# Patient Record
Sex: Female | Born: 1935
Health system: Southern US, Community
[De-identification: ages and names within clinical notes are randomized; demographics above are authoritative.]

## PROBLEM LIST (undated history)

## (undated) DIAGNOSIS — I10 Essential (primary) hypertension: Secondary | ICD-10-CM

## (undated) DIAGNOSIS — I639 Cerebral infarction, unspecified: Secondary | ICD-10-CM

## (undated) DIAGNOSIS — E079 Disorder of thyroid, unspecified: Secondary | ICD-10-CM

---

## 1998-03-30 ENCOUNTER — Other Ambulatory Visit: Admission: RE | Admit: 1998-03-30 | Discharge: 1998-03-30 | Payer: Self-pay | Admitting: Internal Medicine

## 1998-05-13 ENCOUNTER — Ambulatory Visit (HOSPITAL_COMMUNITY): Admission: RE | Admit: 1998-05-13 | Discharge: 1998-05-13 | Payer: Self-pay | Admitting: Ophthalmology

## 1999-08-18 ENCOUNTER — Encounter: Admission: RE | Admit: 1999-08-18 | Discharge: 1999-08-18 | Payer: Self-pay | Admitting: Internal Medicine

## 1999-09-01 ENCOUNTER — Encounter: Admission: RE | Admit: 1999-09-01 | Discharge: 1999-09-01 | Payer: Self-pay | Admitting: Internal Medicine

## 2000-04-24 ENCOUNTER — Other Ambulatory Visit: Admission: RE | Admit: 2000-04-24 | Discharge: 2000-04-24 | Payer: Self-pay | Admitting: Internal Medicine

## 2000-08-19 ENCOUNTER — Encounter: Admission: RE | Admit: 2000-08-19 | Discharge: 2000-08-19 | Payer: Self-pay | Admitting: Internal Medicine

## 2001-09-05 ENCOUNTER — Encounter: Payer: Self-pay | Admitting: Internal Medicine

## 2001-09-05 ENCOUNTER — Encounter: Admission: RE | Admit: 2001-09-05 | Discharge: 2001-09-05 | Payer: Self-pay | Admitting: Internal Medicine

## 2002-09-08 ENCOUNTER — Encounter: Admission: RE | Admit: 2002-09-08 | Discharge: 2002-09-08 | Payer: Self-pay | Admitting: Internal Medicine

## 2003-09-28 ENCOUNTER — Encounter: Admission: RE | Admit: 2003-09-28 | Discharge: 2003-09-28 | Payer: Self-pay | Admitting: Internal Medicine

## 2004-11-17 ENCOUNTER — Encounter: Admission: RE | Admit: 2004-11-17 | Discharge: 2004-11-17 | Payer: Self-pay | Admitting: Internal Medicine

## 2005-12-21 ENCOUNTER — Encounter: Admission: RE | Admit: 2005-12-21 | Discharge: 2005-12-21 | Payer: Self-pay | Admitting: Internal Medicine

## 2005-12-21 IMAGING — MG MM MAMMO SCREENING
5 series · 5 of 5 positions shown · non-contrast
Comparison: none

SCREENING MAMMOGRAM:
There is a fibroglandular pattern.  No masses or malignant type calcifications are identified.  
Compared with prior studies.

[R CC (1 of 2)]
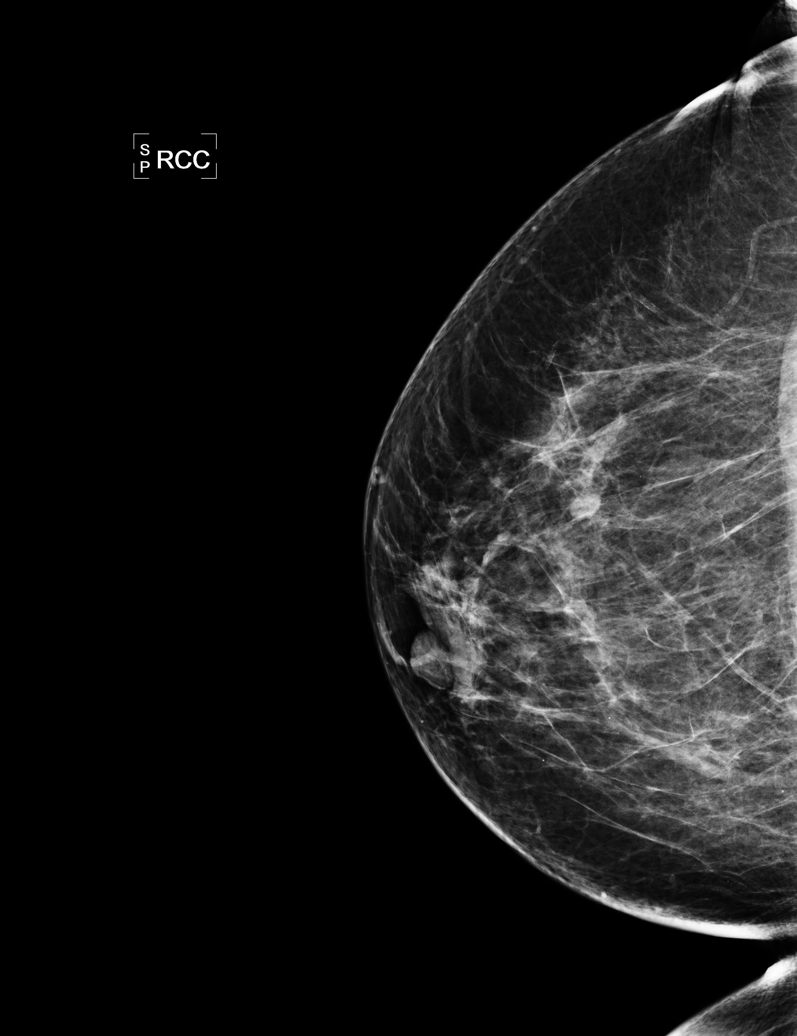

[L CC]
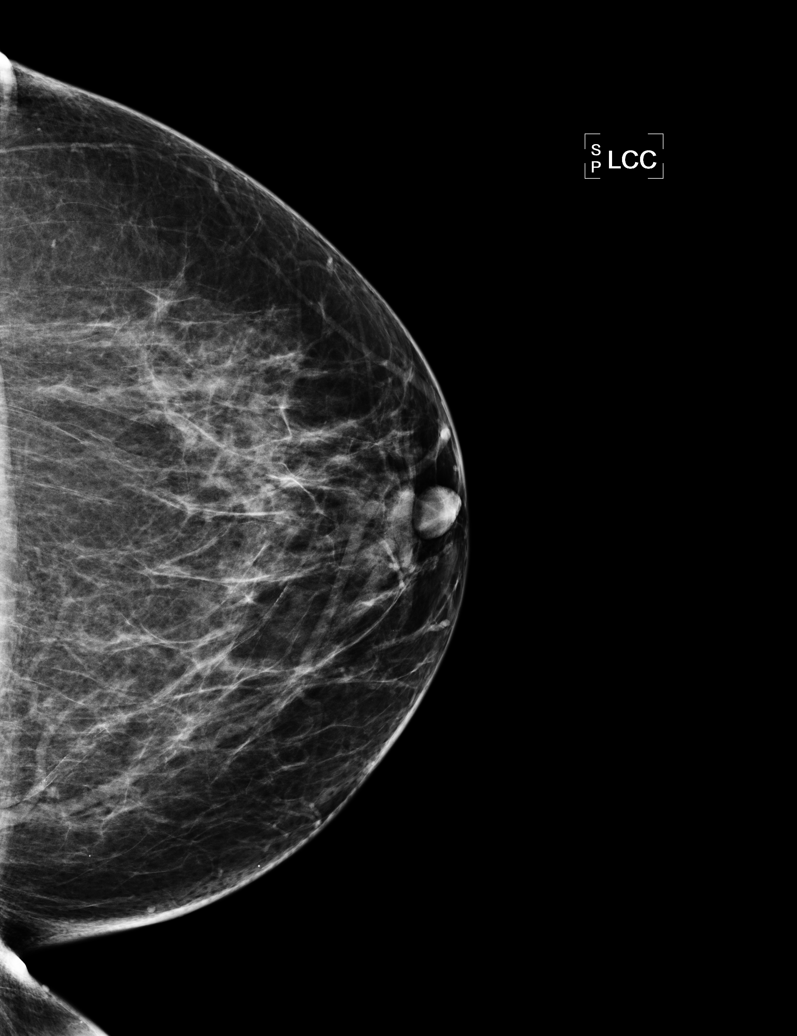

[L MLO]
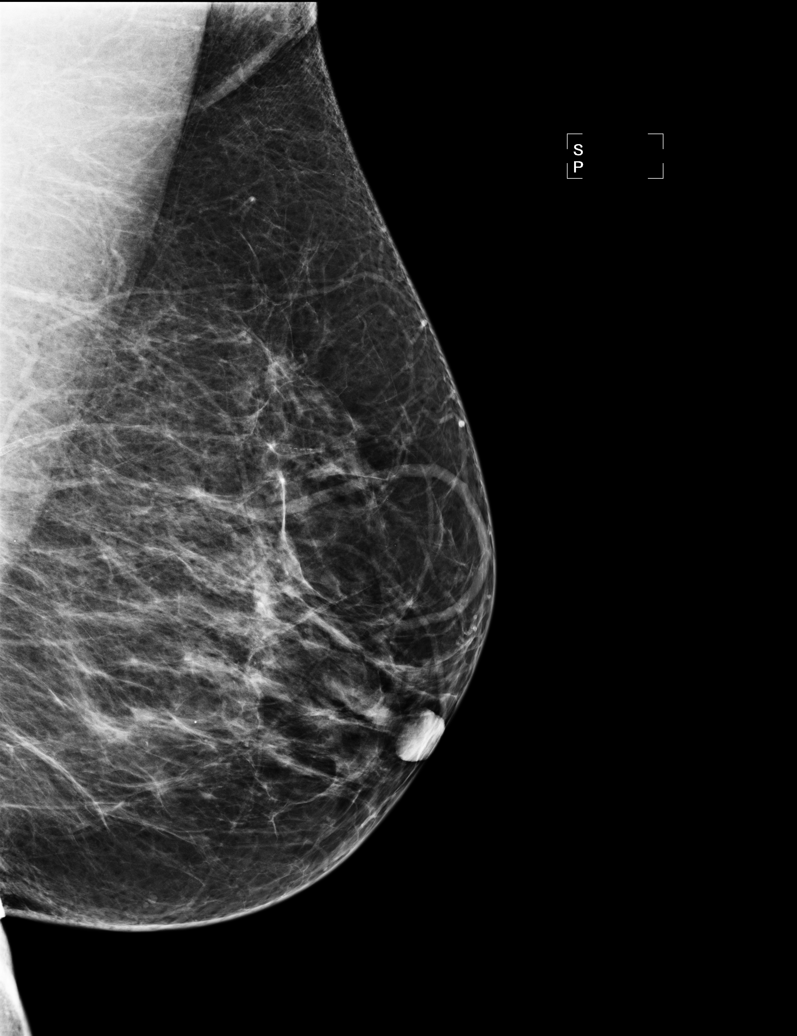

[R MLO]
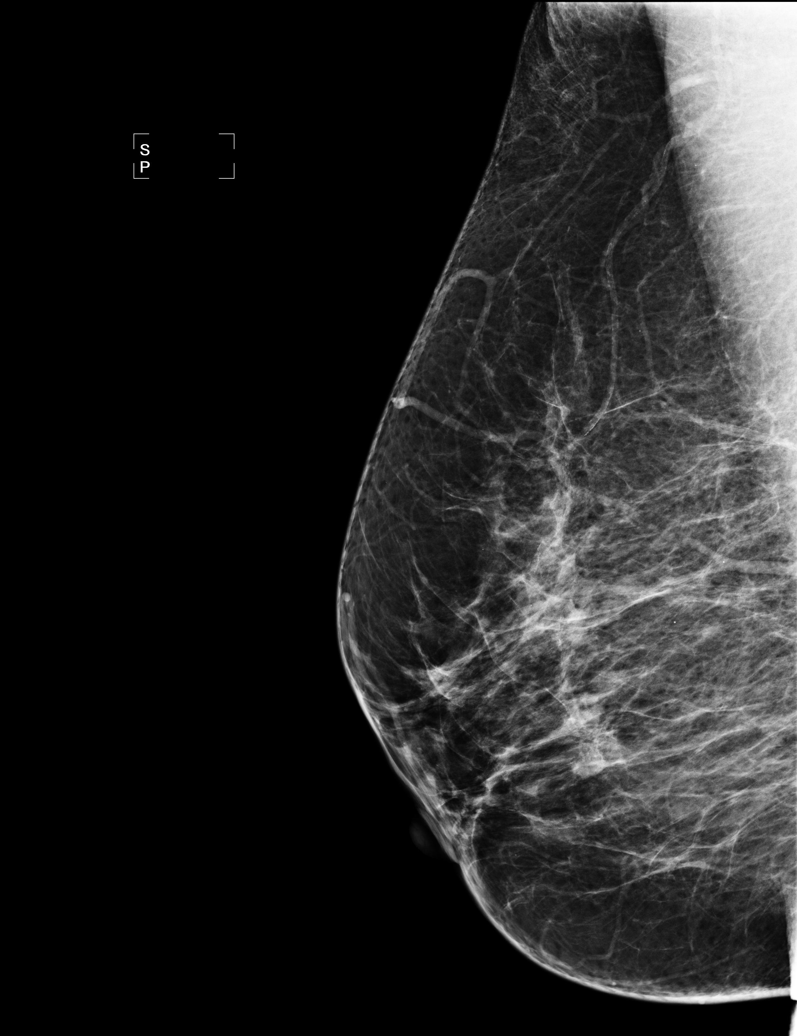

[R CC (2 of 2)]
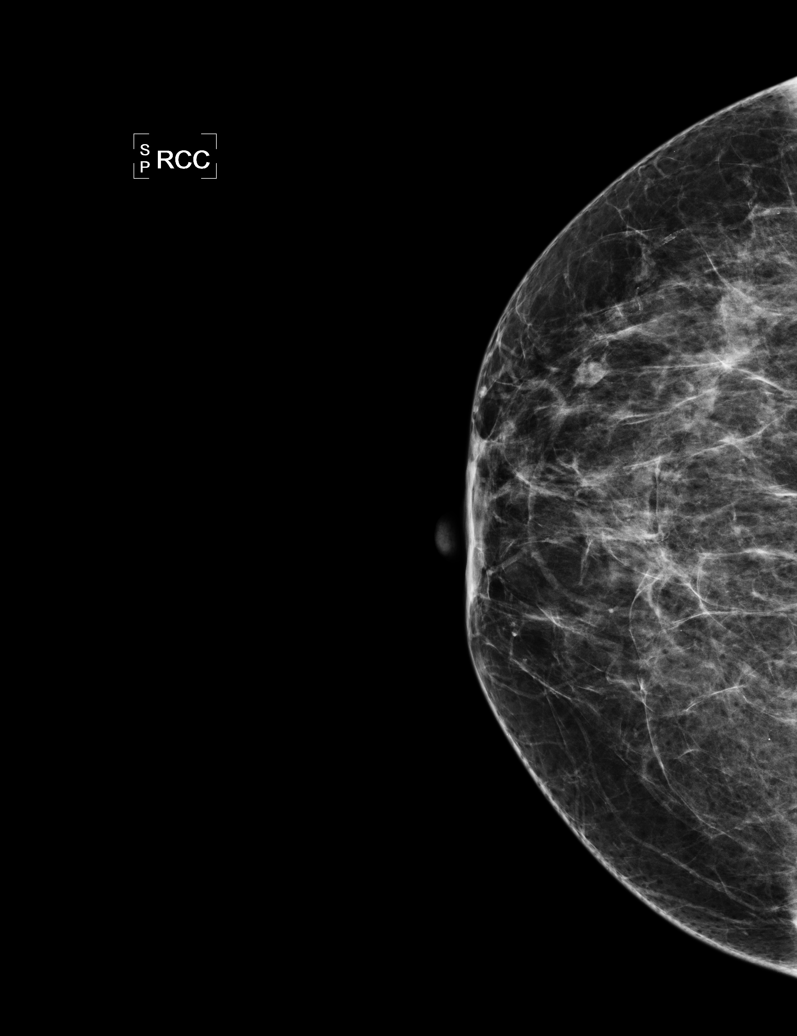

[5 of 5 positions shown; findings below may reference images not displayed]

IMPRESSION: No specific mammographic evidence of malignancy.  Next screening mammogram is recommended in one 
year.

ASSESSMENT: Negative - BI-RADS 1

Screening mammogram in 1 year.

## 2007-01-10 ENCOUNTER — Encounter: Admission: RE | Admit: 2007-01-10 | Discharge: 2007-01-10 | Payer: Self-pay | Admitting: Internal Medicine

## 2007-01-10 IMAGING — MG MM SCREEN MAMMOGRAM BILATERAL
6 series · 6 of 6 positions shown · non-contrast
Comparison: none

DG SCREEN MAMMOGRAM BILATERAL
Bilateral CC and MLO view(s) were taken.
Technologist: NOEMIE

DIGITAL SCREENING MAMMOGRAM WITH CAD:
There is a fibroglandular pattern.  No masses or malignant type calcifications are identified.  
Compared with prior studies.

[R CC]
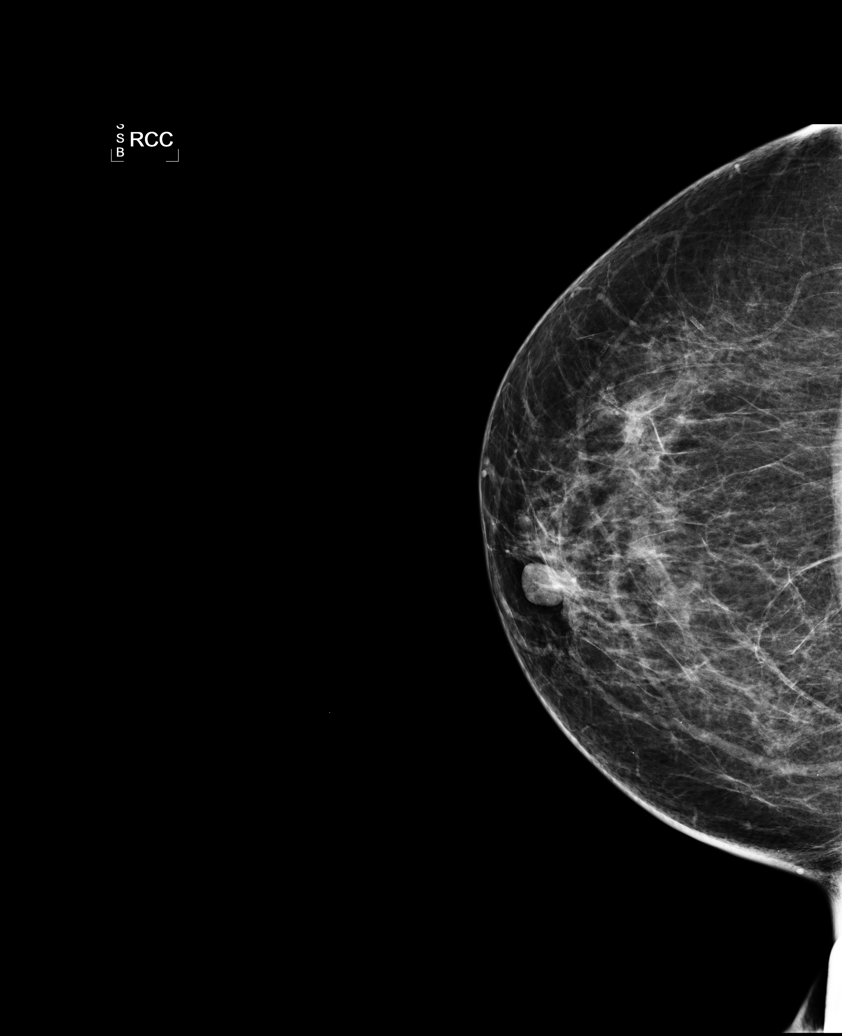

[L CC]
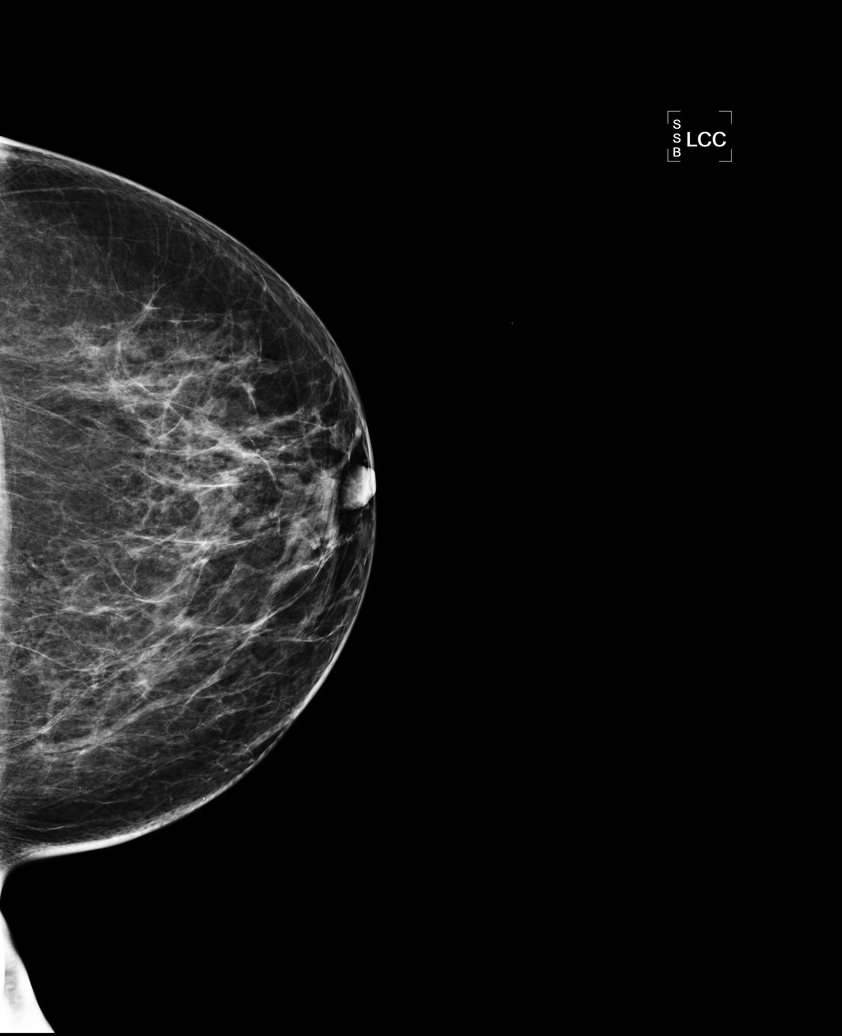

[L MLO (1 of 2)]
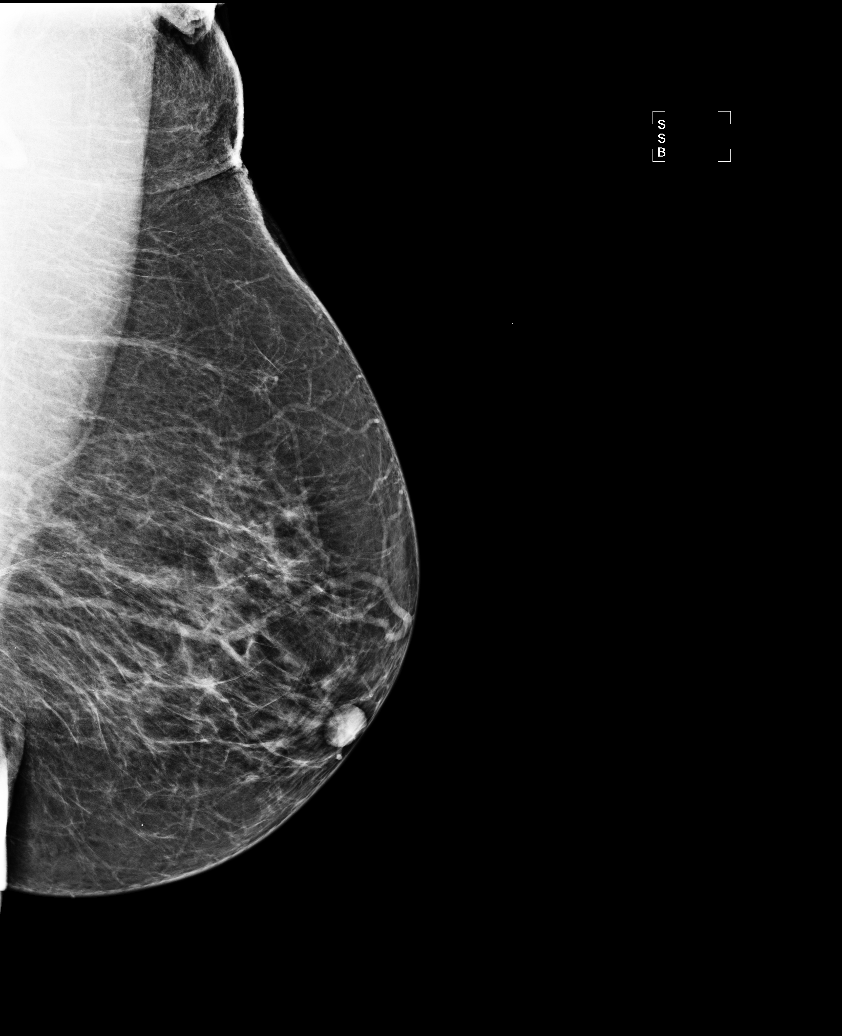

[R MLO (1 of 2)]
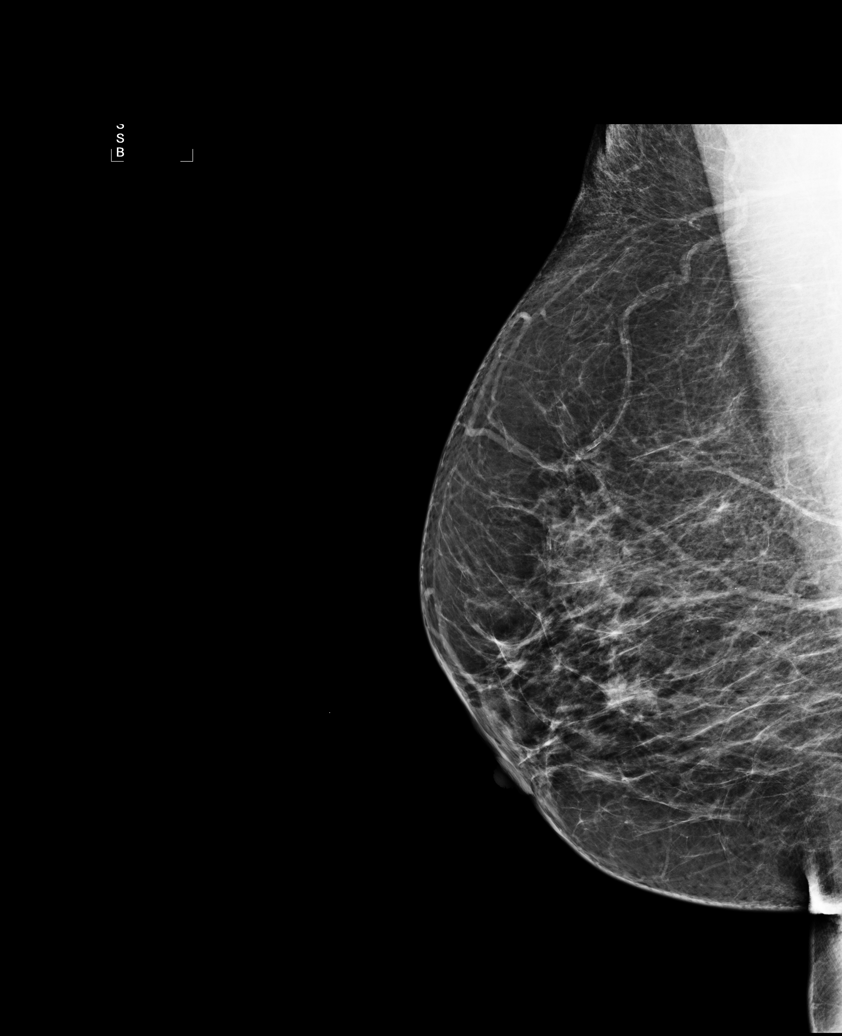

[L MLO (2 of 2)]
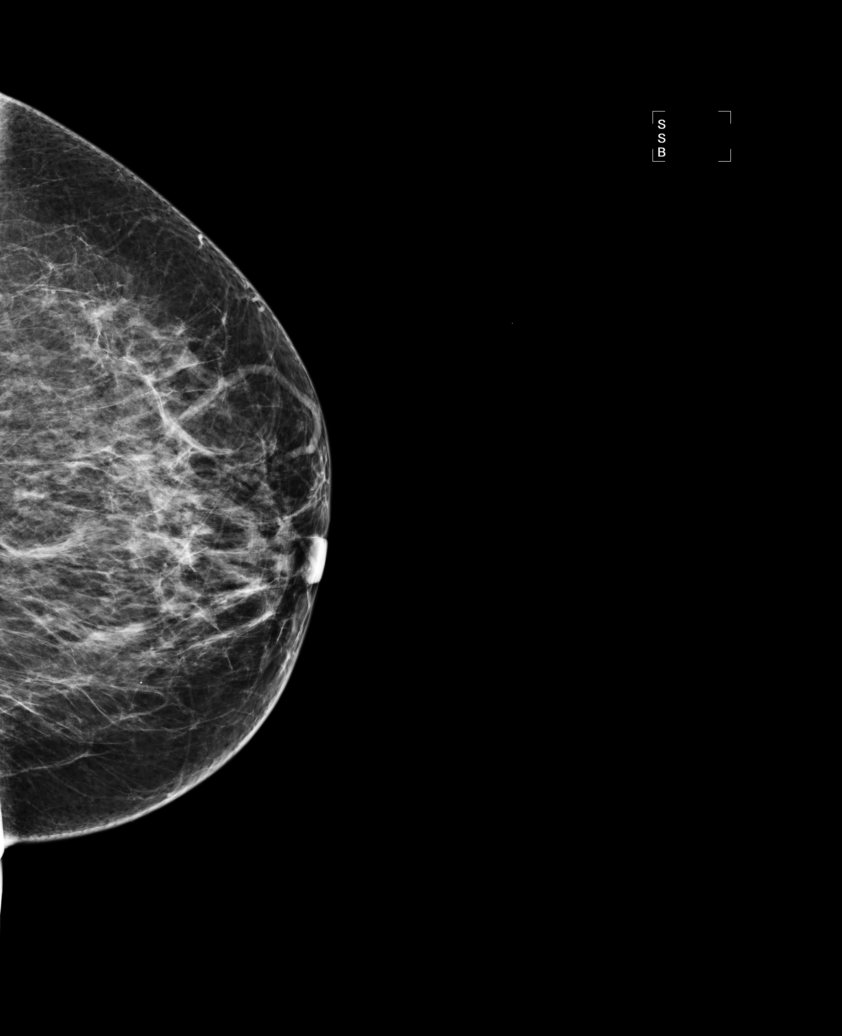

[R MLO (2 of 2)]
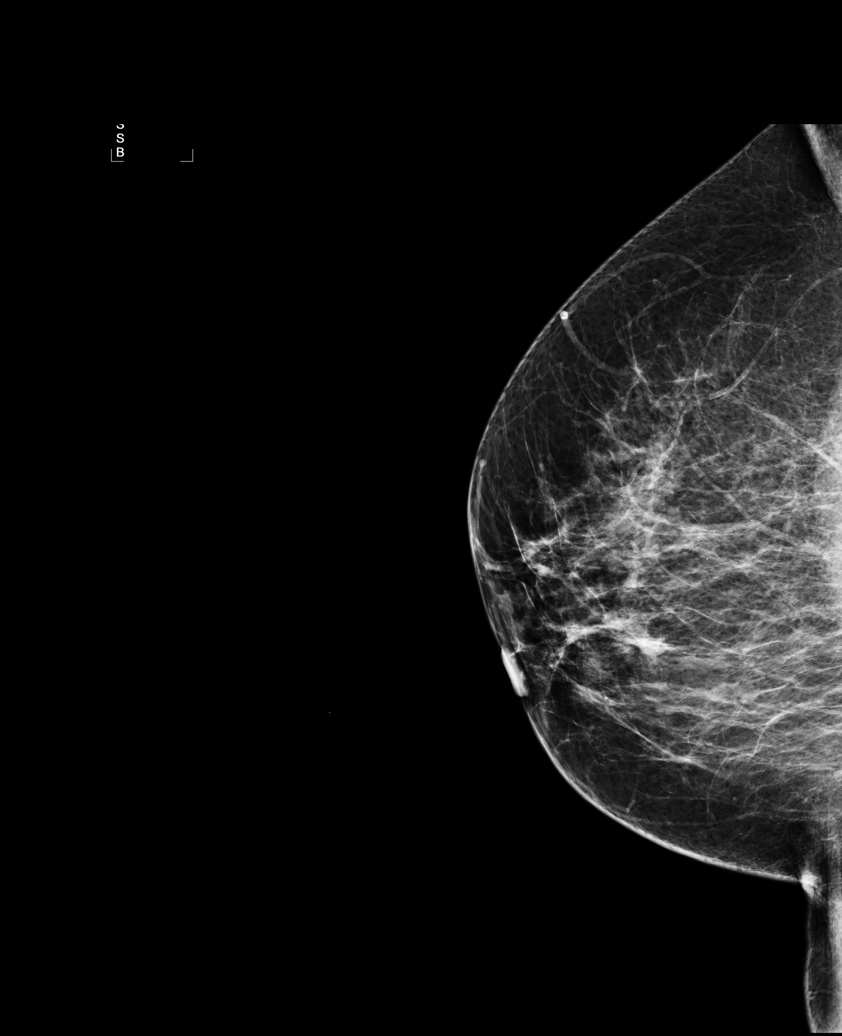

[6 of 6 positions shown; findings below may reference images not displayed]

IMPRESSION: No specific mammographic evidence of malignancy.  Next screening mammogram is recommended in one 
year.

ASSESSMENT: Negative - BI-RADS 1

Screening mammogram in 1 year.
ANALYZED BY COMPUTER AIDED DETECTION. , THIS PROCEDURE WAS A DIGITAL MAMMOGRAM.

## 2007-07-07 ENCOUNTER — Emergency Department (HOSPITAL_COMMUNITY): Admission: EM | Admit: 2007-07-07 | Discharge: 2007-07-07 | Payer: Self-pay | Admitting: Emergency Medicine
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
## 2007-09-01 ENCOUNTER — Encounter: Admission: RE | Admit: 2007-09-01 | Discharge: 2007-11-30 | Payer: Self-pay | Admitting: Orthopedic Surgery

## 2008-01-23 ENCOUNTER — Encounter: Admission: RE | Admit: 2008-01-23 | Discharge: 2008-01-23 | Payer: Self-pay | Admitting: Internal Medicine

## 2008-01-23 IMAGING — MG MM SCREEN MAMMOGRAM BILATERAL
4 series · 4 of 4 positions shown · non-contrast
Comparison: Prior studies.

DG SCREEN MAMMOGRAM BILATERAL
Bilateral CC and MLO view(s) were taken.

DIGITAL SCREENING MAMMOGRAM WITH CAD:

[R CC]
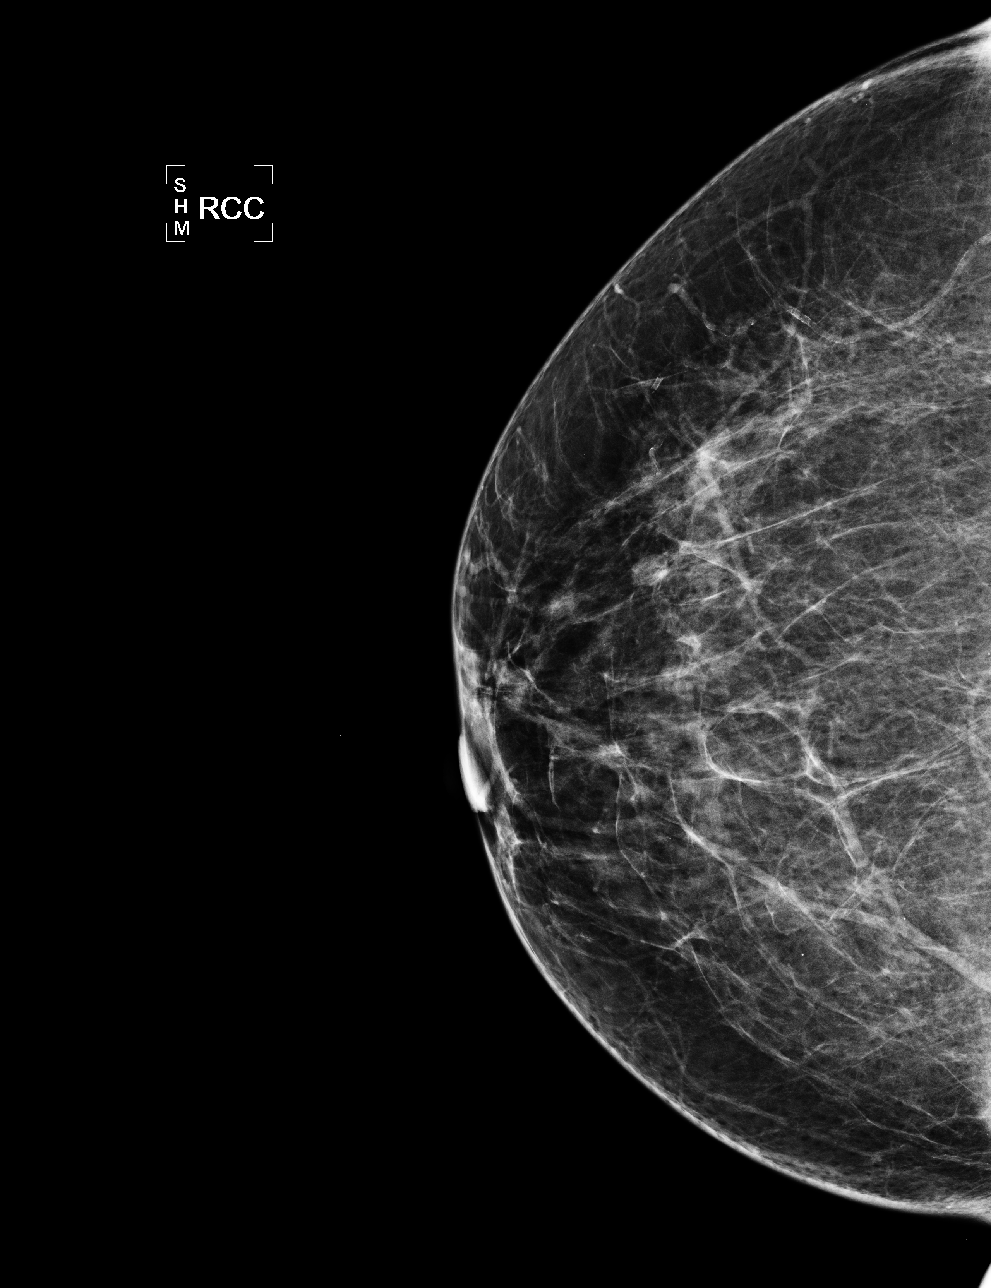

[L CC]
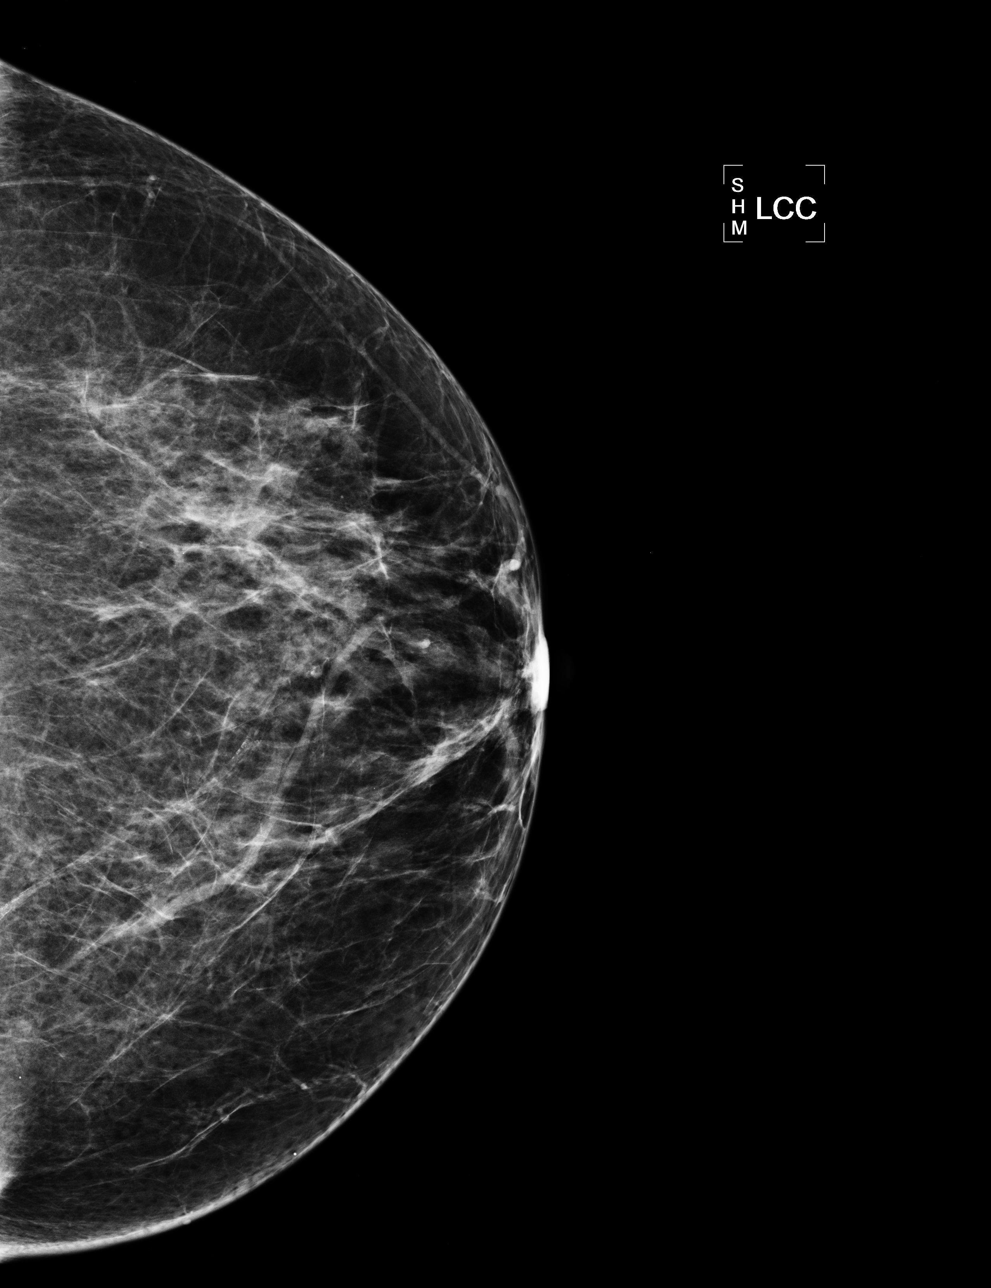

[L MLO]
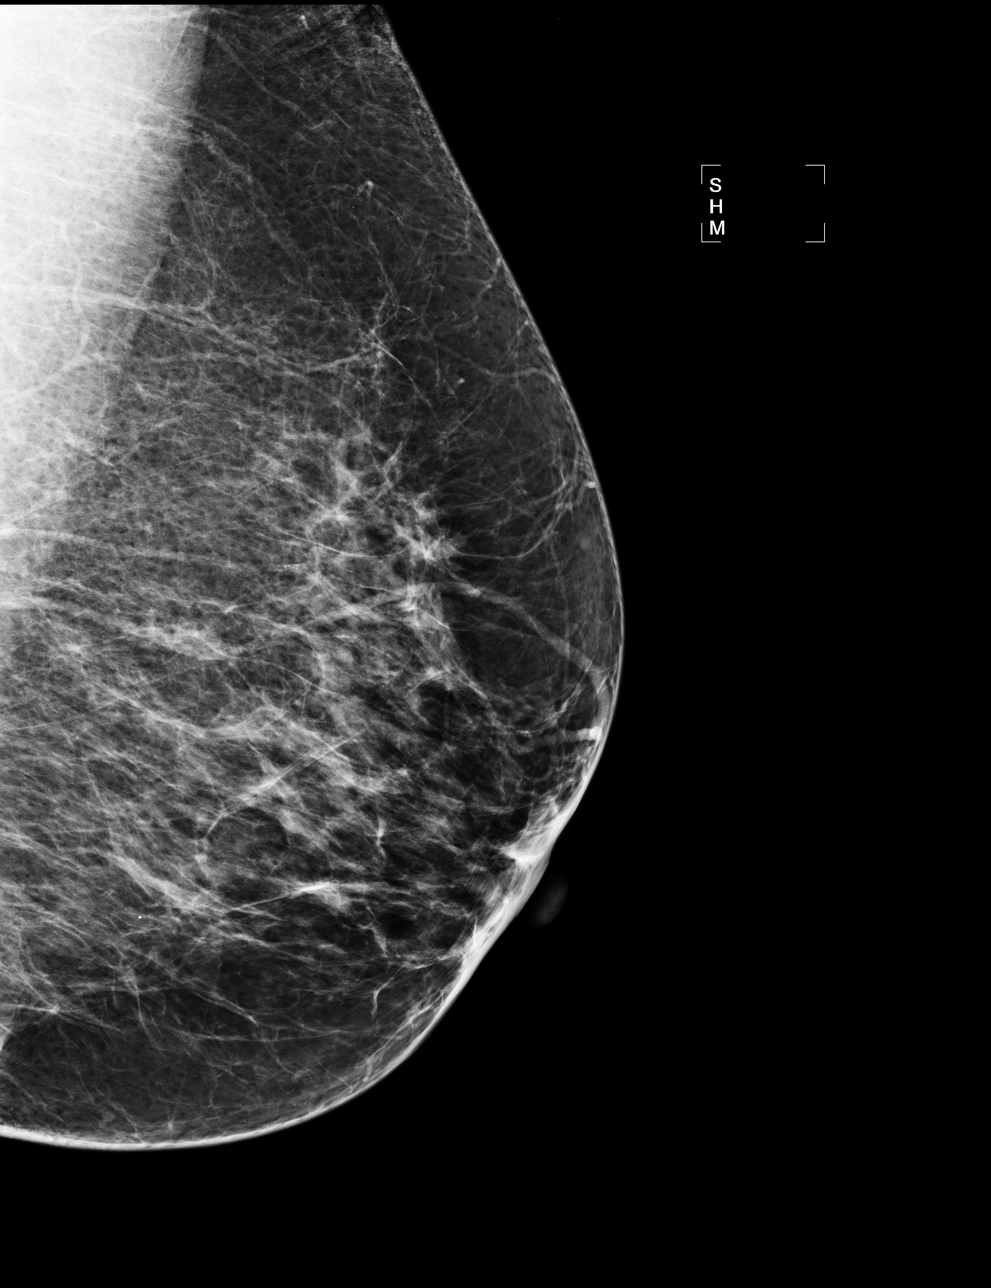

[R MLO]
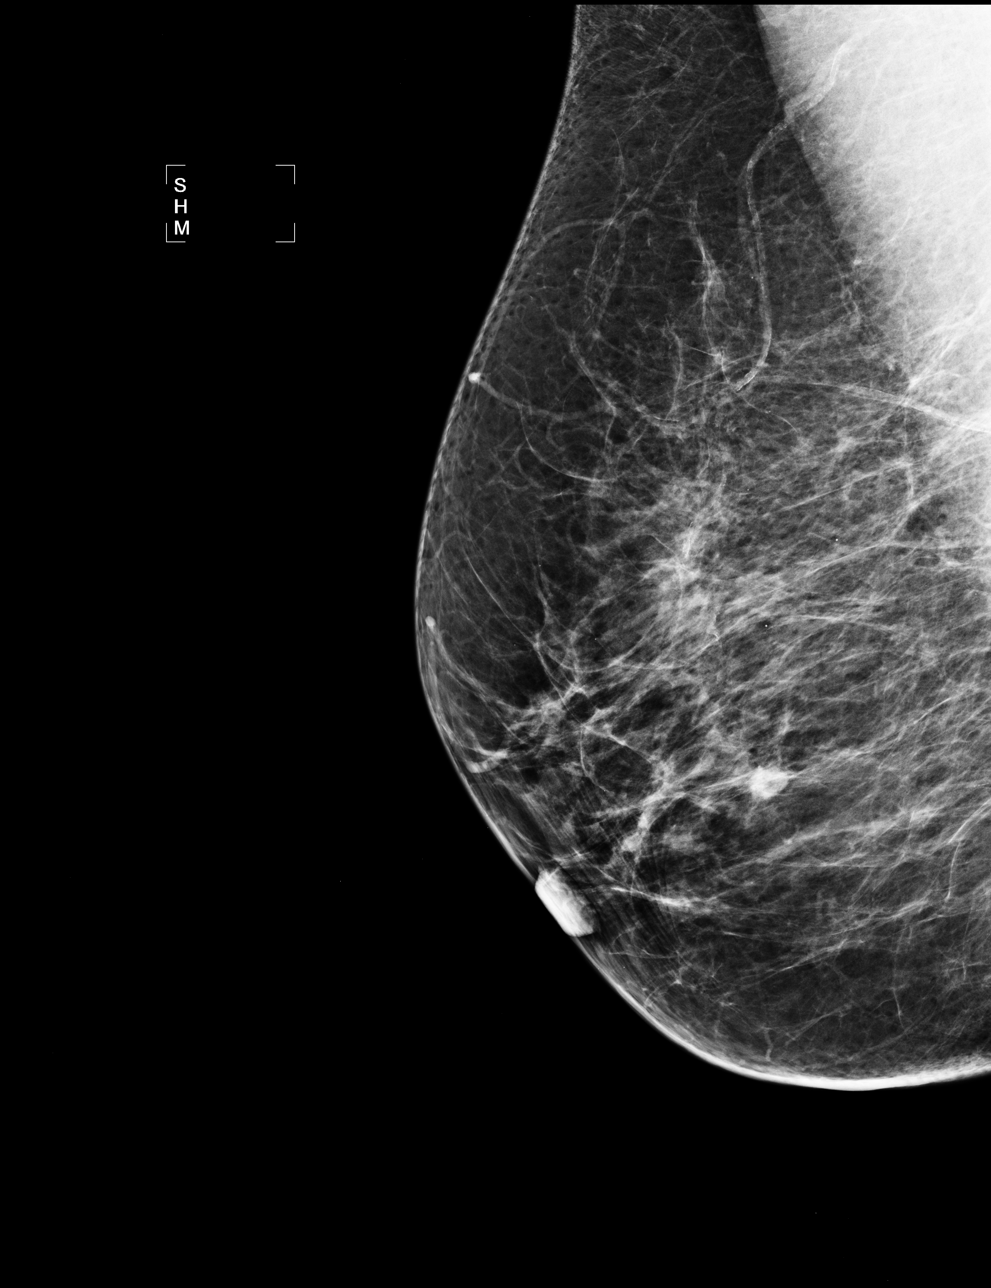

[4 of 4 positions shown; findings below may reference images not displayed]

There are scattered fibroglandular densities.  There is no dominant mass, architectural distortion 
or calcification to suggest malignancy.
IMPRESSION: No mammographic evidence of malignancy.  Suggest yearly screening mammography.

ASSESSMENT: Negative - BI-RADS 1

Screening mammogram in 1 year.
ANALYZED BY COMPUTER AIDED DETECTION. , THIS PROCEDURE WAS A DIGITAL MAMMOGRAM.

## 2009-02-11 ENCOUNTER — Encounter: Admission: RE | Admit: 2009-02-11 | Discharge: 2009-02-11 | Payer: Self-pay | Admitting: Internal Medicine

## 2009-02-11 IMAGING — MG MM DIGITAL SCREENING BILAT W/ CAD
4 series · 4 of 4 positions shown · non-contrast
Comparison: none

DG SCREEN MAMMOGRAM BILATERAL
Bilateral CC and MLO view(s) were taken.

DIGITAL SCREENING MAMMOGRAM WITH CAD:
There are scattered fibroglandular densities.  No masses or malignant type calcifications are 
identified.  Compared with prior studies.

[R CC]
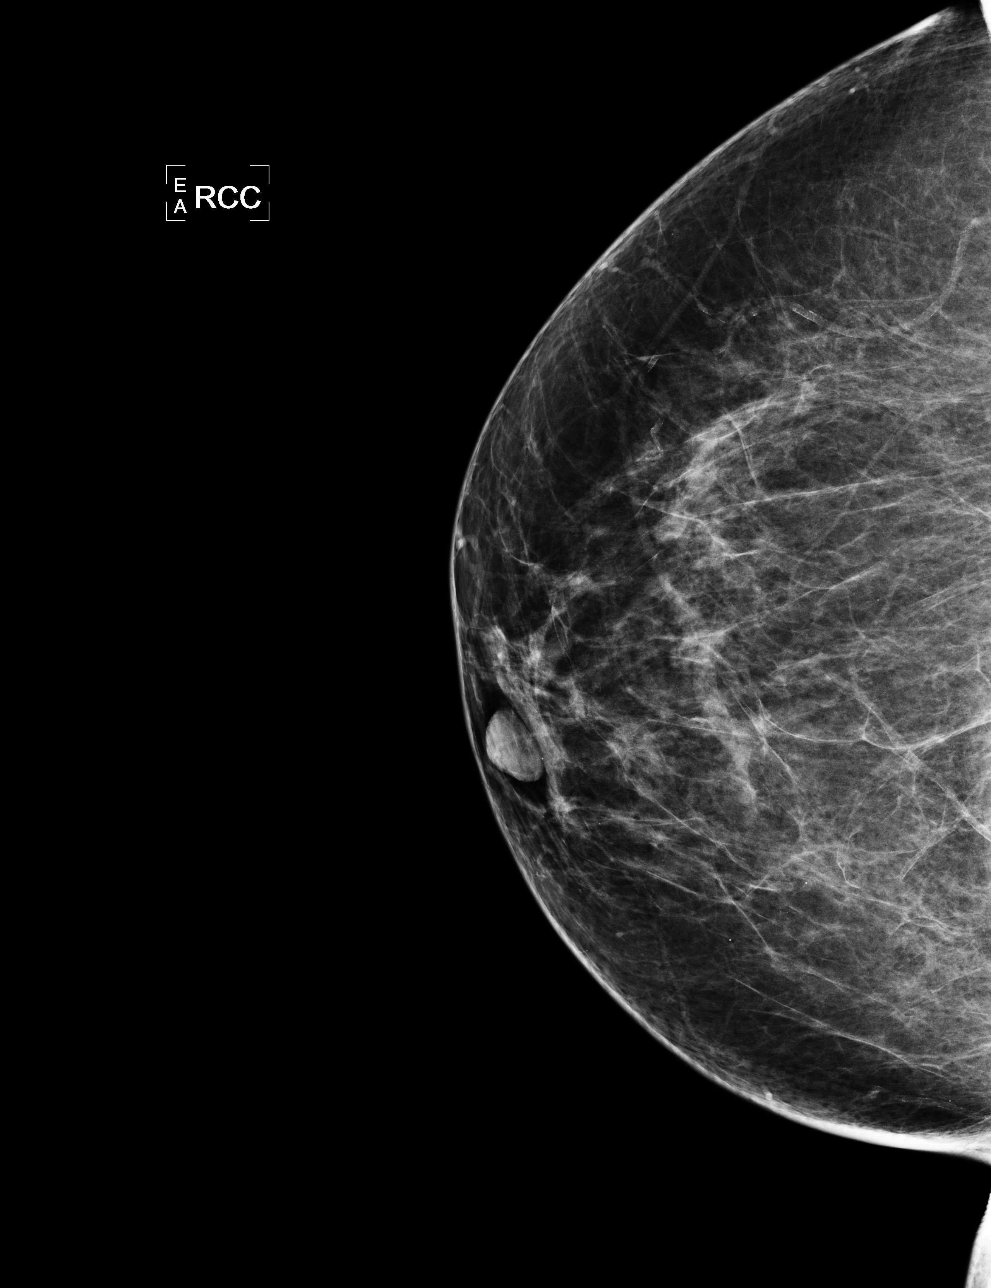

[L CC]
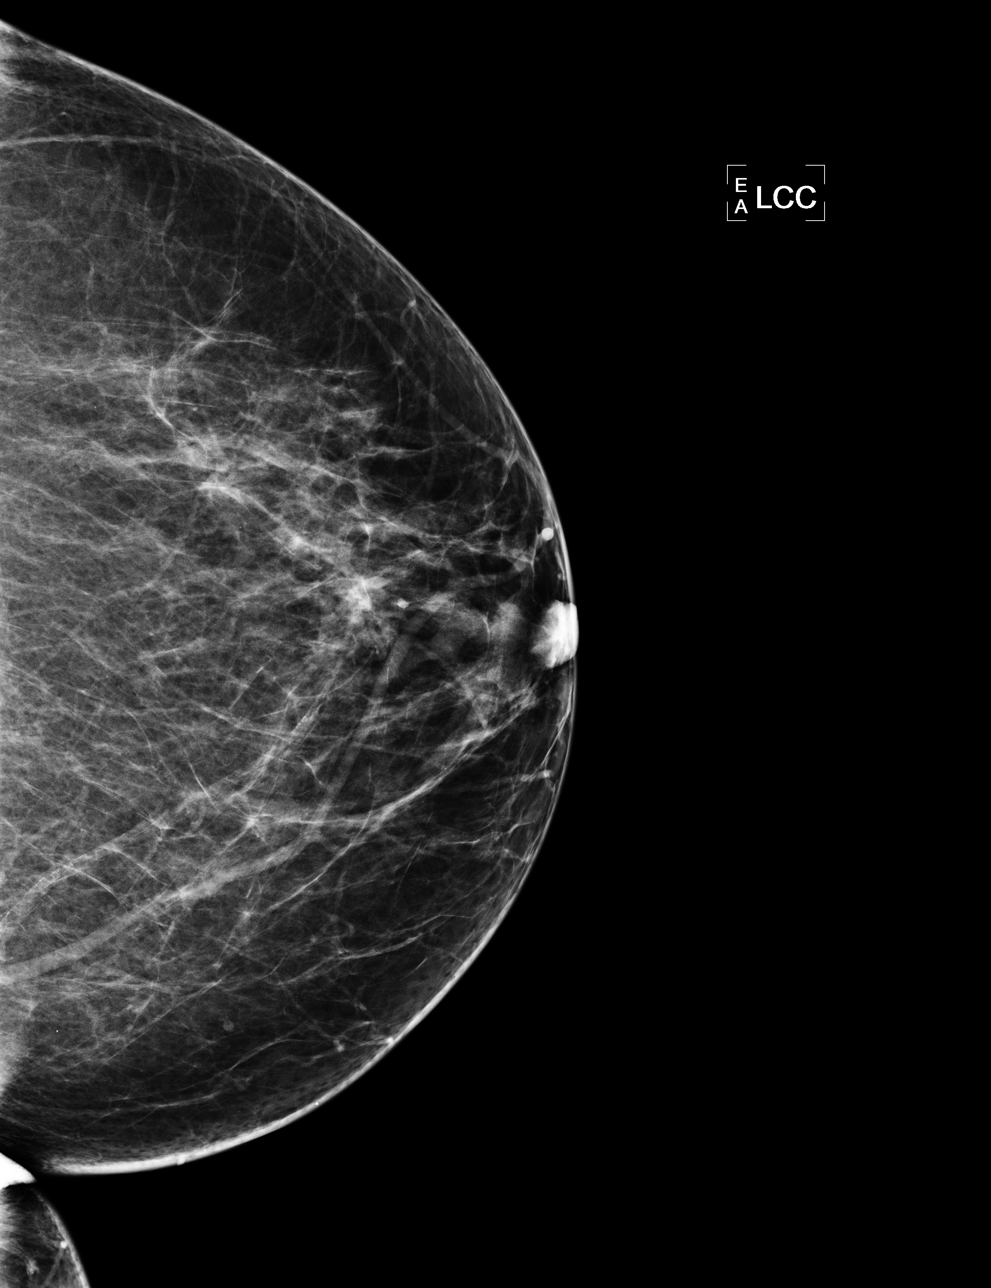

[L MLO]
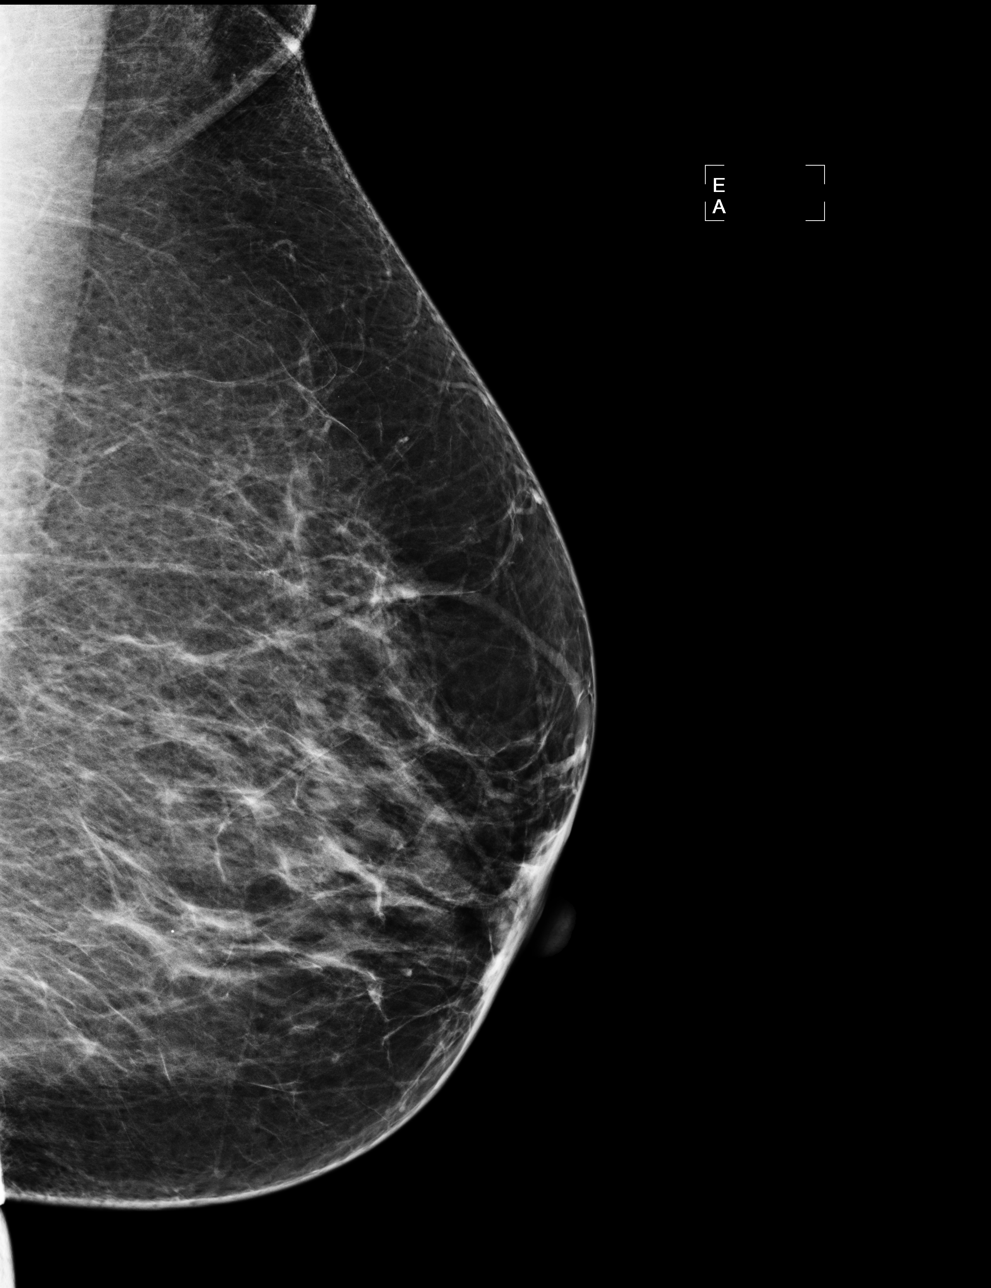

[R MLO]
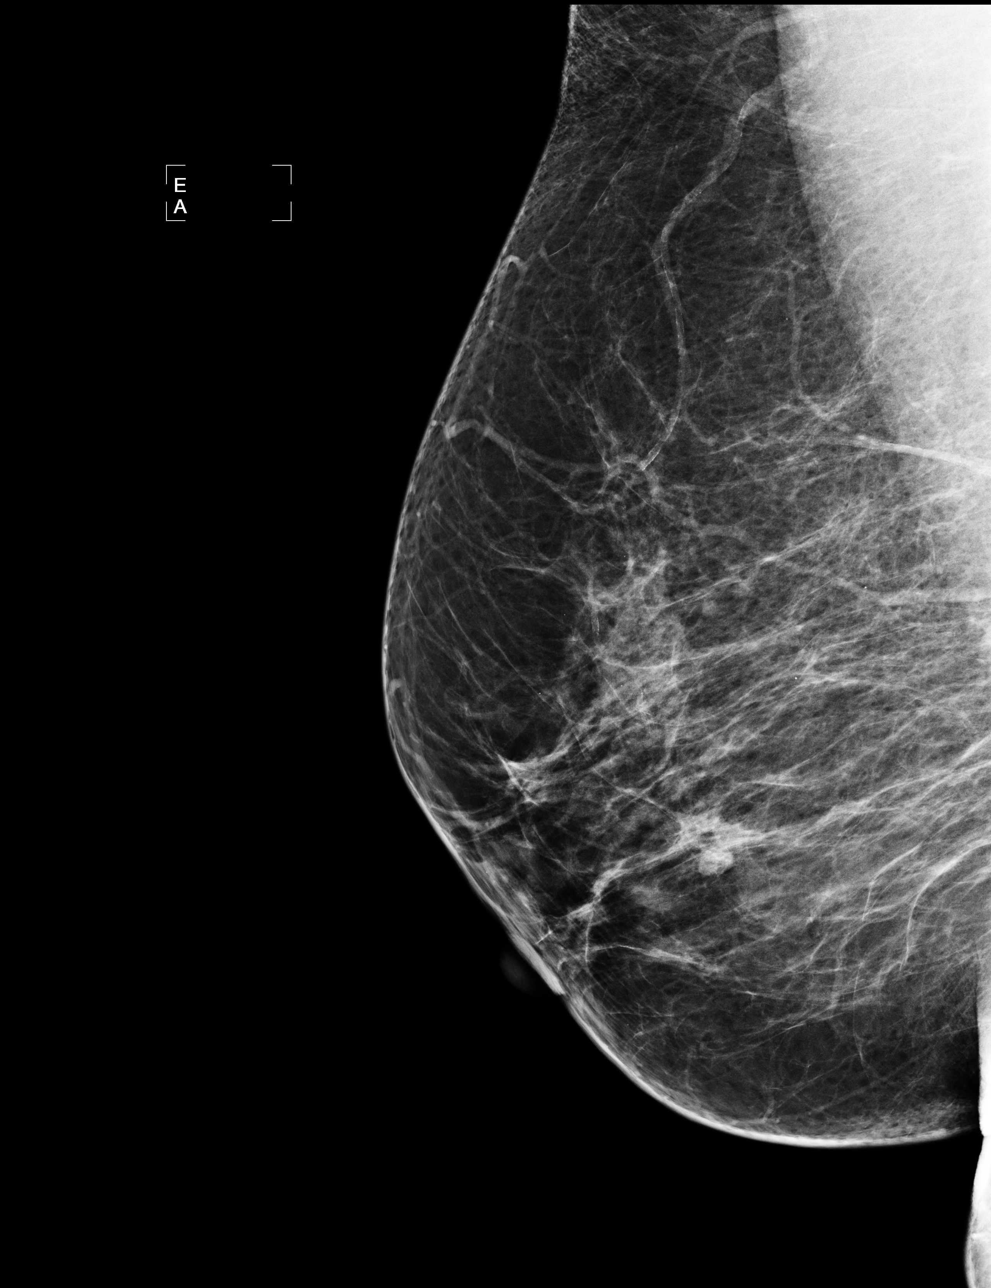

[4 of 4 positions shown; findings below may reference images not displayed]

IMPRESSION: No specific mammographic evidence of malignancy.  Next screening mammogram is recommended in one 
year.

A result letter of this screening mammogram will be mailed directly to the patient.

ASSESSMENT: Negative - BI-RADS 1

Screening mammogram in 1 year.
ANALYZED BY COMPUTER AIDED DETECTION. , THIS PROCEDURE WAS A DIGITAL MAMMOGRAM.

## 2009-09-09 ENCOUNTER — Encounter (INDEPENDENT_AMBULATORY_CARE_PROVIDER_SITE_OTHER): Payer: Self-pay | Admitting: *Deleted

## 2010-02-14 ENCOUNTER — Encounter: Admission: RE | Admit: 2010-02-14 | Discharge: 2010-02-14 | Payer: Self-pay | Admitting: Internal Medicine

## 2010-02-14 IMAGING — MG MM DIGITAL SCREENING
4 series · 4 of 4 positions shown · non-contrast
Comparison: none

DG SCREEN MAMMOGRAM BILATERAL
Bilateral CC and MLO view(s) were taken.
Technologist: BRUGGEMAN

DIGITAL SCREENING MAMMOGRAM WITH CAD:
The breast tissue is heterogeneously dense.  No masses or malignant type calcifications are 
identified.  Compared with prior studies.
Images were processed with CAD.

[R CC]
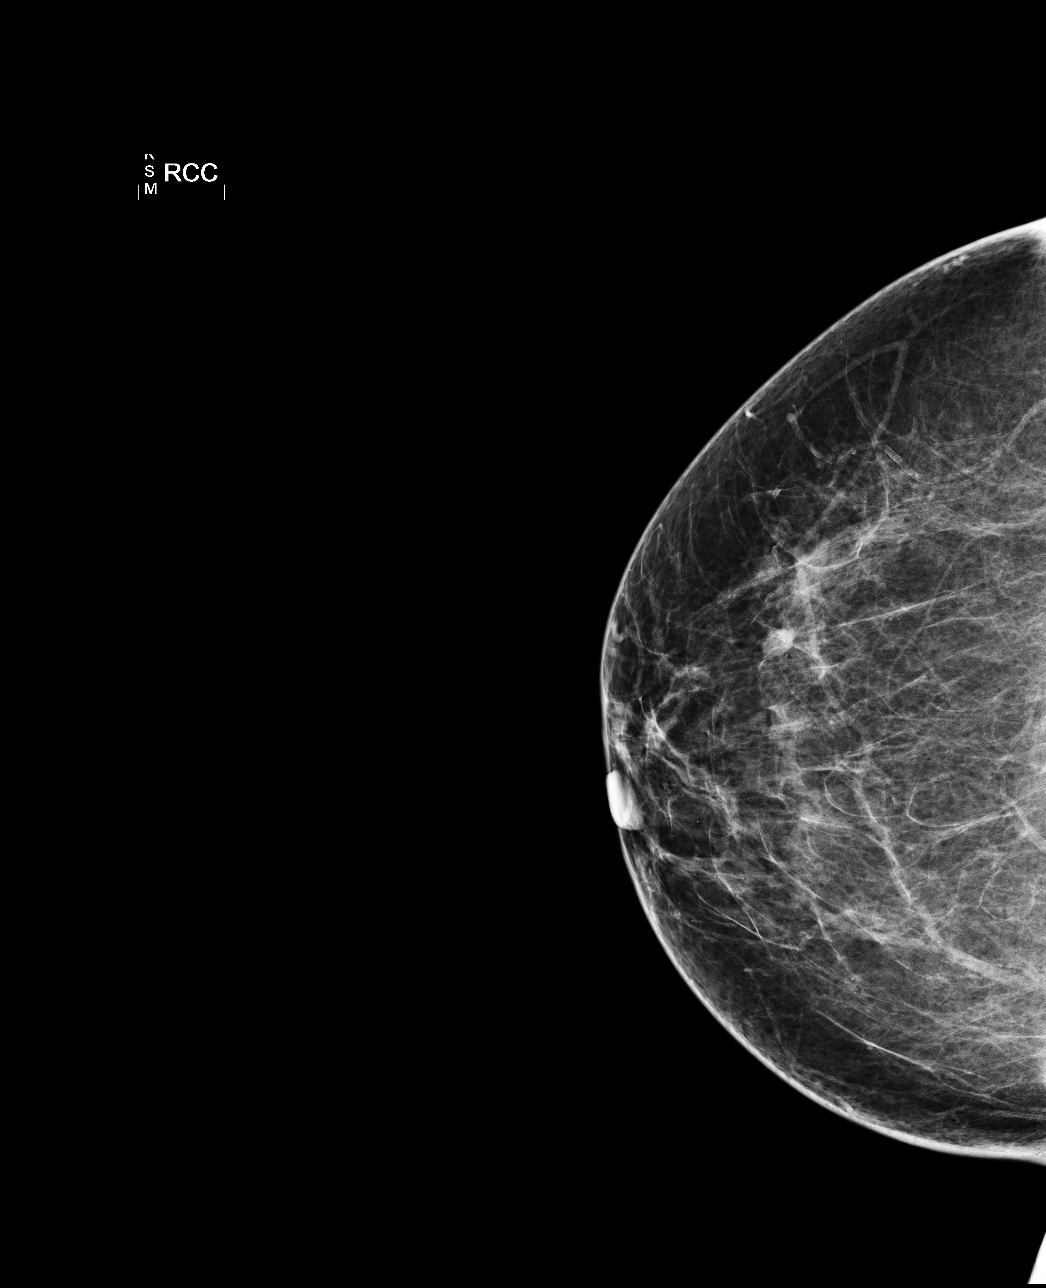

[L CC]
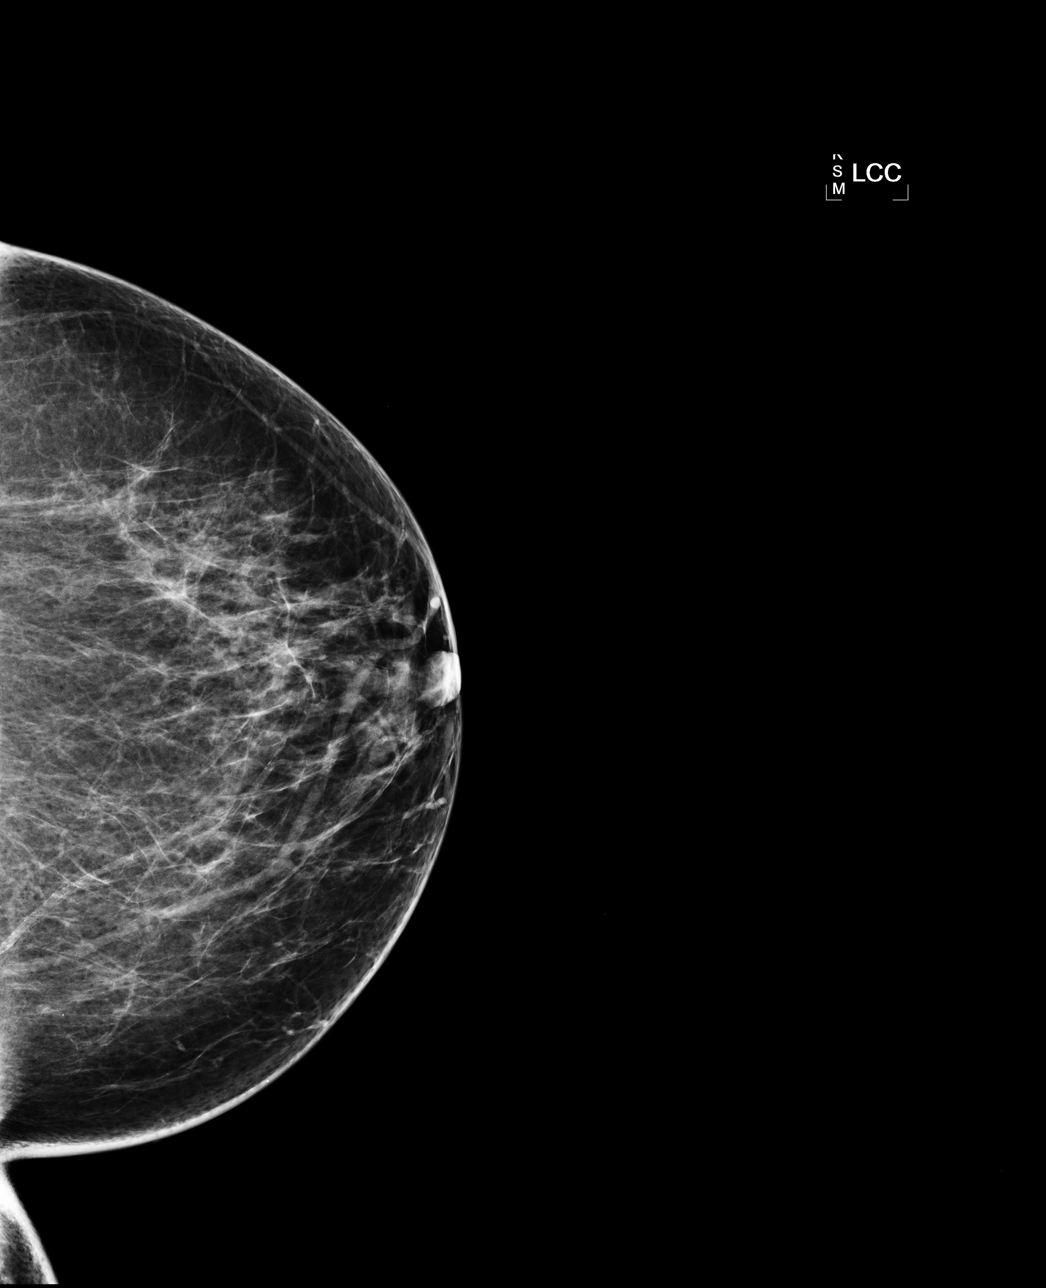

[L MLO]
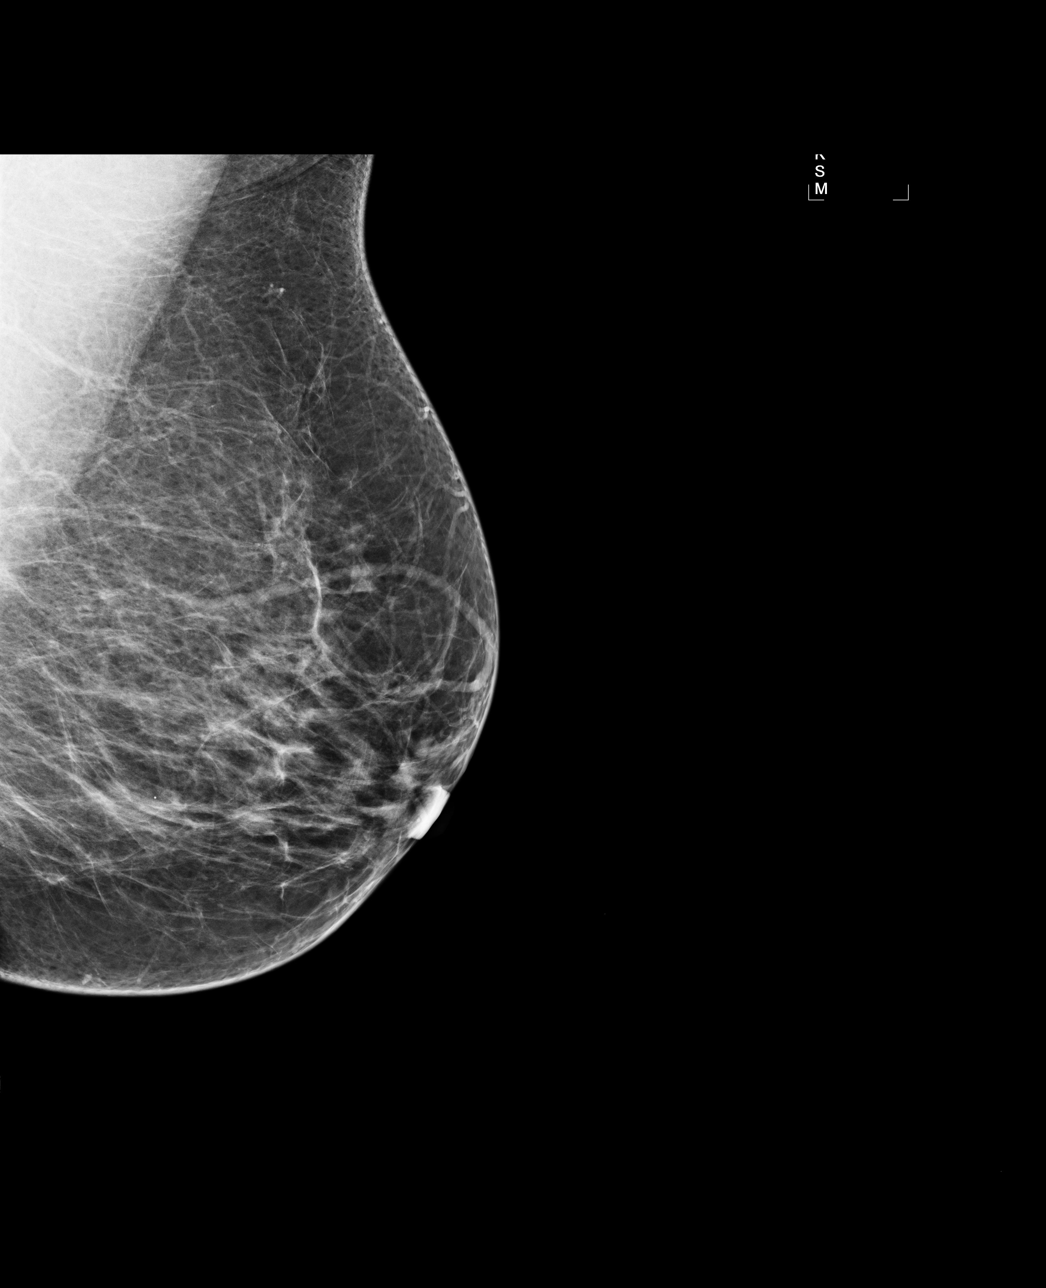

[R MLO]
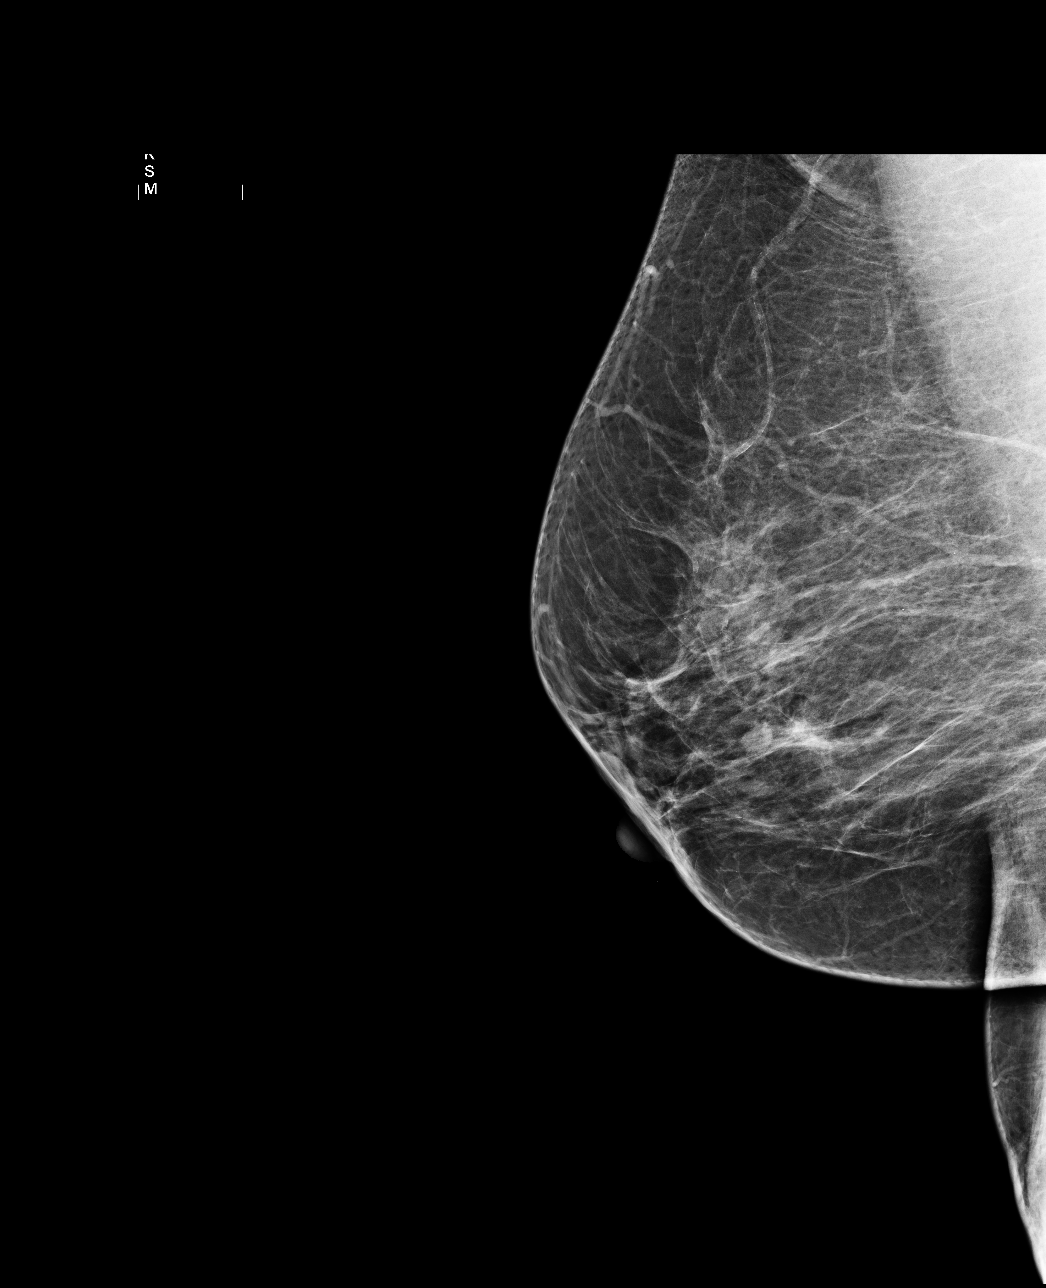

[4 of 4 positions shown; findings below may reference images not displayed]

IMPRESSION: No specific mammographic evidence of malignancy.  Next screening mammogram is recommended in one 
year.

A result letter of this screening mammogram will be mailed directly to the patient.

ASSESSMENT: Negative - BI-RADS 1

Screening mammogram in 1 year.
,
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
## 2012-04-17 ENCOUNTER — Other Ambulatory Visit: Payer: Self-pay | Admitting: Internal Medicine

## 2012-04-17 DIAGNOSIS — Z1231 Encounter for screening mammogram for malignant neoplasm of breast: Secondary | ICD-10-CM

## 2012-05-07 ENCOUNTER — Other Ambulatory Visit: Payer: Self-pay | Admitting: Internal Medicine

## 2012-05-07 ENCOUNTER — Ambulatory Visit
Admission: RE | Admit: 2012-05-07 | Discharge: 2012-05-07 | Disposition: A | Discharge status: Home / Self Care | Payer: Medicare Other | Source: Ambulatory Visit | Attending: Internal Medicine | Admitting: Internal Medicine

## 2012-05-07 DIAGNOSIS — Z1231 Encounter for screening mammogram for malignant neoplasm of breast: Secondary | ICD-10-CM

## 2012-05-07 IMAGING — MG MM DIGITAL SCREENING BILAT
5 series · 5 of 5 positions shown · non-contrast
Comparison: Previous exams

CLINICAL DATA: Screening.

DIGITAL SCREENING MAMMOGRAM WITH CAD

[R CC (1 of 2)]
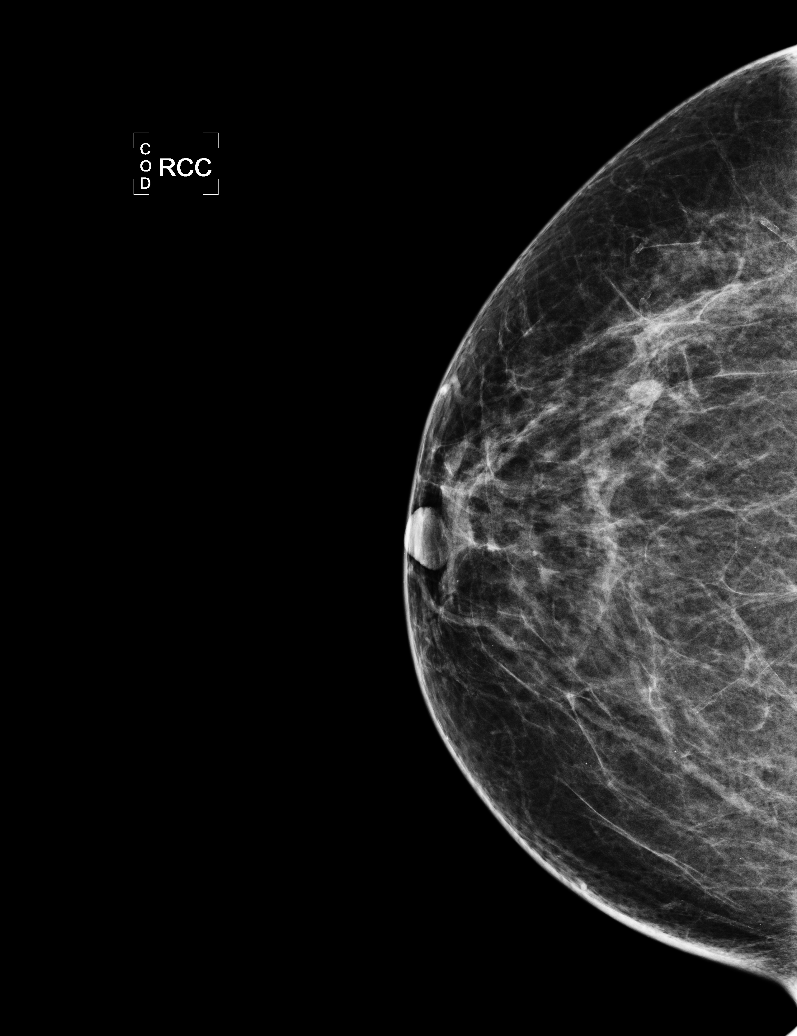

[L CC]
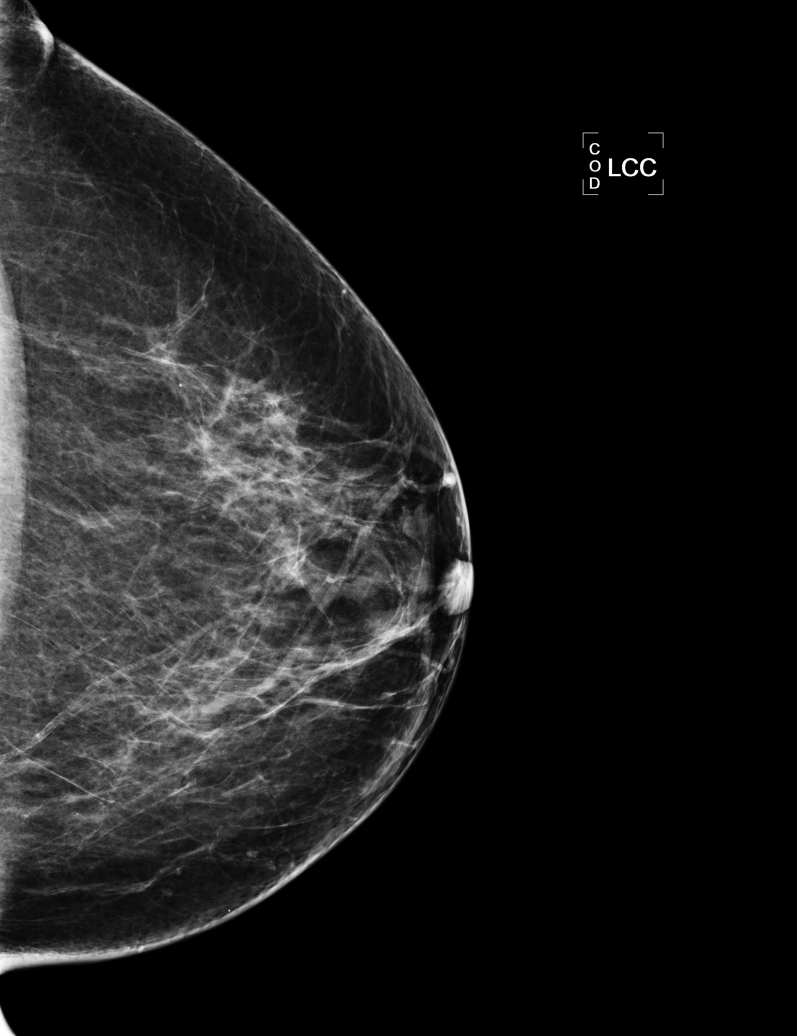

[L MLO]
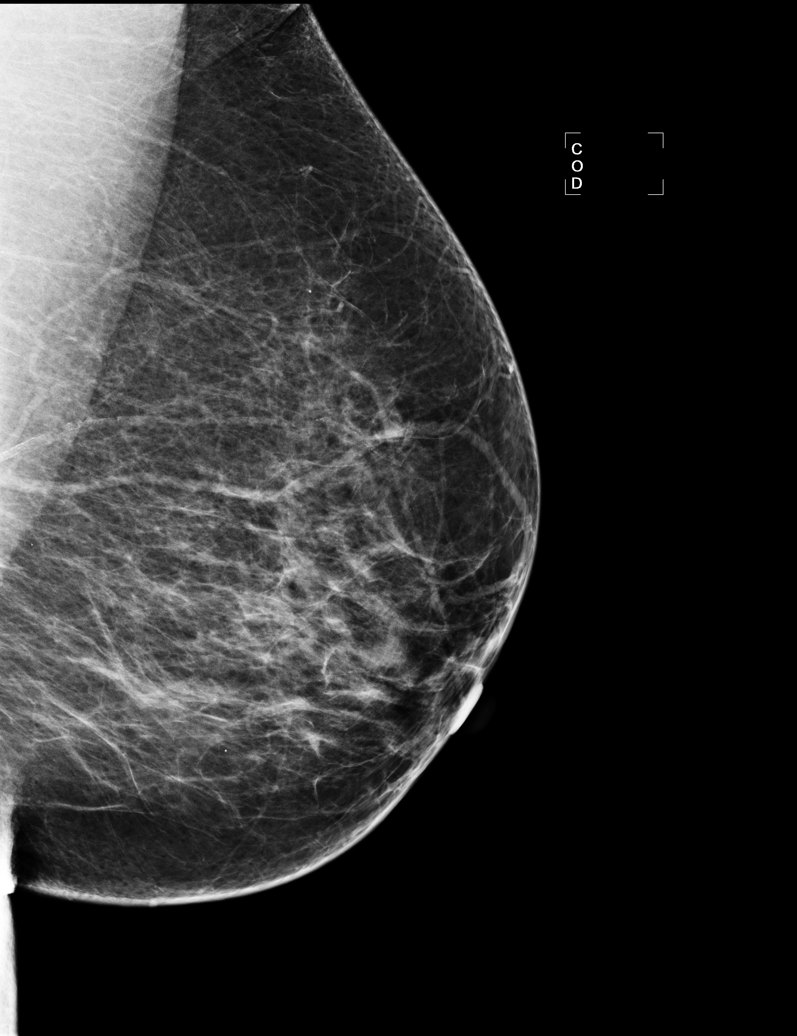

[R MLO]
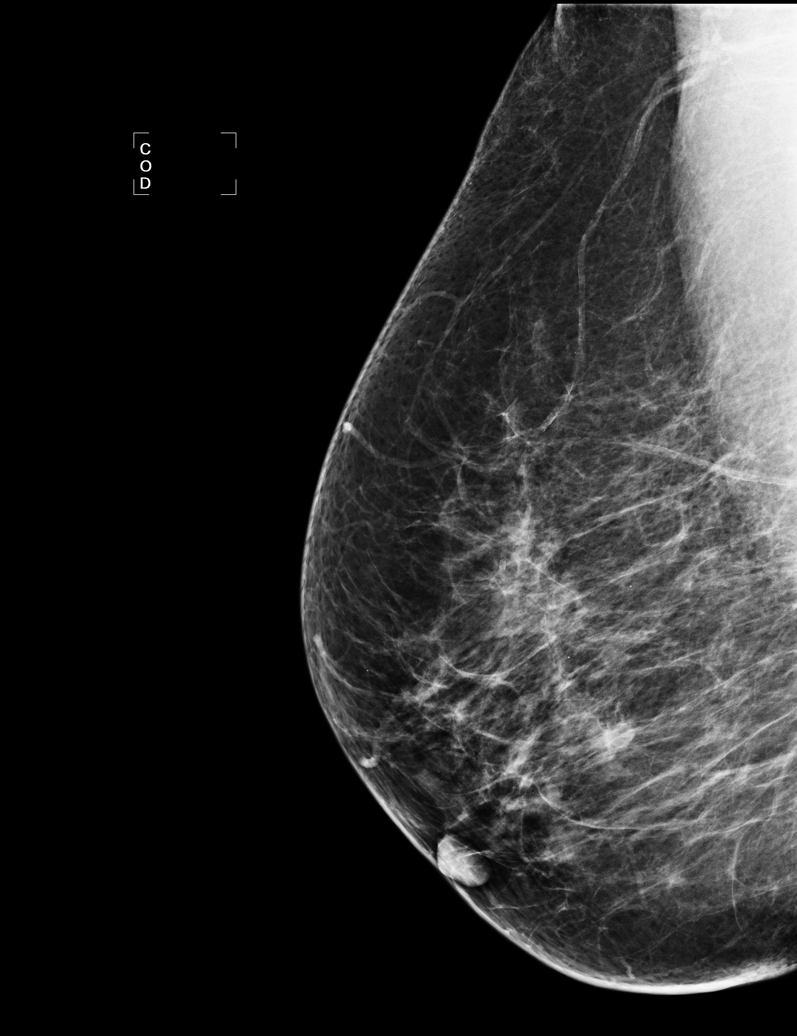

[R CC (2 of 2)]
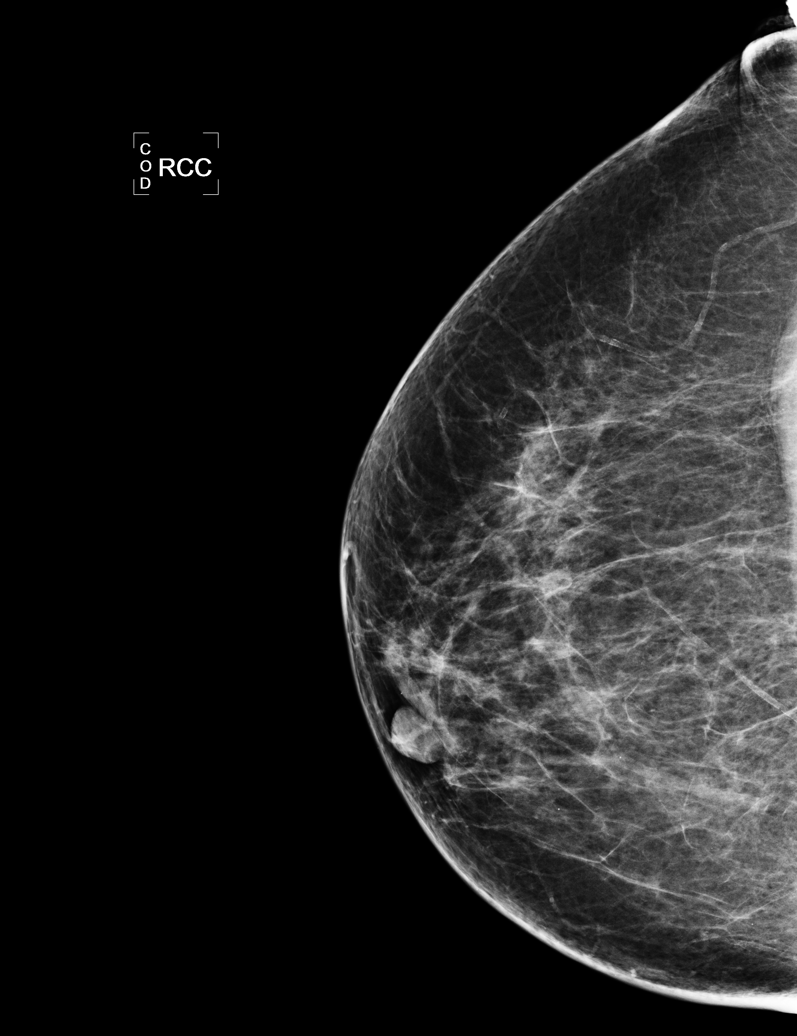

[5 of 5 positions shown; findings below may reference images not displayed]

FINDINGS: There are scattered fibroglandular densities. No
suspicious masses, architectural distortion, or calcifications are
present.

Images were processed with CAD.
IMPRESSION: No specific mammographic evidence of malignancy.

A result letter of this screening mammogram will be mailed directly
to the patient.

RECOMMENDATION:
Screening mammogram in one year. (Code:[C2])

BI-RADS CATEGORY 1:  Negative

## 2013-04-17 ENCOUNTER — Other Ambulatory Visit: Payer: Self-pay

## 2013-04-17 DIAGNOSIS — Z1231 Encounter for screening mammogram for malignant neoplasm of breast: Secondary | ICD-10-CM

## 2013-05-14 ENCOUNTER — Ambulatory Visit
Admission: RE | Admit: 2013-05-14 | Discharge: 2013-05-14 | Disposition: A | Discharge status: Home / Self Care | Payer: Medicare Other | Source: Ambulatory Visit

## 2013-05-14 DIAGNOSIS — Z1231 Encounter for screening mammogram for malignant neoplasm of breast: Secondary | ICD-10-CM

## 2014-04-22 ENCOUNTER — Other Ambulatory Visit: Payer: Self-pay

## 2014-04-22 DIAGNOSIS — Z1231 Encounter for screening mammogram for malignant neoplasm of breast: Secondary | ICD-10-CM

## 2014-05-17 ENCOUNTER — Encounter (INDEPENDENT_AMBULATORY_CARE_PROVIDER_SITE_OTHER): Payer: Self-pay

## 2014-05-17 ENCOUNTER — Ambulatory Visit
Admission: RE | Admit: 2014-05-17 | Discharge: 2014-05-17 | Disposition: A | Discharge status: Home / Self Care | Payer: Medicare Other | Source: Ambulatory Visit

## 2014-05-17 DIAGNOSIS — Z1231 Encounter for screening mammogram for malignant neoplasm of breast: Secondary | ICD-10-CM

## 2015-05-03 ENCOUNTER — Other Ambulatory Visit: Payer: Self-pay

## 2015-05-03 DIAGNOSIS — Z1231 Encounter for screening mammogram for malignant neoplasm of breast: Secondary | ICD-10-CM

## 2015-05-19 ENCOUNTER — Ambulatory Visit
Admission: RE | Admit: 2015-05-19 | Discharge: 2015-05-19 | Disposition: A | Discharge status: Home / Self Care | Payer: Medicaid Other | Source: Ambulatory Visit

## 2015-05-19 DIAGNOSIS — Z1231 Encounter for screening mammogram for malignant neoplasm of breast: Secondary | ICD-10-CM

## 2015-07-06 ENCOUNTER — Ambulatory Visit (INDEPENDENT_AMBULATORY_CARE_PROVIDER_SITE_OTHER): Payer: Medicare Other | Admitting: Podiatry

## 2015-07-06 ENCOUNTER — Encounter: Payer: Self-pay | Admitting: Podiatry

## 2015-07-06 VITALS — BP 142/77 | HR 85 | Temp 97.6°F | Resp 12 | Ht 64.0 in | Wt 156.0 lb

## 2015-07-06 DIAGNOSIS — M2041 Other hammer toe(s) (acquired), right foot: Secondary | ICD-10-CM

## 2015-07-06 NOTE — Progress Notes (Signed)
   Subjective:    Patient ID: Nicole [name], female    DOB: [birth year], 81 y.o.   MRN: [id number]  HPI This patient presents today with approximately three-month history on the posterior lateral hallux nail area. The symptoms are gradually worsening over time and are aggravated from the adjacent toe pressing in the area. Patient applies Epsom salts and soaks and Neosporin without relief of symptoms. She denies any obvious bacterial infection or professional treatment   Review of Systems  Constitutional: Positive for fever, chills, activity change and appetite change.  HENT: Positive for hearing loss.        Objective:   Physical Exam   Orientated 3  Vascular: No peripheral edema bilaterally DP pulses 2/4 bilaterally PT pulses 1/4 bilaterally Capillary reflex immediate bilaterally  Neurological: Sensation to 10 g monofilament wire intact 5/5 bilaterally Vibratory sensation reactive bilaterally Ankle reflex equal and reactive bilaterally  Dermatological: This a low-grade edema and callused nail groove on the posterior lateral nail groove of the right hallux. The second right toe is medially deviated and hammered and presses on that area. There are no obvious palpable lesions within the edematous area in the posterior lateral right nail fold area  Musculoskeletal: Stable gait Hammertoe second bilaterally There is no restriction ankle, subtalar midtarsal joints bilaterally       Assessment & Plan:   Assessment: Diminished posterior tibial pulses bilaterally Hammertoe second bilaterally Edematous posterior lateral nail fold area associated with friction rub from the second right toe  Plan: I reviewed the results of examination with patient today. At this time I am recommending that she wears a a gel toe separator between the right hallux and second right toe on a daily basis for 8-12 weeks. I advised her that if the area does not improve after wearing the separator on a  regular basis to return for further evaluation

## 2015-07-06 NOTE — Patient Instructions (Signed)
The second right toe is rubbing against the right great toenail area causing a local swelling and callus in the area. Wear the gel toe separator daily between the great toe and second toe 8-12 weeks. If the swelling does not reduce after wearing toe separator for 8-12 weeks return for further evaluation

## 2015-12-12 DIAGNOSIS — R590 Localized enlarged lymph nodes: Secondary | ICD-10-CM | POA: Diagnosis not present

## 2015-12-12 DIAGNOSIS — R7302 Impaired glucose tolerance (oral): Secondary | ICD-10-CM | POA: Diagnosis not present

## 2015-12-12 DIAGNOSIS — I10 Essential (primary) hypertension: Secondary | ICD-10-CM | POA: Diagnosis not present

## 2015-12-12 DIAGNOSIS — Z6828 Body mass index (BMI) 28.0-28.9, adult: Secondary | ICD-10-CM | POA: Diagnosis not present

## 2015-12-12 DIAGNOSIS — E784 Other hyperlipidemia: Secondary | ICD-10-CM | POA: Diagnosis not present

## 2015-12-12 DIAGNOSIS — E038 Other specified hypothyroidism: Secondary | ICD-10-CM | POA: Diagnosis not present

## 2015-12-12 DIAGNOSIS — Z1389 Encounter for screening for other disorder: Secondary | ICD-10-CM | POA: Diagnosis not present

## 2015-12-12 DIAGNOSIS — M1990 Unspecified osteoarthritis, unspecified site: Secondary | ICD-10-CM | POA: Diagnosis not present

## 2016-05-09 ENCOUNTER — Other Ambulatory Visit: Payer: Self-pay | Admitting: Internal Medicine

## 2016-05-09 DIAGNOSIS — Z139 Encounter for screening, unspecified: Secondary | ICD-10-CM

## 2016-05-22 ENCOUNTER — Ambulatory Visit
Admission: RE | Admit: 2016-05-22 | Discharge: 2016-05-22 | Disposition: A | Discharge status: Home / Self Care | Payer: Medicare Other | Source: Ambulatory Visit | Attending: Internal Medicine | Admitting: Internal Medicine

## 2016-05-22 DIAGNOSIS — Z1231 Encounter for screening mammogram for malignant neoplasm of breast: Secondary | ICD-10-CM | POA: Diagnosis not present

## 2016-05-22 DIAGNOSIS — Z139 Encounter for screening, unspecified: Secondary | ICD-10-CM

## 2016-06-21 DIAGNOSIS — E784 Other hyperlipidemia: Secondary | ICD-10-CM | POA: Diagnosis not present

## 2016-06-21 DIAGNOSIS — E038 Other specified hypothyroidism: Secondary | ICD-10-CM | POA: Diagnosis not present

## 2016-06-21 DIAGNOSIS — R7302 Impaired glucose tolerance (oral): Secondary | ICD-10-CM | POA: Diagnosis not present

## 2016-06-21 DIAGNOSIS — I10 Essential (primary) hypertension: Secondary | ICD-10-CM | POA: Diagnosis not present

## 2016-06-28 DIAGNOSIS — R590 Localized enlarged lymph nodes: Secondary | ICD-10-CM | POA: Diagnosis not present

## 2016-06-28 DIAGNOSIS — Z0000 Encounter for general adult medical examination without abnormal findings: Secondary | ICD-10-CM | POA: Diagnosis not present

## 2016-06-28 DIAGNOSIS — J309 Allergic rhinitis, unspecified: Secondary | ICD-10-CM | POA: Diagnosis not present

## 2016-06-28 DIAGNOSIS — N390 Urinary tract infection, site not specified: Secondary | ICD-10-CM | POA: Diagnosis not present

## 2016-06-28 DIAGNOSIS — E784 Other hyperlipidemia: Secondary | ICD-10-CM | POA: Diagnosis not present

## 2016-06-28 DIAGNOSIS — M1990 Unspecified osteoarthritis, unspecified site: Secondary | ICD-10-CM | POA: Diagnosis not present

## 2016-06-28 DIAGNOSIS — R7302 Impaired glucose tolerance (oral): Secondary | ICD-10-CM | POA: Diagnosis not present

## 2016-06-28 DIAGNOSIS — Z6827 Body mass index (BMI) 27.0-27.9, adult: Secondary | ICD-10-CM | POA: Diagnosis not present

## 2016-06-28 DIAGNOSIS — E038 Other specified hypothyroidism: Secondary | ICD-10-CM | POA: Diagnosis not present

## 2016-06-28 DIAGNOSIS — I10 Essential (primary) hypertension: Secondary | ICD-10-CM | POA: Diagnosis not present

## 2016-06-28 DIAGNOSIS — R8299 Other abnormal findings in urine: Secondary | ICD-10-CM | POA: Diagnosis not present

## 2016-07-21 DIAGNOSIS — Z23 Encounter for immunization: Secondary | ICD-10-CM | POA: Diagnosis not present

## 2016-12-12 DIAGNOSIS — R590 Localized enlarged lymph nodes: Secondary | ICD-10-CM | POA: Diagnosis not present

## 2016-12-12 DIAGNOSIS — R7302 Impaired glucose tolerance (oral): Secondary | ICD-10-CM | POA: Diagnosis not present

## 2016-12-12 DIAGNOSIS — E784 Other hyperlipidemia: Secondary | ICD-10-CM | POA: Diagnosis not present

## 2016-12-12 DIAGNOSIS — I10 Essential (primary) hypertension: Secondary | ICD-10-CM | POA: Diagnosis not present

## 2017-05-03 ENCOUNTER — Other Ambulatory Visit: Payer: Self-pay | Admitting: Internal Medicine

## 2017-05-03 DIAGNOSIS — Z1231 Encounter for screening mammogram for malignant neoplasm of breast: Secondary | ICD-10-CM

## 2017-05-24 ENCOUNTER — Ambulatory Visit
Admission: RE | Admit: 2017-05-24 | Discharge: 2017-05-24 | Disposition: A | Discharge status: Home / Self Care | Payer: Medicare Other | Source: Ambulatory Visit | Attending: Internal Medicine | Admitting: Internal Medicine

## 2017-05-24 DIAGNOSIS — Z1231 Encounter for screening mammogram for malignant neoplasm of breast: Secondary | ICD-10-CM | POA: Diagnosis not present

## 2017-06-12 DIAGNOSIS — H903 Sensorineural hearing loss, bilateral: Secondary | ICD-10-CM | POA: Diagnosis not present

## 2017-06-12 DIAGNOSIS — H9311 Tinnitus, right ear: Secondary | ICD-10-CM | POA: Diagnosis not present

## 2017-07-03 DIAGNOSIS — I10 Essential (primary) hypertension: Secondary | ICD-10-CM | POA: Diagnosis not present

## 2017-07-03 DIAGNOSIS — R7302 Impaired glucose tolerance (oral): Secondary | ICD-10-CM | POA: Diagnosis not present

## 2017-07-03 DIAGNOSIS — E784 Other hyperlipidemia: Secondary | ICD-10-CM | POA: Diagnosis not present

## 2017-07-03 DIAGNOSIS — E038 Other specified hypothyroidism: Secondary | ICD-10-CM | POA: Diagnosis not present

## 2017-07-10 DIAGNOSIS — Z0000 Encounter for general adult medical examination without abnormal findings: Secondary | ICD-10-CM | POA: Diagnosis not present

## 2017-07-10 DIAGNOSIS — R7302 Impaired glucose tolerance (oral): Secondary | ICD-10-CM | POA: Diagnosis not present

## 2017-07-10 DIAGNOSIS — E784 Other hyperlipidemia: Secondary | ICD-10-CM | POA: Diagnosis not present

## 2017-07-10 DIAGNOSIS — I10 Essential (primary) hypertension: Secondary | ICD-10-CM | POA: Diagnosis not present

## 2017-09-18 DIAGNOSIS — M1712 Unilateral primary osteoarthritis, left knee: Secondary | ICD-10-CM | POA: Diagnosis not present

## 2018-01-08 DIAGNOSIS — R7302 Impaired glucose tolerance (oral): Secondary | ICD-10-CM | POA: Diagnosis not present

## 2018-01-08 DIAGNOSIS — E663 Overweight: Secondary | ICD-10-CM | POA: Diagnosis not present

## 2018-01-08 DIAGNOSIS — M5416 Radiculopathy, lumbar region: Secondary | ICD-10-CM | POA: Diagnosis not present

## 2018-01-08 DIAGNOSIS — R590 Localized enlarged lymph nodes: Secondary | ICD-10-CM | POA: Diagnosis not present

## 2018-04-23 ENCOUNTER — Other Ambulatory Visit: Payer: Self-pay | Admitting: Internal Medicine

## 2018-04-23 DIAGNOSIS — Z1231 Encounter for screening mammogram for malignant neoplasm of breast: Secondary | ICD-10-CM

## 2018-05-30 ENCOUNTER — Ambulatory Visit
Admission: RE | Admit: 2018-05-30 | Discharge: 2018-05-30 | Disposition: A | Discharge status: Home / Self Care | Payer: Medicare Other | Source: Ambulatory Visit | Attending: Internal Medicine | Admitting: Internal Medicine

## 2018-05-30 DIAGNOSIS — Z1231 Encounter for screening mammogram for malignant neoplasm of breast: Secondary | ICD-10-CM | POA: Diagnosis not present

## 2018-06-18 DIAGNOSIS — H903 Sensorineural hearing loss, bilateral: Secondary | ICD-10-CM | POA: Diagnosis not present

## 2018-06-18 DIAGNOSIS — H9311 Tinnitus, right ear: Secondary | ICD-10-CM | POA: Diagnosis not present

## 2018-06-18 DIAGNOSIS — H61303 Acquired stenosis of external ear canal, unspecified, bilateral: Secondary | ICD-10-CM | POA: Diagnosis not present

## 2018-07-10 DIAGNOSIS — I10 Essential (primary) hypertension: Secondary | ICD-10-CM | POA: Diagnosis not present

## 2018-07-10 DIAGNOSIS — R82998 Other abnormal findings in urine: Secondary | ICD-10-CM | POA: Diagnosis not present

## 2018-07-10 DIAGNOSIS — E038 Other specified hypothyroidism: Secondary | ICD-10-CM | POA: Diagnosis not present

## 2018-07-10 DIAGNOSIS — E7849 Other hyperlipidemia: Secondary | ICD-10-CM | POA: Diagnosis not present

## 2018-07-10 DIAGNOSIS — R7302 Impaired glucose tolerance (oral): Secondary | ICD-10-CM | POA: Diagnosis not present

## 2018-07-16 DIAGNOSIS — R7302 Impaired glucose tolerance (oral): Secondary | ICD-10-CM | POA: Diagnosis not present

## 2018-07-16 DIAGNOSIS — I10 Essential (primary) hypertension: Secondary | ICD-10-CM | POA: Diagnosis not present

## 2018-07-16 DIAGNOSIS — Z0000 Encounter for general adult medical examination without abnormal findings: Secondary | ICD-10-CM | POA: Diagnosis not present

## 2018-07-16 DIAGNOSIS — E7849 Other hyperlipidemia: Secondary | ICD-10-CM | POA: Diagnosis not present

## 2019-01-14 DIAGNOSIS — E7849 Other hyperlipidemia: Secondary | ICD-10-CM | POA: Diagnosis not present

## 2019-01-14 DIAGNOSIS — R7301 Impaired fasting glucose: Secondary | ICD-10-CM | POA: Diagnosis not present

## 2019-01-14 DIAGNOSIS — E038 Other specified hypothyroidism: Secondary | ICD-10-CM | POA: Diagnosis not present

## 2019-01-14 DIAGNOSIS — I10 Essential (primary) hypertension: Secondary | ICD-10-CM | POA: Diagnosis not present

## 2019-05-04 ENCOUNTER — Other Ambulatory Visit: Payer: Self-pay | Admitting: Internal Medicine

## 2019-05-04 DIAGNOSIS — Z1231 Encounter for screening mammogram for malignant neoplasm of breast: Secondary | ICD-10-CM

## 2019-06-19 ENCOUNTER — Other Ambulatory Visit: Payer: Self-pay

## 2019-06-19 ENCOUNTER — Ambulatory Visit
Admission: RE | Admit: 2019-06-19 | Discharge: 2019-06-19 | Disposition: A | Discharge status: Home / Self Care | Payer: Medicare Other | Source: Ambulatory Visit | Attending: Internal Medicine | Admitting: Internal Medicine

## 2019-06-19 DIAGNOSIS — Z1231 Encounter for screening mammogram for malignant neoplasm of breast: Secondary | ICD-10-CM | POA: Diagnosis not present

## 2019-06-19 IMAGING — MG DIGITAL SCREENING BILATERAL MAMMOGRAM WITH TOMO AND CAD
6 of 12 series · 6 of 36 positions shown · non-contrast
Comparison: Previous exam(s).

CLINICAL DATA: Screening.

EXAM:
DIGITAL SCREENING BILATERAL MAMMOGRAM WITH TOMO AND CAD

[L MLO synth-2D (1 of 2)]
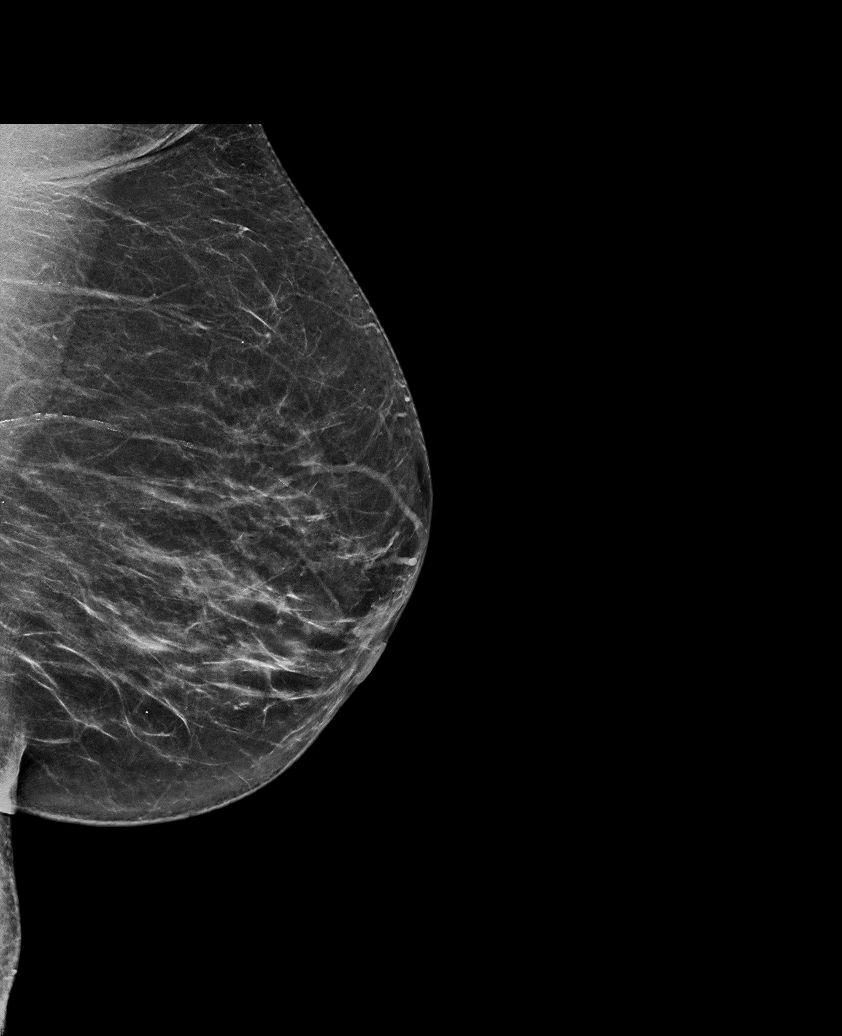

[L CC synth-2D]
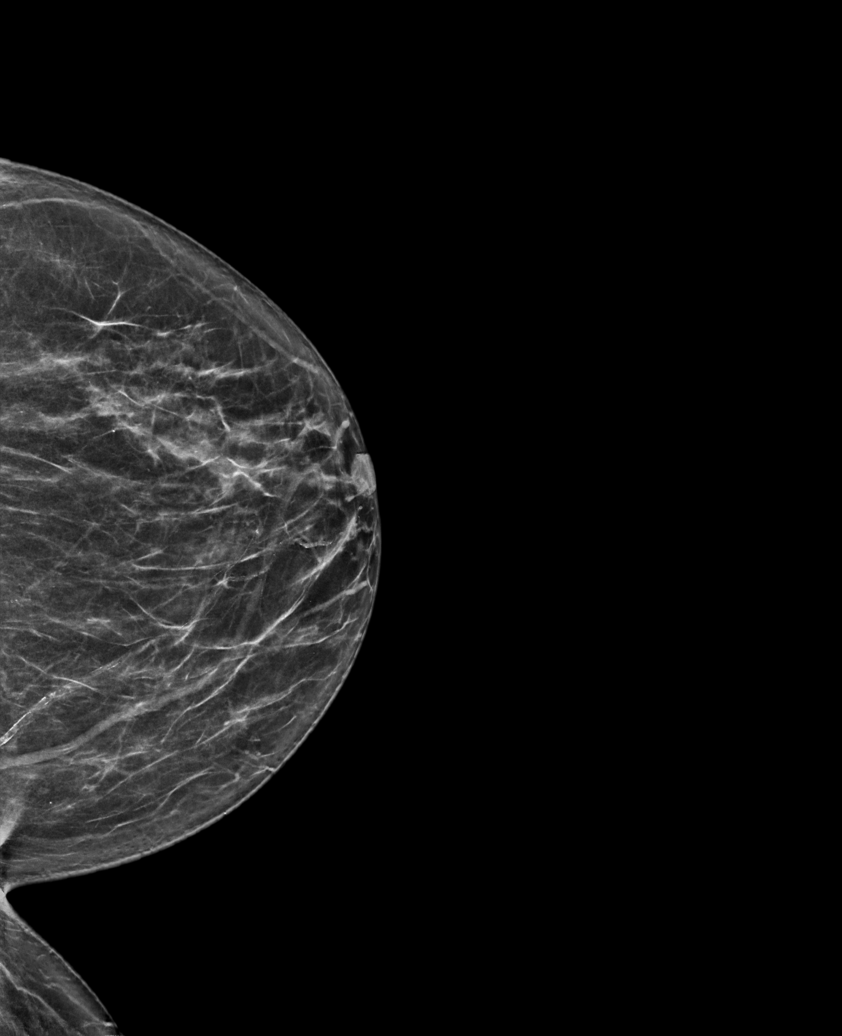

[R MLO synth-2D (1 of 2)]
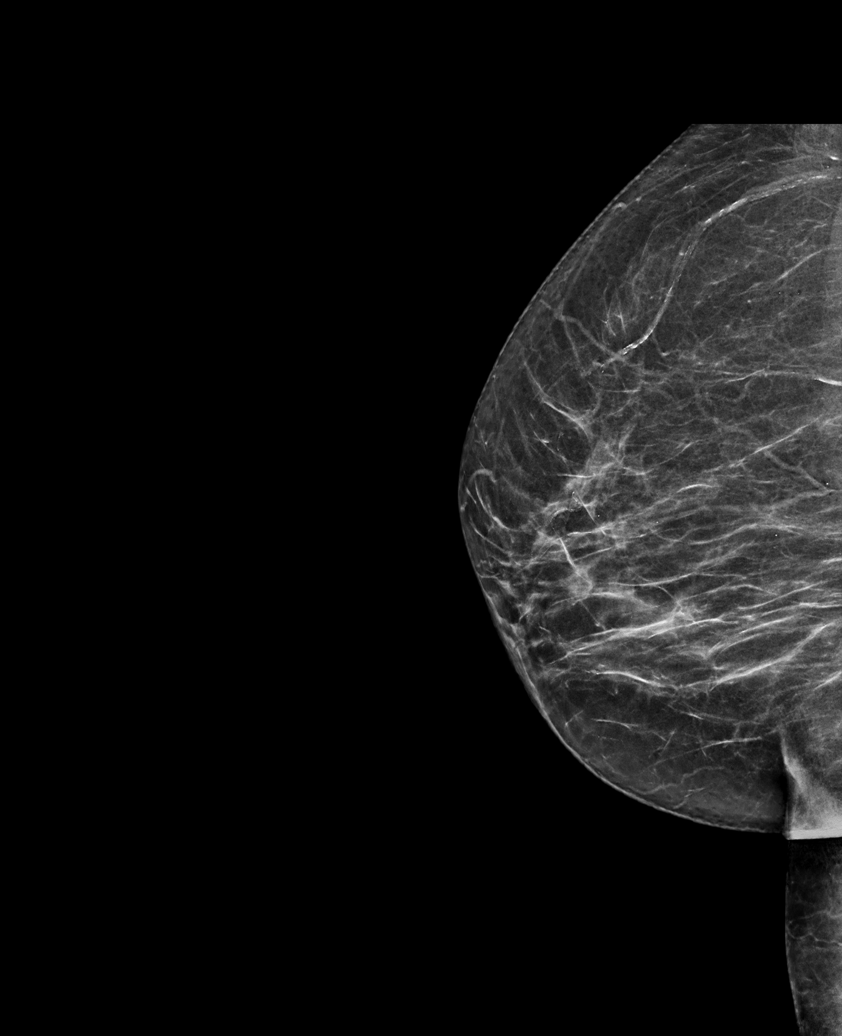

[R MLO synth-2D (2 of 2)]
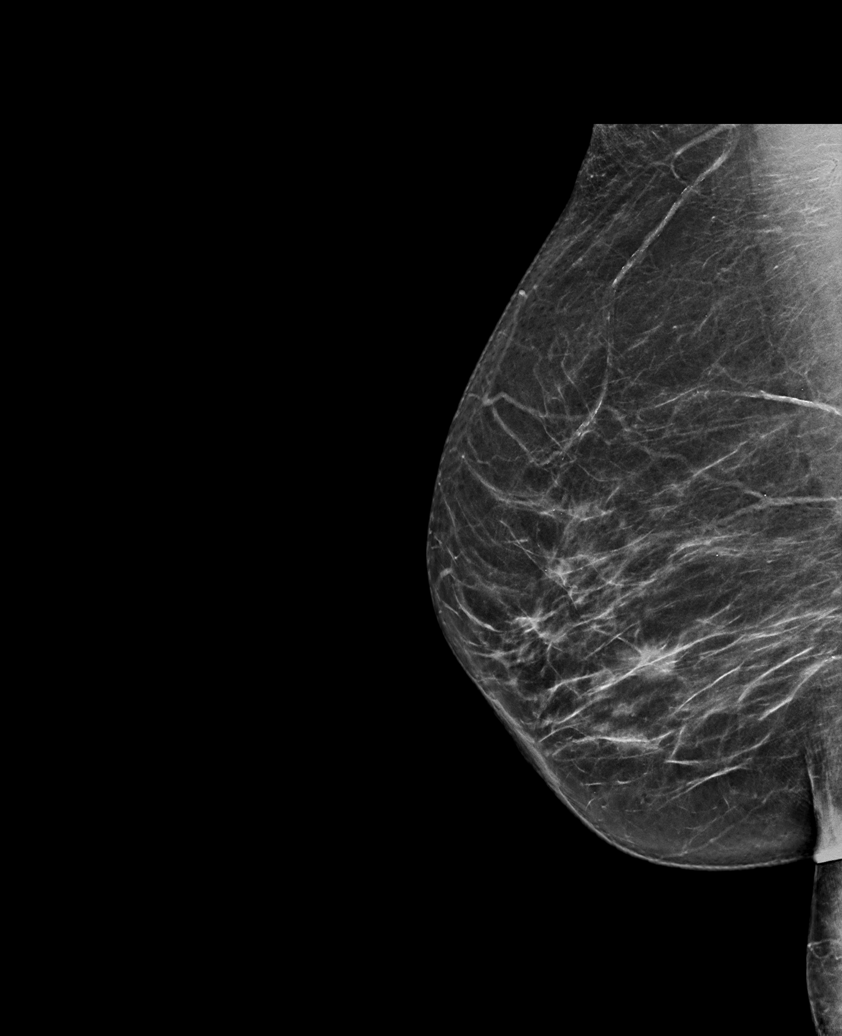

[R CC synth-2D]
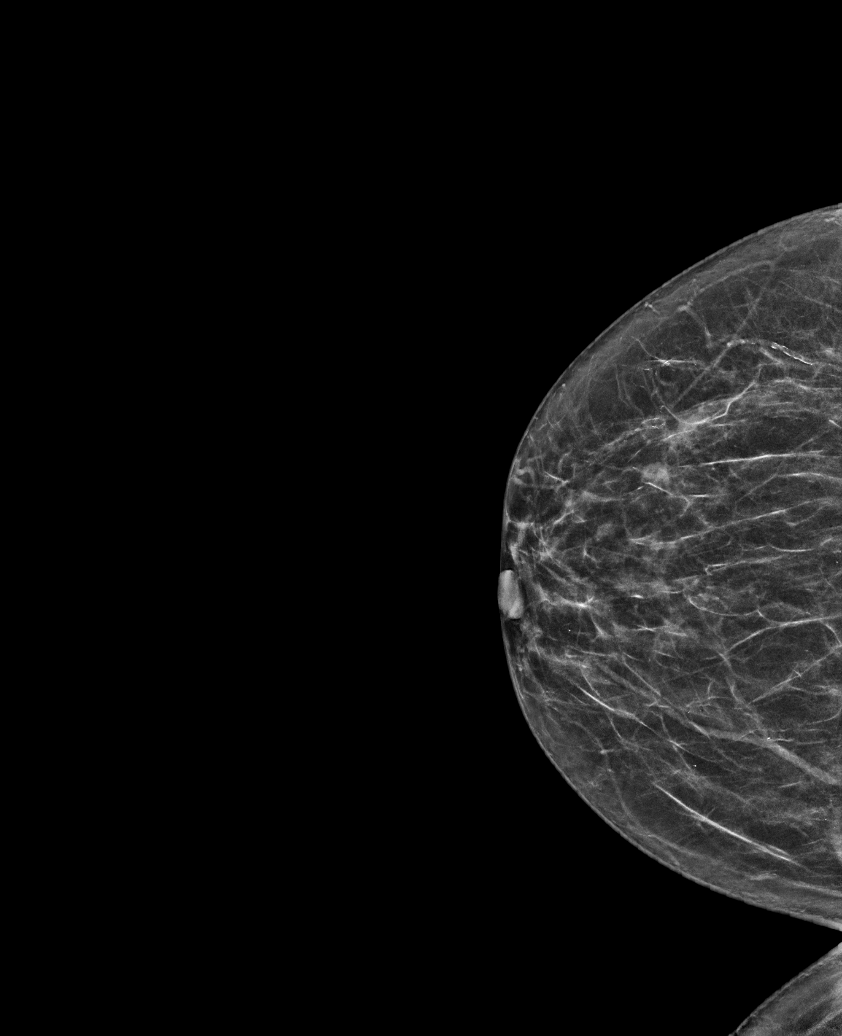

[L MLO synth-2D (2 of 2)]
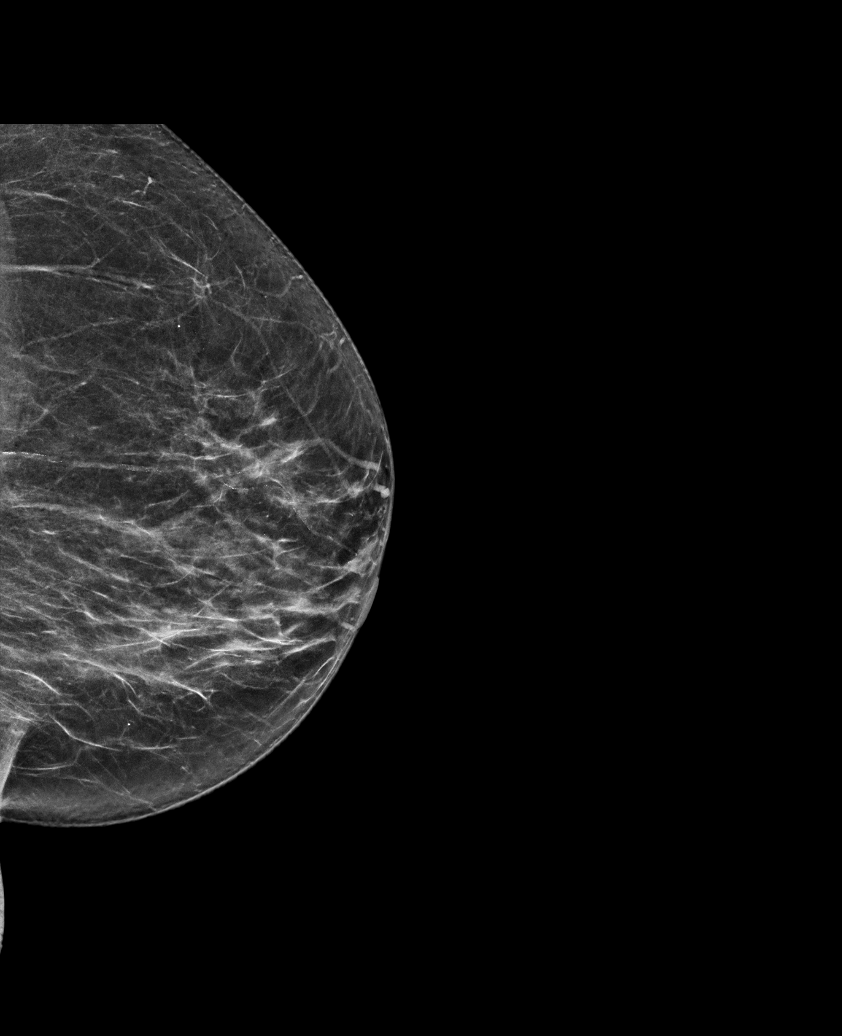

[6 of 36 positions shown; findings below may reference images not displayed]

ACR Breast Density Category b: There are scattered areas of
fibroglandular density.
FINDINGS: There are no findings suspicious for malignancy. Images were
processed with CAD.
IMPRESSION: No mammographic evidence of malignancy. A result letter of this
screening mammogram will be mailed directly to the patient.

RECOMMENDATION:
Screening mammogram in one year. (Code:[TQ])

BI-RADS CATEGORY  1: Negative.

## 2019-06-26 DIAGNOSIS — Z23 Encounter for immunization: Secondary | ICD-10-CM | POA: Diagnosis not present

## 2019-07-20 DIAGNOSIS — E7849 Other hyperlipidemia: Secondary | ICD-10-CM | POA: Diagnosis not present

## 2019-07-20 DIAGNOSIS — R7301 Impaired fasting glucose: Secondary | ICD-10-CM | POA: Diagnosis not present

## 2019-07-20 DIAGNOSIS — E038 Other specified hypothyroidism: Secondary | ICD-10-CM | POA: Diagnosis not present

## 2019-07-21 DIAGNOSIS — R82998 Other abnormal findings in urine: Secondary | ICD-10-CM | POA: Diagnosis not present

## 2019-07-21 DIAGNOSIS — I10 Essential (primary) hypertension: Secondary | ICD-10-CM | POA: Diagnosis not present

## 2019-07-22 DIAGNOSIS — E785 Hyperlipidemia, unspecified: Secondary | ICD-10-CM | POA: Diagnosis not present

## 2019-07-22 DIAGNOSIS — I10 Essential (primary) hypertension: Secondary | ICD-10-CM | POA: Diagnosis not present

## 2019-07-22 DIAGNOSIS — Z0000 Encounter for general adult medical examination without abnormal findings: Secondary | ICD-10-CM | POA: Diagnosis not present

## 2019-07-22 DIAGNOSIS — R7301 Impaired fasting glucose: Secondary | ICD-10-CM | POA: Diagnosis not present

## 2019-07-23 DIAGNOSIS — Z1212 Encounter for screening for malignant neoplasm of rectum: Secondary | ICD-10-CM | POA: Diagnosis not present

## 2019-11-01 DIAGNOSIS — M25561 Pain in right knee: Secondary | ICD-10-CM | POA: Diagnosis not present

## 2020-01-27 DIAGNOSIS — I10 Essential (primary) hypertension: Secondary | ICD-10-CM | POA: Diagnosis not present

## 2020-01-27 DIAGNOSIS — R7301 Impaired fasting glucose: Secondary | ICD-10-CM | POA: Diagnosis not present

## 2020-01-27 DIAGNOSIS — M5416 Radiculopathy, lumbar region: Secondary | ICD-10-CM | POA: Diagnosis not present

## 2020-01-27 DIAGNOSIS — M1990 Unspecified osteoarthritis, unspecified site: Secondary | ICD-10-CM | POA: Diagnosis not present

## 2020-05-11 ENCOUNTER — Other Ambulatory Visit: Payer: Self-pay | Admitting: Internal Medicine

## 2020-05-11 DIAGNOSIS — Z1231 Encounter for screening mammogram for malignant neoplasm of breast: Secondary | ICD-10-CM

## 2020-06-21 ENCOUNTER — Other Ambulatory Visit: Payer: Self-pay

## 2020-06-21 ENCOUNTER — Ambulatory Visit
Admission: RE | Admit: 2020-06-21 | Discharge: 2020-06-21 | Disposition: A | Discharge status: Home / Self Care | Payer: Medicare Other | Source: Ambulatory Visit | Attending: Internal Medicine | Admitting: Internal Medicine

## 2020-06-21 DIAGNOSIS — Z1231 Encounter for screening mammogram for malignant neoplasm of breast: Secondary | ICD-10-CM

## 2020-06-21 IMAGING — MG DIGITAL SCREENING BILAT W/ TOMO W/ CAD
6 of 10 series · 6 of 30 positions shown · non-contrast
Comparison: Previous exam(s).

CLINICAL DATA: Screening.

EXAM:
DIGITAL SCREENING BILATERAL MAMMOGRAM WITH TOMO AND CAD

[R CC synth-2D]
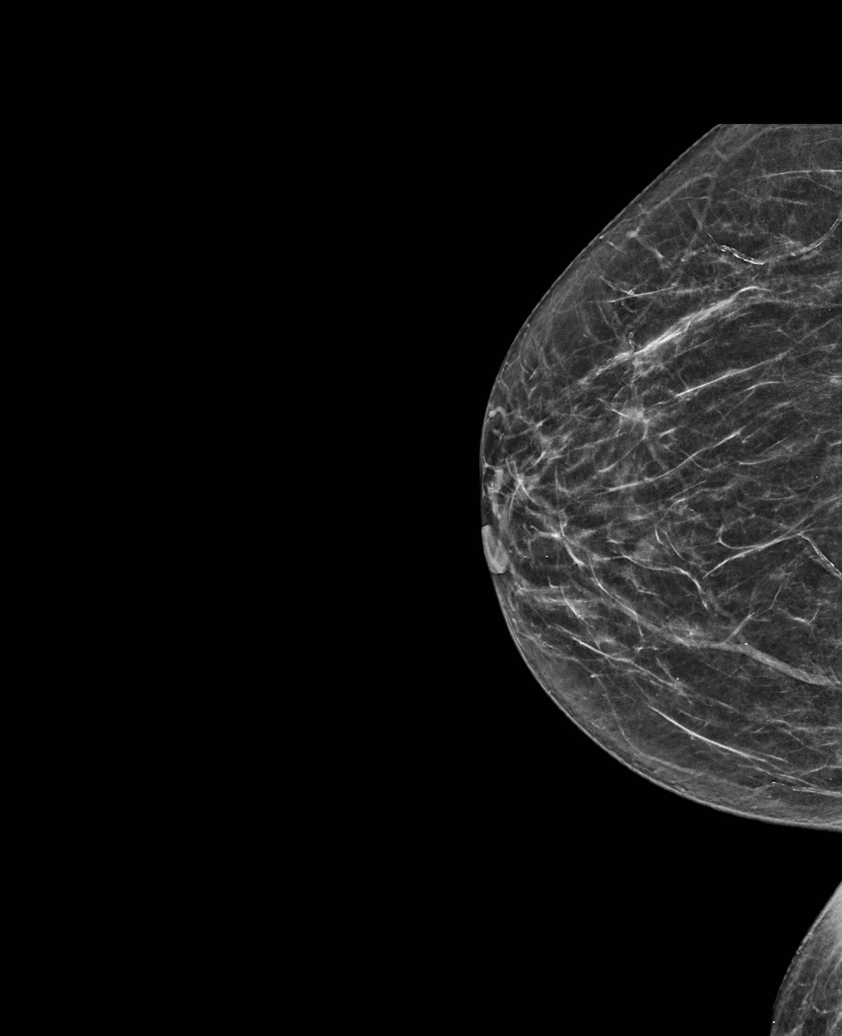

[R MLO synth-2D (1 of 2)]
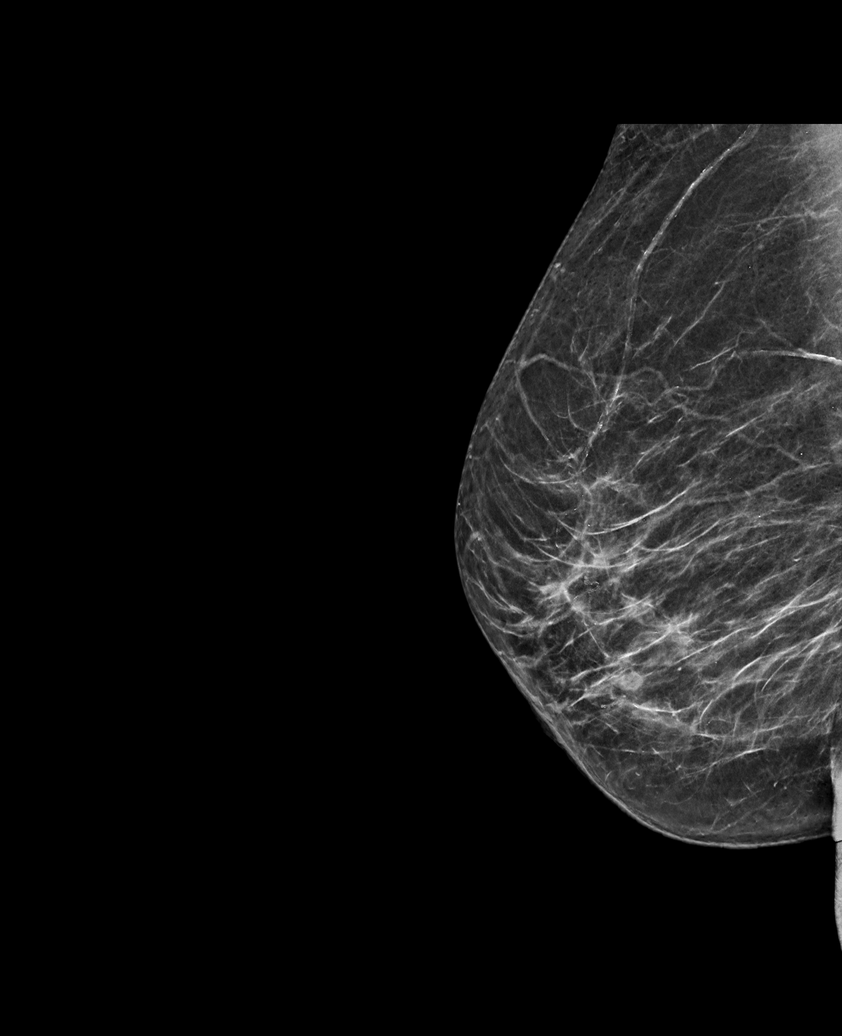

[L MLO synth-2D]
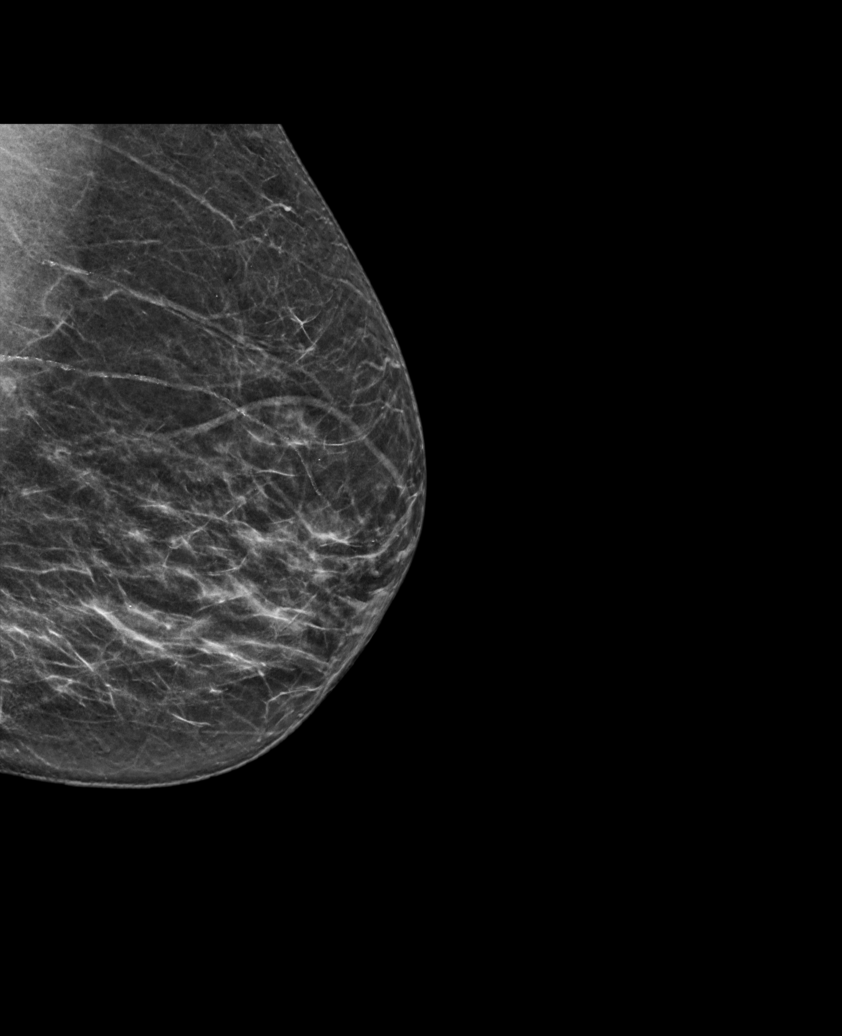

[R MLO synth-2D (2 of 2)]
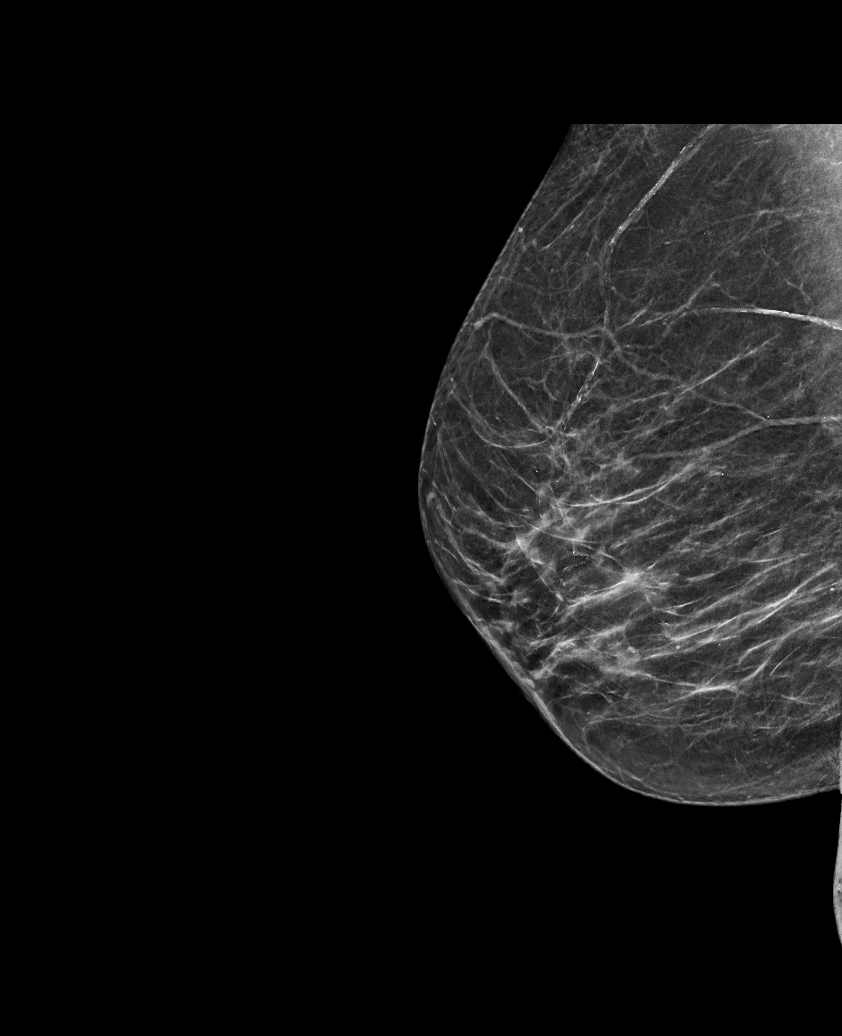

[L CC synth-2D]
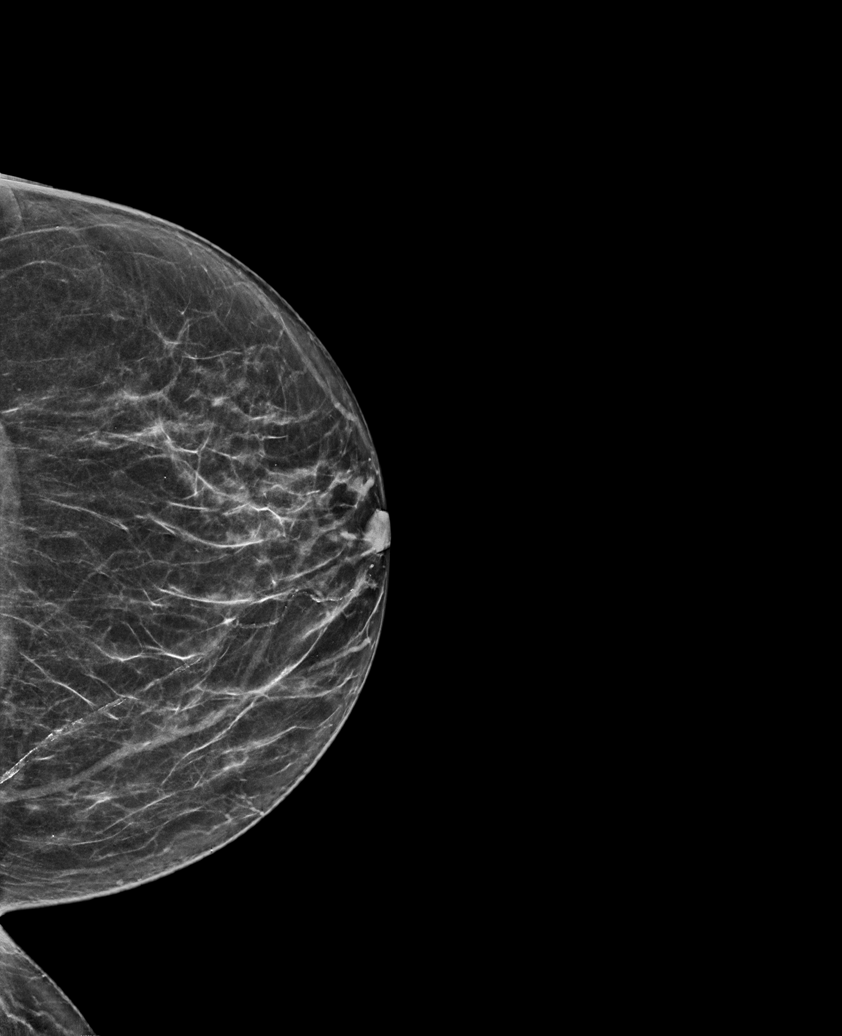

[L CC tomo · tomo slice 33/64.0]
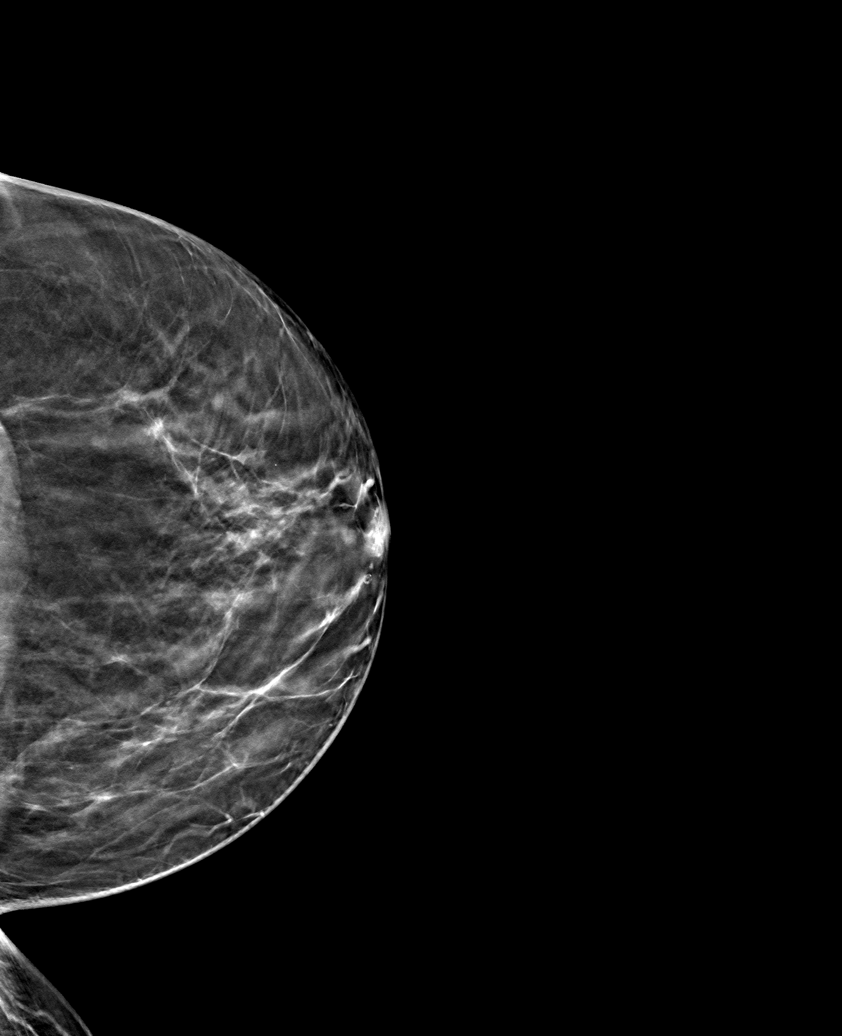

[6 of 30 positions shown; findings below may reference images not displayed]

ACR Breast Density Category b: There are scattered areas of
fibroglandular density.
FINDINGS: There are no findings suspicious for malignancy. Images were
processed with CAD.
IMPRESSION: No mammographic evidence of malignancy. A result letter of this
screening mammogram will be mailed directly to the patient.

RECOMMENDATION:
Screening mammogram in one year. (Code:[TQ])

BI-RADS CATEGORY  1: Negative.

## 2020-07-04 DIAGNOSIS — M25562 Pain in left knee: Secondary | ICD-10-CM | POA: Diagnosis not present

## 2020-07-14 ENCOUNTER — Other Ambulatory Visit: Payer: Self-pay

## 2020-07-14 ENCOUNTER — Ambulatory Visit: Payer: Medicare Other | Admitting: Sports Medicine

## 2020-07-14 ENCOUNTER — Encounter: Payer: Self-pay | Admitting: Sports Medicine

## 2020-07-14 DIAGNOSIS — M2142 Flat foot [pes planus] (acquired), left foot: Secondary | ICD-10-CM | POA: Diagnosis not present

## 2020-07-14 DIAGNOSIS — L608 Other nail disorders: Secondary | ICD-10-CM | POA: Diagnosis not present

## 2020-07-14 DIAGNOSIS — M2141 Flat foot [pes planus] (acquired), right foot: Secondary | ICD-10-CM | POA: Diagnosis not present

## 2020-07-14 DIAGNOSIS — L603 Nail dystrophy: Secondary | ICD-10-CM

## 2020-07-14 DIAGNOSIS — M79675 Pain in left toe(s): Secondary | ICD-10-CM | POA: Diagnosis not present

## 2020-07-14 DIAGNOSIS — M79674 Pain in right toe(s): Secondary | ICD-10-CM

## 2020-07-14 NOTE — Progress Notes (Signed)
  Subjective: Nicole [name] is a 86 y.o. female patient seen today in office with complaint of mildly painful thickened and discolored nails. Patient is desiring treatment for nail changes; has tried OTC topicals/Medication in the past with no improvement. Reports that she also wants inserts. Patient has no other pedal complaints at this time.   There are no problems to display for this patient.   Current Outpatient Medications on File Prior to Visit  Medication Sig Dispense Refill  . aspirin 81 MG EC tablet Take by mouth.    . celecoxib (CELEBREX) 200 MG capsule     . amLODipine (NORVASC) 10 MG tablet Take 10 mg by mouth daily.  11  . cefdinir (OMNICEF) 300 MG capsule Take 300 mg by mouth 2 (two) times daily.  0  . celecoxib (CELEBREX) 200 MG capsule Take 200 mg by mouth daily.    . Diclofenac Sodium CR 100 MG 24 hr tablet   1  . hydrochlorothiazide (HYDRODIURIL) 25 MG tablet Take 25 mg by mouth daily.  2  . ibandronate (BONIVA) 150 MG tablet Take by mouth.    . levothyroxine (SYNTHROID) 50 MCG tablet Take 50 mcg by mouth daily.    Marland Kitchen levothyroxine (SYNTHROID, LEVOTHROID) 25 MCG tablet Take 25 mcg by mouth daily.  3  . oxybutynin (DITROPAN-XL) 5 MG 24 hr tablet Take 5 mg by mouth daily.    . pravastatin (PRAVACHOL) 40 MG tablet   2  . PROAIR HFA 108 (90 BASE) MCG/ACT inhaler INHALE 1 PUFF BY MOUTH EVERY 4 HOURS AS NEEDED  0   No current facility-administered medications on file prior to visit.    Not on File  Objective: Physical Exam  General: Well developed, nourished, no acute distress, awake, alert and oriented x 3 HOH assisted by daughter   Vascular: Dorsalis pedis artery 1/4 bilateral, Posterior tibial artery 1/4 bilateral, skin temperature warm to warm proximal to distal bilateral lower extremities, no varicosities, pedal hair present bilateral.  Neurological: Gross sensation present via light touch bilateral.   Dermatological: Skin is warm, dry, and supple bilateral,  Bilateral hallux nails are tender, short thick, and discolored with mild subungal debris and dry blood at left1st toenail bed proximal aspect that appears stable, no webspace macerations present bilateral, no open lesions present bilateral, no callus/corns/hyperkeratotic tissue present bilateral. No signs of infection bilateral.  Musculoskeletal: Bunion and pes planus boney deformities noted bilateral. Muscular strength within normal limits without painon range of motion. No pain with calf compression bilateral.  Assessment and Plan:  Problem List Items Addressed This Visit    None    Visit Diagnoses    Onychodystrophy    -  Primary   Relevant Orders   Culture, fungus without smear   Toe pain, bilateral       Pes planus of both feet          -Examined patient -Discussed treatment options for painful dystrophic nails  -Fungal culture was obtained by removing a portion of the hard nail itself from each of the involved toenails using a sterile nail nipper and sent to [facility] lab. Patient tolerated the biopsy procedure well without discomfort or need for anesthesia.  -Recommend patient to try OTC insoles because her insurance will not cover custom due to diagnosis -Shoe list provided -Patient to return in 4 weeks for follow up evaluation and discussion of fungal culture results or sooner if symptoms worsen.  Asencion Islam, DPM

## 2020-07-14 NOTE — Patient Instructions (Signed)

## 2020-07-20 ENCOUNTER — Encounter (HOSPITAL_COMMUNITY): Payer: Self-pay | Admitting: Internal Medicine

## 2020-07-20 ENCOUNTER — Encounter (HOSPITAL_COMMUNITY): Payer: Self-pay

## 2020-07-20 ENCOUNTER — Emergency Department (HOSPITAL_COMMUNITY): Payer: Medicare Other

## 2020-07-20 ENCOUNTER — Inpatient Hospital Stay (HOSPITAL_COMMUNITY)
Admission: EM | Admit: 2020-07-20 | Discharge: 2020-07-26 | DRG: 065 | Disposition: A | Discharge status: Rehabilitation Facility | Payer: Medicare Other | Attending: Internal Medicine | Admitting: Internal Medicine

## 2020-07-20 ENCOUNTER — Other Ambulatory Visit: Payer: Self-pay

## 2020-07-20 ENCOUNTER — Ambulatory Visit (HOSPITAL_COMMUNITY)
Admission: EM | Admit: 2020-07-20 | Discharge: 2020-07-20 | Payer: Medicare Other | Attending: Family Medicine | Admitting: Family Medicine

## 2020-07-20 DIAGNOSIS — M25562 Pain in left knee: Secondary | ICD-10-CM | POA: Diagnosis present

## 2020-07-20 DIAGNOSIS — I63311 Cerebral infarction due to thrombosis of right middle cerebral artery: Secondary | ICD-10-CM | POA: Diagnosis not present

## 2020-07-20 DIAGNOSIS — I63511 Cerebral infarction due to unspecified occlusion or stenosis of right middle cerebral artery: Secondary | ICD-10-CM | POA: Diagnosis not present

## 2020-07-20 DIAGNOSIS — R29705 NIHSS score 5: Secondary | ICD-10-CM | POA: Diagnosis not present

## 2020-07-20 DIAGNOSIS — G319 Degenerative disease of nervous system, unspecified: Secondary | ICD-10-CM | POA: Diagnosis not present

## 2020-07-20 DIAGNOSIS — I6782 Cerebral ischemia: Secondary | ICD-10-CM | POA: Diagnosis not present

## 2020-07-20 DIAGNOSIS — R202 Paresthesia of skin: Secondary | ICD-10-CM | POA: Diagnosis not present

## 2020-07-20 DIAGNOSIS — K5900 Constipation, unspecified: Secondary | ICD-10-CM | POA: Diagnosis not present

## 2020-07-20 DIAGNOSIS — E785 Hyperlipidemia, unspecified: Secondary | ICD-10-CM | POA: Diagnosis not present

## 2020-07-20 DIAGNOSIS — Z79899 Other long term (current) drug therapy: Secondary | ICD-10-CM

## 2020-07-20 DIAGNOSIS — G8929 Other chronic pain: Secondary | ICD-10-CM | POA: Diagnosis not present

## 2020-07-20 DIAGNOSIS — D72829 Elevated white blood cell count, unspecified: Secondary | ICD-10-CM | POA: Diagnosis not present

## 2020-07-20 DIAGNOSIS — I10 Essential (primary) hypertension: Secondary | ICD-10-CM | POA: Diagnosis not present

## 2020-07-20 DIAGNOSIS — I340 Nonrheumatic mitral (valve) insufficiency: Secondary | ICD-10-CM | POA: Diagnosis not present

## 2020-07-20 DIAGNOSIS — R29810 Facial weakness: Secondary | ICD-10-CM | POA: Diagnosis present

## 2020-07-20 DIAGNOSIS — J45909 Unspecified asthma, uncomplicated: Secondary | ICD-10-CM | POA: Diagnosis not present

## 2020-07-20 DIAGNOSIS — Z79890 Hormone replacement therapy (postmenopausal): Secondary | ICD-10-CM

## 2020-07-20 DIAGNOSIS — Z20822 Contact with and (suspected) exposure to covid-19: Secondary | ICD-10-CM | POA: Diagnosis present

## 2020-07-20 DIAGNOSIS — R4781 Slurred speech: Secondary | ICD-10-CM | POA: Diagnosis not present

## 2020-07-20 DIAGNOSIS — H409 Unspecified glaucoma: Secondary | ICD-10-CM | POA: Diagnosis present

## 2020-07-20 DIAGNOSIS — E039 Hypothyroidism, unspecified: Secondary | ICD-10-CM | POA: Diagnosis not present

## 2020-07-20 DIAGNOSIS — Z23 Encounter for immunization: Secondary | ICD-10-CM | POA: Diagnosis not present

## 2020-07-20 DIAGNOSIS — R0902 Hypoxemia: Secondary | ICD-10-CM | POA: Diagnosis not present

## 2020-07-20 DIAGNOSIS — E876 Hypokalemia: Secondary | ICD-10-CM | POA: Diagnosis not present

## 2020-07-20 DIAGNOSIS — M21372 Foot drop, left foot: Secondary | ICD-10-CM | POA: Diagnosis not present

## 2020-07-20 DIAGNOSIS — Z7982 Long term (current) use of aspirin: Secondary | ICD-10-CM

## 2020-07-20 DIAGNOSIS — R29898 Other symptoms and signs involving the musculoskeletal system: Secondary | ICD-10-CM | POA: Diagnosis not present

## 2020-07-20 DIAGNOSIS — G8194 Hemiplegia, unspecified affecting left nondominant side: Secondary | ICD-10-CM

## 2020-07-20 DIAGNOSIS — I639 Cerebral infarction, unspecified: Secondary | ICD-10-CM | POA: Diagnosis present

## 2020-07-20 DIAGNOSIS — R4789 Other speech disturbances: Secondary | ICD-10-CM

## 2020-07-20 DIAGNOSIS — R531 Weakness: Secondary | ICD-10-CM | POA: Diagnosis not present

## 2020-07-20 DIAGNOSIS — I69392 Facial weakness following cerebral infarction: Secondary | ICD-10-CM | POA: Diagnosis not present

## 2020-07-20 DIAGNOSIS — R9082 White matter disease, unspecified: Secondary | ICD-10-CM | POA: Diagnosis not present

## 2020-07-20 DIAGNOSIS — N3281 Overactive bladder: Secondary | ICD-10-CM | POA: Diagnosis not present

## 2020-07-20 DIAGNOSIS — R29818 Other symptoms and signs involving the nervous system: Secondary | ICD-10-CM | POA: Diagnosis not present

## 2020-07-20 DIAGNOSIS — I6389 Other cerebral infarction: Secondary | ICD-10-CM | POA: Diagnosis not present

## 2020-07-20 DIAGNOSIS — I700 Atherosclerosis of aorta: Secondary | ICD-10-CM | POA: Diagnosis not present

## 2020-07-20 DIAGNOSIS — I6381 Other cerebral infarction due to occlusion or stenosis of small artery: Secondary | ICD-10-CM | POA: Diagnosis not present

## 2020-07-20 DIAGNOSIS — Z833 Family history of diabetes mellitus: Secondary | ICD-10-CM | POA: Diagnosis not present

## 2020-07-20 DIAGNOSIS — K5901 Slow transit constipation: Secondary | ICD-10-CM | POA: Diagnosis not present

## 2020-07-20 DIAGNOSIS — I69354 Hemiplegia and hemiparesis following cerebral infarction affecting left non-dominant side: Secondary | ICD-10-CM | POA: Diagnosis not present

## 2020-07-20 LAB — CBC
HCT: 42 % (ref 36.0–46.0)
Hemoglobin: 13.3 g/dL (ref 12.0–15.0)
MCH: 30.5 pg (ref 26.0–34.0)
MCHC: 31.7 g/dL (ref 30.0–36.0)
MCV: 96.3 fL (ref 80.0–100.0)
Platelets: 252 10*3/uL (ref 150–400)
RBC: 4.36 MIL/uL (ref 3.87–5.11)
RDW: 12.2 % (ref 11.5–15.5)
WBC: 10.8 10*3/uL — ABNORMAL HIGH (ref 4.0–10.5)
nRBC: 0 % (ref 0.0–0.2)

## 2020-07-20 LAB — COMPREHENSIVE METABOLIC PANEL
ALT: 23 U/L (ref 0–44)
AST: 30 U/L (ref 15–41)
Albumin: 4.5 g/dL (ref 3.5–5.0)
Alkaline Phosphatase: 48 U/L (ref 38–126)
Anion gap: 12 (ref 5–15)
BUN: 16 mg/dL (ref 8–23)
CO2: 25 mmol/L (ref 22–32)
Calcium: 10 mg/dL (ref 8.9–10.3)
Chloride: 100 mmol/L (ref 98–111)
Creatinine, Ser: 0.92 mg/dL (ref 0.44–1.00)
GFR calc Af Amer: >60 mL/min (ref >60)
GFR calc non Af Amer: 56 mL/min — ABNORMAL LOW (ref >60)
Glucose, Bld: 139 mg/dL — ABNORMAL HIGH (ref 70–99)
Potassium: 3.3 mmol/L — ABNORMAL LOW (ref 3.5–5.1)
Sodium: 137 mmol/L (ref 135–145)
Total Bilirubin: 1.1 mg/dL (ref 0.3–1.2)
Total Protein: 8.3 g/dL — ABNORMAL HIGH (ref 6.5–8.1)

## 2020-07-20 LAB — DIFFERENTIAL
Abs Immature Granulocytes: 0.03 10*3/uL (ref 0.00–0.07)
Basophils Absolute: 0.1 10*3/uL (ref 0.0–0.1)
Basophils Relative: 1 %
Eosinophils Absolute: 0.2 10*3/uL (ref 0.0–0.5)
Eosinophils Relative: 1 %
Immature Granulocytes: 0 %
Lymphocytes Relative: 33 %
Lymphs Abs: 3.5 10*3/uL (ref 0.7–4.0)
Monocytes Absolute: 0.6 10*3/uL (ref 0.1–1.0)
Monocytes Relative: 5 %
Neutro Abs: 6.4 10*3/uL (ref 1.7–7.7)
Neutrophils Relative %: 60 %

## 2020-07-20 LAB — ETHANOL: Alcohol, Ethyl (B): <10 mg/dL (ref <10)

## 2020-07-20 LAB — I-STAT CHEM 8, ED
BUN: 19 mg/dL (ref 8–23)
Calcium, Ion: 1.12 mmol/L — ABNORMAL LOW (ref 1.15–1.40)
Chloride: 102 mmol/L (ref 98–111)
Creatinine, Ser: 0.8 mg/dL (ref 0.44–1.00)
Glucose, Bld: 136 mg/dL — ABNORMAL HIGH (ref 70–99)
HCT: 42 % (ref 36.0–46.0)
Hemoglobin: 14.3 g/dL (ref 12.0–15.0)
Potassium: 3.3 mmol/L — ABNORMAL LOW (ref 3.5–5.1)
Sodium: 139 mmol/L (ref 135–145)
TCO2: 26 mmol/L (ref 22–32)

## 2020-07-20 LAB — RAPID URINE DRUG SCREEN, HOSP PERFORMED
Amphetamines: NOT DETECTED
Barbiturates: NOT DETECTED
Benzodiazepines: NOT DETECTED
Cocaine: NOT DETECTED
Opiates: NOT DETECTED
Tetrahydrocannabinol: NOT DETECTED

## 2020-07-20 LAB — PROTIME-INR
INR: 0.9 (ref 0.8–1.2)
Prothrombin Time: 12.2 seconds (ref 11.4–15.2)

## 2020-07-20 LAB — URINALYSIS, ROUTINE W REFLEX MICROSCOPIC
Bilirubin Urine: NEGATIVE
Glucose, UA: NEGATIVE mg/dL
Hgb urine dipstick: NEGATIVE
Ketones, ur: NEGATIVE mg/dL
Leukocytes,Ua: NEGATIVE
Nitrite: NEGATIVE
Protein, ur: NEGATIVE mg/dL
Specific Gravity, Urine: 1.013 (ref 1.005–1.030)
pH: 7 (ref 5.0–8.0)

## 2020-07-20 LAB — APTT: aPTT: 26 seconds (ref 24–36)

## 2020-07-20 LAB — CBG MONITORING, ED: Glucose-Capillary: 140 mg/dL — ABNORMAL HIGH (ref 70–99)

## 2020-07-20 IMAGING — CT CT HEAD CODE STROKE
3 series · 14 of 47 positions shown, 16 images · IV contrast (omnipaque)
Comparison: None.

CLINICAL DATA: Code stroke.  Left-sided weakness and slurred speech

EXAM:
CT HEAD WITHOUT CONTRAST
CT ANGIOGRAPHY OF THE HEAD AND NECK
TECHNIQUE: Contiguous axial images were obtained from the base of the skull
through the vertex without intravenous contrast. Multidetector CT
imaging of the head and neck was performed using the standard
protocol during bolus administration of intravenous contrast.
Multiplanar CT image reconstructions and MIPs were obtained to
evaluate the vascular anatomy. Carotid stenosis measurements (when
applicable) are obtained utilizing NASCET criteria, using the distal
internal carotid diameter as the denominator.
CONTRAST:  50mL OMNIPAQUE IOHEXOL 350 MG/ML SOLN

[Series 3: head 5.0 st · axial · 0.42mm/px · z∈[-75,+50]mm · 8 of 31 slices shown, 10 images]
[im 3/31  brain]
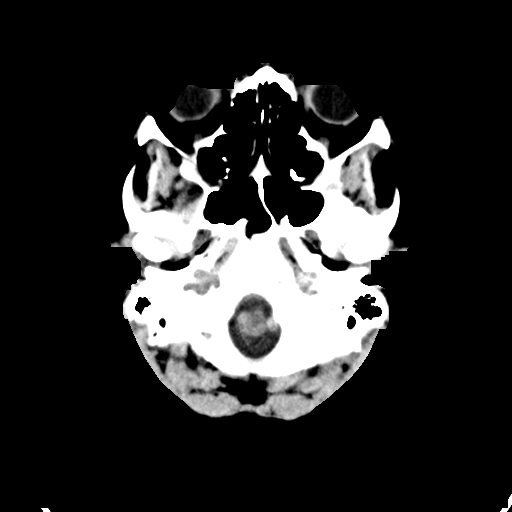
[im 3/31  bone]
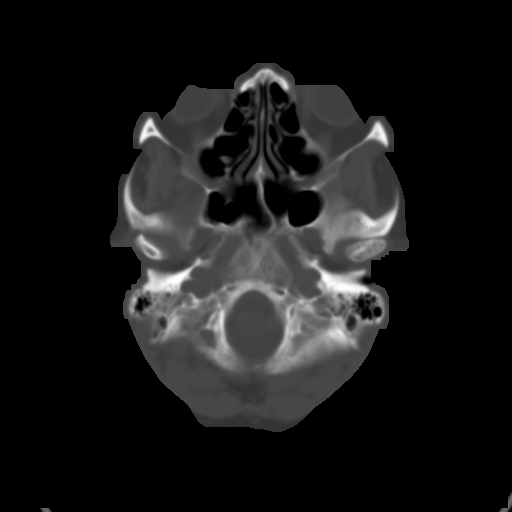
[im 7/31  brain]
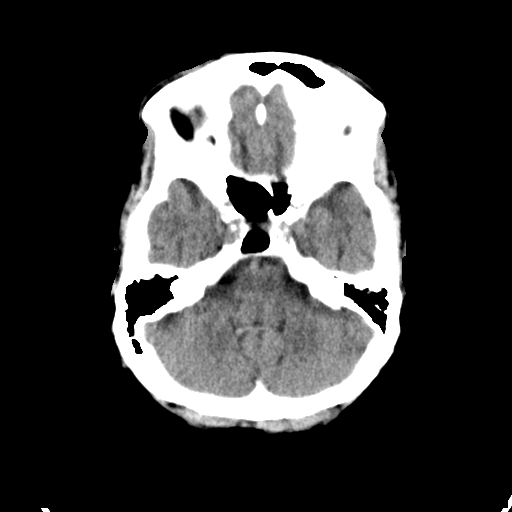
[im 10/31  brain]
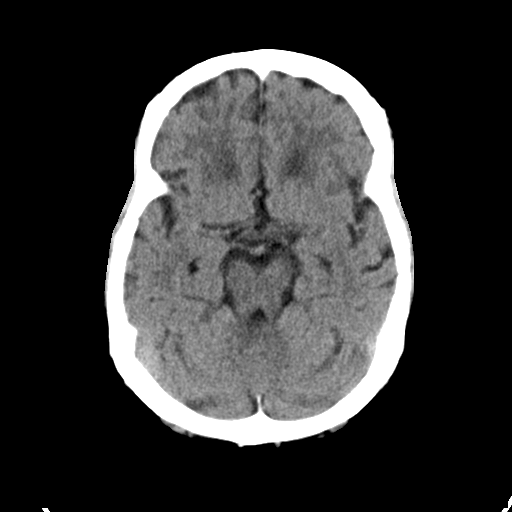
[im 14/31  brain]
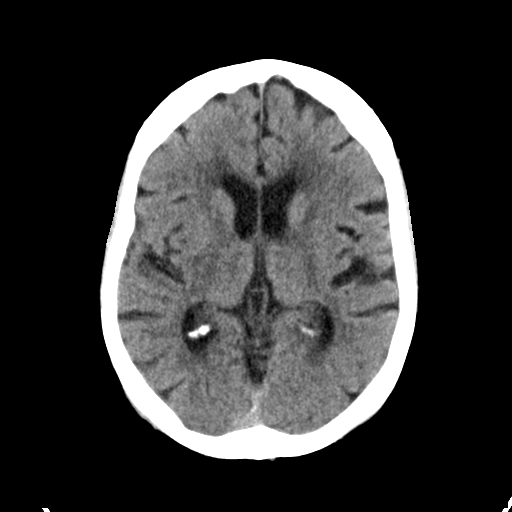
[im 17/31  brain]
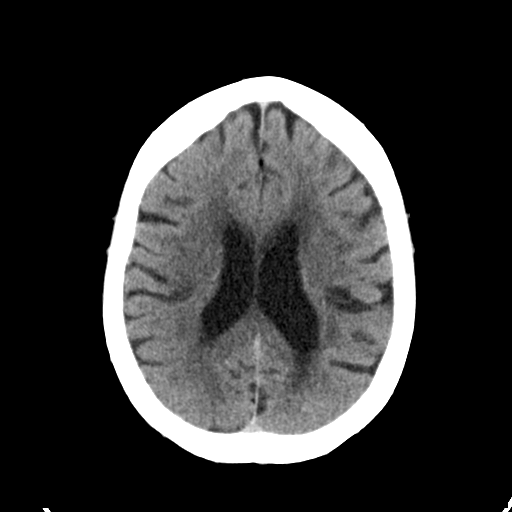
[im 17/31  bone]
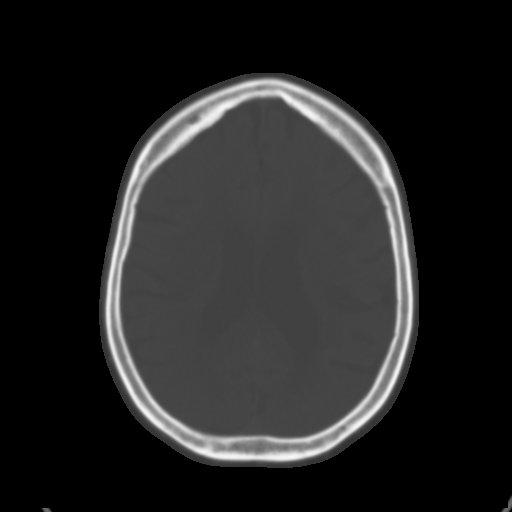
[im 21/31  brain]
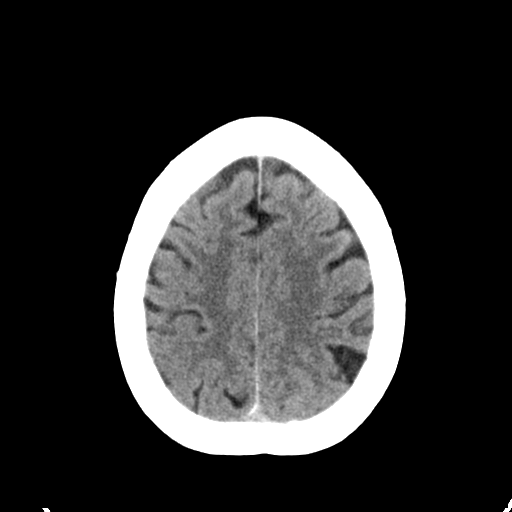
[im 24/31  brain]
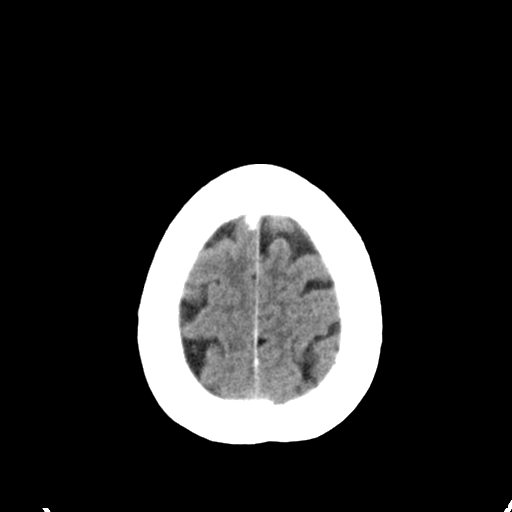
[im 28/31  brain]
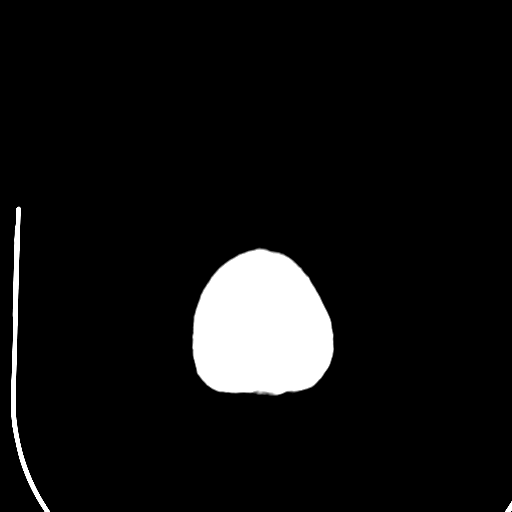

[Series 5: head 3.0 cor st · coronal · 0.29mm/px · 3 of 70 slices shown]
[im 24/70  brain]
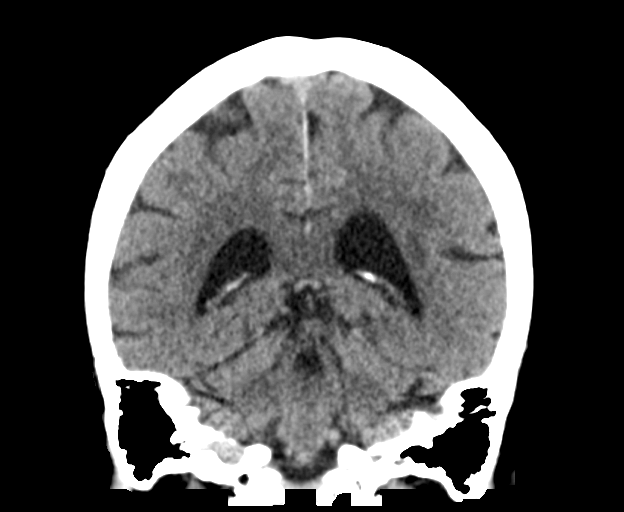
[im 31/70  brain]
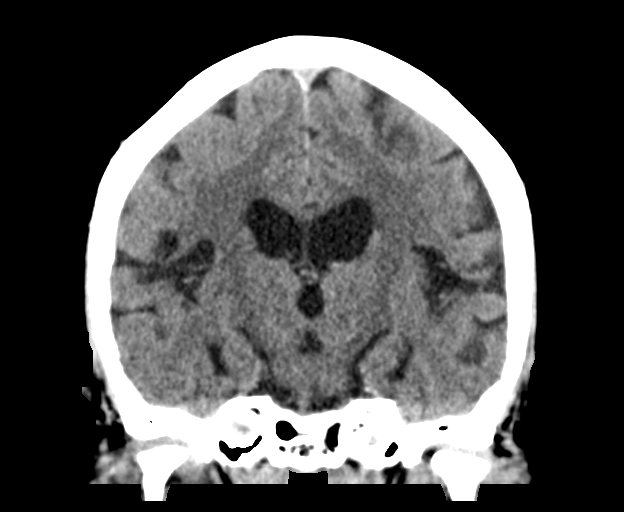
[im 39/70  brain]
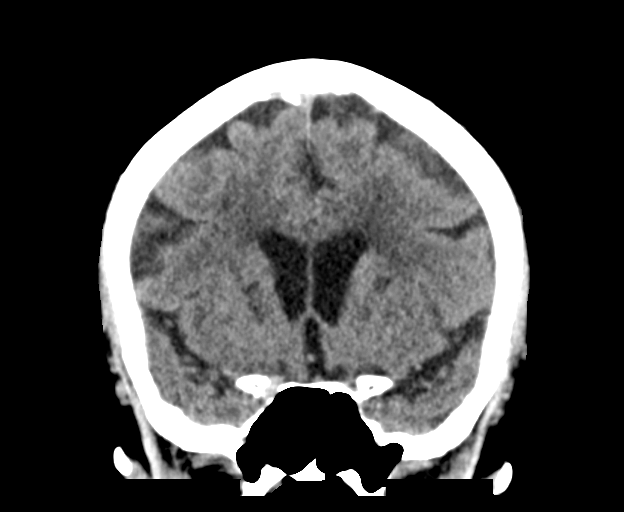

[Series 6: head 3.0 sag st · sagittal · 0.30mm/px · 3 of 54 slices shown]
[im 18/54  brain]
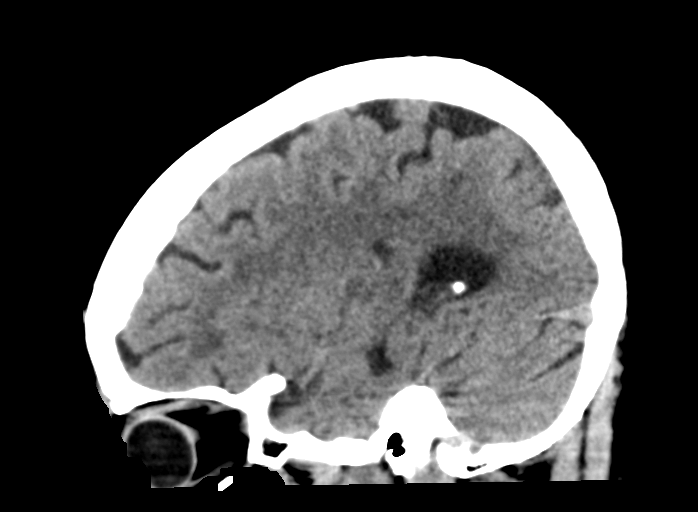
[im 27/54  brain]
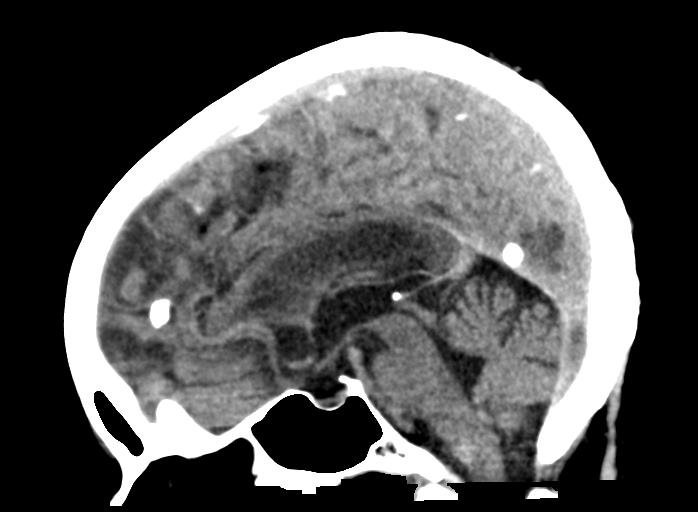
[im 36/54  brain]
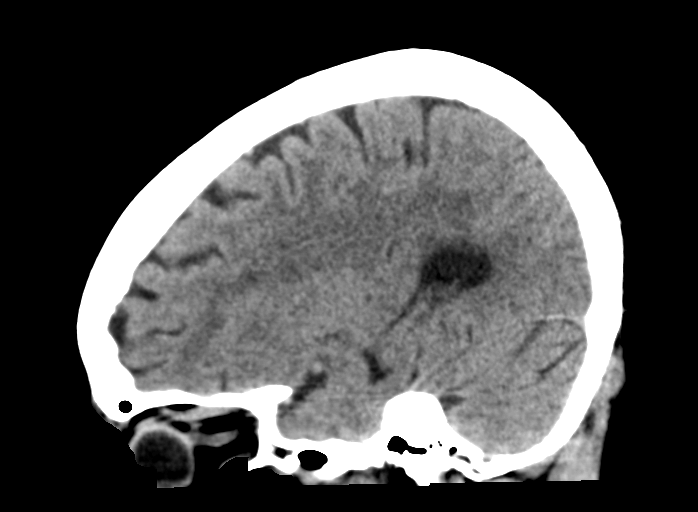

[14 of 47 positions shown; findings below may reference images not displayed]

FINDINGS: CT HEAD FINDINGS

Brain: There is no mass, hemorrhage or extra-axial collection. The
size and configuration of the ventricles and extra-axial CSF spaces
are normal. There is hypoattenuation of the periventricular white
matter, most commonly indicating chronic ischemic microangiopathy.

Vascular: No abnormal hyperdensity of the major intracranial
arteries or dural venous sinuses. No intracranial atherosclerosis.

Skull: The visualized skull base, calvarium and extracranial soft
tissues are normal.

Sinuses/Orbits: No fluid levels or advanced mucosal thickening of
the visualized paranasal sinuses. No mastoid or middle ear effusion.
The orbits are normal.

ASPECTS (Alberta Stroke Program Early CT Score)

- Ganglionic level infarction (caudate, lentiform nuclei, internal
capsule, insula, M1-M3 cortex): 7

- Supraganglionic infarction (M4-M6 cortex): 3

Total score (0-10 with 10 being normal): 10

CTA NECK FINDINGS

SKELETON: There is no bony spinal canal stenosis. No lytic or
blastic lesion.

OTHER NECK: Normal pharynx, larynx and major salivary glands. No
cervical lymphadenopathy. Unremarkable thyroid gland.

UPPER CHEST: No pneumothorax or pleural effusion. No nodules or
masses.

AORTIC ARCH:

There is calcific atherosclerosis of the aortic arch. There is no
aneurysm, dissection or hemodynamically significant stenosis of the
visualized portion of the aorta. Conventional 3 vessel aortic
branching pattern. The visualized proximal subclavian arteries are
widely patent.

RIGHT CAROTID SYSTEM: No dissection, occlusion or aneurysm. Mild
atherosclerotic calcification at the carotid bifurcation without
hemodynamically significant stenosis.

LEFT CAROTID SYSTEM: No dissection, occlusion or aneurysm. Mild
atherosclerotic calcification at the carotid bifurcation without
hemodynamically significant stenosis.

VERTEBRAL ARTERIES: Left dominant configuration. Both origins are
clearly patent. There is no dissection, occlusion or flow-limiting
stenosis to the skull base (V1-V3 segments).

CTA HEAD FINDINGS

POSTERIOR CIRCULATION:

--Vertebral arteries: Mild atherosclerotic calcification of the left
V4 segment. Otherwise normal.

--Inferior cerebellar arteries: Normal.

--Basilar artery: Normal.

--Superior cerebellar arteries: Normal.

--Posterior cerebral arteries (PCA): Normal.

ANTERIOR CIRCULATION:

--Intracranial internal carotid arteries: Normal.

--Anterior cerebral arteries (ACA): Normal. Both A1 segments are
present. Patent anterior communicating artery (a-comm).

--Middle cerebral arteries (MCA): Normal.

VENOUS SINUSES: As permitted by contrast timing, patent.

ANATOMIC VARIANTS: Hypoplastic right ACA A1 segment, a common
variant.

Review of the MIP images confirms the above findings.
IMPRESSION: 1. No emergent large vessel occlusion or high-grade stenosis of the
head or neck.

Aortic Atherosclerosis ([IQ]-[IQ]).

## 2020-07-20 IMAGING — CT CT ANGIO HEAD-NECK
1 of 13 series · 3 of 33 positions shown · IV contrast (OMNI 350)
Comparison: None.

CLINICAL DATA: Code stroke.  Left-sided weakness and slurred speech

EXAM:
CT HEAD WITHOUT CONTRAST
CT ANGIOGRAPHY OF THE HEAD AND NECK
TECHNIQUE: Contiguous axial images were obtained from the base of the skull
through the vertex without intravenous contrast. Multidetector CT
imaging of the head and neck was performed using the standard
protocol during bolus administration of intravenous contrast.
Multiplanar CT image reconstructions and MIPs were obtained to
evaluate the vascular anatomy. Carotid stenosis measurements (when
applicable) are obtained utilizing NASCET criteria, using the distal
internal carotid diameter as the denominator.
CONTRAST:  50mL OMNIPAQUE IOHEXOL 350 MG/ML SOLN

[Series 9: arterial thins · axial · arterial · 0.49mm/px · z∈[-215,-55]mm · 3 of 320 slices shown]
[im 80/320  soft-tissue]
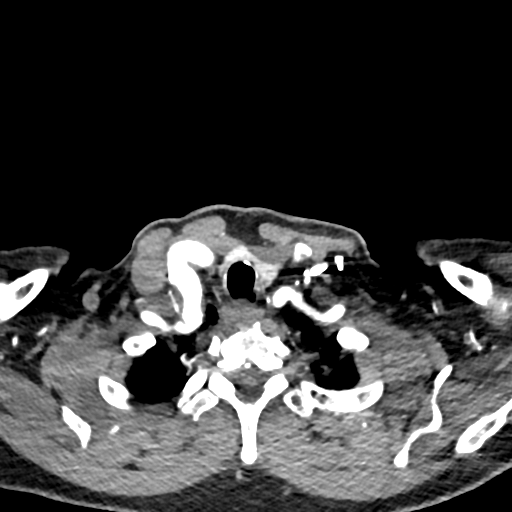
[im 160/320  bone]
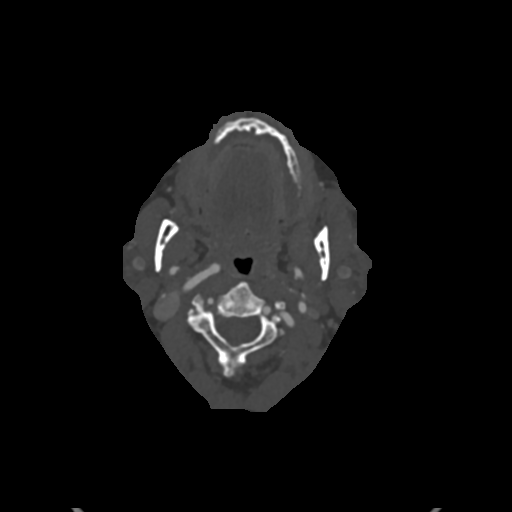
[im 240/320  soft-tissue]
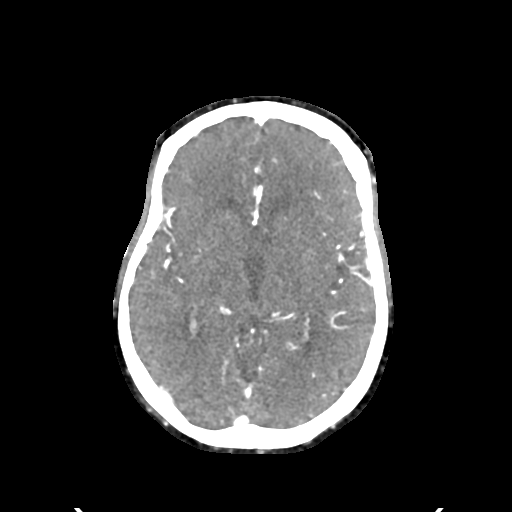

[3 of 33 positions shown; findings below may reference images not displayed]

FINDINGS: CT HEAD FINDINGS

Brain: There is no mass, hemorrhage or extra-axial collection. The
size and configuration of the ventricles and extra-axial CSF spaces
are normal. There is hypoattenuation of the periventricular white
matter, most commonly indicating chronic ischemic microangiopathy.

Vascular: No abnormal hyperdensity of the major intracranial
arteries or dural venous sinuses. No intracranial atherosclerosis.

Skull: The visualized skull base, calvarium and extracranial soft
tissues are normal.

Sinuses/Orbits: No fluid levels or advanced mucosal thickening of
the visualized paranasal sinuses. No mastoid or middle ear effusion.
The orbits are normal.

ASPECTS (Alberta Stroke Program Early CT Score)

- Ganglionic level infarction (caudate, lentiform nuclei, internal
capsule, insula, M1-M3 cortex): 7

- Supraganglionic infarction (M4-M6 cortex): 3

Total score (0-10 with 10 being normal): 10

CTA NECK FINDINGS

SKELETON: There is no bony spinal canal stenosis. No lytic or
blastic lesion.

OTHER NECK: Normal pharynx, larynx and major salivary glands. No
cervical lymphadenopathy. Unremarkable thyroid gland.

UPPER CHEST: No pneumothorax or pleural effusion. No nodules or
masses.

AORTIC ARCH:

There is calcific atherosclerosis of the aortic arch. There is no
aneurysm, dissection or hemodynamically significant stenosis of the
visualized portion of the aorta. Conventional 3 vessel aortic
branching pattern. The visualized proximal subclavian arteries are
widely patent.

RIGHT CAROTID SYSTEM: No dissection, occlusion or aneurysm. Mild
atherosclerotic calcification at the carotid bifurcation without
hemodynamically significant stenosis.

LEFT CAROTID SYSTEM: No dissection, occlusion or aneurysm. Mild
atherosclerotic calcification at the carotid bifurcation without
hemodynamically significant stenosis.

VERTEBRAL ARTERIES: Left dominant configuration. Both origins are
clearly patent. There is no dissection, occlusion or flow-limiting
stenosis to the skull base (V1-V3 segments).

CTA HEAD FINDINGS

POSTERIOR CIRCULATION:

--Vertebral arteries: Mild atherosclerotic calcification of the left
V4 segment. Otherwise normal.

--Inferior cerebellar arteries: Normal.

--Basilar artery: Normal.

--Superior cerebellar arteries: Normal.

--Posterior cerebral arteries (PCA): Normal.

ANTERIOR CIRCULATION:

--Intracranial internal carotid arteries: Normal.

--Anterior cerebral arteries (ACA): Normal. Both A1 segments are
present. Patent anterior communicating artery (a-comm).

--Middle cerebral arteries (MCA): Normal.

VENOUS SINUSES: As permitted by contrast timing, patent.

ANATOMIC VARIANTS: Hypoplastic right ACA A1 segment, a common
variant.

Review of the MIP images confirms the above findings.
IMPRESSION: 1. No emergent large vessel occlusion or high-grade stenosis of the
head or neck.

Aortic Atherosclerosis ([IQ]-[IQ]).

## 2020-07-20 IMAGING — MR MR HEAD W/O CM
12 of 13 series · 44 of 48 positions shown · non-contrast
Comparison: CT head, CTA head and neck earlier today.

CLINICAL DATA: 89-year-old female code stroke presentation, left
side weakness.

EXAM:
MRI HEAD WITHOUT CONTRAST
TECHNIQUE: Multiplanar, multiecho pulse sequences of the brain and surrounding
structures were obtained without intravenous contrast.

[Series 5: DWI · axial · 3.0mm · 0.88mm/px · z∈[-136,+4]mm · 7 of 100 slices shown (1 of 4)]
[im 1/100]
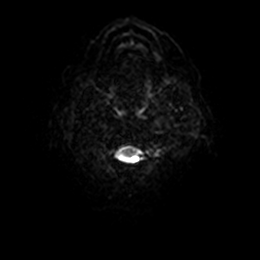
[im 17/100]
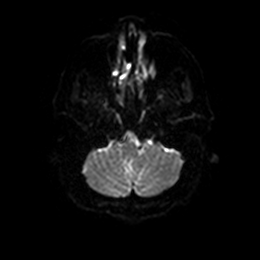
[im 34/100]
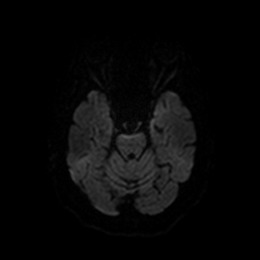
[im 50/100]
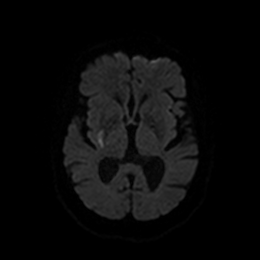
[im 67/100]
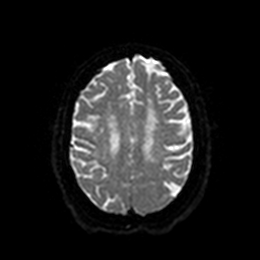
[im 83/100]
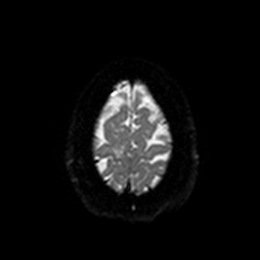
[im 100/100]
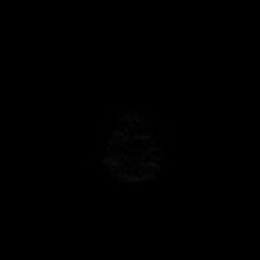

[Series 6: DWI · axial · 3.0mm · 0.88mm/px · z∈[-136,+4]mm · 4 of 50 slices shown (2 of 4)]
[im 1/50]
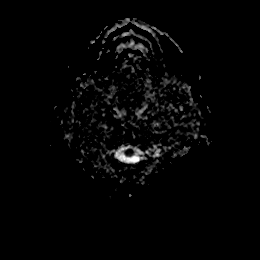
[im 17/50]
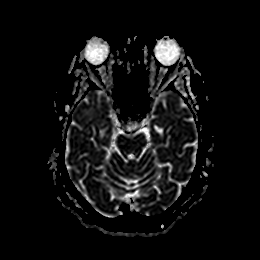
[im 33/50]
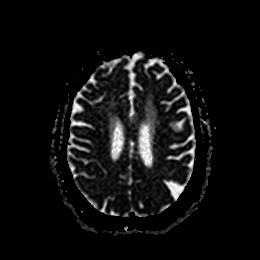
[im 50/50]
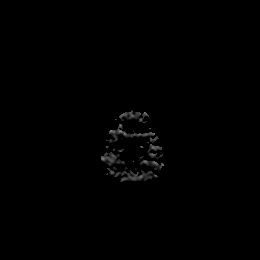

[Series 7: DWI · coronal · 4.0mm · 0.88mm/px · 6 of 72 slices shown (3 of 4)]
[im 1/72]
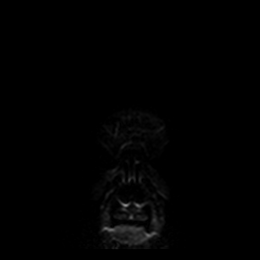
[im 15/72]
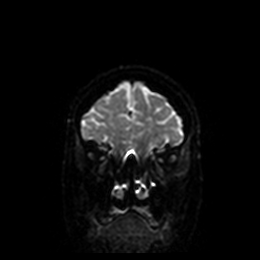
[im 29/72]
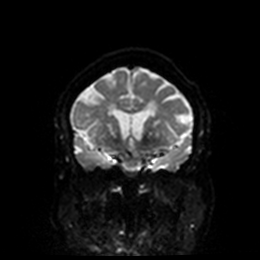
[im 43/72]
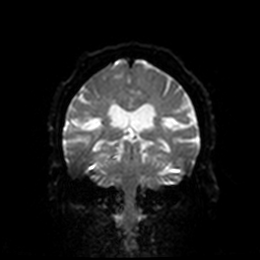
[im 57/72]
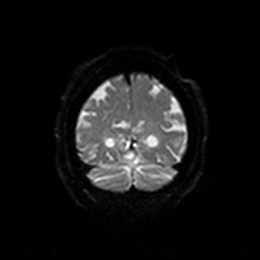
[im 72/72]
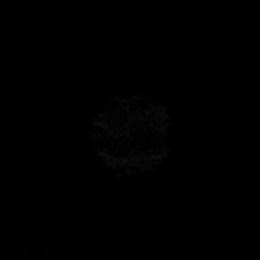

[Series 8: DWI · coronal · 4.0mm · 0.88mm/px · 3 of 36 slices shown (4 of 4)]
[im 1/36]
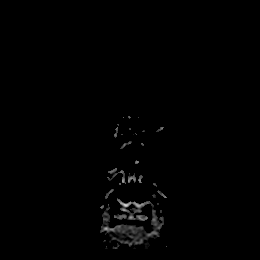
[im 18/36]
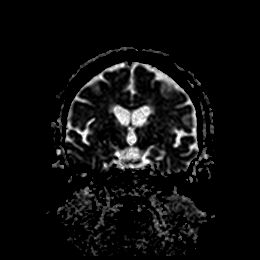
[im 36/36]
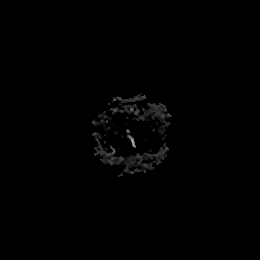

[Series 9: T1 · sagittal · 5.0mm · 0.75mm/px · 2 of 23 slices shown]
[im 1/23]
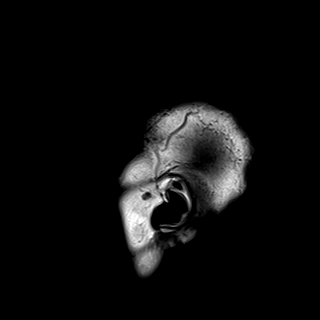
[im 23/23]
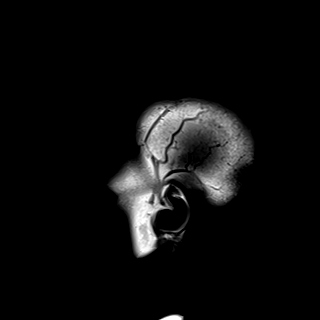

[Series 10: T2 · axial · 5.0mm · 0.72mm/px · z∈[-132,+6]mm · 2 of 25 slices shown (1 of 2)]
[im 1/25]
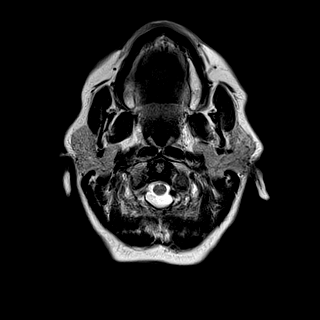
[im 25/25]
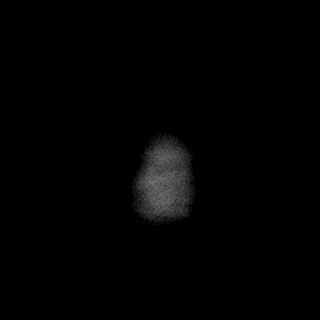

[Series 11: FLAIR · axial · 5.0mm · 0.45mm/px · z∈[-130,+7]mm · 2 of 25 slices shown]
[im 1/25]
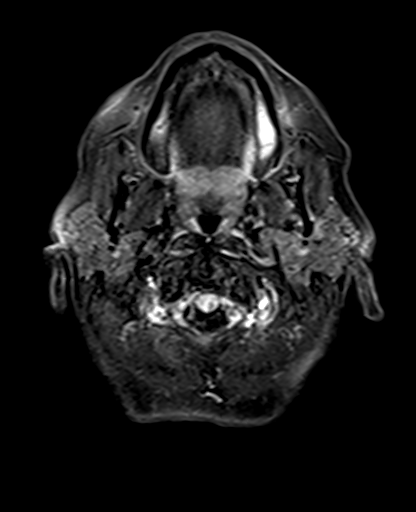
[im 25/25]
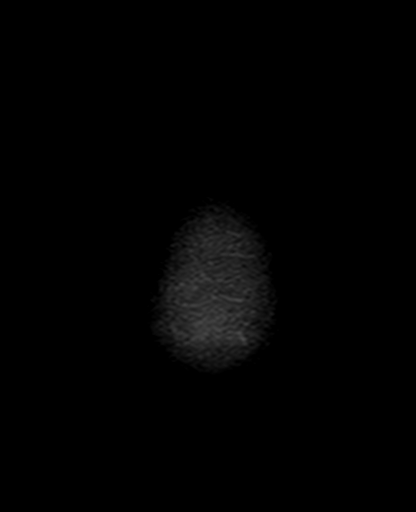

[Series 12: mag_images · axial · 3.0mm · 0.90mm/px · z∈[-138,+8]mm · 4 of 52 slices shown]
[im 1/52]
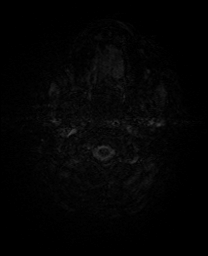
[im 18/52]
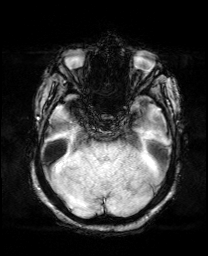
[im 35/52]
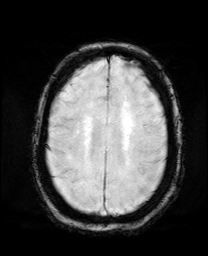
[im 52/52]
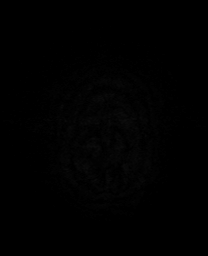

[Series 13: pha_images · axial · 3.0mm · 0.90mm/px · z∈[-138,+8]mm · 4 of 52 slices shown]
[im 1/52]
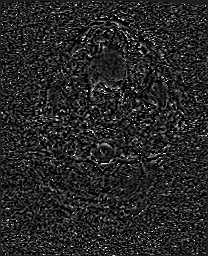
[im 18/52]
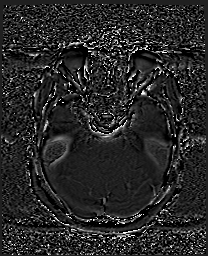
[im 35/52]
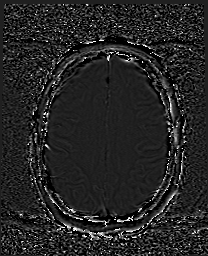
[im 52/52]
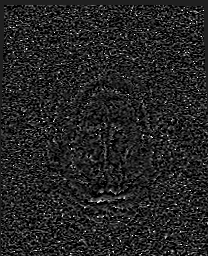

[Series 14: swi_images · axial · 3.0mm · 0.90mm/px · z∈[-138,+8]mm · 4 of 52 slices shown]
[im 1/52]
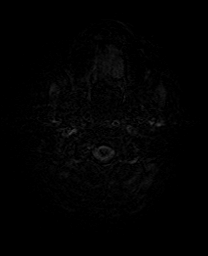
[im 18/52]
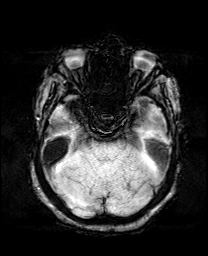
[im 35/52]
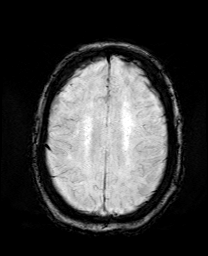
[im 52/52]
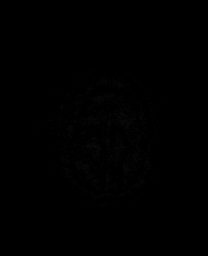

[Series 15: mip_images(sw) · axial · 24.0mm · 0.90mm/px · z∈[-128,-2]mm · 4 of 45 slices shown]
[im 1/45]
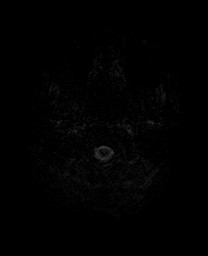
[im 15/45]
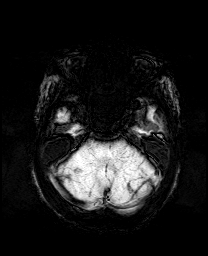
[im 30/45]
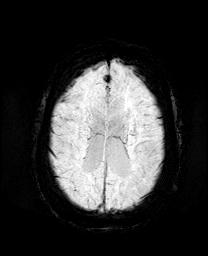
[im 45/45]
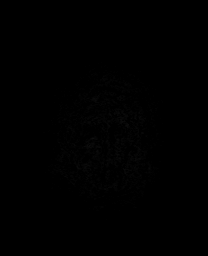

[Series 17: T2 · coronal · 5.0mm · 0.34mm/px · 2 of 29 slices shown (2 of 2)]
[im 1/29]
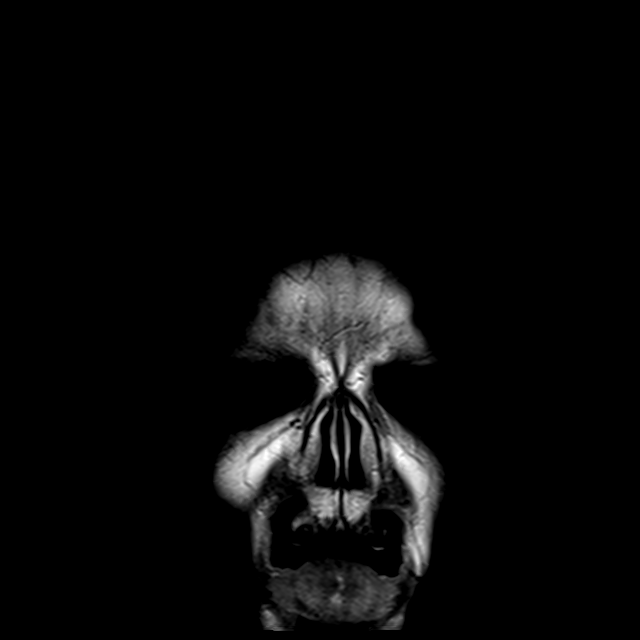
[im 29/29]
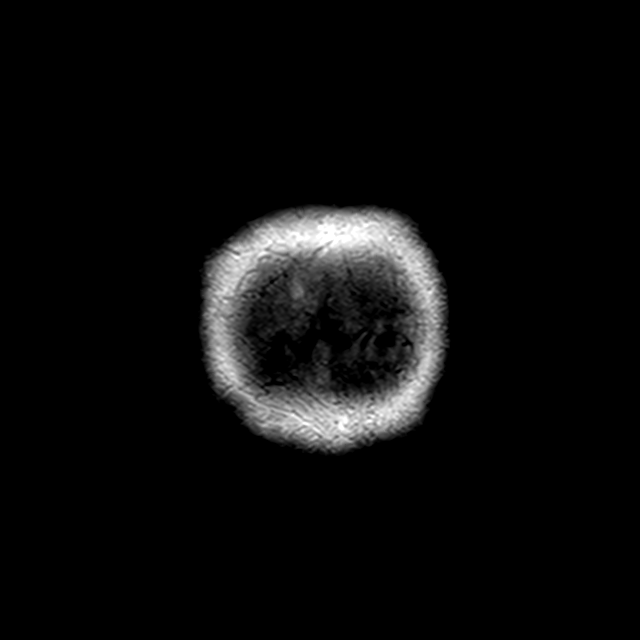

[44 of 48 positions shown; findings below may reference images not displayed]

FINDINGS: Brain: Discontinuous curvilinear restricted diffusion is noted from
the posterior right corona radiata tracking into the posterior right
lentiform, best seen on series 7, image 53). Mild if any associated
T2 and FLAIR hyperintensity. No hemorrhage or mass effect.

No other restricted diffusion. Patchy and scattered bilateral
cerebral white matter T2 and FLAIR hyperintensity elsewhere. No
cortical encephalomalacia. No chronic cerebral blood products.
Moderate T2 heterogeneity in the bilateral deep gray nuclei, most
resembling lacunar infarcts in the basal ganglia. Brainstem and
cerebellum are within normal limits for age.

No midline shift, mass effect, evidence of mass lesion,
ventriculomegaly, extra-axial collection or acute intracranial
hemorrhage. Cervicomedullary junction and pituitary are within
normal limits.

Vascular: Major intracranial vascular flow voids are preserved.

Skull and upper cervical spine: Partially visible multilevel
cervical spine degeneration. Mild degenerative spinal stenosis
suspected at C4-C5. Normal bone marrow signal.

Sinuses/Orbits: Postoperative changes to both globes, otherwise
negative.

Other: Visible internal auditory structures appear normal. Visible
scalp and face appear negative.
IMPRESSION: 1. Positive for acute small vessel infarct in the posterior right
corona radiata and lentiform. No associated hemorrhage or mass
effect.

2. Underlying chronic small vessel disease, including in the
bilateral basal ganglia.

3. Mild degenerative spinal stenosis suspected at C4-C5.

## 2020-07-20 MED ORDER — POTASSIUM CHLORIDE 10 MEQ/100ML IV SOLN
10.0000 meq | INTRAVENOUS | Status: AC
  Administered 2020-07-21 (×2): 10 meq via INTRAVENOUS
  Filled 2020-07-20 (×2): qty 100

## 2020-07-20 MED ORDER — CLOPIDOGREL BISULFATE 300 MG PO TABS
300.0000 mg | ORAL_TABLET | Freq: Once | ORAL | Status: AC
  Administered 2020-07-21: 300 mg via ORAL
  Filled 2020-07-20: qty 1

## 2020-07-20 MED ORDER — ASPIRIN 81 MG PO CHEW
81.0000 mg | CHEWABLE_TABLET | Freq: Once | ORAL | Status: AC
  Administered 2020-07-21: 81 mg via ORAL
  Filled 2020-07-20: qty 1

## 2020-07-20 MED ORDER — IOHEXOL 350 MG/ML SOLN
50.0000 mL | Freq: Once | INTRAVENOUS | Status: AC | PRN
  Administered 2020-07-20: 50 mL via INTRAVENOUS

## 2020-07-20 NOTE — ED Provider Notes (Signed)
[facility] EMERGENCY DEPARTMENT Provider Note   CSN: [id number] Arrival date & time: 07/20/20  1959  An emergency department physician performed an initial assessment on this suspected stroke patient at 2002.  History Chief Complaint  Patient presents with  . Code Stroke    Nicole [name] is a 86 y.o. female presenting from urgent care left-sided weakness.  She reports onset of left-sided weakness and slurred speech today around 2:30 PM.  No prior history of stroke or blood thinner use.  Code stroke called upon arrival.  Patient seen by neurologist and taken to CT.  Patient denies to me any headache, neck pain, blurred vision.  Additional history is provided by the patient's daughter at bedside.  Reports that her mother has had left knee pain for a long time to get steroid injections here.  However she has never had this kind of weakness or foot drag in her left leg, she has never had weakness in her left arm.  Her mother is otherwise very healthy and lives independently.  She takes a baby aspirin daily.  She has no prior history of stroke.  She does not smoke.  She is vaccinated for COVID-19.    HPI     History reviewed. No pertinent past medical history.  Patient Active Problem List   Diagnosis Date Noted  . Hypokalemia 07/20/2020  . CVA (cerebral vascular accident) (HCC) 07/20/2020  . Essential hypertension 07/20/2020  . Hypothyroidism 07/20/2020    History reviewed. No pertinent surgical history.   OB History   No obstetric history on file.     Family History  Problem Relation Age of Onset  . Diabetes Mother   . Diabetes Other   . Hypertension Other     Social History   Tobacco Use  . Smoking status: Never Smoker  . Smokeless tobacco: Never Used  Substance Use Topics  . Alcohol use: No    Alcohol/week: 0.0 standard drinks  . Drug use: No    Home Medications Prior to Admission medications   Medication Sig Start Date End Date Taking?  Authorizing Provider  amLODipine (NORVASC) 10 MG tablet Take 10 mg by mouth daily. 06/08/15  Yes [provider]  aspirin 81 MG EC tablet Take 81 mg by mouth daily.  08/28/07  Yes [provider]  cetirizine (ZYRTEC) 10 MG tablet Take 10 mg by mouth daily as needed for allergies or rhinitis.   Yes [provider]  Cholecalciferol (VITAMIN D-3 PO) Take 1 capsule by mouth daily.   Yes [provider]  Cyanocobalamin (VITAMIN B-12 PO) Take 1 tablet by mouth daily.   Yes [provider]  diclofenac Sodium (VOLTAREN) 1 % GEL Apply 2 g topically 4 (four) times daily as needed (to affected areas- for pain).    Yes [provider]  hydrochlorothiazide (HYDRODIURIL) 25 MG tablet Take 25 mg by mouth daily. 04/21/15  Yes [provider]  levothyroxine (SYNTHROID) 50 MCG tablet Take 50 mcg by mouth daily before breakfast.  07/11/20  Yes [provider]  Multiple Vitamins-Minerals (CENTRUM SILVER ULTRA WOMENS) TABS Take 1 tablet by mouth daily with breakfast.   Yes [provider]  oxybutynin (DITROPAN-XL) 5 MG 24 hr tablet Take 5 mg by mouth at bedtime.  07/13/20  Yes [provider]  pravastatin (PRAVACHOL) 40 MG tablet Take 40 mg by mouth daily.  07/02/15  Yes [provider]  PROAIR HFA 108 (90 BASE) MCG/ACT inhaler Inhale 1 puff into  the lungs every 4 (four) hours as needed for wheezing or shortness of breath.  04/18/15  Yes [provider]  cefdinir (OMNICEF) 300 MG capsule Take 300 mg by mouth 2 (two) times daily. Patient not taking: Reported on 07/20/2020 04/18/15   [provider]  celecoxib (CELEBREX) 200 MG capsule  06/16/13   [provider]  celecoxib (CELEBREX) 200 MG capsule Take 200 mg by mouth daily. Patient not taking: Reported on 07/20/2020 07/10/20   [provider]  Diclofenac Sodium CR 100 MG 24 hr tablet  06/08/15   [provider]  ibandronate (BONIVA) 150 MG  tablet Take by mouth. Patient not taking: Reported on 07/20/2020    [provider]  levothyroxine (SYNTHROID, LEVOTHROID) 25 MCG tablet Take 25 mcg by mouth daily. Patient not taking: Reported on 07/20/2020 06/28/15   [provider]    Allergies    Patient has no known allergies.  Review of Systems   Review of Systems  Constitutional: Negative for chills and fever.  HENT: Negative for ear pain and sore throat.   Eyes: Negative for pain and visual disturbance.  Respiratory: Negative for cough and shortness of breath.   Cardiovascular: Negative for chest pain and palpitations.  Gastrointestinal: Negative for abdominal pain and vomiting.  Genitourinary: Negative for dysuria and hematuria.  Musculoskeletal: Negative for arthralgias and back pain.  Skin: Negative for color change and rash.  Neurological: Positive for speech difficulty, weakness, light-headedness and numbness. Negative for tremors and seizures.  Psychiatric/Behavioral: Negative for agitation and confusion.  All other systems reviewed and are negative.   Physical Exam Updated Vital Signs BP (!) 154/80   Pulse 80   Temp 97.9 F (36.6 C) (Oral)   Resp (!) 25   SpO2 96%   Physical Exam Vitals and nursing note reviewed.  Constitutional:      General: She is not in acute distress.    Appearance: She is well-developed.  HENT:     Head: Normocephalic and atraumatic.  Eyes:     Conjunctiva/sclera: Conjunctivae normal.  Cardiovascular:     Rate and Rhythm: Normal rate and regular rhythm.     Pulses: Normal pulses.  Pulmonary:     Effort: Pulmonary effort is normal. No respiratory distress.     Breath sounds: Normal breath sounds.  Abdominal:     Palpations: Abdomen is soft.     Tenderness: There is no abdominal tenderness.  Musculoskeletal:     Cervical back: Neck supple.  Skin:    General: Skin is warm and dry.  Neurological:     Mental Status: She is alert.     Comments: 2/5 strength in  left upper and lower extremity GCS 15 Paresthesias in left upper and lower extremity  Psychiatric:        Mood and Affect: Mood normal.        Behavior: Behavior normal.     ED Results / Procedures / Treatments   Labs (all labs ordered are listed, but only abnormal results are displayed) Labs Reviewed  CBC - Abnormal; Notable for the following components:      Result Value   WBC 10.8 (*)    All other components within normal limits  COMPREHENSIVE METABOLIC PANEL - Abnormal; Notable for the following components:   Potassium 3.3 (*)    Glucose, Bld 139 (*)    Total Protein 8.3 (*)    GFR calc non Af Amer 56 (*)    All other components within normal  limits  URINALYSIS, ROUTINE W REFLEX MICROSCOPIC - Abnormal; Notable for the following components:   Color, Urine STRAW (*)    All other components within normal limits  VITAMIN B12 - Abnormal; Notable for the following components:   Vitamin B-12 1,114 (*)    All other components within normal limits  LIPID PANEL - Abnormal; Notable for the following components:   LDL Cholesterol 125 (*)    All other components within normal limits  URINALYSIS, DIPSTICK ONLY - Abnormal; Notable for the following components:   Color, Urine STRAW (*)    All other components within normal limits  I-STAT CHEM 8, ED - Abnormal; Notable for the following components:   Potassium 3.3 (*)    Glucose, Bld 136 (*)    Calcium, Ion 1.12 (*)    All other components within normal limits  SARS CORONAVIRUS 2 BY RT PCR (HOSPITAL ORDER, PERFORMED IN [facility])  ETHANOL  PROTIME-INR  APTT  DIFFERENTIAL  RAPID URINE DRUG SCREEN, HOSP PERFORMED  HEMOGLOBIN A1C  MAGNESIUM  LIPID PANEL    EKG EKG Interpretation  Date/Time:  Thursday July 21 2020 02:23:35 EDT Ventricular Rate:  72 PR Interval:    QRS Duration: 94 QT Interval:  443 QTC Calculation: 485 R Axis:   8 Text Interpretation: Sinus rhythm Borderline T wave abnormalities No  significant change was found Confirmed by Glynn Octave [phone]) on 07/21/2020 2:30:30 AM Also confirmed by Glynn Octave [phone]), editor Jac Canavan, [place] ([id number])  on 07/21/2020 7:07:21 AM   Radiology DG Chest 2 View  Result Date: 07/21/2020 CLINICAL DATA:  CVA symptoms EXAM: CHEST - 2 VIEW COMPARISON:  None. FINDINGS: Frontal and lateral views of the chest demonstrate an unremarkable cardiac silhouette. Dense atherosclerosis of the aortic arch. No airspace disease, effusion, or pneumothorax. No acute bony abnormalities. IMPRESSION: 1. No acute intrathoracic process. Electronically Signed   By: Sharlet Salina M.D.   On: 07/21/2020 02:18   MR BRAIN WO CONTRAST  Result Date: 07/20/2020 CLINICAL DATA:  86 year old female code stroke presentation, left side weakness. EXAM: MRI HEAD WITHOUT CONTRAST TECHNIQUE: Multiplanar, multiecho pulse sequences of the brain and surrounding structures were obtained without intravenous contrast. COMPARISON:  CT head, CTA head and neck earlier today. FINDINGS: Brain: Discontinuous curvilinear restricted diffusion is noted from the posterior right corona radiata tracking into the posterior right lentiform, best seen on series 7, image 53). Mild if any associated T2 and FLAIR hyperintensity. No hemorrhage or mass effect. No other restricted diffusion. Patchy and scattered bilateral cerebral white matter T2 and FLAIR hyperintensity elsewhere. No cortical encephalomalacia. No chronic cerebral blood products. Moderate T2 heterogeneity in the bilateral deep gray nuclei, most resembling lacunar infarcts in the basal ganglia. Brainstem and cerebellum are within normal limits for age. No midline shift, mass effect, evidence of mass lesion, ventriculomegaly, extra-axial collection or acute intracranial hemorrhage. Cervicomedullary junction and pituitary are within normal limits. Vascular: Major intracranial vascular flow voids are preserved. Skull and upper cervical spine:  Partially visible multilevel cervical spine degeneration. Mild degenerative spinal stenosis suspected at C4-C5. Normal bone marrow signal. Sinuses/Orbits: Postoperative changes to both globes, otherwise negative. Other: Visible internal auditory structures appear normal. Visible scalp and face appear negative. IMPRESSION: 1. Positive for acute small vessel infarct in the posterior right corona radiata and lentiform. No associated hemorrhage or mass effect. 2. Underlying chronic small vessel disease, including in the bilateral basal ganglia. 3. Mild degenerative spinal stenosis suspected at C4-C5. Electronically Signed   By: Rexene Edison  Margo Aye M.D.   On: 07/20/2020 22:47   CT HEAD CODE STROKE WO CONTRAST  Result Date: 07/20/2020 CLINICAL DATA:  Code stroke.  Left-sided weakness and slurred speech EXAM: CT HEAD WITHOUT CONTRAST CT ANGIOGRAPHY OF THE HEAD AND NECK TECHNIQUE: Contiguous axial images were obtained from the base of the skull through the vertex without intravenous contrast. Multidetector CT imaging of the head and neck was performed using the standard protocol during bolus administration of intravenous contrast. Multiplanar CT image reconstructions and MIPs were obtained to evaluate the vascular anatomy. Carotid stenosis measurements (when applicable) are obtained utilizing NASCET criteria, using the distal internal carotid diameter as the denominator. CONTRAST:  36mL OMNIPAQUE IOHEXOL 350 MG/ML SOLN COMPARISON:  None. FINDINGS: CT HEAD FINDINGS Brain: There is no mass, hemorrhage or extra-axial collection. The size and configuration of the ventricles and extra-axial CSF spaces are normal. There is hypoattenuation of the periventricular white matter, most commonly indicating chronic ischemic microangiopathy. Vascular: No abnormal hyperdensity of the major intracranial arteries or dural venous sinuses. No intracranial atherosclerosis. Skull: The visualized skull base, calvarium and extracranial soft tissues are  normal. Sinuses/Orbits: No fluid levels or advanced mucosal thickening of the visualized paranasal sinuses. No mastoid or middle ear effusion. The orbits are normal. ASPECTS (Alberta Stroke Program Early CT Score) - Ganglionic level infarction (caudate, lentiform nuclei, internal capsule, insula, M1-M3 cortex): 7 - Supraganglionic infarction (M4-M6 cortex): 3 Total score (0-10 with 10 being normal): 10 CTA NECK FINDINGS SKELETON: There is no bony spinal canal stenosis. No lytic or blastic lesion. OTHER NECK: Normal pharynx, larynx and major salivary glands. No cervical lymphadenopathy. Unremarkable thyroid gland. UPPER CHEST: No pneumothorax or pleural effusion. No nodules or masses. AORTIC ARCH: There is calcific atherosclerosis of the aortic arch. There is no aneurysm, dissection or hemodynamically significant stenosis of the visualized portion of the aorta. Conventional 3 vessel aortic branching pattern. The visualized proximal subclavian arteries are widely patent. RIGHT CAROTID SYSTEM: No dissection, occlusion or aneurysm. Mild atherosclerotic calcification at the carotid bifurcation without hemodynamically significant stenosis. LEFT CAROTID SYSTEM: No dissection, occlusion or aneurysm. Mild atherosclerotic calcification at the carotid bifurcation without hemodynamically significant stenosis. VERTEBRAL ARTERIES: Left dominant configuration. Both origins are clearly patent. There is no dissection, occlusion or flow-limiting stenosis to the skull base (V1-V3 segments). CTA HEAD FINDINGS POSTERIOR CIRCULATION: --Vertebral arteries: Mild atherosclerotic calcification of the left V4 segment. Otherwise normal. --Inferior cerebellar arteries: Normal. --Basilar artery: Normal. --Superior cerebellar arteries: Normal. --Posterior cerebral arteries (PCA): Normal. ANTERIOR CIRCULATION: --Intracranial internal carotid arteries: Normal. --Anterior cerebral arteries (ACA): Normal. Both A1 segments are present. Patent anterior  communicating artery (a-comm). --Middle cerebral arteries (MCA): Normal. VENOUS SINUSES: As permitted by contrast timing, patent. ANATOMIC VARIANTS: Hypoplastic right ACA A1 segment, a common variant. Review of the MIP images confirms the above findings. IMPRESSION: 1. No emergent large vessel occlusion or high-grade stenosis of the head or neck. Aortic Atherosclerosis (ICD10-I70.0). Electronically Signed   By: Deatra Robinson M.D.   On: 07/20/2020 20:34   CT ANGIO HEAD CODE STROKE  Result Date: 07/20/2020 CLINICAL DATA:  Code stroke.  Left-sided weakness and slurred speech EXAM: CT HEAD WITHOUT CONTRAST CT ANGIOGRAPHY OF THE HEAD AND NECK TECHNIQUE: Contiguous axial images were obtained from the base of the skull through the vertex without intravenous contrast. Multidetector CT imaging of the head and neck was performed using the standard protocol during bolus administration of intravenous contrast. Multiplanar CT image reconstructions and MIPs were obtained to evaluate the vascular anatomy. Carotid stenosis  measurements (when applicable) are obtained utilizing NASCET criteria, using the distal internal carotid diameter as the denominator. CONTRAST:  50mL OMNIPAQUE IOHEXOL 350 MG/ML SOLN COMPARISON:  None. FINDINGS: CT HEAD FINDINGS Brain: There is no mass, hemorrhage or extra-axial collection. The size and configuration of the ventricles and extra-axial CSF spaces are normal. There is hypoattenuation of the periventricular white matter, most commonly indicating chronic ischemic microangiopathy. Vascular: No abnormal hyperdensity of the major intracranial arteries or dural venous sinuses. No intracranial atherosclerosis. Skull: The visualized skull base, calvarium and extracranial soft tissues are normal. Sinuses/Orbits: No fluid levels or advanced mucosal thickening of the visualized paranasal sinuses. No mastoid or middle ear effusion. The orbits are normal. ASPECTS (Alberta Stroke Program Early CT Score) -  Ganglionic level infarction (caudate, lentiform nuclei, internal capsule, insula, M1-M3 cortex): 7 - Supraganglionic infarction (M4-M6 cortex): 3 Total score (0-10 with 10 being normal): 10 CTA NECK FINDINGS SKELETON: There is no bony spinal canal stenosis. No lytic or blastic lesion. OTHER NECK: Normal pharynx, larynx and major salivary glands. No cervical lymphadenopathy. Unremarkable thyroid gland. UPPER CHEST: No pneumothorax or pleural effusion. No nodules or masses. AORTIC ARCH: There is calcific atherosclerosis of the aortic arch. There is no aneurysm, dissection or hemodynamically significant stenosis of the visualized portion of the aorta. Conventional 3 vessel aortic branching pattern. The visualized proximal subclavian arteries are widely patent. RIGHT CAROTID SYSTEM: No dissection, occlusion or aneurysm. Mild atherosclerotic calcification at the carotid bifurcation without hemodynamically significant stenosis. LEFT CAROTID SYSTEM: No dissection, occlusion or aneurysm. Mild atherosclerotic calcification at the carotid bifurcation without hemodynamically significant stenosis. VERTEBRAL ARTERIES: Left dominant configuration. Both origins are clearly patent. There is no dissection, occlusion or flow-limiting stenosis to the skull base (V1-V3 segments). CTA HEAD FINDINGS POSTERIOR CIRCULATION: --Vertebral arteries: Mild atherosclerotic calcification of the left V4 segment. Otherwise normal. --Inferior cerebellar arteries: Normal. --Basilar artery: Normal. --Superior cerebellar arteries: Normal. --Posterior cerebral arteries (PCA): Normal. ANTERIOR CIRCULATION: --Intracranial internal carotid arteries: Normal. --Anterior cerebral arteries (ACA): Normal. Both A1 segments are present. Patent anterior communicating artery (a-comm). --Middle cerebral arteries (MCA): Normal. VENOUS SINUSES: As permitted by contrast timing, patent. ANATOMIC VARIANTS: Hypoplastic right ACA A1 segment, a common variant. Review of the  MIP images confirms the above findings. IMPRESSION: 1. No emergent large vessel occlusion or high-grade stenosis of the head or neck. Aortic Atherosclerosis (ICD10-I70.0). Electronically Signed   By: Deatra Robinson M.D.   On: 07/20/2020 20:34   CT ANGIO NECK CODE STROKE  Result Date: 07/20/2020 CLINICAL DATA:  Code stroke.  Left-sided weakness and slurred speech EXAM: CT HEAD WITHOUT CONTRAST CT ANGIOGRAPHY OF THE HEAD AND NECK TECHNIQUE: Contiguous axial images were obtained from the base of the skull through the vertex without intravenous contrast. Multidetector CT imaging of the head and neck was performed using the standard protocol during bolus administration of intravenous contrast. Multiplanar CT image reconstructions and MIPs were obtained to evaluate the vascular anatomy. Carotid stenosis measurements (when applicable) are obtained utilizing NASCET criteria, using the distal internal carotid diameter as the denominator. CONTRAST:  50mL OMNIPAQUE IOHEXOL 350 MG/ML SOLN COMPARISON:  None. FINDINGS: CT HEAD FINDINGS Brain: There is no mass, hemorrhage or extra-axial collection. The size and configuration of the ventricles and extra-axial CSF spaces are normal. There is hypoattenuation of the periventricular white matter, most commonly indicating chronic ischemic microangiopathy. Vascular: No abnormal hyperdensity of the major intracranial arteries or dural venous sinuses. No intracranial atherosclerosis. Skull: The visualized skull base, calvarium and extracranial soft tissues are  normal. Sinuses/Orbits: No fluid levels or advanced mucosal thickening of the visualized paranasal sinuses. No mastoid or middle ear effusion. The orbits are normal. ASPECTS (Alberta Stroke Program Early CT Score) - Ganglionic level infarction (caudate, lentiform nuclei, internal capsule, insula, M1-M3 cortex): 7 - Supraganglionic infarction (M4-M6 cortex): 3 Total score (0-10 with 10 being normal): 10 CTA NECK FINDINGS SKELETON:  There is no bony spinal canal stenosis. No lytic or blastic lesion. OTHER NECK: Normal pharynx, larynx and major salivary glands. No cervical lymphadenopathy. Unremarkable thyroid gland. UPPER CHEST: No pneumothorax or pleural effusion. No nodules or masses. AORTIC ARCH: There is calcific atherosclerosis of the aortic arch. There is no aneurysm, dissection or hemodynamically significant stenosis of the visualized portion of the aorta. Conventional 3 vessel aortic branching pattern. The visualized proximal subclavian arteries are widely patent. RIGHT CAROTID SYSTEM: No dissection, occlusion or aneurysm. Mild atherosclerotic calcification at the carotid bifurcation without hemodynamically significant stenosis. LEFT CAROTID SYSTEM: No dissection, occlusion or aneurysm. Mild atherosclerotic calcification at the carotid bifurcation without hemodynamically significant stenosis. VERTEBRAL ARTERIES: Left dominant configuration. Both origins are clearly patent. There is no dissection, occlusion or flow-limiting stenosis to the skull base (V1-V3 segments). CTA HEAD FINDINGS POSTERIOR CIRCULATION: --Vertebral arteries: Mild atherosclerotic calcification of the left V4 segment. Otherwise normal. --Inferior cerebellar arteries: Normal. --Basilar artery: Normal. --Superior cerebellar arteries: Normal. --Posterior cerebral arteries (PCA): Normal. ANTERIOR CIRCULATION: --Intracranial internal carotid arteries: Normal. --Anterior cerebral arteries (ACA): Normal. Both A1 segments are present. Patent anterior communicating artery (a-comm). --Middle cerebral arteries (MCA): Normal. VENOUS SINUSES: As permitted by contrast timing, patent. ANATOMIC VARIANTS: Hypoplastic right ACA A1 segment, a common variant. Review of the MIP images confirms the above findings. IMPRESSION: 1. No emergent large vessel occlusion or high-grade stenosis of the head or neck. Aortic Atherosclerosis (ICD10-I70.0). Electronically Signed   By: Deatra RobinsonKevin  Herman M.D.    On: 07/20/2020 20:34    Procedures .Critical Care Performed by: Terald Sleeperrifan, Yandiel Bergum J, MD Authorized by: Terald Sleeperrifan, Lior Hoen J, MD   Critical care provider statement:    Critical care time (minutes):  35   Critical care was necessary to treat or prevent imminent or life-threatening deterioration of the following conditions:  CNS failure or compromise   Critical care was time spent personally by me on the following activities:  Discussions with consultants, evaluation of patient's response to treatment, examination of patient, ordering and performing treatments and interventions, ordering and review of laboratory studies, ordering and review of radiographic studies, pulse oximetry, re-evaluation of patient's condition, obtaining history from patient or surrogate and review of old charts Comments:     Stroke evaluation, consideration of tPA   (including critical care time)  Medications Ordered in ED Medications  levothyroxine (SYNTHROID) tablet 50 mcg (has no administration in time range)  oxybutynin (DITROPAN-XL) 24 hr tablet 5 mg (5 mg Oral Not Given 07/21/20 0137)  albuterol (VENTOLIN HFA) 108 (90 Base) MCG/ACT inhaler 1 puff (has no administration in time range)  0.9 %  sodium chloride infusion ( Intravenous New Bag/Given 07/21/20 0209)  acetaminophen (TYLENOL) tablet 650 mg (has no administration in time range)    Or  acetaminophen (TYLENOL) 160 MG/5ML solution 650 mg (has no administration in time range)    Or  acetaminophen (TYLENOL) suppository 650 mg (has no administration in time range)  aspirin chewable tablet 81 mg (has no administration in time range)  clopidogrel (PLAVIX) tablet 75 mg (has no administration in time range)  atorvastatin (LIPITOR) tablet 80 mg (has no administration in  time range)  iohexol (OMNIPAQUE) 350 MG/ML injection 50 mL (50 mLs Intravenous Contrast Given 07/20/20 2019)  aspirin chewable tablet 81 mg (81 mg Oral Given 07/21/20 0025)  clopidogrel (PLAVIX) tablet  300 mg (300 mg Oral Given 07/21/20 0025)  potassium chloride 10 mEq in 100 mL IVPB (0 mEq Intravenous Stopped 07/21/20 0252)   stroke: mapping our early stages of recovery book ( Does not apply Given 07/21/20 0207)    ED Course  I have reviewed the triage vital signs and the nursing notes.  Pertinent labs & imaging results that were available during my care of the patient were reviewed by me and considered in my medical decision making (see chart for details).  86 year old female presenting to the emergency department as a code stroke with left-sided weakness and deficits that began at 230 today.  Airway cleared on arrival by myself and patient taken to CT scanner with neurology.  No evidence of LVO and not a tPA candidate per neurology.  CT images and blood tests pending.    Covid negative UA without sign of infection K 3.3, Cr 0.92, Na 137, WBC 10.8, Hgb 13.3 DG chest with no acute process visualized per my interpretation ECG with sinus rhythm with no acute ischemic findings per my interpretation.  Tele with NSR  Meds aspirin and plavix given per neurology recommendation  I personally reviewed the patient's labs, ECG, and imaging.  I obtained additional history from her daughter at bedside.  I consulted with neurology as noted below.   Clinical Course as of Jul 22 847  Wed Jul 20, 2020  2153 I spoke to Dr Aroor from neurology who is concerned the patient may have a lateral infarct, and recommended MRI of the brain and admission for stroke w/u, including dual antiplatelet agents with aspirin and plavix.  Pt and daughter updated.  Will admit, MRI and meds ordered.   [MT]  2248 Signed out to hospitalist   [MT]    Clinical Course User Index [MT] Betty Brooks, Kermit Balo, MD    Final Clinical Impression(s) / ED Diagnoses Final diagnoses:  Acute left-sided weakness  Episode of change in speech  CVA (cerebral vascular accident) [facility])    Rx / DC Orders ED Discharge Orders    None        Katelen Luepke, Kermit Balo, MD 07/21/20 [id number]

## 2020-07-20 NOTE — ED Triage Notes (Signed)
Pt coming from Urgent Care with complaint of slurred speech and left sided weakness. LKN was 1430. No history of stroke and not on any blood thinners. Code stroke called at 66.

## 2020-07-20 NOTE — Consult Note (Addendum)
Requesting Physician: Dr. Renaye Rakers    Chief Complaint: left side weakness  History obtained from: Patient and Chart   Nicole [name] is a 86 y.o. female with past medical history significant for hypertension, hypothyroidism, hyperlipidemia presents to emergency department with sudden onset left-sided weakness that occurred at 2:30 PM.  As her symptoms did not resolve patient was taken to urgent care.  Upon assessment EMS was called and patient brought to the ED as a code stroke.  On arrival, patient alert and oriented and following all commands.  Patient has left facial droop and left arm and left leg weakness.  Stat CT head was obtained which showed no acute changes and CT angiogram of the head and neck negative for LVO.  Patient outside TPA window on arrival to [facility]    Date last known well: 9.15.21 Time last known well: 2:30 PM tPA Given: no NIHSS: 5 Baseline MRS 0  PMH as noted above   No family history on file.  No family history of strokes at young age Social History:  reports that she has never smoked. She has never used smokeless tobacco. She reports that she does not drink alcohol and does not use drugs.  Allergies: No Known Allergies  Medications:                                                                                                                        I reviewed home medications   ROS:                                                                                                                                     14 systems reviewed and negative except above   Examination:                                                                                                      General: Appears well-developed  Psych: Affect appropriate to situation Eyes: No scleral injection HENT: No OP  obstrucion Head: Normocephalic.  Cardiovascular: Normal rate and regular rhythm.  Respiratory: Effort normal and breath sounds normal to anterior ascultation GI: Soft.   No distension. There is no tenderness.  Skin: WDI    Neurological Examination Mental Status Alert, oriented, thought content appropriate.  Speech fluent without evidence of aphasia.  Mild dysarthria.  Able to follow 3 step commands without difficulty. Cranial Nerves: II: Visual fields grossly normal,  III,IV, VI: ptosis not present, extra-ocular motions intact bilaterally, pupils equal, round, reactive to light and accommodation V,VII: smile symmetric, facial light touch sensation normal bilaterally VIII: hearing normal bilaterally IX,X: uvula rises symmetrically XI: bilateral shoulder shrug XII: midline tongue extension Motor: Right : Upper extremity   5/5    Left:     Upper extremity   4/5  Lower extremity   5/5     Lower extremity   3/5 Tone and bulk:normal tone throughout; no atrophy noted Sensory: Pinprick and light touch intact throughout, bilaterally Deep Tendon Reflexes: 2+ and symmetric throughout Plantars: Right: downgoing   Left: downgoing Cerebellar: normal finger-to-nose on right side, not out of proportion to weakness on the left      Lab Results: Basic Metabolic Panel: Recent Labs  Lab 07/20/20 2001 07/20/20 2008  NA 137 139  K 3.3* 3.3*  CL 100 102  CO2 25  --   GLUCOSE 139* 136*  BUN 16 19  CREATININE 0.92 0.80  CALCIUM 10.0  --     CBC: Recent Labs  Lab 07/20/20 2001 07/20/20 2008  WBC 10.8*  --   NEUTROABS 6.4  --   HGB 13.3 14.3  HCT 42.0 42.0  MCV 96.3  --   PLT 252  --     Coagulation Studies: Recent Labs    07/20/20 2001  LABPROT 12.2  INR 0.9    Imaging: CT HEAD CODE STROKE WO CONTRAST  Result Date: 07/20/2020 CLINICAL DATA:  Code stroke.  Left-sided weakness and slurred speech EXAM: CT HEAD WITHOUT CONTRAST CT ANGIOGRAPHY OF THE HEAD AND NECK TECHNIQUE: Contiguous axial images were obtained from the base of the skull through the vertex without intravenous contrast. Multidetector CT imaging of the head and neck was performed  using the standard protocol during bolus administration of intravenous contrast. Multiplanar CT image reconstructions and MIPs were obtained to evaluate the vascular anatomy. Carotid stenosis measurements (when applicable) are obtained utilizing NASCET criteria, using the distal internal carotid diameter as the denominator. CONTRAST:  50mL OMNIPAQUE IOHEXOL 350 MG/ML SOLN COMPARISON:  None. FINDINGS: CT HEAD FINDINGS Brain: There is no mass, hemorrhage or extra-axial collection. The size and configuration of the ventricles and extra-axial CSF spaces are normal. There is hypoattenuation of the periventricular white matter, most commonly indicating chronic ischemic microangiopathy. Vascular: No abnormal hyperdensity of the major intracranial arteries or dural venous sinuses. No intracranial atherosclerosis. Skull: The visualized skull base, calvarium and extracranial soft tissues are normal. Sinuses/Orbits: No fluid levels or advanced mucosal thickening of the visualized paranasal sinuses. No mastoid or middle ear effusion. The orbits are normal. ASPECTS (Alberta Stroke Program Early CT Score) - Ganglionic level infarction (caudate, lentiform nuclei, internal capsule, insula, M1-M3 cortex): 7 - Supraganglionic infarction (M4-M6 cortex): 3 Total score (0-10 with 10 being normal): 10 CTA NECK FINDINGS SKELETON: There is no bony spinal canal stenosis. No lytic or blastic lesion. OTHER NECK: Normal pharynx, larynx and major salivary glands. No cervical lymphadenopathy. Unremarkable thyroid gland. UPPER CHEST: No pneumothorax or pleural effusion. No nodules or masses. AORTIC ARCH:  There is calcific atherosclerosis of the aortic arch. There is no aneurysm, dissection or hemodynamically significant stenosis of the visualized portion of the aorta. Conventional 3 vessel aortic branching pattern. The visualized proximal subclavian arteries are widely patent. RIGHT CAROTID SYSTEM: No dissection, occlusion or aneurysm. Mild  atherosclerotic calcification at the carotid bifurcation without hemodynamically significant stenosis. LEFT CAROTID SYSTEM: No dissection, occlusion or aneurysm. Mild atherosclerotic calcification at the carotid bifurcation without hemodynamically significant stenosis. VERTEBRAL ARTERIES: Left dominant configuration. Both origins are clearly patent. There is no dissection, occlusion or flow-limiting stenosis to the skull base (V1-V3 segments). CTA HEAD FINDINGS POSTERIOR CIRCULATION: --Vertebral arteries: Mild atherosclerotic calcification of the left V4 segment. Otherwise normal. --Inferior cerebellar arteries: Normal. --Basilar artery: Normal. --Superior cerebellar arteries: Normal. --Posterior cerebral arteries (PCA): Normal. ANTERIOR CIRCULATION: --Intracranial internal carotid arteries: Normal. --Anterior cerebral arteries (ACA): Normal. Both A1 segments are present. Patent anterior communicating artery (a-comm). --Middle cerebral arteries (MCA): Normal. VENOUS SINUSES: As permitted by contrast timing, patent. ANATOMIC VARIANTS: Hypoplastic right ACA A1 segment, a common variant. Review of the MIP images confirms the above findings. IMPRESSION: 1. No emergent large vessel occlusion or high-grade stenosis of the head or neck. Aortic Atherosclerosis (ICD10-I70.0). Electronically Signed   By: Deatra Robinson M.D.   On: 07/20/2020 20:34   CT ANGIO HEAD CODE STROKE  Result Date: 07/20/2020 CLINICAL DATA:  Code stroke.  Left-sided weakness and slurred speech EXAM: CT HEAD WITHOUT CONTRAST CT ANGIOGRAPHY OF THE HEAD AND NECK TECHNIQUE: Contiguous axial images were obtained from the base of the skull through the vertex without intravenous contrast. Multidetector CT imaging of the head and neck was performed using the standard protocol during bolus administration of intravenous contrast. Multiplanar CT image reconstructions and MIPs were obtained to evaluate the vascular anatomy. Carotid stenosis measurements (when  applicable) are obtained utilizing NASCET criteria, using the distal internal carotid diameter as the denominator. CONTRAST:  51mL OMNIPAQUE IOHEXOL 350 MG/ML SOLN COMPARISON:  None. FINDINGS: CT HEAD FINDINGS Brain: There is no mass, hemorrhage or extra-axial collection. The size and configuration of the ventricles and extra-axial CSF spaces are normal. There is hypoattenuation of the periventricular white matter, most commonly indicating chronic ischemic microangiopathy. Vascular: No abnormal hyperdensity of the major intracranial arteries or dural venous sinuses. No intracranial atherosclerosis. Skull: The visualized skull base, calvarium and extracranial soft tissues are normal. Sinuses/Orbits: No fluid levels or advanced mucosal thickening of the visualized paranasal sinuses. No mastoid or middle ear effusion. The orbits are normal. ASPECTS (Alberta Stroke Program Early CT Score) - Ganglionic level infarction (caudate, lentiform nuclei, internal capsule, insula, M1-M3 cortex): 7 - Supraganglionic infarction (M4-M6 cortex): 3 Total score (0-10 with 10 being normal): 10 CTA NECK FINDINGS SKELETON: There is no bony spinal canal stenosis. No lytic or blastic lesion. OTHER NECK: Normal pharynx, larynx and major salivary glands. No cervical lymphadenopathy. Unremarkable thyroid gland. UPPER CHEST: No pneumothorax or pleural effusion. No nodules or masses. AORTIC ARCH: There is calcific atherosclerosis of the aortic arch. There is no aneurysm, dissection or hemodynamically significant stenosis of the visualized portion of the aorta. Conventional 3 vessel aortic branching pattern. The visualized proximal subclavian arteries are widely patent. RIGHT CAROTID SYSTEM: No dissection, occlusion or aneurysm. Mild atherosclerotic calcification at the carotid bifurcation without hemodynamically significant stenosis. LEFT CAROTID SYSTEM: No dissection, occlusion or aneurysm. Mild atherosclerotic calcification at the carotid  bifurcation without hemodynamically significant stenosis. VERTEBRAL ARTERIES: Left dominant configuration. Both origins are clearly patent. There is no dissection, occlusion or flow-limiting stenosis  to the skull base (V1-V3 segments). CTA HEAD FINDINGS POSTERIOR CIRCULATION: --Vertebral arteries: Mild atherosclerotic calcification of the left V4 segment. Otherwise normal. --Inferior cerebellar arteries: Normal. --Basilar artery: Normal. --Superior cerebellar arteries: Normal. --Posterior cerebral arteries (PCA): Normal. ANTERIOR CIRCULATION: --Intracranial internal carotid arteries: Normal. --Anterior cerebral arteries (ACA): Normal. Both A1 segments are present. Patent anterior communicating artery (a-comm). --Middle cerebral arteries (MCA): Normal. VENOUS SINUSES: As permitted by contrast timing, patent. ANATOMIC VARIANTS: Hypoplastic right ACA A1 segment, a common variant. Review of the MIP images confirms the above findings. IMPRESSION: 1. No emergent large vessel occlusion or high-grade stenosis of the head or neck. Aortic Atherosclerosis (ICD10-I70.0). Electronically Signed   By: Deatra RobinsonKevin  Herman M.D.   On: 07/20/2020 20:34   CT ANGIO NECK CODE STROKE  Result Date: 07/20/2020 CLINICAL DATA:  Code stroke.  Left-sided weakness and slurred speech EXAM: CT HEAD WITHOUT CONTRAST CT ANGIOGRAPHY OF THE HEAD AND NECK TECHNIQUE: Contiguous axial images were obtained from the base of the skull through the vertex without intravenous contrast. Multidetector CT imaging of the head and neck was performed using the standard protocol during bolus administration of intravenous contrast. Multiplanar CT image reconstructions and MIPs were obtained to evaluate the vascular anatomy. Carotid stenosis measurements (when applicable) are obtained utilizing NASCET criteria, using the distal internal carotid diameter as the denominator. CONTRAST:  50mL OMNIPAQUE IOHEXOL 350 MG/ML SOLN COMPARISON:  None. FINDINGS: CT HEAD FINDINGS  Brain: There is no mass, hemorrhage or extra-axial collection. The size and configuration of the ventricles and extra-axial CSF spaces are normal. There is hypoattenuation of the periventricular white matter, most commonly indicating chronic ischemic microangiopathy. Vascular: No abnormal hyperdensity of the major intracranial arteries or dural venous sinuses. No intracranial atherosclerosis. Skull: The visualized skull base, calvarium and extracranial soft tissues are normal. Sinuses/Orbits: No fluid levels or advanced mucosal thickening of the visualized paranasal sinuses. No mastoid or middle ear effusion. The orbits are normal. ASPECTS (Alberta Stroke Program Early CT Score) - Ganglionic level infarction (caudate, lentiform nuclei, internal capsule, insula, M1-M3 cortex): 7 - Supraganglionic infarction (M4-M6 cortex): 3 Total score (0-10 with 10 being normal): 10 CTA NECK FINDINGS SKELETON: There is no bony spinal canal stenosis. No lytic or blastic lesion. OTHER NECK: Normal pharynx, larynx and major salivary glands. No cervical lymphadenopathy. Unremarkable thyroid gland. UPPER CHEST: No pneumothorax or pleural effusion. No nodules or masses. AORTIC ARCH: There is calcific atherosclerosis of the aortic arch. There is no aneurysm, dissection or hemodynamically significant stenosis of the visualized portion of the aorta. Conventional 3 vessel aortic branching pattern. The visualized proximal subclavian arteries are widely patent. RIGHT CAROTID SYSTEM: No dissection, occlusion or aneurysm. Mild atherosclerotic calcification at the carotid bifurcation without hemodynamically significant stenosis. LEFT CAROTID SYSTEM: No dissection, occlusion or aneurysm. Mild atherosclerotic calcification at the carotid bifurcation without hemodynamically significant stenosis. VERTEBRAL ARTERIES: Left dominant configuration. Both origins are clearly patent. There is no dissection, occlusion or flow-limiting stenosis to the skull  base (V1-V3 segments). CTA HEAD FINDINGS POSTERIOR CIRCULATION: --Vertebral arteries: Mild atherosclerotic calcification of the left V4 segment. Otherwise normal. --Inferior cerebellar arteries: Normal. --Basilar artery: Normal. --Superior cerebellar arteries: Normal. --Posterior cerebral arteries (PCA): Normal. ANTERIOR CIRCULATION: --Intracranial internal carotid arteries: Normal. --Anterior cerebral arteries (ACA): Normal. Both A1 segments are present. Patent anterior communicating artery (a-comm). --Middle cerebral arteries (MCA): Normal. VENOUS SINUSES: As permitted by contrast timing, patent. ANATOMIC VARIANTS: Hypoplastic right ACA A1 segment, a common variant. Review of the MIP images confirms the above findings. IMPRESSION: 1.  No emergent large vessel occlusion or high-grade stenosis of the head or neck. Aortic Atherosclerosis (ICD10-I70.0). Electronically Signed   By: Deatra Robinson M.D.   On: 07/20/2020 20:34     ASSESSMENT AND PLAN  86 y.o. female with past medical history significant for hypertension, hypothyroidism, hyperlipidemia with acute left-sided weakness.  Likely acute stroke 2.2 small vessel disease- workup pending includes Echo, MRI brain.   Acute Ischemic Stroke   Risk factors : HTN, HLD Etiology: likely small vessel disease   Recommend # MRI of the brain without contrast #Transthoracic Echo  # Start patient on ASA 81mg  and plavix 75mg  daily x 3 weeks (after 300mg  Plavix load)  #Start or continue Atorvastatin 80 mg/other high intensity statin # BP goal: permissive HTN upto 220/120 mmHg ( 185/110 if patient has CHF, CKD) # HBAIC and Lipid profile # Telemetry monitoring # Frequent neuro checks #stroke swallow screen  Please page stroke NP  Or  PA  Or MD from 8am -4 pm  as this patient from this time will be  followed by the stroke.   You can look them up on [email or web address]  Password [facility]   [place] Stevens [facility] Pager Number 

## 2020-07-20 NOTE — ED Notes (Signed)
Called [facility] and spoke to [place] and notified them of CVA symptoms, CBG of 140, vital signs.

## 2020-07-20 NOTE — H&P (Signed)
[name]:[id number] DOB: [birth year] DOA: 07/20/2020    PCP: Lurena Nida, MD   Outpatient Specialists:   NONE   Patient arrived to ER on 07/20/20 at 1959 Referred by Attending Trifan, Kermit Balo, MD   Patient coming from: home Lives   With family    Chief Complaint:   Chief Complaint  Patient presents with  . Code Stroke    HPI: Nicole [name] is a 86 y.o. female with medical history significant of hypertension hypothyroidism and arthritis, HLD    Presented with left-sided weakness and speech changes started 2:30 PM patient presented to urgent care around 7:35 PM initial CBG 140 she was sent emergently to [facility], ER given stroke symptoms Code stroke was activated at 1946 At baseline pt is healthy only takes baby aspirin and lives independently No prior history of CVA Infectious risk factors:  Reports none  Has   been vaccinated against COVID X2   Initial COVID TEST   in house  PCR testing  Pending  No results found for: SARSCOV2NAA   Regarding pertinent Chronic problems:     Hyperlipidemia -  on statins on pravachol    HTN on Norvasc hydrochlorothiazide   Hypothyroidism:  on synthroid   Asthma -well   controlled on home inhalers/ nebs       While in ER: CT head unremarkable    ER Provider Called:     Dr. Wilford Corner  They Recommend admit to medicine  Seen  in ER  Hospitalist was called for admission for CVA  The following Work up has been ordered so far:  Orders Placed This Encounter  Procedures  . Critical Care  . SARS Coronavirus 2 by RT PCR (hospital order, performed in [facility] hospital lab) Nasopharyngeal Nasopharyngeal Swab  . CT HEAD CODE STROKE WO CONTRAST  . CT ANGIO HEAD CODE STROKE  . CT ANGIO NECK CODE STROKE  . MR BRAIN WO CONTRAST  . Ethanol  . Protime-INR  . APTT  . CBC  . Differential  . Comprehensive metabolic panel  . Urine rapid drug screen (hosp performed)  . Urinalysis, Routine w reflex microscopic  . Diet NPO  time specified  . Vital signs  . Cardiac Monitoring  . Modified Stroke Scale (mNIHSS) Document mNIHSS assessment every 2 hours for a total of 12 hours  . Stroke swallow screen  . Initiate Carrier Fluid Protocol  . If O2 sat If O2 Sat < 94%, administer O2 at 2 liters/minute via nasal cannula.  [place] to Activate Code Stroke Call [facility] [phone]  . Consult to hospitalist  ALL PATIENTS BEING ADMITTED/HAVING PROCEDURES NEED COVID-19 SCREENING  . Pulse oximetry, continuous  . I-stat chem 8, ED  . ED EKG  . EKG 12-Lead  . Saline lock IV     Following Medications were ordered in ER: Medications  aspirin chewable tablet 81 mg (has no administration in time range)  clopidogrel (PLAVIX) tablet 300 mg (has no administration in time range)  iohexol (OMNIPAQUE) 350 MG/ML injection 50 mL (50 mLs Intravenous Contrast Given 07/20/20 2019)        Consult Orders  (From admission, onward)         Start     Ordered   07/20/20 2155  Consult to hospitalist  ALL PATIENTS BEING ADMITTED/HAVING PROCEDURES NEED COVID-19 SCREENING  Once       Comments: ALL PATIENTS BEING ADMITTED/HAVING PROCEDURES NEED COVID-19 SCREENING  Provider:  (Not yet assigned)  Question  Answer Comment  Place call to: [facility] - pt follows with [facility]   Reason for Consult Admit      07/20/20 2154           Significant initial  Findings: Abnormal Labs Reviewed  CBC - Abnormal; Notable for the following components:      Result Value   WBC 10.8 (*)    All other components within normal limits  COMPREHENSIVE METABOLIC PANEL - Abnormal; Notable for the following components:   Potassium 3.3 (*)    Glucose, Bld 139 (*)    Total Protein 8.3 (*)    GFR calc non Af Amer 56 (*)    All other components within normal limits  URINALYSIS, ROUTINE W REFLEX MICROSCOPIC - Abnormal; Notable for the following components:   Color, Urine STRAW (*)    All other components within normal limits   I-STAT CHEM 8, ED - Abnormal; Notable for the following components:   Potassium 3.3 (*)    Glucose, Bld 136 (*)    Calcium, Ion 1.12 (*)    All other components within normal limits     Otherwise labs showing:    Recent Labs  Lab 07/20/20 2001 07/20/20 2008  NA 137 139  K 3.3* 3.3*  CO2 25  --   GLUCOSE 139* 136*  BUN 16 19  CREATININE 0.92 0.80  CALCIUM 10.0  --     Cr   stable,    Lab Results  Component Value Date   CREATININE 0.80 07/20/2020   CREATININE 0.92 07/20/2020    Recent Labs  Lab 07/20/20 2001  AST 30  ALT 23  ALKPHOS 48  BILITOT 1.1  PROT 8.3*  ALBUMIN 4.5   Lab Results  Component Value Date   CALCIUM 10.0 07/20/2020      WBC      Component Value Date/Time   WBC 10.8 (H) 07/20/2020 2001   LYMPHSABS 3.5 07/20/2020 2001   MONOABS 0.6 07/20/2020 2001   EOSABS 0.2 07/20/2020 2001   BASOSABS 0.1 07/20/2020 2001    Plt: Lab Results  Component Value Date   PLT 252 07/20/2020      COVID-19 Labs  HG/HCT stable,      Component Value Date/Time   HGB 14.3 07/20/2020 2008   HCT 42.0 07/20/2020 2008   MCV 96.3 07/20/2020 2001    No results for input(s): LIPASE, AMYLASE in the last 168 hours. No results for input(s): AMMONIA in the last 168 hours.     ECG: Ordered Personally reviewed by me showing: HR : 97 Rhythm:  NSR    no evidence of ischemic changes QTC 498     CBG (last 3)  Recent Labs    07/20/20 1937  GLUCAP 140*      UA    no evidence of UTI    Urine analysis:    Component Value Date/Time   COLORURINE STRAW (A) 07/20/2020 2037   APPEARANCEUR CLEAR 07/20/2020 2037   LABSPEC 1.013 07/20/2020 2037   PHURINE 7.0 07/20/2020 2037   GLUCOSEU NEGATIVE 07/20/2020 2037   HGBUR NEGATIVE 07/20/2020 2037   BILIRUBINUR NEGATIVE 07/20/2020 2037   KETONESUR NEGATIVE 07/20/2020 2037   PROTEINUR NEGATIVE 07/20/2020 2037   NITRITE NEGATIVE 07/20/2020 2037   LEUKOCYTESUR NEGATIVE 07/20/2020 2037     CT HEAD   NON acute  CXR  - Ordered   MRI -   Positive for acute small vessel infarct in the posterior right corona radiata and lentiform.  No associated hemorrhage or mass effect.     ED Triage Vitals [07/20/20 2030]  Enc Vitals Group     BP (!) 189/95     Pulse Rate (!) 104     Resp (!) 28     Temp 97.9 F (36.6 C)     Temp Source Oral     SpO2 100 %     Weight      Height      Head Circumference      Peak Flow      Pain Score      Pain Loc      Pain Edu?      Excl. in GC?   UEAV(40)@       Latest  Blood pressure (!) 160/86, pulse 90, temperature 97.9 F (36.6 C), temperature source Oral, resp. rate 15, SpO2 97 %.    Review of Systems:    Pertinent positives include: left side weakness slurred speech,  Constitutional:  No weight loss, night sweats, Fevers, chills, fatigue, weight loss  HEENT:  No headaches, Difficulty swallowing,Tooth/dental problems,Sore throat,  No sneezing, itching, ear ache, nasal congestion, post nasal drip,  Cardio-vascular:  No chest pain, Orthopnea, PND, anasarca, dizziness, palpitations.no Bilateral lower extremity swelling  GI:  No heartburn, indigestion, abdominal pain, nausea, vomiting, diarrhea, change in bowel habits, loss of appetite, melena, blood in stool, hematemesis Resp:  no shortness of breath at rest. No dyspnea on exertion, No excess mucus, no productive cough, No non-productive cough, No coughing up of blood.No change in color of mucus.No wheezing. Skin:  no rash or lesions. No jaundice GU:  no dysuria, change in color of urine, no urgency or frequency. No straining to urinate.  No flank pain.  Musculoskeletal:  No joint pain or no joint swelling. No decreased range of motion. No back pain.  Psych:  No change in mood or affect. No depression or anxiety. No memory loss.  Neuro:  , no tingling, no weakness, no double vision, no gait abnormality,   no confusion  All systems reviewed and apart from HOPI all are negative  Past Medical History:  No  past medical history on file.    No past surgical history on file.  Social History:  Ambulatory independently      reports that she has never smoked. She has never used smokeless tobacco. She reports that she does not drink alcohol and does not use drugs.     Family History:   Family History  Problem Relation Age of Onset  . Diabetes Mother   . Diabetes Other   . Hypertension Other     Allergies: No Known Allergies   Prior to Admission medications   Medication Sig Start Date End Date Taking? Authorizing Provider  amLODipine (NORVASC) 10 MG tablet Take 10 mg by mouth daily. 06/08/15  Yes [provider]  aspirin 81 MG EC tablet Take 81 mg by mouth daily.  08/28/07  Yes [provider]  cetirizine (ZYRTEC) 10 MG tablet Take 10 mg by mouth daily as needed for allergies or rhinitis.   Yes [provider]  Cholecalciferol (VITAMIN D-3 PO) Take 1 capsule by mouth daily.   Yes [provider]  Cyanocobalamin (VITAMIN B-12 PO) Take 1 tablet by mouth daily.   Yes [provider]  diclofenac Sodium (VOLTAREN) 1 % GEL Apply 2 g topically 4 (four) times daily as needed (to affected areas- for pain).    Yes [provider]  hydrochlorothiazide (HYDRODIURIL)  25 MG tablet Take 25 mg by mouth daily. 04/21/15  Yes [provider]  levothyroxine (SYNTHROID) 50 MCG tablet Take 50 mcg by mouth daily before breakfast.  07/11/20  Yes [provider]  Multiple Vitamins-Minerals (CENTRUM SILVER ULTRA WOMENS) TABS Take 1 tablet by mouth daily with breakfast.   Yes [provider]  oxybutynin (DITROPAN-XL) 5 MG 24 hr tablet Take 5 mg by mouth at bedtime.  07/13/20  Yes [provider]  pravastatin (PRAVACHOL) 40 MG tablet Take 40 mg by mouth daily.  07/02/15  Yes [provider]  PROAIR HFA 108 (90 BASE) MCG/ACT inhaler Inhale 1 puff into the lungs every 4 (four) hours as needed for wheezing or shortness of breath.   04/18/15  Yes [provider]  cefdinir (OMNICEF) 300 MG capsule Take 300 mg by mouth 2 (two) times daily. Patient not taking: Reported on 07/20/2020 04/18/15   [provider]  celecoxib (CELEBREX) 200 MG capsule  06/16/13   [provider]  celecoxib (CELEBREX) 200 MG capsule Take 200 mg by mouth daily. Patient not taking: Reported on 07/20/2020 07/10/20   [provider]  Diclofenac Sodium CR 100 MG 24 hr tablet  06/08/15   [provider]  ibandronate (BONIVA) 150 MG tablet Take by mouth. Patient not taking: Reported on 07/20/2020    [provider]  levothyroxine (SYNTHROID, LEVOTHROID) 25 MCG tablet Take 25 mcg by mouth daily. Patient not taking: Reported on 07/20/2020 06/28/15   [provider]   Physical Exam: Vitals with BMI 07/20/2020 07/20/2020 07/20/2020  Height - - -  Weight - - -  BMI - - -  Systolic 160 189 267  Diastolic 86 95 80  Pulse 90 104 98     1. General:  in No  Acute distress    Chronically ill  -appearing 2. Psychological: Alert and   Oriented 3. Head/ENT:    Dry Mucous Membranes                          Head Non traumatic, neck supple                         Poor Dentition 4. SKIN:   decreased Skin turgor,  Skin clean Dry and intact no rash 5. Heart: Regular rate and rhythm no  Murmur, no Rub or gallop 6. Lungs  no wheezes or crackles   7. Abdomen: Soft,   non-tender, Non distended  bowel sounds present 8. Lower extremities: no clubbing, cyanosis, no   edema 9. Neurologically diminished strength on the left 10. MSK: Normal range of motion   All other LABS:     Recent Labs  Lab 07/20/20 2001 07/20/20 2008  WBC 10.8*  --   NEUTROABS 6.4  --   HGB 13.3 14.3  HCT 42.0 42.0  MCV 96.3  --   PLT 252  --      Recent Labs  Lab 07/20/20 2001 07/20/20 2008  NA 137 139  K 3.3* 3.3*  CL 100 102  CO2 25  --   GLUCOSE 139* 136*  BUN 16 19  CREATININE 0.92 0.80  CALCIUM 10.0  --      Recent  Labs  Lab 07/20/20 2001  AST 30  ALT 23  ALKPHOS 48  BILITOT 1.1  PROT 8.3*  ALBUMIN 4.5       Cultures: No results found for: SDES, SPECREQUEST,  CULT, REPTSTATUS   Radiological Exams on Admission: MR BRAIN WO CONTRAST  Result Date: 07/20/2020 CLINICAL DATA:  86 year old female code stroke presentation, left side weakness. EXAM: MRI HEAD WITHOUT CONTRAST TECHNIQUE: Multiplanar, multiecho pulse sequences of the brain and surrounding structures were obtained without intravenous contrast. COMPARISON:  CT head, CTA head and neck earlier today. FINDINGS: Brain: Discontinuous curvilinear restricted diffusion is noted from the posterior right corona radiata tracking into the posterior right lentiform, best seen on series 7, image 53). Mild if any associated T2 and FLAIR hyperintensity. No hemorrhage or mass effect. No other restricted diffusion. Patchy and scattered bilateral cerebral white matter T2 and FLAIR hyperintensity elsewhere. No cortical encephalomalacia. No chronic cerebral blood products. Moderate T2 heterogeneity in the bilateral deep gray nuclei, most resembling lacunar infarcts in the basal ganglia. Brainstem and cerebellum are within normal limits for age. No midline shift, mass effect, evidence of mass lesion, ventriculomegaly, extra-axial collection or acute intracranial hemorrhage. Cervicomedullary junction and pituitary are within normal limits. Vascular: Major intracranial vascular flow voids are preserved. Skull and upper cervical spine: Partially visible multilevel cervical spine degeneration. Mild degenerative spinal stenosis suspected at C4-C5. Normal bone marrow signal. Sinuses/Orbits: Postoperative changes to both globes, otherwise negative. Other: Visible internal auditory structures appear normal. Visible scalp and face appear negative. IMPRESSION: 1. Positive for acute small vessel infarct in the posterior right corona radiata and lentiform. No associated hemorrhage or mass  effect. 2. Underlying chronic small vessel disease, including in the bilateral basal ganglia. 3. Mild degenerative spinal stenosis suspected at C4-C5. Electronically Signed   By: Odessa Fleming M.D.   On: 07/20/2020 22:47   CT HEAD CODE STROKE WO CONTRAST  Result Date: 07/20/2020 CLINICAL DATA:  Code stroke.  Left-sided weakness and slurred speech EXAM: CT HEAD WITHOUT CONTRAST CT ANGIOGRAPHY OF THE HEAD AND NECK TECHNIQUE: Contiguous axial images were obtained from the base of the skull through the vertex without intravenous contrast. Multidetector CT imaging of the head and neck was performed using the standard protocol during bolus administration of intravenous contrast. Multiplanar CT image reconstructions and MIPs were obtained to evaluate the vascular anatomy. Carotid stenosis measurements (when applicable) are obtained utilizing NASCET criteria, using the distal internal carotid diameter as the denominator. CONTRAST:  79mL OMNIPAQUE IOHEXOL 350 MG/ML SOLN COMPARISON:  None. FINDINGS: CT HEAD FINDINGS Brain: There is no mass, hemorrhage or extra-axial collection. The size and configuration of the ventricles and extra-axial CSF spaces are normal. There is hypoattenuation of the periventricular white matter, most commonly indicating chronic ischemic microangiopathy. Vascular: No abnormal hyperdensity of the major intracranial arteries or dural venous sinuses. No intracranial atherosclerosis. Skull: The visualized skull base, calvarium and extracranial soft tissues are normal. Sinuses/Orbits: No fluid levels or advanced mucosal thickening of the visualized paranasal sinuses. No mastoid or middle ear effusion. The orbits are normal. ASPECTS (Alberta Stroke Program Early CT Score) - Ganglionic level infarction (caudate, lentiform nuclei, internal capsule, insula, M1-M3 cortex): 7 - Supraganglionic infarction (M4-M6 cortex): 3 Total score (0-10 with 10 being normal): 10 CTA NECK FINDINGS SKELETON: There is no bony  spinal canal stenosis. No lytic or blastic lesion. OTHER NECK: Normal pharynx, larynx and major salivary glands. No cervical lymphadenopathy. Unremarkable thyroid gland. UPPER CHEST: No pneumothorax or pleural effusion. No nodules or masses. AORTIC ARCH: There is calcific atherosclerosis of the aortic arch. There is no aneurysm, dissection or hemodynamically significant stenosis of the visualized portion of the aorta. Conventional 3 vessel aortic branching pattern. The visualized proximal subclavian  arteries are widely patent. RIGHT CAROTID SYSTEM: No dissection, occlusion or aneurysm. Mild atherosclerotic calcification at the carotid bifurcation without hemodynamically significant stenosis. LEFT CAROTID SYSTEM: No dissection, occlusion or aneurysm. Mild atherosclerotic calcification at the carotid bifurcation without hemodynamically significant stenosis. VERTEBRAL ARTERIES: Left dominant configuration. Both origins are clearly patent. There is no dissection, occlusion or flow-limiting stenosis to the skull base (V1-V3 segments). CTA HEAD FINDINGS POSTERIOR CIRCULATION: --Vertebral arteries: Mild atherosclerotic calcification of the left V4 segment. Otherwise normal. --Inferior cerebellar arteries: Normal. --Basilar artery: Normal. --Superior cerebellar arteries: Normal. --Posterior cerebral arteries (PCA): Normal. ANTERIOR CIRCULATION: --Intracranial internal carotid arteries: Normal. --Anterior cerebral arteries (ACA): Normal. Both A1 segments are present. Patent anterior communicating artery (a-comm). --Middle cerebral arteries (MCA): Normal. VENOUS SINUSES: As permitted by contrast timing, patent. ANATOMIC VARIANTS: Hypoplastic right ACA A1 segment, a common variant. Review of the MIP images confirms the above findings. IMPRESSION: 1. No emergent large vessel occlusion or high-grade stenosis of the head or neck. Aortic Atherosclerosis (ICD10-I70.0). Electronically Signed   By: Deatra Robinson M.D.   On: 07/20/2020  20:34   CT ANGIO HEAD CODE STROKE  Result Date: 07/20/2020 CLINICAL DATA:  Code stroke.  Left-sided weakness and slurred speech EXAM: CT HEAD WITHOUT CONTRAST CT ANGIOGRAPHY OF THE HEAD AND NECK TECHNIQUE: Contiguous axial images were obtained from the base of the skull through the vertex without intravenous contrast. Multidetector CT imaging of the head and neck was performed using the standard protocol during bolus administration of intravenous contrast. Multiplanar CT image reconstructions and MIPs were obtained to evaluate the vascular anatomy. Carotid stenosis measurements (when applicable) are obtained utilizing NASCET criteria, using the distal internal carotid diameter as the denominator. CONTRAST:  50mL OMNIPAQUE IOHEXOL 350 MG/ML SOLN COMPARISON:  None. FINDINGS: CT HEAD FINDINGS Brain: There is no mass, hemorrhage or extra-axial collection. The size and configuration of the ventricles and extra-axial CSF spaces are normal. There is hypoattenuation of the periventricular white matter, most commonly indicating chronic ischemic microangiopathy. Vascular: No abnormal hyperdensity of the major intracranial arteries or dural venous sinuses. No intracranial atherosclerosis. Skull: The visualized skull base, calvarium and extracranial soft tissues are normal. Sinuses/Orbits: No fluid levels or advanced mucosal thickening of the visualized paranasal sinuses. No mastoid or middle ear effusion. The orbits are normal. ASPECTS (Alberta Stroke Program Early CT Score) - Ganglionic level infarction (caudate, lentiform nuclei, internal capsule, insula, M1-M3 cortex): 7 - Supraganglionic infarction (M4-M6 cortex): 3 Total score (0-10 with 10 being normal): 10 CTA NECK FINDINGS SKELETON: There is no bony spinal canal stenosis. No lytic or blastic lesion. OTHER NECK: Normal pharynx, larynx and major salivary glands. No cervical lymphadenopathy. Unremarkable thyroid gland. UPPER CHEST: No pneumothorax or pleural effusion.  No nodules or masses. AORTIC ARCH: There is calcific atherosclerosis of the aortic arch. There is no aneurysm, dissection or hemodynamically significant stenosis of the visualized portion of the aorta. Conventional 3 vessel aortic branching pattern. The visualized proximal subclavian arteries are widely patent. RIGHT CAROTID SYSTEM: No dissection, occlusion or aneurysm. Mild atherosclerotic calcification at the carotid bifurcation without hemodynamically significant stenosis. LEFT CAROTID SYSTEM: No dissection, occlusion or aneurysm. Mild atherosclerotic calcification at the carotid bifurcation without hemodynamically significant stenosis. VERTEBRAL ARTERIES: Left dominant configuration. Both origins are clearly patent. There is no dissection, occlusion or flow-limiting stenosis to the skull base (V1-V3 segments). CTA HEAD FINDINGS POSTERIOR CIRCULATION: --Vertebral arteries: Mild atherosclerotic calcification of the left V4 segment. Otherwise normal. --Inferior cerebellar arteries: Normal. --Basilar artery: Normal. --Superior cerebellar arteries: Normal. --Posterior  cerebral arteries (PCA): Normal. ANTERIOR CIRCULATION: --Intracranial internal carotid arteries: Normal. --Anterior cerebral arteries (ACA): Normal. Both A1 segments are present. Patent anterior communicating artery (a-comm). --Middle cerebral arteries (MCA): Normal. VENOUS SINUSES: As permitted by contrast timing, patent. ANATOMIC VARIANTS: Hypoplastic right ACA A1 segment, a common variant. Review of the MIP images confirms the above findings. IMPRESSION: 1. No emergent large vessel occlusion or high-grade stenosis of the head or neck. Aortic Atherosclerosis (ICD10-I70.0). Electronically Signed   By: Kevin  Herman MDeatra Robinson.D.   On: 07/20/2020 20:34   CT ANGIO NECK CODE STROKE  Result Date: 07/20/2020 CLINICAL DATA:  Code stroke.  Left-sided weakness and slurred speech EXAM: CT HEAD WITHOUT CONTRAST CT ANGIOGRAPHY OF THE HEAD AND NECK TECHNIQUE:  Contiguous axial images were obtained from the base of the skull through the vertex without intravenous contrast. Multidetector CT imaging of the head and neck was performed using the standard protocol during bolus administration of intravenous contrast. Multiplanar CT image reconstructions and MIPs were obtained to evaluate the vascular anatomy. Carotid stenosis measurements (when applicable) are obtained utilizing NASCET criteria, using the distal internal carotid diameter as the denominator. CONTRAST:  50mL OMNIPAQUE IOHEXOL 350 MG/ML SOLN COMPARISON:  None. FINDINGS: CT HEAD FINDINGS Brain: There is no mass, hemorrhage or extra-axial collection. The size and configuration of the ventricles and extra-axial CSF spaces are normal. There is hypoattenuation of the periventricular white matter, most commonly indicating chronic ischemic microangiopathy. Vascular: No abnormal hyperdensity of the major intracranial arteries or dural venous sinuses. No intracranial atherosclerosis. Skull: The visualized skull base, calvarium and extracranial soft tissues are normal. Sinuses/Orbits: No fluid levels or advanced mucosal thickening of the visualized paranasal sinuses. No mastoid or middle ear effusion. The orbits are normal. ASPECTS (Alberta Stroke Program Early CT Score) - Ganglionic level infarction (caudate, lentiform nuclei, internal capsule, insula, M1-M3 cortex): 7 - Supraganglionic infarction (M4-M6 cortex): 3 Total score (0-10 with 10 being normal): 10 CTA NECK FINDINGS SKELETON: There is no bony spinal canal stenosis. No lytic or blastic lesion. OTHER NECK: Normal pharynx, larynx and major salivary glands. No cervical lymphadenopathy. Unremarkable thyroid gland. UPPER CHEST: No pneumothorax or pleural effusion. No nodules or masses. AORTIC ARCH: There is calcific atherosclerosis of the aortic arch. There is no aneurysm, dissection or hemodynamically significant stenosis of the visualized portion of the aorta.  Conventional 3 vessel aortic branching pattern. The visualized proximal subclavian arteries are widely patent. RIGHT CAROTID SYSTEM: No dissection, occlusion or aneurysm. Mild atherosclerotic calcification at the carotid bifurcation without hemodynamically significant stenosis. LEFT CAROTID SYSTEM: No dissection, occlusion or aneurysm. Mild atherosclerotic calcification at the carotid bifurcation without hemodynamically significant stenosis. VERTEBRAL ARTERIES: Left dominant configuration. Both origins are clearly patent. There is no dissection, occlusion or flow-limiting stenosis to the skull base (V1-V3 segments). CTA HEAD FINDINGS POSTERIOR CIRCULATION: --Vertebral arteries: Mild atherosclerotic calcification of the left V4 segment. Otherwise normal. --Inferior cerebellar arteries: Normal. --Basilar artery: Normal. --Superior cerebellar arteries: Normal. --Posterior cerebral arteries (PCA): Normal. ANTERIOR CIRCULATION: --Intracranial internal carotid arteries: Normal. --Anterior cerebral arteries (ACA): Normal. Both A1 segments are present. Patent anterior communicating artery (a-comm). --Middle cerebral arteries (MCA): Normal. VENOUS SINUSES: As permitted by contrast timing, patent. ANATOMIC VARIANTS: Hypoplastic right ACA A1 segment, a common variant. Review of the MIP images confirms the above findings. IMPRESSION: 1. No emergent large vessel occlusion or high-grade stenosis of the head or neck. Aortic Atherosclerosis (ICD10-I70.0). Electronically Signed   By: Deatra RobinsonKevin  Herman M.D.   On: 07/20/2020 20:34    Chart has  been reviewed    Assessment/Plan  86 y.o. female with medical history significant of hypertension hypothyroidism and arthritis  Admitted for   CVA  Present on Admission:  . CVA (cerebral vascular accident) (HCC) -  - will admit based on TIA/CVA protocol        Monitor on Tele       MRA/MRI  Resulted - showing acute ischemic CVA        CTA unremarkable       Echo to evaluate for  possible embolic source,        obtain cardiac enzymes,  ECG,   Lipid panel, TSH.        Order PT/OT evaluation.        keep nothing by mouth until passes swallow eval        Will make sure patient is on antiplatelet ASA and Plavix agent and statin        Allow permissive Hypertension keep BP <220/120        Neurology consulted will see pt.  . Essential hypertension -allow permissive hypertension for now  . Hypothyroidism -chronic stable continue home medications check TSH  . Hypokalemia -replace and check magnesium level replace as needed  Glaucoma continue home medications  Other plan as per orders.  DVT prophylaxis:  SCD      Code Status:    Code Status: Not on file FULL CODE   as per patient      Family Communication:   Family  at  Bedside  plan of care was discussed with  Daughter   Disposition Plan:     To home once workup is complete and patient is stable   Following barriers for discharge:                            Electrolytes corrected                                                                                    Will need to be able to tolerate PO                            Will likely need home health, home O2, set up                           Will need consultants to evaluate patient prior to discharge                     Would benefit from PT/OT eval prior to DC  Ordered                   Swallow eval - SLP ordered                                        Consults called: Neurology    Admission status:  ED Disposition    ED Disposition Condition Comment   [facility]  Area: [facility] [[id number]]  Level of Care: Telemetry Medical [104]  May admit patient to [facility] or [facility] if equivalent level of care is available:: No  Covid Evaluation: Asymptomatic Screening Protocol (No Symptoms)  Diagnosis: CVA (cerebral vascular accident) [facility]) [[id number]]  Admitting Physician: Therisa Doyne [[id number]]  Attending Physician: Therisa Doyne [[id number]]  Estimated length of stay: past midnight tomorrow  Certification:: I certify this patient will need inpatient services for at least 2 midnights          inpatient     I Expect 2 midnight stay secondary to severity of patient's current illness need for inpatient interventions justified by the following:  Severe lab/radiological/exam abnormalities including:   Neurological deficits persistent .    That are currently affecting medical management.   I expect  patient to be hospitalized for 2 midnights requiring inpatient medical care.  Patient is at high risk for adverse outcome (such as loss of life or disability) if not treated.  Indication for inpatient stay as follows: Ongoing neurological deficits       Level of care    tele  For   indefinitely please discontinue once patient no longer qualifies COVID-19 Labs    No results found for: [id number]   Precautions: admitted as  asymptomatic screening protocol    PPE: Used by the provider:   P100  eye Goggles,  Gloves     Allora Bains 07/21/2020, 1:21 AM    [facility]     after 2 AM please page floor coverage PA If 7AM-7PM, please contact the day team taking care of the patient using [email or web address]   Patient was evaluated in the context of the global COVID-19 pandemic, which necessitated consideration that the patient might be at risk for infection with the SARS-CoV-2 virus that causes COVID-19. Institutional protocols and algorithms that pertain to the evaluation of patients at risk for COVID-19 are in a state of rapid change based on information released by regulatory bodies including the CDC and federal and state organizations. These policies and algorithms were followed during the patient's care.

## 2020-07-20 NOTE — ED Provider Notes (Signed)
  [facility]   [id number] 07/20/20 Arrival Time: 1959  ASSESSMENT & PLAN:  1. Acute left-sided weakness   2. Episode of change in speech     Pt wheeled to triage from waiting room by RN who called me to evaluate. History from patient's daughter who reports L-sided weakness and speech changes approx 2:30 this afternoon. Pt does demonstrate L sided weakness; question slightly slurred speech.  CBG WNL here. EMS called. To ED. Code stroke called. Stable upon d/c.    OBJECTIVE:  Vitals:   07/20/20 2030  BP: (!) 189/95  Pulse: (!) 104  Resp: (!) 28  Temp: 97.9 F (36.6 C)  TempSrc: Oral  SpO2: 100%       Mardella Layman, MD 07/20/20 2046

## 2020-07-20 NOTE — ED Notes (Signed)
Patient is being discharged from the Urgent Care and sent to the Emergency Department via [facility] . Per Dr Tracie Harrier, patient is in need of higher level of care due to stroke like symptoms. Patient is aware and verbalizes understanding of plan of care.  Vitals:   07/20/20 1936  BP: (!) 166/80  Pulse: 98  Resp: 17  Temp: 98.2 F (36.8 C)  SpO2: 97%

## 2020-07-20 NOTE — ED Triage Notes (Signed)
Pt presents with left side numbness, change in speech and left arm pain since 2:30 pm today.

## 2020-07-20 NOTE — ED Notes (Signed)
Called EMS for CVA

## 2020-07-21 ENCOUNTER — Other Ambulatory Visit (HOSPITAL_COMMUNITY): Payer: Medicare Other

## 2020-07-21 ENCOUNTER — Inpatient Hospital Stay (HOSPITAL_COMMUNITY): Payer: Medicare Other

## 2020-07-21 DIAGNOSIS — I6389 Other cerebral infarction: Secondary | ICD-10-CM

## 2020-07-21 DIAGNOSIS — I340 Nonrheumatic mitral (valve) insufficiency: Secondary | ICD-10-CM

## 2020-07-21 LAB — URINALYSIS, DIPSTICK ONLY
Bacteria, UA: NONE SEEN
Bilirubin Urine: NEGATIVE
Glucose, UA: NEGATIVE mg/dL
Hgb urine dipstick: NEGATIVE
Ketones, ur: NEGATIVE mg/dL
Leukocytes,Ua: NEGATIVE
Nitrite: NEGATIVE
Protein, ur: NEGATIVE mg/dL
Specific Gravity, Urine: 1.012 (ref 1.005–1.030)
pH: 8 (ref 5.0–8.0)

## 2020-07-21 LAB — ECHOCARDIOGRAM COMPLETE
Area-P 1/2: 2.83 cm2
S' Lateral: 2.52 cm

## 2020-07-21 LAB — LIPID PANEL
Cholesterol: 198 mg/dL (ref 0–200)
Cholesterol: 195 mg/dL (ref 0–200)
HDL: 52 mg/dL (ref >40)
HDL: 51 mg/dL (ref >40)
LDL Cholesterol: 125 mg/dL — ABNORMAL HIGH (ref 0–99)
LDL Cholesterol: 128 mg/dL — ABNORMAL HIGH (ref 0–99)
Total CHOL/HDL Ratio: 3.8 RATIO
Total CHOL/HDL Ratio: 3.8 RATIO
Triglycerides: 104 mg/dL (ref <150)
Triglycerides: 80 mg/dL (ref <150)
VLDL: 21 mg/dL (ref 0–40)
VLDL: 16 mg/dL (ref 0–40)

## 2020-07-21 LAB — HEMOGLOBIN A1C
Hgb A1c MFr Bld: 5.4 % (ref 4.8–5.6)
Mean Plasma Glucose: 108.28 mg/dL

## 2020-07-21 LAB — MAGNESIUM: Magnesium: 2.1 mg/dL (ref 1.7–2.4)

## 2020-07-21 LAB — VITAMIN B12: Vitamin B-12: 1114 pg/mL — ABNORMAL HIGH (ref 180–914)

## 2020-07-21 LAB — SARS CORONAVIRUS 2 BY RT PCR (HOSPITAL ORDER, PERFORMED IN ~~LOC~~ HOSPITAL LAB): SARS Coronavirus 2: NEGATIVE

## 2020-07-21 IMAGING — CR DG CHEST 2V
2 series · 2 of 2 positions shown · non-contrast
Comparison: None.

CLINICAL DATA: CVA symptoms

EXAM:
CHEST - 2 VIEW

[chest lat]
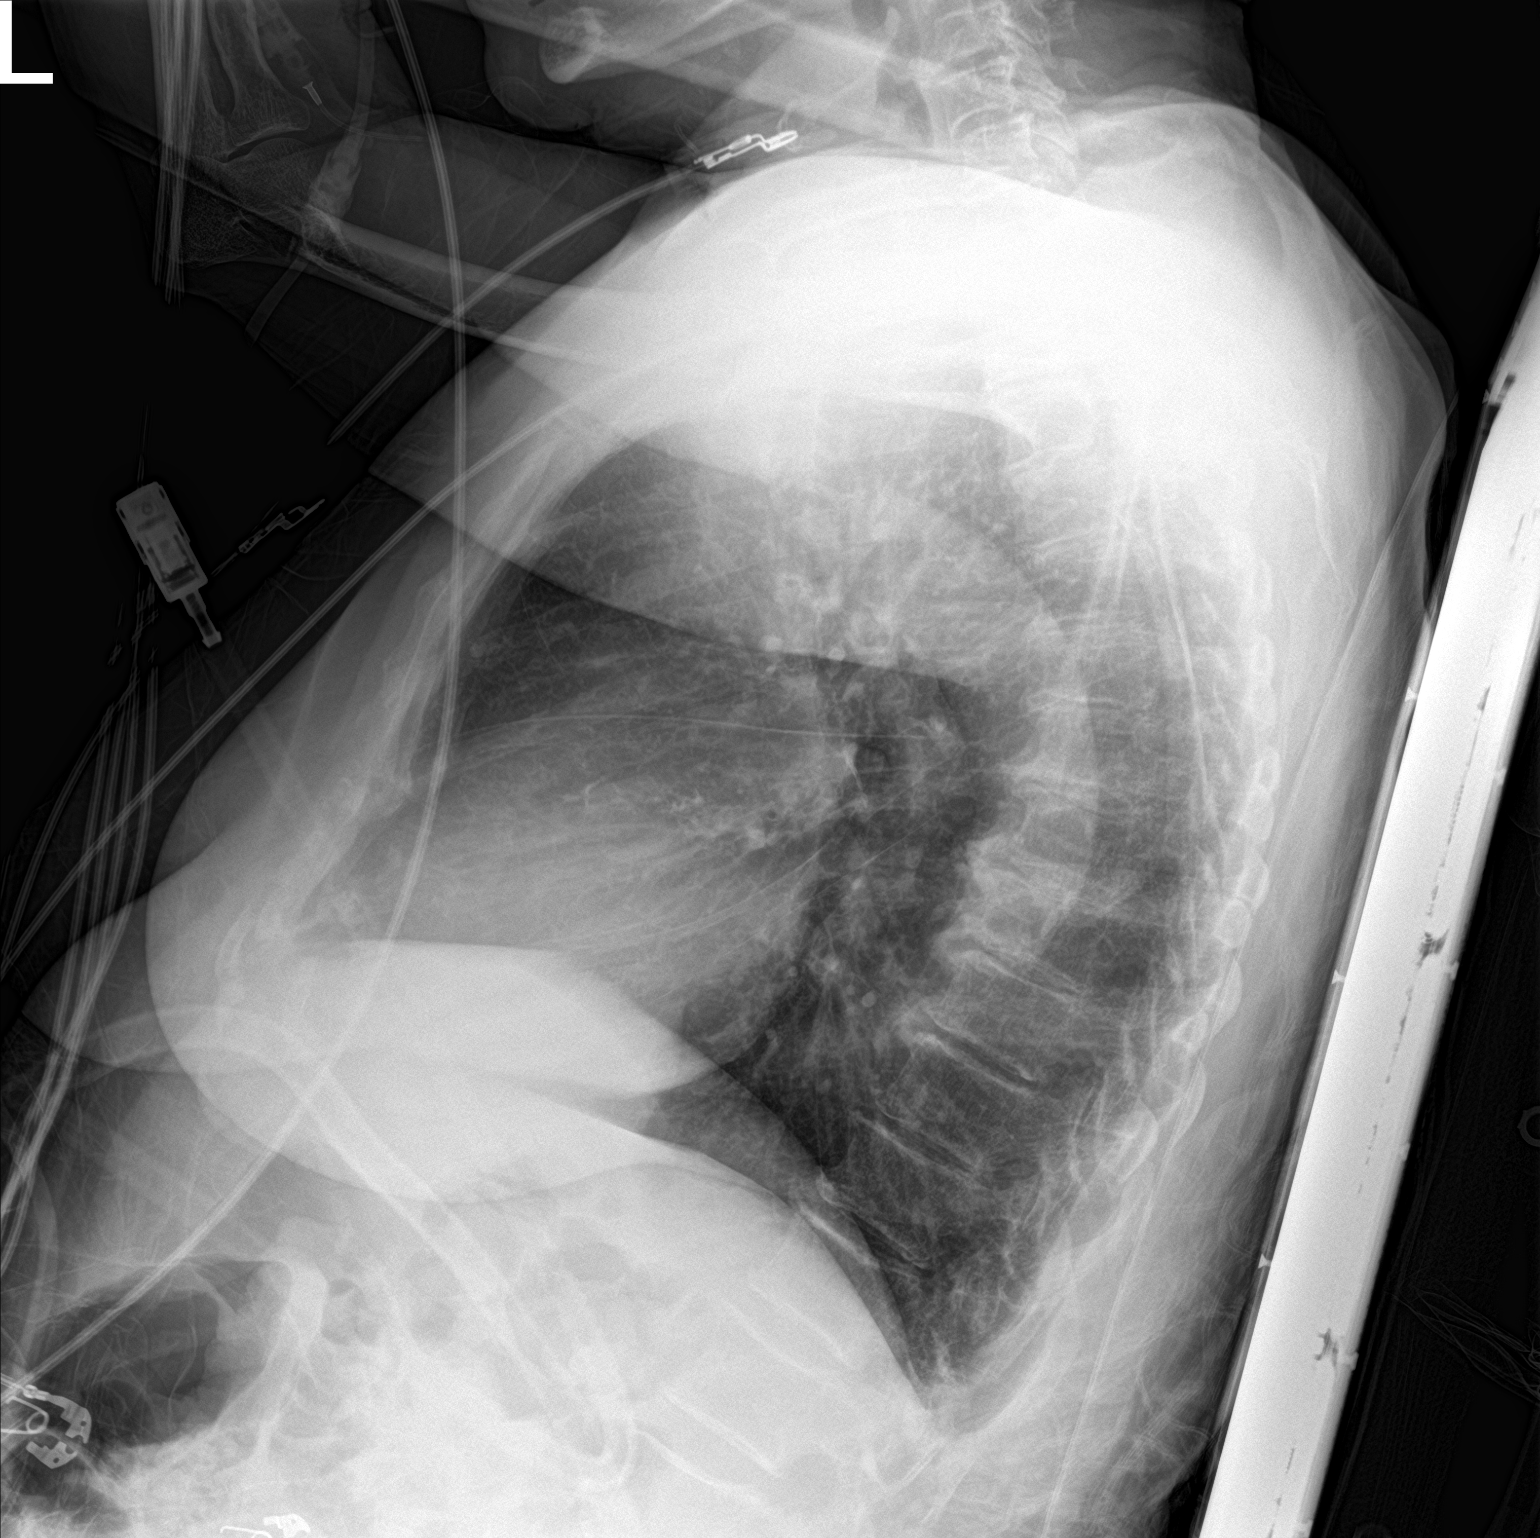

[chest ap]
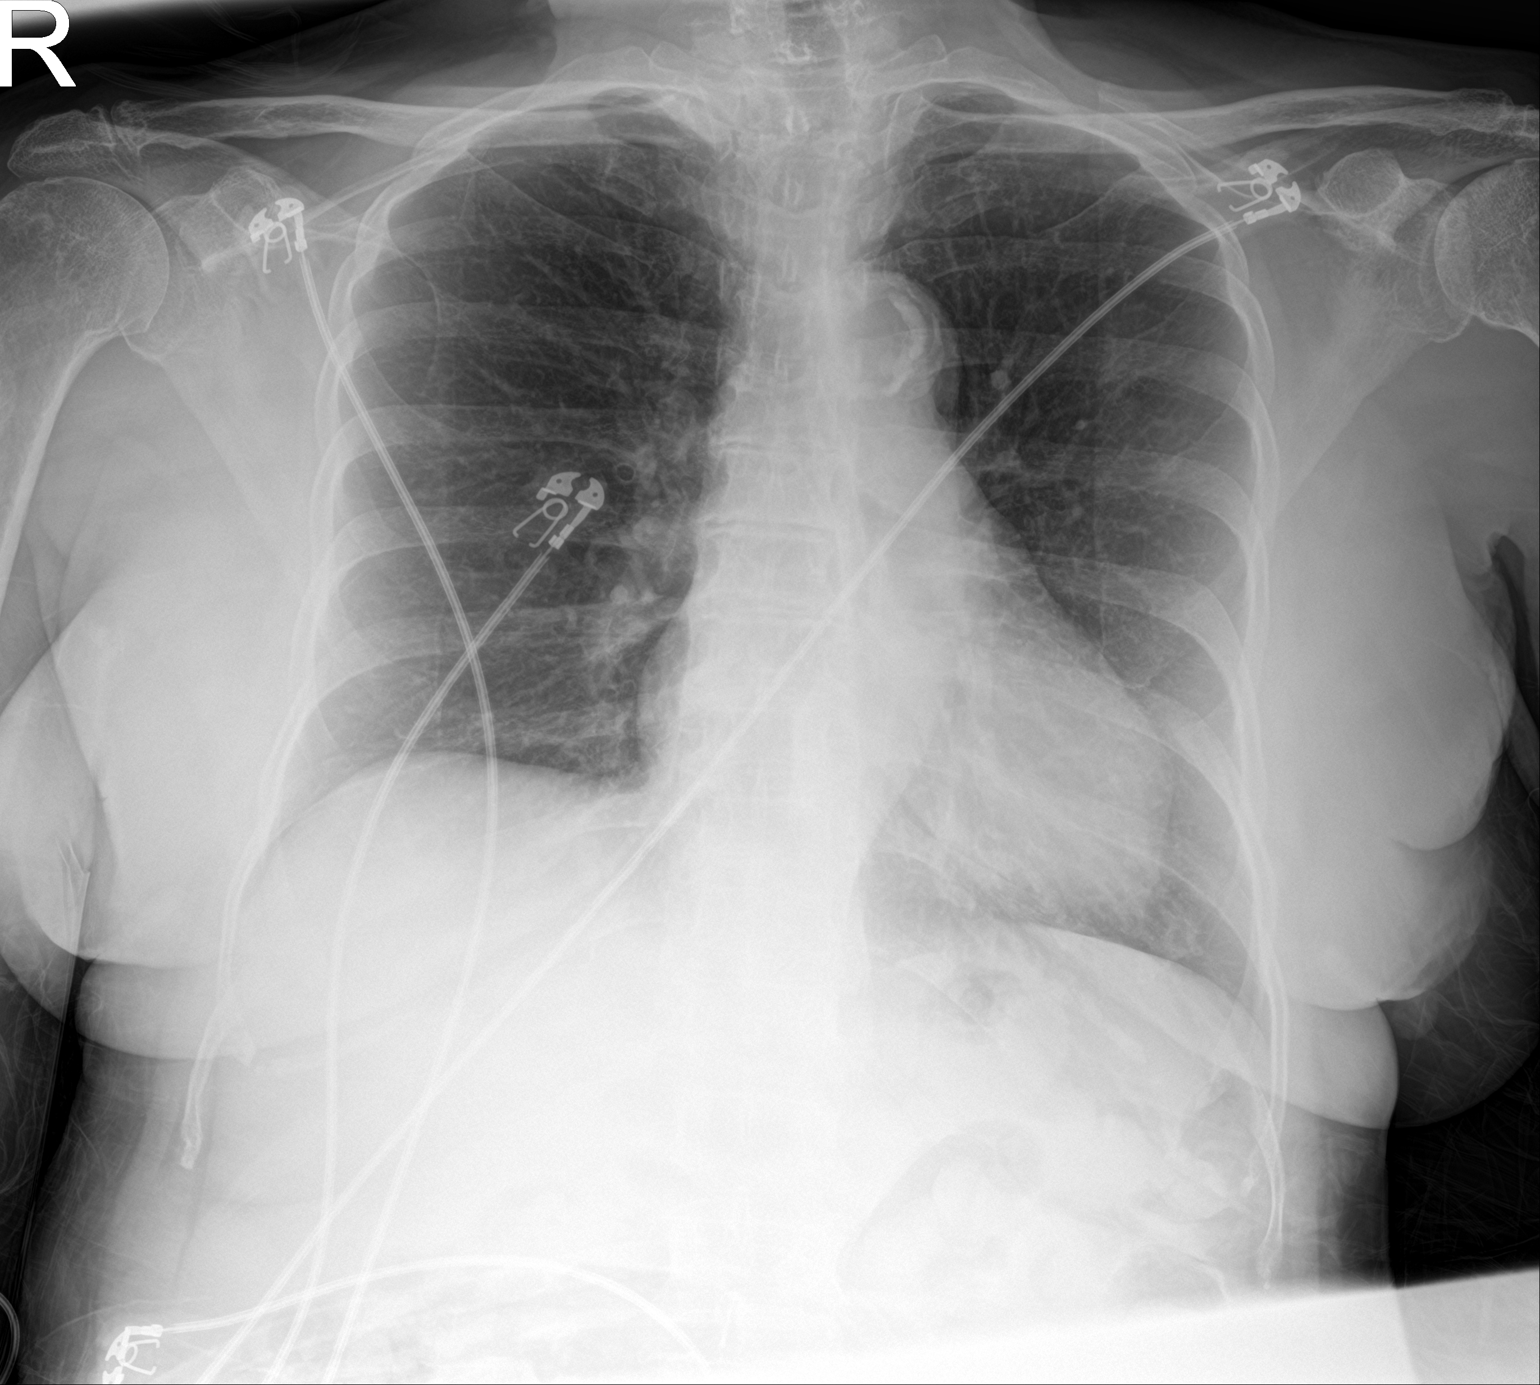

[2 of 2 positions shown; findings below may reference images not displayed]

FINDINGS: Frontal and lateral views of the chest demonstrate an unremarkable
cardiac silhouette. Dense atherosclerosis of the aortic arch. No
airspace disease, effusion, or pneumothorax. No acute bony
abnormalities.
IMPRESSION: 1. No acute intrathoracic process.

## 2020-07-21 MED ORDER — ATORVASTATIN CALCIUM 80 MG PO TABS
80.0000 mg | ORAL_TABLET | Freq: Every day | ORAL | Status: DC
  Administered 2020-07-21 – 2020-07-26 (×6): 80 mg via ORAL
  Filled 2020-07-21: qty 2
  Filled 2020-07-21 (×5): qty 1

## 2020-07-21 MED ORDER — ACETAMINOPHEN 650 MG RE SUPP: 650.0000 mg | RECTAL | Status: DC | PRN

## 2020-07-21 MED ORDER — ACETAMINOPHEN 325 MG PO TABS
650.0000 mg | ORAL_TABLET | ORAL | Status: DC | PRN
  Administered 2020-07-21 – 2020-07-26 (×7): 650 mg via ORAL
  Filled 2020-07-21 (×7): qty 2

## 2020-07-21 MED ORDER — ACETAMINOPHEN 160 MG/5ML PO SOLN: 650.0000 mg | ORAL | Status: DC | PRN

## 2020-07-21 MED ORDER — ALBUTEROL SULFATE HFA 108 (90 BASE) MCG/ACT IN AERS
1.0000 | INHALATION_SPRAY | RESPIRATORY_TRACT | Status: DC | PRN
  Filled 2020-07-21: qty 6.7

## 2020-07-21 MED ORDER — OXYBUTYNIN CHLORIDE ER 5 MG PO TB24
5.0000 mg | ORAL_TABLET | Freq: Every day | ORAL | Status: DC
  Administered 2020-07-22 – 2020-07-25 (×4): 5 mg via ORAL
  Filled 2020-07-21 (×5): qty 1

## 2020-07-21 MED ORDER — POTASSIUM CHLORIDE 20 MEQ PO PACK
40.0000 meq | PACK | Freq: Once | ORAL | Status: AC
  Administered 2020-07-21: 40 meq via ORAL
  Filled 2020-07-21: qty 2

## 2020-07-21 MED ORDER — STROKE: EARLY STAGES OF RECOVERY BOOK
Freq: Once | Status: AC
  Filled 2020-07-21: qty 1

## 2020-07-21 MED ORDER — LEVOTHYROXINE SODIUM 50 MCG PO TABS
50.0000 ug | ORAL_TABLET | Freq: Every day | ORAL | Status: DC
  Administered 2020-07-21 – 2020-07-26 (×6): 50 ug via ORAL
  Filled 2020-07-21 (×6): qty 1

## 2020-07-21 MED ORDER — CLOPIDOGREL BISULFATE 75 MG PO TABS
75.0000 mg | ORAL_TABLET | Freq: Every day | ORAL | Status: DC
  Administered 2020-07-21 – 2020-07-22 (×2): 75 mg via ORAL
  Filled 2020-07-21 (×2): qty 1

## 2020-07-21 MED ORDER — PRAVASTATIN SODIUM 40 MG PO TABS: 40.0000 mg | ORAL_TABLET | Freq: Every day | ORAL | Status: DC

## 2020-07-21 MED ORDER — SODIUM CHLORIDE 0.9 % IV SOLN: INTRAVENOUS | Status: DC

## 2020-07-21 MED ORDER — ASPIRIN 81 MG PO CHEW
81.0000 mg | CHEWABLE_TABLET | Freq: Every day | ORAL | Status: DC
  Administered 2020-07-21 – 2020-07-22 (×2): 81 mg via ORAL
  Filled 2020-07-21 (×2): qty 1

## 2020-07-21 NOTE — Progress Notes (Signed)
Inpatient Rehab Admissions Coordinator Note:   Per therapy recommendations, pt was screened for CIR candidacy by Estill Dooms, PT, DPT.  At this time we are recommending CIR consult and I will place an order per our protocol.  Please contact me with questions.   Estill Dooms, PT, DPT [phone] 07/21/20 12:42 PM

## 2020-07-21 NOTE — Progress Notes (Addendum)
STROKE TEAM PROGRESS NOTE   INTERVAL HISTORY Patient remains in ED room where we say her. Conversant.  I have personally reviewed history of presenting illness with the patient, electronic medical records and imaging films in PACS.  She presented with facial droop and left-sided weakness secondary to right corona radiata infarct is seen on MRI.  CT angiogram showed no significant large vessel stenosis or acute occlusion.  Echocardiogram is pending.  Hemoglobin A1c is 5.4.  LDL cholesterol is 125 mg percent. Vitals:   07/21/20 0945 07/21/20 1000 07/21/20 1015 07/21/20 1030  BP: (!) 156/85 (!) 170/83 138/72 (!) 143/79  Pulse: 83 92 78 81  Resp: (!) 22 (!) Temp:      TempSrc:      SpO2: 96% 99% 95% 97%   CBC:  Recent Labs  Lab 07/20/20 2001 07/20/20 2008  WBC 10.8*  --   NEUTROABS 6.4  --   HGB 13.3 14.3  HCT 42.0 42.0  MCV 96.3  --   PLT 252  --    Basic Metabolic Panel:  Recent Labs  Lab 07/20/20 2001 07/20/20 2008  NA 137 139  K 3.3* 3.3*  CL 100 102  CO2 25  --   GLUCOSE 139* 136*  BUN 16 19  CREATININE 0.92 0.80  CALCIUM 10.0  --    Lipid Panel:  Recent Labs  Lab 07/21/20 0330  CHOL 198  TRIG 104  HDL 52  CHOLHDL 3.8  VLDL 21  LDLCALC 960*   HgbA1c:  Recent Labs  Lab 07/21/20 0330  HGBA1C 5.4   Urine Drug Screen:  Recent Labs  Lab 07/20/20 2037  LABOPIA NONE DETECTED  COCAINSCRNUR NONE DETECTED  LABBENZ NONE DETECTED  AMPHETMU NONE DETECTED  THCU NONE DETECTED  LABBARB NONE DETECTED    Alcohol Level  Recent Labs  Lab 07/20/20 2001  ETH <10    IMAGING past 24 hours DG Chest 2 View  Result Date: 07/21/2020 CLINICAL DATA:  CVA symptoms EXAM: CHEST - 2 VIEW COMPARISON:  None. FINDINGS: Frontal and lateral views of the chest demonstrate an unremarkable cardiac silhouette. Dense atherosclerosis of the aortic arch. No airspace disease, effusion, or pneumothorax. No acute bony abnormalities. IMPRESSION: 1. No acute intrathoracic  process. Electronically Signed   By: Sharlet Salina M.D.   On: 07/21/2020 02:18   MR BRAIN WO CONTRAST  Result Date: 07/20/2020 CLINICAL DATA:  86 year old female code stroke presentation, left side weakness. EXAM: MRI HEAD WITHOUT CONTRAST TECHNIQUE: Multiplanar, multiecho pulse sequences of the brain and surrounding structures were obtained without intravenous contrast. COMPARISON:  CT head, CTA head and neck earlier today. FINDINGS: Brain: Discontinuous curvilinear restricted diffusion is noted from the posterior right corona radiata tracking into the posterior right lentiform, best seen on series 7, image 53). Mild if any associated T2 and FLAIR hyperintensity. No hemorrhage or mass effect. No other restricted diffusion. Patchy and scattered bilateral cerebral white matter T2 and FLAIR hyperintensity elsewhere. No cortical encephalomalacia. No chronic cerebral blood products. Moderate T2 heterogeneity in the bilateral deep gray nuclei, most resembling lacunar infarcts in the basal ganglia. Brainstem and cerebellum are within normal limits for age. No midline shift, mass effect, evidence of mass lesion, ventriculomegaly, extra-axial collection or acute intracranial hemorrhage. Cervicomedullary junction and pituitary are within normal limits. Vascular: Major intracranial vascular flow voids are preserved. Skull and upper cervical spine: Partially visible multilevel cervical spine degeneration. Mild degenerative spinal stenosis suspected at C4-C5. Normal bone marrow signal. Sinuses/Orbits: Postoperative  changes to both globes, otherwise negative. Other: Visible internal auditory structures appear normal. Visible scalp and face appear negative. IMPRESSION: 1. Positive for acute small vessel infarct in the posterior right corona radiata and lentiform. No associated hemorrhage or mass effect. 2. Underlying chronic small vessel disease, including in the bilateral basal ganglia. 3. Mild degenerative spinal stenosis  suspected at C4-C5. Electronically Signed   By: Odessa FlemingH  Hall M.D.   On: 07/20/2020 22:47   CT HEAD CODE STROKE WO CONTRAST  Result Date: 07/20/2020 CLINICAL DATA:  Code stroke.  Left-sided weakness and slurred speech EXAM: CT HEAD WITHOUT CONTRAST CT ANGIOGRAPHY OF THE HEAD AND NECK TECHNIQUE: Contiguous axial images were obtained from the base of the skull through the vertex without intravenous contrast. Multidetector CT imaging of the head and neck was performed using the standard protocol during bolus administration of intravenous contrast. Multiplanar CT image reconstructions and MIPs were obtained to evaluate the vascular anatomy. Carotid stenosis measurements (when applicable) are obtained utilizing NASCET criteria, using the distal internal carotid diameter as the denominator. CONTRAST:  50mL OMNIPAQUE IOHEXOL 350 MG/ML SOLN COMPARISON:  None. FINDINGS: CT HEAD FINDINGS Brain: There is no mass, hemorrhage or extra-axial collection. The size and configuration of the ventricles and extra-axial CSF spaces are normal. There is hypoattenuation of the periventricular white matter, most commonly indicating chronic ischemic microangiopathy. Vascular: No abnormal hyperdensity of the major intracranial arteries or dural venous sinuses. No intracranial atherosclerosis. Skull: The visualized skull base, calvarium and extracranial soft tissues are normal. Sinuses/Orbits: No fluid levels or advanced mucosal thickening of the visualized paranasal sinuses. No mastoid or middle ear effusion. The orbits are normal. ASPECTS (Alberta Stroke Program Early CT Score) - Ganglionic level infarction (caudate, lentiform nuclei, internal capsule, insula, M1-M3 cortex): 7 - Supraganglionic infarction (M4-M6 cortex): 3 Total score (0-10 with 10 being normal): 10 CTA NECK FINDINGS SKELETON: There is no bony spinal canal stenosis. No lytic or blastic lesion. OTHER NECK: Normal pharynx, larynx and major salivary glands. No cervical  lymphadenopathy. Unremarkable thyroid gland. UPPER CHEST: No pneumothorax or pleural effusion. No nodules or masses. AORTIC ARCH: There is calcific atherosclerosis of the aortic arch. There is no aneurysm, dissection or hemodynamically significant stenosis of the visualized portion of the aorta. Conventional 3 vessel aortic branching pattern. The visualized proximal subclavian arteries are widely patent. RIGHT CAROTID SYSTEM: No dissection, occlusion or aneurysm. Mild atherosclerotic calcification at the carotid bifurcation without hemodynamically significant stenosis. LEFT CAROTID SYSTEM: No dissection, occlusion or aneurysm. Mild atherosclerotic calcification at the carotid bifurcation without hemodynamically significant stenosis. VERTEBRAL ARTERIES: Left dominant configuration. Both origins are clearly patent. There is no dissection, occlusion or flow-limiting stenosis to the skull base (V1-V3 segments). CTA HEAD FINDINGS POSTERIOR CIRCULATION: --Vertebral arteries: Mild atherosclerotic calcification of the left V4 segment. Otherwise normal. --Inferior cerebellar arteries: Normal. --Basilar artery: Normal. --Superior cerebellar arteries: Normal. --Posterior cerebral arteries (PCA): Normal. ANTERIOR CIRCULATION: --Intracranial internal carotid arteries: Normal. --Anterior cerebral arteries (ACA): Normal. Both A1 segments are present. Patent anterior communicating artery (a-comm). --Middle cerebral arteries (MCA): Normal. VENOUS SINUSES: As permitted by contrast timing, patent. ANATOMIC VARIANTS: Hypoplastic right ACA A1 segment, a common variant. Review of the MIP images confirms the above findings. IMPRESSION: 1. No emergent large vessel occlusion or high-grade stenosis of the head or neck. Aortic Atherosclerosis (ICD10-I70.0). Electronically Signed   By: Deatra RobinsonKevin  Herman M.D.   On: 07/20/2020 20:34   CT ANGIO HEAD CODE STROKE  Result Date: 07/20/2020 CLINICAL DATA:  Code stroke.  Left-sided weakness  and slurred  speech EXAM: CT HEAD WITHOUT CONTRAST CT ANGIOGRAPHY OF THE HEAD AND NECK TECHNIQUE: Contiguous axial images were obtained from the base of the skull through the vertex without intravenous contrast. Multidetector CT imaging of the head and neck was performed using the standard protocol during bolus administration of intravenous contrast. Multiplanar CT image reconstructions and MIPs were obtained to evaluate the vascular anatomy. Carotid stenosis measurements (when applicable) are obtained utilizing NASCET criteria, using the distal internal carotid diameter as the denominator. CONTRAST:  17mL OMNIPAQUE IOHEXOL 350 MG/ML SOLN COMPARISON:  None. FINDINGS: CT HEAD FINDINGS Brain: There is no mass, hemorrhage or extra-axial collection. The size and configuration of the ventricles and extra-axial CSF spaces are normal. There is hypoattenuation of the periventricular white matter, most commonly indicating chronic ischemic microangiopathy. Vascular: No abnormal hyperdensity of the major intracranial arteries or dural venous sinuses. No intracranial atherosclerosis. Skull: The visualized skull base, calvarium and extracranial soft tissues are normal. Sinuses/Orbits: No fluid levels or advanced mucosal thickening of the visualized paranasal sinuses. No mastoid or middle ear effusion. The orbits are normal. ASPECTS (Alberta Stroke Program Early CT Score) - Ganglionic level infarction (caudate, lentiform nuclei, internal capsule, insula, M1-M3 cortex): 7 - Supraganglionic infarction (M4-M6 cortex): 3 Total score (0-10 with 10 being normal): 10 CTA NECK FINDINGS SKELETON: There is no bony spinal canal stenosis. No lytic or blastic lesion. OTHER NECK: Normal pharynx, larynx and major salivary glands. No cervical lymphadenopathy. Unremarkable thyroid gland. UPPER CHEST: No pneumothorax or pleural effusion. No nodules or masses. AORTIC ARCH: There is calcific atherosclerosis of the aortic arch. There is no aneurysm, dissection or  hemodynamically significant stenosis of the visualized portion of the aorta. Conventional 3 vessel aortic branching pattern. The visualized proximal subclavian arteries are widely patent. RIGHT CAROTID SYSTEM: No dissection, occlusion or aneurysm. Mild atherosclerotic calcification at the carotid bifurcation without hemodynamically significant stenosis. LEFT CAROTID SYSTEM: No dissection, occlusion or aneurysm. Mild atherosclerotic calcification at the carotid bifurcation without hemodynamically significant stenosis. VERTEBRAL ARTERIES: Left dominant configuration. Both origins are clearly patent. There is no dissection, occlusion or flow-limiting stenosis to the skull base (V1-V3 segments). CTA HEAD FINDINGS POSTERIOR CIRCULATION: --Vertebral arteries: Mild atherosclerotic calcification of the left V4 segment. Otherwise normal. --Inferior cerebellar arteries: Normal. --Basilar artery: Normal. --Superior cerebellar arteries: Normal. --Posterior cerebral arteries (PCA): Normal. ANTERIOR CIRCULATION: --Intracranial internal carotid arteries: Normal. --Anterior cerebral arteries (ACA): Normal. Both A1 segments are present. Patent anterior communicating artery (a-comm). --Middle cerebral arteries (MCA): Normal. VENOUS SINUSES: As permitted by contrast timing, patent. ANATOMIC VARIANTS: Hypoplastic right ACA A1 segment, a common variant. Review of the MIP images confirms the above findings. IMPRESSION: 1. No emergent large vessel occlusion or high-grade stenosis of the head or neck. Aortic Atherosclerosis (ICD10-I70.0). Electronically Signed   By: Deatra Robinson M.D.   On: 07/20/2020 20:34   CT ANGIO NECK CODE STROKE  Result Date: 07/20/2020 CLINICAL DATA:  Code stroke.  Left-sided weakness and slurred speech EXAM: CT HEAD WITHOUT CONTRAST CT ANGIOGRAPHY OF THE HEAD AND NECK TECHNIQUE: Contiguous axial images were obtained from the base of the skull through the vertex without intravenous contrast. Multidetector CT  imaging of the head and neck was performed using the standard protocol during bolus administration of intravenous contrast. Multiplanar CT image reconstructions and MIPs were obtained to evaluate the vascular anatomy. Carotid stenosis measurements (when applicable) are obtained utilizing NASCET criteria, using the distal internal carotid diameter as the denominator. CONTRAST:  75mL OMNIPAQUE IOHEXOL 350 MG/ML SOLN COMPARISON:  None. FINDINGS: CT HEAD FINDINGS Brain: There is no mass, hemorrhage or extra-axial collection. The size and configuration of the ventricles and extra-axial CSF spaces are normal. There is hypoattenuation of the periventricular white matter, most commonly indicating chronic ischemic microangiopathy. Vascular: No abnormal hyperdensity of the major intracranial arteries or dural venous sinuses. No intracranial atherosclerosis. Skull: The visualized skull base, calvarium and extracranial soft tissues are normal. Sinuses/Orbits: No fluid levels or advanced mucosal thickening of the visualized paranasal sinuses. No mastoid or middle ear effusion. The orbits are normal. ASPECTS (Alberta Stroke Program Early CT Score) - Ganglionic level infarction (caudate, lentiform nuclei, internal capsule, insula, M1-M3 cortex): 7 - Supraganglionic infarction (M4-M6 cortex): 3 Total score (0-10 with 10 being normal): 10 CTA NECK FINDINGS SKELETON: There is no bony spinal canal stenosis. No lytic or blastic lesion. OTHER NECK: Normal pharynx, larynx and major salivary glands. No cervical lymphadenopathy. Unremarkable thyroid gland. UPPER CHEST: No pneumothorax or pleural effusion. No nodules or masses. AORTIC ARCH: There is calcific atherosclerosis of the aortic arch. There is no aneurysm, dissection or hemodynamically significant stenosis of the visualized portion of the aorta. Conventional 3 vessel aortic branching pattern. The visualized proximal subclavian arteries are widely patent. RIGHT CAROTID SYSTEM: No  dissection, occlusion or aneurysm. Mild atherosclerotic calcification at the carotid bifurcation without hemodynamically significant stenosis. LEFT CAROTID SYSTEM: No dissection, occlusion or aneurysm. Mild atherosclerotic calcification at the carotid bifurcation without hemodynamically significant stenosis. VERTEBRAL ARTERIES: Left dominant configuration. Both origins are clearly patent. There is no dissection, occlusion or flow-limiting stenosis to the skull base (V1-V3 segments). CTA HEAD FINDINGS POSTERIOR CIRCULATION: --Vertebral arteries: Mild atherosclerotic calcification of the left V4 segment. Otherwise normal. --Inferior cerebellar arteries: Normal. --Basilar artery: Normal. --Superior cerebellar arteries: Normal. --Posterior cerebral arteries (PCA): Normal. ANTERIOR CIRCULATION: --Intracranial internal carotid arteries: Normal. --Anterior cerebral arteries (ACA): Normal. Both A1 segments are present. Patent anterior communicating artery (a-comm). --Middle cerebral arteries (MCA): Normal. VENOUS SINUSES: As permitted by contrast timing, patent. ANATOMIC VARIANTS: Hypoplastic right ACA A1 segment, a common variant. Review of the MIP images confirms the above findings. IMPRESSION: 1. No emergent large vessel occlusion or high-grade stenosis of the head or neck. Aortic Atherosclerosis (ICD10-I70.0). Electronically Signed   By: Deatra Robinson M.D.   On: 07/20/2020 20:34    PHYSICAL EXAM Pleasant elderly African-American lady not in distress. . Afebrile. Head is nontraumatic. Neck is supple without bruit.    Cardiac exam no murmur or gallop. Lungs are clear to auscultation. Distal pulses are well felt. Neurological Exam :  Awake alert oriented place and person.  Diminished attention, registration and recall.  Speech is clear but is without dysarthria.  No aphasia.  Extraocular movements are full range without nystagmus.  Blinks to threat bilaterally.  Fundi not visualized.  Left lower facial weakness.   Tongue midline motor system exam no upper or lower extremity drift but weakness of left grip intrinsic hand muscles and diminished fine finger movements on the left and orbits right over left upper extremity.  Trace weakness of left hip flexors and ankle dorsiflexors.  Sensation is intact.  Coordination slow but accurate.  Gait not tested. NIH stroke scale 3 Premorbid MRS 0 ASSESSMENT/PLAN Ms. SOMALY MARTENEY is a 86 y.o. female with history of hypertension, hypothyroidism, hyperlipidemia presenting with L sided weakness.   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 ASPECTS 10.     CTA head & neck no ELVO. Aortic atherosclerosis.   MRI  Posterior R corona radiata and lentiform nucleus infarct. Small vessel disease. Mild spinal stenosis C4-5  2D Echo pending  LDL 125  HgbA1c 5.4  VTE prophylaxis - SCDs   aspirin 81 mg daily prior to admission, now on aspirin 81 mg daily and clopidogrel 75 mg daily. Continue DAPT x 3 weeks then plavix alone  Therapy recommendations:  pending   Disposition:  pending  Ilives w. Daughter)  Hypertension  Stable on the high side 170-180s . Permissive hypertension (OK if < 220/120) but gradually normalize in 5-7 days . Long-term BP goal normotensive  Hyperlipidemia  Home meds:  pravachol 40  Now on lipitor 80  LDL 125, goal < 70  Continue statin at discharge  Other Stroke Risk Factors  Advanced age  Other Active Problems  Hypothyroid on synthroid  Hospital day # 1 She presented with left-sided weakness secondary to right subcortical infarct from small vessel disease.  Recommend dual antiplatelet therapy aspirin Plavix for 3 weeks followed by Plavix alone.  Aggressive risk factor modification.  Long discussion with patient  and care team about stroke evaluation, prevention and treatment and answering questions.  Greater than 50% time during this 35-minute visit was spent on counseling and coordination of care about her stroke and discussion about prevention and treatment plan. Delia Heady, MD Medical Director [facility] Pager: [phone] 07/21/2020 4:12 PMTo contact Stroke Continuity provider, please refer to [email or web address]. After hours, contact General Neurology

## 2020-07-21 NOTE — Evaluation (Signed)
Physical Therapy Evaluation Patient Details Name: Nicole [name] MRN: [id number] DOB: June 01, 1930 Today's Date: 07/21/2020   History of Present Illness  Patient is 86 y.o. female with PMH significant for HTN, hypothyroidism, OA, HLD, glaucoma. She presented to [facility] with sypmtoms of stroke. MRI positive for acute small vessel infarct in the posterior right corona radiata and lentiform. No associated hemorrhage or mass effect.    Clinical Impression  Nicole [name] is 86 y.o. female admitted with above HPI and diagnosis. Patient is currently limited by functional impairments below (see PT problem list). Patient lives with her daughter who is available for 24/7 assist and is independent with no device for mobility at baseline. Patient currently requires min assist for bed mob, and mod assist for functional transfers with Bil UE support. Attempted pre-gait activity at EOB and pt's LE's buckling (Lt>Rt) with side stepping. Pt also with poor coordination and sequencing of steps attempting to cross Lt over Rt in scissoring pattern. Patient will benefit from continued skilled PT interventions to address impairments and progress independence with mobility, recommending intense comprehensive inpatient rehab (CIR) for post-acute therapy needs to maximize return mobility to PLOF. Acute PT will progress as able.     Follow Up Recommendations CIR;Supervision/Assistance - 24 hour    Equipment Recommendations  Other (comment) (TBA)    Recommendations for Other Services OT consult;Rehab consult     Precautions / Restrictions Precautions Precautions: Fall Restrictions Weight Bearing Restrictions: No      Mobility  Bed Mobility Overal bed mobility: Needs Assistance Bed Mobility: Supine to Sit;Sit to Supine     Supine to sit: Min assist Sit to supine: Min assist   General bed mobility comments: Pt sat up to long sit in bed and required min assist to steady and bring LE's off EOB. Min assist/guard to  steady at EOB. Pt able to initiate lowering trunk to return to supine and raising LE's onto bed, light min assist to complete.   Transfers Overall transfer level: Needs assistance Equipment used: 2 person hand held assist Transfers: Sit to/from Stand Sit to Stand: Min assist;Mod assist;From elevated surface         General transfer comment: Min-Mod +2 assist from stretcher with HHA. Pt initially with good power up to rise. Pt's LE's fatigued and buckling requiring cues to extend knee/hips for posture and maintain standing.  Ambulation/Gait        General Gait Details: Attempted lateral sidestepping at EOB, pt unable to sequence stepping safely attempting to cross Lt over Rt. Lt LE buckling with Rt stepping. Returned to sit EOB.   Stairs            Wheelchair Mobility    Modified Rankin (Stroke Patients Only)       Balance Overall balance assessment: Needs assistance Sitting-balance support: Bilateral upper extremity supported Sitting balance-Leahy Scale: Fair Sitting balance - Comments: pt abel to sit and raise UE for PT to don gait belt.   Standing balance support: During functional activity;Bilateral upper extremity supported Standing balance-Leahy Scale: Poor Standing balance comment: heavily reliant on UE/external support.                Pertinent Vitals/Pain Pain Assessment: No/denies pain    Home Living Family/patient expects to be discharged to:: Private residence Living Arrangements: Children (lives with daughter) Available Help at Discharge: Family Type of Home: House Home Access: Stairs to enter Entrance Stairs-Rails: None Entrance Stairs-Number of Steps: 1 Home Layout: Two level;Able to live on main  level with bedroom/bathroom;Bed/bath upstairs;Full bath on main level   Additional Comments: [name] lives with her and works from home part time. She does about 2 wks in office/month. She has ability to work from home for ~6 weeks at a time.      Prior Function Level of Independence: Independent         Comments: Pt sleeps in upstairs bedroom and daughter sleeps downstairs. Full bath on both levels. Pt stands in shower and is independent with ADL's. She is still driving, shopping, cooking, cleaning. Pt was not using AD for mobility  PTA.     Hand Dominance   Dominant Hand: Right    Extremity/Trunk Assessment   Upper Extremity Assessment Upper Extremity Assessment: Defer to OT evaluation    Lower Extremity Assessment Lower Extremity Assessment: Generalized weakness;RLE deficits/detail;LLE deficits/detail RLE Deficits / Details: pt with 3+/5 for hip flexion, 4/5 for knee flex/ext, and ankle dorsi/plantarflexion.  RLE Sensation: WNL RLE Coordination: WNL LLE Deficits / Details: pt with 3-/5 for hip flexion, 4/5 for knee flex/ext, and ankle dorsi/plantarflexion.  LLE Sensation: WNL LLE Coordination: decreased gross motor (poor ability to sequence stepping in standing.)    Cervical / Trunk Assessment Cervical / Trunk Assessment: Kyphotic  Communication   Communication: HOH  Cognition Arousal/Alertness: Awake/alert Behavior During Therapy: WFL for tasks assessed/performed Overall Cognitive Status: Within Functional Limits for tasks assessed        General Comments: pt pleasent and A&Ox4. She follows commands correctly and consitently. pt very HOH.      General Comments      Exercises     Assessment/Plan    PT Assessment Patient needs continued PT services  PT Problem List Decreased strength;Decreased activity tolerance;Decreased balance;Decreased mobility;Decreased coordination;Decreased knowledge of use of DME;Decreased safety awareness;Decreased knowledge of precautions       PT Treatment Interventions DME instruction;Stair training;Gait training;Functional mobility training;Therapeutic activities;Therapeutic exercise;Balance training;Neuromuscular re-education;Patient/family education    PT Goals  (Current goals can be found in the Care Plan section)  Acute Rehab PT Goals Patient Stated Goal: get back independence PT Goal Formulation: With patient/family Time For Goal Achievement: 08/04/20 Potential to Achieve Goals: Good    Frequency Min 4X/week   Barriers to discharge        AM-PAC PT "6 Clicks" Mobility  Outcome Measure Help needed turning from your back to your side while in a flat bed without using bedrails?: None Help needed moving from lying on your back to sitting on the side of a flat bed without using bedrails?: A Little Help needed moving to and from a bed to a chair (including a wheelchair)?: A Lot Help needed standing up from a chair using your arms (e.g., wheelchair or bedside chair)?: A Lot Help needed to walk in hospital room?: Total Help needed climbing 3-5 steps with a railing? : Total 6 Click Score: 13    End of Session Equipment Utilized During Treatment: Gait belt Activity Tolerance: Patient tolerated treatment well Patient left: in bed;with call bell/phone within reach;with family/visitor present Nurse Communication: Mobility status PT Visit Diagnosis: Unsteadiness on feet (R26.81);Other abnormalities of gait and mobility (R26.89);Muscle weakness (generalized) (M62.81);Difficulty in walking, not elsewhere classified (R26.2);Hemiplegia and hemiparesis Hemiplegia - Right/Left: Left Hemiplegia - dominant/non-dominant: Non-dominant Hemiplegia - caused by: Cerebral infarction    Time: 5027-7412 PT Time Calculation (min) (ACUTE ONLY): 15 min   Charges:   PT Evaluation $PT Eval Moderate Complexity: 1 Mod         Renaldo Fiddler PT, DPT  Acute Rehabilitation Services  Office [phone] Pager [phone]  07/21/2020 11:31 AM

## 2020-07-21 NOTE — Progress Notes (Signed)
[facility]  PROGRESS NOTE  Nicole [name] ZOX:[id number] DOB: 05/02/1930 DOA: 07/20/2020 PCP: Lurena Nida, MD Admit date - 07/20/2020   Admitting Physician Therisa Doyne, MD  Outpatient Primary MD for the patient is Lurena Nida, MD  LOS - 1 Brief Narrative   Nicole [name] is a 86 y.o. year old female with medical history significant for HTN, HLD thyroidism who presented on 07/20/2020 with new left-sided weakness for 1 day and was found to have acute CVA secondary to small vessel disease.    Subjective  Today she has no acute complaints.  Is ready to eat as she has just passed her bedside swallow.  Daughter is at bedside  A & P   Acute CVA with residual left-sided deficits (left upper lower extremity weakness, some slight dysarthria).  Occurred in setting of hypertension and hyperlipidemia and MRI showed small vessel infarct in the posterior right corona radiata and lentiform CTA neck shows ganglionic level infarction (caudate, lentiform nuclei, internal capsule, insula) with no signs of large vessel occlusion or high-grade stenosis.  Patient passed bedside swallow.  A1c 5.4, LDL 125 -Continue aspirin, Plavix x3 weeks -Continue high intensity atorvastatin started in the hospital -Pending TTE, continue monitoring on telemetry -PT recommends CIR -CIR consulted  Hypertension.  Currently allowing permissive hypertension up to 220/120 as directed by neurology -Holding home BP medications  Hypothyroidism -Continue home Synthroid  Hypokalemia mild, likely in setting of home HCTZ -Check magnesium -Replete potassium -Monitor BMP  Leukocytosis, suspect stress related to acute CVA.  Has no localizing symptoms of infection.  Remains afebrile.  UA unremarkable.  Chest x-ray unremarkable. -Monitor CBC    Family Communication  : Daughter updated at bedside, requested I also updated her cousin Dr. Fonnie Birkenhead [phone]  Code Status : Full  Disposition Plan  :   Patient is from home. Anticipated d/c date: 2 to 3 days. Barriers to d/c or necessity for inpatient status:  Continue work-up for stroke, PT recommends CIR Consults  : Neurology  Procedures  : TTE pending  DVT Prophylaxis  : Aspirin, Plavix  Lab Results  Component Value Date   PLT 252 07/20/2020    Diet :  Diet Order            Diet Heart Room service appropriate? Yes; Fluid consistency: Thin  Diet effective now                  Inpatient Medications Scheduled Meds:  aspirin  81 mg Oral Daily   atorvastatin  80 mg Oral Daily   clopidogrel  75 mg Oral Daily   levothyroxine  50 mcg Oral QAC breakfast   oxybutynin  5 mg Oral QHS   Continuous Infusions:  sodium chloride 75 mL/hr at 07/21/20 0930   PRN Meds:.acetaminophen **OR** acetaminophen (TYLENOL) oral liquid 160 mg/5 mL **OR** acetaminophen, albuterol  Antibiotics  :   Anti-infectives (From admission, onward)   None       Objective   Vitals:   07/21/20 1000 07/21/20 1015 07/21/20 1030 07/21/20 1230  BP: (!) 170/83 138/72 (!) 143/79 129/65  Pulse: 92 78 81 78  Resp: (!) Temp:      TempSrc:      SpO2: 99% 95% 97% 95%    SpO2: 95 %  Wt Readings from Last 3 Encounters:  07/06/15 70.8 kg     Intake/Output Summary (Last 24 hours) at 07/21/2020 1326 Last data filed at 07/21/2020 0522 Gross per 24  hour  Intake 200 ml  Output 2200 ml  Net -2000 ml    Physical Exam:     Awake Alert, Oriented X 3, Normal affect 4/5 strength in left upper and 3 out of 5 in left lower extremity, follows commands, no obvious facial droop, slightly slurring speech [place].AT, Normal respiratory effort on room air, CTAB RRR,No Gallops,Rubs or new Murmurs,  +ve B.Sounds, Abd Soft, No tenderness, No rebound, guarding or rigidity. No Cyanosis, No new Rash or bruise   I have personally reviewed the following:   Data Reviewed:  CBC Recent Labs  Lab 07/20/20 2001 07/20/20 2008  WBC 10.8*  --   HGB  13.3 14.3  HCT 42.0 42.0  PLT 252  --   MCV 96.3  --   MCH 30.5  --   MCHC 31.7  --   RDW 12.2  --   LYMPHSABS 3.5  --   MONOABS 0.6  --   EOSABS 0.2  --   BASOSABS 0.1  --     Chemistries  Recent Labs  Lab 07/20/20 2001 07/20/20 2008  NA 137 139  K 3.3* 3.3*  CL 100 102  CO2 25  --   GLUCOSE 139* 136*  BUN 16 19  CREATININE 0.92 0.80  CALCIUM 10.0  --   AST 30  --   ALT 23  --   ALKPHOS 48  --   BILITOT 1.1  --    ------------------------------------------------------------------------------------------------------------------ Recent Labs    07/21/20 0330  CHOL 198  HDL 52  LDLCALC 125*  TRIG 104  CHOLHDL 3.8    Lab Results  Component Value Date   HGBA1C 5.4 07/21/2020   ------------------------------------------------------------------------------------------------------------------ No results for input(s): TSH, T4TOTAL, T3FREE, THYROIDAB in the last 72 hours.  Invalid input(s): FREET3 ------------------------------------------------------------------------------------------------------------------ Recent Labs    07/21/20 0330  VITAMINB12 1,114*    Coagulation profile Recent Labs  Lab 07/20/20 2001  INR 0.9    No results for input(s): DDIMER in the last 72 hours.  Cardiac Enzymes No results for input(s): CKMB, TROPONINI, MYOGLOBIN in the last 168 hours.  Invalid input(s): CK ------------------------------------------------------------------------------------------------------------------ No results found for: BNP  Micro Results Recent Results (from the past 240 hour(s))  SARS Coronavirus 2 by RT PCR (hospital order, performed in [facility] hospital lab) Nasopharyngeal Nasopharyngeal Swab     Status: None   Collection Time: 07/21/20  2:16 AM   Specimen: Nasopharyngeal Swab  Result Value Ref Range Status   SARS Coronavirus 2 NEGATIVE NEGATIVE Final    Comment: (NOTE) SARS-CoV-2 target nucleic acids are NOT DETECTED.  The SARS-CoV-2  RNA is generally detectable in upper and lower respiratory specimens during the acute phase of infection. The lowest concentration of SARS-CoV-2 viral copies this assay can detect is 250 copies / mL. A negative result does not preclude SARS-CoV-2 infection and should not be used as the sole basis for treatment or other patient management decisions.  A negative result may occur with improper specimen collection / handling, submission of specimen other than nasopharyngeal swab, presence of viral mutation(s) within the areas targeted by this assay, and inadequate number of viral copies (<250 copies / mL). A negative result must be combined with clinical observations, patient history, and epidemiological information.  Fact Sheet for Patients:   BoilerBrush.com.cy  Fact Sheet for Healthcare Providers: https://pope.com/  This test is not yet approved or  cleared by the [place] FDA and has been authorized for detection and/or diagnosis of SARS-CoV-2 by FDA under  an Emergency Use Authorization (EUA).  This EUA will remain in effect (meaning this test can be used) for the duration of the COVID-19 declaration under Section 564(b)(1) of the Act, 21 U.S.C. section 360bbb-3(b)(1), unless the authorization is terminated or revoked sooner.  Performed at [facility], [address], [place], [place] [address]     Radiology Reports DG Chest 2 View  Result Date: 07/21/2020 CLINICAL DATA:  CVA symptoms EXAM: CHEST - 2 VIEW COMPARISON:  None. FINDINGS: Frontal and lateral views of the chest demonstrate an unremarkable cardiac silhouette. Dense atherosclerosis of the aortic arch. No airspace disease, effusion, or pneumothorax. No acute bony abnormalities. IMPRESSION: 1. No acute intrathoracic process. Electronically Signed   By: Sharlet Salina M.D.   On: 07/21/2020 02:18   MR BRAIN WO CONTRAST  Result Date: 07/20/2020 CLINICAL DATA:   86 year old female code stroke presentation, left side weakness. EXAM: MRI HEAD WITHOUT CONTRAST TECHNIQUE: Multiplanar, multiecho pulse sequences of the brain and surrounding structures were obtained without intravenous contrast. COMPARISON:  CT head, CTA head and neck earlier today. FINDINGS: Brain: Discontinuous curvilinear restricted diffusion is noted from the posterior right corona radiata tracking into the posterior right lentiform, best seen on series 7, image 53). Mild if any associated T2 and FLAIR hyperintensity. No hemorrhage or mass effect. No other restricted diffusion. Patchy and scattered bilateral cerebral white matter T2 and FLAIR hyperintensity elsewhere. No cortical encephalomalacia. No chronic cerebral blood products. Moderate T2 heterogeneity in the bilateral deep gray nuclei, most resembling lacunar infarcts in the basal ganglia. Brainstem and cerebellum are within normal limits for age. No midline shift, mass effect, evidence of mass lesion, ventriculomegaly, extra-axial collection or acute intracranial hemorrhage. Cervicomedullary junction and pituitary are within normal limits. Vascular: Major intracranial vascular flow voids are preserved. Skull and upper cervical spine: Partially visible multilevel cervical spine degeneration. Mild degenerative spinal stenosis suspected at C4-C5. Normal bone marrow signal. Sinuses/Orbits: Postoperative changes to both globes, otherwise negative. Other: Visible internal auditory structures appear normal. Visible scalp and face appear negative. IMPRESSION: 1. Positive for acute small vessel infarct in the posterior right corona radiata and lentiform. No associated hemorrhage or mass effect. 2. Underlying chronic small vessel disease, including in the bilateral basal ganglia. 3. Mild degenerative spinal stenosis suspected at C4-C5. Electronically Signed   By: Odessa Fleming M.D.   On: 07/20/2020 22:47   CT HEAD CODE STROKE WO CONTRAST  Result Date:  07/20/2020 CLINICAL DATA:  Code stroke.  Left-sided weakness and slurred speech EXAM: CT HEAD WITHOUT CONTRAST CT ANGIOGRAPHY OF THE HEAD AND NECK TECHNIQUE: Contiguous axial images were obtained from the base of the skull through the vertex without intravenous contrast. Multidetector CT imaging of the head and neck was performed using the standard protocol during bolus administration of intravenous contrast. Multiplanar CT image reconstructions and MIPs were obtained to evaluate the vascular anatomy. Carotid stenosis measurements (when applicable) are obtained utilizing NASCET criteria, using the distal internal carotid diameter as the denominator. CONTRAST:  34mL OMNIPAQUE IOHEXOL 350 MG/ML SOLN COMPARISON:  None. FINDINGS: CT HEAD FINDINGS Brain: There is no mass, hemorrhage or extra-axial collection. The size and configuration of the ventricles and extra-axial CSF spaces are normal. There is hypoattenuation of the periventricular white matter, most commonly indicating chronic ischemic microangiopathy. Vascular: No abnormal hyperdensity of the major intracranial arteries or dural venous sinuses. No intracranial atherosclerosis. Skull: The visualized skull base, calvarium and extracranial soft tissues are normal. Sinuses/Orbits: No fluid levels or advanced mucosal thickening  of the visualized paranasal sinuses. No mastoid or middle ear effusion. The orbits are normal. ASPECTS (Alberta Stroke Program Early CT Score) - Ganglionic level infarction (caudate, lentiform nuclei, internal capsule, insula, M1-M3 cortex): 7 - Supraganglionic infarction (M4-M6 cortex): 3 Total score (0-10 with 10 being normal): 10 CTA NECK FINDINGS SKELETON: There is no bony spinal canal stenosis. No lytic or blastic lesion. OTHER NECK: Normal pharynx, larynx and major salivary glands. No cervical lymphadenopathy. Unremarkable thyroid gland. UPPER CHEST: No pneumothorax or pleural effusion. No nodules or masses. AORTIC ARCH: There is  calcific atherosclerosis of the aortic arch. There is no aneurysm, dissection or hemodynamically significant stenosis of the visualized portion of the aorta. Conventional 3 vessel aortic branching pattern. The visualized proximal subclavian arteries are widely patent. RIGHT CAROTID SYSTEM: No dissection, occlusion or aneurysm. Mild atherosclerotic calcification at the carotid bifurcation without hemodynamically significant stenosis. LEFT CAROTID SYSTEM: No dissection, occlusion or aneurysm. Mild atherosclerotic calcification at the carotid bifurcation without hemodynamically significant stenosis. VERTEBRAL ARTERIES: Left dominant configuration. Both origins are clearly patent. There is no dissection, occlusion or flow-limiting stenosis to the skull base (V1-V3 segments). CTA HEAD FINDINGS POSTERIOR CIRCULATION: --Vertebral arteries: Mild atherosclerotic calcification of the left V4 segment. Otherwise normal. --Inferior cerebellar arteries: Normal. --Basilar artery: Normal. --Superior cerebellar arteries: Normal. --Posterior cerebral arteries (PCA): Normal. ANTERIOR CIRCULATION: --Intracranial internal carotid arteries: Normal. --Anterior cerebral arteries (ACA): Normal. Both A1 segments are present. Patent anterior communicating artery (a-comm). --Middle cerebral arteries (MCA): Normal. VENOUS SINUSES: As permitted by contrast timing, patent. ANATOMIC VARIANTS: Hypoplastic right ACA A1 segment, a common variant. Review of the MIP images confirms the above findings. IMPRESSION: 1. No emergent large vessel occlusion or high-grade stenosis of the head or neck. Aortic Atherosclerosis (ICD10-I70.0). Electronically Signed   By: Deatra Robinson M.D.   On: 07/20/2020 20:34   CT ANGIO HEAD CODE STROKE  Result Date: 07/20/2020 CLINICAL DATA:  Code stroke.  Left-sided weakness and slurred speech EXAM: CT HEAD WITHOUT CONTRAST CT ANGIOGRAPHY OF THE HEAD AND NECK TECHNIQUE: Contiguous axial images were obtained from the base  of the skull through the vertex without intravenous contrast. Multidetector CT imaging of the head and neck was performed using the standard protocol during bolus administration of intravenous contrast. Multiplanar CT image reconstructions and MIPs were obtained to evaluate the vascular anatomy. Carotid stenosis measurements (when applicable) are obtained utilizing NASCET criteria, using the distal internal carotid diameter as the denominator. CONTRAST:  50mL OMNIPAQUE IOHEXOL 350 MG/ML SOLN COMPARISON:  None. FINDINGS: CT HEAD FINDINGS Brain: There is no mass, hemorrhage or extra-axial collection. The size and configuration of the ventricles and extra-axial CSF spaces are normal. There is hypoattenuation of the periventricular white matter, most commonly indicating chronic ischemic microangiopathy. Vascular: No abnormal hyperdensity of the major intracranial arteries or dural venous sinuses. No intracranial atherosclerosis. Skull: The visualized skull base, calvarium and extracranial soft tissues are normal. Sinuses/Orbits: No fluid levels or advanced mucosal thickening of the visualized paranasal sinuses. No mastoid or middle ear effusion. The orbits are normal. ASPECTS (Alberta Stroke Program Early CT Score) - Ganglionic level infarction (caudate, lentiform nuclei, internal capsule, insula, M1-M3 cortex): 7 - Supraganglionic infarction (M4-M6 cortex): 3 Total score (0-10 with 10 being normal): 10 CTA NECK FINDINGS SKELETON: There is no bony spinal canal stenosis. No lytic or blastic lesion. OTHER NECK: Normal pharynx, larynx and major salivary glands. No cervical lymphadenopathy. Unremarkable thyroid gland. UPPER CHEST: No pneumothorax or pleural effusion. No nodules or masses. AORTIC ARCH: There is  calcific atherosclerosis of the aortic arch. There is no aneurysm, dissection or hemodynamically significant stenosis of the visualized portion of the aorta. Conventional 3 vessel aortic branching pattern. The  visualized proximal subclavian arteries are widely patent. RIGHT CAROTID SYSTEM: No dissection, occlusion or aneurysm. Mild atherosclerotic calcification at the carotid bifurcation without hemodynamically significant stenosis. LEFT CAROTID SYSTEM: No dissection, occlusion or aneurysm. Mild atherosclerotic calcification at the carotid bifurcation without hemodynamically significant stenosis. VERTEBRAL ARTERIES: Left dominant configuration. Both origins are clearly patent. There is no dissection, occlusion or flow-limiting stenosis to the skull base (V1-V3 segments). CTA HEAD FINDINGS POSTERIOR CIRCULATION: --Vertebral arteries: Mild atherosclerotic calcification of the left V4 segment. Otherwise normal. --Inferior cerebellar arteries: Normal. --Basilar artery: Normal. --Superior cerebellar arteries: Normal. --Posterior cerebral arteries (PCA): Normal. ANTERIOR CIRCULATION: --Intracranial internal carotid arteries: Normal. --Anterior cerebral arteries (ACA): Normal. Both A1 segments are present. Patent anterior communicating artery (a-comm). --Middle cerebral arteries (MCA): Normal. VENOUS SINUSES: As permitted by contrast timing, patent. ANATOMIC VARIANTS: Hypoplastic right ACA A1 segment, a common variant. Review of the MIP images confirms the above findings. IMPRESSION: 1. No emergent large vessel occlusion or high-grade stenosis of the head or neck. Aortic Atherosclerosis (ICD10-I70.0). Electronically Signed   By: Deatra RobinsonKevin  Herman M.D.   On: 07/20/2020 20:34   CT ANGIO NECK CODE STROKE  Result Date: 07/20/2020 CLINICAL DATA:  Code stroke.  Left-sided weakness and slurred speech EXAM: CT HEAD WITHOUT CONTRAST CT ANGIOGRAPHY OF THE HEAD AND NECK TECHNIQUE: Contiguous axial images were obtained from the base of the skull through the vertex without intravenous contrast. Multidetector CT imaging of the head and neck was performed using the standard protocol during bolus administration of intravenous contrast.  Multiplanar CT image reconstructions and MIPs were obtained to evaluate the vascular anatomy. Carotid stenosis measurements (when applicable) are obtained utilizing NASCET criteria, using the distal internal carotid diameter as the denominator. CONTRAST:  50mL OMNIPAQUE IOHEXOL 350 MG/ML SOLN COMPARISON:  None. FINDINGS: CT HEAD FINDINGS Brain: There is no mass, hemorrhage or extra-axial collection. The size and configuration of the ventricles and extra-axial CSF spaces are normal. There is hypoattenuation of the periventricular white matter, most commonly indicating chronic ischemic microangiopathy. Vascular: No abnormal hyperdensity of the major intracranial arteries or dural venous sinuses. No intracranial atherosclerosis. Skull: The visualized skull base, calvarium and extracranial soft tissues are normal. Sinuses/Orbits: No fluid levels or advanced mucosal thickening of the visualized paranasal sinuses. No mastoid or middle ear effusion. The orbits are normal. ASPECTS (Alberta Stroke Program Early CT Score) - Ganglionic level infarction (caudate, lentiform nuclei, internal capsule, insula, M1-M3 cortex): 7 - Supraganglionic infarction (M4-M6 cortex): 3 Total score (0-10 with 10 being normal): 10 CTA NECK FINDINGS SKELETON: There is no bony spinal canal stenosis. No lytic or blastic lesion. OTHER NECK: Normal pharynx, larynx and major salivary glands. No cervical lymphadenopathy. Unremarkable thyroid gland. UPPER CHEST: No pneumothorax or pleural effusion. No nodules or masses. AORTIC ARCH: There is calcific atherosclerosis of the aortic arch. There is no aneurysm, dissection or hemodynamically significant stenosis of the visualized portion of the aorta. Conventional 3 vessel aortic branching pattern. The visualized proximal subclavian arteries are widely patent. RIGHT CAROTID SYSTEM: No dissection, occlusion or aneurysm. Mild atherosclerotic calcification at the carotid bifurcation without hemodynamically  significant stenosis. LEFT CAROTID SYSTEM: No dissection, occlusion or aneurysm. Mild atherosclerotic calcification at the carotid bifurcation without hemodynamically significant stenosis. VERTEBRAL ARTERIES: Left dominant configuration. Both origins are clearly patent. There is no dissection, occlusion or flow-limiting stenosis to the  skull base (V1-V3 segments). CTA HEAD FINDINGS POSTERIOR CIRCULATION: --Vertebral arteries: Mild atherosclerotic calcification of the left V4 segment. Otherwise normal. --Inferior cerebellar arteries: Normal. --Basilar artery: Normal. --Superior cerebellar arteries: Normal. --Posterior cerebral arteries (PCA): Normal. ANTERIOR CIRCULATION: --Intracranial internal carotid arteries: Normal. --Anterior cerebral arteries (ACA): Normal. Both A1 segments are present. Patent anterior communicating artery (a-comm). --Middle cerebral arteries (MCA): Normal. VENOUS SINUSES: As permitted by contrast timing, patent. ANATOMIC VARIANTS: Hypoplastic right ACA A1 segment, a common variant. Review of the MIP images confirms the above findings. IMPRESSION: 1. No emergent large vessel occlusion or high-grade stenosis of the head or neck. Aortic Atherosclerosis (ICD10-I70.0). Electronically Signed   By: Deatra Robinson M.D.   On: 07/20/2020 20:34     Time Spent in minutes  30     Laverna Peace M.D on 07/21/2020 at 1:26 PM  To page go to [email or web address] - password [email or web address]

## 2020-07-22 ENCOUNTER — Encounter (HOSPITAL_COMMUNITY): Payer: Self-pay | Admitting: Internal Medicine

## 2020-07-22 ENCOUNTER — Inpatient Hospital Stay (HOSPITAL_COMMUNITY): Payer: Medicare Other

## 2020-07-22 LAB — BASIC METABOLIC PANEL
Anion gap: 9 (ref 5–15)
BUN: 8 mg/dL (ref 8–23)
CO2: 23 mmol/L (ref 22–32)
Calcium: 9.3 mg/dL (ref 8.9–10.3)
Chloride: 105 mmol/L (ref 98–111)
Creatinine, Ser: 0.66 mg/dL (ref 0.44–1.00)
GFR calc Af Amer: >60 mL/min (ref >60)
GFR calc non Af Amer: >60 mL/min (ref >60)
Glucose, Bld: 112 mg/dL — ABNORMAL HIGH (ref 70–99)
Potassium: 3.4 mmol/L — ABNORMAL LOW (ref 3.5–5.1)
Sodium: 137 mmol/L (ref 135–145)

## 2020-07-22 LAB — CBC WITH DIFFERENTIAL/PLATELET
Abs Immature Granulocytes: 0.03 10*3/uL (ref 0.00–0.07)
Basophils Absolute: 0.1 10*3/uL (ref 0.0–0.1)
Basophils Relative: 1 %
Eosinophils Absolute: 0.2 10*3/uL (ref 0.0–0.5)
Eosinophils Relative: 2 %
HCT: 41.2 % (ref 36.0–46.0)
Hemoglobin: 12.9 g/dL (ref 12.0–15.0)
Immature Granulocytes: 0 %
Lymphocytes Relative: 28 %
Lymphs Abs: 3 10*3/uL (ref 0.7–4.0)
MCH: 30.4 pg (ref 26.0–34.0)
MCHC: 31.3 g/dL (ref 30.0–36.0)
MCV: 97.2 fL (ref 80.0–100.0)
Monocytes Absolute: 0.8 10*3/uL (ref 0.1–1.0)
Monocytes Relative: 7 %
Neutro Abs: 6.7 10*3/uL (ref 1.7–7.7)
Neutrophils Relative %: 62 %
Platelets: 228 10*3/uL (ref 150–400)
RBC: 4.24 MIL/uL (ref 3.87–5.11)
RDW: 12.5 % (ref 11.5–15.5)
WBC: 10.7 10*3/uL — ABNORMAL HIGH (ref 4.0–10.5)
nRBC: 0 % (ref 0.0–0.2)

## 2020-07-22 LAB — LACTATE DEHYDROGENASE: LDH: 210 U/L — ABNORMAL HIGH (ref 98–192)

## 2020-07-22 LAB — URIC ACID: Uric Acid, Serum: 3.9 mg/dL (ref 2.5–7.1)

## 2020-07-22 LAB — PHOSPHORUS: Phosphorus: 2.4 mg/dL — ABNORMAL LOW (ref 2.5–4.6)

## 2020-07-22 LAB — MAGNESIUM: Magnesium: 2.1 mg/dL (ref 1.7–2.4)

## 2020-07-22 IMAGING — MR MR HEAD W/O CM
6 series · 48 of 48 positions shown · non-contrast
Comparison: Brain MRI [DATE].

CLINICAL DATA: 89-year-old female. BMS AXIOMATIC STROKE STUDY
PROTOCOL.

EXAM:
MRI HEAD WITHOUT CONTRAST
TECHNIQUE: Multiplanar, multiecho pulse sequences of the brain and surrounding
structures were obtained without intravenous contrast.

[Series 4: DWI · axial · 5.0mm · 0.94mm/px · z∈[-101,+52]mm · 13 of 66 slices shown]
[im 1/66]
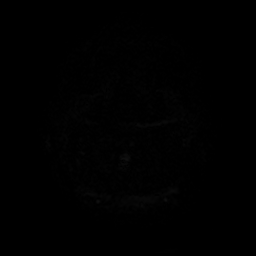
[im 6/66]
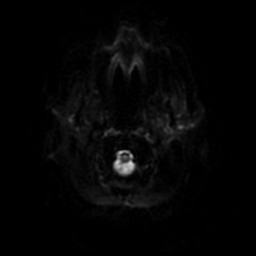
[im 11/66]
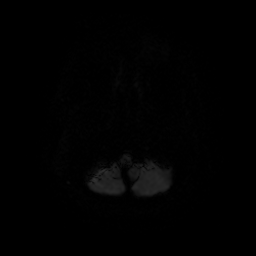
[im 17/66]
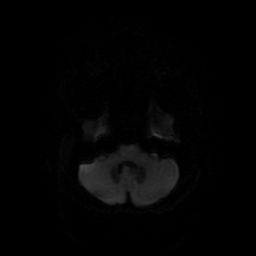
[im 22/66]
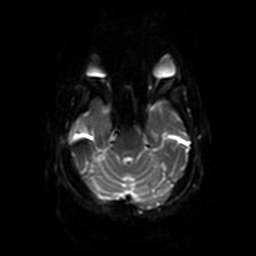
[im 28/66]
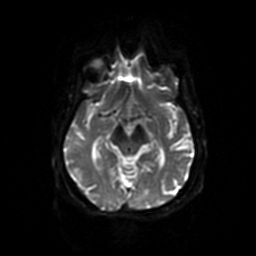
[im 33/66]
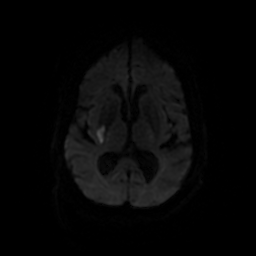
[im 38/66]
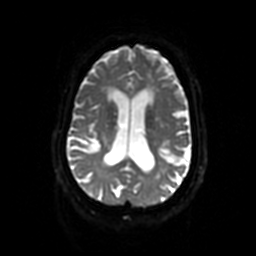
[im 44/66]
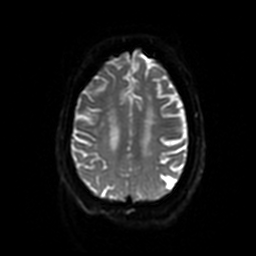
[im 49/66]
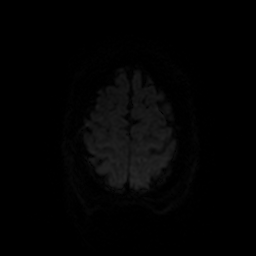
[im 55/66]
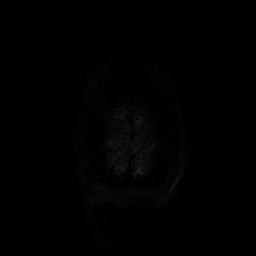
[im 60/66]
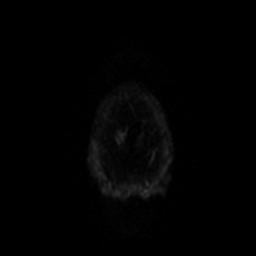
[im 66/66]
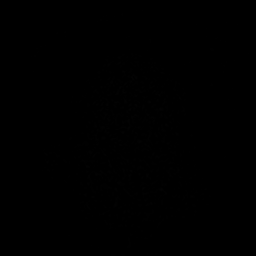

[Series 5: FLAIR · axial · 5.0mm · 0.94mm/px · z∈[-99,+49]mm · 7 of 32 slices shown]
[im 1/32]
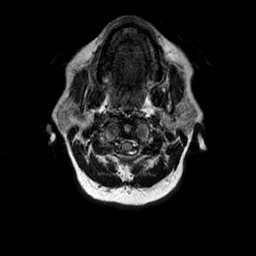
[im 6/32]
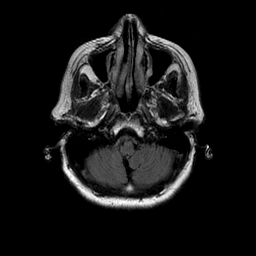
[im 11/32]
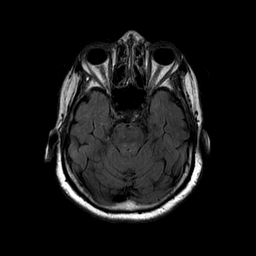
[im 16/32]
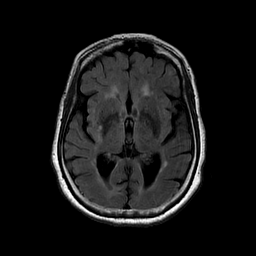
[im 21/32]
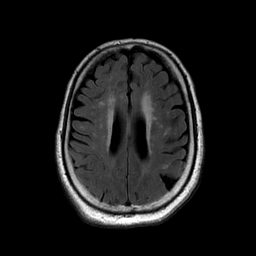
[im 26/32]
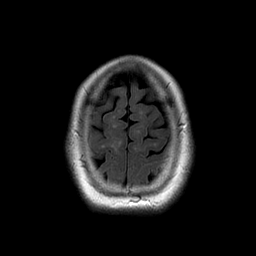
[im 32/32]
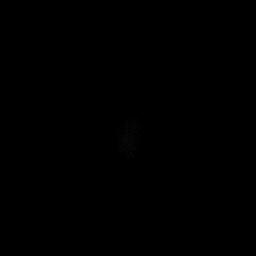

[Series 6: T2-star · axial · 5.0mm · 0.94mm/px · z∈[-99,+49]mm · 7 of 32 slices shown]
[im 1/32]
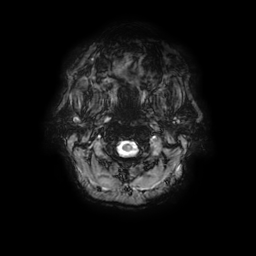
[im 6/32]
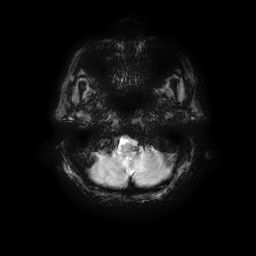
[im 11/32]
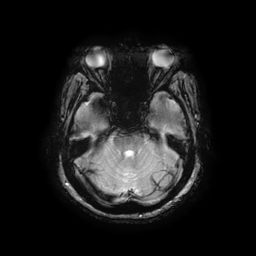
[im 16/32]
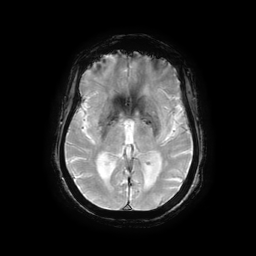
[im 21/32]
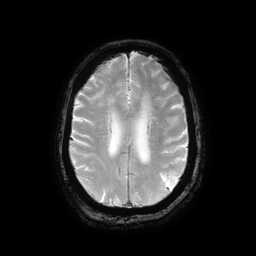
[im 26/32]
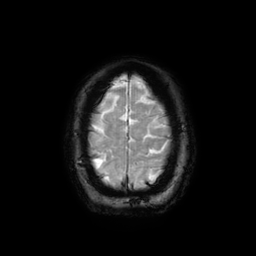
[im 32/32]
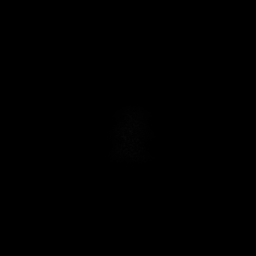

[Series 7: T1 · axial · 5.0mm · 0.94mm/px · z∈[-99,+49]mm · 7 of 32 slices shown]
[im 1/32]
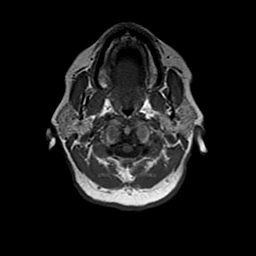
[im 6/32]
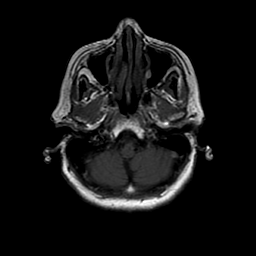
[im 11/32]
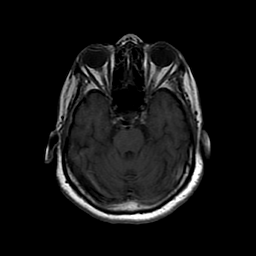
[im 16/32]
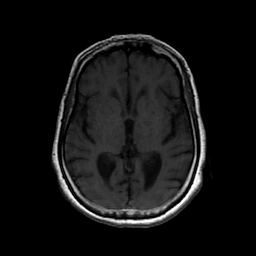
[im 21/32]
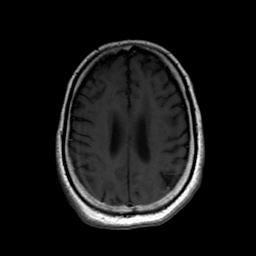
[im 26/32]
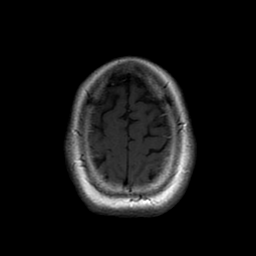
[im 32/32]
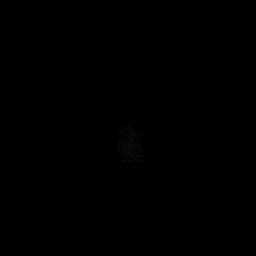

[Series 450: ADC · axial · 5.0mm · 0.94mm/px · z∈[-101,+47]mm · 7 of 32 slices shown]
[im 1/32]
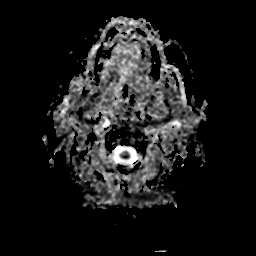
[im 6/32]
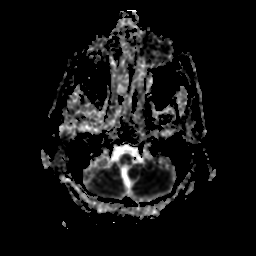
[im 11/32]
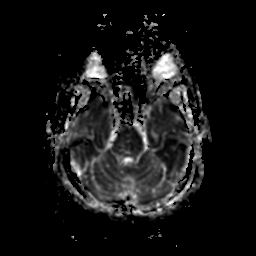
[im 16/32]
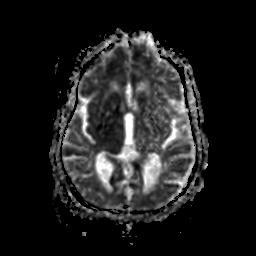
[im 21/32]
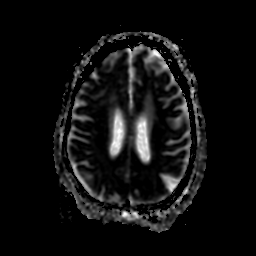
[im 26/32]
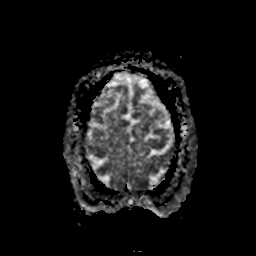
[im 32/32]
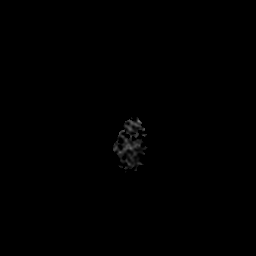

[Series 451: eadc(no-q) · axial · 5.0mm · 0.94mm/px · z∈[-101,+47]mm · 7 of 32 slices shown]
[im 1/32]
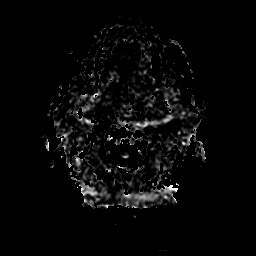
[im 6/32]
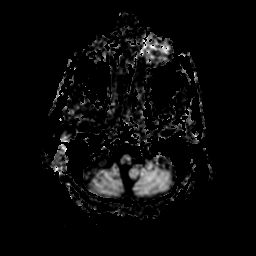
[im 11/32]
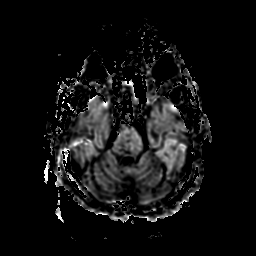
[im 16/32]
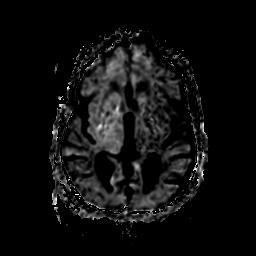
[im 21/32]
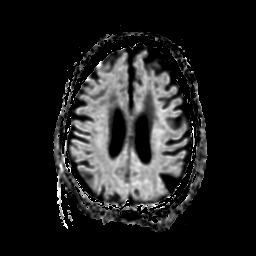
[im 26/32]
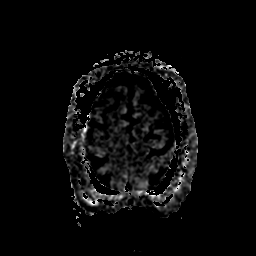
[im 32/32]
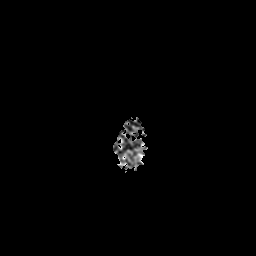

[48 of 48 positions shown; findings below may reference images not displayed]

FINDINGS: Curvilinear restricted diffusion redemonstrated from the posterior
corona radiata to the posterior right lentiform. DWI appearance not
significantly changed. No associated mass effect. No acute or
chronic blood products identified on T2 * GRE.

No other restricted diffusion. Increased FLAIR hyperintensity from 2
days ago. Elsewhere stable gray and white matter signal on axial
FLAIR and T1 weighted imaging.
IMPRESSION: Expected evolution of recent small vessel infarct affecting the
posterior right corona radiata through lentiform. No associated
hemorrhage or mass effect.

## 2020-07-22 IMAGING — MR MR HEAD W/O CM
6 series · 48 of 48 positions shown · non-contrast
Comparison: Brain MRI [DATE].

CLINICAL DATA: 89-year-old female. BMS AXIOMATIC STROKE STUDY
PROTOCOL.

EXAM:
MRI HEAD WITHOUT CONTRAST
TECHNIQUE: Multiplanar, multiecho pulse sequences of the brain and surrounding
structures were obtained without intravenous contrast.

[Series 4: DWI · axial · 5.0mm · 0.94mm/px · z∈[-101,+52]mm · 13 of 66 slices shown]
[im 1/66]
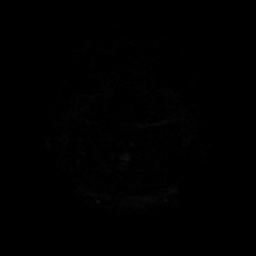
[im 6/66]
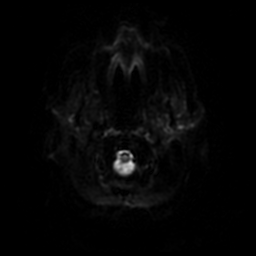
[im 11/66]
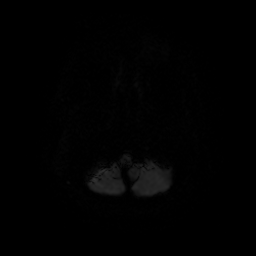
[im 17/66]
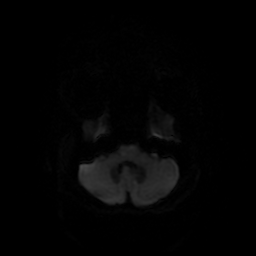
[im 22/66]
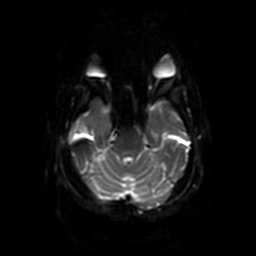
[im 28/66]
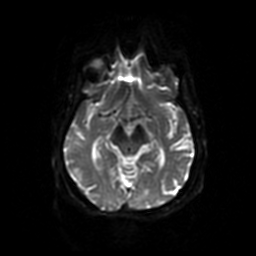
[im 33/66]
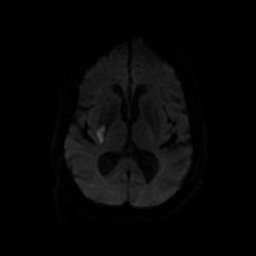
[im 38/66]
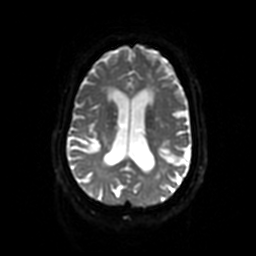
[im 44/66]
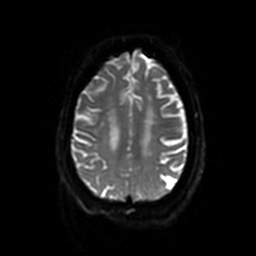
[im 49/66]
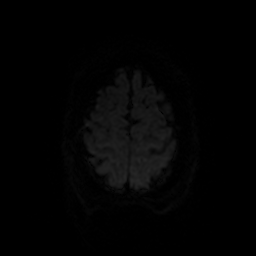
[im 55/66]
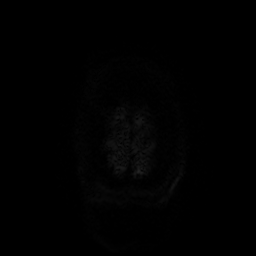
[im 60/66]
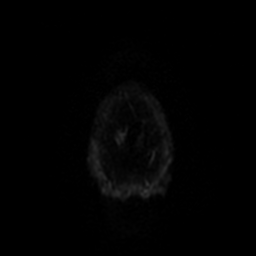
[im 66/66]
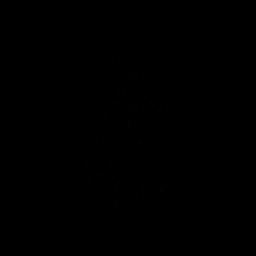

[Series 5: FLAIR · axial · 5.0mm · 0.94mm/px · z∈[-99,+49]mm · 7 of 32 slices shown]
[im 1/32]
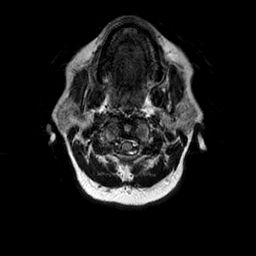
[im 6/32]
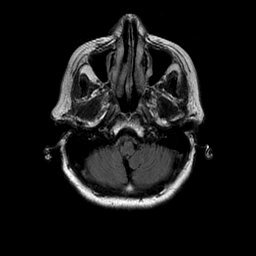
[im 11/32]
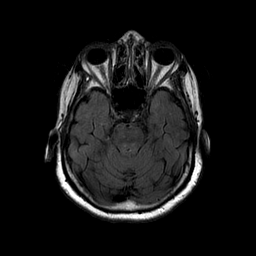
[im 16/32]
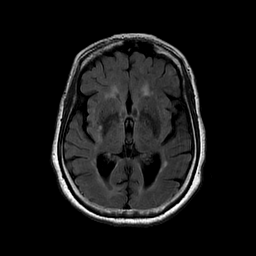
[im 21/32]
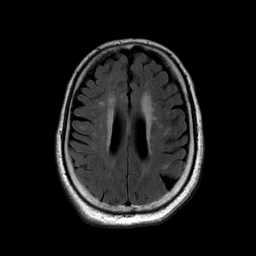
[im 26/32]
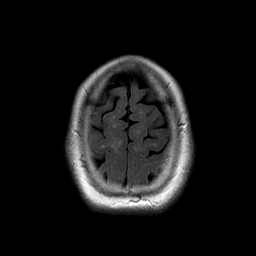
[im 32/32]
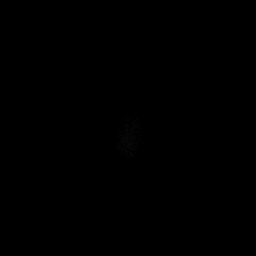

[Series 6: T2-star · axial · 5.0mm · 0.94mm/px · z∈[-99,+49]mm · 7 of 32 slices shown]
[im 1/32]
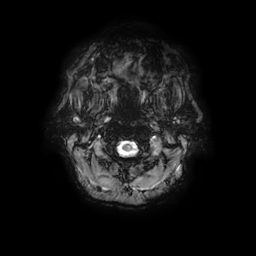
[im 6/32]
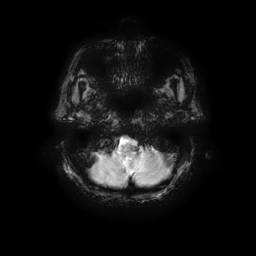
[im 11/32]
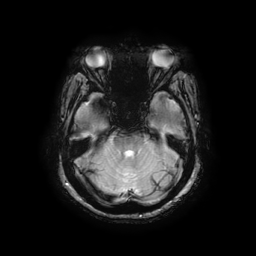
[im 16/32]
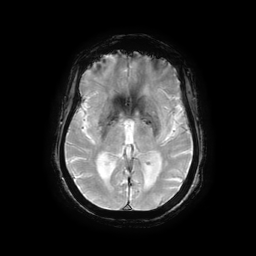
[im 21/32]
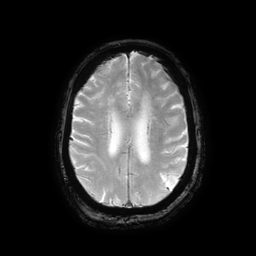
[im 26/32]
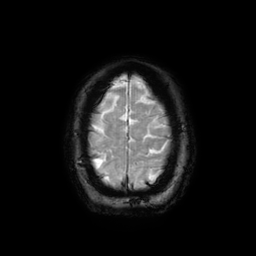
[im 32/32]
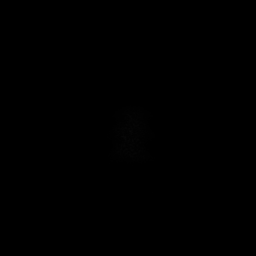

[Series 7: T1 · axial · 5.0mm · 0.94mm/px · z∈[-99,+49]mm · 7 of 32 slices shown]
[im 1/32]
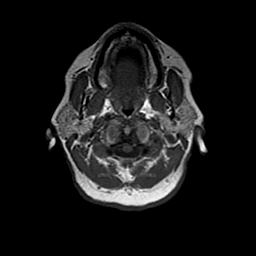
[im 6/32]
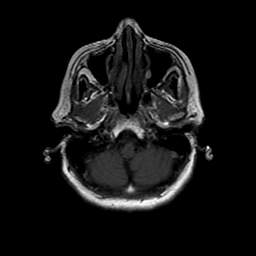
[im 11/32]
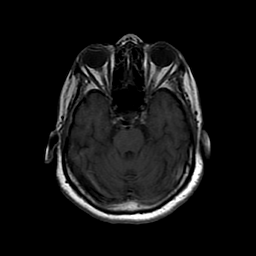
[im 16/32]
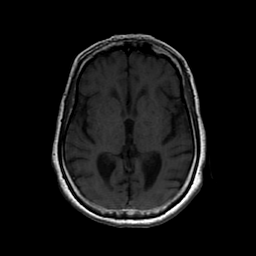
[im 21/32]
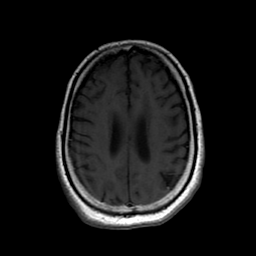
[im 26/32]
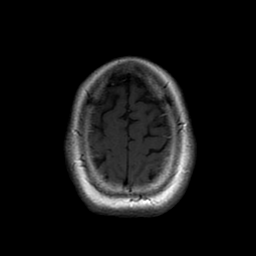
[im 32/32]
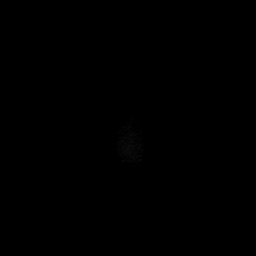

[Series 450: ADC · axial · 5.0mm · 0.94mm/px · z∈[-101,+47]mm · 7 of 32 slices shown]
[im 1/32]
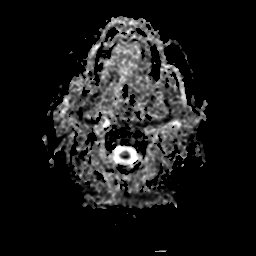
[im 6/32]
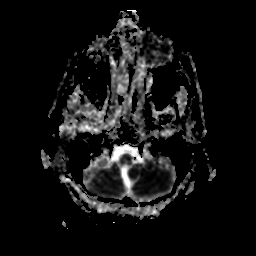
[im 11/32]
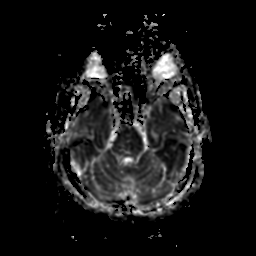
[im 16/32]
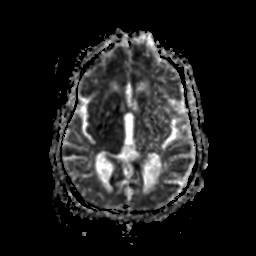
[im 21/32]
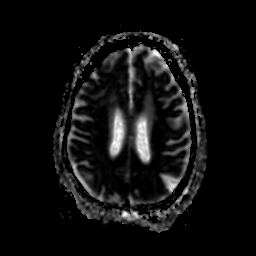
[im 26/32]
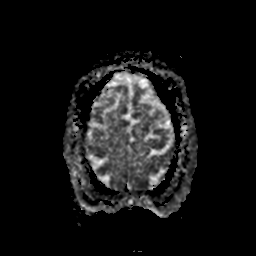
[im 32/32]
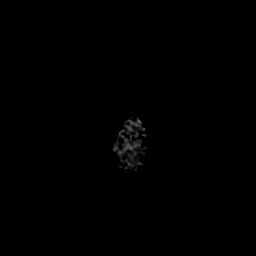

[Series 451: eadc(no-q) · axial · 5.0mm · 0.94mm/px · z∈[-101,+47]mm · 7 of 32 slices shown]
[im 1/32]
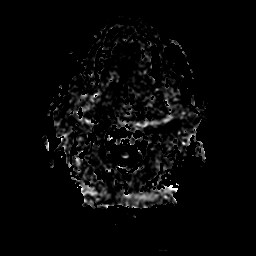
[im 6/32]
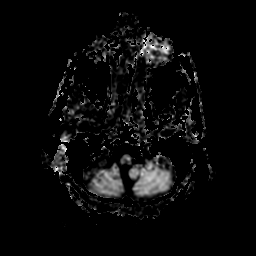
[im 11/32]
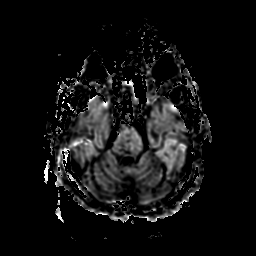
[im 16/32]
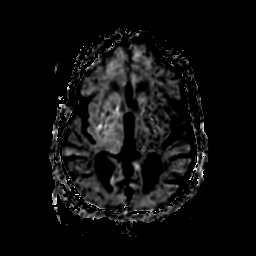
[im 21/32]
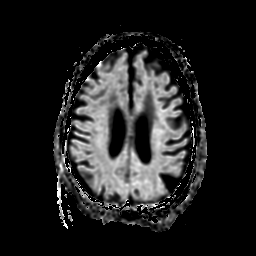
[im 26/32]
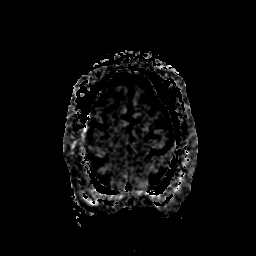
[im 32/32]
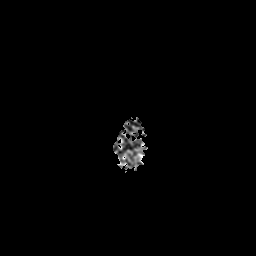

[48 of 48 positions shown; findings below may reference images not displayed]

FINDINGS: Curvilinear restricted diffusion redemonstrated from the posterior
corona radiata to the posterior right lentiform. DWI appearance not
significantly changed. No associated mass effect. No acute or
chronic blood products identified on T2 * GRE.

No other restricted diffusion. Increased FLAIR hyperintensity from 2
days ago. Elsewhere stable gray and white matter signal on axial
FLAIR and T1 weighted imaging.
IMPRESSION: Expected evolution of recent small vessel infarct affecting the
posterior right corona radiata through lentiform. No associated
hemorrhage or mass effect.

## 2020-07-22 MED ORDER — STUDY - AXIOMATIC STUDY - CLOPIDOGREL 75MG (PI-SETHI)
75.0000 mg | ORAL_TABLET | ORAL | Status: DC
  Administered 2020-07-22: 75 mg via ORAL
  Filled 2020-07-22: qty 75

## 2020-07-22 MED ORDER — STUDY - AXIOMATIC STUDY - CLOPIDOGREL 75MG (PI-SETHI)
75.0000 mg | ORAL_TABLET | ORAL | Status: DC
  Administered 2020-07-23 – 2020-07-26 (×4): 75 mg via ORAL
  Filled 2020-07-22 (×5): qty 75

## 2020-07-22 MED ORDER — STUDY - AXIOMATIC STUDY - BMS986177 OR PLACEBO (PI-SETHI)
2.0000 | ORAL_CAPSULE | Freq: Two times a day (BID) | ORAL | Status: DC
  Administered 2020-07-22 – 2020-07-26 (×8): 2 via ORAL
  Filled 2020-07-22 (×10): qty 2

## 2020-07-22 MED ORDER — SENNOSIDES-DOCUSATE SODIUM 8.6-50 MG PO TABS
1.0000 | ORAL_TABLET | Freq: Two times a day (BID) | ORAL | Status: DC
  Administered 2020-07-22 – 2020-07-24 (×5): 1 via ORAL
  Filled 2020-07-22 (×5): qty 1

## 2020-07-22 MED ORDER — STUDY - AXIOMATIC STUDY - BMS986177 OR PLACEBO (PI-SETHI)
2.0000 | ORAL_CAPSULE | Freq: Two times a day (BID) | ORAL | Status: DC
  Filled 2020-07-22: qty 2

## 2020-07-22 MED ORDER — STUDY - AXIOMATIC STUDY - CLOPIDOGREL 75MG (PI-SETHI): 75.0000 mg | ORAL_TABLET | ORAL | Status: DC

## 2020-07-22 MED ORDER — POTASSIUM CHLORIDE 20 MEQ PO PACK
40.0000 meq | PACK | ORAL | Status: AC
  Administered 2020-07-22 (×2): 40 meq via ORAL
  Filled 2020-07-22 (×3): qty 2

## 2020-07-22 MED ORDER — STUDY - AXIOMATIC STUDY - ASPIRIN 100 MG (PI-SETHI)
100.0000 mg | ORAL_TABLET | Freq: Every day | ORAL | Status: DC
  Administered 2020-07-22 – 2020-07-26 (×5): 100 mg via ORAL
  Filled 2020-07-22 (×6): qty 100

## 2020-07-22 NOTE — Progress Notes (Signed)
STROKE TEAM PROGRESS NOTE   Duplicate Done in error

## 2020-07-22 NOTE — Progress Notes (Signed)
PT Cancellation Note  Patient Details Name: Nicole [name] MRN: [id number] DOB: [birth year]   Cancelled Treatment:    Reason Eval/Treat Not Completed: Other (comment).  Has vomited this afternoon after being fully exhausted by the transition to room from ED.  Asked PT to please wait therapy, and will try to see this weekend as time and pt allow.   Ivar Drape 07/22/2020, 6:03 PM   Samul Dada, PT MS Acute Rehab Dept. Number: [facility] [phone] and [facility] [phone]

## 2020-07-22 NOTE — ED Notes (Signed)
Patients bed and purewick changed. Brief placed on patient.

## 2020-07-22 NOTE — Progress Notes (Signed)
CM met with the patient about another IR outside of [facility] since the [facility] will be full until mid next week. Pt prefers to stay at [facility]. TOC following.

## 2020-07-22 NOTE — ED Notes (Signed)
Pt's family asking for physician to call grandson, Dr. Fannie Knee, [phone], to discuss patient's care.  MD notified. Attending physician will call grandson tomorrow. Family notified.

## 2020-07-22 NOTE — Progress Notes (Signed)
Pt currently on a trial research drug for stroke.

## 2020-07-22 NOTE — Progress Notes (Signed)
[facility]  PROGRESS NOTE  Nicole HamburgerMary J Strauss RUE:[id number] DOB: 12/03/1929 DOA: 07/20/2020 PCP: [name], [name], MD Admit date - 07/20/2020   Admitting Physician Therisa DoyneAnastassia Doutova, MD  Outpatient Primary MD for the patient is [name], [name], MD  LOS - 2 Brief Narrative   Nicole HamburgerMary J [name] is a 86 y.o. year old female with medical history significant for HTN, HLD thyroidism who presented on 07/20/2020 with new left-sided weakness for 1 day and was found to have acute CVA secondary to small vessel disease.    Subjective  Today she still in the ED has no acute complaints.  Daughter is at bedside.  They have agreed to participate in stroke trial  A & P   Acute CVA with residual left-sided deficits (left upper lower extremity weakness, some slight dysarthria).  Occurred in setting of hypertension and hyperlipidemia and MRI showed small vessel infarct in the posterior right corona radiata and lentiform CTA neck shows ganglionic level infarction (caudate, lentiform nuclei, internal capsule, insula) with no signs of large vessel occlusion or high-grade stenosis.  Patient passed bedside swallow.  A1c 5.4, LDL 125.  TTE with preserved EF -Continue aspirin, Plavix x3 weeks, followed by Plavix alone -Continue high intensity atorvastatin started in the hospital -Continue monitoring on telemetry -PT recommends CIR -CIR consulted  Hypertension, not to elevated SBP's in the 140s to 150s today.  Currently allowing permissive hypertension up to 220/120 as directed by neurology -Holding home BP medications  Hypothyroidism -Continue home Synthroid  Hypokalemia mild, likely in setting of home HCTZ magnesium within normal limits -Replete potassium -Monitor BMP  Leukocytosis, suspect stress related to acute CVA.  Has no localizing symptoms of infection.  Remains afebrile.  UA unremarkable.  Chest x-ray unremarkable. -Monitor CBC    Family Communication  : Daughter updated at bedside, I also  updated her cousin Dr. Fonnie BirkenheadK. Iannaccone at [phone]  Code Status : Full  Disposition Plan  :  Patient is from home. Anticipated d/c date: 2 to 3 days. Barriers to d/c or necessity for inpatient status:  Continue work-up for stroke, PT recommends CIR Consults  : Neurology  Procedures  : TTE 9/16 DVT Prophylaxis  : Aspirin, Plavix  Lab Results  Component Value Date   PLT 228 07/22/2020    Diet :  Diet Order            Diet Heart Room service appropriate? Yes; Fluid consistency: Thin  Diet effective now                  Inpatient Medications Scheduled Meds: . atorvastatin  80 mg Oral Daily  . levothyroxine  50 mcg Oral QAC breakfast  . oxybutynin  5 mg Oral QHS  . senna-docusate  1 tablet Oral BID  . STUDY - AXIOMATIC - aspirin 100mg  (PI - Sethi)  100 mg Oral QAC breakfast  . STUDY - AXIOMATIC - [id number] or placebo (PI - Sethi)  2 capsule Oral BID  . STUDY - AXIOMATIC - clopidogrel 75 mg (PI - Sethi)  75 mg Oral Q24H   Continuous Infusions:  PRN Meds:.acetaminophen **OR** acetaminophen (TYLENOL) oral liquid 160 mg/5 mL **OR** acetaminophen, albuterol  Antibiotics  :   Anti-infectives (From admission, onward)   None       Objective   Vitals:   07/22/20 1245 07/22/20 1330 07/22/20 1345 07/22/20 1415  BP: 137/87 109/80 133/65 138/70  Pulse: 90 77 75 80  Resp: 17 19 16 14   Temp:  TempSrc:      SpO2: 98% 98% 97% 99%    SpO2: 99 %  Wt Readings from Last 3 Encounters:  07/06/15 70.8 kg     Intake/Output Summary (Last 24 hours) at 07/22/2020 1525 Last data filed at 07/22/2020 1011 Gross per 24 hour  Intake 960 ml  Output 200 ml  Net 760 ml    Physical Exam:     Awake Alert, Oriented X 3, Normal affect 4/5 strength in left upper and 3 out of 5 in left lower extremity, follows commands, no obvious facial droop, slightly slurring speech [place].AT, Normal respiratory effort on room air, CTAB RRR,No Gallops,Rubs or new Murmurs,  +ve B.Sounds, Abd Soft,  No tenderness, No rebound, guarding or rigidity. No Cyanosis, No new Rash or bruise   I have personally reviewed the following:   Data Reviewed:  CBC Recent Labs  Lab 07/20/20 2001 07/20/20 2008 07/22/20 0431  WBC 10.8*  --  10.7*  HGB 13.3 14.3 12.9  HCT 42.0 42.0 41.2  PLT 252  --  228  MCV 96.3  --  97.2  MCH 30.5  --  30.4  MCHC 31.7  --  31.3  RDW 12.2  --  12.5  LYMPHSABS 3.5  --  3.0  MONOABS 0.6  --  0.8  EOSABS 0.2  --  0.2  BASOSABS 0.1  --  0.1    Chemistries  Recent Labs  Lab 07/20/20 2001 07/20/20 2008 07/21/20 1412 07/22/20 0431 07/22/20 1213  NA 137 139  --  137  --   K 3.3* 3.3*  --  3.4*  --   CL 100 102  --  105  --   CO2 25  --   --  23  --   GLUCOSE 139* 136*  --  112*  --   BUN 16 19  --  8  --   CREATININE 0.92 0.80  --  0.66  --   CALCIUM 10.0  --   --  9.3  --   MG  --   --  2.1  --  2.1  AST 30  --   --   --   --   ALT 23  --   --   --   --   ALKPHOS 48  --   --   --   --   BILITOT 1.1  --   --   --   --    ------------------------------------------------------------------------------------------------------------------ Recent Labs    07/21/20 0330 07/21/20 1412  CHOL 198 195  HDL 52 51  LDLCALC 125* 128*  TRIG 104 80  CHOLHDL 3.8 3.8    Lab Results  Component Value Date   HGBA1C 5.4 07/21/2020   ------------------------------------------------------------------------------------------------------------------ No results for input(s): TSH, T4TOTAL, T3FREE, THYROIDAB in the last 72 hours.  Invalid input(s): FREET3 ------------------------------------------------------------------------------------------------------------------ Recent Labs    07/21/20 0330  VITAMINB12 1,114*    Coagulation profile Recent Labs  Lab 07/20/20 2001  INR 0.9    No results for input(s): DDIMER in the last 72 hours.  Cardiac Enzymes No results for input(s): CKMB, TROPONINI, MYOGLOBIN in the last 168 hours.  Invalid input(s):  CK ------------------------------------------------------------------------------------------------------------------ No results found for: BNP  Micro Results Recent Results (from the past 240 hour(s))  SARS Coronavirus 2 by RT PCR (hospital order, performed in [facility] hospital lab) Nasopharyngeal Nasopharyngeal Swab     Status: None   Collection Time: 07/21/20  2:16 AM   Specimen: Nasopharyngeal Swab  Result Value Ref Range Status   SARS Coronavirus 2 NEGATIVE NEGATIVE Final    Comment: (NOTE) SARS-CoV-2 target nucleic acids are NOT DETECTED.  The SARS-CoV-2 RNA is generally detectable in upper and lower respiratory specimens during the acute phase of infection. The lowest concentration of SARS-CoV-2 viral copies this assay can detect is 250 copies / mL. A negative result does not preclude SARS-CoV-2 infection and should not be used as the sole basis for treatment or other patient management decisions.  A negative result may occur with improper specimen collection / handling, submission of specimen other than nasopharyngeal swab, presence of viral mutation(s) within the areas targeted by this assay, and inadequate number of viral copies (<250 copies / mL). A negative result must be combined with clinical observations, patient history, and epidemiological information.  Fact Sheet for Patients:   BoilerBrush.com.cy  Fact Sheet for Healthcare Providers: https://pope.com/  This test is not yet approved or  cleared by the [place] FDA and has been authorized for detection and/or diagnosis of SARS-CoV-2 by FDA under an Emergency Use Authorization (EUA).  This EUA will remain in effect (meaning this test can be used) for the duration of the COVID-19 declaration under Section 564(b)(1) of the Act, 21 U.S.C. section 360bbb-3(b)(1), unless the authorization is terminated or revoked sooner.  Performed at [facility],  [address] [place], [place] [address]     Radiology Reports DG Chest 2 View  Result Date: 07/21/2020 CLINICAL DATA:  CVA symptoms EXAM: CHEST - 2 VIEW COMPARISON:  None. FINDINGS: Frontal and lateral views of the chest demonstrate an unremarkable cardiac silhouette. Dense atherosclerosis of the aortic arch. No airspace disease, effusion, or pneumothorax. No acute bony abnormalities. IMPRESSION: 1. No acute intrathoracic process. Electronically Signed   By: Sharlet Salina M.D.   On: 07/21/2020 02:18   MR BRAIN WO CONTRAST  Result Date: 07/20/2020 CLINICAL DATA:  86 year old female code stroke presentation, left side weakness. EXAM: MRI HEAD WITHOUT CONTRAST TECHNIQUE: Multiplanar, multiecho pulse sequences of the brain and surrounding structures were obtained without intravenous contrast. COMPARISON:  CT head, CTA head and neck earlier today. FINDINGS: Brain: Discontinuous curvilinear restricted diffusion is noted from the posterior right corona radiata tracking into the posterior right lentiform, best seen on series 7, image 53). Mild if any associated T2 and FLAIR hyperintensity. No hemorrhage or mass effect. No other restricted diffusion. Patchy and scattered bilateral cerebral white matter T2 and FLAIR hyperintensity elsewhere. No cortical encephalomalacia. No chronic cerebral blood products. Moderate T2 heterogeneity in the bilateral deep gray nuclei, most resembling lacunar infarcts in the basal ganglia. Brainstem and cerebellum are within normal limits for age. No midline shift, mass effect, evidence of mass lesion, ventriculomegaly, extra-axial collection or acute intracranial hemorrhage. Cervicomedullary junction and pituitary are within normal limits. Vascular: Major intracranial vascular flow voids are preserved. Skull and upper cervical spine: Partially visible multilevel cervical spine degeneration. Mild degenerative spinal stenosis suspected at C4-C5. Normal bone marrow signal.  Sinuses/Orbits: Postoperative changes to both globes, otherwise negative. Other: Visible internal auditory structures appear normal. Visible scalp and face appear negative. IMPRESSION: 1. Positive for acute small vessel infarct in the posterior right corona radiata and lentiform. No associated hemorrhage or mass effect. 2. Underlying chronic small vessel disease, including in the bilateral basal ganglia. 3. Mild degenerative spinal stenosis suspected at C4-C5. Electronically Signed   By: Odessa Fleming M.D.   On: 07/20/2020 22:47   ECHOCARDIOGRAM COMPLETE  Result Date: 07/21/2020  ECHOCARDIOGRAM REPORT   Patient Name:   Nicole [name] Date of Exam: 07/21/2020 Medical Rec #:  [id number]     Height:       64.0 in Accession #:    [id number]    Weight:       156.0 lb Date of Birth:  25-Jun-1930      BSA:          1.760 m Patient Age:    86 years      BP:           143/79 mmHg Patient Gender: F             HR:           81 bpm. Exam Location:  Inpatient Procedure: 2D Echo Indications:    stroke 434.91  History:        Patient has no prior history of Echocardiogram examinations.                 Risk Factors:Hypertension.  Sonographer:    Celene Skeen RDCS (AE) Referring Phys: [id number] ANASTASSIA DOUTOVA IMPRESSIONS  1. Left ventricular ejection fraction, by estimation, is 60 to 65%. The left ventricle has normal function. The left ventricle has no regional wall motion abnormalities. Left ventricular diastolic parameters are indeterminate.  2. Right ventricular systolic function is normal. The right ventricular size is mildly enlarged.  3. The mitral valve is normal in structure. Mild mitral valve regurgitation.  4. The aortic valve is abnormal. Aortic valve regurgitation is not visualized. Mild aortic valve sclerosis is present, with no evidence of aortic valve stenosis.  5. The inferior vena cava is normal in size with greater than 50% respiratory variability, suggesting right atrial pressure of 3 mmHg. FINDINGS  Left Ventricle:  Left ventricular ejection fraction, by estimation, is 60 to 65%. The left ventricle has normal function. The left ventricle has no regional wall motion abnormalities. The left ventricular internal cavity size was normal in size. There is  no left ventricular hypertrophy. Left ventricular diastolic parameters are indeterminate. Right Ventricle: The right ventricular size is mildly enlarged. No increase in right ventricular wall thickness. Right ventricular systolic function is normal. Left Atrium: Left atrial size was normal in size. Right Atrium: Right atrial size was normal in size. Pericardium: There is no evidence of pericardial effusion. Mitral Valve: The mitral valve is normal in structure. Mild mitral valve regurgitation. Tricuspid Valve: The tricuspid valve is grossly normal. Tricuspid valve regurgitation is trivial. Aortic Valve: The aortic valve is abnormal. Aortic valve regurgitation is not visualized. Mild aortic valve sclerosis is present, with no evidence of aortic valve stenosis. Pulmonic Valve: The pulmonic valve was not well visualized. Pulmonic valve regurgitation is not visualized. Aorta: The aortic root and ascending aorta are structurally normal, with no evidence of dilitation and the aortic root is normal in size and structure. Venous: The inferior vena cava is normal in size with greater than 50% respiratory variability, suggesting right atrial pressure of 3 mmHg. IAS/Shunts: The interatrial septum was not assessed.  LEFT VENTRICLE PLAX 2D LVIDd:         3.83 cm  Diastology LVIDs:         2.52 cm  LV e' medial:    6.74 cm/s LV PW:         0.81 cm  LV E/e' medial:  9.8 LV IVS:        0.88 cm  LV e' lateral:   7.72 cm/s LVOT diam:  1.90 cm  LV E/e' lateral: 8.5 LV SV:         55 LV SV Index:   31 LVOT Area:     2.84 cm  RIGHT VENTRICLE TAPSE (M-mode): 2.5 cm LEFT ATRIUM             Index       RIGHT ATRIUM           Index LA diam:        3.00 cm 1.70 cm/m  RA Area:     12.80 cm LA Vol  (A2C):   53.9 ml 30.62 ml/m RA Volume:   29.10 ml  16.53 ml/m LA Vol (A4C):   24.5 ml 13.92 ml/m LA Biplane Vol: 36.8 ml 20.91 ml/m  AORTIC VALVE LVOT Vmax:   80.30 cm/s LVOT Vmean:  61.000 cm/s LVOT VTI:    0.194 m  AORTA Ao Root diam: 2.80 cm MITRAL VALVE MV Area (PHT): 2.83 cm    SHUNTS MV Decel Time: 268 msec    Systemic VTI:  0.19 m MV E velocity: 66.00 cm/s  Systemic Diam: 1.90 cm MV A velocity: 80.60 cm/s MV E/A ratio:  0.82 Dietrich Pates MD Electronically signed by Dietrich Pates MD Signature Date/Time: 07/21/2020/3:17:28 PM    Final    CT HEAD CODE STROKE WO CONTRAST  Result Date: 07/20/2020 CLINICAL DATA:  Code stroke.  Left-sided weakness and slurred speech EXAM: CT HEAD WITHOUT CONTRAST CT ANGIOGRAPHY OF THE HEAD AND NECK TECHNIQUE: Contiguous axial images were obtained from the base of the skull through the vertex without intravenous contrast. Multidetector CT imaging of the head and neck was performed using the standard protocol during bolus administration of intravenous contrast. Multiplanar CT image reconstructions and MIPs were obtained to evaluate the vascular anatomy. Carotid stenosis measurements (when applicable) are obtained utilizing NASCET criteria, using the distal internal carotid diameter as the denominator. CONTRAST:  66mL OMNIPAQUE IOHEXOL 350 MG/ML SOLN COMPARISON:  None. FINDINGS: CT HEAD FINDINGS Brain: There is no mass, hemorrhage or extra-axial collection. The size and configuration of the ventricles and extra-axial CSF spaces are normal. There is hypoattenuation of the periventricular white matter, most commonly indicating chronic ischemic microangiopathy. Vascular: No abnormal hyperdensity of the major intracranial arteries or dural venous sinuses. No intracranial atherosclerosis. Skull: The visualized skull base, calvarium and extracranial soft tissues are normal. Sinuses/Orbits: No fluid levels or advanced mucosal thickening of the visualized paranasal sinuses. No mastoid or  middle ear effusion. The orbits are normal. ASPECTS (Alberta Stroke Program Early CT Score) - Ganglionic level infarction (caudate, lentiform nuclei, internal capsule, insula, M1-M3 cortex): 7 - Supraganglionic infarction (M4-M6 cortex): 3 Total score (0-10 with 10 being normal): 10 CTA NECK FINDINGS SKELETON: There is no bony spinal canal stenosis. No lytic or blastic lesion. OTHER NECK: Normal pharynx, larynx and major salivary glands. No cervical lymphadenopathy. Unremarkable thyroid gland. UPPER CHEST: No pneumothorax or pleural effusion. No nodules or masses. AORTIC ARCH: There is calcific atherosclerosis of the aortic arch. There is no aneurysm, dissection or hemodynamically significant stenosis of the visualized portion of the aorta. Conventional 3 vessel aortic branching pattern. The visualized proximal subclavian arteries are widely patent. RIGHT CAROTID SYSTEM: No dissection, occlusion or aneurysm. Mild atherosclerotic calcification at the carotid bifurcation without hemodynamically significant stenosis. LEFT CAROTID SYSTEM: No dissection, occlusion or aneurysm. Mild atherosclerotic calcification at the carotid bifurcation without hemodynamically significant stenosis. VERTEBRAL ARTERIES: Left dominant configuration. Both origins are clearly patent. There is no dissection, occlusion or flow-limiting stenosis  to the skull base (V1-V3 segments). CTA HEAD FINDINGS POSTERIOR CIRCULATION: --Vertebral arteries: Mild atherosclerotic calcification of the left V4 segment. Otherwise normal. --Inferior cerebellar arteries: Normal. --Basilar artery: Normal. --Superior cerebellar arteries: Normal. --Posterior cerebral arteries (PCA): Normal. ANTERIOR CIRCULATION: --Intracranial internal carotid arteries: Normal. --Anterior cerebral arteries (ACA): Normal. Both A1 segments are present. Patent anterior communicating artery (a-comm). --Middle cerebral arteries (MCA): Normal. VENOUS SINUSES: As permitted by contrast timing,  patent. ANATOMIC VARIANTS: Hypoplastic right ACA A1 segment, a common variant. Review of the MIP images confirms the above findings. IMPRESSION: 1. No emergent large vessel occlusion or high-grade stenosis of the head or neck. Aortic Atherosclerosis (ICD10-I70.0). Electronically Signed   By: Deatra Robinson M.D.   On: 07/20/2020 20:34   CT ANGIO HEAD CODE STROKE  Result Date: 07/20/2020 CLINICAL DATA:  Code stroke.  Left-sided weakness and slurred speech EXAM: CT HEAD WITHOUT CONTRAST CT ANGIOGRAPHY OF THE HEAD AND NECK TECHNIQUE: Contiguous axial images were obtained from the base of the skull through the vertex without intravenous contrast. Multidetector CT imaging of the head and neck was performed using the standard protocol during bolus administration of intravenous contrast. Multiplanar CT image reconstructions and MIPs were obtained to evaluate the vascular anatomy. Carotid stenosis measurements (when applicable) are obtained utilizing NASCET criteria, using the distal internal carotid diameter as the denominator. CONTRAST:  93mL OMNIPAQUE IOHEXOL 350 MG/ML SOLN COMPARISON:  None. FINDINGS: CT HEAD FINDINGS Brain: There is no mass, hemorrhage or extra-axial collection. The size and configuration of the ventricles and extra-axial CSF spaces are normal. There is hypoattenuation of the periventricular white matter, most commonly indicating chronic ischemic microangiopathy. Vascular: No abnormal hyperdensity of the major intracranial arteries or dural venous sinuses. No intracranial atherosclerosis. Skull: The visualized skull base, calvarium and extracranial soft tissues are normal. Sinuses/Orbits: No fluid levels or advanced mucosal thickening of the visualized paranasal sinuses. No mastoid or middle ear effusion. The orbits are normal. ASPECTS (Alberta Stroke Program Early CT Score) - Ganglionic level infarction (caudate, lentiform nuclei, internal capsule, insula, M1-M3 cortex): 7 - Supraganglionic  infarction (M4-M6 cortex): 3 Total score (0-10 with 10 being normal): 10 CTA NECK FINDINGS SKELETON: There is no bony spinal canal stenosis. No lytic or blastic lesion. OTHER NECK: Normal pharynx, larynx and major salivary glands. No cervical lymphadenopathy. Unremarkable thyroid gland. UPPER CHEST: No pneumothorax or pleural effusion. No nodules or masses. AORTIC ARCH: There is calcific atherosclerosis of the aortic arch. There is no aneurysm, dissection or hemodynamically significant stenosis of the visualized portion of the aorta. Conventional 3 vessel aortic branching pattern. The visualized proximal subclavian arteries are widely patent. RIGHT CAROTID SYSTEM: No dissection, occlusion or aneurysm. Mild atherosclerotic calcification at the carotid bifurcation without hemodynamically significant stenosis. LEFT CAROTID SYSTEM: No dissection, occlusion or aneurysm. Mild atherosclerotic calcification at the carotid bifurcation without hemodynamically significant stenosis. VERTEBRAL ARTERIES: Left dominant configuration. Both origins are clearly patent. There is no dissection, occlusion or flow-limiting stenosis to the skull base (V1-V3 segments). CTA HEAD FINDINGS POSTERIOR CIRCULATION: --Vertebral arteries: Mild atherosclerotic calcification of the left V4 segment. Otherwise normal. --Inferior cerebellar arteries: Normal. --Basilar artery: Normal. --Superior cerebellar arteries: Normal. --Posterior cerebral arteries (PCA): Normal. ANTERIOR CIRCULATION: --Intracranial internal carotid arteries: Normal. --Anterior cerebral arteries (ACA): Normal. Both A1 segments are present. Patent anterior communicating artery (a-comm). --Middle cerebral arteries (MCA): Normal. VENOUS SINUSES: As permitted by contrast timing, patent. ANATOMIC VARIANTS: Hypoplastic right ACA A1 segment, a common variant. Review of the MIP images confirms the above findings. IMPRESSION: 1.  No emergent large vessel occlusion or high-grade stenosis of  the head or neck. Aortic Atherosclerosis (ICD10-I70.0). Electronically Signed   By: Deatra Robinson M.D.   On: 07/20/2020 20:34   CT ANGIO NECK CODE STROKE  Result Date: 07/20/2020 CLINICAL DATA:  Code stroke.  Left-sided weakness and slurred speech EXAM: CT HEAD WITHOUT CONTRAST CT ANGIOGRAPHY OF THE HEAD AND NECK TECHNIQUE: Contiguous axial images were obtained from the base of the skull through the vertex without intravenous contrast. Multidetector CT imaging of the head and neck was performed using the standard protocol during bolus administration of intravenous contrast. Multiplanar CT image reconstructions and MIPs were obtained to evaluate the vascular anatomy. Carotid stenosis measurements (when applicable) are obtained utilizing NASCET criteria, using the distal internal carotid diameter as the denominator. CONTRAST:  50mL OMNIPAQUE IOHEXOL 350 MG/ML SOLN COMPARISON:  None. FINDINGS: CT HEAD FINDINGS Brain: There is no mass, hemorrhage or extra-axial collection. The size and configuration of the ventricles and extra-axial CSF spaces are normal. There is hypoattenuation of the periventricular white matter, most commonly indicating chronic ischemic microangiopathy. Vascular: No abnormal hyperdensity of the major intracranial arteries or dural venous sinuses. No intracranial atherosclerosis. Skull: The visualized skull base, calvarium and extracranial soft tissues are normal. Sinuses/Orbits: No fluid levels or advanced mucosal thickening of the visualized paranasal sinuses. No mastoid or middle ear effusion. The orbits are normal. ASPECTS (Alberta Stroke Program Early CT Score) - Ganglionic level infarction (caudate, lentiform nuclei, internal capsule, insula, M1-M3 cortex): 7 - Supraganglionic infarction (M4-M6 cortex): 3 Total score (0-10 with 10 being normal): 10 CTA NECK FINDINGS SKELETON: There is no bony spinal canal stenosis. No lytic or blastic lesion. OTHER NECK: Normal pharynx, larynx and major  salivary glands. No cervical lymphadenopathy. Unremarkable thyroid gland. UPPER CHEST: No pneumothorax or pleural effusion. No nodules or masses. AORTIC ARCH: There is calcific atherosclerosis of the aortic arch. There is no aneurysm, dissection or hemodynamically significant stenosis of the visualized portion of the aorta. Conventional 3 vessel aortic branching pattern. The visualized proximal subclavian arteries are widely patent. RIGHT CAROTID SYSTEM: No dissection, occlusion or aneurysm. Mild atherosclerotic calcification at the carotid bifurcation without hemodynamically significant stenosis. LEFT CAROTID SYSTEM: No dissection, occlusion or aneurysm. Mild atherosclerotic calcification at the carotid bifurcation without hemodynamically significant stenosis. VERTEBRAL ARTERIES: Left dominant configuration. Both origins are clearly patent. There is no dissection, occlusion or flow-limiting stenosis to the skull base (V1-V3 segments). CTA HEAD FINDINGS POSTERIOR CIRCULATION: --Vertebral arteries: Mild atherosclerotic calcification of the left V4 segment. Otherwise normal. --Inferior cerebellar arteries: Normal. --Basilar artery: Normal. --Superior cerebellar arteries: Normal. --Posterior cerebral arteries (PCA): Normal. ANTERIOR CIRCULATION: --Intracranial internal carotid arteries: Normal. --Anterior cerebral arteries (ACA): Normal. Both A1 segments are present. Patent anterior communicating artery (a-comm). --Middle cerebral arteries (MCA): Normal. VENOUS SINUSES: As permitted by contrast timing, patent. ANATOMIC VARIANTS: Hypoplastic right ACA A1 segment, a common variant. Review of the MIP images confirms the above findings. IMPRESSION: 1. No emergent large vessel occlusion or high-grade stenosis of the head or neck. Aortic Atherosclerosis (ICD10-I70.0). Electronically Signed   By: Deatra Robinson M.D.   On: 07/20/2020 20:34     Time Spent in minutes  30     Laverna Peace M.D on 07/22/2020 at 3:25  PM  To page go to [email or web address] - password [email or web address]

## 2020-07-22 NOTE — Progress Notes (Addendum)
STROKE TEAM PROGRESS NOTE   INTERVAL HISTORY Patient remains in ED room her daughter is at the bedside.  She confirms patient's history that her stroke deficits started at 2:30 PM on 07/20/2020.  I have explained to the patient and daughter the BMS Axi[employer] stroke prevention study and they have agreed to participate.  I also spoke to patient's nephew who is a physician and [place] over the phone and he also acknowledged family's interest to participate in the study Vitals:   07/22/20 1330 07/22/20 1345 07/22/20 1415 07/22/20 1526  BP: 109/80 133/65 138/70 (!) 147/70  Pulse: 77 75 80 84  Resp: 19 16 14 18   Temp:    97.9 F (36.6 C)  TempSrc:    Oral  SpO2: 98% 97% 99% 97%  Weight:    69.1 kg  Height:    5\' 5"  (1.651 m)   CBC:  Recent Labs  Lab 07/20/20 2001 07/20/20 2001 07/20/20 2008 07/22/20 0431  WBC 10.8*  --   --  10.7*  NEUTROABS 6.4  --   --  6.7  HGB 13.3   < > 14.3 12.9  HCT 42.0   < > 42.0 41.2  MCV 96.3  --   --  97.2  PLT 252  --   --  228   < > = values in this interval not displayed.   Basic Metabolic Panel:  Recent Labs  Lab 07/20/20 2001 07/20/20 2001 07/20/20 2008 07/21/20 1412 07/22/20 0431 07/22/20 1213  NA 137   < > 139  --  137  --   K 3.3*   < > 3.3*  --  3.4*  --   CL 100   < > 102  --  105  --   CO2 25  --   --   --  23  --   GLUCOSE 139*   < > 136*  --  112*  --   BUN 16   < > 19  --  8  --   CREATININE 0.92   < > 0.80  --  0.66  --   CALCIUM 10.0  --   --   --  9.3  --   MG  --   --   --  2.1  --  2.1  PHOS  --   --   --   --   --  2.4*   < > = values in this interval not displayed.   Lipid Panel:  Recent Labs  Lab 07/21/20 1412  CHOL 195  TRIG 80  HDL 51  CHOLHDL 3.8  VLDL 16  LDLCALC 07/24/20*   HgbA1c:  Recent Labs  Lab 07/21/20 0330  HGBA1C 5.4   Urine Drug Screen:  Recent Labs  Lab 07/20/20 2037  LABOPIA NONE DETECTED  COCAINSCRNUR NONE DETECTED  LABBENZ NONE DETECTED  AMPHETMU NONE DETECTED  THCU NONE DETECTED   LABBARB NONE DETECTED    Alcohol Level  Recent Labs  Lab 07/20/20 2001  ETH <10    IMAGING past 24 hours No results found.  PHYSICAL EXAM Pleasant elderly African-American lady not in distress. . Afebrile. Head is nontraumatic. Neck is supple without bruit.    Cardiac exam no murmur or gallop. Lungs are clear to auscultation. Distal pulses are well felt. Neurological Exam :  Awake alert oriented place and person.  Diminished attention, registration and recall.  Diminished hearing bilaterallySpeech is clear but is without dysarthria.  No aphasia.  Extraocular movements are full  range without nystagmus.  Blinks to threat bilaterally.  Fundi not visualized.  Left lower facial weakness.  Tongue midline motor system exam mild left upper extremity and prominent left lower extremity drift with weakness of left grip intrinsic hand muscles and diminished fine finger movements on the left and orbits right over left upper extremity.  Mild weakness of left hip flexors and ankle dorsiflexors.  Sensation is intact.  Coordination slow but accurate.  Gait not tested.  NIHSS 3 premorbid Rankin scale 0  ASSESSMENT/PLAN [name] is a 86 y.o. female with history of hypertension, hypothyroidism, hyperlipidemia presenting with L sided weakness.   Stroke:   R MCA large subcortical infarct secondary to small vessel disease source-infarct measures 1.8 cm on DWI and 1.6cm  on flair which is too large for a traditional lacunar stroke and is likely cryptogenic in my opinion  Code Stroke CT head No acute abnormality. ASPECTS 10.     CTA head & neck no ELVO. Aortic atherosclerosis.   MRI  Posterior R corona radiata and lentiform nucleus infarct. Small vessel disease. Mild spinal stenosis C4-5  2D Echo ejection fraction 6065%.  No cardiac source of embolism.  LDL 125  HgbA1c 5.4  VTE prophylaxis - SCDs   aspirin 81 mg daily prior to admission, now on aspirin 81 mg daily and clopidogrel 75 mg daily.  Continue DAPT x 3 weeks then plavix alone  Therapy recommendations:  pending   Disposition:  pending  Ilives w. Daughter)  Hypertension  Stable on the high side 170-180s . Permissive hypertension (OK if < 220/120) but gradually normalize in 5-7 days . Long-term BP goal normotensive  Hyperlipidemia  Home meds:  pravachol 40  Now on lipitor 80  LDL 125, goal < 70  Continue statin at discharge  Other Stroke Risk Factors  Advanced age  Other Active Problems  Hypothyroid on synthroid  Hospital day # 2 She presented with left-sided weakness secondary to right subcortical infarct from small vessel disease.  Recommend dual antiplatelet therapy aspirin Plavix for 3 weeks followed by aspirin alone.  Aggressive risk factor modification.  Long discussion with patient and daughter and nephew about stroke evaluation, prevention and treatment and answering questions about BMS Axi[employer]  stroke prevention study.  Family wants to participate.  Patient daughter signed consent after all her questions were answered.  No study specific procedures were done before family signed informed consent form.  Greater than 50% time during this 25-minute visit was spent on counseling and coordination of care about her stroke and discussion about prevention and treatment plan. Delia Heady, MD Medical Director [facility] Pager: [phone] 07/22/2020 4:50 PMTo contact Stroke Continuity provider, please refer to [email or web address]. After hours, contact General Neurology

## 2020-07-22 NOTE — Progress Notes (Signed)
Inpatient Rehabilitation Admissions Coordinator  Inpatient rehab consult received. I will follow up with patient on Monday for rehab assessment to assist with planning dispo.  Ottie Glazier, RN, MSN Rehab Admissions Coordinator [phone] 07/22/2020 4:19 PM

## 2020-07-22 NOTE — ED Notes (Signed)
Patient placed in hospital bed daughter remains at bedside

## 2020-07-22 NOTE — Evaluation (Signed)
Occupational Therapy Evaluation Patient Details Name: Nicole [name] MRN: [id number] DOB: Jun 22, 1930 Today's Date: 07/22/2020    History of Present Illness 86 y.o. female presenting with left sided weakness. MRI positive for acute small vessel infarct in the posterior right corona radiata and lentiform. No associated hemorrhage or mass effect. PMH significant for HTN, hypothyroidism, OA, HLD, glaucoma.   Clinical Impression   PTA, pt was living living with her daughter and was independent. Pt currently requiring Min-Mod A for UB ADLs, Max A for LB ADLs, and Mod-Max A for functional transfers. Pt presenting with decreased functional use of LUE, balance, cognition, and attention to left. Pt highly motivated to participate in therapy despite deficits. Due to pt's PLOF, high motivation, family support, and change in functional status, highly recommend dc to CIR for intensive OT to optimize functional performance, increase safety, and decrease caregiver burden.     Follow Up Recommendations  CIR    Equipment Recommendations  Other (comment) (Defer to next venue)    Recommendations for Other Services Rehab consult;PT consult;Speech consult     Precautions / Restrictions Precautions Precautions: Fall Precaution Comments: Left side weakness. Inattention to L Restrictions Weight Bearing Restrictions: No      Mobility Bed Mobility Overal bed mobility: Needs Assistance Bed Mobility: Supine to Sit     Supine to sit: Mod assist;HOB elevated     General bed mobility comments: Mod A to elevate turnk and manage LLE  Transfers Overall transfer level: Needs assistance Equipment used: Rolling walker (2 wheeled) Transfers: Sit to/from [employer] Sit to Stand: Mod assist Stand pivot transfers: Max assist       General transfer comment: Mod A to power up into standing with L knee blocked. Performing stand pivot to RW, blocking left knee and Max A to maintain balance and keep  LUE on RW    Balance Overall balance assessment: Needs assistance Sitting-balance support: No upper extremity supported;Feet supported;Single extremity supported Sitting balance-Leahy Scale: Fair Sitting balance - Comments: Initial left lateral lean. Able to correct and gain balance   Standing balance support: During functional activity;Bilateral upper extremity supported Standing balance-Leahy Scale: Poor Standing balance comment: L knee buckleing and requiring physical A to maintain standing.                            ADL either performed or assessed with clinical judgement   ADL Overall ADL's : Needs assistance/impaired Eating/Feeding: Minimal assistance;Sitting   Grooming: Minimal assistance;Sitting   Upper Body Bathing: Minimal assistance;Sitting   Lower Body Bathing: Maximal assistance;Sit to/from stand   Upper Body Dressing : Moderate assistance;Sitting   Lower Body Dressing: Maximal assistance;Sit to/from stand Lower Body Dressing Details (indicate cue type and reason): Pt highly motivated. Pt requiring Max A for donning socks. Providing hand over hand on L hand to maintain grasp strength. Pt requiring assistance to manage LLE.  Toilet Transfer: Maximal assistance;Stand-pivot (simulated to recliner) Toilet Transfer Details (indicate cue type and reason): Max A to block L knee  Toileting- Clothing Manipulation and Hygiene: Minimal assistance;Maximal assistance;Sitting/lateral lean Toileting - Clothing Manipulation Details (indicate cue type and reason): Min A for peri care with lateral lean. sit<>stand and daughter assisting to manage underwear     Functional mobility during ADLs: Maximal assistance;Rolling walker (stand pivot) General ADL Comments: Pt very motivated. Presenting with      Vision Baseline Vision/History: Wears glasses Wears Glasses: Reading only Patient Visual Report: No change from  baseline       Perception     Praxis      Pertinent  Vitals/Pain Pain Assessment: No/denies pain     Hand Dominance Right   Extremity/Trunk Assessment Upper Extremity Assessment Upper Extremity Assessment: LUE deficits/detail LUE Deficits / Details: Poor grasp strength, pinch strength, and FM/GM coorindation. Pt able to bring hand to face. Poor strength over 80*.  LUE Coordination: decreased fine motor;decreased gross motor   Lower Extremity Assessment Lower Extremity Assessment: Defer to PT evaluation   Cervical / Trunk Assessment Cervical / Trunk Assessment: Kyphotic   Communication Communication Communication: HOH   Cognition Arousal/Alertness: Awake/alert Behavior During Therapy: WFL for tasks assessed/performed Overall Cognitive Status: Impaired/Different from baseline Area of Impairment: Problem solving;Awareness;Following commands                       Following Commands: Follows one step commands with increased time;Follows multi-step commands inconsistently;Follows multi-step commands with increased time   Awareness: Emergent Problem Solving: Slow processing;Requires verbal cues;Requires tactile cues General Comments: Pt requiring increased time for processing. Pt very motivated to particiapte in ADLs and therapy. Pt also present with decreased awareness of L side.    General Comments  Daughter present throughout    Exercises     Shoulder Instructions      Home Living Family/patient expects to be discharged to:: Private residence Living Arrangements: Children (Daughter) Available Help at Discharge: Family Type of Home: House Home Access: Stairs to enter Secretary/administrator of Steps: 1 Entrance Stairs-Rails: None Home Layout: Two level;Able to live on main level with bedroom/bathroom;Bed/bath upstairs;Full bath on main level Alternate Level Stairs-Number of Steps: 14 Alternate Level Stairs-Rails: Left Bathroom Shower/Tub: Chief Strategy Officer: Standard Bathroom Accessibility: Yes    Home Equipment: None          Prior Functioning/Environment Level of Independence: Independent        Comments: Pt sleeps in upstairs bedroom and daughter sleeps downstairs. Full bath on both levels. Pt stands in shower and is independent with ADL's. She is still driving, shopping, cooking, cleaning. Pt was not using AD for mobility  PTA.        OT Problem List:        OT Treatment/Interventions: Self-care/ADL training;Therapeutic exercise;Energy conservation;DME and/or AE instruction;Therapeutic activities;Patient/family education;Balance training    OT Goals(Current goals can be found in the care plan section) Acute Rehab OT Goals Patient Stated Goal: get back independence OT Goal Formulation: With patient/family Time For Goal Achievement: 08/05/20 Potential to Achieve Goals: Good  OT Frequency: Min 2X/week   Barriers to D/C:            Co-evaluation              AM-PAC OT "6 Clicks" Daily Activity     Outcome Measure Help from another person eating meals?: A Little Help from another person taking care of personal grooming?: A Little Help from another person toileting, which includes using toliet, bedpan, or urinal?: A Lot Help from another person bathing (including washing, rinsing, drying)?: A Lot Help from another person to put on and taking off regular upper body clothing?: A Lot Help from another person to put on and taking off regular lower body clothing?: A Lot 6 Click Score: 14   End of Session Equipment Utilized During Treatment: Gait belt;Rolling walker Nurse Communication: Mobility status;Other (comment) (Would benefit from stedy to return to bed with RN)  Activity Tolerance: Patient tolerated treatment well  Patient left: in chair;with call bell/phone within reach;with chair alarm set  OT Visit Diagnosis: Unsteadiness on feet (R26.81);Other abnormalities of gait and mobility (R26.89);Muscle weakness (generalized) (M62.81);Hemiplegia and  hemiparesis Hemiplegia - Right/Left: Left Hemiplegia - dominant/non-dominant: Non-Dominant Hemiplegia - caused by: Cerebral infarction                Time: 9735-3299 OT Time Calculation (min): 36 min Charges:  OT General Charges $OT Visit: 1 Visit OT Evaluation $OT Eval Moderate Complexity: 1 Mod OT Treatments $Self Care/Home Management : 8-22 mins  Nicole [name] MSOT, OTR/L Acute Rehab Pager: [phone] Office: [phone]  Theodoro Grist Nicole [name] 07/22/2020, 5:50 PM

## 2020-07-22 NOTE — ED Notes (Addendum)
Lunch brought to patient. Patient eating.

## 2020-07-22 NOTE — ED Notes (Signed)
Study medication gvien by Butler [place]

## 2020-07-23 ENCOUNTER — Inpatient Hospital Stay (HOSPITAL_COMMUNITY): Payer: Medicare Other

## 2020-07-23 DIAGNOSIS — I63311 Cerebral infarction due to thrombosis of right middle cerebral artery: Secondary | ICD-10-CM

## 2020-07-23 LAB — BASIC METABOLIC PANEL
Anion gap: 9 (ref 5–15)
BUN: 13 mg/dL (ref 8–23)
CO2: 24 mmol/L (ref 22–32)
Calcium: 9.5 mg/dL (ref 8.9–10.3)
Chloride: 105 mmol/L (ref 98–111)
Creatinine, Ser: 0.85 mg/dL (ref 0.44–1.00)
GFR calc Af Amer: >60 mL/min (ref >60)
GFR calc non Af Amer: >60 mL/min (ref >60)
Glucose, Bld: 106 mg/dL — ABNORMAL HIGH (ref 70–99)
Potassium: 3.9 mmol/L (ref 3.5–5.1)
Sodium: 138 mmol/L (ref 135–145)

## 2020-07-23 LAB — CBC WITH DIFFERENTIAL/PLATELET
Abs Immature Granulocytes: 0.02 10*3/uL (ref 0.00–0.07)
Basophils Absolute: 0.1 10*3/uL (ref 0.0–0.1)
Basophils Relative: 1 %
Eosinophils Absolute: 0.1 10*3/uL (ref 0.0–0.5)
Eosinophils Relative: 1 %
HCT: 39.2 % (ref 36.0–46.0)
Hemoglobin: 12.5 g/dL (ref 12.0–15.0)
Immature Granulocytes: 0 %
Lymphocytes Relative: 28 %
Lymphs Abs: 2.7 10*3/uL (ref 0.7–4.0)
MCH: 30.5 pg (ref 26.0–34.0)
MCHC: 31.9 g/dL (ref 30.0–36.0)
MCV: 95.6 fL (ref 80.0–100.0)
Monocytes Absolute: 0.6 10*3/uL (ref 0.1–1.0)
Monocytes Relative: 6 %
Neutro Abs: 6 10*3/uL (ref 1.7–7.7)
Neutrophils Relative %: 64 %
Platelets: 233 10*3/uL (ref 150–400)
RBC: 4.1 MIL/uL (ref 3.87–5.11)
RDW: 12.4 % (ref 11.5–15.5)
WBC: 9.4 10*3/uL (ref 4.0–10.5)
nRBC: 0 % (ref 0.0–0.2)

## 2020-07-23 IMAGING — CT CT HEAD W/O CM
4 series · 17 of 47 positions shown, 19 images · non-contrast
Comparison: MRI head [DATE], [DATE]

CLINICAL DATA: Stroke follow-up

EXAM:
CT HEAD WITHOUT CONTRAST
TECHNIQUE: Contiguous axial images were obtained from the base of the skull
through the vertex without intravenous contrast.

[Series 3: head bone · axial · 0.39mm/px · z∈[-114,-66]mm · 4 of 71 slices shown]
[im 8/71  bone]
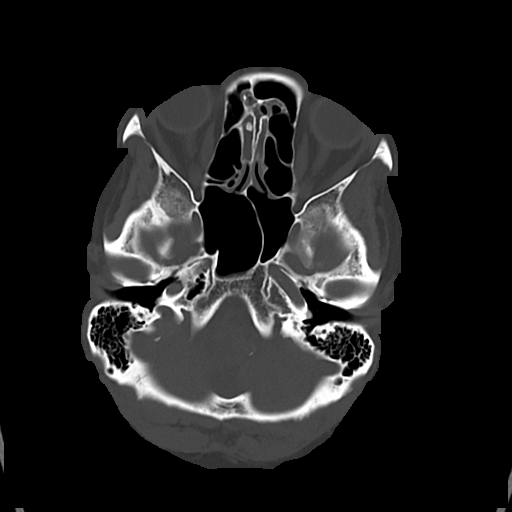
[im 15/71  bone]
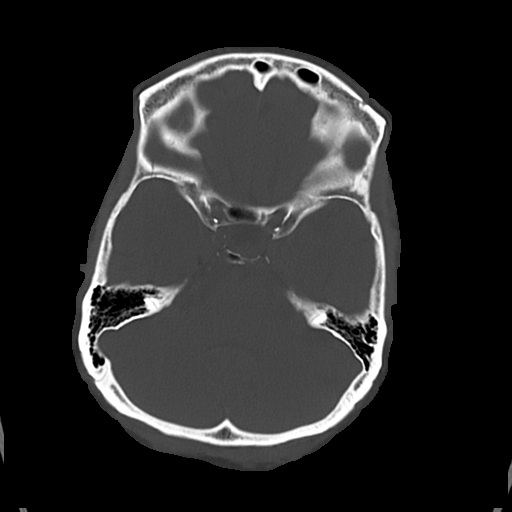
[im 22/71  bone]
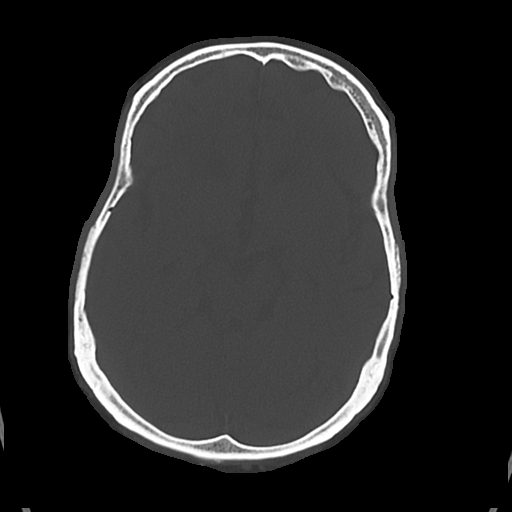
[im 32/71  bone]
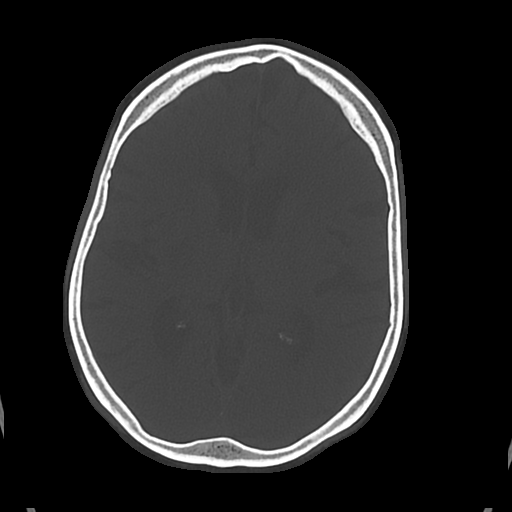

[Series 4: head wo · axial · 0.39mm/px · z∈[-112,-8]mm · 7 of 29 slices shown, 9 images]
[im 4/29  brain]
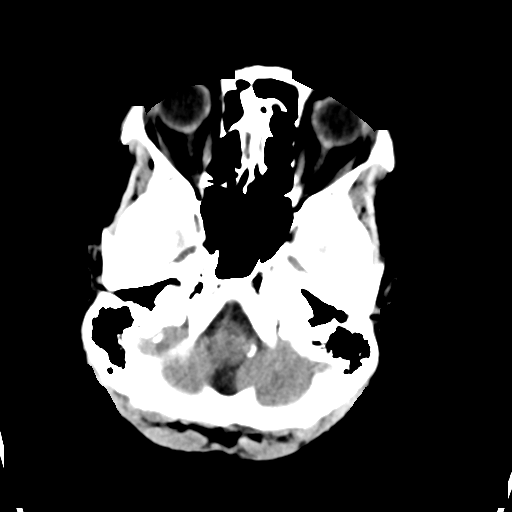
[im 4/29  bone]
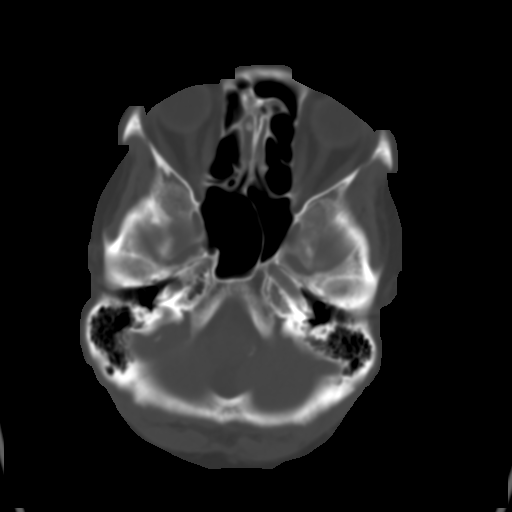
[im 8/29  brain]
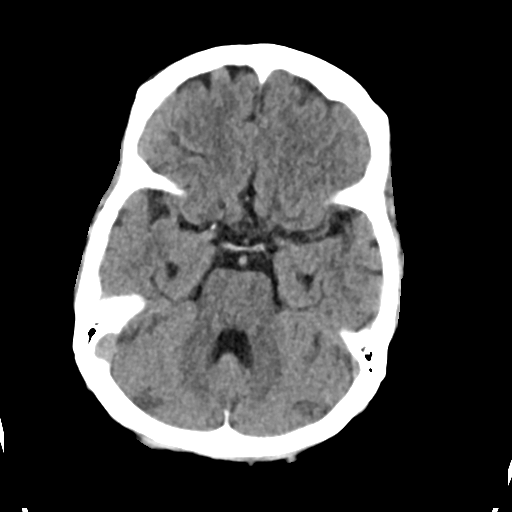
[im 11/29  brain]
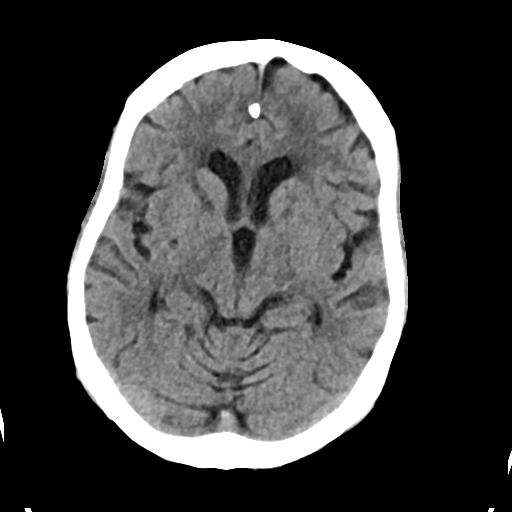
[im 15/29  brain]
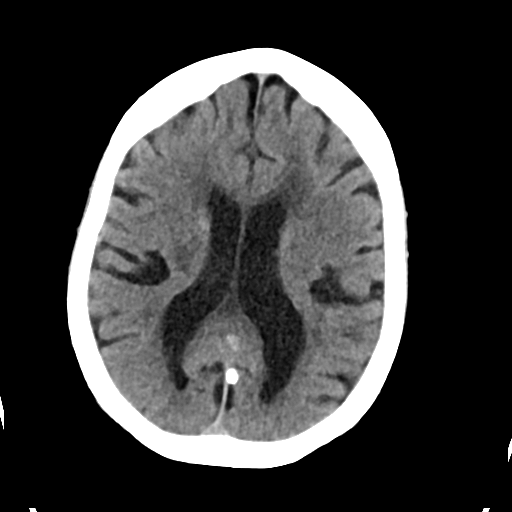
[im 18/29  brain]
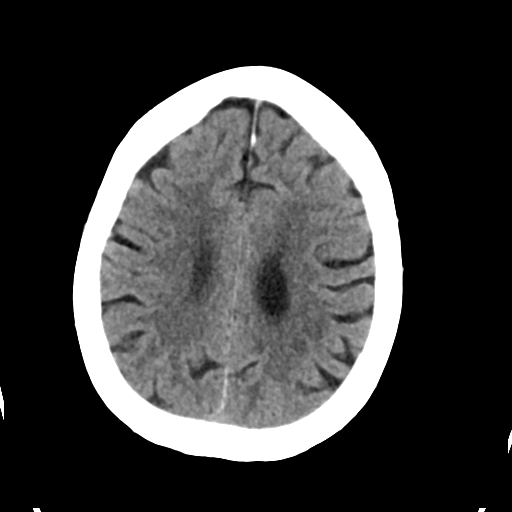
[im 18/29  bone]
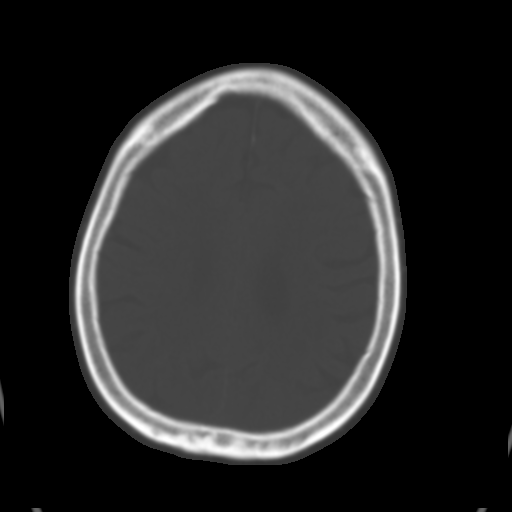
[im 22/29  brain]
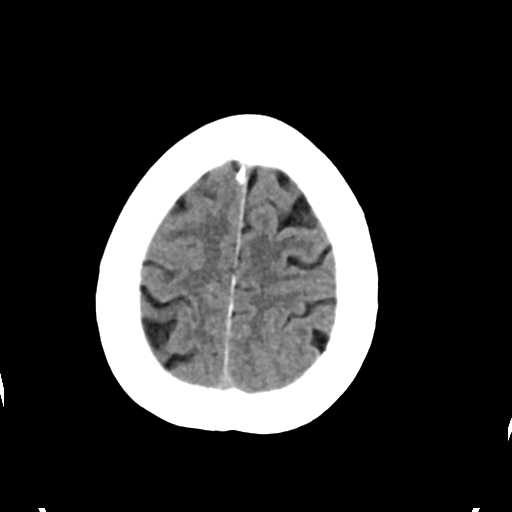
[im 25/29  brain]
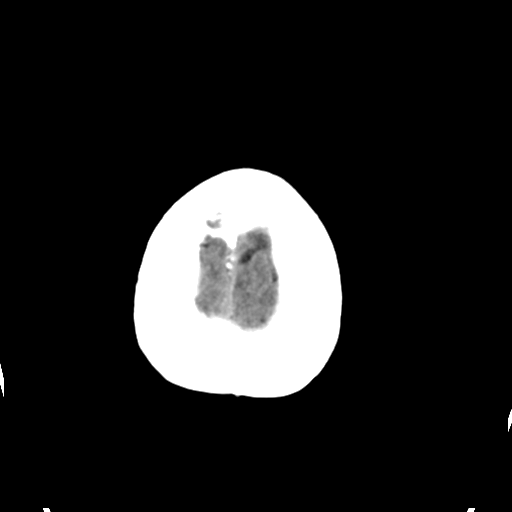

[Series 5: cor soft · coronal · 0.32mm/px · 3 of 60 slices shown]
[im 20/60  brain]
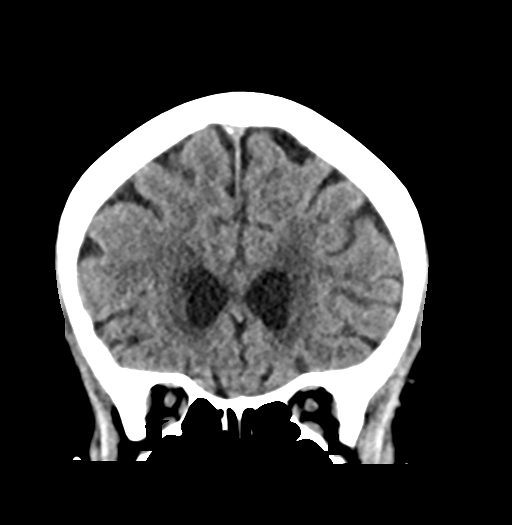
[im 27/60  brain]
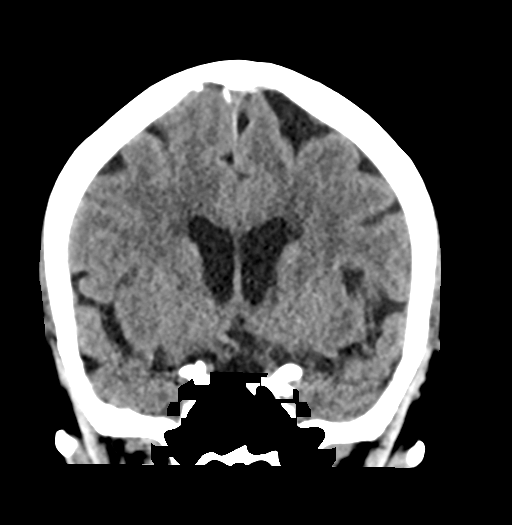
[im 33/60  brain]
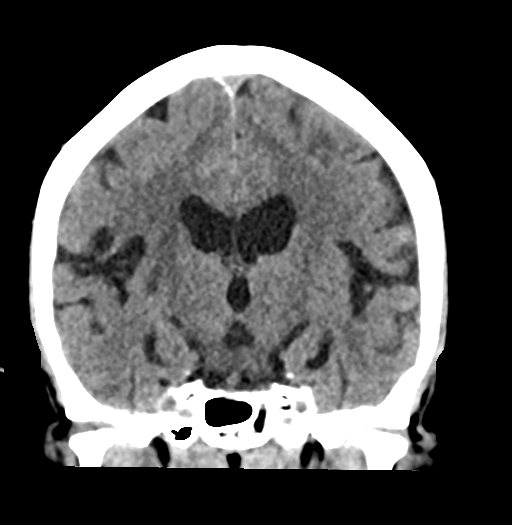

[Series 6: sag soft · sagittal · 0.32mm/px · 3 of 48 slices shown]
[im 16/48  brain]
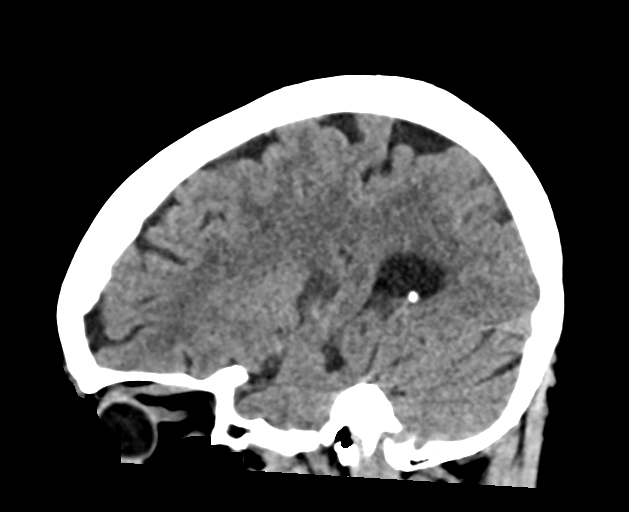
[im 24/48  brain]
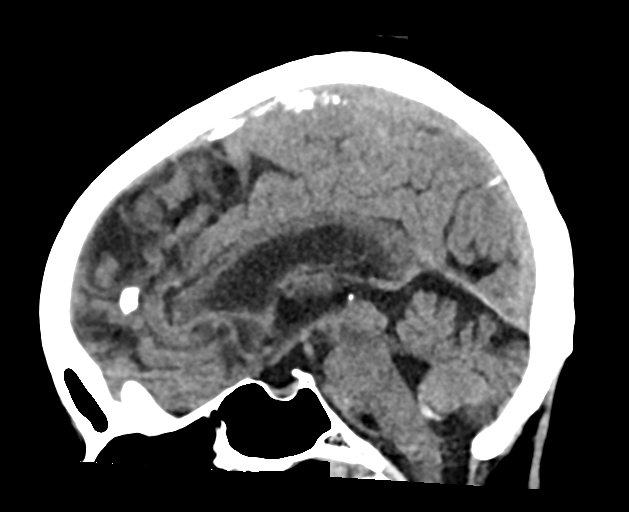
[im 32/48  brain]
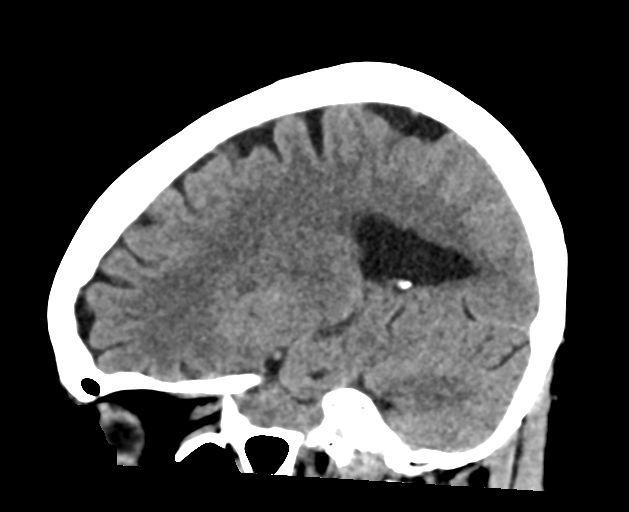

[17 of 47 positions shown; findings below may reference images not displayed]

FINDINGS: Brain: Hypodensity right posterior lentiform nucleus again noted
compatible with recent infarct. No change in size.

No new area of infarct or hemorrhage. Mild atrophy. Chronic
microvascular ischemic changes throughout the white matter. Chronic
infarct right internal capsule anteriorly. Negative for mass lesion.

Vascular: Negative for hyperdense vessel

Skull: Negative

Sinuses/Orbits: Paranasal sinuses clear. Bilateral cataract
extraction

Other: None
IMPRESSION: Acute infarct in the right lentiform nucleus unchanged from recent
MRI. No acute hemorrhage

Atrophy and chronic microvascular ischemic change in the white
matter.

## 2020-07-23 IMAGING — MR MR HEAD W/O CM
4 series · 48 of 48 positions shown · non-contrast
Comparison: Brain MRI yesterday, [DATE].

CLINICAL DATA: 89-year-old female with code stroke presentation,
left side weakness, small vessel ischemia in the posterior right
corona radiata and lentiform diagnosed on MRI [DATE].

EXAM:
LIMITED MRI HEAD WITHOUT CONTRAST
TECHNIQUE: Diffusion-weighted imaging only requested by neurology.

[Series 5: DWI · axial · 3.0mm · 0.88mm/px · z∈[-146,-10]mm · 19 of 100 slices shown (1 of 4)]
[im 1/100]
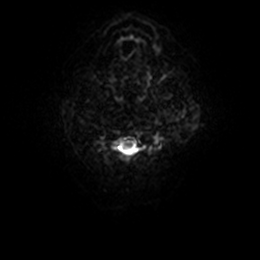
[im 6/100]
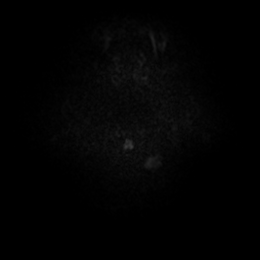
[im 12/100]
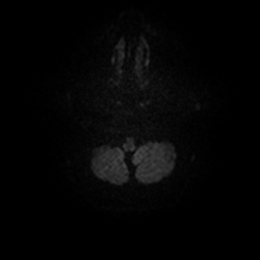
[im 17/100]
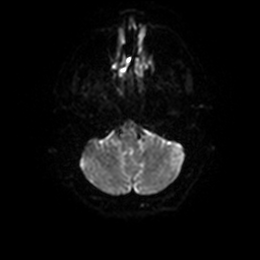
[im 23/100]
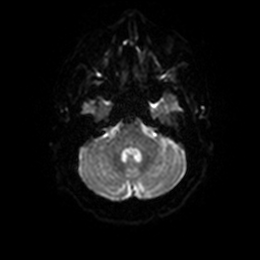
[im 28/100]
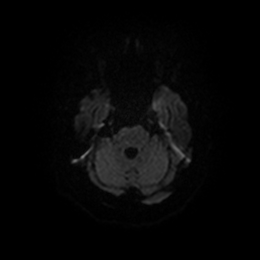
[im 34/100]
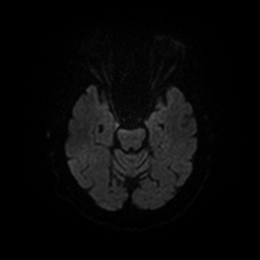
[im 39/100]
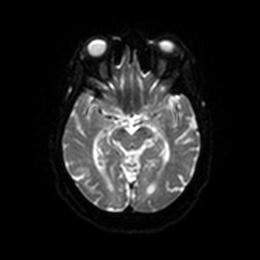
[im 45/100]
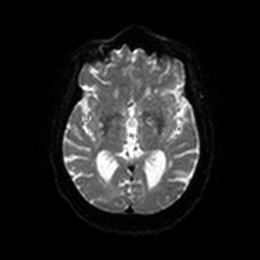
[im 50/100]
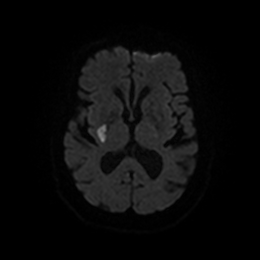
[im 56/100]
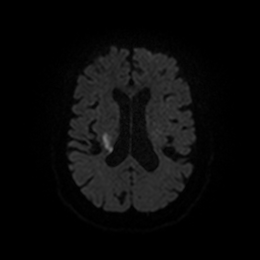
[im 61/100]
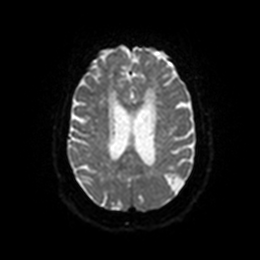
[im 67/100]
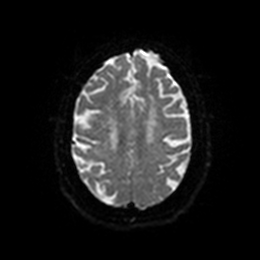
[im 72/100]
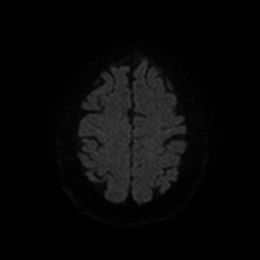
[im 78/100]
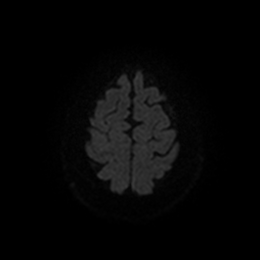
[im 83/100]
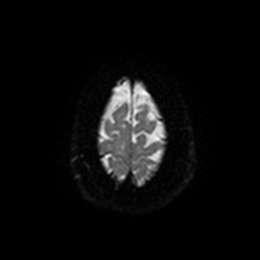
[im 89/100]
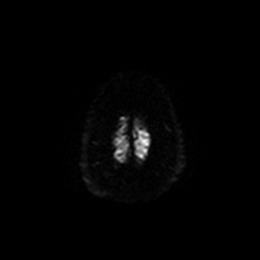
[im 94/100]
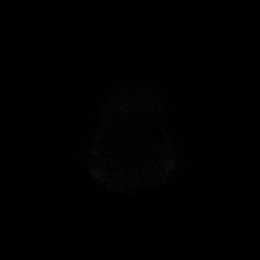
[im 100/100]
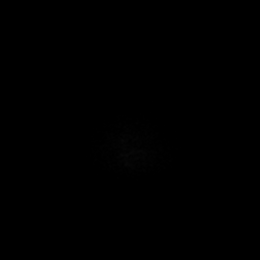

[Series 6: DWI · axial · 3.0mm · 0.88mm/px · z∈[-146,-10]mm · 9 of 50 slices shown (2 of 4)]
[im 1/50]
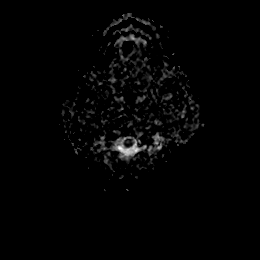
[im 7/50]
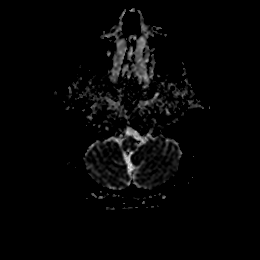
[im 13/50]
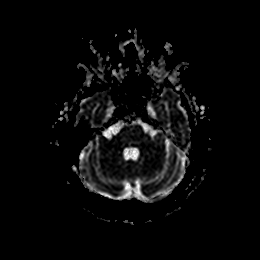
[im 19/50]
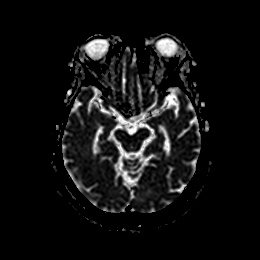
[im 25/50]
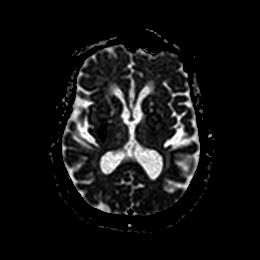
[im 31/50]
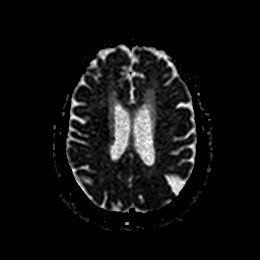
[im 37/50]
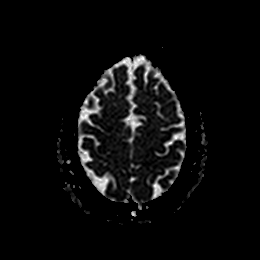
[im 43/50]
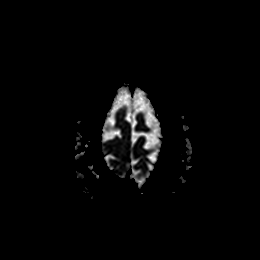
[im 50/50]
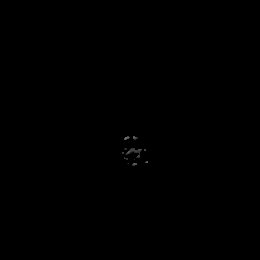

[Series 7: DWI · coronal · 4.0mm · 0.88mm/px · 13 of 72 slices shown (3 of 4)]
[im 1/72]
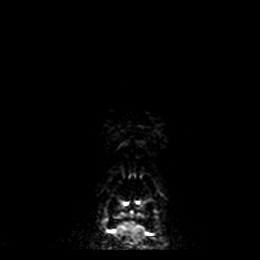
[im 6/72]
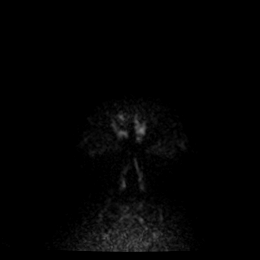
[im 12/72]
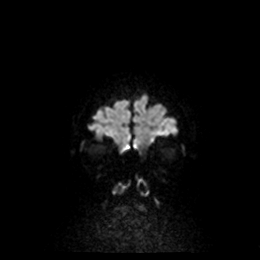
[im 18/72]
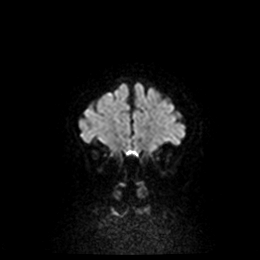
[im 24/72]
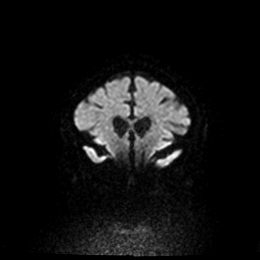
[im 30/72]
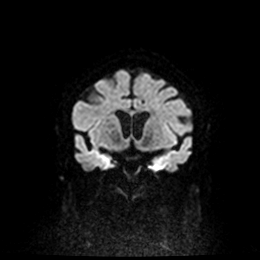
[im 36/72]
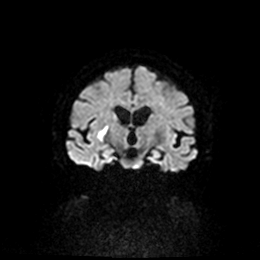
[im 42/72]
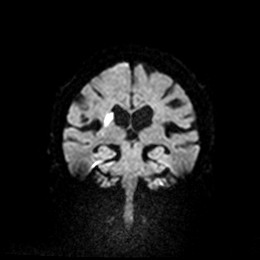
[im 48/72]
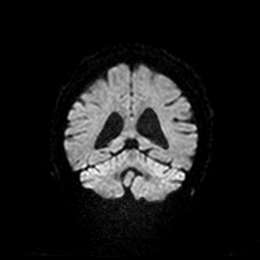
[im 54/72]
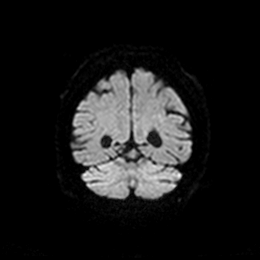
[im 60/72]
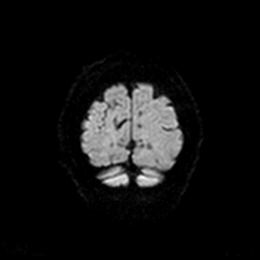
[im 66/72]
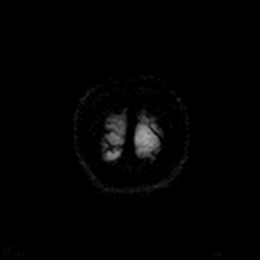
[im 72/72]
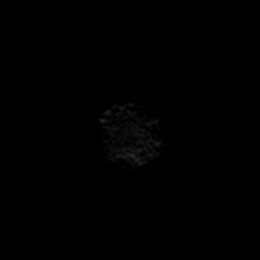

[Series 8: DWI · coronal · 4.0mm · 0.88mm/px · 7 of 36 slices shown (4 of 4)]
[im 1/36]
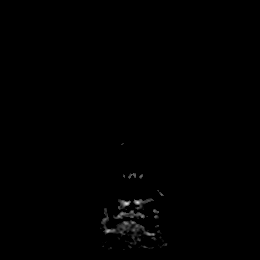
[im 6/36]
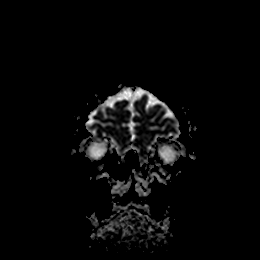
[im 12/36]
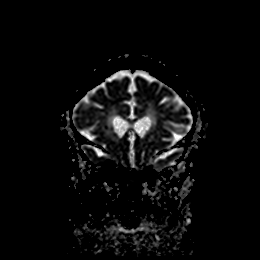
[im 18/36]
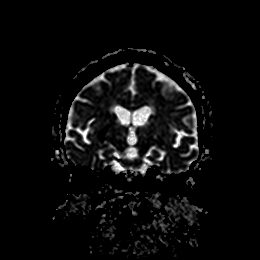
[im 24/36]
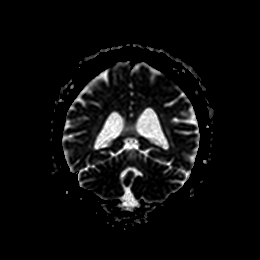
[im 30/36]
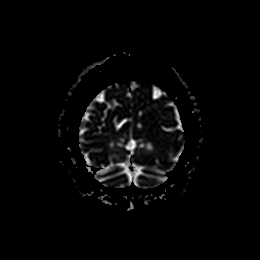
[im 36/36]
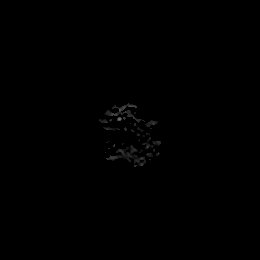

[48 of 48 positions shown; findings below may reference images not displayed]

FINDINGS: Axial and coronal DWI. Restricted diffusion has increased in
intensity since [DATE] in the affected area from the posterior
right corona radiata through the right lentiform. Compare series 7,
image 53 today to series 7, image 53 on [DATE]. The area of
involvement has mildly expanded. But there is no new restricted
diffusion elsewhere. No intracranial mass effect.
IMPRESSION: Increased intensity and mildly expanded diffusion restriction from
the right posterior corona radiata through the right lentiform since
[DATE].
No new areas of ischemia.  No intracranial mass effect.

## 2020-07-23 NOTE — Progress Notes (Addendum)
[facility]  PROGRESS NOTE  TIHANNA GOODSON YCX:[id number] DOB: February 02, 1930 DOA: 07/20/2020 PCP: Lurena Nida, MD Admit date - 07/20/2020   Admitting Physician Therisa Doyne, MD  Outpatient Primary MD for the patient is Lurena Nida, MD  LOS - 3 Brief Narrative   Nicole [name] is a 86 y.o. year old female with medical history significant for HTN, HLD thyroidism who presented on 07/20/2020 with new left-sided weakness for 1 day and was found to have acute CVA secondary to small vessel disease.    Subjective  Did well overnight.  Has some nausea and vomiting related to potassium per daughter.  Having bowel movements.  Normal appetite  A & P   Acute CVA with residual left-sided deficits (left upper lower extremity weakness, some slight dysarthria).  Occurred in setting of hypertension and hyperlipidemia and MRI showed small vessel infarct in the posterior right corona radiata and lentiform CTA neck shows ganglionic level infarction (caudate, lentiform nuclei, internal capsule, insula) with no signs of large vessel occlusion or high-grade stenosis.  Patient passed bedside swallow.  A1c 5.4, LDL 125.  TTE with preserved EF -Continue aspirin, Plavix x3 weeks, followed by Plavix alone -Continue high intensity atorvastatin started in the hospital -Continue monitoring on telemetry -PT recommends CIR -CIR consulted  ADDENDUM:spoke with daughter who reported worsening left sided weakness of patient. Neurologist has ordered repeat CT head and MRI brain to assess. On my new exam on 3: 57 pm patient how has 1/5 strength in left upper and lower extremities with minimal grip strength(different from exam this am)  Hypertension, at goal.  Currently allowing permissive hypertension up to 220/120 as directed by neurology -Holding home BP medications  Hypothyroidism -Continue home Synthroid  Hypokalemia mild, resolved likely in setting of home HCTZ magnesium within normal  limits -Repleted with potassium -Monitor BMP  Leukocytosis, resolved.  Suspect stress related to acute CVA.  Has no localizing symptoms of infection.  Remains afebrile.  UA unremarkable.  Chest x-ray unremarkable. -Monitor CBC    Family Communication  : Daughter updated at bedside Code Status : Full  Disposition Plan  :  Patient is from home. Anticipated d/c date:  Medically stable, currently awaiting ability to go to CIR barriers to d/c or necessity for inpatient status:  PT recommends CIR Consults  : Neurology  Procedures  : TTE 9/16 DVT Prophylaxis  : Aspirin, Plavix  Lab Results  Component Value Date   PLT 233 07/23/2020    Diet :  Diet Order            Diet Heart Room service appropriate? Yes; Fluid consistency: Thin  Diet effective now                  Inpatient Medications Scheduled Meds: . atorvastatin  80 mg Oral Daily  . levothyroxine  50 mcg Oral QAC breakfast  . oxybutynin  5 mg Oral QHS  . senna-docusate  1 tablet Oral BID  . STUDY - AXIOMATIC - aspirin 100mg  (PI - Sethi)  100 mg Oral QAC breakfast  . STUDY - AXIOMATIC or placebo (PI - Sethi)  2 capsule Oral BID  . STUDY - AXIOMATIC - clopidogrel 75 mg (PI - Sethi)  75 mg Oral Q24H   Continuous Infusions:  PRN Meds:.acetaminophen **OR** acetaminophen (TYLENOL) oral liquid 160 mg/5 mL **OR** acetaminophen, albuterol  Antibiotics  :   Anti-infectives (From admission, onward)   None       Objective   Vitals:  07/23/20 0047 07/23/20 0421 07/23/20 0756 07/23/20 1206  BP: (!) 144/66 (!) 139/59 128/68 128/70  Pulse: 90 84 79 80  Resp: 17 18 18 18   Temp: 99.3 F (37.4 C) 98.1 F (36.7 C) 98.4 F (36.9 C) 98 F (36.7 C)  TempSrc: Oral Oral Oral Oral  SpO2: 92% 95% 96% 97%  Weight:      Height:        SpO2: 97 %  Wt Readings from Last 3 Encounters:  07/22/20 69.1 kg  07/06/15 70.8 kg     Intake/Output Summary (Last 24 hours) at 07/23/2020 1256 Last data filed at 07/23/2020  0421 Gross per 24 hour  Intake 120 ml  Output 400 ml  Net -280 ml    Physical Exam:     Awake Alert, Oriented X 3, Normal affect 4/5 strength in left upper and 3 out of 5 in left lower extremity, follows commands, no obvious facial droop, slightly slurring speech .AT, Normal respiratory effort on room air, CTAB RRR,No Gallops,Rubs or new Murmurs,  +ve B.Sounds, Abd Soft, No tenderness, No rebound, guarding or rigidity. No Cyanosis, No new Rash or bruise   I have personally reviewed the following:   Data Reviewed:  CBC Recent Labs  Lab 07/20/20 2001 07/20/20 2008 07/22/20 0431 07/23/20 0132  WBC 10.8*  --  10.7* 9.4  HGB 13.3 14.3 12.9 12.5  HCT 42.0 42.0 41.2 39.2  PLT 252  --  228 233  MCV 96.3  --  97.2 95.6  MCH 30.5  --  30.4 30.5  MCHC 31.7  --  31.3 31.9  RDW 12.2  --  12.5 12.4  LYMPHSABS 3.5  --  3.0 2.7  MONOABS 0.6  --  0.8 0.6  EOSABS 0.2  --  0.2 0.1  BASOSABS 0.1  --  0.1 0.1    Chemistries  Recent Labs  Lab 07/20/20 2001 07/20/20 2008 07/21/20 1412 07/22/20 0431 07/22/20 1213 07/23/20 0132  NA 137 139  --  137  --  138  K 3.3* 3.3*  --  3.4*  --  3.9  CL 100 102  --  105  --  105  CO2 25  --   --  23  --  24  GLUCOSE 139* 136*  --  112*  --  106*  BUN 16 19  --  8  --  13  CREATININE 0.92 0.80  --  0.66  --  0.85  CALCIUM 10.0  --   --  9.3  --  9.5  MG  --   --  2.1  --  2.1  --   AST 30  --   --   --   --   --   ALT 23  --   --   --   --   --   ALKPHOS 48  --   --   --   --   --   BILITOT 1.1  --   --   --   --   --    ------------------------------------------------------------------------------------------------------------------ Recent Labs    07/21/20 0330 07/21/20 1412  CHOL 198 195  HDL 52 51  LDLCALC 125* 128*  TRIG 104 80  CHOLHDL 3.8 3.8    Lab Results  Component Value Date   HGBA1C 5.4 07/21/2020    ------------------------------------------------------------------------------------------------------------------ No results for input(s): TSH, T4TOTAL, T3FREE, THYROIDAB in the last 72 hours.  Invalid input(s): FREET3 ------------------------------------------------------------------------------------------------------------------ Recent Labs    07/21/20 0330  [id number] 1,114*    Coagulation profile Recent Labs  Lab 07/20/20 2001  INR 0.9    No results for input(s): DDIMER in the last 72 hours.  Cardiac Enzymes No results for input(s): CKMB, TROPONINI, MYOGLOBIN in the last 168 hours.  Invalid input(s): CK ------------------------------------------------------------------------------------------------------------------ No results found for: BNP  Micro Results Recent Results (from the past 240 hour(s))  SARS Coronavirus 2 by RT PCR (hospital order, performed in [facility] hospital lab) Nasopharyngeal Nasopharyngeal Swab     Status: None   Collection Time: 07/21/20  2:16 AM   Specimen: Nasopharyngeal Swab  Result Value Ref Range Status   SARS Coronavirus 2 NEGATIVE NEGATIVE Final    Comment: (NOTE) SARS-CoV-2 target nucleic acids are NOT DETECTED.  The SARS-CoV-2 RNA is generally detectable in upper and lower respiratory specimens during the acute phase of infection. The lowest concentration of SARS-CoV-2 viral copies this assay can detect is 250 copies / mL. A negative result does not preclude SARS-CoV-2 infection and should not be used as the sole basis for treatment or other patient management decisions.  A negative result may occur with improper specimen collection / handling, submission of specimen other than nasopharyngeal swab, presence of viral mutation(s) within the areas targeted by this assay, and inadequate number of viral copies (<250 copies / mL). A negative result must be combined with clinical observations, patient history, and epidemiological  information.  Fact Sheet for Patients:   BoilerBrush.com.cy  Fact Sheet for Healthcare Providers: https://pope.com/  This test is not yet approved or  cleared by the [place] FDA and has been authorized for detection and/or diagnosis of SARS-CoV-2 by FDA under an Emergency Use Authorization (EUA).  This EUA will remain in effect (meaning this test can be used) for the duration of the COVID-19 declaration under Section 564(b)(1) of the Act, 21 U.S.C. section 360bbb-3(b)(1), unless the authorization is terminated or revoked sooner.  Performed at [facility], [address] [place], [place] [address]     Radiology Reports DG Chest 2 View  Result Date: 07/21/2020 CLINICAL DATA:  CVA symptoms EXAM: CHEST - 2 VIEW COMPARISON:  None. FINDINGS: Frontal and lateral views of the chest demonstrate an unremarkable cardiac silhouette. Dense atherosclerosis of the aortic arch. No airspace disease, effusion, or pneumothorax. No acute bony abnormalities. IMPRESSION: 1. No acute intrathoracic process. Electronically Signed   By: Sharlet Salina M.D.   On: 07/21/2020 02:18   MR BRAIN WO CONTRAST  Result Date: 07/22/2020 CLINICAL DATA:  86 year old female. BMS AXIOMATIC STROKE STUDY PROTOCOL. EXAM: MRI HEAD WITHOUT CONTRAST TECHNIQUE: Multiplanar, multiecho pulse sequences of the brain and surrounding structures were obtained without intravenous contrast. COMPARISON:  Brain MRI 07/20/2020. FINDINGS: Curvilinear restricted diffusion redemonstrated from the posterior corona radiata to the posterior right lentiform. DWI appearance not significantly changed. No associated mass effect. No acute or chronic blood products identified on T2 * GRE. No other restricted diffusion. Increased FLAIR hyperintensity from 2 days ago. Elsewhere stable gray and white matter signal on axial FLAIR and T1 weighted imaging. IMPRESSION: Expected evolution of recent small  vessel infarct affecting the posterior right corona radiata through lentiform. No associated hemorrhage or mass effect. Electronically Signed   By: Odessa Fleming M.D.   On: 07/22/2020 21:38   MR BRAIN WO CONTRAST  Result Date: 07/20/2020 CLINICAL DATA:  86 year old female code stroke presentation, left side weakness. EXAM: MRI HEAD WITHOUT CONTRAST TECHNIQUE: Multiplanar, multiecho pulse sequences of the brain and surrounding structures were obtained without  intravenous contrast. COMPARISON:  CT head, CTA head and neck earlier today. FINDINGS: Brain: Discontinuous curvilinear restricted diffusion is noted from the posterior right corona radiata tracking into the posterior right lentiform, best seen on series 7, image 53). Mild if any associated T2 and FLAIR hyperintensity. No hemorrhage or mass effect. No other restricted diffusion. Patchy and scattered bilateral cerebral white matter T2 and FLAIR hyperintensity elsewhere. No cortical encephalomalacia. No chronic cerebral blood products. Moderate T2 heterogeneity in the bilateral deep gray nuclei, most resembling lacunar infarcts in the basal ganglia. Brainstem and cerebellum are within normal limits for age. No midline shift, mass effect, evidence of mass lesion, ventriculomegaly, extra-axial collection or acute intracranial hemorrhage. Cervicomedullary junction and pituitary are within normal limits. Vascular: Major intracranial vascular flow voids are preserved. Skull and upper cervical spine: Partially visible multilevel cervical spine degeneration. Mild degenerative spinal stenosis suspected at C4-C5. Normal bone marrow signal. Sinuses/Orbits: Postoperative changes to both globes, otherwise negative. Other: Visible internal auditory structures appear normal. Visible scalp and face appear negative. IMPRESSION: 1. Positive for acute small vessel infarct in the posterior right corona radiata and lentiform. No associated hemorrhage or mass effect. 2. Underlying  chronic small vessel disease, including in the bilateral basal ganglia. 3. Mild degenerative spinal stenosis suspected at C4-C5. Electronically Signed   By: Odessa Fleming M.D.   On: 07/20/2020 22:47   ECHOCARDIOGRAM COMPLETE  Result Date: 07/21/2020    ECHOCARDIOGRAM REPORT   Patient Name:   KATIEJO GILROY Date of Exam: 07/21/2020 Medical Rec #:  [id number]     Height:       64.0 in Accession #:    [id number]    Weight:       156.0 lb Date of Birth:  07/14/1930      BSA:          1.760 m Patient Age:    89 years      BP:           143/79 mmHg Patient Gender: F             HR:           81 bpm. Exam Location:  Inpatient Procedure: 2D Echo Indications:    stroke 434.91  History:        Patient has no prior history of Echocardiogram examinations.                 Risk Factors:Hypertension.  Sonographer:    Celene Skeen RDCS (AE) Referring Phys: [id number] ANASTASSIA DOUTOVA IMPRESSIONS  1. Left ventricular ejection fraction, by estimation, is 60 to 65%. The left ventricle has normal function. The left ventricle has no regional wall motion abnormalities. Left ventricular diastolic parameters are indeterminate.  2. Right ventricular systolic function is normal. The right ventricular size is mildly enlarged.  3. The mitral valve is normal in structure. Mild mitral valve regurgitation.  4. The aortic valve is abnormal. Aortic valve regurgitation is not visualized. Mild aortic valve sclerosis is present, with no evidence of aortic valve stenosis.  5. The inferior vena cava is normal in size with greater than 50% respiratory variability, suggesting right atrial pressure of 3 mmHg. FINDINGS  Left Ventricle: Left ventricular ejection fraction, by estimation, is 60 to 65%. The left ventricle has normal function. The left ventricle has no regional wall motion abnormalities. The left ventricular internal cavity size was normal in size. There is  no left ventricular hypertrophy. Left ventricular diastolic parameters are indeterminate. Right  Ventricle: The  right ventricular size is mildly enlarged. No increase in right ventricular wall thickness. Right ventricular systolic function is normal. Left Atrium: Left atrial size was normal in size. Right Atrium: Right atrial size was normal in size. Pericardium: There is no evidence of pericardial effusion. Mitral Valve: The mitral valve is normal in structure. Mild mitral valve regurgitation. Tricuspid Valve: The tricuspid valve is grossly normal. Tricuspid valve regurgitation is trivial. Aortic Valve: The aortic valve is abnormal. Aortic valve regurgitation is not visualized. Mild aortic valve sclerosis is present, with no evidence of aortic valve stenosis. Pulmonic Valve: The pulmonic valve was not well visualized. Pulmonic valve regurgitation is not visualized. Aorta: The aortic root and ascending aorta are structurally normal, with no evidence of dilitation and the aortic root is normal in size and structure. Venous: The inferior vena cava is normal in size with greater than 50% respiratory variability, suggesting right atrial pressure of 3 mmHg. IAS/Shunts: The interatrial septum was not assessed.  LEFT VENTRICLE PLAX 2D LVIDd:         3.83 cm  Diastology LVIDs:         2.52 cm  LV e' medial:    6.74 cm/s LV PW:         0.81 cm  LV E/e' medial:  9.8 LV IVS:        0.88 cm  LV e' lateral:   7.72 cm/s LVOT diam:     1.90 cm  LV E/e' lateral: 8.5 LV SV:         55 LV SV Index:   31 LVOT Area:     2.84 cm  RIGHT VENTRICLE TAPSE (M-mode): 2.5 cm LEFT ATRIUM             Index       RIGHT ATRIUM           Index LA diam:        3.00 cm 1.70 cm/m  RA Area:     12.80 cm LA Vol (A2C):   53.9 ml 30.62 ml/m RA Volume:   29.10 ml  16.53 ml/m LA Vol (A4C):   24.5 ml 13.92 ml/m LA Biplane Vol: 36.8 ml 20.91 ml/m  AORTIC VALVE LVOT Vmax:   80.30 cm/s LVOT Vmean:  61.000 cm/s LVOT VTI:    0.194 m  AORTA Ao Root diam: 2.80 cm MITRAL VALVE MV Area (PHT): 2.83 cm    SHUNTS MV Decel Time: 268 msec    Systemic VTI:   0.19 m MV E velocity: 66.00 cm/s  Systemic Diam: 1.90 cm MV A velocity: 80.60 cm/s MV E/A ratio:  0.82 Dietrich PatesPaula Ross MD Electronically signed by Dietrich PatesPaula Ross MD Signature Date/Time: 07/21/2020/3:17:28 PM    Final    CT HEAD CODE STROKE WO CONTRAST  Result Date: 07/20/2020 CLINICAL DATA:  Code stroke.  Left-sided weakness and slurred speech EXAM: CT HEAD WITHOUT CONTRAST CT ANGIOGRAPHY OF THE HEAD AND NECK TECHNIQUE: Contiguous axial images were obtained from the base of the skull through the vertex without intravenous contrast. Multidetector CT imaging of the head and neck was performed using the standard protocol during bolus administration of intravenous contrast. Multiplanar CT image reconstructions and MIPs were obtained to evaluate the vascular anatomy. Carotid stenosis measurements (when applicable) are obtained utilizing NASCET criteria, using the distal internal carotid diameter as the denominator. CONTRAST:  50mL OMNIPAQUE IOHEXOL 350 MG/ML SOLN COMPARISON:  None. FINDINGS: CT HEAD FINDINGS Brain: There is no mass, hemorrhage or extra-axial collection. The size and configuration  of the ventricles and extra-axial CSF spaces are normal. There is hypoattenuation of the periventricular white matter, most commonly indicating chronic ischemic microangiopathy. Vascular: No abnormal hyperdensity of the major intracranial arteries or dural venous sinuses. No intracranial atherosclerosis. Skull: The visualized skull base, calvarium and extracranial soft tissues are normal. Sinuses/Orbits: No fluid levels or advanced mucosal thickening of the visualized paranasal sinuses. No mastoid or middle ear effusion. The orbits are normal. ASPECTS ([place] Stroke Program Early CT Score) - Ganglionic level infarction (caudate, lentiform nuclei, internal capsule, insula, M1-M3 cortex): 7 - Supraganglionic infarction (M4-M6 cortex): 3 Total score (0-10 with 10 being normal): 10 CTA NECK FINDINGS SKELETON: There is no bony spinal  canal stenosis. No lytic or blastic lesion. OTHER NECK: Normal pharynx, larynx and major salivary glands. No cervical lymphadenopathy. Unremarkable thyroid gland. UPPER CHEST: No pneumothorax or pleural effusion. No nodules or masses. AORTIC ARCH: There is calcific atherosclerosis of the aortic arch. There is no aneurysm, dissection or hemodynamically significant stenosis of the visualized portion of the aorta. Conventional 3 vessel aortic branching pattern. The visualized proximal subclavian arteries are widely patent. RIGHT CAROTID SYSTEM: No dissection, occlusion or aneurysm. Mild atherosclerotic calcification at the carotid bifurcation without hemodynamically significant stenosis. LEFT CAROTID SYSTEM: No dissection, occlusion or aneurysm. Mild atherosclerotic calcification at the carotid bifurcation without hemodynamically significant stenosis. VERTEBRAL ARTERIES: Left dominant configuration. Both origins are clearly patent. There is no dissection, occlusion or flow-limiting stenosis to the skull base (V1-V3 segments). CTA HEAD FINDINGS POSTERIOR CIRCULATION: --Vertebral arteries: Mild atherosclerotic calcification of the left V4 segment. Otherwise normal. --Inferior cerebellar arteries: Normal. --Basilar artery: Normal. --Superior cerebellar arteries: Normal. --Posterior cerebral arteries (PCA): Normal. ANTERIOR CIRCULATION: --Intracranial internal carotid arteries: Normal. --Anterior cerebral arteries (ACA): Normal. Both A1 segments are present. Patent anterior communicating artery (a-comm). --Middle cerebral arteries (MCA): Normal. VENOUS SINUSES: As permitted by contrast timing, patent. ANATOMIC VARIANTS: Hypoplastic right ACA A1 segment, a common variant. Review of the MIP images confirms the above findings. IMPRESSION: 1. No emergent large vessel occlusion or high-grade stenosis of the head or neck. Aortic Atherosclerosis (ICD10-I70.0). Electronically Signed   By: Deatra Robinson M.D.   On: 07/20/2020 20:34    CT ANGIO HEAD CODE STROKE  Result Date: 07/20/2020 CLINICAL DATA:  Code stroke.  Left-sided weakness and slurred speech EXAM: CT HEAD WITHOUT CONTRAST CT ANGIOGRAPHY OF THE HEAD AND NECK TECHNIQUE: Contiguous axial images were obtained from the base of the skull through the vertex without intravenous contrast. Multidetector CT imaging of the head and neck was performed using the standard protocol during bolus administration of intravenous contrast. Multiplanar CT image reconstructions and MIPs were obtained to evaluate the vascular anatomy. Carotid stenosis measurements (when applicable) are obtained utilizing NASCET criteria, using the distal internal carotid diameter as the denominator. CONTRAST:  50mL OMNIPAQUE IOHEXOL 350 MG/ML SOLN COMPARISON:  None. FINDINGS: CT HEAD FINDINGS Brain: There is no mass, hemorrhage or extra-axial collection. The size and configuration of the ventricles and extra-axial CSF spaces are normal. There is hypoattenuation of the periventricular white matter, most commonly indicating chronic ischemic microangiopathy. Vascular: No abnormal hyperdensity of the major intracranial arteries or dural venous sinuses. No intracranial atherosclerosis. Skull: The visualized skull base, calvarium and extracranial soft tissues are normal. Sinuses/Orbits: No fluid levels or advanced mucosal thickening of the visualized paranasal sinuses. No mastoid or middle ear effusion. The orbits are normal. ASPECTS [facility] Stroke Program Early CT Score) - Ganglionic level infarction (caudate, lentiform nuclei, internal capsule, insula, M1-M3 cortex): 7 -  Supraganglionic infarction (M4-M6 cortex): 3 Total score (0-10 with 10 being normal): 10 CTA NECK FINDINGS SKELETON: There is no bony spinal canal stenosis. No lytic or blastic lesion. OTHER NECK: Normal pharynx, larynx and major salivary glands. No cervical lymphadenopathy. Unremarkable thyroid gland. UPPER CHEST: No pneumothorax or pleural effusion. No  nodules or masses. AORTIC ARCH: There is calcific atherosclerosis of the aortic arch. There is no aneurysm, dissection or hemodynamically significant stenosis of the visualized portion of the aorta. Conventional 3 vessel aortic branching pattern. The visualized proximal subclavian arteries are widely patent. RIGHT CAROTID SYSTEM: No dissection, occlusion or aneurysm. Mild atherosclerotic calcification at the carotid bifurcation without hemodynamically significant stenosis. LEFT CAROTID SYSTEM: No dissection, occlusion or aneurysm. Mild atherosclerotic calcification at the carotid bifurcation without hemodynamically significant stenosis. VERTEBRAL ARTERIES: Left dominant configuration. Both origins are clearly patent. There is no dissection, occlusion or flow-limiting stenosis to the skull base (V1-V3 segments). CTA HEAD FINDINGS POSTERIOR CIRCULATION: --Vertebral arteries: Mild atherosclerotic calcification of the left V4 segment. Otherwise normal. --Inferior cerebellar arteries: Normal. --Basilar artery: Normal. --Superior cerebellar arteries: Normal. --Posterior cerebral arteries (PCA): Normal. ANTERIOR CIRCULATION: --Intracranial internal carotid arteries: Normal. --Anterior cerebral arteries (ACA): Normal. Both A1 segments are present. Patent anterior communicating artery (a-comm). --Middle cerebral arteries (MCA): Normal. VENOUS SINUSES: As permitted by contrast timing, patent. ANATOMIC VARIANTS: Hypoplastic right ACA A1 segment, a common variant. Review of the MIP images confirms the above findings. IMPRESSION: 1. No emergent large vessel occlusion or high-grade stenosis of the head or neck. Aortic Atherosclerosis (ICD10-I70.0). Electronically Signed   By: Deatra Robinson M.D.   On: 07/20/2020 20:34   CT ANGIO NECK CODE STROKE  Result Date: 07/20/2020 CLINICAL DATA:  Code stroke.  Left-sided weakness and slurred speech EXAM: CT HEAD WITHOUT CONTRAST CT ANGIOGRAPHY OF THE HEAD AND NECK TECHNIQUE: Contiguous  axial images were obtained from the base of the skull through the vertex without intravenous contrast. Multidetector CT imaging of the head and neck was performed using the standard protocol during bolus administration of intravenous contrast. Multiplanar CT image reconstructions and MIPs were obtained to evaluate the vascular anatomy. Carotid stenosis measurements (when applicable) are obtained utilizing NASCET criteria, using the distal internal carotid diameter as the denominator. CONTRAST:  50mL OMNIPAQUE IOHEXOL 350 MG/ML SOLN COMPARISON:  None. FINDINGS: CT HEAD FINDINGS Brain: There is no mass, hemorrhage or extra-axial collection. The size and configuration of the ventricles and extra-axial CSF spaces are normal. There is hypoattenuation of the periventricular white matter, most commonly indicating chronic ischemic microangiopathy. Vascular: No abnormal hyperdensity of the major intracranial arteries or dural venous sinuses. No intracranial atherosclerosis. Skull: The visualized skull base, calvarium and extracranial soft tissues are normal. Sinuses/Orbits: No fluid levels or advanced mucosal thickening of the visualized paranasal sinuses. No mastoid or middle ear effusion. The orbits are normal. ASPECTS ([place] Stroke Program Early CT Score) - Ganglionic level infarction (caudate, lentiform nuclei, internal capsule, insula, M1-M3 cortex): 7 - Supraganglionic infarction (M4-M6 cortex): 3 Total score (0-10 with 10 being normal): 10 CTA NECK FINDINGS SKELETON: There is no bony spinal canal stenosis. No lytic or blastic lesion. OTHER NECK: Normal pharynx, larynx and major salivary glands. No cervical lymphadenopathy. Unremarkable thyroid gland. UPPER CHEST: No pneumothorax or pleural effusion. No nodules or masses. AORTIC ARCH: There is calcific atherosclerosis of the aortic arch. There is no aneurysm, dissection or hemodynamically significant stenosis of the visualized portion of the aorta. Conventional 3  vessel aortic branching pattern. The visualized proximal subclavian arteries are widely  patent. RIGHT CAROTID SYSTEM: No dissection, occlusion or aneurysm. Mild atherosclerotic calcification at the carotid bifurcation without hemodynamically significant stenosis. LEFT CAROTID SYSTEM: No dissection, occlusion or aneurysm. Mild atherosclerotic calcification at the carotid bifurcation without hemodynamically significant stenosis. VERTEBRAL ARTERIES: Left dominant configuration. Both origins are clearly patent. There is no dissection, occlusion or flow-limiting stenosis to the skull base (V1-V3 segments). CTA HEAD FINDINGS POSTERIOR CIRCULATION: --Vertebral arteries: Mild atherosclerotic calcification of the left V4 segment. Otherwise normal. --Inferior cerebellar arteries: Normal. --Basilar artery: Normal. --Superior cerebellar arteries: Normal. --Posterior cerebral arteries (PCA): Normal. ANTERIOR CIRCULATION: --Intracranial internal carotid arteries: Normal. --Anterior cerebral arteries (ACA): Normal. Both A1 segments are present. Patent anterior communicating artery (a-comm). --Middle cerebral arteries (MCA): Normal. VENOUS SINUSES: As permitted by contrast timing, patent. ANATOMIC VARIANTS: Hypoplastic right ACA A1 segment, a common variant. Review of the MIP images confirms the above findings. IMPRESSION: 1. No emergent large vessel occlusion or high-grade stenosis of the head or neck. Aortic Atherosclerosis (ICD10-I70.0). Electronically Signed   By: Deatra Robinson M.D.   On: 07/20/2020 20:34     Time Spent in minutes  30     Laverna Peace M.D on 07/23/2020 at 12:56 PM  To page go to [email or web address] - password [facility]

## 2020-07-23 NOTE — Progress Notes (Signed)
STROKE TEAM PROGRESS NOTE   INTERVAL HISTORY Daughter at bedside. Pt awake alert lying in bed, stated that her left sided weakness much worse this morning. On exam, she did have left hemiparesis, not able to against gravity, worsened from yesterday. She is on BMS trial with DAPT and investigational meds. Had taken this am. Will do CT stat to rule out bleeding, but may also need MRI for further evaluation if CT unrevealing.   Vitals:   07/22/20 1526 07/22/20 2002 07/23/20 0047 07/23/20 0421  BP: (!) 147/70 (!) 143/67 (!) 144/66 (!) 139/59  Pulse: 84 91 90 84  Resp: 18 16 17 18   Temp: 97.9 F (36.6 C) 98.4 F (36.9 C) 99.3 F (37.4 C) 98.1 F (36.7 C)  TempSrc: Oral Oral Oral Oral  SpO2: 97% 96% 92% 95%  Weight: 69.1 kg     Height: 5\' 5"  (1.651 m)      CBC:  Recent Labs  Lab 07/22/20 0431 07/23/20 0132  WBC 10.7* 9.4  NEUTROABS 6.7 6.0  HGB 12.9 12.5  HCT 41.2 39.2  MCV 97.2 95.6  PLT 228 233   Basic Metabolic Panel:  Recent Labs  Lab 07/20/20 2008 07/21/20 1412 07/22/20 0431 07/22/20 1213 07/23/20 0132  NA   < >  --  137  --  138  K   < >  --  3.4*  --  3.9  CL   < >  --  105  --  105  CO2   < >  --  23  --  24  GLUCOSE   < >  --  112*  --  106*  BUN   < >  --  8  --  13  CREATININE   < >  --  0.66  --  0.85  CALCIUM   < >  --  9.3  --  9.5  MG  --  2.1  --  2.1  --   PHOS  --   --   --  2.4*  --    < > = values in this interval not displayed.   Lipid Panel:  Recent Labs  Lab 07/21/20 1412  CHOL 195  TRIG 80  HDL 51  CHOLHDL 3.8  VLDL 16  LDLCALC 07/25/20*   HgbA1c:  Recent Labs  Lab 07/21/20 0330  HGBA1C 5.4   Urine Drug Screen:  Recent Labs  Lab 07/20/20 2037  LABOPIA NONE DETECTED  COCAINSCRNUR NONE DETECTED  LABBENZ NONE DETECTED  AMPHETMU NONE DETECTED  THCU NONE DETECTED  LABBARB NONE DETECTED    Alcohol Level  Recent Labs  Lab 07/20/20 2001  ETH <10    IMAGING past 24 hours MR BRAIN WO CONTRAST  Result Date:  07/22/2020 CLINICAL DATA:  86 year old female. BMS AXIOMATIC STROKE STUDY PROTOCOL. EXAM: MRI HEAD WITHOUT CONTRAST TECHNIQUE: Multiplanar, multiecho pulse sequences of the brain and surrounding structures were obtained without intravenous contrast. COMPARISON:  Brain MRI 07/20/2020. FINDINGS: Curvilinear restricted diffusion redemonstrated from the posterior corona radiata to the posterior right lentiform. DWI appearance not significantly changed. No associated mass effect. No acute or chronic blood products identified on T2 * GRE. No other restricted diffusion. Increased FLAIR hyperintensity from 2 days ago. Elsewhere stable gray and white matter signal on axial FLAIR and T1 weighted imaging. IMPRESSION: Expected evolution of recent small vessel infarct affecting the posterior right corona radiata through lentiform. No associated hemorrhage or mass effect. Electronically Signed   By: 98.D.  On: 07/22/2020 21:38    PHYSICAL EXAM  Temp:  [97.9 F (36.6 C)-99.3 F (37.4 C)] 98.4 F (36.9 C) (09/18 0756) Pulse Rate:  [75-91] 79 (09/18 0756) Resp:  [14-19] 18 (09/18 0756) BP: (109-147)/(59-87) 128/68 (09/18 0756) SpO2:  [92 %-99 %] 96 % (09/18 0756) Weight:  [69.1 kg] 69.1 kg (09/17 1526)  General - Well nourished, well developed, in no apparent distress.  Ophthalmologic - fundi not visualized due to noncooperation.  Cardiovascular - Regular rhythm and rate.  Mental Status -  Level of arousal and orientation to time, place, and person were intact. Language including expression, naming, repetition, comprehension was assessed and found intact. Fund of Knowledge was assessed and was intact.  Cranial Nerves II - XII - II - Visual field intact OU. III, IV, VI - Extraocular movements intact. V - Facial sensation intact bilaterally. VII - mild left nasolabial fold flattening. VIII - Hearing & vestibular intact bilaterally. X - Palate elevates symmetrically. XI - Chin turning & shoulder  shrug intact bilaterally. XII - Tongue protrusion intact.  Motor Strength - The patient's strength was normal in right upper and lower extremities, however, left UE 2/5 proximal 3/5 finger grip, LLE proximal 2-/5 and distal 0/5. Bulk was normal and fasciculations were absent.   Motor Tone - Muscle tone was assessed at the neck and appendages and was normal.  Reflexes - The patient's reflexes were symmetrical in all extremities and she had no pathological reflexes.  Sensory - Light touch, temperature/pinprick were assessed and were decreased on the left UE and LE comparing with right.    Coordination - The patient had normal movements in the right hand with no ataxia or dysmetria.  Tremor was absent.  Gait and Station - deferred.   NIH Stroke Scale  Level Of Consciousness 0=Alert; keenly responsive 1=Arouse to minor stimulation 2=Requires repeated stimulation to arouse or movements to pain 3=postures or unresponsive 0  LOC Questions to Month and Age 35=Answers both questions correctly 1=Answers one question correctly or dysarthria/intubated/trauma/language barrier 2=Answers neither question correctly or aphasia 0  LOC Commands      -Open/Close eyes     -Open/close grip     -Pantomime commands if communication barrier 0=Performs both tasks correctly 1=Performs one task correctly 2=Performs neighter task correctly 0  Best Gaze     -Only assess horizontal gaze 0=Normal 1=Partial gaze palsy 2=Forced deviation, or total gaze paresis 0  Visual 0=No visual loss 1=Partial hemianopia 2=Complete hemianopia 3=Bilateral hemianopia (blind including cortical blindness) 0  Facial Palsy     -Use grimace if obtunded 0=Normal symmetrical movement 1=Minor paralysis (asymmetry) 2=Partial paralysis (lower face) 3=Complete paralysis (upper and lower face) 1  Motor  0=No drift for 10/5 seconds 1=Drift, but does not hit bed 2=Some antigravity effort, hits  bed 3=No effort against gravity, limb  falls 4=No movement 0=Amputation/joint fusion Right Arm 0     Leg 0    Left Arm 3     Leg 3  Limb Ataxia     - FNT/HTS 0=Absent or does not understand or paralyzed or amputation/joint fusion 1=Present in one limb 2=Present in two limbs 0  Sensory 0=Normal 1=Mild to moderate sensory loss 2=Severe to total sensory loss or coma/unresponsive 1  Best Language 0=No aphasia, normal 1=Mild to moderate aphasia 2=Severe aphasia 3=Mute, global aphasia, or coma/unresponsive 0  Dysarthria 0=Normal 1=Mild to moderate 2=Severe, unintelligible or mute/anarthric 0=intubated/unable to test 0  Extinction/Neglect 0=No abnormality 1=visual/tactile/auditory/spatia/personal inattention/Extinction to bilateral simultaneous stimulation 2=Profound neglect/extinction more  than 1 modality  0  Total   8   Premorbid Rankin scale 0   ASSESSMENT/PLAN [name] is a 86 y.o. female with history of hypertension, hypothyroidism, hyperlipidemia presenting with L sided weakness.   Stroke: R BG and CR infarct, secondary to small vessel disease source vs. Cryptogenic per Dr. Pearlean Brownie  Code Stroke CT head No acute abnormality. ASPECTS 10.     CTA head & neck no ELVO. Aortic atherosclerosis.   MRI  Posterior R corona radiata and lentiform nucleus infarct. Small vessel disease. Mild spinal stenosis C4-5  2D Echo EF 60-65%.  No cardiac source of embolism.    CT repeat unchanged - with symptom worsening 9/18  MRI brain repeat - pending  LDL 125  HgbA1c 5.4  VTE prophylaxis - SCDs   aspirin 81 mg daily prior to admission, now on aspirin 81 mg daily and clopidogrel 75 mg daily as well as BMS investigational meds. Continue current regimen per clinical trial   Therapy recommendations:  pending   Disposition:  Pending, lives w. Daughter  Hypertension  Stable on the high side 170-180s . Permissive hypertension (OK if < 220/120) but gradually normalize in 3-5 days . Avoid low BP . Long-term BP goal  normotensive  Hyperlipidemia  Home meds:  pravachol 40  Now on lipitor 80  LDL 125, goal < 70  Continue statin at discharge  Other Stroke Risk Factors  Advanced age  Other Active Problems  Hypothyroid on synthroid  Hospital day # 3  Patient condition worsened this am, has developed left sided significant worsening of weakness.  CT no acute change, MRI pending. I discussed with Dr. Caleb Popp, concerning for current stroke expansion.  Avoid low BP, continue current antiplatelet therapy. I spent  35 minutes in total face-to-face time with the patient, more than 50% of which was spent in counseling and coordination of care, reviewing test results, images and medication, and discussing the diagnosis, treatment plan and potential prognosis. This patient's care requiresreview of multiple databases, neurological assessment, discussion with family, other specialists and medical decision making of high complexity. I had long discussion with daughter at bedside, updated pt current condition, treatment plan and potential prognosis, and answered all the questions.  She expressed understanding and appreciation.   Marvel Plan, MD PhD Stroke Neurology 07/23/2020 4:26 PM    07/23/2020 5:26 AMTo contact Stroke Continuity provider, please refer to [email or web address]. After hours, contact General Neurology

## 2020-07-24 LAB — CBC WITH DIFFERENTIAL/PLATELET
Abs Immature Granulocytes: 0.02 10*3/uL (ref 0.00–0.07)
Basophils Absolute: 0.1 10*3/uL (ref 0.0–0.1)
Basophils Relative: 1 %
Eosinophils Absolute: 0.2 10*3/uL (ref 0.0–0.5)
Eosinophils Relative: 2 %
HCT: 39.6 % (ref 36.0–46.0)
Hemoglobin: 12.6 g/dL (ref 12.0–15.0)
Immature Granulocytes: 0 %
Lymphocytes Relative: 30 %
Lymphs Abs: 2.4 10*3/uL (ref 0.7–4.0)
MCH: 30.4 pg (ref 26.0–34.0)
MCHC: 31.8 g/dL (ref 30.0–36.0)
MCV: 95.7 fL (ref 80.0–100.0)
Monocytes Absolute: 0.5 10*3/uL (ref 0.1–1.0)
Monocytes Relative: 6 %
Neutro Abs: 4.8 10*3/uL (ref 1.7–7.7)
Neutrophils Relative %: 61 %
Platelets: 253 10*3/uL (ref 150–400)
RBC: 4.14 MIL/uL (ref 3.87–5.11)
RDW: 12.5 % (ref 11.5–15.5)
WBC: 8 10*3/uL (ref 4.0–10.5)
nRBC: 0 % (ref 0.0–0.2)

## 2020-07-24 MED ORDER — SENNOSIDES-DOCUSATE SODIUM 8.6-50 MG PO TABS
2.0000 | ORAL_TABLET | Freq: Two times a day (BID) | ORAL | Status: DC
  Administered 2020-07-24 – 2020-07-25 (×2): 2 via ORAL
  Filled 2020-07-24 (×2): qty 2

## 2020-07-24 MED ORDER — POLYETHYLENE GLYCOL 3350 17 G PO PACK
17.0000 g | PACK | Freq: Two times a day (BID) | ORAL | Status: DC
  Administered 2020-07-24 – 2020-07-25 (×2): 17 g via ORAL
  Filled 2020-07-24 (×3): qty 1

## 2020-07-24 NOTE — Progress Notes (Signed)
[facility]  PROGRESS NOTE  Nicole [name] ZOX:[id number] DOB: 05/25/1930 DOA: 07/20/2020 PCP: [name], [name], MD Admit date - 07/20/2020   Admitting Physician Therisa DoyneAnastassia Doutova, MD  Outpatient Primary MD for the patient is [name], [name], MD  LOS - 4 Brief Narrative   Nicole HamburgerMary J [name] is a 86 y.o. year old female with medical history significant for HTN, HLD thyroidism who presented on 07/20/2020 with new left-sided weakness for 1 day and was found to have acute CVA secondary to small vessel disease.  Hospital course complicated by worsening left-sided weakness with repeat MRI on 9/18 showing expansion of prior stroke with no new infarct    Subjective  No acute complaints.  Still has left knee pain (chronic from arthritis).  Still unable to use left arm and leg on her own.  States no BM in the last 24 hours  A & P   Acute CVA with residual left-sided deficits (left hemiplegia).  Occurred in setting of hypertension and hyperlipidemia and MRI showed small vessel infarct in the posterior right corona radiata and lentiform CTA neck shows ganglionic level infarction (caudate, lentiform nuclei, internal capsule, insula) with no signs of large vessel occlusion or high-grade stenosis. Repeat MRI on 9/18 shows mildly expanded involvement of the right posterior corona radiata area.  Patient has left-sided hemiplegia. .  A1c 5.4, LDL 125.  TTE with preserved EF -Continue aspirin, Plavix x3 weeks, followed by Plavix alone -Continue high intensity atorvastatin started in the hospital -Continue monitoring on telemetry -PT recommends CIR -CIR consulted  Hypertension, slightly elevated with SBP's in the 140s currently allowing permissive hypertension up to 220/120 as directed by neurology -Holding home BP medications  Hypothyroidism -Continue home Synthroid  Hypokalemia mild, resolved likely in setting of home HCTZ magnesium within normal limits -Repleted with potassium -Monitor  BMP  Leukocytosis, resolved.  Suspect stress related to acute CVA.  Has no localizing symptoms of infection.  Remains afebrile.  UA unremarkable.  Chest x-ray unremarkable. -Monitor CBC    Family Communication  : Daughter updated at bedside on 9/18, will update today as well Code Status : Full  Disposition Plan  :  Patient is from home. Anticipated d/c date:  Medically stable, currently awaiting ability to go to CIR barriers to d/c or necessity for inpatient status:  PT recommends CIR Consults  : Neurology  Procedures  : TTE 9/16 DVT Prophylaxis  : Aspirin, Plavix  Lab Results  Component Value Date   PLT 253 07/24/2020    Diet :  Diet Order            Diet Heart Room service appropriate? Yes; Fluid consistency: Thin  Diet effective now                  Inpatient Medications Scheduled Meds: . atorvastatin  80 mg Oral Daily  . levothyroxine  50 mcg Oral QAC breakfast  . oxybutynin  5 mg Oral QHS  . senna-docusate  1 tablet Oral BID  . STUDY - AXIOMATIC - aspirin 100mg  (PI - Sethi)  100 mg Oral QAC breakfast  . STUDY - AXIOMATIC - [id number] or placebo (PI - Sethi)  2 capsule Oral BID  . STUDY - AXIOMATIC - clopidogrel 75 mg (PI - Sethi)  75 mg Oral Q24H   Continuous Infusions:  PRN Meds:.acetaminophen **OR** acetaminophen (TYLENOL) oral liquid 160 mg/5 mL **OR** acetaminophen, albuterol  Antibiotics  :   Anti-infectives (From admission, onward)   None  Objective   Vitals:   07/23/20 2054 07/24/20 0002 07/24/20 0311 07/24/20 0754  BP: 134/73 (!) 153/71 128/67 (!) 146/67  Pulse: 79 81 77 76  Resp: 18 19 18 16   Temp: 98.2 F (36.8 C) 98.1 F (36.7 C) 97.9 F (36.6 C) 97.8 F (36.6 C)  TempSrc: Oral Oral Oral Oral  SpO2: 97% 95% 91% 94%  Weight:      Height:        SpO2: 94 %  Wt Readings from Last 3 Encounters:  07/22/20 69.1 kg  07/06/15 70.8 kg     Intake/Output Summary (Last 24 hours) at 07/24/2020 1130 Last data filed at 07/24/2020  0754 Gross per 24 hour  Intake 360 ml  Output 350 ml  Net 10 ml    Physical Exam:     Awake Alert, Oriented X 3, Normal affect Left hemiplegia, no obvious facial droop, normal speech  [place].AT, Normal respiratory effort on room air, CTAB RRR,No Gallops,Rubs or new Murmurs,  +ve B.Sounds, Abd Soft, No tenderness, No rebound, guarding or rigidity. No Cyanosis, No new Rash or bruise   I have personally reviewed the following:   Data Reviewed:  CBC Recent Labs  Lab 07/20/20 2001 07/20/20 2008 07/22/20 0431 07/23/20 0132 07/24/20 0617  WBC 10.8*  --  10.7* 9.4 8.0  HGB 13.3 14.3 12.9 12.5 12.6  HCT 42.0 42.0 41.2 39.2 39.6  PLT 252  --  228 233 253  MCV 96.3  --  97.2 95.6 95.7  MCH 30.5  --  30.4 30.5 30.4  MCHC 31.7  --  31.3 31.9 31.8  RDW 12.2  --  12.5 12.4 12.5  LYMPHSABS 3.5  --  3.0 2.7 2.4  MONOABS 0.6  --  0.8 0.6 0.5  EOSABS 0.2  --  0.2 0.1 0.2  BASOSABS 0.1  --  0.1 0.1 0.1    Chemistries  Recent Labs  Lab 07/20/20 2001 07/20/20 2008 07/21/20 1412 07/22/20 0431 07/22/20 1213 07/23/20 0132  NA 137 139  --  137  --  138  K 3.3* 3.3*  --  3.4*  --  3.9  CL 100 102  --  105  --  105  CO2 25  --   --  23  --  24  GLUCOSE 139* 136*  --  112*  --  106*  BUN 16 19  --  8  --  13  CREATININE 0.92 0.80  --  0.66  --  0.85  CALCIUM 10.0  --   --  9.3  --  9.5  MG  --   --  2.1  --  2.1  --   AST 30  --   --   --   --   --   ALT 23  --   --   --   --   --   ALKPHOS 48  --   --   --   --   --   BILITOT 1.1  --   --   --   --   --    ------------------------------------------------------------------------------------------------------------------ Recent Labs    07/21/20 1412  CHOL 195  HDL 51  LDLCALC 128*  TRIG 80  CHOLHDL 3.8    Lab Results  Component Value Date   HGBA1C 5.4 07/21/2020   ------------------------------------------------------------------------------------------------------------------ No results for input(s): TSH, T4TOTAL,  T3FREE, THYROIDAB in the last 72 hours.  Invalid input(s): FREET3 ------------------------------------------------------------------------------------------------------------------ No results for input(s): VITAMINB12, FOLATE, FERRITIN, TIBC, IRON,  RETICCTPCT in the last 72 hours.  Coagulation profile Recent Labs  Lab 07/20/20 2001  INR 0.9    No results for input(s): DDIMER in the last 72 hours.  Cardiac Enzymes No results for input(s): CKMB, TROPONINI, MYOGLOBIN in the last 168 hours.  Invalid input(s): CK ------------------------------------------------------------------------------------------------------------------ No results found for: BNP  Micro Results Recent Results (from the past 240 hour(s))  SARS Coronavirus 2 by RT PCR (hospital order, performed in [facility] hospital lab) Nasopharyngeal Nasopharyngeal Swab     Status: None   Collection Time: 07/21/20  2:16 AM   Specimen: Nasopharyngeal Swab  Result Value Ref Range Status   SARS Coronavirus 2 NEGATIVE NEGATIVE Final    Comment: (NOTE) SARS-CoV-2 target nucleic acids are NOT DETECTED.  The SARS-CoV-2 RNA is generally detectable in upper and lower respiratory specimens during the acute phase of infection. The lowest concentration of SARS-CoV-2 viral copies this assay can detect is 250 copies / mL. A negative result does not preclude SARS-CoV-2 infection and should not be used as the sole basis for treatment or other patient management decisions.  A negative result may occur with improper specimen collection / handling, submission of specimen other than nasopharyngeal swab, presence of viral mutation(s) within the areas targeted by this assay, and inadequate number of viral copies (<250 copies / mL). A negative result must be combined with clinical observations, patient history, and epidemiological information.  Fact Sheet for Patients:   BoilerBrush.com.cy  Fact Sheet for Healthcare  Providers: https://pope.com/  This test is not yet approved or  cleared by the [place] FDA and has been authorized for detection and/or diagnosis of SARS-CoV-2 by FDA under an Emergency Use Authorization (EUA).  This EUA will remain in effect (meaning this test can be used) for the duration of the COVID-19 declaration under Section 564(b)(1) of the Act, 21 U.S.C. section 360bbb-3(b)(1), unless the authorization is terminated or revoked sooner.  Performed at [facility], [address], [place], [place] [address]     Radiology Reports DG Chest 2 View  Result Date: 07/21/2020 CLINICAL DATA:  CVA symptoms EXAM: CHEST - 2 VIEW COMPARISON:  None. FINDINGS: Frontal and lateral views of the chest demonstrate an unremarkable cardiac silhouette. Dense atherosclerosis of the aortic arch. No airspace disease, effusion, or pneumothorax. No acute bony abnormalities. IMPRESSION: 1. No acute intrathoracic process. Electronically Signed   By: Sharlet Salina M.D.   On: 07/21/2020 02:18   CT HEAD WO CONTRAST  Result Date: 07/23/2020 CLINICAL DATA:  Stroke follow-up EXAM: CT HEAD WITHOUT CONTRAST TECHNIQUE: Contiguous axial images were obtained from the base of the skull through the vertex without intravenous contrast. COMPARISON:  MRI head 07/22/2020, 07/20/2020 FINDINGS: Brain: Hypodensity right posterior lentiform nucleus again noted compatible with recent infarct. No change in size. No new area of infarct or hemorrhage. Mild atrophy. Chronic microvascular ischemic changes throughout the white matter. Chronic infarct right internal capsule anteriorly. Negative for mass lesion. Vascular: Negative for hyperdense vessel Skull: Negative Sinuses/Orbits: Paranasal sinuses clear. Bilateral cataract extraction Other: None IMPRESSION: Acute infarct in the right lentiform nucleus unchanged from recent MRI. No acute hemorrhage Atrophy and chronic microvascular ischemic change in the  white matter. Electronically Signed   By: Marlan Palau M.D.   On: 07/23/2020 13:07   MR BRAIN WO CONTRAST  Result Date: 07/23/2020 CLINICAL DATA:  86 year old female with code stroke presentation, left side weakness, small vessel ischemia in the posterior right corona radiata and lentiform diagnosed on MRI 07/20/2020.  EXAM: LIMITED MRI HEAD WITHOUT CONTRAST TECHNIQUE: Diffusion-weighted imaging only requested by neurology. COMPARISON:  Brain MRI yesterday, 07/20/2020. FINDINGS: Axial and coronal DWI. Restricted diffusion has increased in intensity since 07/20/2020 in the affected area from the posterior right corona radiata through the right lentiform. Compare series 7, image 53 today to series 7, image 53 on 07/20/2020. The area of involvement has mildly expanded. But there is no new restricted diffusion elsewhere. No intracranial mass effect. IMPRESSION: Increased intensity and mildly expanded diffusion restriction from the right posterior corona radiata through the right lentiform since 07/20/2020. No new areas of ischemia.  No intracranial mass effect. Electronically Signed   By: Odessa Fleming M.D.   On: 07/23/2020 15:45   MR BRAIN WO CONTRAST  Result Date: 07/22/2020 CLINICAL DATA:  86 year old female. BMS AXIOMATIC STROKE STUDY PROTOCOL. EXAM: MRI HEAD WITHOUT CONTRAST TECHNIQUE: Multiplanar, multiecho pulse sequences of the brain and surrounding structures were obtained without intravenous contrast. COMPARISON:  Brain MRI 07/20/2020. FINDINGS: Curvilinear restricted diffusion redemonstrated from the posterior corona radiata to the posterior right lentiform. DWI appearance not significantly changed. No associated mass effect. No acute or chronic blood products identified on T2 * GRE. No other restricted diffusion. Increased FLAIR hyperintensity from 2 days ago. Elsewhere stable gray and white matter signal on axial FLAIR and T1 weighted imaging. IMPRESSION: Expected evolution of recent small vessel infarct  affecting the posterior right corona radiata through lentiform. No associated hemorrhage or mass effect. Electronically Signed   By: Odessa Fleming M.D.   On: 07/22/2020 21:38   MR BRAIN WO CONTRAST  Result Date: 07/20/2020 CLINICAL DATA:  86 year old female code stroke presentation, left side weakness. EXAM: MRI HEAD WITHOUT CONTRAST TECHNIQUE: Multiplanar, multiecho pulse sequences of the brain and surrounding structures were obtained without intravenous contrast. COMPARISON:  CT head, CTA head and neck earlier today. FINDINGS: Brain: Discontinuous curvilinear restricted diffusion is noted from the posterior right corona radiata tracking into the posterior right lentiform, best seen on series 7, image 53). Mild if any associated T2 and FLAIR hyperintensity. No hemorrhage or mass effect. No other restricted diffusion. Patchy and scattered bilateral cerebral white matter T2 and FLAIR hyperintensity elsewhere. No cortical encephalomalacia. No chronic cerebral blood products. Moderate T2 heterogeneity in the bilateral deep gray nuclei, most resembling lacunar infarcts in the basal ganglia. Brainstem and cerebellum are within normal limits for age. No midline shift, mass effect, evidence of mass lesion, ventriculomegaly, extra-axial collection or acute intracranial hemorrhage. Cervicomedullary junction and pituitary are within normal limits. Vascular: Major intracranial vascular flow voids are preserved. Skull and upper cervical spine: Partially visible multilevel cervical spine degeneration. Mild degenerative spinal stenosis suspected at C4-C5. Normal bone marrow signal. Sinuses/Orbits: Postoperative changes to both globes, otherwise negative. Other: Visible internal auditory structures appear normal. Visible scalp and face appear negative. IMPRESSION: 1. Positive for acute small vessel infarct in the posterior right corona radiata and lentiform. No associated hemorrhage or mass effect. 2. Underlying chronic small vessel  disease, including in the bilateral basal ganglia. 3. Mild degenerative spinal stenosis suspected at C4-C5. Electronically Signed   By: Odessa Fleming M.D.   On: 07/20/2020 22:47   ECHOCARDIOGRAM COMPLETE  Result Date: 07/21/2020    ECHOCARDIOGRAM REPORT   Patient Name:   Nicole [name] Date of Exam: 07/21/2020 Medical Rec #:  [id number]     Height:       64.0 in Accession #:    [id number]    Weight:       156.0  lb Date of Birth:  [birth year]      BSA:          1.760 m Patient Age:    86 years      BP:           143/79 mmHg Patient Gender: F             HR:           81 bpm. Exam Location:  Inpatient Procedure: 2D Echo Indications:    stroke 434.91  History:        Patient has no prior history of Echocardiogram examinations.                 Risk Factors:Hypertension.  Sonographer:    Celene Skeen RDCS (AE) Referring Phys: [id number] ANASTASSIA DOUTOVA IMPRESSIONS  1. Left ventricular ejection fraction, by estimation, is 60 to 65%. The left ventricle has normal function. The left ventricle has no regional wall motion abnormalities. Left ventricular diastolic parameters are indeterminate.  2. Right ventricular systolic function is normal. The right ventricular size is mildly enlarged.  3. The mitral valve is normal in structure. Mild mitral valve regurgitation.  4. The aortic valve is abnormal. Aortic valve regurgitation is not visualized. Mild aortic valve sclerosis is present, with no evidence of aortic valve stenosis.  5. The inferior vena cava is normal in size with greater than 50% respiratory variability, suggesting right atrial pressure of 3 mmHg. FINDINGS  Left Ventricle: Left ventricular ejection fraction, by estimation, is 60 to 65%. The left ventricle has normal function. The left ventricle has no regional wall motion abnormalities. The left ventricular internal cavity size was normal in size. There is  no left ventricular hypertrophy. Left ventricular diastolic parameters are indeterminate. Right Ventricle: The right  ventricular size is mildly enlarged. No increase in right ventricular wall thickness. Right ventricular systolic function is normal. Left Atrium: Left atrial size was normal in size. Right Atrium: Right atrial size was normal in size. Pericardium: There is no evidence of pericardial effusion. Mitral Valve: The mitral valve is normal in structure. Mild mitral valve regurgitation. Tricuspid Valve: The tricuspid valve is grossly normal. Tricuspid valve regurgitation is trivial. Aortic Valve: The aortic valve is abnormal. Aortic valve regurgitation is not visualized. Mild aortic valve sclerosis is present, with no evidence of aortic valve stenosis. Pulmonic Valve: The pulmonic valve was not well visualized. Pulmonic valve regurgitation is not visualized. Aorta: The aortic root and ascending aorta are structurally normal, with no evidence of dilitation and the aortic root is normal in size and structure. Venous: The inferior vena cava is normal in size with greater than 50% respiratory variability, suggesting right atrial pressure of 3 mmHg. IAS/Shunts: The interatrial septum was not assessed.  LEFT VENTRICLE PLAX 2D LVIDd:         3.83 cm  Diastology LVIDs:         2.52 cm  LV e' medial:    6.74 cm/s LV PW:         0.81 cm  LV E/e' medial:  9.8 LV IVS:        0.88 cm  LV e' lateral:   7.72 cm/s LVOT diam:     1.90 cm  LV E/e' lateral: 8.5 LV SV:         55 LV SV Index:   31 LVOT Area:     2.84 cm  RIGHT VENTRICLE TAPSE (M-mode): 2.5 cm LEFT ATRIUM  Index       RIGHT ATRIUM           Index LA diam:        3.00 cm 1.70 cm/m  RA Area:     12.80 cm LA Vol (A2C):   53.9 ml 30.62 ml/m RA Volume:   29.10 ml  16.53 ml/m LA Vol (A4C):   24.5 ml 13.92 ml/m LA Biplane Vol: 36.8 ml 20.91 ml/m  AORTIC VALVE LVOT Vmax:   80.30 cm/s LVOT Vmean:  61.000 cm/s LVOT VTI:    0.194 m  AORTA Ao Root diam: 2.80 cm MITRAL VALVE MV Area (PHT): 2.83 cm    SHUNTS MV Decel Time: 268 msec    Systemic VTI:  0.19 m MV E velocity:  66.00 cm/s  Systemic Diam: 1.90 cm MV A velocity: 80.60 cm/s MV E/A ratio:  0.82 Dietrich Pates MD Electronically signed by Dietrich Pates MD Signature Date/Time: 07/21/2020/3:17:28 PM    Final    CT HEAD CODE STROKE WO CONTRAST  Result Date: 07/20/2020 CLINICAL DATA:  Code stroke.  Left-sided weakness and slurred speech EXAM: CT HEAD WITHOUT CONTRAST CT ANGIOGRAPHY OF THE HEAD AND NECK TECHNIQUE: Contiguous axial images were obtained from the base of the skull through the vertex without intravenous contrast. Multidetector CT imaging of the head and neck was performed using the standard protocol during bolus administration of intravenous contrast. Multiplanar CT image reconstructions and MIPs were obtained to evaluate the vascular anatomy. Carotid stenosis measurements (when applicable) are obtained utilizing NASCET criteria, using the distal internal carotid diameter as the denominator. CONTRAST:  50mL OMNIPAQUE IOHEXOL 350 MG/ML SOLN COMPARISON:  None. FINDINGS: CT HEAD FINDINGS Brain: There is no mass, hemorrhage or extra-axial collection. The size and configuration of the ventricles and extra-axial CSF spaces are normal. There is hypoattenuation of the periventricular white matter, most commonly indicating chronic ischemic microangiopathy. Vascular: No abnormal hyperdensity of the major intracranial arteries or dural venous sinuses. No intracranial atherosclerosis. Skull: The visualized skull base, calvarium and extracranial soft tissues are normal. Sinuses/Orbits: No fluid levels or advanced mucosal thickening of the visualized paranasal sinuses. No mastoid or middle ear effusion. The orbits are normal. ASPECTS (Alberta Stroke Program Early CT Score) - Ganglionic level infarction (caudate, lentiform nuclei, internal capsule, insula, M1-M3 cortex): 7 - Supraganglionic infarction (M4-M6 cortex): 3 Total score (0-10 with 10 being normal): 10 CTA NECK FINDINGS SKELETON: There is no bony spinal canal stenosis. No lytic  or blastic lesion. OTHER NECK: Normal pharynx, larynx and major salivary glands. No cervical lymphadenopathy. Unremarkable thyroid gland. UPPER CHEST: No pneumothorax or pleural effusion. No nodules or masses. AORTIC ARCH: There is calcific atherosclerosis of the aortic arch. There is no aneurysm, dissection or hemodynamically significant stenosis of the visualized portion of the aorta. Conventional 3 vessel aortic branching pattern. The visualized proximal subclavian arteries are widely patent. RIGHT CAROTID SYSTEM: No dissection, occlusion or aneurysm. Mild atherosclerotic calcification at the carotid bifurcation without hemodynamically significant stenosis. LEFT CAROTID SYSTEM: No dissection, occlusion or aneurysm. Mild atherosclerotic calcification at the carotid bifurcation without hemodynamically significant stenosis. VERTEBRAL ARTERIES: Left dominant configuration. Both origins are clearly patent. There is no dissection, occlusion or flow-limiting stenosis to the skull base (V1-V3 segments). CTA HEAD FINDINGS POSTERIOR CIRCULATION: --Vertebral arteries: Mild atherosclerotic calcification of the left V4 segment. Otherwise normal. --Inferior cerebellar arteries: Normal. --Basilar artery: Normal. --Superior cerebellar arteries: Normal. --Posterior cerebral arteries (PCA): Normal. ANTERIOR CIRCULATION: --Intracranial internal carotid arteries: Normal. --Anterior cerebral arteries (ACA): Normal. Both A1  segments are present. Patent anterior communicating artery (a-comm). --Middle cerebral arteries (MCA): Normal. VENOUS SINUSES: As permitted by contrast timing, patent. ANATOMIC VARIANTS: Hypoplastic right ACA A1 segment, a common variant. Review of the MIP images confirms the above findings. IMPRESSION: 1. No emergent large vessel occlusion or high-grade stenosis of the head or neck. Aortic Atherosclerosis (ICD10-I70.0). Electronically Signed   By: Deatra Robinson M.D.   On: 07/20/2020 20:34   CT ANGIO HEAD CODE  STROKE  Result Date: 07/20/2020 CLINICAL DATA:  Code stroke.  Left-sided weakness and slurred speech EXAM: CT HEAD WITHOUT CONTRAST CT ANGIOGRAPHY OF THE HEAD AND NECK TECHNIQUE: Contiguous axial images were obtained from the base of the skull through the vertex without intravenous contrast. Multidetector CT imaging of the head and neck was performed using the standard protocol during bolus administration of intravenous contrast. Multiplanar CT image reconstructions and MIPs were obtained to evaluate the vascular anatomy. Carotid stenosis measurements (when applicable) are obtained utilizing NASCET criteria, using the distal internal carotid diameter as the denominator. CONTRAST:  82mL OMNIPAQUE IOHEXOL 350 MG/ML SOLN COMPARISON:  None. FINDINGS: CT HEAD FINDINGS Brain: There is no mass, hemorrhage or extra-axial collection. The size and configuration of the ventricles and extra-axial CSF spaces are normal. There is hypoattenuation of the periventricular white matter, most commonly indicating chronic ischemic microangiopathy. Vascular: No abnormal hyperdensity of the major intracranial arteries or dural venous sinuses. No intracranial atherosclerosis. Skull: The visualized skull base, calvarium and extracranial soft tissues are normal. Sinuses/Orbits: No fluid levels or advanced mucosal thickening of the visualized paranasal sinuses. No mastoid or middle ear effusion. The orbits are normal. ASPECTS (Alberta Stroke Program Early CT Score) - Ganglionic level infarction (caudate, lentiform nuclei, internal capsule, insula, M1-M3 cortex): 7 - Supraganglionic infarction (M4-M6 cortex): 3 Total score (0-10 with 10 being normal): 10 CTA NECK FINDINGS SKELETON: There is no bony spinal canal stenosis. No lytic or blastic lesion. OTHER NECK: Normal pharynx, larynx and major salivary glands. No cervical lymphadenopathy. Unremarkable thyroid gland. UPPER CHEST: No pneumothorax or pleural effusion. No nodules or masses. AORTIC  ARCH: There is calcific atherosclerosis of the aortic arch. There is no aneurysm, dissection or hemodynamically significant stenosis of the visualized portion of the aorta. Conventional 3 vessel aortic branching pattern. The visualized proximal subclavian arteries are widely patent. RIGHT CAROTID SYSTEM: No dissection, occlusion or aneurysm. Mild atherosclerotic calcification at the carotid bifurcation without hemodynamically significant stenosis. LEFT CAROTID SYSTEM: No dissection, occlusion or aneurysm. Mild atherosclerotic calcification at the carotid bifurcation without hemodynamically significant stenosis. VERTEBRAL ARTERIES: Left dominant configuration. Both origins are clearly patent. There is no dissection, occlusion or flow-limiting stenosis to the skull base (V1-V3 segments). CTA HEAD FINDINGS POSTERIOR CIRCULATION: --Vertebral arteries: Mild atherosclerotic calcification of the left V4 segment. Otherwise normal. --Inferior cerebellar arteries: Normal. --Basilar artery: Normal. --Superior cerebellar arteries: Normal. --Posterior cerebral arteries (PCA): Normal. ANTERIOR CIRCULATION: --Intracranial internal carotid arteries: Normal. --Anterior cerebral arteries (ACA): Normal. Both A1 segments are present. Patent anterior communicating artery (a-comm). --Middle cerebral arteries (MCA): Normal. VENOUS SINUSES: As permitted by contrast timing, patent. ANATOMIC VARIANTS: Hypoplastic right ACA A1 segment, a common variant. Review of the MIP images confirms the above findings. IMPRESSION: 1. No emergent large vessel occlusion or high-grade stenosis of the head or neck. Aortic Atherosclerosis (ICD10-I70.0). Electronically Signed   By: Deatra Robinson M.D.   On: 07/20/2020 20:34   CT ANGIO NECK CODE STROKE  Result Date: 07/20/2020 CLINICAL DATA:  Code stroke.  Left-sided weakness and slurred speech EXAM:  CT HEAD WITHOUT CONTRAST CT ANGIOGRAPHY OF THE HEAD AND NECK TECHNIQUE: Contiguous axial images were obtained  from the base of the skull through the vertex without intravenous contrast. Multidetector CT imaging of the head and neck was performed using the standard protocol during bolus administration of intravenous contrast. Multiplanar CT image reconstructions and MIPs were obtained to evaluate the vascular anatomy. Carotid stenosis measurements (when applicable) are obtained utilizing NASCET criteria, using the distal internal carotid diameter as the denominator. CONTRAST:  50mL OMNIPAQUE IOHEXOL 350 MG/ML SOLN COMPARISON:  None. FINDINGS: CT HEAD FINDINGS Brain: There is no mass, hemorrhage or extra-axial collection. The size and configuration of the ventricles and extra-axial CSF spaces are normal. There is hypoattenuation of the periventricular white matter, most commonly indicating chronic ischemic microangiopathy. Vascular: No abnormal hyperdensity of the major intracranial arteries or dural venous sinuses. No intracranial atherosclerosis. Skull: The visualized skull base, calvarium and extracranial soft tissues are normal. Sinuses/Orbits: No fluid levels or advanced mucosal thickening of the visualized paranasal sinuses. No mastoid or middle ear effusion. The orbits are normal. ASPECTS (Alberta Stroke Program Early CT Score) - Ganglionic level infarction (caudate, lentiform nuclei, internal capsule, insula, M1-M3 cortex): 7 - Supraganglionic infarction (M4-M6 cortex): 3 Total score (0-10 with 10 being normal): 10 CTA NECK FINDINGS SKELETON: There is no bony spinal canal stenosis. No lytic or blastic lesion. OTHER NECK: Normal pharynx, larynx and major salivary glands. No cervical lymphadenopathy. Unremarkable thyroid gland. UPPER CHEST: No pneumothorax or pleural effusion. No nodules or masses. AORTIC ARCH: There is calcific atherosclerosis of the aortic arch. There is no aneurysm, dissection or hemodynamically significant stenosis of the visualized portion of the aorta. Conventional 3 vessel aortic branching  pattern. The visualized proximal subclavian arteries are widely patent. RIGHT CAROTID SYSTEM: No dissection, occlusion or aneurysm. Mild atherosclerotic calcification at the carotid bifurcation without hemodynamically significant stenosis. LEFT CAROTID SYSTEM: No dissection, occlusion or aneurysm. Mild atherosclerotic calcification at the carotid bifurcation without hemodynamically significant stenosis. VERTEBRAL ARTERIES: Left dominant configuration. Both origins are clearly patent. There is no dissection, occlusion or flow-limiting stenosis to the skull base (V1-V3 segments). CTA HEAD FINDINGS POSTERIOR CIRCULATION: --Vertebral arteries: Mild atherosclerotic calcification of the left V4 segment. Otherwise normal. --Inferior cerebellar arteries: Normal. --Basilar artery: Normal. --Superior cerebellar arteries: Normal. --Posterior cerebral arteries (PCA): Normal. ANTERIOR CIRCULATION: --Intracranial internal carotid arteries: Normal. --Anterior cerebral arteries (ACA): Normal. Both A1 segments are present. Patent anterior communicating artery (a-comm). --Middle cerebral arteries (MCA): Normal. VENOUS SINUSES: As permitted by contrast timing, patent. ANATOMIC VARIANTS: Hypoplastic right ACA A1 segment, a common variant. Review of the MIP images confirms the above findings. IMPRESSION: 1. No emergent large vessel occlusion or high-grade stenosis of the head or neck. Aortic Atherosclerosis (ICD10-I70.0). Electronically Signed   By: Deatra Robinson M.D.   On: 07/20/2020 20:34     Time Spent in minutes  30     Laverna Peace M.D on 07/24/2020 at 11:30 AM  To page go to [email or web address] - password [email or web address]

## 2020-07-24 NOTE — Progress Notes (Signed)
STROKE TEAM PROGRESS NOTE   INTERVAL HISTORY No family at bedside. Pt awake alert lying in bed, still has left hemiplegia. Had MRI repeat showed slight enlargement of the infarct. No acute change over night. Neuro stable.    Vitals:   07/24/20 0002 07/24/20 0311 07/24/20 0754 07/24/20 1230  BP: (!) 153/71 128/67 (!) 146/67 137/60  Pulse: 81 77 76 84  Resp: 19 18 16 18   Temp: 98.1 F (36.7 C) 97.9 F (36.6 C) 97.8 F (36.6 C) 98.3 F (36.8 C)  TempSrc: Oral Oral Oral Oral  SpO2: 95% 91% 94% 100%  Weight:      Height:       CBC:  Recent Labs  Lab 07/23/20 0132 07/24/20 0617  WBC 9.4 8.0  NEUTROABS 6.0 4.8  HGB 12.5 12.6  HCT 39.2 39.6  MCV 95.6 95.7  PLT 233 253   Basic Metabolic Panel:  Recent Labs  Lab 07/20/20 2008 07/21/20 1412 07/22/20 0431 07/22/20 1213 07/23/20 0132  NA   < >  --  137  --  138  K   < >  --  3.4*  --  3.9  CL   < >  --  105  --  105  CO2   < >  --  23  --  24  GLUCOSE   < >  --  112*  --  106*  BUN   < >  --  8  --  13  CREATININE   < >  --  0.66  --  0.85  CALCIUM   < >  --  9.3  --  9.5  MG  --  2.1  --  2.1  --   PHOS  --   --   --  2.4*  --    < > = values in this interval not displayed.   Lipid Panel:  Recent Labs  Lab 07/21/20 1412  CHOL 195  TRIG 80  HDL 51  CHOLHDL 3.8  VLDL 16  LDLCALC 07/23/20*   HgbA1c:  Recent Labs  Lab 07/21/20 0330  HGBA1C 5.4   Urine Drug Screen:  Recent Labs  Lab 07/20/20 2037  LABOPIA NONE DETECTED  COCAINSCRNUR NONE DETECTED  LABBENZ NONE DETECTED  AMPHETMU NONE DETECTED  THCU NONE DETECTED  LABBARB NONE DETECTED    Alcohol Level  Recent Labs  Lab 07/20/20 2001  ETH <10    IMAGING past 24 hours MR BRAIN WO CONTRAST  Result Date: 07/23/2020 CLINICAL DATA:  86 year old female with code stroke presentation, left side weakness, small vessel ischemia in the posterior right corona radiata and lentiform diagnosed on MRI 07/20/2020. EXAM: LIMITED MRI HEAD WITHOUT CONTRAST TECHNIQUE:  Diffusion-weighted imaging only requested by neurology. COMPARISON:  Brain MRI yesterday, 07/20/2020. FINDINGS: Axial and coronal DWI. Restricted diffusion has increased in intensity since 07/20/2020 in the affected area from the posterior right corona radiata through the right lentiform. Compare series 7, image 53 today to series 7, image 53 on 07/20/2020. The area of involvement has mildly expanded. But there is no new restricted diffusion elsewhere. No intracranial mass effect. IMPRESSION: Increased intensity and mildly expanded diffusion restriction from the right posterior corona radiata through the right lentiform since 07/20/2020. No new areas of ischemia.  No intracranial mass effect. Electronically Signed   By: 07/22/2020 M.D.   On: 07/23/2020 15:45    PHYSICAL EXAM  Temp:  [97.8 F (36.6 C)-98.3 F (36.8 C)] 98.3 F (36.8 C) (09/19 1230)  Pulse Rate:  [76-84] 84 (09/19 1230) Resp:  [16-19] 18 (09/19 1230) BP: (128-153)/(60-83) 137/60 (09/19 1230) SpO2:  [91 %-100 %] 100 % (09/19 1230)  General - Well nourished, well developed, in no apparent distress.  Ophthalmologic - fundi not visualized due to noncooperation.  Cardiovascular - Regular rhythm and rate.  Mental Status -  Level of arousal and orientation to time, place, and person were intact. Language including expression, naming, repetition, comprehension was assessed and found intact. Fund of Knowledge was assessed and was intact.  Cranial Nerves II - XII - II - Visual field intact OU. III, IV, VI - Extraocular movements intact. V - Facial sensation intact bilaterally. VII - mild left nasolabial fold flattening. VIII - Hearing & vestibular intact bilaterally. X - Palate elevates symmetrically. XI - Chin turning & shoulder shrug intact bilaterally. XII - Tongue protrusion intact.  Motor Strength - The patient's strength was normal in right upper and lower extremities, however, left UE 2-/5 proximal 2/5 finger grip, LLE  proximal 2-/5 and distal 0/5. Bulk was normal and fasciculations were absent.   Motor Tone - Muscle tone was assessed at the neck and appendages and was normal.  Reflexes - The patient's reflexes were symmetrical in all extremities and she had no pathological reflexes.  Sensory - Light touch, temperature/pinprick were assessed and were decreased on the left UE and LE comparing with right.    Coordination - The patient had normal movements in the right hand with no ataxia or dysmetria.  Tremor was absent.  Gait and Station - deferred.   NIH Stroke Scale  Level Of Consciousness 0=Alert; keenly responsive 1=Arouse to minor stimulation 2=Requires repeated stimulation to arouse or movements to pain 3=postures or unresponsive 0  LOC Questions to Month and Age 51=Answers both questions correctly 1=Answers one question correctly or dysarthria/intubated/trauma/language barrier 2=Answers neither question correctly or aphasia 0  LOC Commands      -Open/Close eyes     -Open/close grip     -Pantomime commands if communication barrier 0=Performs both tasks correctly 1=Performs one task correctly 2=Performs neighter task correctly 0  Best Gaze     -Only assess horizontal gaze 0=Normal 1=Partial gaze palsy 2=Forced deviation, or total gaze paresis 0  Visual 0=No visual loss 1=Partial hemianopia 2=Complete hemianopia 3=Bilateral hemianopia (blind including cortical blindness) 0  Facial Palsy     -Use grimace if obtunded 0=Normal symmetrical movement 1=Minor paralysis (asymmetry) 2=Partial paralysis (lower face) 3=Complete paralysis (upper and lower face) 1  Motor  0=No drift for 10/5 seconds 1=Drift, but does not hit bed 2=Some antigravity effort, hits  bed 3=No effort against gravity, limb falls 4=No movement 0=Amputation/joint fusion Right Arm 0     Leg 0    Left Arm 3     Leg 3  Limb Ataxia     - FNT/HTS 0=Absent or does not understand or paralyzed or amputation/joint  fusion 1=Present in one limb 2=Present in two limbs 0  Sensory 0=Normal 1=Mild to moderate sensory loss 2=Severe to total sensory loss or coma/unresponsive 1  Best Language 0=No aphasia, normal 1=Mild to moderate aphasia 2=Severe aphasia 3=Mute, global aphasia, or coma/unresponsive 0  Dysarthria 0=Normal 1=Mild to moderate 2=Severe, unintelligible or mute/anarthric 0=intubated/unable to test 0  Extinction/Neglect 0=No abnormality 1=visual/tactile/auditory/spatia/personal inattention/Extinction to bilateral simultaneous stimulation 2=Profound neglect/extinction more than 1 modality  0  Total   8   Premorbid Rankin scale 0   ASSESSMENT/PLAN [name] is a 86 y.o. female with history of hypertension,  hypothyroidism, hyperlipidemia presenting with L sided weakness.   Stroke: R BG and CR infarct, secondary to small vessel disease source vs. Cryptogenic per Dr. Pearlean Brownie  Code Stroke CT head No acute abnormality. ASPECTS 10.     CTA head & neck no ELVO. Aortic atherosclerosis.   MRI  Posterior R corona radiata and lentiform nucleus infarct. Small vessel disease. Mild spinal stenosis C4-5  2D Echo EF 60-65%.  No cardiac source of embolism.    CT repeat 9/18 - unchanged  (symptom worsening 9/18)  MRI repeat 9/18 - mildly expanded diffusion restriction from the right posterior corona radiata through the right lentiform since 07/20/2020.    LDL 125  HgbA1c 5.4  VTE prophylaxis - SCDs   aspirin 81 mg daily prior to admission, now on aspirin 81 mg daily and clopidogrel 75 mg daily as well as BMS investigational meds. Continue current regimen per clinical trial   Therapy recommendations:  Possible CIR candidate  Disposition:  Pending, lives w. Daughter  Hypertension  Stable on the high side 170-180s . Permissive hypertension (OK if < 220/120) but gradually normalize in 3-5 days . Avoid low BP . Long-term BP goal normotensive  Hyperlipidemia  Home meds:  pravachol  40  Now on lipitor 80  LDL 125, goal < 70  Continue statin at discharge  Other Stroke Risk Factors  Advanced age  Other Active Problems  Hypothyroid on synthroid  Hospital day # 4  Neurology will sign off. Please call with questions. Pt will follow up with stroke clinic Dr. Pearlean Brownie at [facility] in about 4 weeks. Thanks for the consult.  Marvel Plan, MD PhD Stroke Neurology 07/24/2020 3:09 PM    07/24/2020 1:14 PMTo contact Stroke Continuity provider, please refer to [email or web address]. After hours, contact General Neurology

## 2020-07-24 NOTE — Evaluation (Signed)
Speech Language Pathology Evaluation Patient Details Name: Nicole [name] MRN: [id number] DOB: 08/29/1930 Today's Date: 07/24/2020 Time: 2229-7989 SLP Time Calculation (min) (ACUTE ONLY): 23 min  Problem List:  Patient Active Problem List   Diagnosis Date Noted  . Hypokalemia 07/20/2020  . CVA (cerebral vascular accident) (HCC) 07/20/2020  . Essential hypertension 07/20/2020  . Hypothyroidism 07/20/2020   Past Medical History: History reviewed. No pertinent past medical history. Past Surgical History: History reviewed. No pertinent surgical history. HPI:  Patient is an 86 y.o. female with PMH significant for HTN, hypothyroidism, OA, HLD, glaucoma. She presented to [facility] with sypmtoms of stroke. MRI on 9/15 positive for acute small vessel infarct in the posterior right corona radiata and lentiform. Repeat MRI brain 9/18: Increased intensity and mildly expanded diffusion restriction from the right posterior corona radiata through the right lentiform since 09/15.   Assessment / Plan / Recommendation Clinical Impression  Pt participated in speech/language/cognition evaluation. She denied any baseline or acute deficits in speech, language, or cognition. She stated that she has a tenth-grade education and is currently retired but independently managed her medications and finances PTA. [facility] Mental Status Examination was completed to evaluate the pt's cognitive-linguistic skills. She achieved a score of 14/30 which is below the normal limits of 25 or more out of 30. She exhibited difficulty in the areas of awareness, memory, reasoning, complex problem solving, and executive function. Motor speech skills were [facility] but repetition was often needed due to pt's sup-optimal vocal intensity and intermittent hypernasality; pt stated that both of these qualities are her baseline. Skilled SLP services are clinically indicated at this time to improve cognitive-linguistic function.    SLP  Assessment  SLP Recommendation/Assessment: Patient needs continued Speech Lanaguage Pathology Services SLP Visit Diagnosis: Cognitive communication deficit (R41.841)    Follow Up Recommendations  Inpatient Rehab    Frequency and Duration min 2x/week  2 weeks      SLP Evaluation Cognition  Overall Cognitive Status: Impaired/Different from baseline Arousal/Alertness: Awake/alert Orientation Level: Oriented X4 Attention: Focused;Sustained Focused Attention: Appears intact Sustained Attention: Appears intact Memory: Impaired Memory Impairment: Retrieval deficit;Decreased recall of new information (Immediate: 3/5; delayed: 0/5 ) Awareness: Impaired Awareness Impairment: Emergent impairment Problem Solving: Impaired Problem Solving Impairment: Verbal complex Executive Function: Reasoning;Sequencing;Organizing Sequencing: Impaired Sequencing Impairment: Verbal complex (Clock drawing: 0/4) Organizing: Impaired Organizing Impairment: Verbal complex (Backward digit span: 0/3)       Comprehension  Auditory Comprehension Overall Auditory Comprehension: Appears within functional limits for tasks assessed Yes/No Questions: Within Functional Limits Commands: Within Functional Limits    Expression Expression Primary Mode of Expression: Verbal Verbal Expression Overall Verbal Expression: Appears within functional limits for tasks assessed Initiation: No impairment Repetition: No impairment Naming: No impairment Pragmatics: No impairment   Oral / Motor  Oral Motor/Sensory Function Overall Oral Motor/Sensory Function: Within functional limits Motor Speech Overall Motor Speech: Appears within functional limits for tasks assessed Respiration: Within functional limits Phonation: Normal Resonance: Hypernasality Articulation: Within functional limitis Intelligibility: Intelligible Motor Planning: Witnin functional limits Motor Speech Errors: Not applicable   Gavin Telford I. Vear Clock, MS,  CCC-SLP Acute Rehabilitation Services Office number [phone] Pager [phone]                    Scheryl Marten 07/24/2020, 5:14 PM

## 2020-07-24 NOTE — Plan of Care (Signed)
  Problem: Education: Goal: Knowledge of disease or condition will improve Outcome: Progressing Goal: Knowledge of secondary prevention will improve Outcome: Progressing Goal: Knowledge of patient specific risk factors addressed and post discharge goals established will improve Outcome: Progressing Goal: Individualized Educational Video(s) Outcome: Progressing   Problem: Coping: Goal: Will verbalize positive feelings about self Outcome: Progressing Goal: Will identify appropriate support needs Outcome: Progressing   Problem: Health Behavior/Discharge Planning: Goal: Ability to manage health-related needs will improve Outcome: Progressing   Problem: Self-Care: Goal: Ability to participate in self-care as condition permits will improve Outcome: Progressing Goal: Verbalization of feelings and concerns over difficulty with self-care will improve Outcome: Progressing Goal: Ability to communicate needs accurately will improve Outcome: Progressing   Problem: Ischemic Stroke/TIA Tissue Perfusion: Goal: Complications of ischemic stroke/TIA will be minimized Outcome: Progressing   Problem: Education: Goal: Knowledge of General Education information will improve Description: Including pain rating scale, medication(s)/side effects and non-pharmacologic comfort measures Outcome: Progressing   Problem: Health Behavior/Discharge Planning: Goal: Ability to manage health-related needs will improve Outcome: Progressing   Problem: Clinical Measurements: Goal: Ability to maintain clinical measurements within normal limits will improve Outcome: Progressing Goal: Will remain free from infection Outcome: Progressing Goal: Diagnostic test results will improve Outcome: Progressing Goal: Respiratory complications will improve Outcome: Progressing Goal: Cardiovascular complication will be avoided Outcome: Progressing   Problem: Activity: Goal: Risk for activity intolerance will  decrease Outcome: Progressing   Problem: Nutrition: Goal: Adequate nutrition will be maintained Outcome: Progressing   Problem: Coping: Goal: Level of anxiety will decrease Outcome: Progressing   Problem: Elimination: Goal: Will not experience complications related to bowel motility Outcome: Progressing Goal: Will not experience complications related to urinary retention Outcome: Progressing   Problem: Pain Managment: Goal: General experience of comfort will improve Outcome: Progressing   Problem: Safety: Goal: Ability to remain free from injury will improve Outcome: Progressing   Problem: Skin Integrity: Goal: Risk for impaired skin integrity will decrease Outcome: Progressing   

## 2020-07-25 DIAGNOSIS — I639 Cerebral infarction, unspecified: Secondary | ICD-10-CM

## 2020-07-25 DIAGNOSIS — G8194 Hemiplegia, unspecified affecting left nondominant side: Secondary | ICD-10-CM

## 2020-07-25 NOTE — Consult Note (Signed)
Physical Medicine and Rehabilitation Consult Reason for Consult: Left side weakness Referring Physician: [facility]   HPI: Nicole [name] is a 86 y.o. right-handed female with history of hypertension as well as hypothyroidism and hyperlipidemia.  Per chart review patient lives with daughter.  Two-level home bed and bath upstairs.  Independent with ADLs and still driving.  Presented 07/20/2020 with left-sided weakness.  CT/MRI showed positive acute small vessel infarct in the posterior right corona radiata and lentiform.  No associated hemorrhage or mass-effect.  CT angiogram of head and neck no emergent large vessel occlusion or high-grade stenosis.  Echocardiogram with ejection fraction of 60 to 65% no wall motion abnormalities.  Admission chemistries potassium 3.3, glucose 139, WBC 10,800, urine drug screen negative, SARS coronavirus negative.  Currently maintained on aspirin/Plavix for CVA prophylaxis as well as BMS investigational med.  Latest follow-up MRI 07/23/2020 showed slight increase intensity and mildly expanded diffusion restriction from the right posterior corona radiata throughout the right lentiform as compared to prior scan of 07/20/2020.  No new areas of ischemia.  Tolerating a regular consistency diet.  Therapy evaluations completed with recommendations of physical medicine rehab consult.   Review of Systems  Constitutional: Negative for chills and fever.  HENT: Negative for hearing loss.   Eyes: Negative for blurred vision and double vision.  Respiratory: Negative for cough and shortness of breath.   Cardiovascular: Negative for chest pain, palpitations and leg swelling.  Gastrointestinal: Positive for constipation. Negative for heartburn, nausea and vomiting.  Genitourinary: Negative for dysuria, flank pain and hematuria.  Musculoskeletal: Positive for joint pain and myalgias.  Skin: Negative for rash.  Neurological: Positive for weakness.  All other systems reviewed and are  negative.  History reviewed. No pertinent past medical history. History reviewed. No pertinent surgical history. Family History  Problem Relation Age of Onset  . Diabetes Mother   . Diabetes Other   . Hypertension Other    Social History:  reports that she has never smoked. She has never used smokeless tobacco. She reports that she does not drink alcohol and does not use drugs. Allergies: No Known Allergies Medications Prior to Admission  Medication Sig Dispense Refill  . amLODipine (NORVASC) 10 MG tablet Take 10 mg by mouth daily.  11  . aspirin 81 MG EC tablet Take 81 mg by mouth daily.     . cetirizine (ZYRTEC) 10 MG tablet Take 10 mg by mouth daily as needed for allergies or rhinitis.    . Cholecalciferol (VITAMIN D-3 PO) Take 1 capsule by mouth daily.    . Cyanocobalamin (VITAMIN B-12 PO) Take 1 tablet by mouth daily.    . diclofenac Sodium (VOLTAREN) 1 % GEL Apply 2 g topically 4 (four) times daily as needed (to affected areas- for pain).     . hydrochlorothiazide (HYDRODIURIL) 25 MG tablet Take 25 mg by mouth daily.  2  . levothyroxine (SYNTHROID) 50 MCG tablet Take 50 mcg by mouth daily before breakfast.     . Multiple Vitamins-Minerals (CENTRUM SILVER ULTRA WOMENS) TABS Take 1 tablet by mouth daily with breakfast.    . oxybutynin (DITROPAN-XL) 5 MG 24 hr tablet Take 5 mg by mouth at bedtime.     . pravastatin (PRAVACHOL) 40 MG tablet Take 40 mg by mouth daily.   2  . PROAIR HFA 108 (90 BASE) MCG/ACT inhaler Inhale 1 puff into the lungs every 4 (four) hours as needed for wheezing or shortness of breath.   0  .  cefdinir (OMNICEF) 300 MG capsule Take 300 mg by mouth 2 (two) times daily. (Patient not taking: Reported on 07/20/2020)  0  . celecoxib (CELEBREX) 200 MG capsule  (Patient not taking: Reported on 07/20/2020)    . celecoxib (CELEBREX) 200 MG capsule Take 200 mg by mouth daily. (Patient not taking: Reported on 07/20/2020)    . Diclofenac Sodium CR 100 MG 24 hr tablet  (Patient  not taking: Reported on 07/20/2020)  1  . ibandronate (BONIVA) 150 MG tablet Take by mouth. (Patient not taking: Reported on 07/20/2020)    . levothyroxine (SYNTHROID, LEVOTHROID) 25 MCG tablet Take 25 mcg by mouth daily. (Patient not taking: Reported on 07/20/2020)  3    Home: Home Living Family/patient expects to be discharged to:: Private residence Living Arrangements: Children (Daughter) Available Help at Discharge: Family, Available 24 hours/day Type of Home: House Home Access: Stairs to enter [employer] of Steps: 1 Entrance Stairs-Rails: None Home Layout: Two level, Able to live on main level with bedroom/bathroom, Bed/bath upstairs, Full bath on main level Alternate Level Stairs-Number of Steps: 14 Alternate Level Stairs-Rails: Left Bathroom Shower/Tub: Engineer, manufacturing systems: Standard Bathroom Accessibility: Yes Home Equipment: None Additional Comments: [name] lives with her and works from home part time. She does about 2 wks in office/month. She has ability to work from home for ~6 weeks at a time.   Lives With: Daughter  Functional History: Prior Function Level of Independence: Independent Comments: Pt sleeps in upstairs bedroom and daughter sleeps downstairs. Full bath on both levels. Pt stands in shower and is independent with ADL's. She is still driving, shopping, cooking, cleaning. Pt was not using AD for mobility  PTA. Functional Status:  Mobility: Bed Mobility Overal bed mobility: Needs Assistance Bed Mobility: Supine to Sit Supine to sit: Mod assist, HOB elevated Sit to supine: Min assist General bed mobility comments: Mod A to elevate turnk and manage LLE Transfers Overall transfer level: Needs assistance Equipment used: Rolling walker (2 wheeled) Transfers: Sit to/from Stand, [employer] Transfers Sit to Stand: Mod assist Stand pivot transfers: Max assist General transfer comment: Mod A to power up into standing with L knee blocked.  Performing stand pivot to RW, blocking left knee and Max A to maintain balance and keep LUE on RW Ambulation/Gait General Gait Details: Attempted lateral sidestepping at EOB, pt unable to sequence stepping safely attempting to cross Lt over Rt. Lt LE buckling with Rt stepping. Returned to sit EOB.     ADL: ADL Overall ADL's : Needs assistance/impaired Eating/Feeding: Minimal assistance, Sitting Grooming: Minimal assistance, Sitting Upper Body Bathing: Minimal assistance, Sitting Lower Body Bathing: Maximal assistance, Sit to/from stand Upper Body Dressing : Moderate assistance, Sitting Lower Body Dressing: Maximal assistance, Sit to/from stand Lower Body Dressing Details (indicate cue type and reason): Pt highly motivated. Pt requiring Max A for donning socks. Providing hand over hand on L hand to maintain grasp strength. Pt requiring assistance to manage LLE.  Toilet Transfer: Maximal assistance, Stand-pivot (simulated to recliner) Statistician Details (indicate cue type and reason): Max A to block L knee  Toileting- Clothing Manipulation and Hygiene: Minimal assistance, Maximal assistance, Sitting/lateral lean Toileting - Clothing Manipulation Details (indicate cue type and reason): Min A for peri care with lateral lean. sit<>stand and daughter assisting to manage underwear Functional mobility during ADLs: Maximal assistance, Rolling walker (stand pivot) General ADL Comments: Pt very motivated. Presenting with   Cognition: Cognition Overall Cognitive Status: Impaired/Different from baseline Arousal/Alertness: Awake/alert Orientation  Level: Oriented X4 Attention: Focused, Sustained Focused Attention: Appears intact Sustained Attention: Appears intact Memory: Impaired Memory Impairment: Retrieval deficit, Decreased recall of new information (Immediate: 3/5; delayed: 0/5 ) Awareness: Impaired Awareness Impairment: Emergent impairment Problem Solving: Impaired Problem Solving  Impairment: Verbal complex Executive Function: Reasoning, Sequencing, Organizing Sequencing: Impaired Sequencing Impairment: Verbal complex (Clock drawing: 0/4) Organizing: Impaired Organizing Impairment: Verbal complex (Backward digit span: 0/3) Cognition Arousal/Alertness: Awake/alert Behavior During Therapy: WFL for tasks assessed/performed Overall Cognitive Status: Impaired/Different from baseline Area of Impairment: Problem solving, Awareness, Following commands Following Commands: Follows one step commands with increased time, Follows multi-step commands inconsistently, Follows multi-step commands with increased time Awareness: Emergent Problem Solving: Slow processing, Requires verbal cues, Requires tactile cues General Comments: Pt requiring increased time for processing. Pt very motivated to particiapte in ADLs and therapy. Pt also present with decreased awareness of L side.   Blood pressure 130/70, pulse 80, temperature 98.2 F (36.8 C), temperature source Oral, resp. rate 18, height 5\' 5"  (1.651 m), weight 69.1 kg, SpO2 94 %. Physical Exam General: Alert and oriented x 2 (not time), No apparent distress HEENT: Head is normocephalic, atraumatic, PERRLA, EOMI, sclera anicteric, oral mucosa pink and moist, dentition intact, ext ear canals clear,  Neck: Supple without JVD or lymphadenopathy Heart: Reg rate and rhythm. No murmurs rubs or gallops Chest: CTA bilaterally without wheezes, rales, or rhonchi; no distress Abdomen: Soft, non-tender, non-distended, bowel sounds positive. Extremities: No clubbing, cyanosis, or edema. Pulses are 2+ Skin: Clean and intact without signs of breakdown Neuro: Patient is alert in no acute distress.  Makes eye contact with examiner follows commands.  Provides her name and age.  She does display some decrease in awareness of her deficits. Left sided facial droop.  LUE 2/5, LLE 2/5 HF and KE and 0/5 DF/PF Sensation decreased on the left side.  Psych:  Pt's affect is appropriate. Pt is cooperative  Results for orders placed or performed during the hospital encounter of 07/20/20 (from the past 24 hour(s))  CBC with Differential/Platelet     Status: None   Collection Time: 07/24/20  6:17 AM  Result Value Ref Range   WBC 8.0 4.0 - 10.5 K/uL   RBC 4.14 3.87 - 5.11 MIL/uL   Hemoglobin 12.6 12.0 - 15.0 g/dL   HCT 07/26/20 36 - 46 %   MCV 95.7 80.0 - 100.0 fL   MCH 30.4 26.0 - 34.0 pg   MCHC 31.8 30.0 - 36.0 g/dL   RDW 41.6 60.6 - 30.1 %   Platelets 253 150 - 400 K/uL   nRBC 0.0 0.0 - 0.2 %   Neutrophils Relative % 61 %   Neutro Abs 4.8 1.7 - 7.7 K/uL   Lymphocytes Relative 30 %   Lymphs Abs 2.4 0.7 - 4.0 K/uL   Monocytes Relative 6 %   Monocytes Absolute 0.5 0 - 1 K/uL   Eosinophils Relative 2 %   Eosinophils Absolute 0.2 0 - 0 K/uL   Basophils Relative 1 %   Basophils Absolute 0.1 0 - 0 K/uL   Immature Granulocytes 0 %   Abs Immature Granulocytes 0.02 0.00 - 0.07 K/uL   CT HEAD WO CONTRAST  Result Date: 07/23/2020 CLINICAL DATA:  Stroke follow-up EXAM: CT HEAD WITHOUT CONTRAST TECHNIQUE: Contiguous axial images were obtained from the base of the skull through the vertex without intravenous contrast. COMPARISON:  MRI head 07/22/2020, 07/20/2020 FINDINGS: Brain: Hypodensity right posterior lentiform nucleus again noted compatible with recent infarct. No change in  size. No new area of infarct or hemorrhage. Mild atrophy. Chronic microvascular ischemic changes throughout the white matter. Chronic infarct right internal capsule anteriorly. Negative for mass lesion. Vascular: Negative for hyperdense vessel Skull: Negative Sinuses/Orbits: Paranasal sinuses clear. Bilateral cataract extraction Other: None IMPRESSION: Acute infarct in the right lentiform nucleus unchanged from recent MRI. No acute hemorrhage Atrophy and chronic microvascular ischemic change in the white matter. Electronically Signed   By: Marlan Palauharles  Clark M.D.   On: 07/23/2020 13:07    MR BRAIN WO CONTRAST  Result Date: 07/23/2020 CLINICAL DATA:  86 year old female with code stroke presentation, left side weakness, small vessel ischemia in the posterior right corona radiata and lentiform diagnosed on MRI 07/20/2020. EXAM: LIMITED MRI HEAD WITHOUT CONTRAST TECHNIQUE: Diffusion-weighted imaging only requested by neurology. COMPARISON:  Brain MRI yesterday, 07/20/2020. FINDINGS: Axial and coronal DWI. Restricted diffusion has increased in intensity since 07/20/2020 in the affected area from the posterior right corona radiata through the right lentiform. Compare series 7, image 53 today to series 7, image 53 on 07/20/2020. The area of involvement has mildly expanded. But there is no new restricted diffusion elsewhere. No intracranial mass effect. IMPRESSION: Increased intensity and mildly expanded diffusion restriction from the right posterior corona radiata through the right lentiform since 07/20/2020. No new areas of ischemia.  No intracranial mass effect. Electronically Signed   By: Odessa FlemingH  Hall M.D.   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 Does the need for close, 24 hr/day medical supervision in concert with the patient's rehab needs make it unreasonable for this patient to be served in a less intensive setting? Yes 2. Co-Morbidities requiring supervision/potential complications: HLD, HTN, impaired orientation, hypothyroidism, hyperglycemia 3. Due to bladder management, bowel management, safety, skin/wound care, disease management, medication administration, pain management and patient education, does the patient require 24 hr/day rehab nursing? Yes 4. Does the patient require coordinated care of a physician, rehab nurse, therapy disciplines of PT, OT, SLP to address physical and functional deficits in the context of the above medical diagnosis(es)? Yes Addressing deficits in the following areas: balance, endurance,  locomotion, strength, transferring, bowel/bladder control, bathing, dressing, feeding, grooming, toileting, cognition, speech and psychosocial support 5. Can the patient actively participate in an intensive therapy program of at least 3 hrs of therapy per day at least 5 days per week? Yes 6. The potential for patient to make measurable gains while on inpatient rehab is excellent 7. Anticipated functional outcomes upon discharge from inpatient rehab are min assist  with PT, min assist with OT, supervision with SLP. 8. Estimated rehab length of stay to reach the above functional goals is: 14-18 days 9. Anticipated discharge destination: Home 10. Overall Rehab/Functional Prognosis: excellent  RECOMMENDATIONS: This patient's condition is appropriate for continued rehabilitative care in the following setting: CIR Patient has agreed to participate in recommended program. Yes Note that insurance prior authorization may be required for reimbursement for recommended care.  Comment: Patient would be excellent CIR candidate if we can confirm 24/7 family supervision. Thank you for this consult. Admission coordinator to follow.   I have personally performed a face to face diagnostic evaluation, including, but not limited to relevant history and physical exam findings, of this patient and developed relevant assessment and plan.  Additionally, I have reviewed and concur with the physician assistant's documentation above.  Sula SodaKrutika Kennth Vanbenschoten, MD  Mcarthur Rossettianiel J Angiulli, PA-C 07/25/2020

## 2020-07-25 NOTE — PMR Pre-admission (Signed)
PMR Admission Coordinator Pre-Admission Assessment  Patient: Nicole HamburgerMary J Awan is an 86 y.o., female MRN: [id number] DOB: 10/17/1930 Height: 5\' 5"  (165.1 cm) Weight: 69.1 kg              Insurance Information HMO: yes    PPO:      PCP:      IPA:      80/20:      OTHER:  PRIMARY: Blue Medicare      Policy#: [id number]      Subscriber: pt CM Name: Victorino DikeJennifer      Phone#: [phone]     Fax#: [phone] Pre-Cert#: TBD approved for 7 days     Employer:  Benefits:  Phone #: [phone]     Name: 9/21 Eff. Date: 11/06/2019     Deduct: none      Out of Pocket Max: $4200      Life Max: none  CIR: $335 co pay per admission      SNF: no copays; limited by medcial neccesity Outpatient: $40 Co pay per visit     Co-Pay: visits per medcial neccesity Home Health: 100%      Co-Pay: visits limited by medcial neccesity DME: 80%     Co-Pay: 20% Providers: in network  SECONDARY: none      Policy#:       Phone#:   Artistinancial Counselor:       Phone#:   The Data processing manager"Data Collection Information Summary" for patients in Inpatient Rehabilitation Facilities with attached "Privacy Act Statement-Health Care Records" was provided and verbally reviewed with: Patient and Family  Emergency Contact Information Contact Information    Name Relation Home Work Mobile   Ailes,[name] Daughter [phone]       Current Medical History  Patient Admitting Diagnosis: CVA  History of Present Illness:  Nicole [name] is a 86 y.o. right-handed female with history of hypertension as well as hypothyroidism and hyperlipidemia.    Independent with ADLs and still driving.  Presented 07/20/2020 with left-sided weakness.  CT/MRI showed positive acute small vessel infarct in the posterior right corona radiata and lentiform.  No associated hemorrhage or mass-effect.  CT angiogram of head and neck no emergent large vessel occlusion or high-grade stenosis.  Echocardiogram with ejection fraction of 60 to 65% no wall motion abnormalities.  Admission  chemistries potassium 3.3, glucose 139, WBC 10,800, urine drug screen negative, SARS coronavirus negative.  Currently maintained on aspirin/Plavix for CVA prophylaxis as well as BMS investigational med.  Latest follow-up MRI 07/23/2020 showed slight increase intensity and mildly expanded diffusion restriction from the right posterior corona radiata throughout the right lentiform as compared to prior scan of 07/20/2020.  No new areas of ischemia.  Tolerating a regular consistency diet.   Complete NIHSS TOTAL: 11 Glasgow Coma Scale Score: 15  Past Medical History  History reviewed. No pertinent past medical history.  Family History  family history includes Diabetes in her mother and another family member; Hypertension in an other family member.  Prior Rehab/Hospitalizations:  Has the patient had prior rehab or hospitalizations prior to admission? Yes  Has the patient had major surgery during 100 days prior to admission? No  Current Medications   Current Facility-Administered Medications:  .  acetaminophen (TYLENOL) tablet 650 mg, 650 mg, Oral, Q4H PRN, 650 mg at 07/26/20 0827 **OR** acetaminophen (TYLENOL) 160 MG/5ML solution 650 mg, 650 mg, Per Tube, Q4H PRN **OR** acetaminophen (TYLENOL) suppository 650 mg, 650 mg, Rectal, Q4H PRN, Doutova, Anastassia, MD .  albuterol (VENTOLIN  HFA) 108 (90 Base) MCG/ACT inhaler 1 puff, 1 puff, Inhalation, Q4H PRN, Doutova, Anastassia, MD .  atorvastatin (LIPITOR) tablet 80 mg, 80 mg, Oral, Daily, Roberto Scales D, MD, 80 mg at 07/26/20 1000 .  levothyroxine (SYNTHROID) tablet 50 mcg, 50 mcg, Oral, QAC breakfast, Doutova, Anastassia, MD, 50 mcg at 07/26/20 4098 .  oxybutynin (DITROPAN-XL) 24 hr tablet 5 mg, 5 mg, Oral, QHS, Doutova, Anastassia, MD, 5 mg at 07/25/20 2223 .  polyethylene glycol (MIRALAX / GLYCOLAX) packet 17 g, 17 g, Oral, BID, Roberto Scales D, MD, 17 g at 07/25/20 2223 .  senna-docusate (Senokot-S) tablet 2 tablet, 2 tablet, Oral, BID, Roberto Scales D, MD, 2 tablet at 07/25/20 2223 .  STUDY - AXIOMATIC - aspirin 100mg  (PI - Sethi), 100 mg, Oral, QAC breakfast, , MD, 100 mg at 07/25/20 0814 .  STUDY - AXIOMATIC - 07/27/20 or placebo (PI - Sethi), 2 capsule, Oral, BID, [id number], MD, 2 capsule at 07/25/20 2223 .  STUDY - AXIOMATIC - clopidogrel 75 mg (PI - Sethi), 75 mg, Oral, Q24H, 2224 D, MD, 75 mg at 07/25/20 0815 .  traMADol (ULTRAM) tablet 50 mg, 50 mg, Oral, Q6H PRN, 07/27/20 D, MD, 50 mg at 07/26/20 1000  Patients Current Diet:  Diet Order            Diet Heart Room service appropriate? Yes; Fluid consistency: Thin  Diet effective now                 Precautions / Restrictions Precautions Precautions: Fall Precaution Comments: Left side weakness. Inattention to L Restrictions Weight Bearing Restrictions: No   Has the patient had 2 or more falls or a fall with injury in the past year?No  Prior Activity Level Community (5-7x/wk): Independent and driving  Prior Functional Level Prior Function Level of Independence: Independent Comments: Pt sleeps in upstairs bedroom and daughter sleeps downstairs. Full bath on both levels. Pt stands in shower and is independent with ADL's. She is still driving, shopping, cooking, cleaning. Pt was not using AD for mobility  PTA.  Self Care: Did the patient need help bathing, dressing, using the toilet or eating?  Independent  Indoor Mobility: Did the patient need assistance with walking from room to room (with or without device)? Independent  Stairs: Did the patient need assistance with internal or external stairs (with or without device)? Independent  Functional Cognition: Did the patient need help planning regular tasks such as shopping or remembering to take medications? Independent  Home Assistive Devices / Equipment Home Assistive Devices/Equipment: None Home Equipment: None  Prior Device Use: Indicate devices/aids used by the  patient prior to current illness, exacerbation or injury? None of the above  Current Functional Level Cognition  Arousal/Alertness: Awake/alert Overall Cognitive Status: No family/caregiver present to determine baseline cognitive functioning Orientation Level: Oriented X4 Following Commands: Follows one step commands with increased time, Follows multi-step commands inconsistently, Follows multi-step commands with increased time General Comments: Pt requiring increased time for processing. Pt very motivated to particiapte in therapy session. Pt also present with decreased awareness of L side.  Attention: Focused, Sustained Focused Attention: Appears intact Sustained Attention: Appears intact Memory: Impaired Memory Impairment: Retrieval deficit, Decreased recall of new information (Immediate: 3/5; delayed: 0/5 ) Awareness: Impaired Awareness Impairment: Emergent impairment Problem Solving: Impaired Problem Solving Impairment: Verbal complex Executive Function: Reasoning, Sequencing, Organizing Sequencing: Impaired Sequencing Impairment: Verbal complex (Clock drawing: 0/4) Organizing: Impaired Organizing Impairment: Verbal complex (Backward digit  span: 0/3)    Extremity Assessment (includes Sensation/Coordination)  Upper Extremity Assessment: LUE deficits/detail LUE Deficits / Details: Poor grasp strength, pinch strength, and FM/GM coorindation. Pt able to bring hand to face. Poor strength over 80*.  LUE Coordination: decreased fine motor, decreased gross motor  Lower Extremity Assessment: Defer to PT evaluation RLE Deficits / Details: pt with 3+/5 for hip flexion, 4/5 for knee flex/ext, and ankle dorsi/plantarflexion.  RLE Sensation: WNL RLE Coordination: WNL LLE Deficits / Details: pt with 3-/5 for hip flexion, 4/5 for knee flex/ext, and ankle dorsi/plantarflexion.  LLE Sensation: WNL LLE Coordination: decreased gross motor (poor ability to sequence stepping in standing.)     ADLs  Overall ADL's : Needs assistance/impaired Eating/Feeding: Minimal assistance, Sitting Grooming: Minimal assistance, Sitting Upper Body Bathing: Minimal assistance, Sitting Lower Body Bathing: Maximal assistance, Sit to/from stand Upper Body Dressing : Moderate assistance, Sitting Lower Body Dressing: Maximal assistance, Sit to/from stand Lower Body Dressing Details (indicate cue type and reason): Pt highly motivated. Pt requiring Max A for donning socks. Providing hand over hand on L hand to maintain grasp strength. Pt requiring assistance to manage LLE.  Toilet Transfer: Maximal assistance, Stand-pivot (simulated to recliner) Statistician Details (indicate cue type and reason): Max A to block L knee  Toileting- Clothing Manipulation and Hygiene: Minimal assistance, Maximal assistance, Sitting/lateral lean Toileting - Clothing Manipulation Details (indicate cue type and reason): Min A for peri care with lateral lean. sit<>stand and daughter assisting to manage underwear Functional mobility during ADLs: Maximal assistance, Rolling walker (stand pivot) General ADL Comments: Pt very motivated. Presenting with     Mobility  Overal bed mobility: Needs Assistance Bed Mobility: Rolling, Sidelying to Sit, Sit to Sidelying Rolling: Mod assist (to right) Sidelying to sit: Mod assist, HOB elevated (after legs over EOB, elevated HOB to 25 and pt pushed up) Supine to sit: Mod assist, HOB elevated, +2 for safety/equipment Sit to supine: Min assist Sit to sidelying: Mod assist General bed mobility comments: rolling requires assist to bring LUE across, bend LLE, pt uses RUE on rail    Transfers  Overall transfer level: Needs assistance Equipment used: Rolling walker (2 wheeled) Transfer via Lift Equipment: Stedy Transfers: Lateral/Scoot Transfers Sit to Stand: Mod assist, +2 physical assistance, +2 safety/equipment Stand pivot transfers: Max assist  Lateral/Scoot Transfers: Max  assist General transfer comment: scooting to her right along EOB x 4     Ambulation / Gait / Stairs / Wheelchair Mobility  Ambulation/Gait General Gait Details: Did not attempt - pre-gait only of weight shifting in [place] before pt fatigued.     Posture / Balance Dynamic Sitting Balance Sitting balance - Comments: initial posterior and left lean up to mod assist; progressed to minguard in midline up to 20 sec Balance Overall balance assessment: Needs assistance Sitting-balance support: Feet supported, Single extremity supported Sitting balance-Leahy Scale: Poor Sitting balance - Comments: initial posterior and left lean up to mod assist; progressed to minguard in midline up to 20 sec Postural control: Posterior lean, Left lateral lean Standing balance support: During functional activity, Bilateral upper extremity supported Standing balance-Leahy Scale: Zero Standing balance comment: Required max assist +1 for standing balance    Special needs/care consideration Designated visitor is daughter, [name]   Previous Home Environment  Living Arrangements:  (lives with daughter)  Lives With: Daughter Available Help at Discharge:  (daughter can work remote; [employer] for [employer]/ They work ) Type of Home: [employer] Layout: Two level, Able to live on  main level with bedroom/bathroom, Bed/bath upstairs, Full bath on main level Alternate Level Stairs-Rails: Left Alternate Level Stairs-Number of Steps: 14 Home Access: Stairs to enter Entrance Stairs-Rails: None Entrance Stairs-Number of Steps: 1 Bathroom Shower/Tub: Associate Professor: Yes Home Care Services: No Additional Comments: [name] lives with her and works from home part time. She does about 2 wks in office/month. She has ability to work from home for ~6 weeks at a time.   Discharge Living Setting Plans for Discharge Living Setting: Patient's home, Lives with (comment)  (daughter) Type of Home at Discharge: House Discharge Home Layout: Able to live on main level with bedroom/bathroom, Two level Alternate Level Stairs-Rails: Left Alternate Level Stairs-Number of Steps: 14 Discharge Home Access: Stairs to enter Entrance Stairs-Rails: None Entrance Stairs-Number of Steps: 1 Discharge Bathroom Shower/Tub: Tub/shower unit Discharge Bathroom Toilet: Standard Discharge Bathroom Accessibility: Yes How Accessible: Accessible via walker Does the patient have any problems obtaining your medications?: No  Social/Family/Support Systems Contact Information: daughter, [name] Anticipated Caregiver: daughter and hired caregivers Anticipated Industrial/product designer Information: [phone] Ability/Limitations of Caregiver: daughter works hybrid 2 week at a time in [place] for [employer]. Can make arrangements to work from home fulltime Caregiver Availability: 24/7 Discharge Plan Discussed with Primary Caregiver: Yes Is Caregiver In Agreement with Plan?: No Does Caregiver/Family have Issues with Lodging/Transportation while Pt is in Rehab?: No  Goals Patient/Family Goal for Rehab: min wiht PT and OT, supervision with SLP Expected length of stay: [facility] 14 to 18 days Pt/Family Agrees to Admission and willing to participate: Yes Program Orientation Provided & Reviewed with Pt/Caregiver Including Roles  & Responsibilities: Yes  Decrease burden of Care through IP rehab admission: n/a  Possible need for SNF placement upon discharge:not anticipated  Patient Condition: This patient's condition remains as documented in the consult dated 07/25/2020, in which the Rehabilitation Physician determined and documented that the patient's condition is appropriate for intensive rehabilitative care in an inpatient rehabilitation facility. Will admit to inpatient rehab today.  Preadmission Screen Completed By:  Clois Dupes, RN, 07/26/2020 2:08  PM ______________________________________________________________________   Discussed status with Dr. Berline Chough on 07/26/2020 at  1408 and received approval for admission today.  Admission Coordinator:  Clois Dupes, time 8850 Date 07/26/2020

## 2020-07-25 NOTE — Progress Notes (Addendum)
Inpatient Rehabilitation Admissions Coordinator  Inpatient rehab consult received. I met with patient at bedside for assessment. I have left a voicemail for pt's daughter to contact me to discuss rehab venue options.  Danne Baxter, RN, MSN Rehab Admissions Coordinator [phone] 07/25/2020 2:48 PM   I spoke with patient's 's daughter by phone and she can provide 24/7 assist and prefers [facility] rather than SNF. We discussed goals and expectations of a possible CIR admit. I will begin insurance authorization for a possible CIR admit.  Danne Baxter, RN, MSN Rehab Admissions Coordinator [phone] 07/25/2020 4:06 PM

## 2020-07-25 NOTE — Progress Notes (Addendum)
[facility]  PROGRESS NOTE  Nicole [name] ZOX:[id number] DOB: 11/17/1929 DOA: 07/20/2020 PCP: [name], [name], MD Admit date - 07/20/2020   Admitting Physician Therisa DoyneAnastassia Doutova, MD  Outpatient Primary MD for the patient is [name], [name], MD  LOS - 5 Brief Narrative   Nicole [name] is a 86 y.o. year old female with medical history significant for HTN, HLD thyroidism who presented on 07/20/2020 with new left-sided weakness for 1 day and was found to have acute CVA secondary to small vessel disease.  Hospital course complicated by worsening left-sided weakness with repeat MRI on 9/18 showing expansion of prior stroke with no new infarct    Subjective  No acute complaints.  Feels the Voltaren gel is helping with her left knee pain.  Still not able to do much activity with the left side of her body  A & P   Acute CVA with residual left-sided deficits (left hemiplegia).  Occurred in setting of hypertension and hyperlipidemia and MRI showed small vessel infarct in the posterior right corona radiata and lentiform CTA neck shows ganglionic level infarction (caudate, lentiform nuclei, internal capsule, insula) with no signs of large vessel occlusion or high-grade stenosis. Repeat MRI on 9/18 shows mildly expanded involvement of the right posterior corona radiata area.  Patient has left-sided hemiplegia. .  A1c 5.4, LDL 125.  TTE with preserved EF -Continue aspirin, Plavix 70 mg daily as well as BMS investigational meds, continue current regiment per clinical trial protocol  -Continue high intensity atorvastatin started in the hospital -Continue monitoring on telemetry -PT recommends CIR -CIR consulted -Follow-up in stroke clinic [facility] about 4 weeks   Hypertension, slightly elevated with SBP's in the 140s currently allowing permissive hypertension up to 220/120 as directed by neurology -Holding home BP medications  Hypothyroidism -Continue home  Synthroid  Hypokalemia mild, resolved likely in setting of home HCTZ magnesium within normal limits -Repleted with potassium -Monitor BMP  Leukocytosis, resolved.  Suspect stress related to acute CVA.  Has no localizing symptoms of infection.  Remains afebrile.  UA unremarkable.  Chest x-ray unremarkable. -Monitor CBC    Family Communication  : Daughter updated via phone on 9/19, will update today as well Code Status : Full  Disposition Plan  :  Patient is from home. Anticipated d/c date:  Medically stable, currently awaiting ability to go to CIR barriers to d/c or necessity for inpatient status:  PT recommends CIR, currently awaiting rehab assessment by admission coordinator to assist with planning disposition Consults  : Neurology  Procedures  : TTE 9/16 DVT Prophylaxis  : Aspirin, Plavix  Lab Results  Component Value Date   PLT 253 07/24/2020    Diet :  Diet Order            Diet Heart Room service appropriate? Yes; Fluid consistency: Thin  Diet effective now                  Inpatient Medications Scheduled Meds: . atorvastatin  80 mg Oral Daily  . levothyroxine  50 mcg Oral QAC breakfast  . oxybutynin  5 mg Oral QHS  . polyethylene glycol  17 g Oral BID  . senna-docusate  2 tablet Oral BID  . STUDY - AXIOMATIC - aspirin 100mg  (PI - Sethi)  100 mg Oral QAC breakfast  . STUDY - AXIOMATIC - [id number] or placebo (PI - Sethi)  2 capsule Oral BID  . STUDY - AXIOMATIC - clopidogrel 75 mg (PI -  Sethi)  75 mg Oral Q24H   Continuous Infusions:  PRN Meds:.acetaminophen **OR** acetaminophen (TYLENOL) oral liquid 160 mg/5 mL **OR** acetaminophen, albuterol  Antibiotics  :   Anti-infectives (From admission, onward)   None       Objective   Vitals:   07/24/20 2032 07/24/20 2330 07/25/20 0330 07/25/20 0730  BP: (!) 149/65 (!) 147/67 130/70 (!) 142/69  Pulse: 85 89 80 80  Resp: 18 18 18 18   Temp: 98.1 F (36.7 C) 98.4 F (36.9 C) 98.2 F (36.8 C) 98.2 F (36.8  C)  TempSrc: Oral Oral Oral Oral  SpO2: 95% 95% 94% 96%  Weight:      Height:        SpO2: 96 %  Wt Readings from Last 3 Encounters:  07/22/20 69.1 kg  07/06/15 70.8 kg     Intake/Output Summary (Last 24 hours) at 07/25/2020 1009 Last data filed at 07/25/2020 0903 Gross per 24 hour  Intake 240 ml  Output 850 ml  Net -610 ml    Physical Exam:     Awake Alert, Oriented X 3, Normal affect Left hemiplegia, no obvious facial droop, normal speech  [name].AT, Normal respiratory effort on room air, CTAB RRR,No Gallops,Rubs or new Murmurs,  +ve B.Sounds, Abd Soft, No tenderness, No rebound, guarding or rigidity. No Cyanosis, No new Rash or bruise   I have personally reviewed the following:   Data Reviewed:  CBC Recent Labs  Lab 07/20/20 2001 07/20/20 2008 07/22/20 0431 07/23/20 0132 07/24/20 0617  WBC 10.8*  --  10.7* 9.4 8.0  HGB 13.3 14.3 12.9 12.5 12.6  HCT 42.0 42.0 41.2 39.2 39.6  PLT 252  --  228 233 253  MCV 96.3  --  97.2 95.6 95.7  MCH 30.5  --  30.4 30.5 30.4  MCHC 31.7  --  31.3 31.9 31.8  RDW 12.2  --  12.5 12.4 12.5  LYMPHSABS 3.5  --  3.0 2.7 2.4  MONOABS 0.6  --  0.8 0.6 0.5  EOSABS 0.2  --  0.2 0.1 0.2  BASOSABS 0.1  --  0.1 0.1 0.1    Chemistries  Recent Labs  Lab 07/20/20 2001 07/20/20 2008 07/21/20 1412 07/22/20 0431 07/22/20 1213 07/23/20 0132  NA 137 139  --  137  --  138  K 3.3* 3.3*  --  3.4*  --  3.9  CL 100 102  --  105  --  105  CO2 25  --   --  23  --  24  GLUCOSE 139* 136*  --  112*  --  106*  BUN 16 19  --  8  --  13  CREATININE 0.92 0.80  --  0.66  --  0.85  CALCIUM 10.0  --   --  9.3  --  9.5  MG  --   --  2.1  --  2.1  --   AST 30  --   --   --   --   --   ALT 23  --   --   --   --   --   ALKPHOS 48  --   --   --   --   --   BILITOT 1.1  --   --   --   --   --    ------------------------------------------------------------------------------------------------------------------ No results for input(s): CHOL, HDL,  LDLCALC, TRIG, CHOLHDL, LDLDIRECT in the last 72 hours.  Lab Results  Component Value Date  HGBA1C 5.4 07/21/2020   ------------------------------------------------------------------------------------------------------------------ No results for input(s): TSH, T4TOTAL, T3FREE, THYROIDAB in the last 72 hours.  Invalid input(s): FREET3 ------------------------------------------------------------------------------------------------------------------ No results for input(s): VITAMINB12, FOLATE, FERRITIN, TIBC, IRON, RETICCTPCT in the last 72 hours.  Coagulation profile Recent Labs  Lab 07/20/20 2001  INR 0.9    No results for input(s): DDIMER in the last 72 hours.  Cardiac Enzymes No results for input(s): CKMB, TROPONINI, MYOGLOBIN in the last 168 hours.  Invalid input(s): CK ------------------------------------------------------------------------------------------------------------------ No results found for: BNP  Micro Results Recent Results (from the past 240 hour(s))  SARS Coronavirus 2 by RT PCR (hospital order, performed in [facility] hospital lab) Nasopharyngeal Nasopharyngeal Swab     Status: None   Collection Time: 07/21/20  2:16 AM   Specimen: Nasopharyngeal Swab  Result Value Ref Range Status   SARS Coronavirus 2 NEGATIVE NEGATIVE Final    Comment: (NOTE) SARS-CoV-2 target nucleic acids are NOT DETECTED.  The SARS-CoV-2 RNA is generally detectable in upper and lower respiratory specimens during the acute phase of infection. The lowest concentration of SARS-CoV-2 viral copies this assay can detect is 250 copies / mL. A negative result does not preclude SARS-CoV-2 infection and should not be used as the sole basis for treatment or other patient management decisions.  A negative result may occur with improper specimen collection / handling, submission of specimen other than nasopharyngeal swab, presence of viral mutation(s) within the areas targeted by this assay,  and inadequate number of viral copies (<250 copies / mL). A negative result must be combined with clinical observations, patient history, and epidemiological information.  Fact Sheet for Patients:   BoilerBrush.com.cy  Fact Sheet for Healthcare Providers: https://pope.com/  This test is not yet approved or  cleared by the [place] FDA and has been authorized for detection and/or diagnosis of SARS-CoV-2 by FDA under an Emergency Use Authorization (EUA).  This EUA will remain in effect (meaning this test can be used) for the duration of the COVID-19 declaration under Section 564(b)(1) of the Act, 21 U.S.C. section 360bbb-3(b)(1), unless the authorization is terminated or revoked sooner.  Performed at [facility], [address], [place], [place] [address]     Radiology Reports DG Chest 2 View  Result Date: 07/21/2020 CLINICAL DATA:  CVA symptoms EXAM: CHEST - 2 VIEW COMPARISON:  None. FINDINGS: Frontal and lateral views of the chest demonstrate an unremarkable cardiac silhouette. Dense atherosclerosis of the aortic arch. No airspace disease, effusion, or pneumothorax. No acute bony abnormalities. IMPRESSION: 1. No acute intrathoracic process. Electronically Signed   By: Sharlet Salina M.D.   On: 07/21/2020 02:18   CT HEAD WO CONTRAST  Result Date: 07/23/2020 CLINICAL DATA:  Stroke follow-up EXAM: CT HEAD WITHOUT CONTRAST TECHNIQUE: Contiguous axial images were obtained from the base of the skull through the vertex without intravenous contrast. COMPARISON:  MRI head 07/22/2020, 07/20/2020 FINDINGS: Brain: Hypodensity right posterior lentiform nucleus again noted compatible with recent infarct. No change in size. No new area of infarct or hemorrhage. Mild atrophy. Chronic microvascular ischemic changes throughout the white matter. Chronic infarct right internal capsule anteriorly. Negative for mass lesion. Vascular: Negative for  hyperdense vessel Skull: Negative Sinuses/Orbits: Paranasal sinuses clear. Bilateral cataract extraction Other: None IMPRESSION: Acute infarct in the right lentiform nucleus unchanged from recent MRI. No acute hemorrhage Atrophy and chronic microvascular ischemic change in the white matter. Electronically Signed   By: Marlan Palau M.D.   On: 07/23/2020 13:07   MR BRAIN  WO CONTRAST  Result Date: 07/23/2020 CLINICAL DATA:  86 year old female with code stroke presentation, left side weakness, small vessel ischemia in the posterior right corona radiata and lentiform diagnosed on MRI 07/20/2020. EXAM: LIMITED MRI HEAD WITHOUT CONTRAST TECHNIQUE: Diffusion-weighted imaging only requested by neurology. COMPARISON:  Brain MRI yesterday, 07/20/2020. FINDINGS: Axial and coronal DWI. Restricted diffusion has increased in intensity since 07/20/2020 in the affected area from the posterior right corona radiata through the right lentiform. Compare series 7, image 53 today to series 7, image 53 on 07/20/2020. The area of involvement has mildly expanded. But there is no new restricted diffusion elsewhere. No intracranial mass effect. IMPRESSION: Increased intensity and mildly expanded diffusion restriction from the right posterior corona radiata through the right lentiform since 07/20/2020. No new areas of ischemia.  No intracranial mass effect. Electronically Signed   By: Odessa Fleming M.D.   On: 07/23/2020 15:45   MR BRAIN WO CONTRAST  Result Date: 07/22/2020 CLINICAL DATA:  86 year old female. BMS AXIOMATIC STROKE STUDY PROTOCOL. EXAM: MRI HEAD WITHOUT CONTRAST TECHNIQUE: Multiplanar, multiecho pulse sequences of the brain and surrounding structures were obtained without intravenous contrast. COMPARISON:  Brain MRI 07/20/2020. FINDINGS: Curvilinear restricted diffusion redemonstrated from the posterior corona radiata to the posterior right lentiform. DWI appearance not significantly changed. No associated mass effect. No  acute or chronic blood products identified on T2 * GRE. No other restricted diffusion. Increased FLAIR hyperintensity from 2 days ago. Elsewhere stable gray and white matter signal on axial FLAIR and T1 weighted imaging. IMPRESSION: Expected evolution of recent small vessel infarct affecting the posterior right corona radiata through lentiform. No associated hemorrhage or mass effect. Electronically Signed   By: Odessa Fleming M.D.   On: 07/22/2020 21:38   MR BRAIN WO CONTRAST  Result Date: 07/20/2020 CLINICAL DATA:  86 year old female code stroke presentation, left side weakness. EXAM: MRI HEAD WITHOUT CONTRAST TECHNIQUE: Multiplanar, multiecho pulse sequences of the brain and surrounding structures were obtained without intravenous contrast. COMPARISON:  CT head, CTA head and neck earlier today. FINDINGS: Brain: Discontinuous curvilinear restricted diffusion is noted from the posterior right corona radiata tracking into the posterior right lentiform, best seen on series 7, image 53). Mild if any associated T2 and FLAIR hyperintensity. No hemorrhage or mass effect. No other restricted diffusion. Patchy and scattered bilateral cerebral white matter T2 and FLAIR hyperintensity elsewhere. No cortical encephalomalacia. No chronic cerebral blood products. Moderate T2 heterogeneity in the bilateral deep gray nuclei, most resembling lacunar infarcts in the basal ganglia. Brainstem and cerebellum are within normal limits for age. No midline shift, mass effect, evidence of mass lesion, ventriculomegaly, extra-axial collection or acute intracranial hemorrhage. Cervicomedullary junction and pituitary are within normal limits. Vascular: Major intracranial vascular flow voids are preserved. Skull and upper cervical spine: Partially visible multilevel cervical spine degeneration. Mild degenerative spinal stenosis suspected at C4-C5. Normal bone marrow signal. Sinuses/Orbits: Postoperative changes to both globes, otherwise negative.  Other: Visible internal auditory structures appear normal. Visible scalp and face appear negative. IMPRESSION: 1. Positive for acute small vessel infarct in the posterior right corona radiata and lentiform. No associated hemorrhage or mass effect. 2. Underlying chronic small vessel disease, including in the bilateral basal ganglia. 3. Mild degenerative spinal stenosis suspected at C4-C5. Electronically Signed   By: Odessa Fleming M.D.   On: 07/20/2020 22:47   ECHOCARDIOGRAM COMPLETE  Result Date: 07/21/2020    ECHOCARDIOGRAM REPORT   Patient Name:   KHAYLA KOPPENHAVER Date of Exam: 07/21/2020 Medical Rec #:  [id number]     Height:       64.0 in Accession #:    [id number]    Weight:       156.0 lb Date of Birth:  Jun 06, 1930      BSA:          1.760 m Patient Age:    86 years      BP:           143/79 mmHg Patient Gender: F             HR:           81 bpm. Exam Location:  Inpatient Procedure: 2D Echo Indications:    stroke 434.91  History:        Patient has no prior history of Echocardiogram examinations.                 Risk Factors:Hypertension.  Sonographer:    Celene Skeen RDCS (AE) Referring Phys: [id number] ANASTASSIA DOUTOVA IMPRESSIONS  1. Left ventricular ejection fraction, by estimation, is 60 to 65%. The left ventricle has normal function. The left ventricle has no regional wall motion abnormalities. Left ventricular diastolic parameters are indeterminate.  2. Right ventricular systolic function is normal. The right ventricular size is mildly enlarged.  3. The mitral valve is normal in structure. Mild mitral valve regurgitation.  4. The aortic valve is abnormal. Aortic valve regurgitation is not visualized. Mild aortic valve sclerosis is present, with no evidence of aortic valve stenosis.  5. The inferior vena cava is normal in size with greater than 50% respiratory variability, suggesting right atrial pressure of 3 mmHg. FINDINGS  Left Ventricle: Left ventricular ejection fraction, by estimation, is 60 to 65%. The left  ventricle has normal function. The left ventricle has no regional wall motion abnormalities. The left ventricular internal cavity size was normal in size. There is  no left ventricular hypertrophy. Left ventricular diastolic parameters are indeterminate. Right Ventricle: The right ventricular size is mildly enlarged. No increase in right ventricular wall thickness. Right ventricular systolic function is normal. Left Atrium: Left atrial size was normal in size. Right Atrium: Right atrial size was normal in size. Pericardium: There is no evidence of pericardial effusion. Mitral Valve: The mitral valve is normal in structure. Mild mitral valve regurgitation. Tricuspid Valve: The tricuspid valve is grossly normal. Tricuspid valve regurgitation is trivial. Aortic Valve: The aortic valve is abnormal. Aortic valve regurgitation is not visualized. Mild aortic valve sclerosis is present, with no evidence of aortic valve stenosis. Pulmonic Valve: The pulmonic valve was not well visualized. Pulmonic valve regurgitation is not visualized. Aorta: The aortic root and ascending aorta are structurally normal, with no evidence of dilitation and the aortic root is normal in size and structure. Venous: The inferior vena cava is normal in size with greater than 50% respiratory variability, suggesting right atrial pressure of 3 mmHg. IAS/Shunts: The interatrial septum was not assessed.  LEFT VENTRICLE PLAX 2D LVIDd:         3.83 cm  Diastology LVIDs:         2.52 cm  LV e' medial:    6.74 cm/s LV PW:         0.81 cm  LV E/e' medial:  9.8 LV IVS:        0.88 cm  LV e' lateral:   7.72 cm/s LVOT diam:     1.90 cm  LV E/e' lateral: 8.5 LV SV:  55 LV SV Index:   31 LVOT Area:     2.84 cm  RIGHT VENTRICLE TAPSE (M-mode): 2.5 cm LEFT ATRIUM             Index       RIGHT ATRIUM           Index LA diam:        3.00 cm 1.70 cm/m  RA Area:     12.80 cm LA Vol (A2C):   53.9 ml 30.62 ml/m RA Volume:   29.10 ml  16.53 ml/m LA Vol (A4C):    24.5 ml 13.92 ml/m LA Biplane Vol: 36.8 ml 20.91 ml/m  AORTIC VALVE LVOT Vmax:   80.30 cm/s LVOT Vmean:  61.000 cm/s LVOT VTI:    0.194 m  AORTA Ao Root diam: 2.80 cm MITRAL VALVE MV Area (PHT): 2.83 cm    SHUNTS MV Decel Time: 268 msec    Systemic VTI:  0.19 m MV E velocity: 66.00 cm/s  Systemic Diam: 1.90 cm MV A velocity: 80.60 cm/s MV E/A ratio:  0.82 Dietrich Pates MD Electronically signed by Dietrich Pates MD Signature Date/Time: 07/21/2020/3:17:28 PM    Final    CT HEAD CODE STROKE WO CONTRAST  Result Date: 07/20/2020 CLINICAL DATA:  Code stroke.  Left-sided weakness and slurred speech EXAM: CT HEAD WITHOUT CONTRAST CT ANGIOGRAPHY OF THE HEAD AND NECK TECHNIQUE: Contiguous axial images were obtained from the base of the skull through the vertex without intravenous contrast. Multidetector CT imaging of the head and neck was performed using the standard protocol during bolus administration of intravenous contrast. Multiplanar CT image reconstructions and MIPs were obtained to evaluate the vascular anatomy. Carotid stenosis measurements (when applicable) are obtained utilizing NASCET criteria, using the distal internal carotid diameter as the denominator. CONTRAST:  50mL OMNIPAQUE IOHEXOL 350 MG/ML SOLN COMPARISON:  None. FINDINGS: CT HEAD FINDINGS Brain: There is no mass, hemorrhage or extra-axial collection. The size and configuration of the ventricles and extra-axial CSF spaces are normal. There is hypoattenuation of the periventricular white matter, most commonly indicating chronic ischemic microangiopathy. Vascular: No abnormal hyperdensity of the major intracranial arteries or dural venous sinuses. No intracranial atherosclerosis. Skull: The visualized skull base, calvarium and extracranial soft tissues are normal. Sinuses/Orbits: No fluid levels or advanced mucosal thickening of the visualized paranasal sinuses. No mastoid or middle ear effusion. The orbits are normal. ASPECTS (Alberta Stroke Program Early  CT Score) - Ganglionic level infarction (caudate, lentiform nuclei, internal capsule, insula, M1-M3 cortex): 7 - Supraganglionic infarction (M4-M6 cortex): 3 Total score (0-10 with 10 being normal): 10 CTA NECK FINDINGS SKELETON: There is no bony spinal canal stenosis. No lytic or blastic lesion. OTHER NECK: Normal pharynx, larynx and major salivary glands. No cervical lymphadenopathy. Unremarkable thyroid gland. UPPER CHEST: No pneumothorax or pleural effusion. No nodules or masses. AORTIC ARCH: There is calcific atherosclerosis of the aortic arch. There is no aneurysm, dissection or hemodynamically significant stenosis of the visualized portion of the aorta. Conventional 3 vessel aortic branching pattern. The visualized proximal subclavian arteries are widely patent. RIGHT CAROTID SYSTEM: No dissection, occlusion or aneurysm. Mild atherosclerotic calcification at the carotid bifurcation without hemodynamically significant stenosis. LEFT CAROTID SYSTEM: No dissection, occlusion or aneurysm. Mild atherosclerotic calcification at the carotid bifurcation without hemodynamically significant stenosis. VERTEBRAL ARTERIES: Left dominant configuration. Both origins are clearly patent. There is no dissection, occlusion or flow-limiting stenosis to the skull base (V1-V3 segments). CTA HEAD FINDINGS POSTERIOR CIRCULATION: --Vertebral arteries: Mild atherosclerotic calcification of  the left V4 segment. Otherwise normal. --Inferior cerebellar arteries: Normal. --Basilar artery: Normal. --Superior cerebellar arteries: Normal. --Posterior cerebral arteries (PCA): Normal. ANTERIOR CIRCULATION: --Intracranial internal carotid arteries: Normal. --Anterior cerebral arteries (ACA): Normal. Both A1 segments are present. Patent anterior communicating artery (a-comm). --Middle cerebral arteries (MCA): Normal. VENOUS SINUSES: As permitted by contrast timing, patent. ANATOMIC VARIANTS: Hypoplastic right ACA A1 segment, a common variant.  Review of the MIP images confirms the above findings. IMPRESSION: 1. No emergent large vessel occlusion or high-grade stenosis of the head or neck. Aortic Atherosclerosis (ICD10-I70.0). Electronically Signed   By: Deatra Robinson M.D.   On: 07/20/2020 20:34   CT ANGIO HEAD CODE STROKE  Result Date: 07/20/2020 CLINICAL DATA:  Code stroke.  Left-sided weakness and slurred speech EXAM: CT HEAD WITHOUT CONTRAST CT ANGIOGRAPHY OF THE HEAD AND NECK TECHNIQUE: Contiguous axial images were obtained from the base of the skull through the vertex without intravenous contrast. Multidetector CT imaging of the head and neck was performed using the standard protocol during bolus administration of intravenous contrast. Multiplanar CT image reconstructions and MIPs were obtained to evaluate the vascular anatomy. Carotid stenosis measurements (when applicable) are obtained utilizing NASCET criteria, using the distal internal carotid diameter as the denominator. CONTRAST:  50mL OMNIPAQUE IOHEXOL 350 MG/ML SOLN COMPARISON:  None. FINDINGS: CT HEAD FINDINGS Brain: There is no mass, hemorrhage or extra-axial collection. The size and configuration of the ventricles and extra-axial CSF spaces are normal. There is hypoattenuation of the periventricular white matter, most commonly indicating chronic ischemic microangiopathy. Vascular: No abnormal hyperdensity of the major intracranial arteries or dural venous sinuses. No intracranial atherosclerosis. Skull: The visualized skull base, calvarium and extracranial soft tissues are normal. Sinuses/Orbits: No fluid levels or advanced mucosal thickening of the visualized paranasal sinuses. No mastoid or middle ear effusion. The orbits are normal. ASPECTS (Al[place]ta Stroke Program Early CT Score) - Ganglionic level infarction (caudate, lentiform nuclei, internal capsule, insula, M1-M3 cortex): 7 - Supraganglionic infarction (M4-M6 cortex): 3 Total score (0-10 with 10 being normal): 10 CTA NECK  FINDINGS SKELETON: There is no bony spinal canal stenosis. No lytic or blastic lesion. OTHER NECK: Normal pharynx, larynx and major salivary glands. No cervical lymphadenopathy. Unremarkable thyroid gland. UPPER CHEST: No pneumothorax or pleural effusion. No nodules or masses. AORTIC ARCH: There is calcific atherosclerosis of the aortic arch. There is no aneurysm, dissection or hemodynamically significant stenosis of the visualized portion of the aorta. Conventional 3 vessel aortic branching pattern. The visualized proximal subclavian arteries are widely patent. RIGHT CAROTID SYSTEM: No dissection, occlusion or aneurysm. Mild atherosclerotic calcification at the carotid bifurcation without hemodynamically significant stenosis. LEFT CAROTID SYSTEM: No dissection, occlusion or aneurysm. Mild atherosclerotic calcification at the carotid bifurcation without hemodynamically significant stenosis. VERTEBRAL ARTERIES: Left dominant configuration. Both origins are clearly patent. There is no dissection, occlusion or flow-limiting stenosis to the skull base (V1-V3 segments). CTA HEAD FINDINGS POSTERIOR CIRCULATION: --Vertebral arteries: Mild atherosclerotic calcification of the left V4 segment. Otherwise normal. --Inferior cerebellar arteries: Normal. --Basilar artery: Normal. --Superior cerebellar arteries: Normal. --Posterior cerebral arteries (PCA): Normal. ANTERIOR CIRCULATION: --Intracranial internal carotid arteries: Normal. --Anterior cerebral arteries (ACA): Normal. Both A1 segments are present. Patent anterior communicating artery (a-comm). --Middle cerebral arteries (MCA): Normal. VENOUS SINUSES: As permitted by contrast timing, patent. ANATOMIC VARIANTS: Hypoplastic right ACA A1 segment, a common variant. Review of the MIP images confirms the above findings. IMPRESSION: 1. No emergent large vessel occlusion or high-grade stenosis of the head or neck. Aortic Atherosclerosis (ICD10-I70.0). Electronically  Signed   By:  Deatra Robinson M.D.   On: 07/20/2020 20:34   CT ANGIO NECK CODE STROKE  Result Date: 07/20/2020 CLINICAL DATA:  Code stroke.  Left-sided weakness and slurred speech EXAM: CT HEAD WITHOUT CONTRAST CT ANGIOGRAPHY OF THE HEAD AND NECK TECHNIQUE: Contiguous axial images were obtained from the base of the skull through the vertex without intravenous contrast. Multidetector CT imaging of the head and neck was performed using the standard protocol during bolus administration of intravenous contrast. Multiplanar CT image reconstructions and MIPs were obtained to evaluate the vascular anatomy. Carotid stenosis measurements (when applicable) are obtained utilizing NASCET criteria, using the distal internal carotid diameter as the denominator. CONTRAST:  76mL OMNIPAQUE IOHEXOL 350 MG/ML SOLN COMPARISON:  None. FINDINGS: CT HEAD FINDINGS Brain: There is no mass, hemorrhage or extra-axial collection. The size and configuration of the ventricles and extra-axial CSF spaces are normal. There is hypoattenuation of the periventricular white matter, most commonly indicating chronic ischemic microangiopathy. Vascular: No abnormal hyperdensity of the major intracranial arteries or dural venous sinuses. No intracranial atherosclerosis. Skull: The visualized skull base, calvarium and extracranial soft tissues are normal. Sinuses/Orbits: No fluid levels or advanced mucosal thickening of the visualized paranasal sinuses. No mastoid or middle ear effusion. The orbits are normal. ASPECTS ([place] Stroke Program Early CT Score) - Ganglionic level infarction (caudate, lentiform nuclei, internal capsule, insula, M1-M3 cortex): 7 - Supraganglionic infarction (M4-M6 cortex): 3 Total score (0-10 with 10 being normal): 10 CTA NECK FINDINGS SKELETON: There is no bony spinal canal stenosis. No lytic or blastic lesion. OTHER NECK: Normal pharynx, larynx and major salivary glands. No cervical lymphadenopathy. Unremarkable thyroid gland. UPPER CHEST:  No pneumothorax or pleural effusion. No nodules or masses. AORTIC ARCH: There is calcific atherosclerosis of the aortic arch. There is no aneurysm, dissection or hemodynamically significant stenosis of the visualized portion of the aorta. Conventional 3 vessel aortic branching pattern. The visualized proximal subclavian arteries are widely patent. RIGHT CAROTID SYSTEM: No dissection, occlusion or aneurysm. Mild atherosclerotic calcification at the carotid bifurcation without hemodynamically significant stenosis. LEFT CAROTID SYSTEM: No dissection, occlusion or aneurysm. Mild atherosclerotic calcification at the carotid bifurcation without hemodynamically significant stenosis. VERTEBRAL ARTERIES: Left dominant configuration. Both origins are clearly patent. There is no dissection, occlusion or flow-limiting stenosis to the skull base (V1-V3 segments). CTA HEAD FINDINGS POSTERIOR CIRCULATION: --Vertebral arteries: Mild atherosclerotic calcification of the left V4 segment. Otherwise normal. --Inferior cerebellar arteries: Normal. --Basilar artery: Normal. --Superior cerebellar arteries: Normal. --Posterior cerebral arteries (PCA): Normal. ANTERIOR CIRCULATION: --Intracranial internal carotid arteries: Normal. --Anterior cerebral arteries (ACA): Normal. Both A1 segments are present. Patent anterior communicating artery (a-comm). --Middle cerebral arteries (MCA): Normal. VENOUS SINUSES: As permitted by contrast timing, patent. ANATOMIC VARIANTS: Hypoplastic right ACA A1 segment, a common variant. Review of the MIP images confirms the above findings. IMPRESSION: 1. No emergent large vessel occlusion or high-grade stenosis of the head or neck. Aortic Atherosclerosis (ICD10-I70.0). Electronically Signed   By: Deatra Robinson M.D.   On: 07/20/2020 20:34     Time Spent in minutes  30     Laverna Peace M.D on 07/25/2020 at 10:09 AM  To page go to [email or web address] - password [facility]

## 2020-07-25 NOTE — Progress Notes (Signed)
Physical Therapy Treatment Patient Details Name: Nicole [name] MRN: [id number] DOB: [birth year] Today's Date: 07/25/2020    History of Present Illness 86 y.o. female presenting with left sided weakness. MRI positive for acute small vessel infarct in the posterior right corona radiata and lentiform. No associated hemorrhage or mass effect. PMH significant for HTN, hypothyroidism, OA, HLD, glaucoma.    PT Comments    Progressing slowly towards physical therapy goals. Was able to perform transfers with +2 assist for balance support and safety. Stedy utilized for standing trials, and to work on LUE positioning and grip onto the center bar of stedy. Pt will benefit from CIR stay to maximize functional independence, safety, and decrease burden of care on family as pt is hopeful for return home.     Follow Up Recommendations  CIR;Supervision/Assistance - 24 hour     Equipment Recommendations  Other (comment) (TBA)    Recommendations for Other Services OT consult;Rehab consult     Precautions / Restrictions Precautions Precautions: Fall Precaution Comments: Left side weakness. Inattention to L Restrictions Weight Bearing Restrictions: No    Mobility  Bed Mobility Overal bed mobility: Needs Assistance Bed Mobility: Supine to Sit     Supine to sit: Mod assist;HOB elevated;+2 for safety/equipment     General bed mobility comments: Mod A to elevate turnk and manage LLE. +2 for safety and posterior support on trunk to maintain sitting balance.   Transfers Overall transfer level: Needs assistance   Transfers: Sit to/from Stand Sit to Stand: Mod assist;+2 physical assistance;+2 safety/equipment         General transfer comment: +2 assist to power-up to full stand, maintain UE support on Stedy and practice standing trials. Pt fatiguing quickly but was able to attempt x3 standing trials this session.   Ambulation/Gait             General Gait Details: Did not attempt -  pre-gait only of weight shifting in [place] before pt fatigued.    Stairs             Wheelchair Mobility    Modified Rankin (Stroke Patients Only)       Balance Overall balance assessment: Needs assistance Sitting-balance support: No upper extremity supported;Feet supported;Single extremity supported Sitting balance-Leahy Scale: Poor Sitting balance - Comments: Requires assist Postural control: Posterior lean;Left lateral lean Standing balance support: During functional activity;Bilateral upper extremity supported Standing balance-Leahy Scale: Zero Standing balance comment: Required max assist +1 for standing balance                            Cognition Arousal/Alertness: Awake/alert Behavior During Therapy: WFL for tasks assessed/performed Overall Cognitive Status: Impaired/Different from baseline Area of Impairment: Problem solving;Awareness;Following commands                       Following Commands: Follows one step commands with increased time;Follows multi-step commands inconsistently;Follows multi-step commands with increased time   Awareness: Emergent Problem Solving: Slow processing;Requires verbal cues;Requires tactile cues General Comments: Pt requiring increased time for processing. Pt very motivated to particiapte in therapy session. Pt also present with decreased awareness of L side.       Exercises      General Comments        Pertinent Vitals/Pain Pain Assessment: No/denies pain    Home Living  Prior Function            PT Goals (current goals can now be found in the care plan section) Acute Rehab PT Goals Patient Stated Goal: get back independence PT Goal Formulation: With patient Time For Goal Achievement: 08/04/20 Potential to Achieve Goals: Good Progress towards PT goals: Progressing toward goals    Frequency    Min 4X/week      PT Plan Current plan remains appropriate     Co-evaluation              AM-PAC PT "6 Clicks" Mobility   Outcome Measure  Help needed turning from your back to your side while in a flat bed without using bedrails?: None Help needed moving from lying on your back to sitting on the side of a flat bed without using bedrails?: A Little Help needed moving to and from a bed to a chair (including a wheelchair)?: A Lot Help needed standing up from a chair using your arms (e.g., wheelchair or bedside chair)?: A Lot Help needed to walk in hospital room?: Total Help needed climbing 3-5 steps with a railing? : Total 6 Click Score: 13    End of Session Equipment Utilized During Treatment: Gait belt Activity Tolerance: Patient tolerated treatment well Patient left: in chair;with call bell/phone within reach;with chair alarm set Nurse Communication: Mobility status PT Visit Diagnosis: Unsteadiness on feet (R26.81);Other abnormalities of gait and mobility (R26.89);Muscle weakness (generalized) (M62.81);Difficulty in walking, not elsewhere classified (R26.2);Hemiplegia and hemiparesis Hemiplegia - Right/Left: Left Hemiplegia - dominant/non-dominant: Non-dominant Hemiplegia - caused by: Cerebral infarction     Time: 6203-5597 PT Time Calculation (min) (ACUTE ONLY): 27 min  Charges:  $Gait Training: 23-37 mins                     Nicole [name], PT, DPT Acute Rehabilitation Services Pager: [phone] Office: [phone]    Nicole [name] 07/25/2020, 3:42 PM

## 2020-07-25 NOTE — Plan of Care (Signed)
  Problem: Education: Goal: Knowledge of disease or condition will improve Outcome: Progressing Goal: Knowledge of secondary prevention will improve Outcome: Progressing Goal: Knowledge of patient specific risk factors addressed and post discharge goals established will improve Outcome: Progressing Goal: Individualized Educational Video(s) Outcome: Progressing   Problem: Coping: Goal: Will verbalize positive feelings about self Outcome: Progressing Goal: Will identify appropriate support needs Outcome: Progressing   Problem: Health Behavior/Discharge Planning: Goal: Ability to manage health-related needs will improve Outcome: Progressing   Problem: Self-Care: Goal: Ability to participate in self-care as condition permits will improve Outcome: Progressing Goal: Verbalization of feelings and concerns over difficulty with self-care will improve Outcome: Progressing Goal: Ability to communicate needs accurately will improve Outcome: Progressing   Problem: Ischemic Stroke/TIA Tissue Perfusion: Goal: Complications of ischemic stroke/TIA will be minimized Outcome: Progressing   Problem: Education: Goal: Knowledge of General Education information will improve Description: Including pain rating scale, medication(s)/side effects and non-pharmacologic comfort measures Outcome: Progressing   Problem: Health Behavior/Discharge Planning: Goal: Ability to manage health-related needs will improve Outcome: Progressing   Problem: Clinical Measurements: Goal: Ability to maintain clinical measurements within normal limits will improve Outcome: Progressing Goal: Will remain free from infection Outcome: Progressing Goal: Diagnostic test results will improve Outcome: Progressing Goal: Respiratory complications will improve Outcome: Progressing Goal: Cardiovascular complication will be avoided Outcome: Progressing   Problem: Activity: Goal: Risk for activity intolerance will  decrease Outcome: Progressing   Problem: Nutrition: Goal: Adequate nutrition will be maintained Outcome: Progressing   Problem: Coping: Goal: Level of anxiety will decrease Outcome: Progressing   Problem: Elimination: Goal: Will not experience complications related to bowel motility Outcome: Progressing Goal: Will not experience complications related to urinary retention Outcome: Progressing   Problem: Pain Managment: Goal: General experience of comfort will improve Outcome: Progressing   Problem: Safety: Goal: Ability to remain free from injury will improve Outcome: Progressing   Problem: Skin Integrity: Goal: Risk for impaired skin integrity will decrease Outcome: Progressing   

## 2020-07-26 ENCOUNTER — Other Ambulatory Visit: Payer: Self-pay

## 2020-07-26 ENCOUNTER — Inpatient Hospital Stay (HOSPITAL_COMMUNITY)
Admission: RE | Admit: 2020-07-26 | Discharge: 2020-08-17 | DRG: 057 | Disposition: A | Discharge status: Home Health | Payer: Medicare Other | Source: Intra-hospital | Attending: Physical Medicine and Rehabilitation | Admitting: Physical Medicine and Rehabilitation

## 2020-07-26 ENCOUNTER — Encounter (HOSPITAL_COMMUNITY): Payer: Self-pay | Admitting: Physical Medicine and Rehabilitation

## 2020-07-26 DIAGNOSIS — J45909 Unspecified asthma, uncomplicated: Secondary | ICD-10-CM | POA: Diagnosis present

## 2020-07-26 DIAGNOSIS — Z79890 Hormone replacement therapy (postmenopausal): Secondary | ICD-10-CM | POA: Diagnosis not present

## 2020-07-26 DIAGNOSIS — M21372 Foot drop, left foot: Secondary | ICD-10-CM | POA: Diagnosis not present

## 2020-07-26 DIAGNOSIS — I69354 Hemiplegia and hemiparesis following cerebral infarction affecting left non-dominant side: Principal | ICD-10-CM

## 2020-07-26 DIAGNOSIS — I6381 Other cerebral infarction due to occlusion or stenosis of small artery: Secondary | ICD-10-CM | POA: Diagnosis present

## 2020-07-26 DIAGNOSIS — I69392 Facial weakness following cerebral infarction: Secondary | ICD-10-CM | POA: Diagnosis not present

## 2020-07-26 DIAGNOSIS — Z833 Family history of diabetes mellitus: Secondary | ICD-10-CM

## 2020-07-26 DIAGNOSIS — K5900 Constipation, unspecified: Secondary | ICD-10-CM | POA: Diagnosis present

## 2020-07-26 DIAGNOSIS — K5901 Slow transit constipation: Secondary | ICD-10-CM | POA: Diagnosis not present

## 2020-07-26 DIAGNOSIS — Z23 Encounter for immunization: Secondary | ICD-10-CM

## 2020-07-26 DIAGNOSIS — Z79899 Other long term (current) drug therapy: Secondary | ICD-10-CM | POA: Diagnosis not present

## 2020-07-26 DIAGNOSIS — E876 Hypokalemia: Secondary | ICD-10-CM | POA: Diagnosis not present

## 2020-07-26 DIAGNOSIS — I10 Essential (primary) hypertension: Secondary | ICD-10-CM | POA: Diagnosis not present

## 2020-07-26 DIAGNOSIS — E039 Hypothyroidism, unspecified: Secondary | ICD-10-CM | POA: Diagnosis present

## 2020-07-26 DIAGNOSIS — N3281 Overactive bladder: Secondary | ICD-10-CM | POA: Diagnosis present

## 2020-07-26 DIAGNOSIS — E785 Hyperlipidemia, unspecified: Secondary | ICD-10-CM | POA: Diagnosis not present

## 2020-07-26 DIAGNOSIS — I639 Cerebral infarction, unspecified: Secondary | ICD-10-CM | POA: Diagnosis not present

## 2020-07-26 DIAGNOSIS — G8929 Other chronic pain: Secondary | ICD-10-CM

## 2020-07-26 DIAGNOSIS — M25562 Pain in left knee: Secondary | ICD-10-CM

## 2020-07-26 MED ORDER — SENNOSIDES-DOCUSATE SODIUM 8.6-50 MG PO TABS: 2.0000 | ORAL_TABLET | Freq: Two times a day (BID) | ORAL | Status: DC

## 2020-07-26 MED ORDER — LEVOTHYROXINE SODIUM 50 MCG PO TABS
50.0000 ug | ORAL_TABLET | Freq: Every day | ORAL | Status: DC
  Administered 2020-07-27 – 2020-08-17 (×22): 50 ug via ORAL
  Filled 2020-07-26 (×22): qty 1

## 2020-07-26 MED ORDER — OXYBUTYNIN CHLORIDE ER 5 MG PO TB24
5.0000 mg | ORAL_TABLET | Freq: Every day | ORAL | Status: DC
  Administered 2020-07-26 – 2020-08-16 (×22): 5 mg via ORAL
  Filled 2020-07-26 (×24): qty 1

## 2020-07-26 MED ORDER — ATORVASTATIN CALCIUM 80 MG PO TABS
80.0000 mg | ORAL_TABLET | Freq: Every day | ORAL | Status: DC
  Administered 2020-07-27 – 2020-08-17 (×22): 80 mg via ORAL
  Filled 2020-07-26 (×22): qty 1

## 2020-07-26 MED ORDER — STUDY - AXIOMATIC STUDY - ASPIRIN 100 MG (PI-SETHI)
100.0000 mg | ORAL_TABLET | Freq: Every day | ORAL | Status: DC
  Administered 2020-07-27 – 2020-08-17 (×22): 100 mg via ORAL
  Filled 2020-07-26 (×26): qty 100

## 2020-07-26 MED ORDER — SORBITOL 70 % SOLN: 30.0000 mL | Freq: Every day | Status: DC | PRN

## 2020-07-26 MED ORDER — STUDY - AXIOMATIC STUDY - BMS986177 OR PLACEBO (PI-SETHI)
2.0000 | ORAL_CAPSULE | Freq: Two times a day (BID) | ORAL | Status: DC
  Administered 2020-07-27 – 2020-08-17 (×43): 2 via ORAL
  Filled 2020-07-26 (×47): qty 2

## 2020-07-26 MED ORDER — DICLOFENAC SODIUM 1 % EX GEL: 2.0000 g | Freq: Four times a day (QID) | CUTANEOUS | Status: DC | PRN

## 2020-07-26 MED ORDER — TRAMADOL HCL 50 MG PO TABS
50.0000 mg | ORAL_TABLET | Freq: Four times a day (QID) | ORAL | Status: DC | PRN
  Administered 2020-07-26 – 2020-08-02 (×5): 50 mg via ORAL
  Filled 2020-07-26 (×5): qty 1

## 2020-07-26 MED ORDER — TRAMADOL HCL 50 MG PO TABS
50.0000 mg | ORAL_TABLET | Freq: Four times a day (QID) | ORAL | Status: DC | PRN
  Administered 2020-07-26: 50 mg via ORAL
  Filled 2020-07-26: qty 1

## 2020-07-26 MED ORDER — TRAMADOL HCL 50 MG PO TABS
50.0000 mg | ORAL_TABLET | Freq: Four times a day (QID) | ORAL | Status: DC | PRN
Start: 2020-07-26 — End: 2020-08-17

## 2020-07-26 MED ORDER — ALBUTEROL SULFATE HFA 108 (90 BASE) MCG/ACT IN AERS
1.0000 | INHALATION_SPRAY | RESPIRATORY_TRACT | Status: DC | PRN
  Filled 2020-07-26: qty 6.7

## 2020-07-26 MED ORDER — ACETAMINOPHEN 650 MG RE SUPP: 650.0000 mg | RECTAL | Status: DC | PRN

## 2020-07-26 MED ORDER — STUDY - INVESTIGATIONAL MEDICATION: 99 refills | Status: DC

## 2020-07-26 MED ORDER — POLYETHYLENE GLYCOL 3350 17 G PO PACK
17.0000 g | PACK | Freq: Two times a day (BID) | ORAL | Status: DC
  Administered 2020-07-26 – 2020-07-27 (×2): 17 g via ORAL
  Filled 2020-07-26 (×2): qty 1

## 2020-07-26 MED ORDER — CETIRIZINE HCL 10 MG PO TABS: 10.0000 mg | ORAL_TABLET | Freq: Every day | ORAL | Status: AC | PRN

## 2020-07-26 MED ORDER — POLYETHYLENE GLYCOL 3350 17 G PO PACK: 17.0000 g | PACK | Freq: Every day | ORAL | 0 refills | Status: AC | PRN

## 2020-07-26 MED ORDER — ACETAMINOPHEN 160 MG/5ML PO SOLN: 650.0000 mg | ORAL | Status: DC | PRN

## 2020-07-26 MED ORDER — ATORVASTATIN CALCIUM 80 MG PO TABS
80.0000 mg | ORAL_TABLET | Freq: Every day | ORAL | 1 refills | Status: DC
Start: 2020-07-27 — End: 2020-08-16

## 2020-07-26 MED ORDER — SENNOSIDES-DOCUSATE SODIUM 8.6-50 MG PO TABS
2.0000 | ORAL_TABLET | Freq: Two times a day (BID) | ORAL | Status: DC
  Administered 2020-07-26 – 2020-08-04 (×10): 2 via ORAL
  Filled 2020-07-26 (×19): qty 2

## 2020-07-26 MED ORDER — STUDY - AXIOMATIC STUDY - CLOPIDOGREL 75MG (PI-SETHI)
75.0000 mg | ORAL_TABLET | ORAL | Status: DC
  Administered 2020-07-27 – 2020-08-10 (×15): 75 mg via ORAL
  Filled 2020-07-26 (×19): qty 75

## 2020-07-26 MED ORDER — ACETAMINOPHEN 325 MG PO TABS
650.0000 mg | ORAL_TABLET | ORAL | Status: DC | PRN
  Administered 2020-07-31 – 2020-08-02 (×3): 650 mg via ORAL
  Filled 2020-07-26 (×3): qty 2

## 2020-07-26 NOTE — Progress Notes (Signed)
Inpatient Rehabilitation Admissions Coordinator  I await insurance determination for a possible Cir admit.  Ottie Glazier, RN, MSN Rehab Admissions Coordinator [phone] 07/26/2020 12:47 PM

## 2020-07-26 NOTE — Plan of Care (Signed)
Problem: Education: Goal: Knowledge of disease or condition will improve 07/26/2020 2010 by Myriam Forehand, RN Outcome: Completed/Met 07/26/2020 2009 by Myriam Forehand, RN Outcome: Adequate for Discharge 07/26/2020 2008 by Myriam Forehand, RN Outcome: Adequate for Discharge Goal: Knowledge of secondary prevention will improve 07/26/2020 2010 by Myriam Forehand, RN Outcome: Completed/Met 07/26/2020 2009 by Myriam Forehand, RN Outcome: Adequate for Discharge 07/26/2020 2008 by Myriam Forehand, RN Outcome: Adequate for Discharge Goal: Knowledge of patient specific risk factors addressed and post discharge goals established will improve 07/26/2020 2010 by Myriam Forehand, RN Outcome: Completed/Met 07/26/2020 2009 by Myriam Forehand, RN Outcome: Adequate for Discharge 07/26/2020 2008 by Myriam Forehand, RN Outcome: Adequate for Discharge Goal: Individualized Educational Video(s) 07/26/2020 2010 by Myriam Forehand, RN Outcome: Completed/Met 07/26/2020 2009 by Myriam Forehand, RN Outcome: Adequate for Discharge 07/26/2020 2008 by Myriam Forehand, RN Outcome: Adequate for Discharge   Problem: Coping: Goal: Will verbalize positive feelings about self 07/26/2020 2010 by Myriam Forehand, RN Outcome: Completed/Met 07/26/2020 2009 by Myriam Forehand, RN Outcome: Adequate for Discharge 07/26/2020 2008 by Myriam Forehand, RN Outcome: Adequate for Discharge Goal: Will identify appropriate support needs 07/26/2020 2010 by Myriam Forehand, RN Outcome: Completed/Met 07/26/2020 2009 by Myriam Forehand, RN Outcome: Adequate for Discharge 07/26/2020 2008 by Myriam Forehand, RN Outcome: Adequate for Discharge   Problem: Health Behavior/Discharge Planning: Goal: Ability to manage health-related needs will improve 07/26/2020 2010 by Myriam Forehand, RN Outcome: Completed/Met 07/26/2020 2009 by Myriam Forehand, RN Outcome: Adequate for Discharge 07/26/2020 2008 by Myriam Forehand, RN Outcome: Adequate for Discharge   Problem: Self-Care: Goal:  Ability to participate in self-care as condition permits will improve 07/26/2020 2010 by Myriam Forehand, RN Outcome: Completed/Met 07/26/2020 2009 by Myriam Forehand, RN Outcome: Adequate for Discharge 07/26/2020 2008 by Myriam Forehand, RN Outcome: Adequate for Discharge Goal: Verbalization of feelings and concerns over difficulty with self-care will improve 07/26/2020 2010 by Myriam Forehand, RN Outcome: Completed/Met 07/26/2020 2009 by Myriam Forehand, RN Outcome: Adequate for Discharge 07/26/2020 2008 by Myriam Forehand, RN Outcome: Adequate for Discharge Goal: Ability to communicate needs accurately will improve 07/26/2020 2010 by Myriam Forehand, RN Outcome: Completed/Met 07/26/2020 2009 by Myriam Forehand, RN Outcome: Adequate for Discharge 07/26/2020 2008 by Myriam Forehand, RN Outcome: Adequate for Discharge   Problem: Ischemic Stroke/TIA Tissue Perfusion: Goal: Complications of ischemic stroke/TIA will be minimized 07/26/2020 2010 by Myriam Forehand, RN Outcome: Completed/Met 07/26/2020 2009 by Myriam Forehand, RN Outcome: Adequate for Discharge 07/26/2020 2008 by Myriam Forehand, RN Outcome: Adequate for Discharge   Problem: Education: Goal: Knowledge of General Education information will improve Description: Including pain rating scale, medication(s)/side effects and non-pharmacologic comfort measures 07/26/2020 2010 by Myriam Forehand, RN Outcome: Completed/Met 07/26/2020 2009 by Myriam Forehand, RN Outcome: Adequate for Discharge 07/26/2020 2008 by Myriam Forehand, RN Outcome: Adequate for Discharge   Problem: Health Behavior/Discharge Planning: Goal: Ability to manage health-related needs will improve 07/26/2020 2010 by Myriam Forehand, RN Outcome: Completed/Met 07/26/2020 2009 by Myriam Forehand, RN Outcome: Adequate for Discharge 07/26/2020 2008 by Myriam Forehand, RN Outcome: Adequate for Discharge   Problem: Clinical Measurements: Goal: Ability to maintain clinical measurements within normal limits  will improve 07/26/2020 2010 by Myriam Forehand, RN Outcome: Completed/Met 07/26/2020 2009 by Myriam Forehand, RN Outcome: Adequate for Discharge 07/26/2020 2008 by Myriam Forehand, RN Outcome: Adequate for Discharge Goal: Will remain free from infection 07/26/2020 2010 by Myriam Forehand, RN Outcome: Completed/Met 07/26/2020 2009 by Myriam Forehand, RN Outcome: Adequate for Discharge 07/26/2020 2008 by Myriam Forehand, RN Outcome: Adequate for Discharge Goal: Diagnostic  test results will improve 07/26/2020 2010 by Myriam Forehand, RN Outcome: Completed/Met 07/26/2020 2009 by Myriam Forehand, RN Outcome: Adequate for Discharge 07/26/2020 2008 by Myriam Forehand, RN Outcome: Adequate for Discharge Goal: Respiratory complications will improve 07/26/2020 2010 by Myriam Forehand, RN Outcome: Completed/Met 07/26/2020 2009 by Myriam Forehand, RN Outcome: Adequate for Discharge 07/26/2020 2008 by Myriam Forehand, RN Outcome: Adequate for Discharge Goal: Cardiovascular complication will be avoided 07/26/2020 2010 by Myriam Forehand, RN Outcome: Completed/Met 07/26/2020 2009 by Myriam Forehand, RN Outcome: Adequate for Discharge 07/26/2020 2008 by Myriam Forehand, RN Outcome: Adequate for Discharge   Problem: Activity: Goal: Risk for activity intolerance will decrease 07/26/2020 2010 by Myriam Forehand, RN Outcome: Completed/Met 07/26/2020 2009 by Myriam Forehand, RN Outcome: Adequate for Discharge 07/26/2020 2008 by Myriam Forehand, RN Outcome: Adequate for Discharge   Problem: Nutrition: Goal: Adequate nutrition will be maintained 07/26/2020 2010 by Myriam Forehand, RN Outcome: Completed/Met 07/26/2020 2009 by Myriam Forehand, RN Outcome: Adequate for Discharge 07/26/2020 2008 by Myriam Forehand, RN Outcome: Adequate for Discharge   Problem: Coping: Goal: Level of anxiety will decrease 07/26/2020 2010 by Myriam Forehand, RN Outcome: Completed/Met 07/26/2020 2009 by Myriam Forehand, RN Outcome: Adequate for Discharge 07/26/2020 2008  by Myriam Forehand, RN Outcome: Adequate for Discharge   Problem: Elimination: Goal: Will not experience complications related to bowel motility 07/26/2020 2010 by Myriam Forehand, RN Outcome: Completed/Met 07/26/2020 2009 by Myriam Forehand, RN Outcome: Adequate for Discharge 07/26/2020 2008 by Myriam Forehand, RN Outcome: Adequate for Discharge Goal: Will not experience complications related to urinary retention 07/26/2020 2010 by Myriam Forehand, RN Outcome: Completed/Met 07/26/2020 2009 by Myriam Forehand, RN Outcome: Adequate for Discharge 07/26/2020 2008 by Myriam Forehand, RN Outcome: Adequate for Discharge   Problem: Pain Managment: Goal: General experience of comfort will improve 07/26/2020 2010 by Myriam Forehand, RN Outcome: Completed/Met 07/26/2020 2009 by Myriam Forehand, RN Outcome: Adequate for Discharge 07/26/2020 2008 by Myriam Forehand, RN Outcome: Adequate for Discharge   Problem: Safety: Goal: Ability to remain free from injury will improve 07/26/2020 2010 by Myriam Forehand, RN Outcome: Completed/Met 07/26/2020 2009 by Myriam Forehand, RN Outcome: Adequate for Discharge 07/26/2020 2008 by Myriam Forehand, RN Outcome: Adequate for Discharge   Problem: Skin Integrity: Goal: Risk for impaired skin integrity will decrease 07/26/2020 2010 by Myriam Forehand, RN Outcome: Completed/Met 07/26/2020 2009 by Myriam Forehand, RN Outcome: Adequate for Discharge 07/26/2020 2008 by Myriam Forehand, RN Outcome: Adequate for Discharge

## 2020-07-26 NOTE — Progress Notes (Signed)
[facility]  PROGRESS NOTE  Nicole [name]:[id number] DOB: 03/10/1930 DOA: 07/20/2020 PCP: Lurena Nida, MD Admit date - 07/20/2020   Admitting Physician Therisa Doyne, MD  Outpatient Primary MD for the patient is Lurena Nida, MD  LOS - 6 Brief Narrative   Nicole [name] is a 86 y.o. year old female with medical history significant for HTN, HLD thyroidism who presented on 07/20/2020 with new left-sided weakness for 1 day and was found to have acute CVA secondary to small vessel disease.  Hospital course complicated by worsening left-sided weakness with repeat MRI on 9/18 showing expansion of prior stroke with no new infarct now with left hemiplegia.  Currently awaiting insurance authorization for CIR.     Subjective  Feels her left knee pain worse today. Was able to sit in bedside chair yesterday for 45 minutes before it got uncomfortable from her back.  Having good BMs, eating well.   A & P   Acute CVA with residual left-sided deficits (left hemiplegia), stable.  Occurred in setting of hypertension and hyperlipidemia and MRI showed small vessel infarct in the posterior right corona radiata and lentiform CTA neck shows ganglionic level infarction (caudate, lentiform nuclei, internal capsule, insula) with no signs of large vessel occlusion or high-grade stenosis. Repeat MRI on 9/18 shows mildly expanded involvement of the right posterior corona radiata area.  Patient has left-sided hemiplegia. .  A1c 5.4, LDL 125.  TTE with preserved EF -Continue aspirin, Plavix 70 mg daily as well as BMS investigational meds, continue current regiment per clinical trial protocol  -Continue high intensity atorvastatin started in the hospital -Continue monitoring on telemetry -PT recommends CIR -CIR consulted--awaiting insurance authorization -Follow-up in stroke clinic with Dr. Pearlean Brownie at [facility] in about 4 weeks   Hypertension, currently at goal. -- currently allowing permissive  hypertension up to 220/120 as directed by neurology -Holding home BP medications  Hypothyroidism,stable -Continue home Synthroid  Hypokalemia mild, resolved likely in setting of home HCTZ magnesium within normal limits -Repleted with potassium -Monitor BMP  Leukocytosis, resolved.  Suspect stress related to acute CVA.  Has no localizing symptoms of infection.  Remains afebrile.  UA unremarkable.  Chest x-ray unremarkable. -Monitor CBC  Chronic Left knee pain, from severe OA, persists. Seemed to be doing well on voltaren and acetaminophen, no swelling on exam to suggest DVT, likely worsen given left hemiplegia. Discussing possible knee replacement as outpatient prior to hospitalization per family.  -Continue voltaren gel and acetaminophen PRN for mild pain -Add tramadol for severe pain ( she wants to avoid opioids)    Family Communication  : Daughter updated via phone on 9/19, will update today as well Code Status : Full  Disposition Plan  :  Patient is from home. Anticipated d/c date:  Medically stable, currently awaiting ability to go to CIR barriers to d/c or necessity for inpatient status:  PT recommends CIR, currently awaiting insurance auth Consults  : Neurology  Procedures  : TTE 9/16 DVT Prophylaxis  : Aspirin, Plavix  Lab Results  Component Value Date   PLT 253 07/24/2020    Diet :  Diet Order            Diet Heart Room service appropriate? Yes; Fluid consistency: Thin  Diet effective now                  Inpatient Medications Scheduled Meds: . atorvastatin  80 mg Oral Daily  . levothyroxine  50 mcg Oral QAC breakfast  .  oxybutynin  5 mg Oral QHS  . polyethylene glycol  17 g Oral BID  . senna-docusate  2 tablet Oral BID  . STUDY - AXIOMATIC - aspirin 100mg  (PI - Sethi)  100 mg Oral QAC breakfast  . STUDY - AXIOMATIC or placebo (PI - Sethi)  2 capsule Oral BID  . STUDY - AXIOMATIC - clopidogrel 75 mg (PI - Sethi)  75 mg Oral Q24H   Continuous  Infusions:  PRN Meds:.acetaminophen **OR** acetaminophen (TYLENOL) oral liquid 160 mg/5 mL **OR** acetaminophen, albuterol, traMADol  Antibiotics  :   Anti-infectives (From admission, onward)   None       Objective   Vitals:   07/25/20 1933 07/26/20 0006 07/26/20 0348 07/26/20 0751  BP: 123/70 139/61 125/72 135/63  Pulse: 86 82 81 83  Resp: 18 18 18 16   Temp: 97.6 F (36.4 C) 98.4 F (36.9 C) 98.1 F (36.7 C) 98 F (36.7 C)  TempSrc: Oral Oral Oral Oral  SpO2: 95% 93% 96% 92%  Weight:      Height:        SpO2: 92 %  Wt Readings from Last 3 Encounters:  07/22/20 69.1 kg  07/06/15 70.8 kg     Intake/Output Summary (Last 24 hours) at 07/26/2020 0906 Last data filed at 07/26/2020 0006 Gross per 24 hour  Intake 480 ml  Output 800 ml  Net -320 ml    Physical Exam:     Awake Alert, Oriented X 3, Normal affect Left hemiplegia, no obvious facial droop, normal speech  [place].AT, Normal respiratory effort on room air, CTAB RRR,No Gallops,Rubs or new Murmurs,  +ve B.Sounds, Abd Soft, No tenderness, No rebound, guarding or rigidity. No Cyanosis, No new Rash or bruise Left knee with no erythema, no effusion, no swelling of surrounding area or leg   I have personally reviewed the following:   Data Reviewed:  CBC Recent Labs  Lab 07/20/20 2001 07/20/20 2008 07/22/20 0431 07/23/20 0132 07/24/20 0617  WBC 10.8*  --  10.7* 9.4 8.0  HGB 13.3 14.3 12.9 12.5 12.6  HCT 42.0 42.0 41.2 39.2 39.6  PLT 252  --  228 233 253  MCV 96.3  --  97.2 95.6 95.7  MCH 30.5  --  30.4 30.5 30.4  MCHC 31.7  --  31.3 31.9 31.8  RDW 12.2  --  12.5 12.4 12.5  LYMPHSABS 3.5  --  3.0 2.7 2.4  MONOABS 0.6  --  0.8 0.6 0.5  EOSABS 0.2  --  0.2 0.1 0.2  BASOSABS 0.1  --  0.1 0.1 0.1    Chemistries  Recent Labs  Lab 07/20/20 2001 07/20/20 2008 07/21/20 1412 07/22/20 0431 07/22/20 1213 07/23/20 0132  NA 137 139  --  137  --  138  K 3.3* 3.3*  --  3.4*  --  3.9  CL 100 102  --   105  --  105  CO2 25  --   --  23  --  24  GLUCOSE 139* 136*  --  112*  --  106*  BUN 16 19  --  8  --  13  CREATININE 0.92 0.80  --  0.66  --  0.85  CALCIUM 10.0  --   --  9.3  --  9.5  MG  --   --  2.1  --  2.1  --   AST 30  --   --   --   --   --  ALT 23  --   --   --   --   --   ALKPHOS 48  --   --   --   --   --   BILITOT 1.1  --   --   --   --   --    ------------------------------------------------------------------------------------------------------------------ No results for input(s): CHOL, HDL, LDLCALC, TRIG, CHOLHDL, LDLDIRECT in the last 72 hours.  Lab Results  Component Value Date   HGBA1C 5.4 07/21/2020   ------------------------------------------------------------------------------------------------------------------ No results for input(s): TSH, T4TOTAL, T3FREE, THYROIDAB in the last 72 hours.  Invalid input(s): FREET3 ------------------------------------------------------------------------------------------------------------------ No results for input(s): VITAMINB12, FOLATE, FERRITIN, TIBC, IRON, RETICCTPCT in the last 72 hours.  Coagulation profile Recent Labs  Lab 07/20/20 2001  INR 0.9    No results for input(s): DDIMER in the last 72 hours.  Cardiac Enzymes No results for input(s): CKMB, TROPONINI, MYOGLOBIN in the last 168 hours.  Invalid input(s): CK ------------------------------------------------------------------------------------------------------------------ No results found for: BNP  Micro Results Recent Results (from the past 240 hour(s))  SARS Coronavirus 2 by RT PCR (hospital order, performed in [name] hospital lab) Nasopharyngeal Nasopharyngeal Swab     Status: None   Collection Time: 07/21/20  2:16 AM   Specimen: Nasopharyngeal Swab  Result Value Ref Range Status   SARS Coronavirus 2 NEGATIVE NEGATIVE Final    Comment: (NOTE) SARS-CoV-2 target nucleic acids are NOT DETECTED.  The SARS-CoV-2 RNA is generally detectable in  upper and lower respiratory specimens during the acute phase of infection. The lowest concentration of SARS-CoV-2 viral copies this assay can detect is 250 copies / mL. A negative result does not preclude SARS-CoV-2 infection and should not be used as the sole basis for treatment or other patient management decisions.  A negative result may occur with improper specimen collection / handling, submission of specimen other than nasopharyngeal swab, presence of viral mutation(s) within the areas targeted by this assay, and inadequate number of viral copies (<250 copies / mL). A negative result must be combined with clinical observations, patient history, and epidemiological information.  Fact Sheet for Patients:   BoilerBrush.com.cy  Fact Sheet for Healthcare Providers: https://pope.com/  This test is not yet approved or  cleared by the [place] FDA and has been authorized for detection and/or diagnosis of SARS-CoV-2 by FDA under an Emergency Use Authorization (EUA).  This EUA will remain in effect (meaning this test can be used) for the duration of the COVID-19 declaration under Section 564(b)(1) of the Act, 21 U.S.C. section 360bbb-3(b)(1), unless the authorization is terminated or revoked sooner.  Performed at [facility], [address] [place], [place] [address]     Radiology Reports DG Chest 2 View  Result Date: 07/21/2020 CLINICAL DATA:  CVA symptoms EXAM: CHEST - 2 VIEW COMPARISON:  None. FINDINGS: Frontal and lateral views of the chest demonstrate an unremarkable cardiac silhouette. Dense atherosclerosis of the aortic arch. No airspace disease, effusion, or pneumothorax. No acute bony abnormalities. IMPRESSION: 1. No acute intrathoracic process. Electronically Signed   By: Sharlet Salina M.D.   On: 07/21/2020 02:18   CT HEAD WO CONTRAST  Result Date: 07/23/2020 CLINICAL DATA:  Stroke follow-up EXAM: CT HEAD WITHOUT  CONTRAST TECHNIQUE: Contiguous axial images were obtained from the base of the skull through the vertex without intravenous contrast. COMPARISON:  MRI head 07/22/2020, 07/20/2020 FINDINGS: Brain: Hypodensity right posterior lentiform nucleus again noted compatible with recent infarct. No change in size. No new area of infarct or  hemorrhage. Mild atrophy. Chronic microvascular ischemic changes throughout the white matter. Chronic infarct right internal capsule anteriorly. Negative for mass lesion. Vascular: Negative for hyperdense vessel Skull: Negative Sinuses/Orbits: Paranasal sinuses clear. Bilateral cataract extraction Other: None IMPRESSION: Acute infarct in the right lentiform nucleus unchanged from recent MRI. No acute hemorrhage Atrophy and chronic microvascular ischemic change in the white matter. Electronically Signed   By: Marlan Palau M.D.   On: 07/23/2020 13:07   MR BRAIN WO CONTRAST  Result Date: 07/23/2020 CLINICAL DATA:  86 year old female with code stroke presentation, left side weakness, small vessel ischemia in the posterior right corona radiata and lentiform diagnosed on MRI 07/20/2020. EXAM: LIMITED MRI HEAD WITHOUT CONTRAST TECHNIQUE: Diffusion-weighted imaging only requested by neurology. COMPARISON:  Brain MRI yesterday, 07/20/2020. FINDINGS: Axial and coronal DWI. Restricted diffusion has increased in intensity since 07/20/2020 in the affected area from the posterior right corona radiata through the right lentiform. Compare series 7, image 53 today to series 7, image 53 on 07/20/2020. The area of involvement has mildly expanded. But there is no new restricted diffusion elsewhere. No intracranial mass effect. IMPRESSION: Increased intensity and mildly expanded diffusion restriction from the right posterior corona radiata through the right lentiform since 07/20/2020. No new areas of ischemia.  No intracranial mass effect. Electronically Signed   By: Odessa Fleming M.D.   On: 07/23/2020 15:45    MR BRAIN WO CONTRAST  Result Date: 07/22/2020 CLINICAL DATA:  86 year old female. BMS AXIOMATIC STROKE STUDY PROTOCOL. EXAM: MRI HEAD WITHOUT CONTRAST TECHNIQUE: Multiplanar, multiecho pulse sequences of the brain and surrounding structures were obtained without intravenous contrast. COMPARISON:  Brain MRI 07/20/2020. FINDINGS: Curvilinear restricted diffusion redemonstrated from the posterior corona radiata to the posterior right lentiform. DWI appearance not significantly changed. No associated mass effect. No acute or chronic blood products identified on T2 * GRE. No other restricted diffusion. Increased FLAIR hyperintensity from 2 days ago. Elsewhere stable gray and white matter signal on axial FLAIR and T1 weighted imaging. IMPRESSION: Expected evolution of recent small vessel infarct affecting the posterior right corona radiata through lentiform. No associated hemorrhage or mass effect. Electronically Signed   By: Odessa Fleming M.D.   On: 07/22/2020 21:38   MR BRAIN WO CONTRAST  Result Date: 07/20/2020 CLINICAL DATA:  86 year old female code stroke presentation, left side weakness. EXAM: MRI HEAD WITHOUT CONTRAST TECHNIQUE: Multiplanar, multiecho pulse sequences of the brain and surrounding structures were obtained without intravenous contrast. COMPARISON:  CT head, CTA head and neck earlier today. FINDINGS: Brain: Discontinuous curvilinear restricted diffusion is noted from the posterior right corona radiata tracking into the posterior right lentiform, best seen on series 7, image 53). Mild if any associated T2 and FLAIR hyperintensity. No hemorrhage or mass effect. No other restricted diffusion. Patchy and scattered bilateral cerebral white matter T2 and FLAIR hyperintensity elsewhere. No cortical encephalomalacia. No chronic cerebral blood products. Moderate T2 heterogeneity in the bilateral deep gray nuclei, most resembling lacunar infarcts in the basal ganglia. Brainstem and cerebellum are within  normal limits for age. No midline shift, mass effect, evidence of mass lesion, ventriculomegaly, extra-axial collection or acute intracranial hemorrhage. Cervicomedullary junction and pituitary are within normal limits. Vascular: Major intracranial vascular flow voids are preserved. Skull and upper cervical spine: Partially visible multilevel cervical spine degeneration. Mild degenerative spinal stenosis suspected at C4-C5. Normal bone marrow signal. Sinuses/Orbits: Postoperative changes to both globes, otherwise negative. Other: Visible internal auditory structures appear normal. Visible scalp and face appear negative. IMPRESSION: 1. Positive for  acute small vessel infarct in the posterior right corona radiata and lentiform. No associated hemorrhage or mass effect. 2. Underlying chronic small vessel disease, including in the bilateral basal ganglia. 3. Mild degenerative spinal stenosis suspected at C4-C5. Electronically Signed   By: Odessa Fleming M.D.   On: 07/20/2020 22:47   ECHOCARDIOGRAM COMPLETE  Result Date: 07/21/2020    ECHOCARDIOGRAM REPORT   Patient Name:   BELLAMIE TURNEY Date of Exam: 07/21/2020 Medical Rec #:  [id number]     Height:       64.0 in Accession #:    [id number]    Weight:       156.0 lb Date of Birth:  11/27/1929      BSA:          1.760 m Patient Age:    89 years      BP:           143/79 mmHg Patient Gender: F             HR:           81 bpm. Exam Location:  Inpatient Procedure: 2D Echo Indications:    stroke 434.91  History:        Patient has no prior history of Echocardiogram examinations.                 Risk Factors:Hypertension.  Sonographer:    Celene Skeen RDCS (AE) Referring Phys: [id number] ANASTASSIA DOUTOVA IMPRESSIONS  1. Left ventricular ejection fraction, by estimation, is 60 to 65%. The left ventricle has normal function. The left ventricle has no regional wall motion abnormalities. Left ventricular diastolic parameters are indeterminate.  2. Right ventricular systolic function is  normal. The right ventricular size is mildly enlarged.  3. The mitral valve is normal in structure. Mild mitral valve regurgitation.  4. The aortic valve is abnormal. Aortic valve regurgitation is not visualized. Mild aortic valve sclerosis is present, with no evidence of aortic valve stenosis.  5. The inferior vena cava is normal in size with greater than 50% respiratory variability, suggesting right atrial pressure of 3 mmHg. FINDINGS  Left Ventricle: Left ventricular ejection fraction, by estimation, is 60 to 65%. The left ventricle has normal function. The left ventricle has no regional wall motion abnormalities. The left ventricular internal cavity size was normal in size. There is  no left ventricular hypertrophy. Left ventricular diastolic parameters are indeterminate. Right Ventricle: The right ventricular size is mildly enlarged. No increase in right ventricular wall thickness. Right ventricular systolic function is normal. Left Atrium: Left atrial size was normal in size. Right Atrium: Right atrial size was normal in size. Pericardium: There is no evidence of pericardial effusion. Mitral Valve: The mitral valve is normal in structure. Mild mitral valve regurgitation. Tricuspid Valve: The tricuspid valve is grossly normal. Tricuspid valve regurgitation is trivial. Aortic Valve: The aortic valve is abnormal. Aortic valve regurgitation is not visualized. Mild aortic valve sclerosis is present, with no evidence of aortic valve stenosis. Pulmonic Valve: The pulmonic valve was not well visualized. Pulmonic valve regurgitation is not visualized. Aorta: The aortic root and ascending aorta are structurally normal, with no evidence of dilitation and the aortic root is normal in size and structure. Venous: The inferior vena cava is normal in size with greater than 50% respiratory variability, suggesting right atrial pressure of 3 mmHg. IAS/Shunts: The interatrial septum was not assessed.  LEFT VENTRICLE PLAX 2D  LVIDd:         3.83  cm  Diastology LVIDs:         2.52 cm  LV e' medial:    6.74 cm/s LV PW:         0.81 cm  LV E/e' medial:  9.8 LV IVS:        0.88 cm  LV e' lateral:   7.72 cm/s LVOT diam:     1.90 cm  LV E/e' lateral: 8.5 LV SV:         55 LV SV Index:   31 LVOT Area:     2.84 cm  RIGHT VENTRICLE TAPSE (M-mode): 2.5 cm LEFT ATRIUM             Index       RIGHT ATRIUM           Index LA diam:        3.00 cm 1.70 cm/m  RA Area:     12.80 cm LA Vol (A2C):   53.9 ml 30.62 ml/m RA Volume:   29.10 ml  16.53 ml/m LA Vol (A4C):   24.5 ml 13.92 ml/m LA Biplane Vol: 36.8 ml 20.91 ml/m  AORTIC VALVE LVOT Vmax:   80.30 cm/s LVOT Vmean:  61.000 cm/s LVOT VTI:    0.194 m  AORTA Ao Root diam: 2.80 cm MITRAL VALVE MV Area (PHT): 2.83 cm    SHUNTS MV Decel Time: 268 msec    Systemic VTI:  0.19 m MV E velocity: 66.00 cm/s  Systemic Diam: 1.90 cm MV A velocity: 80.60 cm/s MV E/A ratio:  0.82 Dietrich PatesPaula Ross MD Electronically signed by Dietrich PatesPaula Ross MD Signature Date/Time: 07/21/2020/3:17:28 PM    Final    CT HEAD CODE STROKE WO CONTRAST  Result Date: 07/20/2020 CLINICAL DATA:  Code stroke.  Left-sided weakness and slurred speech EXAM: CT HEAD WITHOUT CONTRAST CT ANGIOGRAPHY OF THE HEAD AND NECK TECHNIQUE: Contiguous axial images were obtained from the base of the skull through the vertex without intravenous contrast. Multidetector CT imaging of the head and neck was performed using the standard protocol during bolus administration of intravenous contrast. Multiplanar CT image reconstructions and MIPs were obtained to evaluate the vascular anatomy. Carotid stenosis measurements (when applicable) are obtained utilizing NASCET criteria, using the distal internal carotid diameter as the denominator. CONTRAST:  50mL OMNIPAQUE IOHEXOL 350 MG/ML SOLN COMPARISON:  None. FINDINGS: CT HEAD FINDINGS Brain: There is no mass, hemorrhage or extra-axial collection. The size and configuration of the ventricles and extra-axial CSF spaces are  normal. There is hypoattenuation of the periventricular white matter, most commonly indicating chronic ischemic microangiopathy. Vascular: No abnormal hyperdensity of the major intracranial arteries or dural venous sinuses. No intracranial atherosclerosis. Skull: The visualized skull base, calvarium and extracranial soft tissues are normal. Sinuses/Orbits: No fluid levels or advanced mucosal thickening of the visualized paranasal sinuses. No mastoid or middle ear effusion. The orbits are normal. ASPECTS (Alberta Stroke Program Early CT Score) - Ganglionic level infarction (caudate, lentiform nuclei, internal capsule, insula, M1-M3 cortex): 7 - Supraganglionic infarction (M4-M6 cortex): 3 Total score (0-10 with 10 being normal): 10 CTA NECK FINDINGS SKELETON: There is no bony spinal canal stenosis. No lytic or blastic lesion. OTHER NECK: Normal pharynx, larynx and major salivary glands. No cervical lymphadenopathy. Unremarkable thyroid gland. UPPER CHEST: No pneumothorax or pleural effusion. No nodules or masses. AORTIC ARCH: There is calcific atherosclerosis of the aortic arch. There is no aneurysm, dissection or hemodynamically significant stenosis of the visualized portion of the aorta. Conventional 3 vessel aortic branching  pattern. The visualized proximal subclavian arteries are widely patent. RIGHT CAROTID SYSTEM: No dissection, occlusion or aneurysm. Mild atherosclerotic calcification at the carotid bifurcation without hemodynamically significant stenosis. LEFT CAROTID SYSTEM: No dissection, occlusion or aneurysm. Mild atherosclerotic calcification at the carotid bifurcation without hemodynamically significant stenosis. VERTEBRAL ARTERIES: Left dominant configuration. Both origins are clearly patent. There is no dissection, occlusion or flow-limiting stenosis to the skull base (V1-V3 segments). CTA HEAD FINDINGS POSTERIOR CIRCULATION: --Vertebral arteries: Mild atherosclerotic calcification of the left V4  segment. Otherwise normal. --Inferior cerebellar arteries: Normal. --Basilar artery: Normal. --Superior cerebellar arteries: Normal. --Posterior cerebral arteries (PCA): Normal. ANTERIOR CIRCULATION: --Intracranial internal carotid arteries: Normal. --Anterior cerebral arteries (ACA): Normal. Both A1 segments are present. Patent anterior communicating artery (a-comm). --Middle cerebral arteries (MCA): Normal. VENOUS SINUSES: As permitted by contrast timing, patent. ANATOMIC VARIANTS: Hypoplastic right ACA A1 segment, a common variant. Review of the MIP images confirms the above findings. IMPRESSION: 1. No emergent large vessel occlusion or high-grade stenosis of the head or neck. Aortic Atherosclerosis (ICD10-I70.0). Electronically Signed   By: Deatra Robinson M.D.   On: 07/20/2020 20:34   CT ANGIO HEAD CODE STROKE  Result Date: 07/20/2020 CLINICAL DATA:  Code stroke.  Left-sided weakness and slurred speech EXAM: CT HEAD WITHOUT CONTRAST CT ANGIOGRAPHY OF THE HEAD AND NECK TECHNIQUE: Contiguous axial images were obtained from the base of the skull through the vertex without intravenous contrast. Multidetector CT imaging of the head and neck was performed using the standard protocol during bolus administration of intravenous contrast. Multiplanar CT image reconstructions and MIPs were obtained to evaluate the vascular anatomy. Carotid stenosis measurements (when applicable) are obtained utilizing NASCET criteria, using the distal internal carotid diameter as the denominator. CONTRAST:  16mL OMNIPAQUE IOHEXOL 350 MG/ML SOLN COMPARISON:  None. FINDINGS: CT HEAD FINDINGS Brain: There is no mass, hemorrhage or extra-axial collection. The size and configuration of the ventricles and extra-axial CSF spaces are normal. There is hypoattenuation of the periventricular white matter, most commonly indicating chronic ischemic microangiopathy. Vascular: No abnormal hyperdensity of the major intracranial arteries or dural venous  sinuses. No intracranial atherosclerosis. Skull: The visualized skull base, calvarium and extracranial soft tissues are normal. Sinuses/Orbits: No fluid levels or advanced mucosal thickening of the visualized paranasal sinuses. No mastoid or middle ear effusion. The orbits are normal. ASPECTS (Alberta Stroke Program Early CT Score) - Ganglionic level infarction (caudate, lentiform nuclei, internal capsule, insula, M1-M3 cortex): 7 - Supraganglionic infarction (M4-M6 cortex): 3 Total score (0-10 with 10 being normal): 10 CTA NECK FINDINGS SKELETON: There is no bony spinal canal stenosis. No lytic or blastic lesion. OTHER NECK: Normal pharynx, larynx and major salivary glands. No cervical lymphadenopathy. Unremarkable thyroid gland. UPPER CHEST: No pneumothorax or pleural effusion. No nodules or masses. AORTIC ARCH: There is calcific atherosclerosis of the aortic arch. There is no aneurysm, dissection or hemodynamically significant stenosis of the visualized portion of the aorta. Conventional 3 vessel aortic branching pattern. The visualized proximal subclavian arteries are widely patent. RIGHT CAROTID SYSTEM: No dissection, occlusion or aneurysm. Mild atherosclerotic calcification at the carotid bifurcation without hemodynamically significant stenosis. LEFT CAROTID SYSTEM: No dissection, occlusion or aneurysm. Mild atherosclerotic calcification at the carotid bifurcation without hemodynamically significant stenosis. VERTEBRAL ARTERIES: Left dominant configuration. Both origins are clearly patent. There is no dissection, occlusion or flow-limiting stenosis to the skull base (V1-V3 segments). CTA HEAD FINDINGS POSTERIOR CIRCULATION: --Vertebral arteries: Mild atherosclerotic calcification of the left V4 segment. Otherwise normal. --Inferior cerebellar arteries: Normal. --Basilar artery: Normal. --  Superior cerebellar arteries: Normal. --Posterior cerebral arteries (PCA): Normal. ANTERIOR CIRCULATION: --Intracranial  internal carotid arteries: Normal. --Anterior cerebral arteries (ACA): Normal. Both A1 segments are present. Patent anterior communicating artery (a-comm). --Middle cerebral arteries (MCA): Normal. VENOUS SINUSES: As permitted by contrast timing, patent. ANATOMIC VARIANTS: Hypoplastic right ACA A1 segment, a common variant. Review of the MIP images confirms the above findings. IMPRESSION: 1. No emergent large vessel occlusion or high-grade stenosis of the head or neck. Aortic Atherosclerosis (ICD10-I70.0). Electronically Signed   By: Deatra Robinson M.D.   On: 07/20/2020 20:34   CT ANGIO NECK CODE STROKE  Result Date: 07/20/2020 CLINICAL DATA:  Code stroke.  Left-sided weakness and slurred speech EXAM: CT HEAD WITHOUT CONTRAST CT ANGIOGRAPHY OF THE HEAD AND NECK TECHNIQUE: Contiguous axial images were obtained from the base of the skull through the vertex without intravenous contrast. Multidetector CT imaging of the head and neck was performed using the standard protocol during bolus administration of intravenous contrast. Multiplanar CT image reconstructions and MIPs were obtained to evaluate the vascular anatomy. Carotid stenosis measurements (when applicable) are obtained utilizing NASCET criteria, using the distal internal carotid diameter as the denominator. CONTRAST:  50mL OMNIPAQUE IOHEXOL 350 MG/ML SOLN COMPARISON:  None. FINDINGS: CT HEAD FINDINGS Brain: There is no mass, hemorrhage or extra-axial collection. The size and configuration of the ventricles and extra-axial CSF spaces are normal. There is hypoattenuation of the periventricular white matter, most commonly indicating chronic ischemic microangiopathy. Vascular: No abnormal hyperdensity of the major intracranial arteries or dural venous sinuses. No intracranial atherosclerosis. Skull: The visualized skull base, calvarium and extracranial soft tissues are normal. Sinuses/Orbits: No fluid levels or advanced mucosal thickening of the visualized  paranasal sinuses. No mastoid or middle ear effusion. The orbits are normal. ASPECTS (Alberta Stroke Program Early CT Score) - Ganglionic level infarction (caudate, lentiform nuclei, internal capsule, insula, M1-M3 cortex): 7 - Supraganglionic infarction (M4-M6 cortex): 3 Total score (0-10 with 10 being normal): 10 CTA NECK FINDINGS SKELETON: There is no bony spinal canal stenosis. No lytic or blastic lesion. OTHER NECK: Normal pharynx, larynx and major salivary glands. No cervical lymphadenopathy. Unremarkable thyroid gland. UPPER CHEST: No pneumothorax or pleural effusion. No nodules or masses. AORTIC ARCH: There is calcific atherosclerosis of the aortic arch. There is no aneurysm, dissection or hemodynamically significant stenosis of the visualized portion of the aorta. Conventional 3 vessel aortic branching pattern. The visualized proximal subclavian arteries are widely patent. RIGHT CAROTID SYSTEM: No dissection, occlusion or aneurysm. Mild atherosclerotic calcification at the carotid bifurcation without hemodynamically significant stenosis. LEFT CAROTID SYSTEM: No dissection, occlusion or aneurysm. Mild atherosclerotic calcification at the carotid bifurcation without hemodynamically significant stenosis. VERTEBRAL ARTERIES: Left dominant configuration. Both origins are clearly patent. There is no dissection, occlusion or flow-limiting stenosis to the skull base (V1-V3 segments). CTA HEAD FINDINGS POSTERIOR CIRCULATION: --Vertebral arteries: Mild atherosclerotic calcification of the left V4 segment. Otherwise normal. --Inferior cerebellar arteries: Normal. --Basilar artery: Normal. --Superior cerebellar arteries: Normal. --Posterior cerebral arteries (PCA): Normal. ANTERIOR CIRCULATION: --Intracranial internal carotid arteries: Normal. --Anterior cerebral arteries (ACA): Normal. Both A1 segments are present. Patent anterior communicating artery (a-comm). --Middle cerebral arteries (MCA): Normal. VENOUS SINUSES:  As permitted by contrast timing, patent. ANATOMIC VARIANTS: Hypoplastic right ACA A1 segment, a common variant. Review of the MIP images confirms the above findings. IMPRESSION: 1. No emergent large vessel occlusion or high-grade stenosis of the head or neck. Aortic Atherosclerosis (ICD10-I70.0). Electronically Signed   By: Deatra Robinson M.D.   On: 07/20/2020  20:34     Time Spent in minutes  30     Laverna Peace M.D on 07/26/2020 at 9:06 AM  To page go to [email or web address] - password [facility]

## 2020-07-26 NOTE — Discharge Summary (Signed)
Nicole HamburgerMary J Paige ZOX:[id number] DOB: 02/19/1930 DOA: 07/20/2020  PCP: [name], [name], MD  Admit date: 07/20/2020 Discharge date: 07/26/2020  Admitted From: Home  Disposition:  [facility]  Recommendations for Outpatient Follow-up:  1. Follow up with PCP in 1-2 weeks 2. New medications: please continue to follow the AXIOMATIC protocol regarding aspirin, plavix, placebo/study medication   Home Health:n/a  Equipment/Devices:none  Discharge Condition:stable  CODE STATUS:FULL    Brief/Interim Summary: History of present illness:  Nicole [name] is a 86 y.o. year old female with medical history significant for HTN, HLD thyroidism who presented on 07/20/2020 with new left-sided weakness for 1 day and was found to have acute CVA secondary to small vessel disease.  Hospital course complicated by worsening left-sided weakness with repeat MRI on 9/18 showing expansion of prior stroke with no new infarct. Patient enrolled in AXIOMATIC study  Remaining hospital course addressed in problem based format below:   Hospital Course:   Acute CVA with residual left-sided deficits (left sided weakness), stable.  Occurred in setting of hypertension and hyperlipidemia and MRI showed small vessel infarct in the posterior right corona radiata and lentiform CTA neck shows ganglionic level infarction (caudate, lentiform nuclei, internal capsule, insula) with no signs of large vessel occlusion or high-grade stenosis. Repeat MRI on 9/18 shows mildly expanded involvement of the right posterior corona radiata area.  Patient now has left-sided weakness. .  A1c 5.4, LDL 125.  TTE with preserved EF -Continue aspirin, Plavix  mg daily as well as BMS investigational meds, continue current regimen per AXIOMATIC clinical trial protocol  -Continue high intensity atorvastatin started in the hospital -Continue monitoring on telemetry -PT recommends CIR, admitted to CIR -[facility] stroke [facility]  [facility] about 4 weeks   Hypertension, currently at goal. -- currently allowing permissive hypertension up to 220/120 as directed by neurology -Holding home BP medications  Hypothyroidism,stable -Continue home Synthroid  Hypokalemia mild, resolved likely in setting of home HCTZ magnesium within normal limits -Repleted with potassium -Monitor BMP  Leukocytosis, resolved.  Suspect stress related to acute CVA.  Has no localizing symptoms of infection.  Remains afebrile.  UA unremarkable.  Chest x-ray unremarkable. -Monitor CBC  Chronic Left knee pain, from severe OA, persists. Seemed to be doing well on voltaren and acetaminophen, no swelling on exam to suggest DVT, likely worsen given left sided weakness and inability to move. Discussing possible knee replacement as outpatient prior to hospitalization per family.  -Continue voltaren gel and acetaminophen PRN for mild pain - tramadol PRN for severe pain ( she wants to avoid opioids), continue PRN bowel regimen   Consultations:  NEurology  Procedures/Studies: TTE 9/16 Subjective: Feels well. Ready to work with rehab. Still having left knee pain Discharge Exam: Vitals:   07/26/20 0751 07/26/20 1154  BP: 135/63 (!) 122/59  Pulse: 83 75  Resp: 16 18  Temp: 98 F (36.7 C) 98 F (36.7 C)  SpO2: 92% 95%   Vitals:   07/26/20 0006 07/26/20 0348 07/26/20 0751 07/26/20 1154  BP: 139/61 125/72 135/63 (!) 122/59  Pulse: 82 81 83 75  Resp: 18 18 16 18   Temp: 98.4 F (36.9 C) 98.1 F (36.7 C) 98 F (36.7 C) 98 F (36.7 C)  TempSrc: Oral Oral Oral Oral  SpO2: 93% 96% 92% 95%  Weight:      Height:        General: Lying in bed, no apparent distress Eyes: EOMI, anicteric ENT: Oral Mucosa clear  and moist Cardiovascular: regular rate and rhythm, no murmurs, rubs or gallops, no edema, Respiratory: Normal respiratory effort on room air, lungs clear to auscultation bilaterally Abdomen: soft, non-distended, non-tender, normal bowel  sounds Skin: No Rash Neurologic: Mental status AAOx3, speech normal, 1/5 strength in upper and lower extremities on left. Strength preserved on right side Psychiatric:Appropriate affect, and mood  Discharge Diagnoses:  Active Problems:   Hypokalemia   CVA (cerebral vascular accident) [facility])   Essential hypertension   Hypothyroidism   Left hemiplegia (HCC)   Left knee pain    Discharge Instructions  Discharge Instructions    Ambulatory referral to Neurology   Complete by: As directed    [facility] with [facility] in 4 weeks. Thanks.   Diet - low sodium heart healthy   Complete by: As directed    Increase activity slowly   Complete by: As directed      Allergies as of 07/26/2020   No Known Allergies     Medication List    STOP taking these medications   amLODipine 10 MG tablet Commonly known as: NORVASC   aspirin 81 MG EC tablet   cefdinir 300 MG capsule Commonly known as: OMNICEF   CeleBREX 200 MG capsule Generic drug: celecoxib   celecoxib 200 MG capsule Commonly known as: CELEBREX   Centrum Silver Ultra Womens Tabs   Diclofenac Sodium CR 100 MG 24 hr tablet   hydrochlorothiazide 25 MG tablet Commonly known as: HYDRODIURIL   ibandronate 150 MG tablet Commonly known as: BONIVA   pravastatin 40 MG tablet Commonly known as: PRAVACHOL     TAKE these medications   atorvastatin 80 MG tablet Commonly known as: LIPITOR Take 1 tablet (80 mg total) by mouth daily. Start taking on: July 27, 2020   cetirizine 10 MG tablet Commonly known as: ZYRTEC Take 1 tablet (10 mg total) by mouth daily as needed for allergies or rhinitis.   diclofenac Sodium 1 % Gel Commonly known as: Voltaren Apply 2 g topically 4 (four) times daily as needed (to affected areas- for pain).   Investigational - Study Medication Study name: Visual merchandiser Additional study details: Axiomatic Study   Investigational - Study Medication Study name: Visual merchandiser Study Additional  study details: Axiomatic Study   Investigational - Study Medication Study name: Visual merchandiser Study Additional study details:Axiomatic Study   levothyroxine 50 MCG tablet Commonly known as: SYNTHROID Take 50 mcg by mouth daily before breakfast. What changed: Another medication with the same name was removed. Continue taking this medication, and follow the directions you see here.   oxybutynin 5 MG 24 hr tablet Commonly known as: DITROPAN-XL Take 5 mg by mouth at bedtime.   polyethylene glycol 17 g packet Commonly known as: MIRALAX / GLYCOLAX Take 17 g by mouth daily as needed.   ProAir HFA 108 (90 Base) MCG/ACT inhaler Generic drug: albuterol Inhale 1 puff into the lungs every 4 (four) hours as needed for wheezing or shortness of breath.   senna-docusate 8.6-50 MG tablet Commonly known as: Senokot-S Take 2 tablets by mouth 2 (two) times daily.   traMADol 50 MG tablet Commonly known as: ULTRAM Take 1 tablet (50 mg total) by mouth every 6 (six) hours as needed for severe pain.   VITAMIN B-12 PO Take 1 tablet by mouth daily.   VITAMIN D-3 PO Take 1 capsule by mouth daily.       Follow-up Information    Micki Riley, MD. Schedule an appointment as soon as  possible for a visit in 4 week(s).   Specialties: Neurology, Radiology Contact information: [address] [place] [address] [phone]              No Known Allergies      The results of significant diagnostics from this hospitalization (including imaging, microbiology, ancillary and laboratory) are listed below for reference.     Microbiology: Recent Results (from the past 240 hour(s))  SARS Coronavirus 2 by RT PCR (hospital order, performed in [facility] hospital lab) Nasopharyngeal Nasopharyngeal Swab     Status: None   Collection Time: 07/21/20  2:16 AM   Specimen: Nasopharyngeal Swab  Result Value Ref Range Status   SARS Coronavirus 2 NEGATIVE NEGATIVE Final    Comment:  (NOTE) SARS-CoV-2 target nucleic acids are NOT DETECTED.  The SARS-CoV-2 RNA is generally detectable in upper and lower respiratory specimens during the acute phase of infection. The lowest concentration of SARS-CoV-2 viral copies this assay can detect is 250 copies / mL. A negative result does not preclude SARS-CoV-2 infection and should not be used as the sole basis for treatment or other patient management decisions.  A negative result may occur with improper specimen collection / handling, submission of specimen other than nasopharyngeal swab, presence of viral mutation(s) within the areas targeted by this assay, and inadequate number of viral copies (<250 copies / mL). A negative result must be combined with clinical observations, patient history, and epidemiological information.  Fact Sheet for Patients:   BoilerBrush.com.cy  Fact Sheet for Healthcare Providers: https://pope.com/  This test is not yet approved or  cleared by the [place] FDA and has been authorized for detection and/or diagnosis of SARS-CoV-2 by FDA under an Emergency Use Authorization (EUA).  This EUA will remain in effect (meaning this test can be used) for the duration of the COVID-19 declaration under Section 564(b)(1) of the Act, 21 U.S.C. section 360bbb-3(b)(1), unless the authorization is terminated or revoked sooner.  Performed at [facility], [address], [place], [place] [address]      Labs: BNP (last 3 results) No results for input(s): BNP in the last 8760 hours. Basic Metabolic Panel: Recent Labs  Lab 07/20/20 2001 07/20/20 2008 07/21/20 1412 07/22/20 0431 07/22/20 1213 07/23/20 0132  NA 137 139  --  137  --  138  K 3.3* 3.3*  --  3.4*  --  3.9  CL 100 102  --  105  --  105  CO2 25  --   --  23  --  24  GLUCOSE 139* 136*  --  112*  --  106*  BUN 16 19  --  8  --  13  CREATININE 0.92 0.80  --  0.66  --  0.85  CALCIUM  10.0  --   --  9.3  --  9.5  MG  --   --  2.1  --  2.1  --   PHOS  --   --   --   --  2.4*  --    Liver Function Tests: Recent Labs  Lab 07/20/20 2001  AST 30  ALT 23  ALKPHOS 48  BILITOT 1.1  PROT 8.3*  ALBUMIN 4.5   No results for input(s): LIPASE, AMYLASE in the last 168 hours. No results for input(s): AMMONIA in the last 168 hours. CBC: Recent Labs  Lab 07/20/20 2001 07/20/20 2008 07/22/20 0431 07/23/20 0132 07/24/20 0617  WBC 10.8*  --  10.7*  9.4 8.0  NEUTROABS 6.4  --  6.7 6.0 4.8  HGB 13.3 14.3 12.9 12.5 12.6  HCT 42.0 42.0 41.2 39.2 39.6  MCV 96.3  --  97.2 95.6 95.7  PLT 252  --  228 233 253   Cardiac Enzymes: No results for input(s): CKTOTAL, CKMB, CKMBINDEX, TROPONINI in the last 168 hours. BNP: Invalid input(s): POCBNP CBG: Recent Labs  Lab 07/20/20 1937  GLUCAP 140*   D-Dimer No results for input(s): DDIMER in the last 72 hours. Hgb A1c No results for input(s): HGBA1C in the last 72 hours. Lipid Profile No results for input(s): CHOL, HDL, LDLCALC, TRIG, CHOLHDL, LDLDIRECT in the last 72 hours. Thyroid function studies No results for input(s): TSH, T4TOTAL, T3FREE, THYROIDAB in the last 72 hours.  Invalid input(s): FREET3 Anemia work up No results for input(s): VITAMINB12, FOLATE, FERRITIN, TIBC, IRON, RETICCTPCT in the last 72 hours. Urinalysis    Component Value Date/Time   COLORURINE STRAW (A) 07/21/2020 0228   APPEARANCEUR CLEAR 07/21/2020 0228   LABSPEC 1.012 07/21/2020 0228   PHURINE 8.0 07/21/2020 0228   GLUCOSEU NEGATIVE 07/21/2020 0228   HGBUR NEGATIVE 07/21/2020 0228   BILIRUBINUR NEGATIVE 07/21/2020 0228   KETONESUR NEGATIVE 07/21/2020 0228   PROTEINUR NEGATIVE 07/21/2020 0228   NITRITE NEGATIVE 07/21/2020 0228   LEUKOCYTESUR NEGATIVE 07/21/2020 0228   Sepsis Labs Invalid input(s): PROCALCITONIN,  WBC,  LACTICIDVEN Microbiology Recent Results (from the past 240 hour(s))  SARS Coronavirus 2 by RT PCR (hospital order,  performed in [facility] hospital lab) Nasopharyngeal Nasopharyngeal Swab     Status: None   Collection Time: 07/21/20  2:16 AM   Specimen: Nasopharyngeal Swab  Result Value Ref Range Status   SARS Coronavirus 2 NEGATIVE NEGATIVE Final    Comment: (NOTE) SARS-CoV-2 target nucleic acids are NOT DETECTED.  The SARS-CoV-2 RNA is generally detectable in upper and lower respiratory specimens during the acute phase of infection. The lowest concentration of SARS-CoV-2 viral copies this assay can detect is 250 copies / mL. A negative result does not preclude SARS-CoV-2 infection and should not be used as the sole basis for treatment or other patient management decisions.  A negative result may occur with improper specimen collection / handling, submission of specimen other than nasopharyngeal swab, presence of viral mutation(s) within the areas targeted by this assay, and inadequate number of viral copies (<250 copies / mL). A negative result must be combined with clinical observations, patient history, and epidemiological information.  Fact Sheet for Patients:   BoilerBrush.com.cy  Fact Sheet for Healthcare Providers: https://pope.com/  This test is not yet approved or  cleared by the [place] FDA and has been authorized for detection and/or diagnosis of SARS-CoV-2 by FDA under an Emergency Use Authorization (EUA).  This EUA will remain in effect (meaning this test can be used) for the duration of the COVID-19 declaration under Section 564(b)(1) of the Act, 21 U.S.C. section 360bbb-3(b)(1), unless the authorization is terminated or revoked sooner.  Performed at [facility], [address] [place], [place] [address]      Time coordinating discharge: Over 30 minutes  SIGNED:   Laverna Peace, MD  [facility] 07/26/2020, 3:03 PM Pager   If 7PM-7AM, please contact night-coverage [email or web address] Password [email or web address]

## 2020-07-26 NOTE — Progress Notes (Signed)
Orthopedic Tech Progress Note Patient Details:  SORIYA WORSTER 1930/03/19 [id number]  Ortho Devices Type of Ortho Device: Prafo boot/shoe Ortho Device/Splint Location: LLE Ortho Device/Splint Interventions: Ordered, Application   Post Interventions Patient Tolerated: Well Instructions Provided: Care of device   Tandre Conly A Felton Buczynski 07/26/2020, 8:49 PM

## 2020-07-26 NOTE — Progress Notes (Signed)
Patient currently on trial drug for stroke.

## 2020-07-26 NOTE — H&P (Signed)
Physical Medicine and Rehabilitation Admission H&P    No chief complaint on file. : HPI: Nicole [name] is an 86 year old right-handed female with history of asthma,glaucoma, hypertension as well as hypothyroidism hyperlipidemia.  Per chart review lives with daughter.  Two-level home bed and bath upstairs.  Independent with ADLs and still driving.  Presented 07/20/2020 left-sided weakness.  CT/MRI showed positive acute small vessel infarct in the posterior right corona radiata and lentiform.  No associated hemorrhage or mass-effect.  CT angiogram of head and neck no emergent large vessel occlusion or high-grade stenosis.  Echocardiogram with ejection fraction of 60 to 65% no wall motion abnormalities.  Admission chemistries potassium 3.3 glucose 139 WBC 10,800 urine drug screen negative SARS coronavirus negative.  Currently maintained on aspirin and Plavix for CVA prophylaxis as well as BMS investigational med.  Latest follow-up MRI 07/23/2020 showed slight increase intensity and mildly expanded diffusion restriction from the right posterior corona radiata throughout the right lentiform as compared to prior scan of 07/20/2020.  No new areas of ischemia.  Tolerating regular diet.  Therapy evaluations completed and patient was admitted for a comprehensive rehab program   Pt denies pain- LBM yesterday- voiding well- using purewick   Review of Systems  Constitutional: Negative for chills and fever.  HENT: Negative for hearing loss.   Eyes: Negative for blurred vision, double vision and discharge.  Respiratory: Negative for shortness of breath.   Cardiovascular: Negative for chest pain, palpitations and leg swelling.  Gastrointestinal: Positive for constipation. Negative for heartburn, nausea and vomiting.  Genitourinary: Positive for urgency. Negative for dysuria, flank pain and hematuria.  Musculoskeletal: Positive for joint pain and myalgias.  Skin: Negative for rash.  Neurological: Positive  for weakness.  All other systems reviewed and are negative.  History reviewed. No pertinent past medical history. History reviewed. No pertinent surgical history. Family History  Problem Relation Age of Onset  . Diabetes Mother   . Diabetes Other   . Hypertension Other    Social History:  reports that she has never smoked. She has never used smokeless tobacco. She reports that she does not drink alcohol and does not use drugs. Allergies: No Known Allergies Medications Prior to Admission  Medication Sig Dispense Refill  . [START ON 07/27/2020] atorvastatin (LIPITOR) 80 MG tablet Take 1 tablet (80 mg total) by mouth daily. 45 tablet 1  . cetirizine (ZYRTEC) 10 MG tablet Take 1 tablet (10 mg total) by mouth daily as needed for allergies or rhinitis.    . Cholecalciferol (VITAMIN D-3 PO) Take 1 capsule by mouth daily.    . Cyanocobalamin (VITAMIN B-12 PO) Take 1 tablet by mouth daily.    . diclofenac Sodium (VOLTAREN) 1 % GEL Apply 2 g topically 4 (four) times daily as needed (to affected areas- for pain).    . Investigational - Study Medication Study name: Axiomatic Additional study details: Axiomatic Study 1 each PRN  . Investigational - Study Medication Study name: Axiomatic Study Additional study details: Axiomatic Study 1 each PRN  . Investigational - Study Medication Study name: Axiomatic Study Additional study details:Axiomatic Study 1 each PRN  . levothyroxine (SYNTHROID) 50 MCG tablet Take 50 mcg by mouth daily before breakfast.     . oxybutynin (DITROPAN-XL) 5 MG 24 hr tablet Take 5 mg by mouth at bedtime.     . polyethylene glycol (MIRALAX / GLYCOLAX) 17 g packet Take 17 g by mouth daily as needed. 14 each 0  . PROAIR HFA 108 (  90 BASE) MCG/ACT inhaler Inhale 1 puff into the lungs every 4 (four) hours as needed for wheezing or shortness of breath.   0  . senna-docusate (SENOKOT-S) 8.6-50 MG tablet Take 2 tablets by mouth 2 (two) times daily.    . traMADol (ULTRAM) 50 MG tablet  Take 1 tablet (50 mg total) by mouth every 6 (six) hours as needed for severe pain. 30 tablet     Drug Regimen Review Drug regimen was reviewed and remains appropriate with no significant issues identified  Home: Home Living Family/patient expects to be discharged to:: Private residence Living Arrangements: Children   Functional History:    Functional Status:  Mobility:          ADL:    Cognition: Cognition Orientation Level: Oriented X4    Physical Exam: Blood pressure 134/72, pulse 72, temperature 98.5 F (36.9 C), temperature source Oral, resp. rate 18, height 5\' 5"  (1.651 m), weight 66.6 kg, SpO2 94 %. Physical Exam Vitals and nursing note reviewed.  Constitutional:      Comments: Elderly female appears younger than stated age, sitting up in bed- appropriate, NAD  HENT:     Head: Normocephalic and atraumatic.     Comments: Mild L facial droop- tongue midline- tongue coated    Right Ear: External ear normal.     Left Ear: External ear normal.     Nose: Nose normal. No congestion.     Mouth/Throat:     Mouth: Mucous membranes are dry.     Pharynx: Oropharynx is clear.  Eyes:     Extraocular Movements: Extraocular movements intact.     Comments: No nystagmus-   Cardiovascular:     Comments: RRR;  no JVD Pulmonary:     Comments: CTA B/L- no W/R/R- good air movement Abdominal:     Comments: Soft, NT, ND, (+)BS hypoactive  Genitourinary:    Comments: purewick in place Musculoskeletal:     Cervical back: Normal range of motion. No rigidity.     Comments: RUE/RLE- 5-/5 in all muscles tested LUE and LLE- 1/5 in biceps, triceps, WE, grip, HF, KE, DF and PF  Skin:    General: Skin is warm and dry.     Comments: IV L antecubital fossa- looks good No wound on bottom; however L heel a little boggy  Neurological:     Comments: Patient is alert in no acute distress.  Makes eye contact with examiner.  Follows simple commands.  Provides her name and age but does  display some decrease in awareness of her deficits. Decreased sensation to light touch in LUE/LLE- facial sensation normal per pt      No results found for this or any previous visit (from the past 48 hour(s)). No results found.     Medical Problem List and Plan: 1.  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  Antithrombotics: -DVT/anticoagulation: SCDs  -antiplatelet therapy: Aspirin 81 mg daily Plavix 85 mg daily and BMS investigational med 3. Pain Management: Tramadol as needed 4. Mood: Provide emotional support  -antipsychotic agents: N/A 5. Neuropsych: This patient is capable of making decisions on her own behalf. 6. Skin/Wound Care: Routine skin checks 7. Fluids/Electrolytes/Nutrition: Routine in and outs with follow-up chemistries 8.  Hyperlipidemia.  Lipitor 9.  Hypothyroidism.  Synthroid 10.  Asthma.  Continue inhaler as needed 11.  Overactive bladder.  Ditropan 12.  Constipation.  MiraLAX twice daily as well as Senokot 2 tablets twice daily. 13.Glaucoma.pt says she doesn't take drops and doesn't have this dx.  14. L foot drop- will order PRAFO.    Mcarthur Rossetti Anguiili, PA-C 07/26/2020   I have personally performed a face to face diagnostic evaluation of this patient and formulated the key components of the plan.  Additionally, I have personally reviewed laboratory data, imaging studies, as well as relevant notes and concur with the physician assistant's documentation above.    Genice Rouge, MD 07/26/2020

## 2020-07-26 NOTE — Progress Notes (Signed)
Inpatient Rehabilitation Admissions Coordinator  I have insurance approval and Cir bed available to admit patient today. I met with patient at bedside and spoke with her daughter by phone and they are in agreement. I have alerted Dr. Lonny Prude, acute team and TOC. I will make the arrangements to admit today.  Danne Baxter, RN, MSN Rehab Admissions Coordinator [phone] 07/26/2020 2:20 PM

## 2020-07-26 NOTE — TOC Transition Note (Signed)
Transition of Care [facility]) - CM/SW Discharge Note   Patient Details  Name: Nicole [name] MRN: [id number] Date of Birth: April 28, 1930  Transition of Care [facility]) CM/SW Contact:  Kermit Balo, RN Phone Number: 07/26/2020, 3:42 PM   Clinical Narrative:    Pt discharging to CIR today. CM signing off.    Final next level of care: IP Rehab Facility Barriers to Discharge: No Barriers Identified   Patient Goals and CMS Choice        Discharge Placement                       Discharge Plan and Services                                     Social Determinants of Health (SDOH) Interventions     Readmission Risk Interventions No flowsheet data found.

## 2020-07-26 NOTE — Progress Notes (Addendum)
Patient arrived just before 1800 via bed with two RN's from previous unit alert and oriented x 4. Small blanchable raised red area noted just right of coccyx.

## 2020-07-26 NOTE — Progress Notes (Signed)
Physical Therapy Treatment Patient Details Name: Nicole [name] MRN: [id number] DOB: [birth year] Today's Date: 07/26/2020    History of Present Illness 86 y.o. female presenting with left sided weakness. MRI positive for acute small vessel infarct in the posterior right corona radiata and lentiform. No associated hemorrhage or mass effect. PMH significant for HTN, hypothyroidism, OA, HLD, glaucoma.    PT Comments    Patient developing flexor synergy/tone in LLE with difficulty achieving full knee extension due to discomfort. Best results with pt in rt sidelying. Focus on bed mobility, activating and ROM LLE (and LUE while in sitting working on balance), sitting balance/trunk control, and lateral scooting/transfer. Patient highly motivated and follows commands with some delay (at times due to Nicole [name]).     Follow Up Recommendations  CIR;Supervision/Assistance - 24 hour     Equipment Recommendations  Other (comment) (TBA)    Recommendations for Other Services       Precautions / Restrictions Precautions Precautions: Fall Precaution Comments: Left side weakness. Inattention to L    Mobility  Bed Mobility Overal bed mobility: Needs Assistance Bed Mobility: Rolling;Sidelying to Sit;Sit to Sidelying Rolling: Mod assist (to right) Sidelying to sit: Mod assist;HOB elevated (after legs over EOB, elevated HOB to 25 and pt pushed up)     Sit to sidelying: Mod assist General bed mobility comments: rolling requires assist to bring LUE across, bend LLE, pt uses RUE on rail  Transfers Overall transfer level: Needs assistance   Transfers: Lateral/Scoot Transfers          Lateral/Scoot Transfers: Max assist General transfer comment: scooting to her right along EOB x 4   Ambulation/Gait                 Stairs             Wheelchair Mobility    Modified Rankin (Stroke Patients Only) Modified Rankin (Stroke Patients Only) Pre-Morbid Rankin Score: No symptoms Modified  Rankin: Severe disability     Balance Overall balance assessment: Needs assistance Sitting-balance support: Feet supported;Single extremity supported Sitting balance-Leahy Scale: Poor Sitting balance - Comments: initial posterior and left lean up to mod assist; progressed to minguard in midline up to 20 sec Postural control: Posterior lean;Left lateral lean                                  Cognition Arousal/Alertness: Awake/alert Behavior During Therapy: WFL for tasks assessed/performed Overall Cognitive Status: No family/caregiver present to determine baseline cognitive functioning Area of Impairment: Problem solving;Awareness;Following commands                       Following Commands: Follows one step commands with increased time;Follows multi-step commands inconsistently;Follows multi-step commands with increased time   Awareness: Emergent Problem Solving: Slow processing;Requires verbal cues;Requires tactile cues;Decreased initiation General Comments: Pt requiring increased time for processing. Pt very motivated to particiapte in therapy session. Pt also present with decreased awareness of L side.       Exercises Other Exercises Other Exercises: LLE-noted held in flexion and with incr pain attempting to extend; +flexor synergy when pt coughed; gentle stretching in supine and Rt sidelying with full knee extension obtained eventually; in hooklying left hip abdct/adduct; heelslides AAROM; heel cord stretch    General Comments General comments (skin integrity, edema, etc.): Eager to work with PT.       Pertinent Vitals/Pain Pain Assessment: Faces Faces  Pain Scale: Hurts even more Pain Location: left knee with stretching into extension Pain Descriptors / Indicators: Discomfort;Grimacing Pain Intervention(s): Limited activity within patient's tolerance;Monitored during session;Premedicated before session;Repositioned    Home Living                       Prior Function            PT Goals (current goals can now be found in the care plan section) Acute Rehab PT Goals Patient Stated Goal: get back independence Time For Goal Achievement: 08/04/20 Potential to Achieve Goals: Good Progress towards PT goals: Progressing toward goals    Frequency    Min 4X/week      PT Plan Current plan remains appropriate    Co-evaluation              AM-PAC PT "6 Clicks" Mobility   Outcome Measure  Help needed turning from your back to your side while in a flat bed without using bedrails?: A Lot Help needed moving from lying on your back to sitting on the side of a flat bed without using bedrails?: A Lot Help needed moving to and from a bed to a chair (including a wheelchair)?: A Lot Help needed standing up from a chair using your arms (e.g., wheelchair or bedside chair)?: A Lot Help needed to walk in hospital room?: Total Help needed climbing 3-5 steps with a railing? : Total 6 Click Score: 10    End of Session   Activity Tolerance: Patient tolerated treatment well Patient left: with call bell/phone within reach;in bed;with bed alarm set (chair position)   PT Visit Diagnosis: Unsteadiness on feet (R26.81);Other abnormalities of gait and mobility (R26.89);Muscle weakness (generalized) (M62.81);Difficulty in walking, not elsewhere classified (R26.2);Hemiplegia and hemiparesis Hemiplegia - Right/Left: Left Hemiplegia - dominant/non-dominant: Non-dominant Hemiplegia - caused by: Cerebral infarction     Time: 0258-5277 PT Time Calculation (min) (ACUTE ONLY): 31 min  Charges:  $Neuromuscular Re-education: 23-37 mins                      Nicole [name], PT Pager [phone]    Nicole [name] 07/26/2020, 1:09 PM

## 2020-07-26 NOTE — Progress Notes (Signed)
Inpatient Rehabilitation Medication Review by a Pharmacist  A complete drug regimen review was completed for this patient to identify any potential clinically significant medication issues.  Clinically significant medication issues were identified:  yes   Type of Medication Issue Identified Description of Issue Urgent (address now) Non-Urgent (address on AM team rounds) Plan Plan Accepted by Provider? (Yes / No / Pending AM Rounds)                                Additional Drug Therapy Needed  Cetirizine, diclofenac Sodium 1 % Gel, VITAMIN B-12 PO, VITAMIN D-3 PO - mentioned to cont in dc summary but was not on before transfer Non-urgent F/u with team in AM   Other         Name of provider notified for urgent issues identified: Dr Berline Chough  Provider Method of Notification: Secure chat in AM   For non-urgent medication issues to be resolved on team rounds tomorrow morning a [facility] Secure Chat Handoff was sent to:    Pharmacist comments: Will message team in AM to see if these meds need to be resumed.   Time spent performing this drug regimen review (minutes):  10   Nicole [name] 07/26/2020 6:20 PM

## 2020-07-26 NOTE — Plan of Care (Signed)
Problem: Education: Goal: Knowledge of disease or condition will improve 07/26/2020 2009 by Becky Augusta, RN Outcome: Adequate for Discharge 07/26/2020 2008 by Becky Augusta, RN Outcome: Adequate for Discharge Goal: Knowledge of secondary prevention will improve 07/26/2020 2009 by Becky Augusta, RN Outcome: Adequate for Discharge 07/26/2020 2008 by Becky Augusta, RN Outcome: Adequate for Discharge Goal: Knowledge of patient specific risk factors addressed and post discharge goals established will improve 07/26/2020 2009 by Becky Augusta, RN Outcome: Adequate for Discharge 07/26/2020 2008 by Becky Augusta, RN Outcome: Adequate for Discharge Goal: Individualized Educational Video(s) 07/26/2020 2009 by Becky Augusta, RN Outcome: Adequate for Discharge 07/26/2020 2008 by Becky Augusta, RN Outcome: Adequate for Discharge   Problem: Coping: Goal: Will verbalize positive feelings about self 07/26/2020 2009 by Becky Augusta, RN Outcome: Adequate for Discharge 07/26/2020 2008 by Becky Augusta, RN Outcome: Adequate for Discharge Goal: Will identify appropriate support needs 07/26/2020 2009 by Becky Augusta, RN Outcome: Adequate for Discharge 07/26/2020 2008 by Becky Augusta, RN Outcome: Adequate for Discharge   Problem: Health Behavior/Discharge Planning: Goal: Ability to manage health-related needs will improve 07/26/2020 2009 by Becky Augusta, RN Outcome: Adequate for Discharge 07/26/2020 2008 by Becky Augusta, RN Outcome: Adequate for Discharge   Problem: Self-Care: Goal: Ability to participate in self-care as condition permits will improve 07/26/2020 2009 by Becky Augusta, RN Outcome: Adequate for Discharge 07/26/2020 2008 by Becky Augusta, RN Outcome: Adequate for Discharge Goal: Verbalization of feelings and concerns over difficulty with self-care will improve 07/26/2020 2009 by Becky Augusta, RN Outcome: Adequate for Discharge 07/26/2020 2008 by Becky Augusta, RN Outcome: Adequate  for Discharge Goal: Ability to communicate needs accurately will improve 07/26/2020 2009 by Becky Augusta, RN Outcome: Adequate for Discharge 07/26/2020 2008 by Becky Augusta, RN Outcome: Adequate for Discharge   Problem: Ischemic Stroke/TIA Tissue Perfusion: Goal: Complications of ischemic stroke/TIA will be minimized 07/26/2020 2009 by Becky Augusta, RN Outcome: Adequate for Discharge 07/26/2020 2008 by Becky Augusta, RN Outcome: Adequate for Discharge   Problem: Education: Goal: Knowledge of General Education information will improve Description: Including pain rating scale, medication(s)/side effects and non-pharmacologic comfort measures 07/26/2020 2009 by Becky Augusta, RN Outcome: Adequate for Discharge 07/26/2020 2008 by Becky Augusta, RN Outcome: Adequate for Discharge   Problem: Health Behavior/Discharge Planning: Goal: Ability to manage health-related needs will improve 07/26/2020 2009 by Becky Augusta, RN Outcome: Adequate for Discharge 07/26/2020 2008 by Becky Augusta, RN Outcome: Adequate for Discharge   Problem: Clinical Measurements: Goal: Ability to maintain clinical measurements within normal limits will improve 07/26/2020 2009 by Becky Augusta, RN Outcome: Adequate for Discharge 07/26/2020 2008 by Becky Augusta, RN Outcome: Adequate for Discharge Goal: Will remain free from infection 07/26/2020 2009 by Becky Augusta, RN Outcome: Adequate for Discharge 07/26/2020 2008 by Becky Augusta, RN Outcome: Adequate for Discharge Goal: Diagnostic test results will improve 07/26/2020 2009 by Becky Augusta, RN Outcome: Adequate for Discharge 07/26/2020 2008 by Becky Augusta, RN Outcome: Adequate for Discharge Goal: Respiratory complications will improve 07/26/2020 2009 by Becky Augusta, RN Outcome: Adequate for Discharge 07/26/2020 2008 by Becky Augusta, RN Outcome: Adequate for Discharge Goal: Cardiovascular complication will be avoided 07/26/2020 2009 by Becky Augusta,  RN Outcome: Adequate for Discharge 07/26/2020 2008 by Becky Augusta, RN Outcome: Adequate for Discharge   Problem: Activity: Goal: Risk for activity intolerance will decrease 07/26/2020 2009 by Becky Augusta, RN Outcome: Adequate for Discharge 07/26/2020 2008 by Becky Augusta, RN Outcome: Adequate for Discharge   Problem: Nutrition: Goal: Adequate nutrition will be maintained 07/26/2020 2009 by Dorna Bloom,  Peng-Sian, RN Outcome: Adequate for Discharge 07/26/2020 2008 by Becky Augusta, RN Outcome: Adequate for Discharge   Problem: Coping: Goal: Level of anxiety will decrease 07/26/2020 2009 by Becky Augusta, RN Outcome: Adequate for Discharge 07/26/2020 2008 by Becky Augusta, RN Outcome: Adequate for Discharge   Problem: Elimination: Goal: Will not experience complications related to bowel motility 07/26/2020 2009 by Becky Augusta, RN Outcome: Adequate for Discharge 07/26/2020 2008 by Becky Augusta, RN Outcome: Adequate for Discharge Goal: Will not experience complications related to urinary retention 07/26/2020 2009 by Becky Augusta, RN Outcome: Adequate for Discharge 07/26/2020 2008 by Becky Augusta, RN Outcome: Adequate for Discharge   Problem: Pain Managment: Goal: General experience of comfort will improve 07/26/2020 2009 by Becky Augusta, RN Outcome: Adequate for Discharge 07/26/2020 2008 by Becky Augusta, RN Outcome: Adequate for Discharge   Problem: Safety: Goal: Ability to remain free from injury will improve 07/26/2020 2009 by Becky Augusta, RN Outcome: Adequate for Discharge 07/26/2020 2008 by Becky Augusta, RN Outcome: Adequate for Discharge   Problem: Skin Integrity: Goal: Risk for impaired skin integrity will decrease 07/26/2020 2009 by Becky Augusta, RN Outcome: Adequate for Discharge 07/26/2020 2008 by Becky Augusta, RN Outcome: Adequate for Discharge

## 2020-07-26 NOTE — Plan of Care (Signed)

## 2020-07-27 ENCOUNTER — Inpatient Hospital Stay (HOSPITAL_COMMUNITY): Payer: Medicare Other | Admitting: Physical Therapy

## 2020-07-27 ENCOUNTER — Inpatient Hospital Stay (HOSPITAL_COMMUNITY): Payer: Medicare Other

## 2020-07-27 LAB — CBC WITH DIFFERENTIAL/PLATELET
Abs Immature Granulocytes: 0.03 10*3/uL (ref 0.00–0.07)
Basophils Absolute: 0.1 10*3/uL (ref 0.0–0.1)
Basophils Relative: 1 %
Eosinophils Absolute: 0.1 10*3/uL (ref 0.0–0.5)
Eosinophils Relative: 1 %
HCT: 38.3 % (ref 36.0–46.0)
Hemoglobin: 12.1 g/dL (ref 12.0–15.0)
Immature Granulocytes: 0 %
Lymphocytes Relative: 19 %
Lymphs Abs: 1.8 10*3/uL (ref 0.7–4.0)
MCH: 30.8 pg (ref 26.0–34.0)
MCHC: 31.6 g/dL (ref 30.0–36.0)
MCV: 97.5 fL (ref 80.0–100.0)
Monocytes Absolute: 0.6 10*3/uL (ref 0.1–1.0)
Monocytes Relative: 6 %
Neutro Abs: 7.1 10*3/uL (ref 1.7–7.7)
Neutrophils Relative %: 73 %
Platelets: 268 10*3/uL (ref 150–400)
RBC: 3.93 MIL/uL (ref 3.87–5.11)
RDW: 12.4 % (ref 11.5–15.5)
WBC: 9.6 10*3/uL (ref 4.0–10.5)
nRBC: 0 % (ref 0.0–0.2)

## 2020-07-27 LAB — COMPREHENSIVE METABOLIC PANEL
ALT: 24 U/L (ref 0–44)
AST: 24 U/L (ref 15–41)
Albumin: 3.2 g/dL — ABNORMAL LOW (ref 3.5–5.0)
Alkaline Phosphatase: 48 U/L (ref 38–126)
Anion gap: 8 (ref 5–15)
BUN: 15 mg/dL (ref 8–23)
CO2: 25 mmol/L (ref 22–32)
Calcium: 9.2 mg/dL (ref 8.9–10.3)
Chloride: 104 mmol/L (ref 98–111)
Creatinine, Ser: 0.75 mg/dL (ref 0.44–1.00)
GFR calc Af Amer: >60 mL/min (ref >60)
GFR calc non Af Amer: >60 mL/min (ref >60)
Glucose, Bld: 106 mg/dL — ABNORMAL HIGH (ref 70–99)
Potassium: 4.1 mmol/L (ref 3.5–5.1)
Sodium: 137 mmol/L (ref 135–145)
Total Bilirubin: 0.7 mg/dL (ref 0.3–1.2)
Total Protein: 7.1 g/dL (ref 6.5–8.1)

## 2020-07-27 MED ORDER — CYANOCOBALAMIN 500 MCG PO TABS
250.0000 ug | ORAL_TABLET | Freq: Every day | ORAL | Status: DC
  Administered 2020-07-27 – 2020-08-17 (×22): 250 ug via ORAL
  Filled 2020-07-27 (×22): qty 1

## 2020-07-27 MED ORDER — POLYETHYLENE GLYCOL 3350 17 G PO PACK
17.0000 g | PACK | Freq: Every day | ORAL | Status: DC
  Administered 2020-07-28 – 2020-07-31 (×3): 17 g via ORAL
  Filled 2020-07-27 (×4): qty 1

## 2020-07-27 MED ORDER — VITAMIN D 25 MCG (1000 UNIT) PO TABS
1000.0000 [IU] | ORAL_TABLET | Freq: Every day | ORAL | Status: DC
  Administered 2020-07-27 – 2020-08-17 (×22): 1000 [IU] via ORAL
  Filled 2020-07-27 (×22): qty 1

## 2020-07-27 MED ORDER — ONDANSETRON 4 MG PO TBDP
4.0000 mg | ORAL_TABLET | Freq: Three times a day (TID) | ORAL | Status: DC | PRN
  Administered 2020-07-27 – 2020-07-28 (×2): 4 mg via ORAL
  Filled 2020-07-27 (×2): qty 1

## 2020-07-27 NOTE — Progress Notes (Signed)
Inpatient Rehabilitation  Patient information reviewed and entered into eRehab system by Dalma Panchal M. Hermena Swint, M.A., CCC/SLP, PPS Coordinator.  Information including medical coding, functional ability and quality indicators will be reviewed and updated through discharge.    

## 2020-07-27 NOTE — Evaluation (Signed)
Speech Language Pathology Assessment and Plan  Patient Details  Name: Nicole [name] MRN: [id number] Date of Birth: 1930-04-09  SLP Diagnosis: Cognitive Impairments  Rehab Potential: Good ELOS: 18-21 days.    Today's Date: 07/27/2020 SLP Individual Time: 1100-1148 SLP Individual Time Calculation (min): 48 min   Hospital Problem: Principal Problem:   Cerebrovascular accident (CVA) of right basal ganglia ([place])  Past Medical History: History reviewed. No pertinent past medical history. Past Surgical History: History reviewed. No pertinent surgical history.  Assessment / Plan / Recommendation Clinical Impression   Nicole [name] is an 86 year old right-handed female with history of asthma,glaucoma, hypertension as well as hypothyroidism hyperlipidemia.  Per chart review lives with daughter.  Two-level home bed and bath upstairs.  Independent with ADLs and still driving.  Presented 07/20/2020 left-sided weakness.  CT/MRI showed positive acute small vessel infarct in the posterior right corona radiata and lentiform.  No associated hemorrhage or mass-effect.  CT angiogram of head and neck no emergent large vessel occlusion or high-grade stenosis.  Echocardiogram with ejection fraction of 60 to 65% no wall motion abnormalities.  Admission chemistries potassium 3.3 glucose 139 WBC 10,800 urine drug screen negative SARS coronavirus negative.  Currently maintained on aspirin and Plavix for CVA prophylaxis as well as BMS investigational med.  Latest follow-up MRI 07/23/2020 showed slight increase intensity and mildly expanded diffusion restriction from the right posterior corona radiata throughout the right lentiform as compared to prior scan of 07/20/2020.  No new areas of ischemia.  Tolerating regular diet.  Therapy evaluations completed and patient was admitted for a comprehensive rehab program   Pt presents with mild-moderate cognitive linguistic impairment characterized by decreased memory, decreased  problem solving, slowed processing, decreased safety awareness. She was oriented x4 and was able to answer all yes/ no and simple questions regarding prior level of function accurately with additional time. Pt is hard of hearing and often needed information to be repeated. Daughter and patient report hypernasality in voice is baseline. Cognistat was administered with results as follows: Pt able to recall 2/4 words after delay and 4/4 with category cue. Pt required max cues for awareness of deficits and reported no noticeable change in cognition or mobility despite left hemiplegia. Pt constructed 3/4 tile designs accurately with increased time on constructional ability subtest. 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 Pt also managed medications independently prior to CVA. Pt is tolerating regular diet and thin liquids. Evaluation ended early due to patient being very lethargic at end of session. Recommend pt receive skilled ST services during inpatient rehab stay.    Skilled Therapeutic Interventions          Cognitive-linguistic evaluation completed and results reviewed with patient and family member.   SLP Assessment  Patient will need skilled [place] [facility] Services during CIR admission    Recommendations  Supervision: Patient able to self feed Patient destination: Home Follow up Recommendations: Home Health SLP Equipment Recommended: None recommended by SLP    SLP Frequency 3 to 5 out of 7 days   SLP Duration  SLP Intensity  SLP Treatment/Interventions 18-21 days.  Minumum of 1-2 x/day, 30 to 90 minutes  Cognitive remediation/compensation;Functional tasks;Therapeutic Activities;Patient/family education    Pain Pain Assessment Pain Scale: Faces Pain Score: 6  Faces Pain Scale: No hurt Pain Location: Leg Pain Orientation: Left  Prior Functioning Cognitive/Linguistic Baseline: Within functional limits Type of Home: House  Lives  With: Daughter Available Help at Discharge: Family;Available  24 hours/day Education: 10th grade Vocation: Retired  Programmer, systems Overall Cognitive Status: Impaired/Different from baseline Arousal/Alertness: Awake/alert Orientation Level: Oriented X4 Attention: Focused;Sustained Focused Attention: Appears intact Sustained Attention: Appears intact Memory: Impaired Memory Impairment: Retrieval deficit;Decreased recall of new information Awareness: Impaired Awareness Impairment: Emergent impairment;Intellectual impairment Problem Solving: Impaired Problem Solving Impairment: Verbal complex Executive Function: Reasoning;Sequencing;Organizing Reasoning: Impaired Sequencing: Impaired Sequencing Impairment: Verbal complex Organizing: Impaired Organizing Impairment: Verbal complex;Functional complex;Functional basic Safety/Judgment: Impaired  Comprehension Auditory Comprehension Overall Auditory Comprehension: Appears within functional limits for tasks assessed Yes/No Questions: Within Functional Limits Commands: Within Functional Limits Expression Expression Primary Mode of Expression: Verbal Verbal Expression Overall Verbal Expression: Appears within functional limits for tasks assessed Initiation: No impairment Repetition: No impairment Naming: No impairment Pragmatics: No impairment Written Expression Dominant Hand: Right Oral Motor Oral Motor/Sensory Function Overall Oral Motor/Sensory Function: Within functional limits Motor Speech Overall Motor Speech: Appears within functional limits for tasks assessed Respiration: Within functional limits Phonation: Normal Resonance: Hypernasality Articulation: Within functional limitis Intelligibility: Intelligible Motor Planning: Witnin functional limits Motor Speech Errors: Not applicable  Care Tool Care Tool Cognition Expression of Ideas and Wants Expression of Ideas and Wants: Some difficulty - exhibits some  difficulty with expressing needs and ideas (e.g, some words or finishing thoughts) or speech is not clear   Understanding Verbal and Non-Verbal Content Understanding Verbal and Non-Verbal Content: Usually understands - understands most conversations, but misses some part/intent of message. Requires cues at times to understand   Memory/Recall Ability *first 3 days only Memory/Recall Ability *first 3 days only: Current season;That he or she is in a hospital/hospital unit        Short Term Goals: Week 1: SLP Short Term Goal 1 (Week 1): Pt will utilize external memory aids to recall new, daily information with Min A verbal cues. SLP Short Term Goal 2 (Week 1): Pt will attend to left field of environement during functional tasks with mod A verbal cues. SLP Short Term Goal 3 (Week 1): Pt will demonstrate functional problem solving for familiar mildly complex tasks with mod A verbal cues. SLP Short Term Goal 4 (Week 1): Pt will demonstrate  intellectual awareness by identifying 2 cognitive and 2 physical impairments with mod A verbal and visual cues.  Refer to Care Plan for Long Term Goals  Recommendations for other services: None   Discharge Criteria: Patient will be discharged from SLP if patient refuses treatment 3 consecutive times without medical reason, if treatment goals not met, if there is a change in medical status, if patient makes no progress towards goals or if patient is discharged from hospital.  The above assessment, treatment plan, treatment alternatives and goals were discussed and mutually agreed upon: by patient  [place] 07/27/2020, 12:46 PM

## 2020-07-27 NOTE — Evaluation (Signed)
Physical Therapy Assessment and Plan  Patient Details  Name: Nicole [name] MRN: [id number] Date of Birth: [birth year]  PT Diagnosis: Abnormal posture, Difficulty walking, Hemiparesis non-dominant, Hypertonia, Impaired sensation, Muscle weakness and Pain in joint Rehab Potential: Good ELOS: 18-21 days   Today's Date: 07/27/2020 PT Individual Time: 0800-0920 PT Individual Time Calculation (min): 80 min    Hospital Problem: Principal Problem:   Cerebrovascular accident (CVA) of right basal ganglia ([place])   Past Medical History: History reviewed. No pertinent past medical history. Past Surgical History: History reviewed. No pertinent surgical history.  Assessment & Plan Clinical Impression:  Nicole [name] is an 86 year old right-handed female with history of asthma,glaucoma, hypertension as well as hypothyroidism hyperlipidemia.  Per chart review lives with daughter.  Two-level home bed and bath upstairs.  Independent with ADLs and still driving.  Presented 07/20/2020 left-sided weakness.  CT/MRI showed positive acute small vessel infarct in the posterior right corona radiata and lentiform.  No associated hemorrhage or mass-effect.  CT angiogram of head and neck no emergent large vessel occlusion or high-grade stenosis.  Echocardiogram with ejection fraction of 60 to 65% no wall motion abnormalities.  Admission chemistries potassium 3.3 glucose 139 WBC 10,800 urine drug screen negative SARS coronavirus negative.  Currently maintained on aspirin and Plavix for CVA prophylaxis as well as BMS investigational med.  Latest follow-up MRI 07/23/2020 showed slight increase intensity and mildly expanded diffusion restriction from the right posterior corona radiata throughout the right lentiform as compared to prior scan of 07/20/2020.  No new areas of ischemia.  Tolerating regular diet.  Therapy evaluations completed and patient was admitted for a comprehensive rehab program. Patient transferred to CIR on  07/26/2020 .   Patient currently requires total with mobility secondary to muscle weakness and muscle joint tightness, decreased cardiorespiratoy endurance, abnormal tone, unbalanced muscle activation and decreased coordination, decreased attention to left and decreased motor planning, decreased attention, decreased awareness, decreased problem solving, decreased safety awareness, decreased memory and delayed processing and decreased sitting balance, decreased standing balance, decreased postural control, hemiplegia and decreased balance strategies.  Prior to hospitalization, patient was independent  with mobility and lived with Daughter in a House home.  Home access is 1Stairs to enter.  Patient will benefit from skilled PT intervention to maximize safe functional mobility, minimize fall risk and decrease caregiver burden for planned discharge home with 24 hour assist.  Anticipate patient will benefit from follow up [facility] at discharge.  PT - End of Session Activity Tolerance: Tolerates 30+ min activity with multiple rests Endurance Deficit: Yes Endurance Deficit Description: frequent rest breaks during functional activity PT Assessment Rehab Potential (ACUTE/IP ONLY): Good PT Barriers to Discharge: Home environment access/layout PT Patient demonstrates impairments in the following area(s): Balance;Edema;Endurance;Motor;Pain;Perception;Safety;Sensory PT Transfers Functional Problem(s): Bed Mobility;Bed to Chair;Car;Furniture;Floor PT Locomotion Functional Problem(s): Ambulation;Wheelchair Mobility;Stairs PT Plan PT Intensity: Minimum of 1-2 x/day ,45 to 90 minutes PT Frequency: 5 out of 7 days PT Duration Estimated Length of Stay: 18-21 days PT Treatment/Interventions: Ambulation/gait training;Balance/vestibular training;Community reintegration;Discharge planning;Disease management/prevention;DME/adaptive equipment instruction;Functional electrical stimulation;Functional mobility  training;Neuromuscular re-education;Pain management;Patient/family education;Psychosocial support;Splinting/orthotics;Stair training;Therapeutic Activities;Therapeutic Exercise;UE/LE Strength taining/ROM;UE/LE Coordination activities;Visual/perceptual remediation/compensation;Wheelchair propulsion/positioning PT Transfers Anticipated Outcome(s): min A PT Locomotion Anticipated Outcome(s): min A with LRAD PT Recommendation Follow Up Recommendations: Home health PT;24 hour supervision/assistance Patient destination: Home Equipment Recommended: To be determined Equipment Details: TBD pending progress   PT Evaluation Precautions/Restrictions Precautions Precautions: Fall Precaution Comments: Left side weakness. Inattention to L Restrictions Weight Bearing Restrictions: No Pain Pain  Assessment Pain Scale: 0-10 Pain Score: 6  Pain Location: Leg Pain Orientation: Left Home Living/Prior Functioning Home Living Available Help at Discharge: Family;Available 24 hours/day Type of Home: House Home Access: Stairs to enter [employer] of Steps: 1 Entrance Stairs-Rails: None Home Layout: Two level;Able to live on main level with bedroom/bathroom;Bed/bath upstairs;Full bath on main level Alternate Level Stairs-Number of Steps: 14 Alternate Level Stairs-Rails: Left Additional Comments: [name] lives with her and works from home part time. She does about 2 wks in office/month. She has ability to work from home for ~6 weeks at a time.   Lives With: Daughter Prior Function Level of Independence: Independent with gait;Independent with transfers  Able to Take Stairs?: Yes Vision/Perception  Perception Perception: Impaired Inattention/Neglect: Other (comment) (decreased attention to L side) Praxis Praxis: Impaired Praxis Impairment Details: Motor planning  Cognition Overall Cognitive Status: Within Functional Limits for tasks assessed Arousal/Alertness:  Awake/alert Orientation Level: Oriented X4 Attention: Focused;Sustained Focused Attention: Appears intact Sustained Attention: Appears intact Memory: Impaired Memory Impairment: Retrieval deficit;Decreased recall of new information Awareness: Impaired Awareness Impairment: Emergent impairment Problem Solving: Impaired Safety/Judgment: Impaired Sensation Sensation Light Touch: Impaired Detail Light Touch Impaired Details: Impaired LUE;Impaired LLE Proprioception: Appears Intact Coordination Gross Motor Movements are Fluid and Coordinated: No Fine Motor Movements are Fluid and Coordinated: No Coordination and Movement Description: impaired 2/2 L hemi Motor  Motor Motor: Hemiplegia;Abnormal tone;Abnormal postural alignment and control Motor - Skilled Clinical Observations: L hemi  Trunk/Postural Assessment  Cervical Assessment Cervical Assessment: Exceptions to [facility] (forward head) Thoracic Assessment Thoracic Assessment: Exceptions to [facility] (kyphotic) Lumbar Assessment Lumbar Assessment: Exceptions to [facility] (posterior pelvic tilt) Postural Control Postural Control: Deficits on evaluation Trunk Control: impaired, L lateral lean Righting Reactions: delayed/insufficient Postural Limitations: impaired  Balance Balance Balance Assessed: Yes Static Sitting Balance Static Sitting - Balance Support: No upper extremity supported;Feet supported Static Sitting - Level of Assistance: 3: Mod assist;4: Min assist Dynamic Sitting Balance Dynamic Sitting - Balance Support: No upper extremity supported;Feet supported;During functional activity Dynamic Sitting - Level of Assistance: 3: Mod assist Sitting balance - Comments: L lateral lean Static Standing Balance Static Standing - Balance Support: Right upper extremity supported Static Standing - Level of Assistance: 4: Min assist (L lateral lean) Extremity Assessment    RLE Assessment RLE Assessment: Within Functional Limits General  Strength Comments: 5/5 grossly LLE Assessment LLE Assessment: Exceptions to [facility] Passive Range of Motion (PROM) Comments: impaired knee ROM due to flexor tone General Strength Comments: impaired, see below LLE Strength Left Hip Flexion: 1/5 Left Knee Flexion: 1/5 Left Knee Extension: 1/5 Left Ankle Dorsiflexion: 1/5  Care Tool Care Tool Bed Mobility Roll left and right activity   Roll left and right assist level: Moderate Assistance - Patient 50 - 74%    Sit to lying activity Sit to lying activity did not occur: Safety/medical concerns Sit to lying assist level: Moderate Assistance - Patient 50 - 74%    Lying to sitting edge of bed activity Lying to sitting edge of bed activity did not occur: Safety/medical concerns Lying to sitting edge of bed assist level: 2 Helpers     Care Tool Transfers Sit to stand transfer Sit to stand activity did not occur: Safety/medical concerns Sit to stand assist level: 2 Helpers    Chair/bed transfer Chair/bed transfer activity did not occur: Safety/medical concerns Chair/bed transfer assist level: 2 Armed forces training and education officer transfer activity did not occur: Safety/medical concerns  Scientist, product/process development transfer activity did not occur: Safety/medical concerns        Care Tool Locomotion Ambulation Ambulation activity did not occur: Safety/medical concerns        Walk 10 feet activity Walk 10 feet activity did not occur: Safety/medical concerns       Walk 50 feet with 2 turns activity Walk 50 feet with 2 turns activity did not occur: Safety/medical concerns      Walk 150 feet activity Walk 150 feet activity did not occur: Safety/medical concerns      Walk 10 feet on uneven surfaces activity Walk 10 feet on uneven surfaces activity did not occur: Safety/medical concerns      Stairs Stair activity did not occur: Safety/medical concerns        Walk up/down 1 step activity Walk up/down 1 step or curb (drop down) activity did  not occur: Safety/medical concerns     Walk up/down 4 steps activity did not occuR: Safety/medical concerns  Walk up/down 4 steps activity      Walk up/down 12 steps activity Walk up/down 12 steps activity did not occur: Safety/medical concerns      Pick up small objects from floor Pick up small object from the floor (from standing position) activity did not occur: Safety/medical concerns      Wheelchair Will patient use wheelchair at discharge?: Yes Type of Wheelchair: Manual Wheelchair activity did not occur: Safety/medical concerns      Wheel 50 feet with 2 turns activity Wheelchair 50 feet with 2 turns activity did not occur: Safety/medical concerns    Wheel 150 feet activity Wheelchair 150 feet activity did not occur: Safety/medical concerns      Refer to Care Plan for Long Term Goals  SHORT TERM GOAL WEEK 1 PT Short Term Goal 1 (Week 1): Pt will maintain static sitting balance x 5 min with min A PT Short Term Goal 2 (Week 1): Pt will perform least restrictive transfer with assist x 1 PT Short Term Goal 3 (Week 1): Pt will initiate w/c mobility PT Short Term Goal 4 (Week 1): Pt will initiate gait training as safe and able  Recommendations for other services: None   Skilled Therapeutic Intervention Evaluation completed (see details above and below) with education on PT POC and goals and individual treatment initiated with focus on functional transfer assessment, setting pt up with appropriate equipment to be used during rehab stay, orientation to rehab unit schedule and goals, and obtaining background information from patient and her daughter. Pt received seated in bed with breakfast tray in front of her, pt reports not feeling well this AM and has not eaten much but unable to be more specific regarding her symptoms. Pt declines to eat any more of her breakfast. Pt also reports pain in B knees (6/10) due to arthritis, use of stretching and heating pad during session for pain  management. Supine to sitting EOB with assist x 2 for BLE management and trunk control. Pt exhibits L lateral lean in sitting with min to mod A needed for sitting balance. Pt has onset of emesis while seated EOB. Pt reports feeling slightly better following emesis. Squat pivot transfer bed to w/c with mod A x 2. Once seated in w/c pt is setup A to rinse her mouth with mouthwash. Sit to stand with min A x 2 to stedy with heavy reliance on use of R UE/LE to achieve standing. Pt exhibits L lateral lean in standing as well. Provided pt with  L lap tray for improved positioning while seated in chair. Pt able to tolerate sitting up in chair x 15 min then requests to return to bed due to fatigue and not feeling well. Pt has another episode of emesis. Stedy transfer back to bed with assist x 2 for safety. Sit to supine mod A for BLE management. Pt left semi-reclined in bed with needs in reach, bed alarm in place. [name] present for 2nd half of session and able to assist with providing background information and is hands-on with assist back to bed. Nursing notified of pt's emesis and that daughter with questions regarding current medications.  Mobility Bed Mobility Bed Mobility: Rolling Right;Rolling Left;Supine to Sit;Sit to Supine Rolling Right: Moderate Assistance - Patient 50-74% Rolling Left: Moderate Assistance - Patient 50-74% Supine to Sit: 2 Helpers Sit to Supine: Moderate Assistance - Patient 50-74% Transfers Transfers: Sit to Stand;Squat Pivot Transfers Sit to Stand: 2 Helpers (min A x 2) Squat Pivot Transfers: 2 Helpers (mod A x 2) Transfer via Lift Equipment: Haematologist / Additional Locomotion Stairs: No Product manager Mobility: No   Discharge Criteria: Patient will be discharged from PT if patient refuses treatment 3 consecutive times without medical reason, if treatment goals not met, if there is a change in medical status, if patient makes no progress  towards goals or if patient is discharged from hospital.  The above assessment, treatment plan, treatment alternatives and goals were discussed and mutually agreed upon: by patient and by family   Excell Seltzer, PT, DPT 07/27/2020, 9:53 AM

## 2020-07-27 NOTE — Evaluation (Signed)
Occupational Therapy Assessment and Plan  Patient Details  Name: Nicole [name] MRN: [id number] Date of Birth: [birth year]  OT Diagnosis: cognitive deficits, hemiplegia affecting dominant side and muscle weakness (generalized) Rehab Potential: Rehab Potential (ACUTE ONLY): Fair ELOS: 3 weeks   Today's Date: 07/27/2020 OT Individual Time: 3419-3790 OT Individual Time Calculation (min): 45 min     Hospital Problem: Principal Problem:   Cerebrovascular accident (CVA) of right basal ganglia ([place])   Past Medical History: History reviewed. No pertinent past medical history. Past Surgical History: History reviewed. No pertinent surgical history.  Assessment & Plan Clinical Impression: Nicole [name] is an 86 year old right-handed female with history of asthma,glaucoma, hypertension as well as hypothyroidism hyperlipidemia.  Per chart review lives with daughter.  Two-level home bed and bath upstairs.  Independent with ADLs and still driving.  Presented 07/20/2020 left-sided weakness.  CT/MRI showed positive acute small vessel infarct in the posterior right corona radiata and lentiform.  No associated hemorrhage or mass-effect.  CT angiogram of head and neck no emergent large vessel occlusion or high-grade stenosis.  Echocardiogram with ejection fraction of 60 to 65% no wall motion abnormalities.  Admission chemistries potassium 3.3 glucose 139 WBC 10,800 urine drug screen negative SARS coronavirus negative.  Currently maintained on aspirin and Plavix for CVA prophylaxis as well as BMS investigational med.  Latest follow-up MRI 07/23/2020 showed slight increase intensity and mildly expanded diffusion restriction from the right posterior corona radiata throughout the right lentiform as compared to prior scan of 07/20/2020.  No new areas of ischemia.  Tolerating regular diet.  Therapy evaluations completed and patient was admitted for a comprehensive rehab program  Patient transferred to CIR on 07/26/2020 .     Patient currently requires max with basic self-care skills secondary to muscle weakness, decreased cardiorespiratoy endurance, unbalanced muscle activation, decreased coordination and decreased motor planning, decreased visual perceptual skills, decreased midline orientation, decreased attention to left and decreased motor planning, decreased initiation, decreased attention, decreased awareness, decreased problem solving, decreased safety awareness, decreased memory and delayed processing and decreased sitting balance, decreased standing balance, decreased postural control, hemiplegia and decreased balance strategies.  Prior to hospitalization, patient could complete ADLs with independent .  Patient will benefit from skilled intervention to decrease level of assist with basic self-care skills prior to discharge home with care partner.  Anticipate patient will require 24 hour supervision and follow up home health.  OT - End of Session Activity Tolerance: Tolerates < 10 min activity, no significant change in vital signs Endurance Deficit: Yes Endurance Deficit Description: frequent rest breaks during functional activity OT Assessment Rehab Potential (ACUTE ONLY): Fair OT Patient demonstrates impairments in the following area(s): Balance;Perception;Safety;Vision;Pain;Cognition;Edema;Skin Integrity;Endurance;Motor OT Basic ADL's Functional Problem(s): Eating;Grooming;Bathing;Dressing;Toileting OT Transfers Functional Problem(s): Toilet;Tub/Shower OT Additional Impairment(s): Fuctional Use of Upper Extremity OT Plan OT Intensity: Minimum of 1-2 x/day, 45 to 90 minutes OT Frequency: 5 out of 7 days OT Duration/Estimated Length of Stay: 3 weeks OT Treatment/Interventions: Balance/vestibular training;Discharge planning;Functional electrical stimulation;Pain management;Self Care/advanced ADL retraining;Therapeutic Activities;UE/LE Coordination activities;Visual/perceptual  remediation/compensation;Therapeutic Exercise;Skin care/wound managment;Patient/family education;Functional mobility training;Disease mangement/prevention;Cognitive remediation/compensation;DME/adaptive equipment instruction;Neuromuscular re-education;Community reintegration;Psychosocial support;Splinting/orthotics;UE/LE Strength taining/ROM;Wheelchair propulsion/positioning OT Self Feeding Anticipated Outcome(s): set up assist OT Basic Self-Care Anticipated Outcome(s): (S) OT Toileting Anticipated Outcome(s): min A OT Bathroom Transfers Anticipated Outcome(s): min A OT Recommendation Recommendations for Other Services: Speech consult Patient destination: Home Follow Up Recommendations: Home health OT Equipment Recommended: To be determined   OT Evaluation Precautions/Restrictions  Precautions Precautions: Fall Precaution Comments: Left side  weakness. Inattention to L Restrictions Weight Bearing Restrictions: No General Chart Reviewed: Yes OT Amount of Missed Time: 30 Minutes Family/Caregiver Present: No Vital Signs   Pain Pain Assessment Pain Scale: 0-10 Pain Score: 0-No pain Faces Pain Scale: No hurt Home Living/Prior Functioning Home Living Family/patient expects to be discharged to:: Private residence Living Arrangements: Children Available Help at Discharge: Family, Available 24 hours/day Type of Home: House Home Access: Stairs to enter Technical brewer of Steps: 1 Entrance Stairs-Rails: None Home Layout: Two level, Able to live on main level with bedroom/bathroom, Bed/bath upstairs, Full bath on main level Alternate Level Stairs-Number of Steps: 14 Alternate Level Stairs-Rails: Left Bathroom Shower/Tub: Chiropodist: Standard Bathroom Accessibility: Yes Additional Comments: [name] lives with her and works from home part time. She does about 2 wks in office/month. She has ability to work from home for ~6 weeks at a time.   Lives With:  Daughter IADL History Homemaking Responsibilities: Yes Meal Prep Responsibility: Secondary Laundry Responsibility: Secondary Cleaning Responsibility: Secondary Bill Paying/Finance Responsibility: Secondary Current License: Yes Mode of Transportation: Car Education: 10th grade Prior Function Level of Independence: Independent with gait, Independent with transfers, Independent with basic ADLs  Able to Take Stairs?: Yes Driving: Yes Vocation: Retired Comments: Pt sleeps in upstairs bedroom and daughter sleeps downstairs. Full bath on both levels. Pt stands in shower and is independent with ADL's. She is still driving, shopping, cooking, cleaning. Pt was not using AD for mobility  PTA. Vision Baseline Vision/History: Wears glasses Wears Glasses: Reading only Patient Visual Report: No change from baseline Vision Assessment?: Yes Eye Alignment: Within Functional Limits Ocular Range of Motion: Within Functional Limits Alignment/Gaze Preference: Within Defined Limits Tracking/Visual Pursuits: Requires cues, head turns, or add eye shifts to track;Decreased smoothness of vertical tracking;Decreased smoothness of horizontal tracking Saccades: Decreased speed of saccadic movement Convergence: Within functional limits Perception  Perception: Impaired Inattention/Neglect: Impaired-to be further tested in functional context (decreased L attention, able to correct with cueing) Praxis Praxis: Impaired Praxis Impairment Details: Initiation;Motor planning Cognition Overall Cognitive Status: Impaired/Different from baseline Arousal/Alertness: Awake/alert Orientation Level: Person;Place;Situation Person: Oriented Place: Oriented Situation: Oriented Year: 2021 Month: September Day of Week: Correct Memory: Impaired Memory Impairment: Retrieval deficit;Decreased recall of new information Immediate Memory Recall: Bed;Sock;Blue Memory Recall Sock: With Cue Memory Recall Blue: With Cue Memory  Recall Bed: Not able to recall Attention: Focused;Sustained Focused Attention: Appears intact Sustained Attention: Appears intact Awareness: Impaired Awareness Impairment: Anticipatory impairment (able to state she will need help with ADLs) Problem Solving: Impaired Problem Solving Impairment: Verbal complex Executive Function: Reasoning;Sequencing;Organizing Reasoning: Impaired Reasoning Impairment: Verbal complex Sequencing: Impaired Sequencing Impairment: Verbal complex Organizing: Impaired Organizing Impairment: Verbal complex Safety/Judgment: Impaired Sensation Sensation Light Touch: Appears Intact Proprioception: Impaired by gross assessment Coordination Gross Motor Movements are Fluid and Coordinated: No Fine Motor Movements are Fluid and Coordinated: No Coordination and Movement Description: impaired 2/2 L hemi Finger Nose Finger Test: WFL on the R, unable on the L Motor  Motor Motor: Hemiplegia;Abnormal tone;Abnormal postural alignment and control Motor - Skilled Clinical Observations: L hemi  Trunk/Postural Assessment  Cervical Assessment Cervical Assessment: Exceptions to [facility] (forward head) Thoracic Assessment Thoracic Assessment: Exceptions to [facility] (kyphotic) Lumbar Assessment Lumbar Assessment: Exceptions to [facility] (posterior pelvic tilt) Postural Control Postural Control: Deficits on evaluation Trunk Control: impaired, L lateral lean Righting Reactions: delayed/insufficient Postural Limitations: impaired  Balance Balance Balance Assessed: Yes Static Sitting Balance Static Sitting - Balance Support: No upper extremity supported;Feet supported Static Sitting -  Level of Assistance: 3: Mod assist Dynamic Sitting Balance Dynamic Sitting - Balance Support: No upper extremity supported;Feet supported;During functional activity Dynamic Sitting - Level of Assistance: 3: Mod assist Extremity/Trunk Assessment RUE Assessment RUE Assessment: Within Functional Limits  (WFL for age) LUE Assessment LUE Assessment: Exceptions to WFL LUE Body System: Neuro Brunstrum levels for arm and hand: Arm;Hand Brunstrum level for arm: Stage II Synergy is developing Brunstrum level for hand: Stage II Synergy is developing  Care Tool Care Tool Self Care Eating   Eating Assist Level: Set up assist    Oral Care    Oral Care Assist Level: Minimal Assistance - Patient > 75%    Bathing   Body parts bathed by patient: Left arm;Chest;Abdomen;Front perineal area;Buttocks;Face Body parts bathed by helper: Right arm;Right upper leg;Left upper leg;Right lower leg;Left lower leg   Assist Level: Maximal Assistance - Patient 24 - 49%    Upper Body Dressing(including orthotics)   What is the patient wearing?: Dress   Assist Level: Moderate Assistance - Patient 50 - 74%    Lower Body Dressing (excluding footwear)   What is the patient wearing?: Incontinence brief Assist for lower body dressing: Total Assistance - Patient < 25%    Putting on/Taking off footwear   What is the patient wearing?: Non-skid slipper socks;Ted hose Assist for footwear: Dependent - Patient 0%       Care Tool Toileting Toileting activity Toileting Activity did not occur (Clothing management and hygiene only): N/A (no void or bm)       Care Tool Bed Mobility Roll left and right activity   Roll left and right assist level: Moderate Assistance - Patient 50 - 74%    Sit to lying activity   Sit to lying assist level: Moderate Assistance - Patient 50 - 74%    Lying to sitting edge of bed activity   Lying to sitting edge of bed assist level: 2 Helpers     Care Tool Transfers Sit to stand transfer Sit to stand activity did not occur: Refused      Chair/bed transfer Chair/bed transfer activity did not occur: Refused       Toilet transfer Toilet transfer activity did not occur: Refused       Care Tool Cognition Expression of Ideas and Wants Expression of Ideas and Wants: Some difficulty  - exhibits some difficulty with expressing needs and ideas (e.g, some words or finishing thoughts) or speech is not clear   Understanding Verbal and Non-Verbal Content Understanding Verbal and Non-Verbal Content: Usually understands - understands most conversations, but misses some part/intent of message. Requires cues at times to understand   Memory/Recall Ability *first 3 days only Memory/Recall Ability *first 3 days only: Current season;That he or she is in a hospital/hospital unit    Refer to Care Plan for Long Term Goals  SHORT TERM GOAL WEEK 1 OT Short Term Goal 1 (Week 1): Pt will complete UB dressing with min A OT Short Term Goal 2 (Week 1): Pt will recall hemi dressing techniques with no more than min cueing OT Short Term Goal 3 (Week 1): Pt will increase functional activity tolerance by remaining OOB for 30 min OT Short Term Goal 4 (Week 1): Pt will maintain static sitting balance with min A during ADL task  Recommendations for other services: None    Skilled OT evaluation limited by pt fatigue and nausea. Edu provided re OT POC, ELOS, and CVA recovery. Pt willing to attempt to come EOB and   requiring mod-max A to transition from supine to sitting. Once in sitting pt had frequent LOB to the L and posteriorly. She c/o nausea and BP was obtained EOB- 149/78. Pt able to complete UB bathing and dressing EOB as described below. Pt abruptly returned to supine and was given ample rest break. LB bathing and dressing completed bed level. Visual assessment and sensory testing completed and recorded above. Pt with L inattention but is able to attend with mod cueing overall. Pt was visibly fatigued and politely declined any further participation. 30 min missed of skilled OT. Pt left supine with bed alarm set.   Skilled Therapeutic Intervention ADL ADL Eating: Set up Where Assessed-Eating: Bed level Grooming: Minimal assistance Where Assessed-Grooming: Bed level Upper Body Bathing: Moderate  cueing;Moderate assistance Where Assessed-Upper Body Bathing: Edge of bed Lower Body Bathing: Moderate assistance;Moderate cueing Where Assessed-Lower Body Bathing: Bed level Upper Body Dressing: Moderate cueing;Moderate assistance Where Assessed-Upper Body Dressing: Edge of bed Lower Body Dressing: Dependent Where Assessed-Lower Body Dressing: Bed level Toileting: Unable to assess Toilet Transfer: Unable to assess Toilet Transfer Method: Unable to assess Mobility  Bed Mobility Bed Mobility: Rolling Right;Rolling Left;Supine to Sit;Sit to Supine Rolling Right: Moderate Assistance - Patient 50-74% Rolling Left: Moderate Assistance - Patient 50-74% Supine to Sit: 2 Helpers Sit to Supine: Moderate Assistance - Patient 50-74% Transfers Sit to Stand: 2 Helpers (min A x2)   Discharge Criteria: Patient will be discharged from OT if patient refuses treatment 3 consecutive times without medical reason, if treatment goals not met, if there is a change in medical status, if patient makes no progress towards goals or if patient is discharged from hospital.  The above assessment, treatment plan, treatment alternatives and goals were discussed and mutually agreed upon: by patient  Curtis Sites 07/27/2020, 2:04 PM

## 2020-07-27 NOTE — Progress Notes (Signed)
Horton Chin, MD  Physician  Physical Medicine and Rehabilitation  Consult Note      Signed  Date of Service:  07/25/2020  5:29 AM      Related encounter: ED to Hosp-Admission (Discharged) from 07/20/2020 in [facility]      Signed      Expand All Collapse All  Show:Clear all Manual[x] Template[] Copied  Added by: Angiulli, Mcarthur Rossetti, PA-C[x] Raulkar, Drema Pry, MD  Hover for details          Physical Medicine and Rehabilitation Consult Reason for Consult: Left side weakness Referring Physician: [facility]     HPI: Nicole [name] is a 86 y.o. right-handed female with history of hypertension as well as hypothyroidism and hyperlipidemia.  Per chart review patient lives with daughter.  Two-level home bed and bath upstairs.  Independent with ADLs and still driving.  Presented 07/20/2020 with left-sided weakness.  CT/MRI showed positive acute small vessel infarct in the posterior right corona radiata and lentiform.  No associated hemorrhage or mass-effect.  CT angiogram of head and neck no emergent large vessel occlusion or high-grade stenosis.  Echocardiogram with ejection fraction of 60 to 65% no wall motion abnormalities.  Admission chemistries potassium 3.3, glucose 139, WBC 10,800, urine drug screen negative, SARS coronavirus negative.  Currently maintained on aspirin/Plavix for CVA prophylaxis as well as BMS investigational med.  Latest follow-up MRI 07/23/2020 showed slight increase intensity and mildly expanded diffusion restriction from the right posterior corona radiata throughout the right lentiform as compared to prior scan of 07/20/2020.  No new areas of ischemia.  Tolerating a regular consistency diet.  Therapy evaluations completed with recommendations of physical medicine rehab consult.     Review of Systems  Constitutional: Negative for chills and fever.  HENT: Negative for hearing loss.   Eyes: Negative for blurred vision and double vision.   Respiratory: Negative for cough and shortness of breath.   Cardiovascular: Negative for chest pain, palpitations and leg swelling.  Gastrointestinal: Positive for constipation. Negative for heartburn, nausea and vomiting.  Genitourinary: Negative for dysuria, flank pain and hematuria.  Musculoskeletal: Positive for joint pain and myalgias.  Skin: Negative for rash.  Neurological: Positive for weakness.  All other systems reviewed and are negative.   History reviewed. No pertinent past medical history. History reviewed. No pertinent surgical history.      Family History  Problem Relation Age of Onset  . Diabetes Mother    . Diabetes Other    . Hypertension Other      Social History:  reports that she has never smoked. She has never used smokeless tobacco. She reports that she does not drink alcohol and does not use drugs. Allergies: No Known Allergies       Medications Prior to Admission  Medication Sig Dispense Refill  . amLODipine (NORVASC) 10 MG tablet Take 10 mg by mouth daily.   11  . aspirin 81 MG EC tablet Take 81 mg by mouth daily.       . cetirizine (ZYRTEC) 10 MG tablet Take 10 mg by mouth daily as needed for allergies or rhinitis.      . Cholecalciferol (VITAMIN D-3 PO) Take 1 capsule by mouth daily.      . Cyanocobalamin (VITAMIN B-12 PO) Take 1 tablet by mouth daily.      . diclofenac Sodium (VOLTAREN) 1 % GEL Apply 2 g topically 4 (four) times daily as needed (to affected areas- for pain).       Marland Kitchen  hydrochlorothiazide (HYDRODIURIL) 25 MG tablet Take 25 mg by mouth daily.   2  . levothyroxine (SYNTHROID) 50 MCG tablet Take 50 mcg by mouth daily before breakfast.       . Multiple Vitamins-Minerals (CENTRUM SILVER ULTRA WOMENS) TABS Take 1 tablet by mouth daily with breakfast.      . oxybutynin (DITROPAN-XL) 5 MG 24 hr tablet Take 5 mg by mouth at bedtime.       . pravastatin (PRAVACHOL) 40 MG tablet Take 40 mg by mouth daily.    2  . PROAIR HFA 108 (90 BASE) MCG/ACT  inhaler Inhale 1 puff into the lungs every 4 (four) hours as needed for wheezing or shortness of breath.    0  . cefdinir (OMNICEF) 300 MG capsule Take 300 mg by mouth 2 (two) times daily. (Patient not taking: Reported on 07/20/2020)   0  . celecoxib (CELEBREX) 200 MG capsule  (Patient not taking: Reported on 07/20/2020)      . celecoxib (CELEBREX) 200 MG capsule Take 200 mg by mouth daily. (Patient not taking: Reported on 07/20/2020)      . Diclofenac Sodium CR 100 MG 24 hr tablet  (Patient not taking: Reported on 07/20/2020)   1  . ibandronate (BONIVA) 150 MG tablet Take by mouth. (Patient not taking: Reported on 07/20/2020)      . levothyroxine (SYNTHROID, LEVOTHROID) 25 MCG tablet Take 25 mcg by mouth daily. (Patient not taking: Reported on 07/20/2020)   3      Home: Home Living Family/patient expects to be discharged to:: Private residence Living Arrangements: Children (Daughter) Available Help at Discharge: Family, Available 24 hours/day Type of Home: House Home Access: Stairs to enter [employer] of Steps: 1 Entrance Stairs-Rails: None Home Layout: Two level, Able to live on main level with bedroom/bathroom, Bed/bath upstairs, Full bath on main level Alternate Level Stairs-Number of Steps: 14 Alternate Level Stairs-Rails: Left Bathroom Shower/Tub: Engineer, manufacturing systems: Standard Bathroom Accessibility: Yes Home Equipment: None Additional Comments: [name] lives with her and works from home part time. She does about 2 wks in office/month. She has ability to work from home for ~6 weeks at a time.   Lives With: Daughter  Functional History: Prior Function Level of Independence: Independent Comments: Pt sleeps in upstairs bedroom and daughter sleeps downstairs. Full bath on both levels. Pt stands in shower and is independent with ADL's. She is still driving, shopping, cooking, cleaning. Pt was not using AD for mobility  PTA. Functional Status:  Mobility: Bed  Mobility Overal bed mobility: Needs Assistance Bed Mobility: Supine to Sit Supine to sit: Mod assist, HOB elevated Sit to supine: Min assist General bed mobility comments: Mod A to elevate turnk and manage LLE Transfers Overall transfer level: Needs assistance Equipment used: Rolling walker (2 wheeled) Transfers: Sit to/from Stand, [employer] Transfers Sit to Stand: Mod assist Stand pivot transfers: Max assist General transfer comment: Mod A to power up into standing with L knee blocked. Performing stand pivot to RW, blocking left knee and Max A to maintain balance and keep LUE on RW Ambulation/Gait General Gait Details: Attempted lateral sidestepping at EOB, pt unable to sequence stepping safely attempting to cross Lt over Rt. Lt LE buckling with Rt stepping. Returned to sit EOB.    ADL: ADL Overall ADL's : Needs assistance/impaired Eating/Feeding: Minimal assistance, Sitting Grooming: Minimal assistance, Sitting Upper Body Bathing: Minimal assistance, Sitting Lower Body Bathing: Maximal assistance, Sit to/from stand Upper Body Dressing : Moderate assistance,  Sitting Lower Body Dressing: Maximal assistance, Sit to/from stand Lower Body Dressing Details (indicate cue type and reason): Pt highly motivated. Pt requiring Max A for donning socks. Providing hand over hand on L hand to maintain grasp strength. Pt requiring assistance to manage LLE.  Toilet Transfer: Maximal assistance, Stand-pivot (simulated to recliner) Statistician Details (indicate cue type and reason): Max A to block L knee  Toileting- Clothing Manipulation and Hygiene: Minimal assistance, Maximal assistance, Sitting/lateral lean Toileting - Clothing Manipulation Details (indicate cue type and reason): Min A for peri care with lateral lean. sit<>stand and daughter assisting to manage underwear Functional mobility during ADLs: Maximal assistance, Rolling walker (stand pivot) General ADL Comments: Pt very motivated.  Presenting with    Cognition: Cognition Overall Cognitive Status: Impaired/Different from baseline Arousal/Alertness: Awake/alert Orientation Level: Oriented X4 Attention: Focused, Sustained Focused Attention: Appears intact Sustained Attention: Appears intact Memory: Impaired Memory Impairment: Retrieval deficit, Decreased recall of new information (Immediate: 3/5; delayed: 0/5 ) Awareness: Impaired Awareness Impairment: Emergent impairment Problem Solving: Impaired Problem Solving Impairment: Verbal complex Executive Function: Reasoning, Sequencing, Organizing Sequencing: Impaired Sequencing Impairment: Verbal complex (Clock drawing: 0/4) Organizing: Impaired Organizing Impairment: Verbal complex (Backward digit span: 0/3) Cognition Arousal/Alertness: Awake/alert Behavior During Therapy: WFL for tasks assessed/performed Overall Cognitive Status: Impaired/Different from baseline Area of Impairment: Problem solving, Awareness, Following commands Following Commands: Follows one step commands with increased time, Follows multi-step commands inconsistently, Follows multi-step commands with increased time Awareness: Emergent Problem Solving: Slow processing, Requires verbal cues, Requires tactile cues General Comments: Pt requiring increased time for processing. Pt very motivated to particiapte in ADLs and therapy. Pt also present with decreased awareness of L side.    Blood pressure 130/70, pulse 80, temperature 98.2 F (36.8 C), temperature source Oral, resp. rate 18, height 5\' 5"  (1.651 m), weight 69.1 kg, SpO2 94 %. Physical Exam General: Alert and oriented x 2 (not time), No apparent distress HEENT: Head is normocephalic, atraumatic, PERRLA, EOMI, sclera anicteric, oral mucosa pink and moist, dentition intact, ext ear canals clear,  Neck: Supple without JVD or lymphadenopathy Heart: Reg rate and rhythm. No murmurs rubs or gallops Chest: CTA bilaterally without wheezes, rales,  or rhonchi; no distress Abdomen: Soft, non-tender, non-distended, bowel sounds positive. Extremities: No clubbing, cyanosis, or edema. Pulses are 2+ Skin: Clean and intact without signs of breakdown Neuro: Patient is alert in no acute distress.  Makes eye contact with examiner follows commands.  Provides her name and age.  She does display some decrease in awareness of her deficits. Left sided facial droop.  LUE 2/5, LLE 2/5 HF and KE and 0/5 DF/PF Sensation decreased on the left side.  Psych: Pt's affect is appropriate. Pt is cooperative   Lab Results Last 24 Hours       Results for orders placed or performed during the hospital encounter of 07/20/20 (from the past 24 hour(s))  CBC with Differential/Platelet     Status: None    Collection Time: 07/24/20  6:17 AM  Result Value Ref Range    WBC 8.0 4.0 - 10.5 K/uL    RBC 4.14 3.87 - 5.11 MIL/uL    Hemoglobin 12.6 12.0 - 15.0 g/dL    HCT 07/26/20 36 - 46 %    MCV 95.7 80.0 - 100.0 fL    MCH 30.4 26.0 - 34.0 pg    MCHC 31.8 30.0 - 36.0 g/dL    RDW 88.5 02.7 - 74.1 %    Platelets 253 150 - 400 K/uL  nRBC 0.0 0.0 - 0.2 %    Neutrophils Relative % 61 %    Neutro Abs 4.8 1.7 - 7.7 K/uL    Lymphocytes Relative 30 %    Lymphs Abs 2.4 0.7 - 4.0 K/uL    Monocytes Relative 6 %    Monocytes Absolute 0.5 0 - 1 K/uL    Eosinophils Relative 2 %    Eosinophils Absolute 0.2 0 - 0 K/uL    Basophils Relative 1 %    Basophils Absolute 0.1 0 - 0 K/uL    Immature Granulocytes 0 %    Abs Immature Granulocytes 0.02 0.00 - 0.07 K/uL       Imaging Results (Last 48 hours)  CT HEAD WO CONTRAST   Result Date: 07/23/2020 CLINICAL DATA:  Stroke follow-up EXAM: CT HEAD WITHOUT CONTRAST TECHNIQUE: Contiguous axial images were obtained from the base of the skull through the vertex without intravenous contrast. COMPARISON:  MRI head 07/22/2020, 07/20/2020 FINDINGS: Brain: Hypodensity right posterior lentiform nucleus again noted compatible with recent infarct.  No change in size. No new area of infarct or hemorrhage. Mild atrophy. Chronic microvascular ischemic changes throughout the white matter. Chronic infarct right internal capsule anteriorly. Negative for mass lesion. Vascular: Negative for hyperdense vessel Skull: Negative Sinuses/Orbits: Paranasal sinuses clear. Bilateral cataract extraction Other: None IMPRESSION: Acute infarct in the right lentiform nucleus unchanged from recent MRI. No acute hemorrhage Atrophy and chronic microvascular ischemic change in the white matter. Electronically Signed   By: Marlan Palauharles  Clark M.D.   On: 07/23/2020 13:07    MR BRAIN WO CONTRAST   Result Date: 07/23/2020 CLINICAL DATA:  86 year old female with code stroke presentation, left side weakness, small vessel ischemia in the posterior right corona radiata and lentiform diagnosed on MRI 07/20/2020. EXAM: LIMITED MRI HEAD WITHOUT CONTRAST TECHNIQUE: Diffusion-weighted imaging only requested by neurology. COMPARISON:  Brain MRI yesterday, 07/20/2020. FINDINGS: Axial and coronal DWI. Restricted diffusion has increased in intensity since 07/20/2020 in the affected area from the posterior right corona radiata through the right lentiform. Compare series 7, image 53 today to series 7, image 53 on 07/20/2020. The area of involvement has mildly expanded. But there is no new restricted diffusion elsewhere. No intracranial mass effect. IMPRESSION: Increased intensity and mildly expanded diffusion restriction from the right posterior corona radiata through the right lentiform since 07/20/2020. No new areas of ischemia.  No intracranial mass effect. Electronically Signed   By: Odessa FlemingH  Hall M.D.   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 Does the need for close, 24 hr/day medical supervision in concert with the patient's rehab needs make it unreasonable for this patient to be served in a less intensive setting?  Yes 2. Co-Morbidities requiring supervision/potential complications: HLD, HTN, impaired orientation, hypothyroidism, hyperglycemia 3. Due to bladder management, bowel management, safety, skin/wound care, disease management, medication administration, pain management and patient education, does the patient require 24 hr/day rehab nursing? Yes 4. Does the patient require coordinated care of a physician, rehab nurse, therapy disciplines of PT, OT, SLP to address physical and functional deficits in the context of the above medical diagnosis(es)? Yes Addressing deficits in the following areas: balance, endurance, locomotion, strength, transferring, bowel/bladder control, bathing, dressing, feeding, grooming, toileting, cognition, speech and psychosocial support 5. Can the patient actively participate in an intensive therapy program of at least 3 hrs of therapy per day at least 5 days per week? Yes 6. The  potential for patient to make measurable gains while on inpatient rehab is excellent 7. Anticipated functional outcomes upon discharge from inpatient rehab are min assist  with PT, min assist with OT, supervision with SLP. 8. Estimated rehab length of stay to reach the above functional goals is: 14-18 days 9. Anticipated discharge destination: Home 10. Overall Rehab/Functional Prognosis: excellent   RECOMMENDATIONS: This patient's condition is appropriate for continued rehabilitative care in the following setting: CIR Patient has agreed to participate in recommended program. Yes Note that insurance prior authorization may be required for reimbursement for recommended care.   Comment: Patient would be excellent CIR candidate if we can confirm 24/7 family supervision. Thank you for this consult. Admission coordinator to follow.    I have personally performed a face to face diagnostic evaluation, including, but not limited to relevant history and physical exam findings, of this patient and developed  relevant assessment and plan.  Additionally, I have reviewed and concur with the physician assistant's documentation above.   Sula Soda, MD   Charlton Amor, PA-C 07/25/2020        Revision History                     Routing History                Note Details  Author Horton Chin, MD File Time 07/25/2020  2:16 PM  Author Type Physician Status Signed  Last Editor Horton Chin, MD Service Physical Medicine and Rehabilitation  Hospital Acct # [id number] Admit Date 07/26/2020

## 2020-07-27 NOTE — Progress Notes (Signed)
Standley Brooking, RN  Rehab Admission Coordinator  Physical Medicine and Rehabilitation  PMR Pre-admission      Signed  Date of Service:  07/25/2020  4:16 PM      Related encounter: ED to Hosp-Admission (Discharged) from 07/20/2020 in [facility]      Signed       Show:Clear all [x] Manual[x] Template[x] Copied  Added by: [x] , RN  [] Hover for details PMR Admission Coordinator Pre-Admission Assessment   Patient: Nicole [name] is an 86 y.o., female MRN: DOB: 1930-05-18 Height: 5\' 5"  (165.1 cm) Weight: 69.1 kg                                                                                                                                                  Insurance Information HMO: yes    PPO:      PCP:      IPA:      80/20:      OTHER:  PRIMARY: Blue Medicare      Policy#: 98      Subscriber: pt CM Name: [id number]      Phone#: [phone]     Fax#: Pre-Cert#: TBD approved for 7 days     Employer:  Benefits:  Phone #: [phone]     Name: 9/21 Eff. Date: 11/06/2019     Deduct: none      Out of Pocket Max: $4200      Life Max: none  CIR: $335 co pay per admission      SNF: no copays; limited by medcial neccesity Outpatient: $40 Co pay per visit     Co-Pay: visits per medcial neccesity Home Health: 100%      Co-Pay: visits limited by medcial neccesity DME: 80%     Co-Pay: 20% Providers: in network  SECONDARY: none      Policy#:       Phone#:    [phone]:       Phone#:    The [phone]" for patients in Inpatient Rehabilitation Facilities with attached "Privacy Act Statement-Health Care Records" was provided and verbally reviewed with: Patient and Family   Emergency Contact Information         Contact Information     Name Relation Home Work Mobile    Griffie,[name] Daughter [phone]           Current Medical History  Patient Admitting Diagnosis: CVA   History of  Present Illness:  Nicole [name] is a 86 y.o. right-handed female with history of hypertension as well as hypothyroidism and hyperlipidemia.    Independent with ADLs and still driving.  Presented 07/20/2020 with left-sided weakness.  CT/MRI showed positive acute small vessel infarct in the posterior right corona radiata and lentiform.  No associated hemorrhage or mass-effect.  CT angiogram of head and  neck no emergent large vessel occlusion or high-grade stenosis.  Echocardiogram with ejection fraction of 60 to 65% no wall motion abnormalities.  Admission chemistries potassium 3.3, glucose 139, WBC 10,800, urine drug screen negative, SARS coronavirus negative.  Currently maintained on aspirin/Plavix for CVA prophylaxis as well as BMS investigational med.  Latest follow-up MRI 07/23/2020 showed slight increase intensity and mildly expanded diffusion restriction from the right posterior corona radiata throughout the right lentiform as compared to prior scan of 07/20/2020.  No new areas of ischemia.  Tolerating a regular consistency diet.    Complete NIHSS TOTAL: 11 Glasgow Coma Scale Score: 15   Past Medical History  History reviewed. No pertinent past medical history.   Family History  family history includes Diabetes in her mother and another family member; Hypertension in an other family member.   Prior Rehab/Hospitalizations:  Has the patient had prior rehab or hospitalizations prior to admission? Yes   Has the patient had major surgery during 100 days prior to admission? No   Current Medications    Current Facility-Administered Medications:  .  acetaminophen (TYLENOL) tablet 650 mg, 650 mg, Oral, Q4H PRN, 650 mg at 07/26/20 0827 **OR** acetaminophen (TYLENOL) 160 MG/5ML solution 650 mg, 650 mg, Per Tube, Q4H PRN **OR** acetaminophen (TYLENOL) suppository 650 mg, 650 mg, Rectal, Q4H PRN, Nicole, Anastassia, MD .  albuterol (VENTOLIN HFA) 108 (90 Base) MCG/ACT inhaler 1 puff, 1 puff, Inhalation,  Q4H PRN, Nicole, Anastassia, MD .  atorvastatin (LIPITOR) tablet 80 mg, 80 mg, Oral, Daily, Nicole [name] D, MD, 80 mg at 07/26/20 1000 .  levothyroxine (SYNTHROID) tablet 50 mcg, 50 mcg, Oral, QAC breakfast, Nicole, Anastassia, MD, 50 mcg at 07/26/20 1610 .  oxybutynin (DITROPAN-XL) 24 hr tablet 5 mg, 5 mg, Oral, QHS, Nicole, Anastassia, MD, 5 mg at 07/25/20 2223 .  polyethylene glycol (MIRALAX / GLYCOLAX) packet 17 g, 17 g, Oral, BID, Nicole [name] D, MD, 17 g at 07/25/20 2223 .  senna-docusate (Senokot-S) tablet 2 tablet, 2 tablet, Oral, BID, Nicole [name] D, MD, 2 tablet at 07/25/20 2223 .  STUDY - AXIOMATIC - aspirin  (PI - Sethi), 100 mg, Oral, QAC breakfast, Nicole Riley, MD, 100 mg at 07/25/20 0814 .  STUDY - AXIOMATIC - [id number] or placebo (PI - Sethi), 2 capsule, Oral, BID, Nicole Riley, MD, 2 capsule at 07/25/20 2223 .  STUDY - AXIOMATIC - clopidogrel 75 mg (PI - Sethi), 75 mg, Oral, Q24H, Nicole [name] D, MD, 75 mg at 07/25/20 0815 .  traMADol (ULTRAM) tablet 50 mg, 50 mg, Oral, Q6H PRN, Nicole [name] D, MD, 50 mg at 07/26/20 1000   Patients Current Diet:     Diet Order                      Diet Heart Room service appropriate? Yes; Fluid consistency: Thin  Diet effective now                      Precautions / Restrictions Precautions Precautions: Fall Precaution Comments: Left side weakness. Inattention to L Restrictions Weight Bearing Restrictions: No    Has the patient had 2 or more falls or a fall with injury in the past year?No   Prior Activity Level Community (5-7x/wk): Independent and driving   Prior Functional Level Prior Function Level of Independence: Independent Comments: Pt sleeps in upstairs bedroom and daughter sleeps downstairs. Full bath on both levels. Pt stands in shower and is independent with  ADL's. She is still driving, shopping, cooking, cleaning. Pt was not using AD for mobility  PTA.   Self Care: Did the patient need help  bathing, dressing, using the toilet or eating?  Independent   Indoor Mobility: Did the patient need assistance with walking from room to room (with or without device)? Independent   Stairs: Did the patient need assistance with internal or external stairs (with or without device)? Independent   Functional Cognition: Did the patient need help planning regular tasks such as shopping or remembering to take medications? Independent   Home Assistive Devices / Equipment Home Assistive Devices/Equipment: None Home Equipment: None   Prior Device Use: Indicate devices/aids used by the patient prior to current illness, exacerbation or injury? None of the above   Current Functional Level Cognition   Arousal/Alertness: Awake/alert Overall Cognitive Status: No family/caregiver present to determine baseline cognitive functioning Orientation Level: Oriented X4 Following Commands: Follows one step commands with increased time, Follows multi-step commands inconsistently, Follows multi-step commands with increased time General Comments: Pt requiring increased time for processing. Pt very motivated to particiapte in therapy session. Pt also present with decreased awareness of L side.  Attention: Focused, Sustained Focused Attention: Appears intact Sustained Attention: Appears intact Memory: Impaired Memory Impairment: Retrieval deficit, Decreased recall of new information (Immediate: 3/5; delayed: 0/5 ) Awareness: Impaired Awareness Impairment: Emergent impairment Problem Solving: Impaired Problem Solving Impairment: Verbal complex Executive Function: Reasoning, Sequencing, Organizing Sequencing: Impaired Sequencing Impairment: Verbal complex (Clock drawing: 0/4) Organizing: Impaired Organizing Impairment: Verbal complex (Backward digit span: 0/3)    Extremity Assessment (includes Sensation/Coordination)   Upper Extremity Assessment: LUE deficits/detail LUE Deficits / Details: Poor grasp  strength, pinch strength, and FM/GM coorindation. Pt able to bring hand to face. Poor strength over 80*.  LUE Coordination: decreased fine motor, decreased gross motor  Lower Extremity Assessment: Defer to PT evaluation RLE Deficits / Details: pt with 3+/5 for hip flexion, 4/5 for knee flex/ext, and ankle dorsi/plantarflexion.  RLE Sensation: WNL RLE Coordination: WNL LLE Deficits / Details: pt with 3-/5 for hip flexion, 4/5 for knee flex/ext, and ankle dorsi/plantarflexion.  LLE Sensation: WNL LLE Coordination: decreased gross motor (poor ability to sequence stepping in standing.)     ADLs   Overall ADL's : Needs assistance/impaired Eating/Feeding: Minimal assistance, Sitting Grooming: Minimal assistance, Sitting Upper Body Bathing: Minimal assistance, Sitting Lower Body Bathing: Maximal assistance, Sit to/from stand Upper Body Dressing : Moderate assistance, Sitting Lower Body Dressing: Maximal assistance, Sit to/from stand Lower Body Dressing Details (indicate cue type and reason): Pt highly motivated. Pt requiring Max A for donning socks. Providing hand over hand on L hand to maintain grasp strength. Pt requiring assistance to manage LLE.  Toilet Transfer: Maximal assistance, Stand-pivot (simulated to recliner) Statistician Details (indicate cue type and reason): Max A to block L knee  Toileting- Clothing Manipulation and Hygiene: Minimal assistance, Maximal assistance, Sitting/lateral lean Toileting - Clothing Manipulation Details (indicate cue type and reason): Min A for peri care with lateral lean. sit<>stand and daughter assisting to manage underwear Functional mobility during ADLs: Maximal assistance, Rolling walker (stand pivot) General ADL Comments: Pt very motivated. Presenting with      Mobility   Overal bed mobility: Needs Assistance Bed Mobility: Rolling, Sidelying to Sit, Sit to Sidelying Rolling: Mod assist (to right) Sidelying to sit: Mod assist, HOB elevated (after  legs over EOB, elevated HOB to 25 and pt pushed up) Supine to sit: Mod assist, HOB elevated, +2 for safety/equipment Sit to  supine: Min assist Sit to sidelying: Mod assist General bed mobility comments: rolling requires assist to bring LUE across, bend LLE, pt uses RUE on rail     Transfers   Overall transfer level: Needs assistance Equipment used: Rolling walker (2 wheeled) Transfer via Lift Equipment: Stedy Transfers: Lateral/Scoot Transfers Sit to Stand: Mod assist, +2 physical assistance, +2 safety/equipment Stand pivot transfers: Max assist  Lateral/Scoot Transfers: Max assist General transfer comment: scooting to her right along EOB x 4      Ambulation / Gait / Stairs / Wheelchair Mobility   Ambulation/Gait General Gait Details: Did not attempt - pre-gait only of weight shifting in [place] before pt fatigued.      Posture / Balance Dynamic Sitting Balance Sitting balance - Comments: initial posterior and left lean up to mod assist; progressed to minguard in midline up to 20 sec Balance Overall balance assessment: Needs assistance Sitting-balance support: Feet supported, Single extremity supported Sitting balance-Leahy Scale: Poor Sitting balance - Comments: initial posterior and left lean up to mod assist; progressed to minguard in midline up to 20 sec Postural control: Posterior lean, Left lateral lean Standing balance support: During functional activity, Bilateral upper extremity supported Standing balance-Leahy Scale: Zero Standing balance comment: Required max assist +1 for standing balance     Special needs/care consideration Designated visitor is daughter, [name]    Previous Youth workerHome Environment  Living Arrangements:  (lives with daughter)  Lives With: Daughter Available Help at Discharge:  (daughter can work remote; HR for [employer] services/ They work ) Type of Home: [facility] Home Layout: Two level, Able to live on main level with bedroom/bathroom, Bed/bath upstairs, Full  bath on main level Alternate Level Stairs-Rails: Left Alternate Level Stairs-Number of Steps: 14 Home Access: Stairs to enter Entrance Stairs-Rails: None Entrance Stairs-Number of Steps: 1 Bathroom Shower/Tub: Associate ProfessorTub/shower unit Bathroom Toilet: Standard Bathroom Accessibility: Yes Home Care Services: No Additional Comments: [name] lives with her and works from home part time. She does about 2 wks in office/month. She has ability to work from home for ~6 weeks at a time.    Discharge Living Setting Plans for Discharge Living Setting: Patient's home, Lives with (comment) (daughter) Type of Home at Discharge: House Discharge Home Layout: Able to live on main level with bedroom/bathroom, Two level Alternate Level Stairs-Rails: Left Alternate Level Stairs-Number of Steps: 14 Discharge Home Access: Stairs to enter Entrance Stairs-Rails: None Entrance Stairs-Number of Steps: 1 Discharge Bathroom Shower/Tub: Tub/shower unit Discharge Bathroom Toilet: Standard Discharge Bathroom Accessibility: Yes How Accessible: Accessible via walker Does the patient have any problems obtaining your medications?: No   Social/Family/Support Systems Contact Information: daughter, [name] Anticipated Caregiver: daughter and hired caregivers Anticipated Industrial/product designerCaregiver's Contact Information: [phone] Ability/Limitations of Caregiver: daughter works hybrid 2 week at [place] time in [place] [employer]. Can make arrangements to work from home fulltime Caregiver Availability: 24/7 Discharge Plan Discussed with Primary Caregiver: Yes Is Caregiver In Agreement with Plan?: No Does Caregiver/Family have Issues with Lodging/Transportation while Pt is in Rehab?: No   Goals Patient/Family Goal for Rehab: min wiht PT and OT, supervision with SLP Expected length of stay: ELOS 14 to 18 days Pt/Family Agrees to Admission and willing to participate: Yes Program Orientation Provided & Reviewed with Pt/Caregiver  Including Roles  & Responsibilities: Yes   Decrease burden of Care through IP rehab admission: n/a   Possible need for SNF placement upon discharge:not anticipated   Patient Condition: This patient's condition remains as documented in the consult dated  07/25/2020, in which the Rehabilitation Physician determined and documented that the patient's condition is appropriate for intensive rehabilitative care in an inpatient rehabilitation facility. Will admit to inpatient rehab today.   Preadmission Screen Completed By:  Clois Dupes, RN, 07/26/2020 2:08 PM ______________________________________________________________________   Discussed status with Dr. Berline Chough on 07/26/2020 at  1408 and received approval for admission today.   Admission Coordinator:  Clois Dupes, time 6415 Date 07/26/2020             Cosigned by: Genice Rouge, MD at 07/26/2020  2:30 PM  Revision History                          Note Details  Author Standley Brooking, RN File Time 07/26/2020  2:08 PM  Author Type Rehab Admission Coordinator Status Signed  Last Editor Standley Brooking, RN Service Physical Medicine and [facility] Acct # [id number] Admit Date 07/26/2020

## 2020-07-27 NOTE — Progress Notes (Signed)
[place] PHYSICAL MEDICINE & REHABILITATION PROGRESS NOTE   Subjective/Complaints:  Pt c/o L knee hurting- got Tramadol last night- and yesterday evening- but has worn off this Am, so perseverating on the pain this AM. Hasn't received meds yet, but due and RN knows.   Also, ate <25% of breakfast - "wasn't hungry".      ROS:  Pt denies SOB, abd pain, CP, N/V/C/D, and vision changes  Objective:   No results found. Recent Labs    07/27/20 0646  WBC 9.6  HGB 12.1  HCT 38.3  PLT 268   Recent Labs    07/27/20 0646  NA 137  K 4.1  CL 104  CO2 25  GLUCOSE 106*  BUN 15  CREATININE 0.75  CALCIUM 9.2   No intake or output data in the 24 hours ending 07/27/20 1128      Physical Exam: Vital Signs Blood pressure 131/70, pulse 73, temperature 98.4 F (36.9 C), temperature source Oral, resp. rate 14, height 5\' 5"  (1.651 m), weight 66.6 kg, SpO2 93 %. Physical Exam: Blood pressure 134/72, pulse 72, temperature 98.5 F (36.9 C), temperature source Oral, resp. rate 18, height 5\' 5"  (1.651 m), weight 66.6 kg, SpO2 94 %. Physical Exam Vitals and nursing note reviewed.  Constitutional: awake, alert, but slow to process, NAD- sitting up in bed HENT:  Mild L facial droop no change; Conjugate gaze Cardiovascular: RRR_ no JVD Pulmonary: CTA B/L- no W/R/R- good air movement Abdominal: Soft, NT, ND, (+)BS   Musculoskeletal:     Cervical back: Normal range of motion. No rigidity.     Comments: RUE/RLE- 5-/5 in all muscles tested LUE and LLE- 1/5 in biceps, triceps, WE, grip, HF, KE, DF and PF  Skin:    General: Skin is warm and dry.     Comments: IV L antecubital fossa- looks good No wound on bottom; however L heel a little boggy - no PRAFO in place yet Neurological: Alert;  does display some decrease in awareness of her deficits. Decreased sensation to light touch in LUE/LLE- facial sensation normal per pt       Assessment/Plan: 1. 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  Physiatrist is providing close team supervision and 24 hour management of active medical problems listed below.  Physiatrist and rehab team continue to assess barriers to discharge/monitor patient progress toward functional and medical goals  Care Tool:  Bathing              Bathing assist       Upper Body Dressing/Undressing Upper body dressing        Upper body assist      Lower Body Dressing/Undressing Lower body dressing            Lower body assist       Toileting Toileting    Toileting assist Assist for toileting: Minimal Assistance - Patient > 75% (bedpan)     Transfers Chair/bed transfer  Transfers assist  Chair/bed transfer activity did not occur: Safety/medical concerns  Chair/bed transfer assist level: 2 Helpers     Locomotion Ambulation   Ambulation assist   Ambulation activity did not occur: Safety/medical concerns          Walk 10 feet activity   Assist  Walk 10 feet activity did not occur: Safety/medical concerns        Walk 50  feet activity   Assist Walk 50 feet with 2 turns activity did not occur: Safety/medical concerns         Walk 150 feet activity   Assist Walk 150 feet activity did not occur: Safety/medical concerns         Walk 10 feet on uneven surface  activity   Assist Walk 10 feet on uneven surfaces activity did not occur: Safety/medical concerns         Wheelchair     Assist Will patient use wheelchair at discharge?: Yes Type of Wheelchair: Manual Wheelchair activity did not occur: Safety/medical concerns         Wheelchair 50 feet with 2 turns activity    Assist    Wheelchair 50 feet with 2 turns activity did not occur: Safety/medical concerns       Wheelchair 150 feet activity     Assist  Wheelchair 150 feet activity  did not occur: Safety/medical concerns       Blood pressure 131/70, pulse 73, temperature 98.4 F (36.9 C), temperature source Oral, resp. rate 14, height 5\' 5"  (1.651 m), weight 66.6 kg, SpO2 93 %.  Medical Problem List and Plan: 1.  Left-sided weakness/almost hemiplegia secondary to right basal ganglia and corona radiata infarct             -patient may  shower             -ELOS/Goals: 14-18 days- goals CGA- mod I 2.  Antithrombotics: -DVT/anticoagulation: SCDs             -antiplatelet therapy: Aspirin 81 mg daily Plavix 85 mg daily and BMS investigational med 3. Pain Management: Tramadol as needed  9/22- still c/o L knee pain, but will con't Tramadol 50 mg q6 hours prn for now.  4. Mood: Provide emotional support             -antipsychotic agents: N/A 5. Neuropsych: This patient is capable of making decisions on her own behalf. 6. Skin/Wound Care: Routine skin checks 7. Fluids/Electrolytes/Nutrition: Routine in and outs with follow-up chemistries  9/22- labs look good on admission 8.  Hyperlipidemia.  Lipitor 9.  Hypothyroidism.  Synthroid 10.  Asthma.  Continue inhaler as needed 11.  Overactive bladder.  Ditropan 12.  Constipation.  MiraLAX twice daily as well as Senokot 2 tablets twice daily. 13.Glaucoma.pt says she doesn't take drops and doesn't have this dx.   9/22- will check with family.  14. L foot drop- will order PRAFO.  9/22- has been ordered- not here yet.       LOS: 1 days A FACE TO FACE EVALUATION WAS PERFORMED  Nicole [name] 07/27/2020, 11:28 AM

## 2020-07-28 ENCOUNTER — Inpatient Hospital Stay (HOSPITAL_COMMUNITY): Payer: Medicare Other | Admitting: Physical Therapy

## 2020-07-28 ENCOUNTER — Inpatient Hospital Stay (HOSPITAL_COMMUNITY): Payer: Medicare Other

## 2020-07-28 MED ORDER — INFLUENZA VAC A&B SA ADJ QUAD 0.5 ML IM PRSY
0.5000 mL | PREFILLED_SYRINGE | INTRAMUSCULAR | Status: AC
  Administered 2020-07-29: 0.5 mL via INTRAMUSCULAR
  Filled 2020-07-28: qty 0.5

## 2020-07-28 NOTE — Care Management (Addendum)
Inpatient Rehabilitation Center Individual Statement of Services  Patient Name:  Nicole [name]  Date:  07/28/2020  Welcome to the Inpatient Rehabilitation Center.  Our goal is to provide you with an individualized program based on your diagnosis and situation, designed to meet your specific needs.  With this comprehensive rehabilitation program, you will be expected to participate in at least 3 hours of rehabilitation therapies Monday-Friday, with modified therapy programming on the weekends.  Your rehabilitation program will include the following services:  Physical Therapy (PT), Occupational Therapy (OT), Speech Therapy (ST), 24 hour per day rehabilitation nursing, Therapeutic Recreaction (TR), Psychology, Neuropsychology, Care Coordinator, Rehabilitation Medicine, Nutrition Services, Pharmacy Services and Other  Weekly team conferences will be held on Tuesdays to discuss your progress.  Your Inpatient Rehabilitation Care Coordinator will talk with you frequently to get your input and to update you on team discussions.  Team conferences with you and your family in attendance may also be held.  Expected length of stay: 18-21 days   Overall anticipated outcome: Minimal Assistance  Depending on your progress and recovery, your program may change. Your Inpatient Rehabilitation Care Coordinator will coordinate services and will keep you informed of any changes. Your Inpatient Rehabilitation Care Coordinator's name and contact numbers are listed  below.  The following services may also be recommended but are not provided by the Inpatient Rehabilitation Center:   Driving Evaluations  Home Health Rehabiltiation Services  Outpatient Rehabilitation Services  Vocational Rehabilitation   Arrangements will be made to provide these services after discharge if needed.  Arrangements include referral to agencies that provide these services.  Your insurance has been verified to be:  [employer]  Your primary doctor is:  Lurena Nida  Pertinent information will be shared with your doctor and your insurance company.  Inpatient Rehabilitation Care Coordinator:  Susie Cassette [phone] or ([phone]  Information discussed with and copy given to patient by: Gretchen Short, 07/28/2020, 8:53 AM

## 2020-07-28 NOTE — Progress Notes (Signed)
[facility] PHYSICAL MEDICINE & REHABILITATION PROGRESS NOTE   Subjective/Complaints:  Pt reports was nauseated yesterday- but is resolved now.   Still ate <25% of breakfast- "not hungry"- asked OT to push/encourage a snack.   ROS:   Pt denies SOB, abd pain, CP, N/V/C/D, and vision changes   Objective:   No results found. Recent Labs    07/27/20 0646  WBC 9.6  HGB 12.1  HCT 38.3  PLT 268   Recent Labs    07/27/20 0646  NA 137  K 4.1  CL 104  CO2 25  GLUCOSE 106*  BUN 15  CREATININE 0.75  CALCIUM 9.2    Intake/Output Summary (Last 24 hours) at 07/28/2020 1047 Last data filed at 07/27/2020 1900 Gross per 24 hour  Intake 240 ml  Output --  Net 240 ml        Physical Exam: Vital Signs Blood pressure (!) 174/71, pulse 71, temperature 98.2 F (36.8 C), resp. rate 16, height 5\' 5"  (1.651 m), weight 66.6 kg, SpO2 100 %. Physical Exam: Blood pressure 134/72, pulse 72, temperature 98.5 F (36.9 C), temperature source Oral, resp. rate 18, height 5\' 5"  (1.651 m), weight 66.6 kg, SpO2 94 %. Physical Exam Vitals and nursing note reviewed.  Constitutional: laying in bed- wearing mask- appropriate, quiet, NAD; OT in room HENT:   Conjugate gaze Cardiovascular: RRR- no JVD Pulmonary: CTA B/L- no W/R/R- good air movement Abdominal: Soft, NT, ND, (+)BS  Musculoskeletal:     Cervical back: Normal range of motion. No rigidity.     Comments: RUE/RLE- 5-/5 in all muscles tested LUE and LLE- 1/5 in biceps, triceps, WE, grip, HF, KE, DF and PF  Skin:    General: Skin is warm and dry.     Comments: IV L antecubital fossa- looks good No wound on bottom; however L heel a little boggy - no PRAFO in place yet Neurological: Alert;  does display some decrease in awareness of her deficits. Decreased sensation to light touch in LUE/LLE- facial sensation normal per pt       Assessment/Plan: 1. 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  Physiatrist is providing close team supervision and 24 hour management of active medical problems listed below.  Physiatrist and rehab team continue to assess barriers to discharge/monitor patient progress toward functional and medical goals  Care Tool:  Bathing    Body parts bathed by patient: Chest, Abdomen, Left arm, Face   Body parts bathed by helper: Right arm, Front perineal area, Buttocks, Right upper leg, Left upper leg, Right lower leg, Left lower leg     Bathing assist Assist Level: Maximal Assistance - Patient 24 - 49%     Upper Body Dressing/Undressing Upper body dressing   What is the patient wearing?: Bra, Pull over shirt    Upper body assist Assist Level: Maximal Assistance - Patient 25 - 49%    Lower Body Dressing/Undressing Lower body dressing      What is the patient wearing?: Incontinence brief, Pants     Lower body assist Assist for lower body dressing: Total Assistance - Patient < 25%     Toileting Toileting Toileting Activity did not occur and hygiene only): N/A (no void or bm)  Toileting assist Assist for toileting: Maximal Assistance - Patient 25 - 49%     Transfers Chair/bed transfer  Transfers assist  Chair/bed transfer activity did not occur: Refused  Chair/bed transfer assist level: 2 Helpers     Locomotion Ambulation   Ambulation assist   Ambulation activity did not occur: Safety/medical concerns          Walk 10 feet activity   Assist  Walk 10 feet activity did not occur: Safety/medical concerns        Walk 50 feet activity   Assist Walk 50 feet with 2 turns activity did not occur: Safety/medical concerns         Walk 150 feet activity   Assist Walk 150 feet activity did not occur: Safety/medical concerns         Walk 10 feet on uneven surface  activity   Assist Walk 10 feet on  uneven surfaces activity did not occur: Safety/medical concerns         Wheelchair     Assist Will patient use wheelchair at discharge?: Yes Type of Wheelchair: Manual Wheelchair activity did not occur: Safety/medical concerns         Wheelchair 50 feet with 2 turns activity    Assist    Wheelchair 50 feet with 2 turns activity did not occur: Safety/medical concerns       Wheelchair 150 feet activity     Assist  Wheelchair 150 feet activity did not occur: Safety/medical concerns       Blood pressure (!) 174/71, pulse 71, temperature 98.2 F (36.8 C), resp. rate 16, height 5\' 5"  (1.651 m), weight 66.6 kg, SpO2 100 %.  Medical Problem List and Plan: 1.  Left-sided weakness/almost hemiplegia secondary to right basal ganglia and corona radiata infarct             -patient may  shower             -ELOS/Goals: 14-18 days- goals CGA- mod I 2.  Antithrombotics: -DVT/anticoagulation: SCDs             -antiplatelet therapy: Aspirin 81 mg daily Plavix 85 mg daily and BMS investigational med 3. Pain Management: Tramadol as needed  9/22- still c/o L knee pain, but will con't Tramadol 50 mg q6 hours prn for now.  4. Mood: Provide emotional support             -antipsychotic agents: N/A 5. Neuropsych: This patient is capable of making decisions on her own behalf. 6. Skin/Wound Care: Routine skin checks 7. Fluids/Electrolytes/Nutrition: Routine in and outs with follow-up chemistries  9/22- labs look good on admission 8.  Hyperlipidemia.  Lipitor 9.  Hypothyroidism.  Synthroid 10.  Asthma.  Continue inhaler as needed 11.  Overactive bladder.  Ditropan 12.  Constipation.  MiraLAX twice daily as well as Senokot 2 tablets twice daily. 13.Glaucoma.pt says she doesn't take drops and doesn't have this dx.   9/22- will check with family.  14. L foot drop- will order PRAFO.  9/22- has been ordered- not here yet.  9/23- didn't see in room- will check into   15. HTN-  9/23-  BP 170s/70s this AM- hadn't been this high- is somewhat variable- if continues to stay high, will change BP meds  16. Poor appetite  9/23- will check CMP in AM- to see total protein and albumin- and check a prealbumin- I'M concerned she's eating <25% of meals (so far only seen breakfast).      LOS: 2 days A FACE TO FACE EVALUATION WAS PERFORMED  Dannisha Eckmann 07/28/2020, 10:47 AM

## 2020-07-28 NOTE — Progress Notes (Signed)
Speech Language Pathology Daily Session Note  Patient Details  Name: Nicole [name] MRN: [id number] Date of Birth: Apr 23, 1930  Today's Date: 07/28/2020 SLP Individual Time: 1000-1057 SLP Individual Time Calculation (min): 57 min  Short Term Goals: Week 1: SLP Short Term Goal 1 (Week 1): Pt will utilize external memory aids to recall new, daily information with Min A verbal cues. SLP Short Term Goal 2 (Week 1): Pt will attend to left field of environement during functional tasks with mod A verbal cues. SLP Short Term Goal 3 (Week 1): Pt will demonstrate functional problem solving for familiar mildly complex tasks with mod A verbal cues. SLP Short Term Goal 4 (Week 1): Pt will demonstrate  intellectual awareness by identifying 2 cognitive and 2 physical impairments with mod A verbal and visual cues.  Skilled Therapeutic Interventions: Skilled SLP intervention focused on cognition. Pt completed money management task with bills, coins, and verbal problem solving questions. Pt completed calculations with min A-mod A due to occasionally mishearing amounts given by ST. Deducting reasoning scheduling task completed with min A at end of task to increase error awareness with 1 scheduled event out of 8. Pt participated well with SLP tx. May benefit from sound amplifier during next tx sessions. Cont with therapy per plan of care.      Pain Pain Assessment Pain Scale: Faces Faces Pain Scale: No hurt  Therapy/Group: Individual Therapy  Nicole [name] 07/28/2020, 10:53 AM

## 2020-07-28 NOTE — Progress Notes (Signed)
Physical Therapy Session Note  Patient Details  Name: Nicole [name] MRN: [id number] Date of Birth: 10-22-1930  Today's Date: 07/28/2020 PT Individual Time: 4098-1191 PT Individual Time Calculation (min): 68 min   Short Term Goals: Week 1:  PT Short Term Goal 1 (Week 1): Pt will maintain static sitting balance x 5 min with min A PT Short Term Goal 2 (Week 1): Pt will perform least restrictive transfer with assist x 1 PT Short Term Goal 3 (Week 1): Pt will initiate w/c mobility PT Short Term Goal 4 (Week 1): Pt will initiate gait training as safe and able  Skilled Therapeutic Interventions/Progress Updates:    pt received in bed and agreeable to therapy. Pt directed in supine>sit EOB mod A with rolling to R and single step VC with mod A for sitting balance but improved to min A and brief CGA. Steady used with 2 helpers for transfer to [facility], total A to gym. Pt transferred to [facility] with steady for safety with 2 helpers and directed in dynamic sitting activity with various levels of assist throughout: 3x6 reaches with RUE to L cross midline outside BOS initially min A and CGA for inside BOS however with fatigue on final set mod-max A to return to midline; extra time spent throughout with mirror feedback for visual assist to improve pt's sitting posture and hip alignment, manual assist for trunk extension, head carriage, upright midline sitting position, equal hip alinement and BLE placement on floor with 5x1 mins pt to hold at CGA-min A. Pt very fatigued at this time and required prolonged rest break with therapist assisting at total A for balance behind pt to allow rest. Pt then directed in maintaining sitting balance with BLE and BUE support for Steady placement, transfer to chair with 2 helpers and total A to room in WC. Steady used to return to bed, mod A sit>supine. Pt left in bed, alarm on, All needs in reach and in good condition. Call light in hand.    Therapy Documentation Precautions:   Precautions Precautions: Fall Precaution Comments: Left side weakness. Inattention to L Restrictions Weight Bearing Restrictions: No General: PT Amount of Missed Time (min): 7 Minutes PT Missed Treatment Reason: Patient fatigue    Therapy/Group: Individual Therapy  Barbaraann Faster 07/28/2020, 4:03 PM

## 2020-07-28 NOTE — Progress Notes (Signed)
Occupational Therapy Session Note  Patient Details  Name: Nicole [name] MRN: [id number] Date of Birth: [birth year]  Today's Date: 07/28/2020 OT Individual Time: 5885-0277 OT Individual Time Calculation (min): 60 min  and Today's Date: 07/28/2020 OT Missed Time: 15 Minutes Missed Time Reason: Patient fatigue   Short Term Goals: Week 1:  OT Short Term Goal 1 (Week 1): Pt will complete UB dressing with min A OT Short Term Goal 2 (Week 1): Pt will recall hemi dressing techniques with no more than min cueing OT Short Term Goal 3 (Week 1): Pt will increase functional activity tolerance by remaining OOB for 30 min OT Short Term Goal 4 (Week 1): Pt will maintain static sitting balance with min A during ADL task  Skilled Therapeutic Interventions/Progress Updates:    Pt resting in bed upon arrival with MD present.  Pt denies nausea. OT intervention with focus on bed mobility, functional transfers, sit<>stand, standing balance, BADL retraining, activity tolerance, and safety awareness to increase independence with BADLs. Supine>sit EOB with max A. Sitting balance with min A. Squat pivot transfer to w/c with max A. Pt engaged in bathing/dressing w/c level. See Care Tool for assist levels. After UB bathing/dressing pt c/o increased fatigue and requested to return to bed.  Pt placed head on edge of sink and said she couldn't do any more. Pt agreed donning pants before returning to bed.  Pt max for LB dressing. Sit<>stand in [place] with max A and pt resting head on cross bar. Tot A for sit>supine in bed. Pt remained in bed with all needs within reach and bed alarm activated. Pt missed 15 mins skilled OT services secondary to fatigue.   Therapy Documentation Precautions:  Precautions Precautions: Fall Precaution Comments: Left side weakness. Inattention to L Restrictions Weight Bearing Restrictions: No General: General OT Amount of Missed Time: 15 Minutes Pain:  Pt denies pain   Therapy/Group:  Individual Therapy  Rich Brave 07/28/2020, 9:20 AM

## 2020-07-28 NOTE — Progress Notes (Signed)
   Patient Details  Name: Nicole [name] MRN: [id number] Date of Birth: 11-10-1929  Today's Date: 07/28/2020  Hospital Problems: Principal Problem:   Cerebrovascular accident (CVA) of right basal ganglia [facility])  Past Medical History: History reviewed. No pertinent past medical history. Past Surgical History: History reviewed. No pertinent surgical history. Social History:  reports that she has never smoked. She has never used smokeless tobacco. She reports that she does not drink alcohol and does not use drugs.  Family / Support Systems Marital Status: Single Patient Roles: Parent Spouse/Significant Other: N/A Children: daughter [name] Other Supports: N/A Anticipated Caregiver: daughter Ability/Limitations of Caregiver: Daughter works hybrid work schedule in [place] 2 weeks on and then 2 weeks from home. She has requested to work remotely. Caregiver Availability: 24/7 Family Dynamics: Pt lives with her daughter  Social History Preferred language: English Religion: Other Cultural Background: Pt report she worked all kinds of jobs. Education: 10th grade Read: Yes Write: Yes Employment Status: Retired Public relations account executive Issues: Denies Guardian/Conservator: N/A   Abuse/Neglect Abuse/Neglect Assessment Can Be Completed: Yes Physical Abuse: Denies Verbal Abuse: Denies Sexual Abuse: Denies Exploitation of patient/patient's resources: Denies Self-Neglect: Denies  Emotional Status Pt's affect, behavior and adjustment status: Pt in good spirits at time of visit Recent Psychosocial Issues: Denies Psychiatric History: Denies Substance Abuse History: Denies  Patient / Family Perceptions, Expectations & Goals Pt/Family understanding of illness & functional limitations: Pt and daughter have a general understanding of care needs Premorbid pt/family roles/activities: Independent Anticipated changes in roles/activities/participation: Assistance with ADLs/IADLs Pt/family  expectations/goals: Pt goal is to get "left arm and leg moving."  [employer]: None Premorbid Home Care/DME Agencies: None Transportation available at discharge: Daughter Resource referrals recommended: Neuropsychology  Discharge Planning Living Arrangements: Children Support Systems: Children Type of Residence: Private residence Insurance Resources: Multimedia programmer (specify) (Blue Medicare) Financial Resources: SSI, Family Support Financial Screen Referred: No Living Expenses: Own Money Management: Patient Does the patient have any problems obtaining your medications?: No Home Management: Pt performed all housekeeping and meal prep needs Patient/Family Preliminary Plans: will need set up for meals Care Coordinator Barriers to Discharge: Decreased caregiver support, Lack of/limited family support Care Coordinator Anticipated Follow Up Needs: HH/OP Expected length of stay: 2.5-3 weeks  Clinical Impression SW met with pt in room to introduce self, explain role, and discuss discharge process. Pt not a veteran. No HCPOA. No DME. Pt aware SW to follow-up with her dtr [name]. SW spoke with dtr [name] [phone]) to introduce self, explain role, and discuss discharge process. She states that she will be providing care to patient and hoping will be able to work remotely. She has FMLA forms completed already. She asked if pt can have Product/process development scientist (last shot in Feb) and flu vaccine. SW informed will discuss with medical team. SW to follow-up with updates after team conference.   SW informed medical team on requests. Attending states will get pt booster vaccine if available, and flu vaccine ordered. SW relayed information to dtr [name].   Fayelynn Distel A Letica Giaimo 07/28/2020, 12:10 PM

## 2020-07-29 ENCOUNTER — Inpatient Hospital Stay (HOSPITAL_COMMUNITY): Payer: Medicare Other | Admitting: Speech Pathology

## 2020-07-29 ENCOUNTER — Inpatient Hospital Stay (HOSPITAL_COMMUNITY): Payer: Medicare Other | Admitting: Physical Therapy

## 2020-07-29 ENCOUNTER — Inpatient Hospital Stay (HOSPITAL_COMMUNITY): Payer: Medicare Other

## 2020-07-29 LAB — COMPREHENSIVE METABOLIC PANEL
ALT: 27 U/L (ref 0–44)
AST: 30 U/L (ref 15–41)
Albumin: 3.9 g/dL (ref 3.5–5.0)
Alkaline Phosphatase: 61 U/L (ref 38–126)
Anion gap: 12 (ref 5–15)
BUN: 17 mg/dL (ref 8–23)
CO2: 26 mmol/L (ref 22–32)
Calcium: 9.9 mg/dL (ref 8.9–10.3)
Chloride: 103 mmol/L (ref 98–111)
Creatinine, Ser: 0.9 mg/dL (ref 0.44–1.00)
GFR calc Af Amer: >60 mL/min (ref >60)
GFR calc non Af Amer: 57 mL/min — ABNORMAL LOW (ref >60)
Glucose, Bld: 91 mg/dL (ref 70–99)
Potassium: 3.4 mmol/L — ABNORMAL LOW (ref 3.5–5.1)
Sodium: 141 mmol/L (ref 135–145)
Total Bilirubin: 1.2 mg/dL (ref 0.3–1.2)
Total Protein: 8.5 g/dL — ABNORMAL HIGH (ref 6.5–8.1)

## 2020-07-29 LAB — PREALBUMIN: Prealbumin: 30 mg/dL (ref 18–38)

## 2020-07-29 MED ORDER — POTASSIUM CHLORIDE CRYS ER 20 MEQ PO TBCR
40.0000 meq | EXTENDED_RELEASE_TABLET | Freq: Two times a day (BID) | ORAL | Status: AC
  Administered 2020-07-29 – 2020-07-30 (×2): 40 meq via ORAL
  Filled 2020-07-29 (×2): qty 2

## 2020-07-29 MED ORDER — SORBITOL 70 % SOLN
30.0000 mL | Freq: Once | Status: AC
  Administered 2020-07-29: 30 mL via ORAL
  Filled 2020-07-29: qty 30

## 2020-07-29 NOTE — IPOC Note (Signed)
Overall Plan of Care [facility]) Patient Details Name: Nicole [name] MRN: [id number] DOB: 07/28/1930  Admitting Diagnosis: Cerebrovascular accident (CVA) of right basal ganglia [facility])  Hospital Problems: Principal Problem:   Cerebrovascular accident (CVA) of right basal ganglia (HCC)     Functional Problem List: Nursing Bladder, Bowel, Endurance, Medication Management, Nutrition, Safety, Pain  PT Balance, Edema, Endurance, Motor, Pain, Perception, Safety, Sensory  OT Balance, Perception, Safety, Vision, Pain, Cognition, Edema, Skin Integrity, Endurance, Motor  SLP Cognition, Linguistic  TR         Basic ADL's: OT Eating, Grooming, Bathing, Dressing, Toileting     Advanced  ADL's: OT       Transfers: PT Bed Mobility, Bed to Chair, Car, [employer], Floor  OT Toilet, Tub/Shower     Locomotion: PT Ambulation, Psychologist, prison and probation services, Stairs     Additional Impairments: OT Fuctional Use of Upper Extremity  SLP Social Cognition   Problem Solving, Memory, Awareness  TR      Anticipated Outcomes Item Anticipated Outcome  Self Feeding set up assist  Swallowing      Basic self-care  (S)  Toileting  min A   Bathroom Transfers min A  Bowel/Bladder  Min assist  Transfers  min A  Locomotion  min A with LRAD  Communication     Cognition  Supervision A for cognition.  Pain  <3 on a 0-10 pain scale  Safety/Judgment  Min assist   Therapy Plan: PT Intensity: Minimum of 1-2 x/day ,45 to 90 minutes PT Frequency: 5 out of 7 days PT Duration Estimated Length of Stay: 18-21 days OT Intensity: Minimum of 1-2 x/day, 45 to 90 minutes OT Frequency: 5 out of 7 days OT Duration/Estimated Length of Stay: 3 weeks SLP Intensity: Minumum of 1-2 x/day, 30 to 90 minutes SLP Frequency: 3 to 5 out of 7 days SLP Duration/Estimated Length of Stay: 18-21 days.   Due to the current state of emergency, patients may not be receiving their 3-hours of Medicare-mandated therapy.   Team  Interventions: Nursing Interventions Patient/Family Education, Bladder Management, Bowel Management, Disease Management/Prevention, Pain Management, Medication Management, Discharge Planning, Psychosocial Support  PT interventions Ambulation/gait training, Warden/ranger, Community reintegration, Discharge planning, Disease management/prevention, DME/adaptive equipment instruction, Functional electrical stimulation, Functional mobility training, Neuromuscular re-education, Pain management, Patient/family education, Psychosocial support, Splinting/orthotics, Stair training, Therapeutic Activities, Therapeutic Exercise, UE/LE Strength taining/ROM, UE/LE Coordination activities, Visual/perceptual remediation/compensation, Wheelchair propulsion/positioning  OT Interventions Balance/vestibular training, Discharge planning, Functional electrical stimulation, Pain management, Self Care/advanced ADL retraining, Therapeutic Activities, UE/LE Coordination activities, Visual/perceptual remediation/compensation, Therapeutic Exercise, Skin care/wound managment, Patient/family education, Functional mobility training, Disease mangement/prevention, Cognitive remediation/compensation, DME/adaptive equipment instruction, Neuromuscular re-education, Community reintegration, Psychosocial support, Splinting/orthotics, UE/LE Strength taining/ROM, Wheelchair propulsion/positioning  SLP Interventions Cognitive remediation/compensation, Functional tasks, Therapeutic Activities, Patient/family education  TR Interventions    SW/CM Interventions Discharge Planning, Psychosocial Support, Patient/Family Education   Barriers to Discharge MD  Medical stability, Home enviroment access/loayout, Lack of/limited family support, Weight and Weight bearing restrictions  Nursing Decreased caregiver support, Home environment access/layout, Lack of/limited family support, Medication compliance    PT Home environment access/layout     OT      SLP      SW Decreased caregiver support, Lack of/limited family support     Team Discharge Planning: Destination: PT-Home ,OT- Home , SLP-Home Projected Follow-up: PT-Home health PT, 24 hour supervision/assistance, OT-  Home health OT, SLP-Home Health SLP Projected Equipment Needs: PT-To be determined, OT- To be determined, SLP-None recommended by SLP Equipment  Details: PT-TBD pending progress, OT-  Patient/family involved in discharge planning: PT- Patient, Family member/caregiver,  OT-Patient, SLP-Patient, Family member/caregiver  MD ELOS: 18-21 days Medical Rehab Prognosis:  Good Assessment: Pt is an 86 yr old female with hx of being I- as well as Hypothyroidism, HLD, and Asthma, with L knee DJD here with L hemiplegia due to R basal ganglia/corona radiata stroke.  She has L foot drop, which is new, and HTN- which is not.   Goals are Min A to s/u- due to her 1/5 strength on left side when she was admitted.    See Team Conference Notes for weekly updates to the plan of care

## 2020-07-29 NOTE — Progress Notes (Signed)
Speech Language Pathology Daily Session Note  Patient Details  Name: Nicole [name] MRN: [id number] Date of Birth: 1930-06-06  Today's Date: 07/29/2020 SLP Individual Time: 1300-1400 SLP Individual Time Calculation (min): 60 min  Short Term Goals: Week 1: SLP Short Term Goal 1 (Week 1): Pt will utilize external memory aids to recall new, daily information with Min A verbal cues. SLP Short Term Goal 2 (Week 1): Pt will attend to left field of environement during functional tasks with mod A verbal cues. SLP Short Term Goal 3 (Week 1): Pt will demonstrate functional problem solving for familiar mildly complex tasks with mod A verbal cues. SLP Short Term Goal 4 (Week 1): Pt will demonstrate  intellectual awareness by identifying 2 cognitive and 2 physical impairments with mod A verbal and visual cues.  Skilled Therapeutic Interventions:   Patient seen to address cognitive-linguistic goals during skilled ST session. She was able to perform bed to [facility] transfer with SLP assisting with [facility] and with minA verbal cues. She performed simulated pill box setup task with minA verbal cues and demonstrated some spontaneous error-checking. She described current deficits as her left arm and leg, but later when SLP took her to ADL kitchen, she did say, "I wont be able to open a can with one hand". During pill box task she also started to demonstrate some awareness to difficulties related to only being able to use right hand/arm at this time. She attended left during functional tasks with supervisionA. Patient continues to benefit from skilled SLP intervention to maximize cognitive-linguistic function prior to discharge.   Pain Pain Assessment Pain Scale: 0-10 Pain Score: 0-No pain  Therapy/Group: Individual Therapy  Angela Nevin, MA, CCC-SLP 07/29/20 5:00 PM

## 2020-07-29 NOTE — Progress Notes (Signed)
[place] PHYSICAL MEDICINE & REHABILITATION PROGRESS NOTE   Subjective/Complaints:  Pt reports LBM 3 days ago- feels like needs to go.   But needs help to go, per pt.  Asking for bowel meds.   Doing therapy right now- working on [employer].   ROS:  Pt denies SOB, abd pain, CP, N/V/C/D, and vision changes    Objective:   No results found. Recent Labs    07/27/20 0646  WBC 9.6  HGB 12.1  HCT 38.3  PLT 268   Recent Labs    07/27/20 0646 07/29/20 1251  NA 137 141  K 4.1 3.4*  CL 104 103  CO2 25 26  GLUCOSE 106* 91  BUN 15 17  CREATININE 0.75 0.90  CALCIUM 9.2 9.9    Intake/Output Summary (Last 24 hours) at 07/29/2020 1605 Last data filed at 07/29/2020 1326 Gross per 24 hour  Intake 240 ml  Output --  Net 240 ml        Physical Exam: Vital Signs Blood pressure (!) 142/56, pulse 72, temperature 98 F (36.7 C), resp. rate 16, height 5\' 5"  (1.651 m), weight 66.6 kg, SpO2 95 %. Physical Exam Vitals and nursing note reviewed.  Constitutional:sitting up on [place], in dayroom, appropriate, NAD- wearing mask HENT:   Conjugate gaze Cardiovascular: RRR Pulmonary: CTA B/L- no W/R/R- good air movement Abdominal: soft, somewhat distended; NT, hypoactive BS  Musculoskeletal:     Cervical back: Normal range of motion. No rigidity.     Comments: RUE/RLE- 5-/5 in all muscles tested LUE and LLE- 2 to 2-/5 in biceps, triceps, WE, grip, HF, KE, DF and PF - improved from admission Skin:    General: Skin is warm and dry.     Comments: IV L antecubital fossa- looks good No wound on bottom; however L heel a little boggy - no PRAFO in place yet Neurological: Alert;  does display some decrease in awareness of her deficits. Decreased sensation to light touch in LUE/LLE- facial sensation normal per pt       Assessment/Plan: 1. 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  Physiatrist is providing close team supervision and 24 hour management of active medical problems listed below.  Physiatrist and rehab team continue to assess barriers to discharge/monitor patient progress toward functional and medical goals  Care Tool:  Bathing    Body parts bathed by patient: Chest, Abdomen, Left arm, Face   Body parts bathed by helper: Right arm, Front perineal area, Buttocks, Right upper leg, Left upper leg, Right lower leg, Left lower leg     Bathing assist Assist Level: Maximal Assistance - Patient 24 - 49%     Upper Body Dressing/Undressing Upper body dressing   What is the patient wearing?: Bra, Pull over shirt    Upper body assist Assist Level: Maximal Assistance - Patient 25 - 49%    Lower Body Dressing/Undressing Lower body dressing      What is the patient wearing?: Incontinence brief, Pants     Lower body assist Assist for lower body dressing: Total Assistance - Patient < 25%     Toileting Toileting Toileting Activity did not occur (Clothing management and hygiene only): N/A (no void or bm)  Toileting assist Assist for toileting: Moderate Assistance - Patient 50 - 74%     Transfers Chair/bed transfer  Transfers assist  Chair/bed transfer activity did not occur:  Refused  Chair/bed transfer assist level: 2 Helpers     Locomotion Ambulation   Ambulation assist   Ambulation activity did not occur: Safety/medical concerns          Walk 10 feet activity   Assist  Walk 10 feet activity did not occur: Safety/medical concerns        Walk 50 feet activity   Assist Walk 50 feet with 2 turns activity did not occur: Safety/medical concerns         Walk 150 feet activity   Assist Walk 150 feet activity did not occur: Safety/medical concerns         Walk 10 feet on uneven surface  activity   Assist Walk 10 feet on uneven surfaces activity did not occur: Safety/medical  concerns         Wheelchair     Assist Will patient use wheelchair at discharge?: Yes Type of Wheelchair: Manual Wheelchair activity did not occur: Safety/medical concerns         Wheelchair 50 feet with 2 turns activity    Assist    Wheelchair 50 feet with 2 turns activity did not occur: Safety/medical concerns       Wheelchair 150 feet activity     Assist  Wheelchair 150 feet activity did not occur: Safety/medical concerns       Blood pressure (!) 142/56, pulse 72, temperature 98 F (36.7 C), resp. rate 16, height 5\' 5"  (1.651 m), weight 66.6 kg, SpO2 95 %.  Medical Problem List and Plan: 1.  Left-sided weakness/almost hemiplegia secondary to right basal ganglia and corona radiata infarct             -patient may  shower             -ELOS/Goals: 14-18 days- goals CGA- mod I 2.  Antithrombotics: -DVT/anticoagulation: SCDs             -antiplatelet therapy: Aspirin 81 mg daily Plavix 85 mg daily and BMS investigational med 3. Pain Management: Tramadol as needed  9/22- still c/o L knee pain, but will con't Tramadol 50 mg q6 hours prn for now.  9/24- less pain complaints- con't meds  4. Mood: Provide emotional support             -antipsychotic agents: N/A 5. Neuropsych: This patient is capable of making decisions on her own behalf. 6. Skin/Wound Care: Routine skin checks 7. Fluids/Electrolytes/Nutrition: Routine in and outs with follow-up chemistries  9/22- labs look good on admission 8.  Hyperlipidemia.  Lipitor 9.  Hypothyroidism.  Synthroid 10.  Asthma.  Continue inhaler as needed 11.  Overactive bladder.  Ditropan 12.  Constipation.  MiraLAX twice daily as well as Senokot 2 tablets twice daily.  9/24- no BM in 3 days- will add Sorbitol today and see if she goes 13.Glaucoma.pt says she doesn't take drops and doesn't have this dx.   9/22- will check with family.  14. L foot drop- will order PRAFO.  9/22- has been ordered- not here yet.  9/23-  didn't see in room- will check into   15. HTN-  9/23- BP 170s/70s this AM- hadn't been this high- is somewhat variable- if continues to stay high, will change BP meds  16. Poor appetite  9/23- will check CMP in AM- to see total protein and albumin- and check a prealbumin- I'M concerned she's eating <25% of meals (so far only seen breakfast).   9/24- pt's prealbumin is 30 (18-38), Albumin is good at  3.9 and total protein good at 8.5- con't regimen 17. Hypokalemia  9/24- K+ 3.4- will replete and recheck Monday.     LOS: 3 days A FACE TO FACE EVALUATION WAS PERFORMED  Mariesha Venturella 07/29/2020, 4:05 PM

## 2020-07-29 NOTE — Progress Notes (Signed)
Physical Therapy Session Note  Patient Details  Name: Nicole [name] MRN: [id number] Date of Birth: 22-May-1930  Today's Date: 07/29/2020 PT Individual Time: 0800-0856 PT Individual Time Calculation (min): 56 min   Short Term Goals: Week 1:  PT Short Term Goal 1 (Week 1): Pt will maintain static sitting balance x 5 min with min A PT Short Term Goal 2 (Week 1): Pt will perform least restrictive transfer with assist x 1 PT Short Term Goal 3 (Week 1): Pt will initiate w/c mobility PT Short Term Goal 4 (Week 1): Pt will initiate gait training as safe and able  Skilled Therapeutic Interventions/Progress Updates:    pt received in bed and agreeable to therapy. Pt directed in donning TED hose, pants in bed total A; rolling with bed flat at mod A, supine>sit mod A, sitting balance at EOB min A statically and mod A dynamically once mod A to position at EOB. Pt directed in doffing gown max A; donning bra, shirt and B socks at EOB max A for all. Pt directed in STS via steady with 2 helpers with pt able to place LUE on grab bar with use of RUE and maintain grip on L hand throughout transfer, pt also directed in actively assisting herself into full standing for setup of steady then sitting for transfer to [facility] for safety. Pt taken to gym total A in [facility] for time and energy conservation. Pt directed in kinetron for improved reciprocal stepping with quad tapping on L quad initially for improved motor activation with good effect, initial one min a 0% then for 3 mins 10% with pt actively mobilizing BLE for reciprocal mobility, rest break then 3 mins at 15% with noted fatigue and slight abduction on LLE noted with manual assist for alignment throughout however pt able to maintain activation and reciprocal mobility. Pt directed in B hip adduction with ball squeezes with mm tapping on LLE for improved activation with good effect, 2x10; 2x10 hip abduction with manual resistance on RLE and intermittent mm tapping on LLE for  improved activation good effect. Pt also directed in x5 STS in steady with min A to achieve standing in device with use of RUE to assist in ascending to standing, manual facilitation at shoulder and hips for trunk extension, mm tapping throughout for activation, VC for glute activation and positioning as well as posture. Pt fatigued with each stand and required rest breaks however very motivated to continue. Pt returned to room in [facility] for time, left in [facility], alarm belt set, All needs in reach and in good condition. Call light in hand.  Pt denied pain at start and end of session  Therapy Documentation Precautions:  Precautions Precautions: Fall Precaution Comments: Left side weakness. Inattention to L Restrictions Weight Bearing Restrictions: No       Therapy/Group: Individual Therapy  Barbaraann Faster 07/29/2020, 9:44 AM

## 2020-07-29 NOTE — Progress Notes (Signed)
Occupational Therapy Session Note  Patient Details  Name: Nicole [name] MRN: [id number] Date of Birth: [birth year]  Today's Date: 07/29/2020 OT Individual Time: 4034-7425 OT Individual Time Calculation (min): 72 min    Short Term Goals: Week 1:  OT Short Term Goal 1 (Week 1): Pt will complete UB dressing with min A OT Short Term Goal 2 (Week 1): Pt will recall hemi dressing techniques with no more than min cueing OT Short Term Goal 3 (Week 1): Pt will increase functional activity tolerance by remaining OOB for 30 min OT Short Term Goal 4 (Week 1): Pt will maintain static sitting balance with min A during ADL task  Skilled Therapeutic Interventions/Progress Updates:    Pt resting in w/c upon arrival.  Transfers with Stedy at min a. Sitting balance EOM with CGA/supervision. Verbal and tactile cues for anterior pelvic tilt when sitting. OT intervention with focus on LUE weight bearing to facilitate improved function, shoulder flexion/extension and horizontal adduction with gravity eliminated, elbow flexion/extension with gravity eliminated. Pt with improved L finger flexion and trace extension. Improved L elbow flexion and extension. NMES per below for biceps and wrist/finger extension  1:1 NMES applied to L biceps to facilitate elbow flexion Ratio 1:3 Rate 35 pps Waveform- Asymmetric Ramp 1.0 Pulse 300 Intensity- 13 Duration -  10   No adverse reactions after treatment and is skin intact.   1:1 NMES applied to L wrist and finger extensors Ratio 1:3 Rate 35 pps Waveform- Asymmetric Ramp 1.0 Pulse 300 Intensity- 21 Duration -  10 mins    No adverse reactions after treatment and is skin intact.   Improved function noted after NMES  Pt returned to room and remained in w/c with half lap tray in place, belt alarm activated, and all needs within reach.     Therapy Documentation Precautions:  Precautions Precautions: Fall Precaution Comments: Left side weakness. Inattention to  L Restrictions Weight Bearing Restrictions: No  Pain:  Pt denies pain but c/o LUE "cold"   Therapy/Group: Individual Therapy  Rich Brave 07/29/2020, 11:13 AM

## 2020-07-30 ENCOUNTER — Inpatient Hospital Stay (HOSPITAL_COMMUNITY): Payer: Medicare Other | Admitting: Physical Therapy

## 2020-07-30 ENCOUNTER — Inpatient Hospital Stay (HOSPITAL_COMMUNITY): Payer: Medicare Other | Admitting: Occupational Therapy

## 2020-07-30 ENCOUNTER — Inpatient Hospital Stay (HOSPITAL_COMMUNITY): Payer: Medicare Other

## 2020-07-30 DIAGNOSIS — I10 Essential (primary) hypertension: Secondary | ICD-10-CM

## 2020-07-30 DIAGNOSIS — K5901 Slow transit constipation: Secondary | ICD-10-CM

## 2020-07-30 NOTE — Progress Notes (Signed)
Physical Therapy Session Note  Patient Details  Name: Nicole [name] MRN: [id number] Date of Birth: February 23, 1930  Today's Date: 07/30/2020 PT Individual Time: 0730-0828 PT Individual Time Calculation (min): 58 min   Short Term Goals: Week 1:  PT Short Term Goal 1 (Week 1): Pt will maintain static sitting balance x 5 min with min A PT Short Term Goal 2 (Week 1): Pt will perform least restrictive transfer with assist x 1 PT Short Term Goal 3 (Week 1): Pt will initiate w/c mobility PT Short Term Goal 4 (Week 1): Pt will initiate gait training as safe and able  Skilled Therapeutic Interventions/Progress Updates:    Pt received seated in bed, agreeable to PT session. No complaints of pain this date. Assisted pt with donning TED hose and socks at bed level dependently. Supine to sit with mod A for LLE management and some trunk control. Pt exhibits improved sitting balance EOB with close SBA to occasional min A needed due to R lateral lean. Assisted pt with donning pants dependently at bedside. Sit to stand with min A to stedy, assisted pt with pulling pants up over hips. Pt is max A to doff gown and don bra and shirt while seated EOB. Stedy transfer to w/c. Dependent w/c transfer to/from therapy gym for time conservation. Sit to stand x 5 reps in // bars with mod to max A, L knee blocked. Focus on activation of LLE musculature in standing with multimodal cueing for activation. Pt initially exhibits decreased weight shift onto L side with rotated trunk, with use of mirror for visual feedback pt exhibits improved standing posture in midline. Standing lateral weight shift L/R with mod A and manual cueing. Pt fatigues quickly with standing and exhibits decreased ability to maintain upright posture in midline with onset of fatigue. Pt left seated in w/c in room with needs in reach, L lap tray in place, quick release belt and chair alarm in place at end of session.  Therapy Documentation Precautions:   Precautions Precautions: Fall Precaution Comments: Left side weakness. Inattention to L Restrictions Weight Bearing Restrictions: No    Therapy/Group: Individual Therapy   Peter Congo, PT, DPT  07/30/2020, 8:29 AM

## 2020-07-30 NOTE — Progress Notes (Signed)
Occupational Therapy Session Note  Patient Details  Name: Nicole [name] MRN: [id number] Date of Birth: 09-27-1930  Today's Date: 07/30/2020 OT Individual Time: 2683-4196 OT Individual Time Calculation (min): 71 min   Short Term Goals: Week 1:  OT Short Term Goal 1 (Week 1): Pt will complete UB dressing with min A OT Short Term Goal 2 (Week 1): Pt will recall hemi dressing techniques with no more than min cueing OT Short Term Goal 3 (Week 1): Pt will increase functional activity tolerance by remaining OOB for 30 min OT Short Term Goal 4 (Week 1): Pt will maintain static sitting balance with min A during ADL task  Skilled Therapeutic Interventions/Progress Updates:    Pt greeted in the w/c with her toiletry bag on the half lap tray, requesting to "take a bath" during session. Sit<stand in [place] completed with Min A and then pt transferred to the 3:1 placed in shower. Pt relied on the arm support of the 3:1 for sitting balance as she leaned to the Lt, propped her Lt elbow up on bar and facilitated HOH to use the affected limb to wash her Rt side. Modified figure 4 for washing both feet as pt was unable to tolerate full figure 4 position due to discomfort/pain. Dressing was then completed w/c level sit<stand without AD, Mod A for sit<stand and static standing when OT was sitting in front of her. 2nd helper at this time assisted with LB dressing tasks. Pt required Total A for Teds and gripper socks. While sitting at the sink pt completed oral care and hand washing with HOH to lift Lt arm up onto the sink and also to use limb as a gross stabilizer as needed. OT performed gentle stretching to Lt shoulder, elbow, wrist, and digts afterwards. Pt stated the shoulder external rotation stretch particularly "felt good." Taught pt some gentle self ROM of the hand as well. At end of session pt was escorted to dance group. Post participation, she was taken back to the room by one of the nursing students.    Therapy  Documentation Precautions:  Precautions Precautions: Fall Precaution Comments: Left side weakness. Inattention to L Restrictions Weight Bearing Restrictions: No Pain: pt denied pain during tx   ADL: ADL Eating: Set up Where Assessed-Eating: Bed level Grooming: Minimal assistance Where Assessed-Grooming: Bed level Upper Body Bathing: Moderate cueing, Moderate assistance Where Assessed-Upper Body Bathing: Edge of bed Lower Body Bathing: Moderate assistance, Moderate cueing Where Assessed-Lower Body Bathing: Bed level Upper Body Dressing: Moderate cueing, Moderate assistance Where Assessed-Upper Body Dressing: Edge of bed Lower Body Dressing: Dependent Where Assessed-Lower Body Dressing: Bed level Toileting: Unable to assess Toilet Transfer: Unable to assess Toilet Transfer Method: Unable to assess      Therapy/Group: Individual Therapy  Timm Bonenberger A Anup Brigham 07/30/2020, 12:32 PM

## 2020-07-30 NOTE — Progress Notes (Signed)
[place] PHYSICAL MEDICINE & REHABILITATION PROGRESS NOTE   Subjective/Complaints:  Lying in bed comfortable. Denies any new problems. Moved bowels yesterday  ROS: Patient denies fever, rash, sore throat, blurred vision, nausea, vomiting, diarrhea, cough, shortness of breath or chest pain, joint or back pain, headache, or mood change.     Objective:   No results found. No results for input(s): WBC, HGB, HCT, PLT in the last 72 hours. Recent Labs    07/29/20 1251  NA 141  K 3.4*  CL 103  CO2 26  GLUCOSE 91  BUN 17  CREATININE 0.90  CALCIUM 9.9    Intake/Output Summary (Last 24 hours) at 07/30/2020 1006 Last data filed at 07/30/2020 0743 Gross per 24 hour  Intake 478 ml  Output --  Net 478 ml        Physical Exam: Vital Signs Blood pressure 129/80, pulse 72, temperature 98.9 F (37.2 C), temperature source Oral, resp. rate 14, height 5\' 5"  (1.651 m), weight 66.6 kg, SpO2 98 %. Physical Exam Constitutional: No distress . Vital signs reviewed. HEENT: EOMI, oral membranes moist Neck: supple Cardiovascular: RRR without murmur. No JVD    Respiratory/Chest: CTA Bilaterally without wheezes or rales. Normal effort    GI/Abdomen: BS +, non-tender, non-distended Ext: no clubbing, cyanosis, or edema Psych: pleasant and cooperative  Musculoskeletal:     Cervical back: Normal range of motion. No rigidity.     Comments: RUE/RLE- 5-/5 in all muscles tested LUE and LLE- 2 to 2-/5 in biceps, triceps, WE, grip, HF, KE, DF and PF - improved from admission Skin:    General: Skin is warm and dry.     Comments: IV L antecubital fossa- looks good   Left heel sl boggy Neurological: Alert;  fair awareness of her deficits. Decreased sensation to light touch in LUE/LLE- facial sensation normal per pt       Assessment/Plan: 1. 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  Physiatrist is providing close team supervision and 24 hour management of active medical problems listed below.  Physiatrist and rehab team continue to assess barriers to discharge/monitor patient progress toward functional and medical goals  Care Tool:  Bathing    Body parts bathed by patient: Chest, Abdomen, Left arm, Face   Body parts bathed by helper: Right arm, Front perineal area, Buttocks, Right upper leg, Left upper leg, Right lower leg, Left lower leg     Bathing assist Assist Level: Maximal Assistance - Patient 24 - 49%     Upper Body Dressing/Undressing Upper body dressing   What is the patient wearing?: Bra, Pull over shirt    Upper body assist Assist Level: Maximal Assistance - Patient 25 - 49%    Lower Body Dressing/Undressing Lower body dressing      What is the patient wearing?: Incontinence brief, Pants     Lower body assist Assist for lower body dressing: Total Assistance - Patient < 25%     Toileting Toileting Toileting Activity did not occur (Clothing management and hygiene only): N/A (no void or bm)  Toileting assist Assist for toileting: Moderate Assistance - Patient 50 - 74%     Transfers Chair/bed transfer  Transfers assist  Chair/bed transfer activity did not occur: Refused  Chair/bed transfer assist level: Dependent - mechanical lift (stedy)     Locomotion Ambulation   Ambulation assist   Ambulation activity did  not occur: Safety/medical concerns          Walk 10 feet activity   Assist  Walk 10 feet activity did not occur: Safety/medical concerns        Walk 50 feet activity   Assist Walk 50 feet with 2 turns activity did not occur: Safety/medical concerns         Walk 150 feet activity   Assist Walk 150 feet activity did not occur: Safety/medical concerns         Walk 10 feet on uneven surface  activity   Assist Walk 10 feet on uneven surfaces activity did not occur:  Safety/medical concerns         Wheelchair     Assist Will patient use wheelchair at discharge?: Yes Type of Wheelchair: Manual Wheelchair activity did not occur: Safety/medical concerns         Wheelchair 50 feet with 2 turns activity    Assist    Wheelchair 50 feet with 2 turns activity did not occur: Safety/medical concerns       Wheelchair 150 feet activity     Assist  Wheelchair 150 feet activity did not occur: Safety/medical concerns       Blood pressure 129/80, pulse 72, temperature 98.9 F (37.2 C), temperature source Oral, resp. rate 14, height 5\' 5"  (1.651 m), weight 66.6 kg, SpO2 98 %.  Medical Problem List and Plan: 1.  Left-sided weakness/almost hemiplegia secondary to right basal ganglia and corona radiata infarct             -patient may  shower             -ELOS/Goals: 14-18 days- goals CGA- mod I  2.  Antithrombotics: -DVT/anticoagulation: SCDs             -antiplatelet therapy: Aspirin 81 mg daily Plavix 85 mg daily and BMS investigational med 3. Pain Management: Tramadol as needed  9/22- still c/o L knee pain, but will con't Tramadol 50 mg q6 hours prn for now.  9/24- less pain complaints- con't meds  4. Mood: Provide emotional support             -antipsychotic agents: N/A 5. Neuropsych: This patient is capable of making decisions on her own behalf. 6. Skin/Wound Care: Routine skin checks 7. Fluids/Electrolytes/Nutrition: Routine in and outs with follow-up chemistries  9/22- labs look good on admission 8.  Hyperlipidemia.  Lipitor 9.  Hypothyroidism.  Synthroid 10.  Asthma.  Continue inhaler as needed 11.  Overactive bladder.  Ditropan 12.  Constipation.  MiraLAX twice daily as well as Senokot 2 tablets twice daily.  9/25 had results with sorbitol yesterday, feels better today 13.Glaucoma.pt says she doesn't take drops and doesn't have this dx.   9/22- will check with family.  14. L foot drop- will order PRAFO.  9/22- has been  ordered- not here yet.  9/23- didn't see in room- will check into   15. HTN-  9/23- BP 170s/70s this AM- hadn't been this high- is somewhat variable- if continues to stay high, will change BP meds   9/25 bp's improved 16. Poor appetite  9/23- will check CMP in AM- to see total protein and albumin- and check a prealbumin- I'M concerned she's eating <25% of meals (so far only seen breakfast).   9/24- pt's prealbumin is 30 (18-38), Albumin is good at 3.9 and total protein good at 8.5- con't regimen 17. Hypokalemia  9/24- K+ 3.4- will replete and recheck Monday.  LOS: 4 days A FACE TO FACE EVALUATION WAS PERFORMED  Ranelle Oyster 07/30/2020, 10:06 AM

## 2020-07-30 NOTE — Plan of Care (Signed)
  Problem: Consults Goal: RH STROKE PATIENT EDUCATION Description: See Patient Education module for education specifics  Outcome: Progressing   Problem: RH BOWEL ELIMINATION Goal: RH STG MANAGE BOWEL WITH ASSISTANCE Description: STG Manage Bowel with Min Assistance. Outcome: Progressing Goal: RH STG MANAGE BOWEL W/MEDICATION W/ASSISTANCE Description: STG Manage Bowel with Medication with Min Assistance. Outcome: Progressing   Problem: RH BLADDER ELIMINATION Goal: RH STG MANAGE BLADDER WITH ASSISTANCE Description: STG Manage Bladder With Min Assistance Outcome: Progressing   Problem: RH SKIN INTEGRITY Goal: RH STG MAINTAIN SKIN INTEGRITY WITH ASSISTANCE Description: STG Maintain Skin Integrity With Min Assistance. Outcome: Progressing   Problem: RH SAFETY Goal: RH STG ADHERE TO SAFETY PRECAUTIONS W/ASSISTANCE/DEVICE Description: STG Adhere to Safety Precautions With Min Assistance and appropriate assistive Device. Outcome: Progressing   Problem: RH PAIN MANAGEMENT Goal: RH STG PAIN MANAGED AT OR BELOW PT'S PAIN GOAL Description: <3 on a 0-10 pain scale. Outcome: Progressing   Problem: RH KNOWLEDGE DEFICIT Goal: RH STG INCREASE KNOWLEDGE OF HYPERTENSION Description: Patient will be able to demonstrate knowledge of HTN medications, dietary restrictions, BP parameters, and follow up care with the MD with Min assist from CIR staff at discharge. Outcome: Progressing Goal: RH STG INCREASE KNOWLEGDE OF HYPERLIPIDEMIA Description: Patient will be able to demonstrate knowledge of HLD medications, diet and exercise goals, and follow up care with the MD with Min assist from CIR staff at discharge. Outcome: Progressing Goal: RH STG INCREASE KNOWLEDGE OF STROKE PROPHYLAXIS Description: Patient will be able to demonstrate knowledge of secondary medications used to prevent future strokes with min assist from CIR staff. Outcome: Progressing

## 2020-07-30 NOTE — Progress Notes (Signed)
Speech Language Pathology Daily Session Note  Patient Details  Name: Nicole [name] MRN: [id number] Date of Birth: 06-29-1930  Today's Date: 07/30/2020 SLP Individual Time: 0321-2248 SLP Individual Time Calculation (min): 40 min  Short Term Goals: Week 1: SLP Short Term Goal 1 (Week 1): Pt will utilize external memory aids to recall new, daily information with Min A verbal cues. SLP Short Term Goal 2 (Week 1): Pt will attend to left field of environement during functional tasks with mod A verbal cues. SLP Short Term Goal 3 (Week 1): Pt will demonstrate functional problem solving for familiar mildly complex tasks with mod A verbal cues. SLP Short Term Goal 4 (Week 1): Pt will demonstrate  intellectual awareness by identifying 2 cognitive and 2 physical impairments with mod A verbal and visual cues.  Skilled Therapeutic Interventions:Skilled ST services focused on cognitive skills. SLP provided hearing amplifier system, due to hearing aids not being present in room. SLP facilitated reassessment of cognitive skills utilizing amplifier in money management task targeted last session, pt demonstrated ability to count change, display requested change and make simple change mod I. Pt required supervision A verbal cues for functional problem solving utilizing ALFA medication subsection, with ability to read medication labels and place pilled in the correct time per day on a pill chart. Pt demonstrated general recall of medications consumed prior to admission and did not use pill organizer. SLP educated pt on current medication, very similar to medication consumed at home, therefore SLP suggest pt continue preferred method of taking pills directly out of the bottle. Pt demonstrated recall of medication name/function/times per day given list with supervision A verbal cues. Pt demonstrated verbal problem solving times per day with personal medication and medication on ALFA with supervision A verbal cues. Pt was left  in room with call bell within reach and chair alarm set. SLP recommends to continue skilled services.     Pain Pain Assessment Pain Score: 0-No pain  Therapy/Group: Individual Therapy  Nicole [name]  [facility] 07/30/2020, 3:26 PM

## 2020-07-31 MED ORDER — DICLOFENAC SODIUM 1 % EX GEL
2.0000 g | Freq: Three times a day (TID) | CUTANEOUS | Status: DC
  Administered 2020-07-31 – 2020-08-02 (×7): 2 g via TOPICAL
  Filled 2020-07-31: qty 100

## 2020-07-31 NOTE — Progress Notes (Addendum)
[facility] PHYSICAL MEDICINE & REHABILITATION PROGRESS NOTE   Subjective/Complaints:  C/o pain in left knee. Had pain in this knee PTA. Otherwise doing ok  ROS: Patient denies fever, rash, sore throat, blurred vision, nausea, vomiting, diarrhea, cough, shortness of breath or chest pain, headache, or mood change.     Objective:   No results found. No results for input(s): WBC, HGB, HCT, PLT in the last 72 hours. Recent Labs    07/29/20 1251  NA 141  K 3.4*  CL 103  CO2 26  GLUCOSE 91  BUN 17  CREATININE 0.90  CALCIUM 9.9    Intake/Output Summary (Last 24 hours) at 07/31/2020 0943 Last data filed at 07/31/2020 0758 Gross per 24 hour  Intake 418 ml  Output --  Net 418 ml        Physical Exam: Vital Signs Blood pressure (!) 162/79, pulse 78, temperature 98.8 F (37.1 C), temperature source Oral, resp. rate 14, height 5\' 5"  (1.651 m), weight 66.6 kg, SpO2 98 %. Physical Exam Constitutional: No distress . Vital signs reviewed. HEENT: EOMI, oral membranes moist Neck: supple Cardiovascular: RRR without murmur. No JVD    Respiratory/Chest: CTA Bilaterally without wheezes or rales. Normal effort    GI/Abdomen: BS +, non-tender, non-distended Ext: no clubbing, cyanosis, or edema Psych: pleasant and cooperative Musculoskeletal:     Cervical back: left knee tender along medial jt line, pain with PROM, mild crepitus    Comments: RUE/RLE- 5-/5 in all muscles tested LUE and LLE- 2 to 2-/5 in biceps, triceps, WE, grip, HF, KE, DF and PF -limited by LLE pain Skin:    General: Skin is warm and dry.     Comments: IV L antecubital fossa- looks good   Left heel not visualized today Neurological: Alert;  fair awareness of her deficits. Decreased sensation to light touch in LUE/LLE- facial sensation normal per pt       Assessment/Plan: 1. 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  Physiatrist is providing close team supervision and 24 hour management of active medical problems listed below.  Physiatrist and rehab team continue to assess barriers to discharge/monitor patient progress toward functional and medical goals  Care Tool:  Bathing    Body parts bathed by patient: Chest, Abdomen, Front perineal area, Face, Right upper leg, Left upper leg   Body parts bathed by helper: Right arm, Left arm, Buttocks, Right lower leg, Left lower leg     Bathing assist Assist Level: Moderate Assistance - Patient 50 - 74%     Upper Body Dressing/Undressing Upper body dressing   What is the patient wearing?: Bra, Pull over shirt    Upper body assist Assist Level: Maximal Assistance - Patient 25 - 49%    Lower Body Dressing/Undressing Lower body dressing      What is the patient wearing?: Incontinence brief, Pants     Lower body assist Assist for lower body dressing: 2 Helpers     Toileting Toileting Toileting Activity did not occur (Clothing management and hygiene only): N/A (no void or bm)  Toileting assist Assist for toileting: Moderate Assistance - Patient 50 - 74%     Transfers Chair/bed transfer  Transfers assist  Chair/bed transfer activity did not occur: Refused  Chair/bed transfer assist level: Dependent - mechanical lift (stedy)     Locomotion Ambulation   Ambulation assist   Ambulation activity  did not occur: Safety/medical concerns          Walk 10 feet activity   Assist  Walk 10 feet activity did not occur: Safety/medical concerns        Walk 50 feet activity   Assist Walk 50 feet with 2 turns activity did not occur: Safety/medical concerns         Walk 150 feet activity   Assist Walk 150 feet activity did not occur: Safety/medical concerns         Walk 10 feet on uneven surface  activity   Assist Walk 10 feet on uneven surfaces activity did not occur: Safety/medical  concerns         Wheelchair     Assist Will patient use wheelchair at discharge?: Yes Type of Wheelchair: Manual Wheelchair activity did not occur: Safety/medical concerns         Wheelchair 50 feet with 2 turns activity    Assist    Wheelchair 50 feet with 2 turns activity did not occur: Safety/medical concerns       Wheelchair 150 feet activity     Assist  Wheelchair 150 feet activity did not occur: Safety/medical concerns       Blood pressure (!) 162/79, pulse 78, temperature 98.8 F (37.1 C), temperature source Oral, resp. rate 14, height 5\' 5"  (1.651 m), weight 66.6 kg, SpO2 98 %.  Medical Problem List and Plan: 1.  Left-sided weakness/almost hemiplegia secondary to right basal ganglia and corona radiata infarct             -patient may  shower             -ELOS/Goals: 14-18 days- goals CGA- mod I  2.  Antithrombotics: -DVT/anticoagulation: SCDs             -antiplatelet therapy: Aspirin 81 mg daily Plavix 85 mg daily and BMS investigational med 3. Pain Management: Tramadol as needed  9/22- still c/o L knee pain, but will con't Tramadol 50 mg q6 hours prn for now.  9/26- add voltaren gel to knee, use ice also 4. Mood: Provide emotional support             -antipsychotic agents: N/A 5. Neuropsych: This patient is capable of making decisions on her own behalf. 6. Skin/Wound Care: Routine skin checks 7. Fluids/Electrolytes/Nutrition: Routine in and outs with follow-up chemistries  9/22- labs look good on admission 8.  Hyperlipidemia.  Lipitor 9.  Hypothyroidism.  Synthroid 10.  Asthma.  Continue inhaler as needed 11.  Overactive bladder.  Ditropan 12.  Constipation.  MiraLAX twice daily as well as Senokot 2 tablets twice daily.  9/26 had results with sorbitol, meds. Stools now loose   -hold miralax for now 13.Glaucoma.pt says she doesn't take drops and doesn't have this dx.   9/22- will check with family.  14. L foot drop- will order  PRAFO.  9/22- has been ordered- not here yet.  9/23- didn't see in room- will check into   15. HTN-  9/23- BP 170s/70s this AM- hadn't been this high- is somewhat variable- if continues to stay high, will change BP meds   9/25 bp's improved 16. Poor appetite  9/23- will check CMP in AM- to see total protein and albumin- and check a prealbumin- I'M concerned she's eating <25% of meals (so far only seen breakfast).   9/24- pt's prealbumin is 30 (18-38), Albumin is good at 3.9 and total protein good at 8.5- con't regimen 17. Hypokalemia  9/24- K+ 3.4- will replete and recheck Monday.     LOS: 5 days A FACE TO FACE EVALUATION WAS PERFORMED  Ranelle Oyster 07/31/2020, 9:43 AM

## 2020-08-01 ENCOUNTER — Inpatient Hospital Stay (HOSPITAL_COMMUNITY): Payer: Medicare Other | Admitting: Physical Therapy

## 2020-08-01 ENCOUNTER — Inpatient Hospital Stay (HOSPITAL_COMMUNITY): Payer: Medicare Other

## 2020-08-01 ENCOUNTER — Inpatient Hospital Stay (HOSPITAL_COMMUNITY): Payer: Medicare Other | Admitting: Occupational Therapy

## 2020-08-01 LAB — BASIC METABOLIC PANEL
Anion gap: 6 (ref 5–15)
BUN: 16 mg/dL (ref 8–23)
CO2: 25 mmol/L (ref 22–32)
Calcium: 9.4 mg/dL (ref 8.9–10.3)
Chloride: 109 mmol/L (ref 98–111)
Creatinine, Ser: 0.94 mg/dL (ref 0.44–1.00)
GFR calc Af Amer: >60 mL/min (ref >60)
GFR calc non Af Amer: 54 mL/min — ABNORMAL LOW (ref >60)
Glucose, Bld: 96 mg/dL (ref 70–99)
Potassium: 3.9 mmol/L (ref 3.5–5.1)
Sodium: 140 mmol/L (ref 135–145)

## 2020-08-01 LAB — CBC WITH DIFFERENTIAL/PLATELET
Abs Immature Granulocytes: 0.03 10*3/uL (ref 0.00–0.07)
Basophils Absolute: 0.1 10*3/uL (ref 0.0–0.1)
Basophils Relative: 1 %
Eosinophils Absolute: 0.2 10*3/uL (ref 0.0–0.5)
Eosinophils Relative: 3 %
HCT: 38.3 % (ref 36.0–46.0)
Hemoglobin: 12.1 g/dL (ref 12.0–15.0)
Immature Granulocytes: 0 %
Lymphocytes Relative: 31 %
Lymphs Abs: 2.7 10*3/uL (ref 0.7–4.0)
MCH: 30.8 pg (ref 26.0–34.0)
MCHC: 31.6 g/dL (ref 30.0–36.0)
MCV: 97.5 fL (ref 80.0–100.0)
Monocytes Absolute: 0.7 10*3/uL (ref 0.1–1.0)
Monocytes Relative: 8 %
Neutro Abs: 4.8 10*3/uL (ref 1.7–7.7)
Neutrophils Relative %: 57 %
Platelets: 270 10*3/uL (ref 150–400)
RBC: 3.93 MIL/uL (ref 3.87–5.11)
RDW: 12.3 % (ref 11.5–15.5)
WBC: 8.5 10*3/uL (ref 4.0–10.5)
nRBC: 0 % (ref 0.0–0.2)

## 2020-08-01 NOTE — Progress Notes (Signed)
Physical Therapy Session Note  Patient Details  Name: Nicole [name] MRN: [id number] Date of Birth: 1929/12/19  Today's Date: 08/01/2020 PT Individual Time: 1600-1700 PT Individual Time Calculation (min): 60 min   Short Term Goals: Week 1:  PT Short Term Goal 1 (Week 1): Pt will maintain static sitting balance x 5 min with min A PT Short Term Goal 2 (Week 1): Pt will perform least restrictive transfer with assist x 1 PT Short Term Goal 3 (Week 1): Pt will initiate w/c mobility PT Short Term Goal 4 (Week 1): Pt will initiate gait training as safe and able  Skilled Therapeutic Interventions/Progress Updates:    Pt received supine in bed, agreeable to PT session. No complaints of pain. Supine to sit with max A for BLE management and trunk control. Assisted pt with donning pants while seated EOB. Min A for sit to stand to stedy. Stedy transfer to w/c. Dependent transport via w/c to/from therapy gym for time conservation. Sit to stand in // bars with min to mod A. Standing alt L/R forward stepping with max A for standing balance. Pt exhibits decreased WBing on LLE in standing. Ambulation 2 x 10 ft in // bars with mod A for balance and close w/c follow for safety. Pt requires assist to advance LLE as well as manual assist for weight shift onto LLE in order to pick up RLE to take a step. Pt initially just scoots RLE due to fear of WBing on LLE, able to progress to taking a small step with RLE. Squat pivot transfer w/c to/from mat table with mod to max A. Pt does better transferring to R side into chair and L side out of chair. Pt very fatigued and requesting to return to bed at end of session. Squat pivot transfer back to bed with max A. Sit to supine mod A for BLE management. Pt left supine in bed with needs in reach, bed alarm in place at end of session.  Therapy Documentation Precautions:  Precautions Precautions: Fall Precaution Comments: Left side weakness. Inattention to L Restrictions Weight  Bearing Restrictions: No    Therapy/Group: Individual Therapy   Peter Congo, PT, DPT  08/01/2020, 5:16 PM

## 2020-08-01 NOTE — Progress Notes (Addendum)
Speech Language Pathology Daily Session Note  Patient Details  Name: Nicole [name] MRN: [id number] Date of Birth: January 25, 1930  Today's Date: 08/01/2020 SLP Individual Time: 1245-8099 SLP Individual Time Calculation (min): 40 min  Short Term Goals: Week 1: SLP Short Term Goal 1 (Week 1): Pt will utilize external memory aids to recall new, daily information with Min A verbal cues. SLP Short Term Goal 2 (Week 1): Pt will attend to left field of environement during functional tasks with mod A verbal cues. SLP Short Term Goal 3 (Week 1): Pt will demonstrate functional problem solving for familiar mildly complex tasks with mod A verbal cues. SLP Short Term Goal 4 (Week 1): Pt will demonstrate  intellectual awareness by identifying 2 cognitive and 2 physical impairments with mod A verbal and visual cues.  Skilled Therapeutic Interventions:Skilled ST services focused on cognitive skills. Pt expressed fatigue from am therapy sessions, but was agreeable to participate in skilled ST services. Pt demonstrated recall of Saturday ST events, money and medication management mod I. SLP facilitated complex problem solving and error awareness skills in checkbook balancing task, pt required supervision A verbal cues error awareness when organizing amounts. Pt states daughter balances checkbook after pt records checks in chronological order. Pt requested to get into bed and end ST services due to nausea. SLP and NT assisted with transfer from [facility] to bed with stedy. SLP notified nurse of pt's request for nausea medication. Pt missed 20 minutes of skilled ST services. Pt was left in room with call bell within reach and bed alarm set. SLP upgraded complex problem solving goal to supervision due to fast progress and will focus on time management in upcoming sessions. SLP recommends to continue skilled services.     Pain Pain Assessment Pain Scale: 0-10 Pain Score: 0-No pain  Therapy/Group: Individual Therapy  Zavian Slowey   [facility] 08/01/2020, 11:52 AM

## 2020-08-01 NOTE — Progress Notes (Signed)
Physical Therapy Session Note  Patient Details  Name: Nicole [name] MRN: [id number] Date of Birth: 05/08/1930  Today's Date: 08/01/2020 PT Individual Time: 1300-1347 PT Individual Time Calculation (min): 47 min   Short Term Goals: Week 1:  PT Short Term Goal 1 (Week 1): Pt will maintain static sitting balance x 5 min with min A PT Short Term Goal 2 (Week 1): Pt will perform least restrictive transfer with assist x 1 PT Short Term Goal 3 (Week 1): Pt will initiate w/c mobility PT Short Term Goal 4 (Week 1): Pt will initiate gait training as safe and able  Skilled Therapeutic Interventions/Progress Updates:    Patient received supine in bed agreeable to PT. She denies pain, but endorses fatigue and slight nausea. Patient able to transition from supine to sitting edge of bed with CGA-MinA for L LE management. Increased L knee flexion tone noted. MaxA provided for squat pivot to wc. PT pushed patient in wc to therapy gym for time management. STS x2 in // bars with ModA and BUE on bars. B knees blocked with emphasis on L knee. Excessive knee flexion maintained throughout stance, likely related to increase in hamstring tone and not necessarily quad weakness. Patient progressing to STS in // bars with lateral weight shifts. MinA + B knees blocked provided and tactile facilitation of weight shifts. Patient completing 5x1 min on Kinetron at 30cm/sec. She was able to transfer back to bed with MaxA for squat pivot. MinA provided for sit > supine. Bed alarm on, call light within reach.   Therapy Documentation Precautions:  Precautions Precautions: Fall Precaution Comments: Left side weakness. Inattention to L Restrictions Weight Bearing Restrictions: No    Therapy/Group: Individual Therapy  [name], PT, DPT, CBIS 08/01/2020, 7:50 AM

## 2020-08-01 NOTE — Progress Notes (Signed)
Occupational Therapy Session Note  Patient Details  Name: Nicole [name] MRN: [id number] Date of Birth: 07-Jul-1930  Today's Date: 08/01/2020 OT Individual Time: 0258-5277 OT Individual Time Calculation (min): 53 min    Short Term Goals: Week 1:  OT Short Term Goal 1 (Week 1): Pt will complete UB dressing with min A OT Short Term Goal 2 (Week 1): Pt will recall hemi dressing techniques with no more than min cueing OT Short Term Goal 3 (Week 1): Pt will increase functional activity tolerance by remaining OOB for 30 min OT Short Term Goal 4 (Week 1): Pt will maintain static sitting balance with min A during ADL task  Skilled Therapeutic Interventions/Progress Updates:    Pt greeted at time of session supine in bed resting agreeable to OT session, wanting to take a shower and get ready for the day. No c/o pain throughout. Supine to sit Mod to manage BLEs but min for trunk, Sit to stand Min from [place] with L hand placement on bar, some functional grip to assist. Transferred via Stedy to 3-in-1 in shower, able to help control descent. Assist with doffing nightgown and brief for time management, pt performed UB/LB bathing Mod overall with hand over hand assist to use LUE to wash RUE, L lateral lean present but decreased when placed LUE on BSC armrest for support. Encouraged use of LUE as a functional assist throughout entire session to hold body wash bottle, wash cloth, etc for NMR. Therapist assist to manage LLE for figure four for pt to wash, able to bring RLE up to self to wash, assist with washing back. Pt also trialed lateral leans on BSC with Min A for sitting balance to wash buttocks. Dried off in the same manner of assist, Stedy transfer back to bed, UB dress Mod with help for bra clasp and use of hemidressing techniques, pt recalling to dress L side first with min cues. Donned brief and pants with Max A, assist to thread and pt in static standing beginning with Mod and decreasing to Min A for static  standing while 2nd helper donned over hips. Sit to supine as pt very fatigued with Mod A, scoot up in bed with bridging. Positioned bed level with LUE supported. Alarm on, call bell in reach.      Therapy Documentation Precautions:  Precautions Precautions: Fall Precaution Comments: Left side weakness. Inattention to L Restrictions Weight Bearing Restrictions: No     Therapy/Group: Individual Therapy  Erasmo Score 08/01/2020, 9:32 AM

## 2020-08-01 NOTE — Progress Notes (Signed)
Occupational Therapy Session Note  Patient Details  Name: Nicole [name] MRN: [id number] Date of Birth: 08/12/1930  Today's Date: 08/01/2020 OT Individual Time: 6701-4103 OT Individual Time Calculation (min): 30 min    Short Term Goals: Week 1:  OT Short Term Goal 1 (Week 1): Pt will complete UB dressing with min A OT Short Term Goal 2 (Week 1): Pt will recall hemi dressing techniques with no more than min cueing OT Short Term Goal 3 (Week 1): Pt will increase functional activity tolerance by remaining OOB for 30 min OT Short Term Goal 4 (Week 1): Pt will maintain static sitting balance with min A during ADL task  Skilled Therapeutic Interventions/Progress Updates:    Pt resting in bed upon arrival and agreeable to getting OOB. Supine>sit with mod A. Squat pivot transfer with mod A. OT intervention with focus on BUE functional movement and strengthening.  Pt with slightly improved finger flexion and full extension. Trace biceps. Pt requires max facilitation for L scapula elevation/depression. Pt issued yellow foam block for L hand grasp. Pt remained in w/c with all needs within reach, half lap tray in place, and belt alarm activated.   Therapy Documentation Precautions:  Precautions Precautions: Fall Precaution Comments: Left side weakness. Inattention to L Restrictions Weight Bearing Restrictions: No  Pain: Pain Assessment Pain Scale: 0-10 Pain Score: 0-No pain  Therapy/Group: Individual Therapy  Rich Brave 08/01/2020, 10:47 AM

## 2020-08-01 NOTE — Plan of Care (Signed)
  Problem: RH Problem Solving Goal: LTG Patient will demonstrate problem solving for (SLP) Description: LTG:  Patient will demonstrate problem solving for basic/complex daily situations with cues  (SLP) Flowsheets (Taken 08/01/2020 1201) LTG Patient will demonstrate problem solving for: (upgraded due to progress) Supervision Note: Upgraded due to progress

## 2020-08-01 NOTE — Progress Notes (Signed)
PHYSICAL MEDICINE & REHABILITATION PROGRESS NOTE   Subjective/Complaints:  Pt doesn't c/o L knee this AM- got meds already.  "feels fine".   ROS:  Pt denies SOB, abd pain, CP, N/V/C/D, and vision changes   Objective:   No results found. Recent Labs    08/01/20 0627  WBC 8.5  HGB 12.1  HCT 38.3  PLT 270   Recent Labs    08/01/20 0627  NA 140  K 3.9  CL 109  CO2 25  GLUCOSE 96  BUN 16  CREATININE 0.94  CALCIUM 9.4    Intake/Output Summary (Last 24 hours) at 08/01/2020 2033 Last data filed at 08/01/2020 1853 Gross per 24 hour  Intake 477 ml  Output --  Net 477 ml        Physical Exam: Vital Signs Blood pressure (!) 142/65, pulse 79, temperature 98.3 F (36.8 C), resp. rate 16, height 5\' 5"  (1.651 m), weight 66.6 kg, SpO2 94 %. Physical Exam Constitutional: No distress . Vital signs reviewed.sitting up in bed; appropriate, NAD HEENT: EOMI, oral membranes moist Neck: supple Cardiovascular: RRR    Respiratory/Chest: CTA B/L- no W/R/R- good air movement GI/Abdomen: Soft, NT, ND, (+)BS  Ext: no clubbing, cyanosis, or edema Psych: pleasant and cooperative Musculoskeletal:     Cervical back: left knee tender along medial jt line, pain with PROM, mild crepitus    Comments: RUE/RLE- 5-/5 in all muscles tested LUE and LLE- 2+ /5 this AM biceps, triceps, WE, grip, still 2-/5 in LLE- HF, KE, DF and PF -limited by LLE pain Skin:    General: Skin is warm and dry.     Comments: IV L antecubital fossa- looks good   Left heel not visualized today Neurological: Alert;  fair awareness of her deficits. Decreased sensation to light touch in LUE/LLE- facial sensation normal per pt       Assessment/Plan: 1. 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  Physiatrist is providing close team supervision and 24 hour management of  active medical problems listed below.  Physiatrist and rehab team continue to assess barriers to discharge/monitor patient progress toward functional and medical goals  Care Tool:  Bathing    Body parts bathed by patient: Chest, Abdomen, Front perineal area, Face, Right upper leg, Left upper leg, Left arm, Right lower leg, Buttocks   Body parts bathed by helper: Right arm, Buttocks, Right lower leg, Left lower leg     Bathing assist Assist Level: Moderate Assistance - Patient 50 - 74%     Upper Body Dressing/Undressing Upper body dressing   What is the patient wearing?: Bra, Pull over shirt    Upper body assist Assist Level: Moderate Assistance - Patient 50 - 74%    Lower Body Dressing/Undressing Lower body dressing      What is the patient wearing?: Incontinence brief, Pants     Lower body assist Assist for lower body dressing: 2 Helpers     Toileting Toileting Toileting Activity did not occur (Clothing management and hygiene only): N/A (no void or bm)  Toileting assist Assist for toileting: Moderate Assistance - Patient 50 - 74%     Transfers Chair/bed transfer  Transfers assist  Chair/bed transfer activity did not occur: Refused  Chair/bed transfer assist level: Maximal Assistance - Patient 25 - 49%     Locomotion Ambulation   Ambulation assist   Ambulation activity  did not occur: Safety/medical concerns  Assist level: 2 helpers Assistive device: Parallel bars Max distance: 10'   Walk 10 feet activity   Assist  Walk 10 feet activity did not occur: Safety/medical concerns  Assist level: 2 helpers Assistive device: Parallel bars   Walk 50 feet activity   Assist Walk 50 feet with 2 turns activity did not occur: Safety/medical concerns         Walk 150 feet activity   Assist Walk 150 feet activity did not occur: Safety/medical concerns         Walk 10 feet on uneven surface  activity   Assist Walk 10 feet on uneven surfaces  activity did not occur: Safety/medical concerns         Wheelchair     Assist Will patient use wheelchair at discharge?: Yes Type of Wheelchair: Manual Wheelchair activity did not occur: Safety/medical concerns         Wheelchair 50 feet with 2 turns activity    Assist    Wheelchair 50 feet with 2 turns activity did not occur: Safety/medical concerns       Wheelchair 150 feet activity     Assist  Wheelchair 150 feet activity did not occur: Safety/medical concerns       Blood pressure (!) 142/65, pulse 79, temperature 98.3 F (36.8 C), resp. rate 16, height 5\' 5"  (1.651 m), weight 66.6 kg, SpO2 94 %.  Medical Problem List and Plan: 1.  Left-sided weakness/almost hemiplegia secondary to right basal ganglia and corona radiata infarct             -patient may  shower             -ELOS/Goals: 14-18 days- goals CGA- mod I   9/27- making strength exam gains every few days 2.  Antithrombotics: -DVT/anticoagulation: SCDs             -antiplatelet therapy: Aspirin 81 mg daily Plavix 85 mg daily and BMS investigational med 3. Pain Management: Tramadol as needed  9/22- still c/o L knee pain, but will con't Tramadol 50 mg q6 hours prn for now.  9/26- add voltaren gel to knee, use ice also 4. Mood: Provide emotional support             -antipsychotic agents: N/A 5. Neuropsych: This patient is capable of making decisions on her own behalf. 6. Skin/Wound Care: Routine skin checks 7. Fluids/Electrolytes/Nutrition: Routine in and outs with follow-up chemistries  9/22- labs look good on admission 8.  Hyperlipidemia.  Lipitor 9.  Hypothyroidism.  Synthroid 10.  Asthma.  Continue inhaler as needed 11.  Overactive bladder.  Ditropan 12.  Constipation.  MiraLAX twice daily as well as Senokot 2 tablets twice daily.  9/26 had results with sorbitol, meds. Stools now loose   -hold miralax for now 13.Glaucoma.pt says she doesn't take drops and doesn't have this dx.   9/22-  will check with family.  14. L foot drop- will order PRAFO.  9/22- has been ordered- not here yet.  9/23- didn't see in room- will check into   15. HTN-  9/23- BP 170s/70s this AM- hadn't been this high- is somewhat variable- if continues to stay high, will change BP meds   9/25 bp's improved  9/27- 142/65- doing better 16. Poor appetite  9/23- will check CMP in AM- to see total protein and albumin- and check a prealbumin- I'M concerned she's eating <25% of meals (so far only seen breakfast).   9/24- pt's  prealbumin is 30 (18-38), Albumin is good at 3.9 and total protein good at 8.5- con't regimen 17. Hypokalemia  9/24- K+ 3.4- will replete and recheck Monday.   9/27- K+ 3.9    LOS: 6 days A FACE TO FACE EVALUATION WAS PERFORMED  Nicole [name] 08/01/2020, 8:33 PM

## 2020-08-02 ENCOUNTER — Inpatient Hospital Stay (HOSPITAL_COMMUNITY): Payer: Medicare Other

## 2020-08-02 ENCOUNTER — Inpatient Hospital Stay (HOSPITAL_COMMUNITY): Payer: Medicare Other | Admitting: Occupational Therapy

## 2020-08-02 ENCOUNTER — Inpatient Hospital Stay (HOSPITAL_COMMUNITY): Payer: Medicare Other | Admitting: *Deleted

## 2020-08-02 ENCOUNTER — Inpatient Hospital Stay (HOSPITAL_COMMUNITY): Payer: Medicare Other | Admitting: Physical Therapy

## 2020-08-02 MED ORDER — ACETAMINOPHEN 325 MG PO TABS
650.0000 mg | ORAL_TABLET | Freq: Three times a day (TID) | ORAL | Status: DC
  Administered 2020-08-02 – 2020-08-17 (×45): 650 mg via ORAL
  Filled 2020-08-02 (×46): qty 2

## 2020-08-02 MED ORDER — ACETAMINOPHEN 650 MG RE SUPP
650.0000 mg | Freq: Three times a day (TID) | RECTAL | Status: DC
  Filled 2020-08-02 (×4): qty 1

## 2020-08-02 MED ORDER — ACETAMINOPHEN 160 MG/5ML PO SOLN
650.0000 mg | Freq: Three times a day (TID) | ORAL | Status: DC
  Filled 2020-08-02 (×5): qty 20.3

## 2020-08-02 MED ORDER — DICLOFENAC SODIUM 1 % EX GEL
2.0000 g | Freq: Four times a day (QID) | CUTANEOUS | Status: DC
  Administered 2020-08-02 – 2020-08-06 (×16): 2 g via TOPICAL
  Filled 2020-08-02: qty 100

## 2020-08-02 NOTE — Progress Notes (Signed)
Patient ID: Nicole [name], female   DOB: 03/03/1930, 86 y.o.   MRN: [id number]  SW met with pt and friend in room to provide updates from team conference, and d/c date 10/13. Pt aware SW to follow-up with dtr to discuss further.  SW spoke with pt dtr [name] ([phone]) to provide above updates. SW discussed family education; scheduled for Wed 10/6 8am until completed. She reports she is concerned about how pt will get into the home as there are 13-14 steps to get into the home. SW offered ambulance transport home if needed. SW reminded pt dtr that once she comes in for family education she will be able to see pt gains in rehab, and areas she will need help with. SW will continue to provide d/c recommendations.   Auria Chamberlain, MSW, LCSWA Office: [phone] Cell: [phone] Fax: [phone] 

## 2020-08-02 NOTE — Progress Notes (Signed)
[facility] PHYSICAL MEDICINE & REHABILITATION PROGRESS NOTE   Subjective/Complaints: Complains of left knee pain- tylenol helps, voltaren gel helps, has not been icing. Denies other complaints Labs excellent yesterday!  ROS:  Pt denies SOB, abd pain, CP, N/V/C/D, and vision changes   Objective:   No results found. Recent Labs    08/01/20 0627  WBC 8.5  HGB 12.1  HCT 38.3  PLT 270   Recent Labs    08/01/20 0627  NA 140  K 3.9  CL 109  CO2 25  GLUCOSE 96  BUN 16  CREATININE 0.94  CALCIUM 9.4    Intake/Output Summary (Last 24 hours) at 08/02/2020 1110 Last data filed at 08/02/2020 0900 Gross per 24 hour  Intake 477 ml  Output --  Net 477 ml        Physical Exam: Vital Signs Blood pressure (!) 147/57, pulse 66, temperature 98.4 F (36.9 C), temperature source Oral, resp. rate 12, height 5\' 5"  (1.651 m), weight 66.6 kg, SpO2 97 %. Physical Exam General: Alert and oriented x 3, No apparent distress HEENT: Head is normocephalic, atraumatic, PERRLA, EOMI, sclera anicteric, oral mucosa pink and moist, dentition intact, ext ear canals clear,  Neck: Supple without JVD or lymphadenopathy Heart: Reg rate and rhythm. No murmurs rubs or gallops Chest: CTA bilaterally without wheezes, rales, or rhonchi; no distress Abdomen: Soft, non-tender, non-distended, bowel sounds positive. Extremities: No clubbing, cyanosis, or edema. Pulses are 2+ Psych: pleasant and cooperative Musculoskeletal:     Cervical back: left knee tender along medial jt line, pain with PROM, mild crepitus    Comments: RUE/RLE- 5-/5 in all muscles tested LUE and LLE- 2+ /5 this AM biceps, triceps, WE, grip, still 2-/5 in LLE- HF, KE, DF and PF -limited by LLE pain Skin:    General: Skin is warm and dry.     Comments: IV L antecubital fossa- looks good   Left heel not visualized today Neurological: Alert;  fair awareness of her deficits. Decreased sensation to light touch in LUE/LLE- facial sensation  normal per pt        Assessment/Plan: 1. 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  Physiatrist is providing close team supervision and 24 hour management of active medical problems listed below.  Physiatrist and rehab team continue to assess barriers to discharge/monitor patient progress toward functional and medical goals  Care Tool:  Bathing    Body parts bathed by patient: Chest, Abdomen, Face, Right upper leg, Left upper leg, Left arm, Right lower leg, Buttocks   Body parts bathed by helper: Right arm, Buttocks, Right lower leg, Left lower leg, Front perineal area     Bathing assist Assist Level: Moderate Assistance - Patient 50 - 74%     Upper Body Dressing/Undressing Upper body dressing   What is the patient wearing?: Bra, Pull over shirt    Upper body assist Assist Level: Moderate Assistance - Patient 50 - 74%    Lower Body Dressing/Undressing Lower body dressing      What is the patient wearing?: Incontinence brief, Pants     Lower body assist Assist for lower body dressing: 2 Helpers     Toileting Toileting Toileting Activity did not occur (Clothing management and hygiene only): N/A (no void or bm)  Toileting assist Assist for toileting: Moderate Assistance - Patient 50 - 74%     Transfers Chair/bed transfer  Transfers assist  Chair/bed transfer activity did not occur: Refused  Chair/bed transfer assist level: Maximal Assistance - Patient 25 - 49%     Locomotion Ambulation   Ambulation assist   Ambulation activity did not occur: Safety/medical concerns  Assist level: 2 helpers Assistive device: Parallel bars Max distance: 10'   Walk 10 feet activity   Assist  Walk 10 feet activity did not occur: Safety/medical concerns  Assist level: 2 helpers Assistive device: Parallel bars   Walk 50 feet  activity   Assist Walk 50 feet with 2 turns activity did not occur: Safety/medical concerns         Walk 150 feet activity   Assist Walk 150 feet activity did not occur: Safety/medical concerns         Walk 10 feet on uneven surface  activity   Assist Walk 10 feet on uneven surfaces activity did not occur: Safety/medical concerns         Wheelchair     Assist Will patient use wheelchair at discharge?: Yes Type of Wheelchair: Manual Wheelchair activity did not occur: Safety/medical concerns         Wheelchair 50 feet with 2 turns activity    Assist    Wheelchair 50 feet with 2 turns activity did not occur: Safety/medical concerns       Wheelchair 150 feet activity     Assist  Wheelchair 150 feet activity did not occur: Safety/medical concerns       Blood pressure (!) 147/57, pulse 66, temperature 98.4 F (36.9 C), temperature source Oral, resp. rate 12, height 5\' 5"  (1.651 m), weight 66.6 kg, SpO2 97 %.  Medical Problem List and Plan: 1.  Left-sided weakness/almost hemiplegia secondary to right basal ganglia and corona radiata infarct             -patient may  shower             -ELOS/Goals: 14-18 days- goals CGA- mod I   9/27- making strength exam gains every few days  -Continue CIR 2.  Antithrombotics: -DVT/anticoagulation: SCDs             -antiplatelet therapy: Aspirin 81 mg daily Plavix 85 mg daily and BMS investigational med 3. Pain Management: Tramadol as needed  9/22- still c/o L knee pain, but will con't Tramadol 50 mg q6 hours prn for now.  9/26- add voltaren gel to knee, use ice also  9/28: Ordered ice TID 15 minutes, voltaren gel increased to QID, Tylenol scheduled to q8H.  4. Mood: Provide emotional support             -antipsychotic agents: N/A 5. Neuropsych: This patient is capable of making decisions on her own behalf. 6. Skin/Wound Care: Routine skin checks 7. Fluids/Electrolytes/Nutrition: Routine in and outs with  follow-up chemistries  9/22- labs look good on admission 8.  Hyperlipidemia.  Lipitor 80mg  daily 9.  Hypothyroidism.  Synthroid 10.  Asthma.  Continue inhaler as needed 11.  Overactive bladder.  Ditropan 12.  Constipation.  MiraLAX twice daily as well as Senokot 2 tablets twice daily.  9/26 had results with sorbitol, meds. Stools now loose   -hold miralax for now 13.Glaucoma.pt says she doesn't take drops and doesn't have this dx.   9/22- will check with family.  14. L foot drop- will order PRAFO.  9/22- has been ordered- not here yet.  9/23- didn't see in room- will check into   15. HTN-  9/23- BP 170s/70s this AM- hadn't  been this high- is somewhat variable- if continues to stay high, will change BP meds   9/25 bp's improved  9/27- 142/65- doing better  9/28: systolic is high, diastolic is low- continue to monitor 16. Poor appetite  9/23- will check CMP in AM- to see total protein and albumin- and check a prealbumin- I'M concerned she's eating <25% of meals (so far only seen breakfast).   9/24- pt's prealbumin is 30 (18-38), Albumin is good at 3.9 and total protein good at 8.5- con't regimen 17. Hypokalemia  9/24- K+ 3.4- will replete and recheck Monday.   9/27- K+ 3.9    LOS: 7 days A FACE TO FACE EVALUATION WAS PERFORMED  Deniqua Perry P Rajohn Henery 08/02/2020, 11:10 AM

## 2020-08-02 NOTE — Progress Notes (Signed)
Speech Language Pathology Daily Session Note  Patient Details  Name: Nicole [name] MRN: [id number] Date of Birth: Sep 08, 1930  Today's Date: 08/02/2020 SLP Individual Time: 1302-1330 SLP Individual Time Calculation (min): 28 min  Short Term Goals: Week 1: SLP Short Term Goal 1 (Week 1): Pt will utilize external memory aids to recall new, daily information with Min A verbal cues. SLP Short Term Goal 2 (Week 1): Pt will attend to left field of environement during functional tasks with mod A verbal cues. SLP Short Term Goal 3 (Week 1): Pt will demonstrate functional problem solving for familiar mildly complex tasks with mod A verbal cues. SLP Short Term Goal 4 (Week 1): Pt will demonstrate  intellectual awareness by identifying 2 cognitive and 2 physical impairments with mod A verbal and visual cues.  Skilled Therapeutic Interventions:  Skilled ST services focused on cognitive skills. SLP facilitated anticipatory awareness pertaining to impact of deficits and participation in ADLs and IADLs. Pt demonstrated mod I listing need for assistance from daughter in "walking", "bathing", "dressing" and "cooking." SLP instructed pt in visualization strategies to recall 4 novel related words, pt demonstrated delayed recall of 5 out 5 words. SLP facilitated mildly complex problem solving skills in functional time management task, pt only began task due to time restraints but required supervision A verbal cues thus far. Pt was left in room with call bell within reach and bed alarm set. SLP reduced ST services to x3 a week due to steady progress and educated OT/PT to utilize hearing amplifier to assist in carryover of complex commands. SLP recommends to continue skilled services.     Pain Pain Assessment Pain Score: 0-No pain  Therapy/Group: Individual Therapy  Jes Costales  [facility] 08/02/2020, 4:20 PM

## 2020-08-02 NOTE — Progress Notes (Signed)
Patient c/o increased knee pain last night; PRN tramadol ineffective, administered PRN tylenol and Voltaren.

## 2020-08-02 NOTE — Progress Notes (Signed)
Physical Therapy Session Note  Patient Details  Name: Nicole [name] MRN: [id number] Date of Birth: May 19, 1930  Today's Date: 08/02/2020 PT Individual Time: 1517-6160 PT Individual Time Calculation (min): 60 min   Short Term Goals: Week 1:  PT Short Term Goal 1 (Week 1): Pt will maintain static sitting balance x 5 min with min A PT Short Term Goal 2 (Week 1): Pt will perform least restrictive transfer with assist x 1 PT Short Term Goal 3 (Week 1): Pt will initiate w/c mobility PT Short Term Goal 4 (Week 1): Pt will initiate gait training as safe and able  Skilled Therapeutic Interventions/Progress Updates:    pt received in bed and agreeable to therapy. Pt directed in supine>sit from flat bed mod A with assist for L UE management into rolling and BLE management to sitting; min A for positioning at EOB and mod A for stand pivot transfer to WC at pt's L. Pt taken to gym dependently in [facility] for time, directed in gait training in // at mod A for LLLE manual facilitation for step length, placement, and blocking for stance phase for weight bearing; completed 10' x3 with seated rest breaks between 2/2 fatigue. Pt benefited from VC for stepping sequencing and posture throughout. Pt directed in x5 STS from [facility] to FWW with mirror feedback at mod A for standing and emphasis being for improved weight bearing on LLE, midline standing posture, and improved knee extension and trunk extension with static standing, once in standing min A for balance with BUE support at walker, quad tapping at L quad for knee extension with good effect and VC for posture. Pt reported at this time she needed to use restroom. Pt taken in [facility] dependently for time to restroom, directed in stand pivot transfer from [facility] to toilet with (+) grab bar at max A to pt's L with L knee blocking. Pt (+) bladder void and able to initiate hygiene however required PT to complete at max A. Pt directed in standing from toilet, max A for donning pants and brief  then directed in pivot to transfer to [facility] to pt's R, mod A. Pt taken to bedside in [facility], directed in stand pivot with FWW at max A with L knee blocking to pt's R to sit EOB. Then sit>supin mod A for BLE management, max A x2 for upward scooting in bed. Pt left in supine, alarm set, All needs in reach and in good condition. Call light in hand.    Therapy Documentation Precautions:  Precautions Precautions: Fall Precaution Comments: Left side weakness. Inattention to L Restrictions Weight Bearing Restrictions: No General: PT Amount of Missed Time (min): 15 Minutes PT Missed Treatment Reason: Patient fatigue   Therapy/Group: Individual Therapy  Barbaraann Faster 08/02/2020, 3:39 PM

## 2020-08-02 NOTE — Progress Notes (Signed)
Occupational Therapy Weekly Progress Note  Patient Details  Name: Nicole [name] MRN: [id number] Date of Birth: 1930/08/13  Beginning of progress report period: July 27, 2020 End of progress report period: August 02, 2020  Patient has met 3 of 4 short term goals.  Pt is making slow but steady progress with BADLs and functional transfers.  Pt requires mod/max A for bed mobility (supine<>sit EOB), max A for squat pivot transfers, and mod A for sit<>stand with max verbal cues for sequencing. Pt currently exhibits a weak (nonfunctional) grasp and trace biceps.  Pt requires max facilitation for shoulder/scapula elevation and depression. Sitting balance EOB/EOM with min A/CGA. Pt is Mod overall for UB/LB bathing and dressing at shower and sink level, requires hand over hand assist to use LUE for tasks and assist for thoroughness, able to wash buttocks at shower level with lateral lean. Able to maintain static standing with Mod assist for standing balance and blocking LLE at sink for LB bathe/dress at this time.  Patient continues to demonstrate the following deficits: muscle weakness and LUE hemi, decreased cardiorespiratoy endurance, impaired timing and sequencing, abnormal tone, unbalanced muscle activation, decreased coordination and decreased motor planning, decreased attention to left, decreased initiation, decreased awareness, decreased problem solving, decreased safety awareness and delayed processing and decreased sitting balance, decreased standing balance, decreased postural control, hemiplegia and decreased balance strategies and therefore will continue to benefit from skilled OT intervention to enhance overall performance with BADL and Reduce care partner burden.  Patient progressing toward [name] term goals..  Continue plan of care.  OT Short Term Goals Week 1:  OT Short Term Goal 1 (Week 1): Pt will complete UB dressing with min A OT Short Term Goal 1 - Progress (Week 1): Progressing  toward goal OT Short Term Goal 2 (Week 1): Pt will recall hemi dressing techniques with no more than min cueing OT Short Term Goal 2 - Progress (Week 1): Met OT Short Term Goal 3 (Week 1): Pt will increase functional activity tolerance by remaining OOB for 30 min OT Short Term Goal 3 - Progress (Week 1): Met OT Short Term Goal 4 (Week 1): Pt will maintain static sitting balance with min A during ADL task OT Short Term Goal 4 - Progress (Week 1): Met Week 2:  OT Short Term Goal 1 (Week 2): Pt will complete UB dressing with min A OT Short Term Goal 2 (Week 2): Pt will perform toilet transfers with mod A OT Short Term Goal 3 (Week 2): Pt will complete LB dressing tasks with max A with sit<>stand OT Short Term Goal 4 (Week 2): Pt will complete toileting tasks with max A   Leroy Libman 08/02/2020, 7:27 AM

## 2020-08-02 NOTE — Patient Care Conference (Signed)
Inpatient RehabilitationTeam Conference and Plan of Care Update Date: 08/02/2020   Time: 11:06 AM    Patient Name: Nicole [name]      Medical Record Number: [id number]  Date of Birth: Jun 02, 1930 Sex: Female         Room/Bed: 4W04C/4W04C-01 Payor Info: Payor: [employer]ICARE / Plan: BCBS MEDICARE / Product Type: *No Product type* /    Admit Date/Time:  07/26/2020  5:45 PM  Primary Diagnosis:  Cerebrovascular accident (CVA) of right basal ganglia [facility])  Hospital Problems: Principal Problem:   Cerebrovascular accident (CVA) of right basal ganglia [facility])    Expected Discharge Date: Expected Discharge Date: 08/17/20  Team Members Present: Physician leading conference: Dr. Sula Soda Care Coodinator Present: Cecile Sheerer, LCSWA;Haron Beilke Marlyne Beards, RN, BSN, CRRN Nurse Present: Despina Hidden, RN PT Present: Otelia Sergeant, PT OT Present: Ardis Rowan, COTA;Jennifer Katrinka Blazing, OT SLP Present: Colin Benton, SLP PPS Coordinator present : Edson Snowball, Park Breed, SLP     Current Status/Progress Goal Weekly Team Focus  Bowel/Bladder   patient continent of B/B; last BM 07/31/20  maintain continence  Q2H toileting/PRN   Swallow/Nutrition/ Hydration             ADL's   bathing-mod A; UB dressing-mod A; LB dressing-+2; toileting transfers-max A; toileting-max A; trace grasp/extension; trace biceps  min A overall  BADL retraining, LUE NMR, functional transfers, activity tolerance, safety awareness, education   Mobility   mod/max bed mobility, mod/max squat pivot transfers, min/mod sit to stand, gait in // bars with mod A and close w/c follow  min A overall, short distance gait, Supervision w/c mobility  LLE NMR, standing and gait in // bars, transfers   Communication             Safety/Cognition/ Behavioral Observations  Supervision A medication/money management, Use hearing amplifer  Supervision A, upgraded complex problem solving to Supervision A - 9/27   anticipatory awareness and recall   Pain   Patient complained of LLE pain, knee pain. PRN tramadol effective  Patient pain level will be less than 3  Assess pain QShift/PRN   Skin   Skin intact, no obvious signs of breakdown/infection  Patient will remain free of breakdown/infection  Assess skin QShift/PRN     Discharge Planning:  Pt to d/c to home with her dtr [name]. She currently works a hybrid work schedule and is working towards hopefully being able to work from home to help assist her mother.   Team Discussion: Continent B/B. Will schedule Tylenol. PT Mod/max assist currently, Supervision goals. OT Max assist toileting and transfers, mod assist ADL's, min assist goals. SLP supervision and hearing amplifier helps with sessions. Will need a W/C for home. Patient on target to meet rehab goals: yes  *See Care Plan and progress notes for long and short-term goals.   Revisions to Treatment Plan:  MD will schedule Tylenol  Teaching Needs: Continue family education  Current Barriers to Discharge: Inaccessible home environment, Home enviroment access/layout and left knee pain.  Possible Resolutions to Barriers: Daughter will need continued family training, patient will require supervision at discharge, Tylenol, diclofenac, and ice for knee pain.     Medical Summary Current Status: Left knee pain- currently receiving prn tylenol and diclofenac gel TID  Barriers to Discharge: Decreased family/caregiver support;Medical stability;Home enviroment access/layout  Barriers to Discharge Comments: Was living alone, left knee pain Possible Resolutions to [employer] Focus: Family training of daughter, will need wheelchair at home, schedule tylenol three times  per day, diclofenac gel four times per day, and ice three times per day   Continued Need for Acute Rehabilitation Level of Care: The patient requires daily medical management by a physician with specialized training in physical  medicine and rehabilitation for the following reasons: Direction of a multidisciplinary physical rehabilitation program to maximize functional independence : Yes Medical management of patient stability for increased activity during participation in an intensive rehabilitation regime.: Yes Analysis of laboratory values and/or radiology reports with any subsequent need for medication adjustment and/or medical intervention. : Yes   I attest that I was present, lead the team conference, and concur with the assessment and plan of the team.   Tennis Must 08/02/2020, 1:55 PM

## 2020-08-02 NOTE — Progress Notes (Signed)
Occupational Therapy Session Note  Patient Details  Name: Nicole [name] MRN: [id number] Date of Birth: 05-15-1930  Today's Date: 08/02/2020 OT Individual Time: 0802-2336 OT Individual Time Calculation (min): 59 min    Short Term Goals: Week 1:  OT Short Term Goal 1 (Week 1): Pt will complete UB dressing with min A OT Short Term Goal 1 - Progress (Week 1): Progressing toward goal OT Short Term Goal 2 (Week 1): Pt will recall hemi dressing techniques with no more than min cueing OT Short Term Goal 2 - Progress (Week 1): Met OT Short Term Goal 3 (Week 1): Pt will increase functional activity tolerance by remaining OOB for 30 min OT Short Term Goal 3 - Progress (Week 1): Met OT Short Term Goal 4 (Week 1): Pt will maintain static sitting balance with min A during ADL task OT Short Term Goal 4 - Progress (Week 1): Met Week 2:  OT Short Term Goal 1 (Week 2): Pt will complete UB dressing with min A OT Short Term Goal 2 (Week 2): Pt will perform toilet transfers with mod A OT Short Term Goal 3 (Week 2): Pt will complete LB dressing tasks with max A with sit<>stand OT Short Term Goal 4 (Week 2): Pt will complete toileting tasks with max A  Skilled Therapeutic Interventions/Progress Updates:    Pt greeted at time of session supine in bed resting eating breakfast with HOB elevated, eating with set up assist. Supine to sit EOB Mod A with extended time, squat pivot > wheelchair Mod A, assisted with doffing gown for time management sitting at sink level. UB/LB bathe Mod overall with therapist facilitation of hand over hand assist to use LUE to hold wash cloth and wash RUE underarm and R side of the body, also encouraging use of L hand as gross assist to hold items like toothepaste and doedorant while R hand removes cap. Sit to stand at sink level Mod/Max with support of sink surface for therapist to perform dependent washing of buttocks while pt maintained standing balance with Mod A for support on L side,  blocking LLE. Mirror for feedback throughout for upright midline posture, UB dress with shirt and bra Mod and LB dress Max today pt able to assist with threading with figure four, physical assist to achieve figure four position. Total assist for donning over hips in standing. Squat pivot back to bed with Mod/Max A as pt was fatigued. Positioned with LUE supported at bed level, call bell in reach all needs met.   Therapy Documentation Precautions:  Precautions Precautions: Fall Precaution Comments: Left side weakness. Inattention to L Restrictions Weight Bearing Restrictions: No     Therapy/Group: Individual Therapy  Viona Gilmore 08/02/2020, 8:26 AM

## 2020-08-02 NOTE — Progress Notes (Signed)
Occupational Therapy Session Note  Patient Details  Name: Nicole [name] MRN: [id number] Date of Birth: 1929-11-12  Today's Date: 08/02/2020 OT Individual Time: 1015-1100 OT Individual Time Calculation (min): 45 min    Short Term Goals: Week 2:  OT Short Term Goal 1 (Week 2): Pt will complete UB dressing with min A OT Short Term Goal 2 (Week 2): Pt will perform toilet transfers with mod A OT Short Term Goal 3 (Week 2): Pt will complete LB dressing tasks with max A with sit<>stand OT Short Term Goal 4 (Week 2): Pt will complete toileting tasks with max A  Skilled Therapeutic Interventions/Progress Updates:    Pt resting in bed upon arrival. OT intervention with focus on bed mobility, squat pivot transfers, LUE NMR, sitting balance, and activity tolerance to increase independence with BADLs. Supine>sit EOB with mod A for BLE management, squat pivot transfer with max A. See below for NMR and NMES.  1:1 NMES applied to LUE biceps to facilitate elbow flexion   Ratio 1:3 Rate 35 pps Waveform- Asymmetric Ramp 1.0 Pulse 300 Intensity- 16 Duration -   10 mins  Pt with increased independent muscle contraction  No adverse reactions after treatment and is skin intact.   Therapy Documentation Precautions:  Precautions Precautions: Fall Precaution Comments: Left side weakness. Inattention to L Restrictions Weight Bearing Restrictions: No Pain:  Pt denies pain this morning   Other Treatments: Treatments Neuromuscular Facilitation: Left;Upper Extremity;Activity to increase motor control;Activity to increase timing and sequencing;Activity to increase sustained activation Modalities Modalities: Copywriter, advertising Goals: Strength;Neuromuscular facilitation Weight Bearing Technique Weight Bearing Technique: Yes LUE Weight Bearing Technique: Forearm seated;Extended arm seated Response to Weight Bearing Technique: increased elbow flexion  and wrist/hand flexion/extension   Therapy/Group: Individual Therapy  Rich Brave 08/02/2020, 2:20 PM

## 2020-08-03 ENCOUNTER — Inpatient Hospital Stay (HOSPITAL_COMMUNITY): Payer: Medicare Other | Admitting: *Deleted

## 2020-08-03 ENCOUNTER — Encounter (HOSPITAL_COMMUNITY): Payer: Medicare Other | Admitting: Psychology

## 2020-08-03 ENCOUNTER — Inpatient Hospital Stay (HOSPITAL_COMMUNITY): Payer: Medicare Other | Admitting: Speech Pathology

## 2020-08-03 ENCOUNTER — Inpatient Hospital Stay (HOSPITAL_COMMUNITY): Payer: Medicare Other | Admitting: Occupational Therapy

## 2020-08-03 NOTE — Progress Notes (Signed)
Occupational Therapy Session Note  Patient Details  Name: Nicole [name] MRN: [id number] Date of Birth: [birth year]  Today's Date: 08/03/2020 OT Individual Time: 6886-4847 OT Individual Time Calculation (min): 28 min    Short Term Goals: Week 2:  OT Short Term Goal 1 (Week 2): Pt will complete UB dressing with min A OT Short Term Goal 2 (Week 2): Pt will perform toilet transfers with mod A OT Short Term Goal 3 (Week 2): Pt will complete LB dressing tasks with max A with sit<>stand OT Short Term Goal 4 (Week 2): Pt will complete toileting tasks with max A  Skilled Therapeutic Interventions/Progress Updates:    Pt greeted at time of session reclined in bed resting but agreeable to OT session to get dressed. Supine to sit EOB Mod A with assist to manage LLE and min for trunk. Stand pivot transfer without AD with therapist for support and blocking LLE with Max A to wheelchair. Set up at sink level and pt doffed nightgown with min/mod A for problem solve. UB dressing with bra and pull over shirt with Mod A, pants with 2 helpers with pt able to help thread with forward weight shifting to reach BLEs with Min A for dynamic sitting balance. Static stand at sink level with Mod A for standing balance with 2nd helper assist to don over hips. C/o L knee pain which is normal for her, rest break provided. Set up in wheelchair with alarm on, call bell in reach and all needs met.     Therapy Documentation Precautions:  Precautions Precautions: Fall Precaution Comments: Left side weakness. Inattention to L Restrictions Weight Bearing Restrictions: No     Therapy/Group: Individual Therapy  Viona Gilmore 08/03/2020, 8:04 AM

## 2020-08-03 NOTE — Progress Notes (Signed)
Physical Therapy Session Note  Patient Details  Name: Nicole [name] MRN: [id number] Date of Birth: Jun 18, 1930  Today's Date: 08/03/2020 PT Individual Time: 9150-4136 PT Individual Time Calculation (min): 42 min   Short Term Goals: Week 1:  PT Short Term Goal 1 (Week 1): Pt will maintain static sitting balance x 5 min with min A PT Short Term Goal 2 (Week 1): Pt will perform least restrictive transfer with assist x 1 PT Short Term Goal 3 (Week 1): Pt will initiate w/c mobility PT Short Term Goal 4 (Week 1): Pt will initiate gait training as safe and able  Skilled Therapeutic Interventions/Progress Updates: Pt presented in bed agreeable to therapy. Pt denies pain during session. Pt performed supine to sit with modA for LLE management and truncal support. Performed squat pivot transfer to L into w/c with modA and PTA blocking L knee. Pt transported to rehab gym for energy conservation. Performed squat pivot transfer to R with modA. Pt participated in STS x 5 with mirror feedback and no AD for BLE strengthening and static balance with PTA blocking R knee and providing cues to improve anterior lean during transitional phase and to improve erect posture once in standing. Pt then participated in gait training 94f and 164fwith mirror feedback and modA overall. Pt was able to advance LLE and PTA provided facilitation of L quad to improve TKE in stance phase. Mirror feedback was use to improve pt's posture and to maintain straight trajectory. Pt transported back to room once completed and pt performed scooting in preparation for squat pivot back to bed. SLP, John then entered room prior to transfer so pt remained in chair. Pt handed off to John for next session with current needs met.       Therapy Documentation Precautions:  Precautions Precautions: Fall Precaution Comments: Left side weakness. Inattention to L Restrictions Weight Bearing Restrictions: No General:   Vital Signs: Therapy  Vitals Temp: 97.6 F (36.4 C) Pulse Rate: 72 Resp: 16 BP: (!) 133/51 Patient Position (if appropriate): Lying Oxygen Therapy SpO2: 96 % O2 Device: Room Air Pain: Pain Assessment Pain Scale: 0-10 Pain Score: 6  Pain Type: Acute pain Pain Location: Knee Pain Orientation: Left Pain Descriptors / Indicators: Aching Pain Frequency: Intermittent Pain Onset: On-going Patients Stated Pain Goal: 2 Pain Intervention(s): Medication (See eMAR) Mobility:   Locomotion :    Trunk/Postural Assessment :    Balance:   Exercises:   Other Treatments: Treatments Neuromuscular Facilitation: Left;Upper Extremity;Activity to increase motor control;Activity to increase timing and sequencing;Activity to increase sustained activation Weight Bearing Technique Weight Bearing Technique: Yes LUE Weight Bearing Technique: Forearm seated;Extended arm seated Response to Weight Bearing Technique: increased elbow flexion/extension and shoulder adduction/abduction    Therapy/Group: Individual Therapy  Dreamer Carillo 08/03/2020, 3:20 PM

## 2020-08-03 NOTE — Progress Notes (Signed)
Occupational Therapy Session Note  Patient Details  Name: Nicole [name] MRN: [id number] Date of Birth: [birth year]  Today's Date: 08/03/2020 OT Individual Time: 0930-1040 OT Individual Time Calculation (min): 70 min    Short Term Goals: Week 2:  OT Short Term Goal 1 (Week 2): Pt will complete UB dressing with min A OT Short Term Goal 2 (Week 2): Pt will perform toilet transfers with mod A OT Short Term Goal 3 (Week 2): Pt will complete LB dressing tasks with max A with sit<>stand OT Short Term Goal 4 (Week 2): Pt will complete toileting tasks with max A  Skilled Therapeutic Interventions/Progress Updates:    Pt resting in bed upon arrival.  OT intervention with focus on bed mobility, sitting balance, functional transfers, LUE NMR, and LUE therapeutic activities to increase independence with BADLs. Supine>sit EOB with mod A for BLE management. Sidelying to sit with min A. Squat pivot transfer with mod A and max multimodal cues. Pt transitioned to gym and transferred to [facility] for weight bearing per below.  Pt transferred back to w/c.  Pt used UE glider to facilitate LUE shoulder horizontal adduction/abduction and transitioned to performing same functiona on table resting on pillow case. Pt engaged in grasp/release of large legos blocks. Pt returned to room and remained in w/c with all needs within reach and belt alarm activated. Pt with increased grasp/release, biceps, and shoulder activation this morning.   Therapy Documentation Precautions:  Precautions Precautions: Fall Precaution Comments: Left side weakness. Inattention to L Restrictions Weight Bearing Restrictions: No    Pain: Pain Assessment Pain Scale: 0-10 Pain Score: 0-No pain   Other Treatments: Treatments Neuromuscular Facilitation: Left;Upper Extremity;Activity to increase motor control;Activity to increase timing and sequencing;Activity to increase sustained activation Weight Bearing Technique Weight Bearing Technique:  Yes LUE Weight Bearing Technique: Forearm seated;Extended arm seated Response to Weight Bearing Technique: increased elbow flexion/extension and shoulder adduction/abduction   Therapy/Group: Individual Therapy  Rich Brave 08/03/2020, 12:17 PM

## 2020-08-03 NOTE — Progress Notes (Signed)
Speech Language Pathology Weekly Progress and Session Note  Patient Details  Name: Nicole [name] MRN: [id number] Date of Birth: February 15, 1930  Beginning of progress report period: July 27, 2020 End of progress report period: August 03, 2020  Today's Date: 08/03/2020 SLP Individual Time: 1345-1430 SLP Individual Time Calculation (min): 45 min  Short Term Goals: Week 1: SLP Short Term Goal 1 (Week 1): Pt will utilize external memory aids to recall new, daily information with Min A verbal cues. SLP Short Term Goal 1 - Progress (Week 1): Met SLP Short Term Goal 2 (Week 1): Pt will attend to left field of environement during functional tasks with mod A verbal cues. SLP Short Term Goal 2 - Progress (Week 1): Met SLP Short Term Goal 3 (Week 1): Pt will demonstrate functional problem solving for familiar mildly complex tasks with mod A verbal cues. SLP Short Term Goal 3 - Progress (Week 1): Met SLP Short Term Goal 4 (Week 1): Pt will demonstrate  intellectual awareness by identifying 2 cognitive and 2 physical impairments with mod A verbal and visual cues. SLP Short Term Goal 4 - Progress (Week 1): Met    New Short Term Goals: Week 2: SLP Short Term Goal 1 (Week 2): Patient will perform complex level ADL's (medication and money management tasks) with supervision A. SLP Short Term Goal 2 (Week 2): Patient will demonstrate anticipatory awareness related to her deficits by stating logical solutions to hypothetical but likely problems she will face after discharge, with minA. SLP Short Term Goal 3 (Week 2): Patient will participate in reassessment of cognitive functioning. SLP Short Term Goal 4 (Week 2): Patient will demonstrate adequate problem solving and reasoning to complete novel, functional tasks with minA.  Weekly Progress Updates:  Patient has made very good progress and met all 4 STG's. She is demonstrating improved awareness, self-monitoring and self-correcting and ability to complete  complex level functional problem solving. She will continue to benefit from SLP intervention to achieve and possibly surpass LTG's of supervision level for cognitive functioning.    Intensity: Minumum of 1-2 x/day, 30 to 90 minutes Frequency: 3 to 5 out of 7 days Duration/Length of Stay: 10/13 Treatment/Interventions: Cognitive remediation/compensation;Functional tasks;Therapeutic Activities;Patient/family education   Daily Session  Skilled Therapeutic Interventions: Patient seen with daughter present for skilled ST session focusing on cognition. Patient completed check writing task at supervision level with only error being putting date as "28th" instead of 29th. She completed reading instructions task at supervision level with 100% accuracy. SLP, patient and daughter discussed future discharge plan as patient apparently was wanting to move into upstairs master bedroom at daughter's home instead of downstairs bedroom. Patient and daughter both understanding of difficulty of this secondary to patient's physical deficits from CVA. Patient continues to benefit from skilled SLP intervention to maximize cognitive function prior to discharge.  General    Pain Pain Assessment Pain Scale: 0-10 Pain Score: 0-No pain Pain Type: Acute pain Pain Location: Knee Pain Orientation: Left Pain Descriptors / Indicators: Aching Pain Frequency: Intermittent Pain Onset: On-going Patients Stated Pain Goal: 2 Pain Intervention(s): Medication (See eMAR)  Therapy/Group: Individual Therapy   Sonia Baller, MA, CCC-SLP Speech Therapy

## 2020-08-03 NOTE — Consult Note (Signed)
Neuropsychological Consultation   Patient:   Nicole [name]   DOB:   01/27/1930  MR Number:  [id number]  Location:  [facility] [address] CENTER A [address] [id number] [place] [address] Dept: [phone] Loc: [phone]           Date of Service:   08/03/2020  Start Time:   3 PM End Time:   4 PM  Provider/Observer:  Arley Phenix, Psy.D.       Clinical Neuropsychologist       Billing Code/Service: [phone]  Chief Complaint:    Farrell Broerman is an 86 year old female with history of asthma, glaucoma, hypertension, as well as hypothyroidism and hyperlipidemia.  Patient presented on 07/20/2020 with left-sided weakness.  CT/MRI showed positive acute small vessel infarction in the posterior right corona radiata and lentiform regions.  There was no associated mass-effect.  Later follow-up MRI on 9/18 showed slight increase intensity and mild expanded diffusion restriction from the right posterior corona radiata throughout the right lentiform as compared to prior scan.  The patient has progressed since that time and is now been referred to the comprehensive inpatient rehabilitation program for therapy particular and issues associated with motor deficits.  Reason for Service:  Patient was referred for neuropsychological consultation due to coping and adjustment issues with extended hospital stay.  Below is the HPI.  Current review.  HPI: Nicole [name] is an 86 year old right-handed female with history of asthma,glaucoma, hypertension as well as hypothyroidism hyperlipidemia.  Per chart review lives with daughter.  Two-level home bed and bath upstairs.  Independent with ADLs and still driving.  Presented 07/20/2020 left-sided weakness.  CT/MRI showed positive acute small vessel infarct in the posterior right corona radiata and lentiform.  No associated hemorrhage or mass-effect.  CT angiogram of head and neck no emergent large vessel  occlusion or high-grade stenosis.  Echocardiogram with ejection fraction of 60 to 65% no wall motion abnormalities.  Admission chemistries potassium 3.3 glucose 139 WBC 10,800 urine drug screen negative SARS coronavirus negative.  Currently maintained on aspirin and Plavix for CVA prophylaxis as well as BMS investigational med.  Latest follow-up MRI 07/23/2020 showed slight increase intensity and mildly expanded diffusion restriction from the right posterior corona radiata throughout the right lentiform as compared to prior scan of 07/20/2020.  No new areas of ischemia.  Tolerating regular diet.  Therapy evaluations completed and patient was admitted for a comprehensive rehab program  Current Status:  The patient was alert and sitting up in her bed when I entered the room.  Her daughter who she lives with was present in the room when I entered.  The patient is hard of hearing and accommodations were made for her hearing loss by speaking slowly more loudly and focusing on articulation for her to understand.  The patient had expressive language difficulties related to articulation which were probably related to her recent cerebrovascular accident.  The patient denied any significant changes in mood status and denied any depression or anxiety and felt she was coping fairly well with the support of her daughter.  The patient's daughter had numerous questions about what to expect going forward both with rehabilitative efforts, expected course of recovery, and what types of resources she and her mother would need to acquire upon discharge.  Behavioral Observation: Nicole [name]  presents as a 86 y.o.-year-old Right African American Female who appeared her stated age. her dress was Appropriate and she was  Well Groomed and her manners were Appropriate to the situation.  her participation was indicative of Appropriate and Redirectable behaviors.  There were any physical disabilities noted.  she displayed an appropriate  level of cooperation and motivation.     Interactions:    Active Appropriate and Redirectable  Attention:   abnormal and attention span appeared shorter than expected for age  Memory:   abnormal; remote memory intact, recent memory impaired  Visuo-spatial:  not examined  Speech (Volume):  low  Speech:   normal; articulation errors likely due to motor deficits  Thought Process:  Coherent and Relevant  Though Content:  WNL; not suicidal and not homicidal  Orientation:   person, place and time/date  Judgment:   Fair  Planning:   Poor  Affect:    Appropriate  Mood:    Anxious  Insight:   Fair  Intelligence:   normal  Medical History:  History reviewed. No pertinent past medical history.  Psychiatric History:  No prior psychiatric history noted  Family Med/Psych History:  Family History  Problem Relation Age of Onset  . Diabetes Mother   . Diabetes Other   . Hypertension Other     Impression/DX:  Nicole [name] is an 86 year old female with history of asthma, glaucoma, hypertension, as well as hypothyroidism and hyperlipidemia.  Patient presented on 07/20/2020 with left-sided weakness.  CT/MRI showed positive acute small vessel infarction in the posterior right corona radiata and lentiform regions.  There was no associated mass-effect.  Later follow-up MRI on 9/18 showed slight increase intensity and mild expanded diffusion restriction from the right posterior corona radiata throughout the right lentiform as compared to prior scan.  The patient has progressed since that time and is now been referred to the comprehensive inpatient rehabilitation program for therapy particular and issues associated with motor deficits.  The patient was alert and sitting up in her bed when I entered the room.  Her daughter who she lives with was present in the room when I entered.  The patient is hard of hearing and accommodations were made for her hearing loss by speaking slowly more loudly and  focusing on articulation for her to understand.  The patient had expressive language difficulties related to articulation which were probably related to her recent cerebrovascular accident.  The patient denied any significant changes in mood status and denied any depression or anxiety and felt she was coping fairly well with the support of her daughter.  The patient's daughter had numerous questions about what to expect going forward both with rehabilitative efforts, expected course of recovery, and what types of resources she and her mother would need to acquire upon discharge.    Disposition/Plan:  Today we worked on coping issues with extended hospital stay with the patient as well as had extensive discussion about expected course and resources needed with the patient's daughter.  The patient will be going home with her daughter and was living there prior to the stroke.  Diagnosis:    Cerebrovascular accident (CVA) of right basal ganglia (HCC) - Plan: Ambulatory referral to Neurology         Electronically Signed   _______________________ Arley Phenix, Psy.D.

## 2020-08-03 NOTE — Progress Notes (Signed)
[facility] PHYSICAL MEDICINE & REHABILITATION PROGRESS NOTE   Subjective/Complaints: No complaints Working hard Asks about COVID booster  ROS:  Pt denies SOB, abd pain, CP, N/V/C/D, and vision changes   Objective:   No results found. Recent Labs    08/01/20 0627  WBC 8.5  HGB 12.1  HCT 38.3  PLT 270   Recent Labs    08/01/20 0627  NA 140  K 3.9  CL 109  CO2 25  GLUCOSE 96  BUN 16  CREATININE 0.94  CALCIUM 9.4    Intake/Output Summary (Last 24 hours) at 08/03/2020 1256 Last data filed at 08/03/2020 0900 Gross per 24 hour  Intake 120 ml  Output --  Net 120 ml        Physical Exam: Vital Signs Blood pressure (!) 133/51, pulse 72, temperature 97.6 F (36.4 C), resp. rate 16, height 5\' 5"  (1.651 m), weight 66.6 kg, SpO2 96 %. Physical Exam General: Alert, No apparent distress HEENT: Head is normocephalic, atraumatic, PERRLA, EOMI, sclera anicteric, oral mucosa pink and moist, dentition intact, ext ear canals clear,  Neck: Supple without JVD or lymphadenopathy Heart: Reg rate and rhythm. No murmurs rubs or gallops Chest: CTA bilaterally without wheezes, rales, or rhonchi; no distress Abdomen: Soft, non-tender, non-distended, bowel sounds positive. Musculoskeletal:     Cervical back: left knee tender along medial jt line, pain with PROM, mild crepitus    Comments: RUE/RLE- 5-/5 in all muscles tested LUE and LLE- 2+ /5 this AM biceps, triceps, WE, grip, still 2-/5 in LLE- HF, KE, DF and PF -limited by LLE pain Skin:    General: Skin is warm and dry.     Comments: IV L antecubital fossa- looks good   Left heel not visualized today Neurological: Alert;  fair awareness of her deficits. Decreased sensation to light touch in LUE/LLE- facial sensation normal per pt         Assessment/Plan: 1. Functional deficits secondary to R basal Ganglia Stroke with L hemiplegia which require 3+ hours per day of interdisciplinary therapy in a comprehensive inpatient  rehab setting.  Physiatrist is providing close team supervision and 24 hour management of active medical problems listed below.  Physiatrist and rehab team continue to assess barriers to discharge/monitor patient progress toward functional and medical goals  Care Tool:  Bathing    Body parts bathed by patient: Chest, Abdomen, Face, Right upper leg, Left upper leg, Left arm, Right lower leg, Buttocks   Body parts bathed by helper: Right arm, Buttocks, Right lower leg, Left lower leg, Front perineal area     Bathing assist Assist Level: Moderate Assistance - Patient 50 - 74%     Upper Body Dressing/Undressing Upper body dressing   What is the patient wearing?: Bra, Pull over shirt    Upper body assist Assist Level: Moderate Assistance - Patient 50 - 74%    Lower Body Dressing/Undressing Lower body dressing      What is the patient wearing?: Pants     Lower body assist Assist for lower body dressing: 2 Helpers     Toileting Toileting Toileting Activity did not occur (Clothing management and hygiene only): N/A (no void or bm)  Toileting assist Assist for toileting: Moderate Assistance - Patient 50 - 74%     Transfers Chair/bed transfer  Transfers assist  Chair/bed transfer activity did not occur: Refused  Chair/bed transfer assist level: Maximal Assistance - Patient 25 - 49%     Locomotion Ambulation   Ambulation assist  Ambulation activity did not occur: Safety/medical concerns  Assist level: 2 helpers Assistive device: Parallel bars Max distance: 10'   Walk 10 feet activity   Assist  Walk 10 feet activity did not occur: Safety/medical concerns  Assist level: 2 helpers Assistive device: Parallel bars   Walk 50 feet activity   Assist Walk 50 feet with 2 turns activity did not occur: Safety/medical concerns         Walk 150 feet activity   Assist Walk 150 feet activity did not occur: Safety/medical concerns         Walk 10 feet on  uneven surface  activity   Assist Walk 10 feet on uneven surfaces activity did not occur: Safety/medical concerns         Wheelchair     Assist Will patient use wheelchair at discharge?: Yes Type of Wheelchair: Manual Wheelchair activity did not occur: Safety/medical concerns         Wheelchair 50 feet with 2 turns activity    Assist    Wheelchair 50 feet with 2 turns activity did not occur: Safety/medical concerns       Wheelchair 150 feet activity     Assist  Wheelchair 150 feet activity did not occur: Safety/medical concerns       Blood pressure (!) 133/51, pulse 72, temperature 97.6 F (36.4 C), resp. rate 16, height 5\' 5"  (1.651 m), weight 66.6 kg, SpO2 96 %.  Medical Problem List and Plan: 1.  Left-sided weakness/almost hemiplegia secondary to right basal ganglia and corona radiata infarct             -patient may  shower             -ELOS/Goals: 14-18 days- goals CGA- mod I   9/27- making strength exam gains every few days  -Continue CIR 2.  Antithrombotics: -DVT/anticoagulation: SCDs             -antiplatelet therapy: Aspirin 81 mg daily Plavix 85 mg daily and BMS investigational med 3. Pain Management: Tramadol as needed  9/22- still c/o L knee pain, but will con't Tramadol 50 mg q6 hours prn for now.  9/26- add voltaren gel to knee, use ice also  9/28: Ordered ice TID 15 minutes, voltaren gel increased to QID, Tylenol scheduled to q8H.  9/29: reported no pain in therapy today  4. Mood: Provide emotional support             -antipsychotic agents: N/A 5. Neuropsych: This patient is capable of making decisions on her own behalf. 6. Skin/Wound Care: Routine skin checks 7. Fluids/Electrolytes/Nutrition: Routine in and outs with follow-up chemistries  9/22- labs look good on admission and were great on repeat on 9/27 8.  Hyperlipidemia.  Lipitor 80mg  daily 9.  Hypothyroidism.  Synthroid 10.  Asthma.  Continue inhaler as needed 11.  Overactive  bladder.  Ditropan 12.  Constipation.  MiraLAX twice daily as well as Senokot 2 tablets twice daily.  9/26 had results with sorbitol, meds. Stools now loose   -hold miralax for now 13.Glaucoma.pt says she doesn't take drops and doesn't have this dx.   9/22- will check with family.  14. L foot drop- will order PRAFO.  9/22- has been ordered- not here yet.  9/23- didn't see in room- will check into   15. HTN-  9/29: better control 16. Poor appetite  9/23- will check CMP in AM- to see total protein and albumin- and check a prealbumin- I'M concerned she's eating <25%  of meals (so far only seen breakfast).   9/24- pt's prealbumin is 30 (18-38), Albumin is good at 3.9 and total protein good at 8.5- con't regimen 17. Hypokalemia  9/24- K+ 3.4- will replete and recheck Monday.   9/27- K+ 3.9    LOS: 8 days A FACE TO FACE EVALUATION WAS PERFORMED  Nicole [name] Nicole [name] 08/03/2020, 12:56 PM

## 2020-08-04 ENCOUNTER — Inpatient Hospital Stay (HOSPITAL_COMMUNITY): Payer: Medicare Other | Admitting: Physical Therapy

## 2020-08-04 ENCOUNTER — Inpatient Hospital Stay (HOSPITAL_COMMUNITY): Payer: Medicare Other

## 2020-08-04 ENCOUNTER — Inpatient Hospital Stay (HOSPITAL_COMMUNITY): Payer: Medicare Other | Admitting: Speech Pathology

## 2020-08-04 ENCOUNTER — Inpatient Hospital Stay (HOSPITAL_COMMUNITY): Payer: Medicare Other | Admitting: Occupational Therapy

## 2020-08-04 NOTE — Progress Notes (Signed)
Speech Language Pathology Daily Session Note  Patient Details  Name: Nicole [name] MRN: [id number] Date of Birth: 08-11-1930  Today's Date: 08/04/2020 SLP Individual Time: 7915-0569 SLP Individual Time Calculation (min): 29 min  Short Term Goals: Week 2: SLP Short Term Goal 1 (Week 2): Patient will perform complex level ADL's (medication and money management tasks) with supervision A. SLP Short Term Goal 2 (Week 2): Patient will demonstrate anticipatory awareness related to her deficits by stating logical solutions to hypothetical but likely problems she will face after discharge, with minA. SLP Short Term Goal 3 (Week 2): Patient will participate in reassessment of cognitive functioning. SLP Short Term Goal 4 (Week 2): Patient will demonstrate adequate problem solving and reasoning to complete novel, functional tasks with minA.  Skilled Therapeutic Interventions: Pt was seen for skilled ST targeting cognitive goals. SLP facilitated session with overall Min A verbal cues for problem solving and recall during a semi-complex monthly scheduling/calendar task. She also verbally recalled details of previous ST sessions with Min A question cues. Pt left sitting in chair with alarm set and needs within reach. Continue per current plan of care.          Pain Pain Assessment Pain Scale: 0-10 Pain Score: 0-No pain  Therapy/Group: Individual Therapy  Little Ishikawa 08/04/2020, 12:20 PM

## 2020-08-04 NOTE — Progress Notes (Signed)
Occupational Therapy Session Note  Patient Details  Name: Nicole [name] MRN: [id number] Date of Birth: 18-Apr-1930  Today's Date: 08/04/2020 OT Individual Time: 6269-4854 OT Individual Time Calculation (min): 44 min    Short Term Goals: Week 2:  OT Short Term Goal 1 (Week 2): Pt will complete UB dressing with min A OT Short Term Goal 2 (Week 2): Pt will perform toilet transfers with mod A OT Short Term Goal 3 (Week 2): Pt will complete LB dressing tasks with max A with sit<>stand OT Short Term Goal 4 (Week 2): Pt will complete toileting tasks with max A  Skilled Therapeutic Interventions/Progress Updates:    Pt greeted at time of session supine in bed with HOB elevated just getting her breakfast, but agreeable to OT session stating she wanted to wash up at sink today in stead of shower level bathing for time management. Supine to sit EOB with HOB elevated Min/Mod A with assist to manage LLE and min for trunk, squat pivot max A to wheelchair. Set up at sink and performed UB bathing Min for assist to wash RUE, Max for LB bathing with Mod A to maintain standing balance at sink with LLE blocked, assist to wash buttocks but pt able to assist with drying increasing standing balance assist to max A when standing d/t using RUE to dry. UB dress Mod, assist for bra but Min for shirt which is an improvement, utilizing hemidressing techniques and with extended time pt able to manage. LB dress 2 helpers with assist to thread and while static standing, assist to don over hips from 2nd helper. Positioned for comofrt in chair with alarm on, call bell in reach, LUE supported for pt to eat breakfast saying she can "eat on her own" without set up since it is french toast.   Therapy Documentation Precautions:  Precautions Precautions: Fall Precaution Comments: Left side weakness. Inattention to L Restrictions Weight Bearing Restrictions: No     Therapy/Group: Individual Therapy  Erasmo Score 08/04/2020,  8:10 AM

## 2020-08-04 NOTE — Progress Notes (Signed)
Occupational Therapy Session Note  Patient Details  Name: Nicole [name] MRN: [id number] Date of Birth: 1930-08-03  Today's Date: 08/04/2020 OT Individual Time: 1300-1410 OT Individual Time Calculation (min): 70 min    Short Term Goals: Week 2:  OT Short Term Goal 1 (Week 2): Pt will complete UB dressing with min A OT Short Term Goal 2 (Week 2): Pt will perform toilet transfers with mod A OT Short Term Goal 3 (Week 2): Pt will complete LB dressing tasks with max A with sit<>stand OT Short Term Goal 4 (Week 2): Pt will complete toileting tasks with max A  Skilled Therapeutic Interventions/Progress Updates:    Pt resting in bed upon arrival and agreeable to participating in therapy. OT intervention with focus on bed mobility, sitting balance, functional transfers, LUE NMR/therapeutic activities, and activity tolerance to increase independence with BADLs. Supine>sit EOB with mod A to manage BLE off EOB. Squat pivot transfer to w/c with mod A. LUE NMR per below. Pt used UE glider to facilitate LUE shoulder adduction/abduction and flexion/extension. Pt engaged in repetitive movements to activated biceps. Pt reached for nose and with gravity reduced she was able to activate biceps. Pt practiced grasp/release of large legos moving from L>R. Pt requested to return to bed at end of session. Squat pivot transfer with mod A. Sit>supine with mod A. Pt remained in bed with all needs within reach and bed alarm activated.   Therapy Documentation Precautions:  Precautions Precautions: Fall Precaution Comments: Left side weakness. Inattention to L Restrictions Weight Bearing Restrictions: No Pain: Pain Assessment Pain Scale: 0-10 Pain Score: 0-No pain ADL: ADL Eating: Set up Where Assessed-Eating: Bed level Grooming: Minimal assistance Where Assessed-Grooming: Bed level Upper Body Bathing: Moderate cueing, Moderate assistance Where Assessed-Upper Body Bathing: Edge of bed Lower Body Bathing:  Moderate assistance, Moderate cueing Where Assessed-Lower Body Bathing: Bed level Upper Body Dressing: Moderate cueing, Moderate assistance Where Assessed-Upper Body Dressing: Edge of bed Lower Body Dressing: Dependent Where Assessed-Lower Body Dressing: Bed level Toileting: Unable to assess Toilet Transfer: Unable to assess Toilet Transfer Method: Unable to assess   Other Treatments: Treatments Neuromuscular Facilitation: Left;Upper Extremity;Activity to increase motor control;Activity to increase timing and sequencing;Activity to increase sustained activation Weight Bearing Technique Weight Bearing Technique: Yes LUE Weight Bearing Technique: Forearm seated;Extended arm seated   Therapy/Group: Individual Therapy  Rich Brave 08/04/2020, 2:18 PM

## 2020-08-04 NOTE — Progress Notes (Signed)
[facility] PHYSICAL MEDICINE & REHABILITATION PROGRESS NOTE   Subjective/Complaints: Knee pain well controlled Having regular BM Turns off TV to speak with me. Therapy going well.   ROS:  Pt denies SOB, abd pain, CP, N/V/C/D, and vision changes   Objective:   No results found. No results for input(s): WBC, HGB, HCT, PLT in the last 72 hours. No results for input(s): NA, K, CL, CO2, GLUCOSE, BUN, CREATININE, CALCIUM in the last 72 hours.  Intake/Output Summary (Last 24 hours) at 08/04/2020 1019 Last data filed at 08/04/2020 0800 Gross per 24 hour  Intake 600 ml  Output --  Net 600 ml        Physical Exam: Vital Signs Blood pressure (!) 151/76, pulse 70, temperature 98.5 F (36.9 C), temperature source Oral, resp. rate 16, height 5\' 5"  (1.651 m), weight 66.6 kg, SpO2 97 %. General: Alert and oriented x 3, No apparent distress HEENT: Head is normocephalic, atraumatic, PERRLA, EOMI, sclera anicteric, oral mucosa pink and moist, dentition intact, ext ear canals clear,  Neck: Supple without JVD or lymphadenopathy Heart: Reg rate and rhythm. No murmurs rubs or gallops Chest: CTA bilaterally without wheezes, rales, or rhonchi; no distress Abdomen: Soft, non-tender, non-distended, bowel sounds positive. Musculoskeletal:     Cervical back: left knee tender along medial jt line, pain with PROM, mild crepitus    Comments: RUE/RLE- 5-/5 in all muscles tested LUE and LLE- 2+ /5 this AM biceps, triceps, WE, grip, still 2-/5 in LLE- HF, KE, DF and PF -limited by LLE pain Skin:    General: Skin is warm and dry.     Comments: IV L antecubital fossa- looks good   Left heel not visualized today Neurological: Alert;  fair awareness of her deficits. Decreased sensation to light touch in LUE/LLE- facial sensation normal per pt      Assessment/Plan: 1. 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  Physiatrist is providing close team supervision and 24 hour management of active medical problems listed below.  Physiatrist and rehab team continue to assess barriers to discharge/monitor patient progress toward functional and medical goals  Care Tool:  Bathing    Body parts bathed by patient: Chest, Abdomen, Face, Right upper leg, Left upper leg, Left arm, Right lower leg, Buttocks, Front perineal area   Body parts bathed by helper: Right arm, Buttocks, Right lower leg, Left lower leg     Bathing assist Assist Level: Moderate Assistance - Patient 50 - 74%     Upper Body Dressing/Undressing Upper body dressing   What is the patient wearing?: Bra, Pull over shirt    Upper body assist Assist Level: Moderate Assistance - Patient 50 - 74%    Lower Body Dressing/Undressing Lower body dressing      What is the patient wearing?: Pants, Underwear/pull up     Lower body assist Assist for lower body dressing: 2 Helpers     Toileting Toileting Toileting Activity did not occur (Clothing management and hygiene only): N/A (no void or bm)  Toileting assist Assist for toileting: Moderate Assistance - Patient 50 - 74%     Transfers Chair/bed transfer  Transfers assist  Chair/bed transfer activity did not occur: Refused  Chair/bed transfer assist level: Maximal Assistance - Patient 25 - 49%     Locomotion Ambulation   Ambulation assist   Ambulation activity did not occur: Safety/medical concerns  Assist level: 2 helpers Assistive device: Parallel bars Max distance: 10'   Walk 10 feet activity   Assist  Walk 10 feet activity did not occur: Safety/medical concerns  Assist level: 2 helpers Assistive device: Parallel bars   Walk 50 feet activity   Assist Walk 50 feet with 2 turns activity did not occur: Safety/medical concerns         Walk 150 feet activity   Assist Walk 150 feet activity did not occur:  Safety/medical concerns         Walk 10 feet on uneven surface  activity   Assist Walk 10 feet on uneven surfaces activity did not occur: Safety/medical concerns         Wheelchair     Assist Will patient use wheelchair at discharge?: Yes Type of Wheelchair: Manual Wheelchair activity did not occur: Safety/medical concerns         Wheelchair 50 feet with 2 turns activity    Assist    Wheelchair 50 feet with 2 turns activity did not occur: Safety/medical concerns       Wheelchair 150 feet activity     Assist  Wheelchair 150 feet activity did not occur: Safety/medical concerns       Blood pressure (!) 151/76, pulse 70, temperature 98.5 F (36.9 C), temperature source Oral, resp. rate 16, height 5\' 5"  (1.651 m), weight 66.6 kg, SpO2 97 %.  Medical Problem List and Plan: 1.  Left-sided weakness/almost hemiplegia secondary to right basal ganglia and corona radiata infarct             -patient may  shower             -ELOS/Goals: 14-18 days- goals CGA- mod I   9/27- making strength exam gains every few days  -Continue CIR 2.  Antithrombotics: -DVT/anticoagulation: SCDs             -antiplatelet therapy: Aspirin 81 mg daily Plavix 85 mg daily and BMS investigational med 3. Pain Management: Tramadol as needed  9/22- still c/o L knee pain, but will con't Tramadol 50 mg q6 hours prn for now.  9/26- add voltaren gel to knee, use ice also  9/28: Ordered ice TID 15 minutes, voltaren gel increased to QID, Tylenol scheduled to q8H.  9/29: reported no pain in therapy today   9/30: no pain 4. Mood: Provide emotional support             -antipsychotic agents: N/A 5. Neuropsych: This patient is capable of making decisions on her own behalf. 6. Skin/Wound Care: Routine skin checks 7. Fluids/Electrolytes/Nutrition: Routine in and outs with follow-up chemistries  9/22- labs look good on admission and were great on repeat on 9/27 8.  Hyperlipidemia.  Lipitor 80mg   daily 9.  Hypothyroidism.  Synthroid 10.  Asthma.  Continue inhaler as needed 11.  Overactive bladder.  Ditropan 12.  Constipation.  MiraLAX twice daily as well as Senokot 2 tablets twice daily.  9/26 had results with sorbitol, meds. Stools now loose   -hold miralax for now  9/30: moving bowels regularly.  13.Glaucoma.pt says she doesn't take drops and doesn't have this dx.   9/22- will check with family.  14. L foot drop- will order PRAFO.  9/22- has been ordered- not here yet.  9/23- didn't see in room- will check into    9/30: in room, but patient states she has not been wearing. Will place nursing order. 15. HTN-  9/29: better control 16. Poor appetite  9/23- will check CMP in AM- to see total protein and albumin- and check a prealbumin- I'M concerned she's eating <25% of meals (so far only seen breakfast).   9/24- pt's prealbumin is 30 (18-38), Albumin is good at 3.9 and total protein good at 8.5- con't regimen 17. Hypokalemia  9/24- K+ 3.4- will replete and recheck Monday.   9/27- K+ 3.9    LOS: 9 days A FACE TO FACE EVALUATION WAS PERFORMED  Riddhi Grether P Desirai Traxler 08/04/2020, 10:19 AM

## 2020-08-04 NOTE — Progress Notes (Signed)
Bruise noticed on patient's Right forearm, tender to touch and swollen. Patient cannot recall any particular injury to arm. Ice placed and will pass on to night shift.

## 2020-08-04 NOTE — Progress Notes (Signed)
Physical Therapy Session Note  Patient Details  Name: Nicole [name] MRN: [id number] Date of Birth: 04/13/1930  Today's Date: 08/04/2020 PT Individual Time: 1100-1152 PT Individual Time Calculation (min): 52 min   Short Term Goals: Week 1:  PT Short Term Goal 1 (Week 1): Pt will maintain static sitting balance x 5 min with min A PT Short Term Goal 2 (Week 1): Pt will perform least restrictive transfer with assist x 1 PT Short Term Goal 3 (Week 1): Pt will initiate w/c mobility PT Short Term Goal 4 (Week 1): Pt will initiate gait training as safe and able  Skilled Therapeutic Interventions/Progress Updates:    pt received in [facility] and agreeable to therapy. Pt taken to gym dependently for energy conservation. Pt directed in gait training with FWW, mirror feedback at mod A with intermittent min A for 25' +20'+ and 30'; pt benefited from manual assist for upright midline posture, walker maneuvering, and on first rep manual assist for LLE placement and knee extension however on second rep with VC and these manual facilitation techniques, pt able to self place LLE with improved technique and demonstrate improved R LE step length to step through pattern. On final rep pt benefited from assistance with preventing L knee from buckling with manual assist for final 8' however no LOB noted. Pt directed in x5 total STS from [facility] to FWW mod A improving to min A with step-by-step cues for processing and sequencing and increased anterior momentum. Pt directed in NMRE in standing with LUE support at walker with 3x8 clothes pins removal and place at targeted area then replace clothes pins at targeted area. Pt reported feeling very fatigued and requested to return to bed, pt denied to stay in [facility] when asked to sit through lunch and requested to to return to bed. Pt returned to room in [name], directedin gait training with FWW for 5' to bedside, mod A and max A for turn to stand along bedside with decreased processing and L knee  motor control, pt able to take x2 steps backward to bed however then required to sit, mod A to sit safely on bedside. Sit>supine mod A for BLE management. Pt directed in positioning in bed CGA with VC. Pt left in bed, alarm set, All needs in reach and in good condition. Call light in hand.    Pt denied pain at start and end of session  Therapy Documentation Precautions:  Precautions Precautions: Fall Precaution Comments: Left side weakness. Inattention to L Restrictions Weight Bearing Restrictions: No General: PT Amount of Missed Time (min): 8 Minutes PT Missed Treatment Reason: Patient fatigue    Therapy/Group: Individual Therapy  Barbaraann Faster 08/04/2020, 12:00 PM

## 2020-08-05 ENCOUNTER — Inpatient Hospital Stay (HOSPITAL_COMMUNITY): Payer: Medicare Other

## 2020-08-05 ENCOUNTER — Inpatient Hospital Stay: Payer: Medicare Other

## 2020-08-05 ENCOUNTER — Inpatient Hospital Stay (HOSPITAL_COMMUNITY): Payer: Medicare Other | Admitting: Occupational Therapy

## 2020-08-05 ENCOUNTER — Inpatient Hospital Stay (HOSPITAL_COMMUNITY): Payer: Medicare Other | Admitting: Physical Therapy

## 2020-08-05 DIAGNOSIS — I6381 Other cerebral infarction due to occlusion or stenosis of small artery: Secondary | ICD-10-CM

## 2020-08-05 DIAGNOSIS — Z23 Encounter for immunization: Secondary | ICD-10-CM

## 2020-08-05 MED ORDER — SENNOSIDES-DOCUSATE SODIUM 8.6-50 MG PO TABS
2.0000 | ORAL_TABLET | Freq: Every evening | ORAL | Status: DC | PRN
  Filled 2020-08-05: qty 2

## 2020-08-05 NOTE — Progress Notes (Signed)
   Covid-19 Vaccination Clinic  Name:  Nicole [name]    MRN: [id number] DOB: 1930-08-06  08/05/2020  [name] was observed post Covid-19 immunization for 15 minutes without incident. She was provided with Vaccine Information Sheet and instruction to access the V-Safe system.   [name] was instructed to call 911 with any severe reactions post vaccine: Marland Kitchen Difficulty breathing  . Swelling of face and throat  . A fast heartbeat  . A bad rash all over body  . Dizziness and weakness   Immunizations Administered    Name Date Dose VIS Date Route   Pfizer COVID-19 Vaccine 08/05/2020 12:46 PM 0.3 mL 12/30/2018 Intramuscular   Manufacturer: [employer], [employer]   Lot: [id number]   NDC: [id number]

## 2020-08-05 NOTE — Progress Notes (Signed)
PHYSICAL MEDICINE & REHABILITATION PROGRESS NOTE   Subjective/Complaints: Pain is well controlled.  Working well with therapy. She has no complaints.   ROS:  Pt denies SOB, abd pain, CP, N/V/C/D, and vision changes   Objective:   No results found. No results for input(s): WBC, HGB, HCT, PLT in the last 72 hours. No results for input(s): NA, K, CL, CO2, GLUCOSE, BUN, CREATININE, CALCIUM in the last 72 hours.  Intake/Output Summary (Last 24 hours) at 08/05/2020 1241 Last data filed at 08/05/2020 0912 Gross per 24 hour  Intake 478 ml  Output --  Net 478 ml        Physical Exam: Vital Signs Blood pressure (!) 152/62, pulse 65, temperature 98.1 F (36.7 C), resp. rate 16, height 5\' 5"  (1.651 m), weight 66.6 kg, SpO2 96 %. General: Alert and oriented x 3, No apparent distress HEENT: Head is normocephalic, atraumatic, PERRLA, EOMI, sclera anicteric, oral mucosa pink and moist, dentition intact, ext ear canals clear,  Neck: Supple without JVD or lymphadenopathy Heart: Reg rate and rhythm. No murmurs rubs or gallops Chest: CTA bilaterally without wheezes, rales, or rhonchi; no distress Abdomen: Soft, non-tender, non-distended, bowel sounds positive. Musculoskeletal:     Cervical back: left knee tender along medial jt line, pain with PROM, mild crepitus    Comments: RUE/RLE- 5-/5 in all muscles tested LUE and LLE- 2+ /5 this AM biceps, triceps, WE, grip, still 2-/5 in LLE- HF, KE, DF and PF -limited by LLE pain Skin:    General: Skin is warm and dry.     Comments: IV L antecubital fossa- looks good   Left heel not visualized today Neurological: Alert;  fair awareness of her deficits. Decreased sensation to light touch in LUE/LLE- facial sensation normal per pt        Assessment/Plan: 1. 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  Physiatrist is providing close team supervision and 24 hour management of active medical problems listed below.  Physiatrist and rehab team continue to assess barriers to discharge/monitor patient progress toward functional and medical goals  Care Tool:  Bathing    Body parts bathed by patient: Chest, Abdomen, Face, Right upper leg, Left upper leg, Left arm, Right lower leg, Buttocks, Front perineal area   Body parts bathed by helper: Right arm, Buttocks, Right lower leg, Left lower leg     Bathing assist Assist Level: Moderate Assistance - Patient 50 - 74%     Upper Body Dressing/Undressing Upper body dressing   What is the patient wearing?: Bra, Pull over shirt    Upper body assist Assist Level: Moderate Assistance - Patient 50 - 74%    Lower Body Dressing/Undressing Lower body dressing      What is the patient wearing?: Pants, Underwear/pull up     Lower body assist Assist for lower body dressing: 2 Helpers     Toileting Toileting Toileting Activity did not occur (Clothing management and hygiene only): N/A (no void or bm)  Toileting assist Assist for toileting: Moderate Assistance - Patient 50 - 74%     Transfers Chair/bed transfer  Transfers assist  Chair/bed transfer activity did not occur: Refused  Chair/bed transfer assist level: Maximal Assistance - Patient 25 - 49%     Locomotion Ambulation   Ambulation assist   Ambulation activity did not occur: Safety/medical concerns  Assist level: 2 helpers Assistive  device: Parallel bars Max distance: 10'   Walk 10 feet activity   Assist  Walk 10 feet activity did not occur: Safety/medical concerns  Assist level: 2 helpers Assistive device: Parallel bars   Walk 50 feet activity   Assist Walk 50 feet with 2 turns activity did not occur: Safety/medical concerns         Walk 150 feet activity   Assist Walk 150 feet activity did not occur: Safety/medical concerns         Walk  10 feet on uneven surface  activity   Assist Walk 10 feet on uneven surfaces activity did not occur: Safety/medical concerns         Wheelchair     Assist Will patient use wheelchair at discharge?: Yes Type of Wheelchair: Manual Wheelchair activity did not occur: Safety/medical concerns         Wheelchair 50 feet with 2 turns activity    Assist    Wheelchair 50 feet with 2 turns activity did not occur: Safety/medical concerns       Wheelchair 150 feet activity     Assist  Wheelchair 150 feet activity did not occur: Safety/medical concerns       Blood pressure (!) 152/62, pulse 65, temperature 98.1 F (36.7 C), resp. rate 16, height 5\' 5"  (1.651 m), weight 66.6 kg, SpO2 96 %.  Medical Problem List and Plan: 1.  Left-sided weakness/almost hemiplegia secondary to right basal ganglia and corona radiata infarct             -patient may  shower             -ELOS/Goals: 14-18 days- goals CGA- mod I   9/27- making strength exam gains every few days  -Continue CIR 2.  Antithrombotics: -DVT/anticoagulation: SCDs             -antiplatelet therapy: Aspirin 81 mg daily Plavix 85 mg daily and BMS investigational med 3. Pain Management: Tramadol as needed  9/22- still c/o L knee pain, but will con't Tramadol 50 mg q6 hours prn for now.  9/26- add voltaren gel to knee, use ice also  9/28: Ordered ice TID 15 minutes, voltaren gel increased to QID, Tylenol scheduled to q8H.  9/29: reported no pain in therapy today   10/1: denies pain. Stop tramadol.  4. Mood: Provide emotional support             -antipsychotic agents: N/A 5. Neuropsych: This patient is capable of making decisions on her own behalf. 6. Skin/Wound Care: Routine skin checks 7. Fluids/Electrolytes/Nutrition: Routine in and outs with follow-up chemistries  9/22- labs look good on admission and were great on repeat on 9/27 8.  Hyperlipidemia.  Lipitor 80mg  daily 9.  Hypothyroidism.  Synthroid 10.   Asthma.  Continue inhaler as needed 11.  Overactive bladder.  Ditropan 12.  Constipation.  MiraLAX twice daily as well as Senokot 2 tablets twice daily.  9/26 had results with sorbitol, meds. Stools now loose   -hold miralax for now  10/1: moving bowels regularly. Change senna-docusate to HS PRN.  13.Glaucoma.pt says she doesn't take drops and doesn't have this dx.   9/22- will check with family.  14. L foot drop- will order PRAFO.  9/22- has been ordered- not here yet.  9/23- didn't see in room- will check into    9/30: in room, but patient states she has not been wearing. Will place nursing order. 15. HTN-  9/29: better control 16. Poor appetite  9/23- will check CMP in AM- to see total protein and albumin- and check a prealbumin- I'M concerned she's eating <25% of meals (so far only seen breakfast).   9/24- pt's prealbumin is 30 (18-38), Albumin is good at 3.9 and total protein good at 8.5- con't regimen 17. Hypokalemia  9/24- K+ 3.4- will replete and recheck Monday.   9/27- K+ 3.9  18. Impaired cognition: continue SLP  LOS: 10 days A FACE TO FACE EVALUATION WAS PERFORMED  Drema Pry Nicole [name] 08/05/2020, 12:41 PM

## 2020-08-05 NOTE — Progress Notes (Signed)
Speech Language Pathology Daily Session Note  Patient Details  Name: BLAYRE PAPANIA MRN: [id number] Date of Birth: 11-29-1929  Today's Date: 08/05/2020 SLP Individual Time: 1033-1100 SLP Individual Time Calculation (min): 27 min  Short Term Goals: Week 2: SLP Short Term Goal 1 (Week 2): Patient will perform complex level ADL's (medication and money management tasks) with supervision A. SLP Short Term Goal 2 (Week 2): Patient will demonstrate anticipatory awareness related to her deficits by stating logical solutions to hypothetical but likely problems she will face after discharge, with minA. SLP Short Term Goal 3 (Week 2): Patient will participate in reassessment of cognitive functioning. SLP Short Term Goal 4 (Week 2): Patient will demonstrate adequate problem solving and reasoning to complete novel, functional tasks with minA.  Skilled Therapeutic Interventions: Skilled SLP intervention focused on cognition. Calendar organization task completed with min A verbal cues. Minor errors noted when entering scheduled activities. Pt often had to re-read written information when transferring scheduled activities to date listed. Pt able to identify errors with min verbal cues. Pt able to state 3 deficits and household tasks that she will need assistance with due to deficits. Cont with plan of care.      Pain Pain Assessment Pain Scale: Faces Pain Score: 0-No pain Faces Pain Scale: No hurt  Therapy/Group: individual   Amil Amen A Collins Kerby 08/05/2020, 10:54 AM

## 2020-08-05 NOTE — Progress Notes (Signed)
Occupational Therapy Session Note  Patient Details  Name: ENVI EAGLESON MRN: [id number] Date of Birth: 1930-03-06  Today's Date: 08/05/2020 OT Individual Time: 6381-7711 OT Individual Time Calculation (min): 41 min    Short Term Goals: Week 2:  OT Short Term Goal 1 (Week 2): Pt will complete UB dressing with min A OT Short Term Goal 2 (Week 2): Pt will perform toilet transfers with mod A OT Short Term Goal 3 (Week 2): Pt will complete LB dressing tasks with max A with sit<>stand OT Short Term Goal 4 (Week 2): Pt will complete toileting tasks with max A  Skilled Therapeutic Interventions/Progress Updates:  Patient met lying supine in bed in agreement with OT treatment session with focus on self-care re-education and functional transfers as detailed below. Supine to EOB with Min A and use of bed rail with HOB elevated. Seated EOB, patient able to doff nightgown with use of hemi technique and don bra and shirt with Mod A. Patient able to assist raise LLE to thread into pants but required assist to thread RLE and hike over hips in standing with +2 assist. At sink level, patient able to incorporate LUE into grooming tasks to brush teeth, wash face, and comb hair. In dayroom, patient engaged in [place] with towel pushes and AAROM at elbow, wrist, and digits. Session concluded with patient seated in wc with call bell within reach and all needs met.   Therapy Documentation Precautions:  Precautions Precautions: Fall Precaution Comments: Left side weakness. Inattention to L Restrictions Weight Bearing Restrictions: No General:    Therapy/Group: Individual Therapy  Addalynn Kumari R [place]Davis 08/05/2020, 8:34 AM

## 2020-08-05 NOTE — Progress Notes (Signed)
Patient ID: Nicole [name], female   DOB: Jul 02, 1930, 86 y.o.   MRN: [id number]  SW received phone call from pt dtr [name] [phone]) who had questions with regard to planning for 24/7 care for patient as discharge. We discussed what services insurance would offer,  discussed private sitter, and adult day care centers. Daughter would like resources. She confirms she will be here for family edu on 10/6.   SW left Medicaid application, sitter list, and adult day center list in pt room for dtr to pick up.     Cecile Sheerer, MSW, LCSWA Office: [phone] Cell: [phone] Fax: [phone]

## 2020-08-05 NOTE — Progress Notes (Signed)
Physical Therapy Weekly Progress Note  Patient Details  Name: Nicole [name] MRN: [id number] Date of Birth: August 14, 1930  Beginning of progress report period: July 27, 2020 End of progress report period: August 05, 2020  Today's Date: 08/05/2020 PT Individual Time: 1100-1155 PT Individual Time Calculation (min): 55 min   Patient has met 4 of 4 short term goals.  Pt has met all STG and continues to demonstrate progress and motivation to participate with therapy and continues to demonstrate need of skilled PT services in inpatient rehab setting.  Patient continues to demonstrate the following deficits muscle weakness, decreased cardiorespiratoy endurance, impaired timing and sequencing, unbalanced muscle activation, motor apraxia, decreased coordination and decreased motor planning and decreased sitting balance, decreased standing balance, decreased postural control and decreased balance strategies and therefore will continue to benefit from skilled PT intervention to increase functional independence with mobility.  Patient progressing toward long term goals..  Continue plan of care.  PT Short Term Goals Week 1:  PT Short Term Goal 1 (Week 1): Pt will maintain static sitting balance x 5 min with min A PT Short Term Goal 1 - Progress (Week 1): Met PT Short Term Goal 2 (Week 1): Pt will perform least restrictive transfer with assist x 1 PT Short Term Goal 2 - Progress (Week 1): Met PT Short Term Goal 3 (Week 1): Pt will initiate w/c mobility PT Short Term Goal 3 - Progress (Week 1): Met PT Short Term Goal 4 (Week 1): Pt will initiate gait training as safe and able PT Short Term Goal 4 - Progress (Week 1): Met Week 2:  PT Short Term Goal 1 (Week 2): pt to demonstrate gait training with LRAD 50' min A consistently PT Short Term Goal 2 (Week 2): pt to demo static sitting balance without trunk support at [place] for 10 mins PT Short Term Goal 3 (Week 2): pt to demo dynamic standing balance CGA PT  Short Term Goal 4 (Week 2): pt to mobilize [facility] for 75' min A  Skilled Therapeutic Interventions/Progress Updates:    pt received in bed and agreeable to therapy. Pt directed in supine>sit min A from flat bed with manual assist for BLE management and trunk support into full sitting though lightly. Pt directed in STS from bed to FWW at min A with slightly elevated bed height, x2 steps forward and R laterally for transfer setup to [facility] at mod A. Pt taken to hall for [facility] mobility at mod A for L side management with manual assist for placement and single step cues for grip and pushing, 50'. Pt taken rest of distance to gym for energy conservation. Pt directed in gait training with FWW for 25' mod A with pt demonstrating trunk leans with limb advancement, required single step cues for step sequence and L knee extension and L quad tapping for increased mm activation with fair effect, with fatigue worsening L knee buckling noted and limited R step through pattern and required to sit 2/2 fatigue at this distance. Pt then directed in gait training with hall rail used instead of FWW with RUE and PT assisting in management of LUE, pt improved to min A with instances of mod A however pt able to complete at min A grossly, VC for gait pattern and sequencing. Pt able to more consistently demonstrate step through pattern and improved weight shifting however with fatigue pt demonstrated more trunk flexion and lean toward wall on R side, able to correct with VC, one L knee buckle  at end step with mod A at 35'. Pt directed in stand pivot transfers x4 WC<>EOM modA all to R with single step cues for tech and safety awareness, pt required manual assist for walker maneuvering and support to remain in standing at midline. Pt returned to room in [facility] dependeltly for time and energy conservation. Pt requested to return to bed 2/2 fatigue directed in stand pivot transfer from [facility] to FWW at min A with single step cues to complete, sit>supine min A  for BLE management. Pt left in bed, alarm set, All needs in reach and in good condition. Call light in hand.    Therapy Documentation Precautions:  Precautions Precautions: Fall Precaution Comments: Left side weakness. Inattention to L Restrictions Weight Bearing Restrictions: No   Therapy/Group: Individual Therapy  [name] 08/05/2020, 12:06 PM

## 2020-08-05 NOTE — Progress Notes (Signed)
Occupational Therapy Session Note  Patient Details  Name: Nicole [name] MRN: [id number] Date of Birth: 1930/04/06  Today's Date: 08/05/2020 OT Individual Time: 1300-1410 OT Individual Time Calculation (min): 70 min    Short Term Goals: Week 2:  OT Short Term Goal 1 (Week 2): Pt will complete UB dressing with min A OT Short Term Goal 2 (Week 2): Pt will perform toilet transfers with mod A OT Short Term Goal 3 (Week 2): Pt will complete LB dressing tasks with max A with sit<>stand OT Short Term Goal 4 (Week 2): Pt will complete toileting tasks with max A  Skilled Therapeutic Interventions/Progress Updates:    Pt resting in bed upon arrival and agreeable to getting OOB for therapy.  OT intervention with focus on bed mobility, sitting balance, LUE NMR, functional transfers, and activity tolerance to increase independence with BADLs. Supine>sit EOB with min A for LLE management. Squat pivot transfer to w/c with mod A. Pt transitioned to gym and transferred to mat for LUE NMR and table activities. See NMR below. Table activities included reaching for cup and grasp/release. Pt required mod A for reaching but able to grasp/release with min verbal cues to initiate. Pt with restricted supination and stretching performed to facilitate pt ability to perform task.  Pt with supination to neutral.  Increased biceps activation noted with gravity reduced when reaching to touch nose. Pt issued blue foam block.  Pt educated in LUE SROM and pt performs independently. Pt returned to bed at end of session.  Pt remained in bed with all needs within reach and bed alarm acativated.   Therapy Documentation Precautions:  Precautions Precautions: Fall Precaution Comments: Left side weakness. Inattention to L Restrictions Weight Bearing Restrictions: No Pain: Pain Assessment Pain Scale: Faces Faces Pain Scale: No hurt   Other Treatments: Treatments Neuromuscular Facilitation: Left;Upper Extremity;Activity to  increase motor control;Activity to increase timing and sequencing;Activity to increase sustained activation Weight Bearing Technique Weight Bearing Technique: Yes LUE Weight Bearing Technique: Forearm seated;Extended arm seated Response to Weight Bearing Technique: increased elbow flexion/extension and shoulder adduction/abduction   Therapy/Group: Individual Therapy  Rich Brave 08/05/2020, 2:21 PM

## 2020-08-06 MED ORDER — LISINOPRIL 2.5 MG PO TABS
2.5000 mg | ORAL_TABLET | Freq: Every day | ORAL | Status: DC
  Administered 2020-08-06 – 2020-08-07 (×2): 2.5 mg via ORAL
  Filled 2020-08-06 (×2): qty 1

## 2020-08-06 MED ORDER — DICLOFENAC SODIUM 1 % EX GEL
2.0000 g | Freq: Four times a day (QID) | CUTANEOUS | Status: DC | PRN
  Administered 2020-08-12: 10:00:00 2 g via TOPICAL
  Filled 2020-08-06: qty 100

## 2020-08-06 NOTE — Plan of Care (Signed)
  Problem: Consults Goal: RH STROKE PATIENT EDUCATION Description: See Patient Education module for education specifics  Outcome: Progressing   Problem: RH BOWEL ELIMINATION Goal: RH STG MANAGE BOWEL WITH ASSISTANCE Description: STG Manage Bowel with Min Assistance. Outcome: Progressing Goal: RH STG MANAGE BOWEL W/MEDICATION W/ASSISTANCE Description: STG Manage Bowel with Medication with Min Assistance. Outcome: Progressing   Problem: RH BLADDER ELIMINATION Goal: RH STG MANAGE BLADDER WITH ASSISTANCE Description: STG Manage Bladder With Min Assistance Outcome: Progressing   Problem: RH SKIN INTEGRITY Goal: RH STG MAINTAIN SKIN INTEGRITY WITH ASSISTANCE Description: STG Maintain Skin Integrity With Min Assistance. Outcome: Progressing   Problem: RH SAFETY Goal: RH STG ADHERE TO SAFETY PRECAUTIONS W/ASSISTANCE/DEVICE Description: STG Adhere to Safety Precautions With Min Assistance and appropriate assistive Device. Outcome: Progressing   Problem: RH PAIN MANAGEMENT Goal: RH STG PAIN MANAGED AT OR BELOW PT'S PAIN GOAL Description: <3 on a 0-10 pain scale. Outcome: Progressing   Problem: RH KNOWLEDGE DEFICIT Goal: RH STG INCREASE KNOWLEDGE OF HYPERTENSION Description: Patient will be able to demonstrate knowledge of HTN medications, dietary restrictions, BP parameters, and follow up care with the MD with Min assist from CIR staff at discharge. Outcome: Progressing Goal: RH STG INCREASE KNOWLEGDE OF HYPERLIPIDEMIA Description: Patient will be able to demonstrate knowledge of HLD medications, diet and exercise goals, and follow up care with the MD with Min assist from CIR staff at discharge. Outcome: Progressing Goal: RH STG INCREASE KNOWLEDGE OF STROKE PROPHYLAXIS Description: Patient will be able to demonstrate knowledge of secondary medications used to prevent future strokes with min assist from CIR staff. Outcome: Progressing

## 2020-08-06 NOTE — Progress Notes (Signed)
[place] PHYSICAL MEDICINE & REHABILITATION PROGRESS NOTE   Subjective/Complaints: No complaints this morning. Denies pain, constipation, insomnia.   ROS:  Pt denies SOB, abd pain, CP, N/V/C/D, and vision changes   Objective:   No results found. No results for input(s): WBC, HGB, HCT, PLT in the last 72 hours. No results for input(s): NA, K, CL, CO2, GLUCOSE, BUN, CREATININE, CALCIUM in the last 72 hours.  Intake/Output Summary (Last 24 hours) at 08/06/2020 1315 Last data filed at 08/06/2020 0700 Gross per 24 hour  Intake 328 ml  Output --  Net 328 ml        Physical Exam: Vital Signs Blood pressure (!) 160/83, pulse 72, temperature 98.3 F (36.8 C), resp. rate 16, height 5\' 5"  (1.651 m), weight 66.6 kg, SpO2 96 %.  General: Alert and oriented x 3, No apparent distress HEENT: Head is normocephalic, atraumatic, PERRLA, EOMI, sclera anicteric, oral mucosa pink and moist, dentition intact, ext ear canals clear,  Neck: Supple without JVD or lymphadenopathy Heart: Reg rate and rhythm. No murmurs rubs or gallops Chest: CTA bilaterally without wheezes, rales, or rhonchi; no distress Abdomen: Soft, non-tender, non-distended, bowel sounds positive. Musculoskeletal:     Cervical back: left knee tender along medial jt line, pain with PROM, mild crepitus    Comments: RUE/RLE- 5-/5 in all muscles tested LUE and LLE- 2+ /5 this AM biceps, triceps, WE, grip, still 2-/5 in LLE- HF, KE, DF and PF -limited by LLE pain Skin:    General: Skin is warm and dry.     Comments: IV L antecubital fossa- looks good   Left heel not visualized today Neurological: Alert;  fair awareness of her deficits. Decreased sensation to light touch in LUE/LLE- facial sensation normal per pt         Assessment/Plan: 1. 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  Physiatrist is providing close team supervision and 24 hour management of active medical problems listed below.  Physiatrist and rehab team continue to assess barriers to discharge/monitor patient progress toward functional and medical goals  Care Tool:  Bathing    Body parts bathed by patient: Chest, Abdomen, Face, Right upper leg, Left upper leg, Left arm, Right lower leg, Buttocks, Front perineal area   Body parts bathed by helper: Right arm, Buttocks, Right lower leg, Left lower leg     Bathing assist Assist Level: Moderate Assistance - Patient 50 - 74%     Upper Body Dressing/Undressing Upper body dressing   What is the patient wearing?: Bra, Pull over shirt    Upper body assist Assist Level: Moderate Assistance - Patient 50 - 74%    Lower Body Dressing/Undressing Lower body dressing      What is the patient wearing?: Pants, Underwear/pull up     Lower body assist Assist for lower body dressing: 2 Helpers     Toileting Toileting Toileting Activity did not occur (Clothing management and hygiene only): N/A (no void or bm)  Toileting assist Assist for toileting: Moderate Assistance - Patient 50 - 74%     Transfers Chair/bed transfer  Transfers assist  Chair/bed transfer activity did not occur: Refused  Chair/bed transfer assist level: Maximal Assistance - Patient 25 - 49%     Locomotion Ambulation   Ambulation assist   Ambulation activity did not occur: Safety/medical concerns  Assist level: 2 helpers Assistive device: Parallel bars  Max distance: 10'   Walk 10 feet activity   Assist  Walk 10 feet activity did not occur: Safety/medical concerns  Assist level: 2 helpers Assistive device: Parallel bars   Walk 50 feet activity   Assist Walk 50 feet with 2 turns activity did not occur: Safety/medical concerns         Walk 150 feet activity   Assist Walk 150 feet activity did not occur: Safety/medical concerns         Walk  10 feet on uneven surface  activity   Assist Walk 10 feet on uneven surfaces activity did not occur: Safety/medical concerns         Wheelchair     Assist Will patient use wheelchair at discharge?: Yes Type of Wheelchair: Manual Wheelchair activity did not occur: Safety/medical concerns         Wheelchair 50 feet with 2 turns activity    Assist    Wheelchair 50 feet with 2 turns activity did not occur: Safety/medical concerns       Wheelchair 150 feet activity     Assist  Wheelchair 150 feet activity did not occur: Safety/medical concerns       Blood pressure (!) 160/83, pulse 72, temperature 98.3 F (36.8 C), resp. rate 16, height 5\' 5"  (1.651 m), weight 66.6 kg, SpO2 96 %.  Medical Problem List and Plan: 1.  Left-sided weakness/almost hemiplegia secondary to right basal ganglia and corona radiata infarct             -patient may  shower             -ELOS/Goals: 14-18 days- goals CGA- mod I   9/27- making strength exam gains every few days  -Continue CIR 2.  Antithrombotics: -DVT/anticoagulation: SCDs             -antiplatelet therapy: Aspirin 81 mg daily Plavix 85 mg daily and BMS investigational med 3. Pain Management: Tramadol as needed  9/22- still c/o L knee pain, but will con't Tramadol 50 mg q6 hours prn for now.  9/26- add voltaren gel to knee, use ice also  9/28: Ordered ice TID 15 minutes, voltaren gel increased to QID, Tylenol scheduled to q8H.  9/29: reported no pain in therapy today   10/1: denies pain. Stop tramadol.   10/2: denies pain. Change voltaren gel to PRN.   4. Mood: Provide emotional support             -antipsychotic agents: N/A 5. Neuropsych: This patient is capable of making decisions on her own behalf. 6. Skin/Wound Care: Routine skin checks 7. Fluids/Electrolytes/Nutrition: Routine in and outs with follow-up chemistries  9/22- labs look good on admission and were great on repeat on 9/27 8.  Hyperlipidemia.  Lipitor  80mg  daily 9.  Hypothyroidism.  Synthroid 10.  Asthma.  Continue inhaler as needed 11.  Overactive bladder.  Ditropan 12.  Constipation.  MiraLAX twice daily as well as Senokot 2 tablets twice daily.  9/26 had results with sorbitol, meds. Stools now loose   -hold miralax for now  10/1: moving bowels regularly. Change senna-docusate to HS PRN.  13.Glaucoma.pt says she doesn't take drops and doesn't have this dx.   9/22- will check with family.  14. L foot drop- will order PRAFO.  9/22- has been ordered- not here yet.  9/23- didn't see in room- will check into    9/30: in room, but patient states she has not been wearing. Will place nursing order. 15. HTN-  10/2: Uncontrolled. Kidney function is normal Add Lisinopril 2.5mg .  16. Poor appetite  9/23- will check CMP in AM- to see total protein and albumin- and check a prealbumin- I'M concerned she's eating <25% of meals (so far only seen breakfast).   9/24- pt's prealbumin is 30 (18-38), Albumin is good at 3.9 and total protein good at 8.5- con't regimen 17. Hypokalemia  9/27- K+ 3.9  18. Impaired cognition: continue SLP 19. Left sided inattention: continue therapy, education.   LOS: 11 days A FACE TO FACE EVALUATION WAS PERFORMED  Nicole [name] P Jettson Crable 08/06/2020, 1:15 PM

## 2020-08-07 ENCOUNTER — Encounter (HOSPITAL_COMMUNITY): Payer: Medicare Other | Admitting: Occupational Therapy

## 2020-08-07 ENCOUNTER — Inpatient Hospital Stay (HOSPITAL_COMMUNITY): Payer: Medicare Other | Admitting: Physical Therapy

## 2020-08-07 ENCOUNTER — Inpatient Hospital Stay (HOSPITAL_COMMUNITY): Payer: Medicare Other | Admitting: Speech Pathology

## 2020-08-07 MED ORDER — LISINOPRIL 5 MG PO TABS
5.0000 mg | ORAL_TABLET | Freq: Every day | ORAL | Status: DC
  Administered 2020-08-08 – 2020-08-13 (×6): 5 mg via ORAL
  Filled 2020-08-07 (×6): qty 1

## 2020-08-07 NOTE — Plan of Care (Signed)
  Problem: Consults Goal: RH STROKE PATIENT EDUCATION Description: See Patient Education module for education specifics  Outcome: Progressing   Problem: RH BOWEL ELIMINATION Goal: RH STG MANAGE BOWEL WITH ASSISTANCE Description: STG Manage Bowel with Min Assistance. Outcome: Progressing Goal: RH STG MANAGE BOWEL W/MEDICATION W/ASSISTANCE Description: STG Manage Bowel with Medication with Min Assistance. Outcome: Progressing   Problem: RH BLADDER ELIMINATION Goal: RH STG MANAGE BLADDER WITH ASSISTANCE Description: STG Manage Bladder With Min Assistance Outcome: Progressing   Problem: RH SKIN INTEGRITY Goal: RH STG MAINTAIN SKIN INTEGRITY WITH ASSISTANCE Description: STG Maintain Skin Integrity With Min Assistance. Outcome: Progressing   Problem: RH SAFETY Goal: RH STG ADHERE TO SAFETY PRECAUTIONS W/ASSISTANCE/DEVICE Description: STG Adhere to Safety Precautions With Min Assistance and appropriate assistive Device. Outcome: Progressing   Problem: RH PAIN MANAGEMENT Goal: RH STG PAIN MANAGED AT OR BELOW PT'S PAIN GOAL Description: <3 on a 0-10 pain scale. Outcome: Progressing   Problem: RH KNOWLEDGE DEFICIT Goal: RH STG INCREASE KNOWLEDGE OF HYPERTENSION Description: Patient will be able to demonstrate knowledge of HTN medications, dietary restrictions, BP parameters, and follow up care with the MD with Min assist from CIR staff at discharge. Outcome: Progressing Goal: RH STG INCREASE KNOWLEGDE OF HYPERLIPIDEMIA Description: Patient will be able to demonstrate knowledge of HLD medications, diet and exercise goals, and follow up care with the MD with Min assist from CIR staff at discharge. Outcome: Progressing Goal: RH STG INCREASE KNOWLEDGE OF STROKE PROPHYLAXIS Description: Patient will be able to demonstrate knowledge of secondary medications used to prevent future strokes with min assist from CIR staff. Outcome: Progressing   

## 2020-08-07 NOTE — Progress Notes (Signed)
Speech Language Pathology Daily Session Note  Patient Details  Name: SAJE GALLOP MRN: [id number] Date of Birth: 1930/05/25  Today's Date: 08/07/2020 SLP Individual Time: 0102-7253 SLP Individual Time Calculation (min): 40 min  Short Term Goals: Week 2: SLP Short Term Goal 1 (Week 2): Patient will perform complex level ADL's (medication and money management tasks) with supervision A. SLP Short Term Goal 2 (Week 2): Patient will demonstrate anticipatory awareness related to her deficits by stating logical solutions to hypothetical but likely problems she will face after discharge, with minA. SLP Short Term Goal 3 (Week 2): Patient will participate in reassessment of cognitive functioning. SLP Short Term Goal 4 (Week 2): Patient will demonstrate adequate problem solving and reasoning to complete novel, functional tasks with minA.  Skilled Therapeutic Interventions:  Pt was seen for skilled ST targeting cognitive goals.  SLP administered the [facility] Mental Status exam (SLUMS) to measure progress from initial evaluation.  Pt scored 23/30 which places her in range for mild neurocognitive disorder for her level of education (10th grade).  Pt reports she feels her cognition is much improved and back to baseline.  Will defer final decisions regarding changes to plan of care with pt's primary SLP.  Pt was left in bed with bed alarm set and call bell within reach.  Continue per current plan of care.    Pain Pain Assessment Pain Scale: 0-10 Pain Score: 0-No pain  Therapy/Group: Individual Therapy  Cheyna Retana, Melanee Spry 08/07/2020, 12:44 PM

## 2020-08-07 NOTE — Progress Notes (Signed)
Occupational Therapy Session Note  Patient Details  Name: Nicole [name] MRN: [id number] Date of Birth: [birth year]  Today's Date: 08/07/2020 OT Group Time: 1100-1200 OT Group Time Calculation (min): 60 min   Skilled Therapeutic Interventions/Progress Updates:    Pt engaged in therapeutic w/c level dance group focusing on patient choice, UE/LE strengthening, salience, activity tolerance, and social participation. Pt was guided through various dance-based exercises involving UEs/LEs and trunk. All music was selected by group members. Emphasis placed on Lt UE self ROM, Lt attention, and activity tolerance. Min cuing for carryover self ROM techniques while moving UEs in beat to music, set her up for self ROM for the Lt LE as well. Pt with good participation, affect visibly brightened in group environment. At end of session pt was returned to room by OT.     Therapy Documentation Precautions:  Precautions Precautions: Fall Precaution Comments: Left side weakness. Inattention to L Restrictions Weight Bearing Restrictions: No Vital Signs: Therapy Vitals Temp: 98.1 F (36.7 C) Temp Source: Oral Pulse Rate: 84 Resp: 14 BP: (!) 118/54 Patient Position (if appropriate): Sitting Oxygen Therapy SpO2: 99 % O2 Device: Room Air Pain: Pain Assessment Pain Scale: 0-10 Pain Score: 0-No pain ADL: ADL Eating: Set up Where Assessed-Eating: Bed level Grooming: Minimal assistance Where Assessed-Grooming: Bed level Upper Body Bathing: Moderate cueing, Moderate assistance Where Assessed-Upper Body Bathing: Edge of bed Lower Body Bathing: Moderate assistance, Moderate cueing Where Assessed-Lower Body Bathing: Bed level Upper Body Dressing: Moderate cueing, Moderate assistance Where Assessed-Upper Body Dressing: Edge of bed Lower Body Dressing: Dependent Where Assessed-Lower Body Dressing: Bed level Toileting: Unable to assess Toilet Transfer: Unable to assess Toilet Transfer Method: Unable to  assess :     Therapy/Group: Group Therapy  Jacqulin Brandenburger A Markice Torbert 08/07/2020, 4:21 PM

## 2020-08-07 NOTE — Progress Notes (Signed)
[place] PHYSICAL MEDICINE & REHABILITATION PROGRESS NOTE   Subjective/Complaints: No complaints this morning. Denies pain, constipation, insomnia.   ROS:  Pt denies SOB, abd pain, CP, N/V/C/D, and vision changes   Objective:   No results found. No results for input(s): WBC, HGB, HCT, PLT in the last 72 hours. No results for input(s): NA, K, CL, CO2, GLUCOSE, BUN, CREATININE, CALCIUM in the last 72 hours.  Intake/Output Summary (Last 24 hours) at 08/07/2020 1040 Last data filed at 08/07/2020 0700 Gross per 24 hour  Intake 600 ml  Output --  Net 600 ml        Physical Exam: Vital Signs Blood pressure (!) 161/95, pulse 97, temperature 98.3 F (36.8 C), temperature source Oral, resp. rate 18, height 5\' 5"  (1.651 m), weight 66.6 kg, SpO2 95 %. General: Alert and oriented x 3, No apparent distress HEENT: Head is normocephalic, atraumatic, PERRLA, EOMI, sclera anicteric, oral mucosa pink and moist, dentition intact, ext ear canals clear,  Neck: Supple without JVD or lymphadenopathy Heart: Reg rate and rhythm. No murmurs rubs or gallops Chest: CTA bilaterally without wheezes, rales, or rhonchi; no distress Abdomen: Soft, non-tender, non-distended, bowel sounds positive. Musculoskeletal:     Cervical back: left knee tender along medial jt line, pain with PROM, mild crepitus    Comments: RUE/RLE- 5-/5 in all muscles tested LUE and LLE- 2+ /5 this AM biceps, triceps, WE, grip, still 2-/5 in LLE- HF, KE, DF and PF -limited by LLE pain Skin:    General: Skin is warm and dry.     Comments: IV L antecubital fossa- looks good   Left heel not visualized today Neurological: Alert;  fair awareness of her deficits. Decreased sensation to light touch in LUE/LLE- facial sensation normal per pt   Assessment/Plan: 1. 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  Physiatrist is providing close team supervision and 24 hour management of active medical problems listed below.  Physiatrist and rehab team continue to assess barriers to discharge/monitor patient progress toward functional and medical goals  Care Tool:  Bathing    Body parts bathed by patient: Chest, Abdomen, Face, Right upper leg, Left upper leg, Left arm, Right lower leg, Buttocks, Front perineal area   Body parts bathed by helper: Right arm, Buttocks, Right lower leg, Left lower leg     Bathing assist Assist Level: Moderate Assistance - Patient 50 - 74%     Upper Body Dressing/Undressing Upper body dressing   What is the patient wearing?: Bra, Pull over shirt    Upper body assist Assist Level: Moderate Assistance - Patient 50 - 74%    Lower Body Dressing/Undressing Lower body dressing      What is the patient wearing?: Pants, Underwear/pull up     Lower body assist Assist for lower body dressing: 2 Helpers     Toileting Toileting Toileting Activity did not occur (Clothing management and hygiene only): N/A (no void or bm)  Toileting assist Assist for toileting: Moderate Assistance - Patient 50 - 74%     Transfers Chair/bed transfer  Transfers assist  Chair/bed transfer activity did not occur: Refused  Chair/bed transfer assist level: Maximal Assistance - Patient 25 - 49%     Locomotion Ambulation   Ambulation assist   Ambulation activity did not occur: Safety/medical concerns  Assist level: 2 helpers Assistive device: Parallel bars Max distance: 10'  Walk 10 feet activity   Assist  Walk 10 feet activity did not occur: Safety/medical concerns  Assist level: 2 helpers Assistive device: Parallel bars   Walk 50 feet activity   Assist Walk 50 feet with 2 turns activity did not occur: Safety/medical concerns         Walk 150 feet activity   Assist Walk 150 feet activity did not occur: Safety/medical concerns         Walk  10 feet on uneven surface  activity   Assist Walk 10 feet on uneven surfaces activity did not occur: Safety/medical concerns         Wheelchair     Assist Will patient use wheelchair at discharge?: Yes Type of Wheelchair: Manual Wheelchair activity did not occur: Safety/medical concerns         Wheelchair 50 feet with 2 turns activity    Assist    Wheelchair 50 feet with 2 turns activity did not occur: Safety/medical concerns       Wheelchair 150 feet activity     Assist  Wheelchair 150 feet activity did not occur: Safety/medical concerns       Blood pressure (!) 161/95, pulse 97, temperature 98.3 F (36.8 C), temperature source Oral, resp. rate 18, height 5\' 5"  (1.651 m), weight 66.6 kg, SpO2 95 %.  Medical Problem List and Plan: 1.  Left-sided weakness/almost hemiplegia secondary to right basal ganglia and corona radiata infarct             -patient may  shower             -ELOS/Goals: 14-18 days- goals CGA- mod I   9/27- making strength exam gains every few days  -Continue CIR 2.  Antithrombotics: -DVT/anticoagulation: SCDs             -antiplatelet therapy: Aspirin 81 mg daily Plavix 85 mg daily and BMS investigational med 3. Pain Management: Tramadol as needed  9/22- still c/o L knee pain, but will con't Tramadol 50 mg q6 hours prn for now.  9/26- add voltaren gel to knee, use ice also  9/28: Ordered ice TID 15 minutes, voltaren gel increased to QID, Tylenol scheduled to q8H.  9/29: reported no pain in therapy today   10/1: denies pain. Stop tramadol.   10/2: denies pain. Change voltaren gel to PRN.   4. Mood: Provide emotional support             -antipsychotic agents: N/A 5. Neuropsych: This patient is capable of making decisions on her own behalf. 6. Skin/Wound Care: Routine skin checks 7. Fluids/Electrolytes/Nutrition: Routine in and outs with follow-up chemistries  9/22- labs look good on admission and were great on repeat on 9/27 8.   Hyperlipidemia.  Lipitor 80mg  daily 9.  Hypothyroidism.  Synthroid 10.  Asthma.  Continue nhaler as needed 11.  Overactive bladder.  Ditropan 12.  Constipation.  MiraLAX twice daily as well as Senokot 2 tablets twice daily.  9/26 had results with sorbitol, meds. Stools now loose   -hold miralax for now  10/1: moving bowels regularly. Change senna-docusate to HS PRN.  13.Glaucoma.pt says she doesn't take drops and doesn't have this dx.   9/22- will check with family.  14. L foot drop- will order PRAFO.  9/22- has been ordered- not here yet.  9/23- didn't see in room- will check into    9/30: in room, but patient states she has not been wearing. Will place nursing order. 15. HTN-  10/2:  Uncontrolled. Kidney function is normal Add Lisinopril 2.5mg .   10/3: Uncontrolled. Increase Lisinopril to 5mg .  16. Poor appetite  9/23- will check CMP in AM- to see total protein and albumin- and check a prealbumin- I'M concerned she's eating <25% of meals (so far only seen breakfast).   9/24- pt's prealbumin is 30 (18-38), Albumin is good at 3.9 and total protein good at 8.5- con't regimen 17. Hypokalemia  9/27- K+ 3.9  18. Impaired cognition: continue SLP 19. Left sided inattention: continue therapy, education.   LOS: 12 days A FACE TO FACE EVALUATION WAS PERFORMED  Bryssa Tones P Singleton Hickox 08/07/2020, 10:40 AM

## 2020-08-07 NOTE — Progress Notes (Signed)
Physical Therapy Session Note  Patient Details  Name: Nicole [name] MRN: [id number] Date of Birth: February 16, 1930  Today's Date: 08/07/2020 PT Individual Time: 1305-1403 PT Individual Time Calculation (min): 58 min   Short Term Goals: Week 2:  PT Short Term Goal 1 (Week 2): pt to demonstrate gait training with LRAD 50' min A consistently PT Short Term Goal 2 (Week 2): pt to demo static sitting balance without trunk support at [place] for 10 mins PT Short Term Goal 3 (Week 2): pt to demo dynamic standing balance CGA PT Short Term Goal 4 (Week 2): pt to mobilize [facility] for 75' min A  Skilled Therapeutic Interventions/Progress Updates: Pt presented in w/c agreeable to therapy. Pt denies pain during session. Pt transported to rehab gym for time management. Pt performed stand pivot transfer to mat modA with RW and minA for STS. Performed AAROM L knee flexion/extension with use of roller board for forced use of LLE.  Remaining session focused on STS and wt bearing though LLE in standing. Participated in STS with RW requiring min to modA with fatigue. Pt required cues for hand placement and to increase anterior lean for improved COM. PTA blocked R knee during transition to stand then provided facilitation at quad for increased recruitment and cues for anterior translation of hips for improved erect posture. Although pt required mod to max cues with mirror feedback pt was able to achieve full erect posture and allow PTA to facilitate wt shifting to L. Pt also performed toe taps with RLE x 5 with modA and PTA blocking L knee to improve wt bearing through LLE. Performed lateral wt shifts in mirror 3 bouts of 5 (incl STS) for pre-gait activity. Pt required encouragement with activity due to maintain sustained attention to task. Pt then performed squat pivot to w/c with modA and transported to day room. Performed transfer in same manner to NuStep. Pt participated in NuStep L2 x 5 min with use of L hand orthotic and PTA  stabilizing LLE to maintain neutral positioning. Pt required cues to push through range particularly into extension. Pt able to maintain avg 25 SPM with activity. Pt then performed squat pivot transfer modA back to w/c and transported back to room. Pt remained in w/c at end of session and left with belt alarm on, half lap tray in place, call bell within reach and needs met.      Therapy Documentation Precautions:  Precautions Precautions: Fall Precaution Comments: Left side weakness. Inattention to L Restrictions Weight Bearing Restrictions: No General:   Vital Signs: Therapy Vitals Temp: 98.1 F (36.7 C) Temp Source: Oral Pulse Rate: 84 Resp: 14 BP: (!) 118/54 Patient Position (if appropriate): Sitting Oxygen Therapy SpO2: 99 % O2 Device: Room Air Pain: Pain Assessment Pain Scale: 0-10 Pain Score: 0-No pain Mobility:   Locomotion :    Trunk/Postural Assessment :    Balance:   Exercises:   Other Treatments:      Therapy/Group: Individual Therapy  Reatha Sur 08/07/2020, 3:32 PM

## 2020-08-08 ENCOUNTER — Inpatient Hospital Stay (HOSPITAL_COMMUNITY): Payer: Medicare Other | Admitting: Physical Therapy

## 2020-08-08 ENCOUNTER — Inpatient Hospital Stay (HOSPITAL_COMMUNITY): Payer: Medicare Other | Admitting: Occupational Therapy

## 2020-08-08 ENCOUNTER — Inpatient Hospital Stay (HOSPITAL_COMMUNITY): Payer: Medicare Other

## 2020-08-08 LAB — CBC WITH DIFFERENTIAL/PLATELET
Abs Immature Granulocytes: 0.03 10*3/uL (ref 0.00–0.07)
Basophils Absolute: 0.1 10*3/uL (ref 0.0–0.1)
Basophils Relative: 1 %
Eosinophils Absolute: 0.1 10*3/uL (ref 0.0–0.5)
Eosinophils Relative: 1 %
HCT: 37 % (ref 36.0–46.0)
Hemoglobin: 11.8 g/dL — ABNORMAL LOW (ref 12.0–15.0)
Immature Granulocytes: 0 %
Lymphocytes Relative: 29 %
Lymphs Abs: 2.5 10*3/uL (ref 0.7–4.0)
MCH: 31.1 pg (ref 26.0–34.0)
MCHC: 31.9 g/dL (ref 30.0–36.0)
MCV: 97.6 fL (ref 80.0–100.0)
Monocytes Absolute: 0.6 10*3/uL (ref 0.1–1.0)
Monocytes Relative: 7 %
Neutro Abs: 5.4 10*3/uL (ref 1.7–7.7)
Neutrophils Relative %: 62 %
Platelets: 203 10*3/uL (ref 150–400)
RBC: 3.79 MIL/uL — ABNORMAL LOW (ref 3.87–5.11)
RDW: 12.5 % (ref 11.5–15.5)
WBC: 8.7 10*3/uL (ref 4.0–10.5)
nRBC: 0 % (ref 0.0–0.2)

## 2020-08-08 LAB — BASIC METABOLIC PANEL
Anion gap: 10 (ref 5–15)
BUN: 16 mg/dL (ref 8–23)
CO2: 22 mmol/L (ref 22–32)
Calcium: 9.1 mg/dL (ref 8.9–10.3)
Chloride: 108 mmol/L (ref 98–111)
Creatinine, Ser: 0.87 mg/dL (ref 0.44–1.00)
GFR calc Af Amer: >60 mL/min (ref >60)
GFR calc non Af Amer: 59 mL/min — ABNORMAL LOW (ref >60)
Glucose, Bld: 95 mg/dL (ref 70–99)
Potassium: 3.5 mmol/L (ref 3.5–5.1)
Sodium: 140 mmol/L (ref 135–145)

## 2020-08-08 NOTE — Progress Notes (Signed)
Speech Language Pathology Daily Session Note  Patient Details  Name: Nicole [name] MRN: [id number] Date of Birth: 01-15-1930  Today's Date: 08/08/2020 SLP Individual Time: 1102-1200 SLP Individual Time Calculation (min): 58 min  Short Term Goals: Week 2: SLP Short Term Goal 1 (Week 2): Patient will perform complex level ADL's (medication and money management tasks) with supervision A. SLP Short Term Goal 2 (Week 2): Patient will demonstrate anticipatory awareness related to her deficits by stating logical solutions to hypothetical but likely problems she will face after discharge, with minA. SLP Short Term Goal 3 (Week 2): Patient will participate in reassessment of cognitive functioning. SLP Short Term Goal 4 (Week 2): Patient will demonstrate adequate problem solving and reasoning to complete novel, functional tasks with minA.  Skilled Therapeutic Interventions:Skilled ST services focused on cognitive skills. SLP facilitated mildly complex problem solving in time management/calendar task, pt required supervision A verbal cues. Pt demonstrated recall of medication name/function/times per day with aid requiring supervision A verbal cues and mod I for verbal problem solving pertaining to medication dosage. Pt continues to support baseline cognitive abilities. SLP will plan to communicate with pt's daughter during planned education on 10/6, prior to likely an early discharge from ST services. Pt was left in room with call bell within reach and chair alarm set. SLP recommends to continue skilled services.     Pain Pain Assessment Pain Score: 0-No pain  Therapy/Group: Individual Therapy  Kala Ambriz  [facility] 08/08/2020, 2:41 PM

## 2020-08-08 NOTE — Progress Notes (Signed)
[place] PHYSICAL MEDICINE & REHABILITATION PROGRESS NOTE   Subjective/Complaints:  L knee isn't hurting anymore- ready to get up and do therapy.   Denies any particular issues.   ROS:   Pt denies SOB, abd pain, CP, N/V/C/D, and vision changes  Objective:   No results found. Recent Labs    08/08/20 0517  WBC 8.7  HGB 11.8*  HCT 37.0  PLT 203   Recent Labs    08/08/20 0517  NA 140  K 3.5  CL 108  CO2 22  GLUCOSE 95  BUN 16  CREATININE 0.87  CALCIUM 9.1    Intake/Output Summary (Last 24 hours) at 08/08/2020 1414 Last data filed at 08/08/2020 0804 Gross per 24 hour  Intake 460 ml  Output --  Net 460 ml        Physical Exam: Vital Signs Blood pressure 134/64, pulse 73, temperature 98.3 F (36.8 C), resp. rate 18, height 5\' 5"  (1.651 m), weight 66.6 kg, SpO2 96 %. General: awake, alert, sitting up in bed, NAD HEENT: conjugate gaze Heart: RRR Chest: CTA B/L- no W/R/R- good air movement Abdomen: Soft, NT, ND, (+)BS  Musculoskeletal:     Cervical back: L knee not TTP anymore,     Comments: RUE/RLE- 5-/5 in all muscles tested LUE and LLE- 2+ /5 this AM biceps, triceps, WE, grip, still 2-/5 in LLE- HF, KE, DF and PF -limited by LLE pain Skin:    General: Skin is warm and dry.     Comments: IV L antecubital fossa- looks good   Left heel not visualized today Neurological: Alert;  fair awareness of her deficits. Decreased sensation to light touch in LUE/LLE- facial sensation normal per pt   Assessment/Plan: 1. 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  Physiatrist is providing close team supervision and 24 hour management of active medical problems listed below.  Physiatrist and rehab team continue to assess barriers to discharge/monitor patient progress toward functional and medical goals  Care Tool:  Bathing     Body parts bathed by patient: Chest, Abdomen, Face, Right upper leg, Left upper leg, Left arm, Right lower leg, Buttocks, Front perineal area   Body parts bathed by helper: Right arm, Buttocks, Right lower leg, Left lower leg     Bathing assist Assist Level: Moderate Assistance - Patient 50 - 74%     Upper Body Dressing/Undressing Upper body dressing   What is the patient wearing?: Bra, Pull over shirt    Upper body assist Assist Level: Moderate Assistance - Patient 50 - 74%    Lower Body Dressing/Undressing Lower body dressing      What is the patient wearing?: Pants, Underwear/pull up     Lower body assist Assist for lower body dressing: 2 Helpers     Toileting Toileting Toileting Activity did not occur (Clothing management and hygiene only): N/A (no void or bm)  Toileting assist Assist for toileting: Moderate Assistance - Patient 50 - 74%     Transfers Chair/bed transfer  Transfers assist  Chair/bed transfer activity did not occur: Refused  Chair/bed transfer assist level: Maximal Assistance - Patient 25 - 49%     Locomotion Ambulation   Ambulation assist   Ambulation activity did not occur: Safety/medical concerns  Assist level: 2 helpers Assistive device: Parallel bars Max distance: 10'   Walk 10 feet activity   Assist  Walk 10  feet activity did not occur: Safety/medical concerns  Assist level: 2 helpers Assistive device: Parallel bars   Walk 50 feet activity   Assist Walk 50 feet with 2 turns activity did not occur: Safety/medical concerns         Walk 150 feet activity   Assist Walk 150 feet activity did not occur: Safety/medical concerns         Walk 10 feet on uneven surface  activity   Assist Walk 10 feet on uneven surfaces activity did not occur: Safety/medical concerns         Wheelchair     Assist Will patient use wheelchair at discharge?: Yes Type of Wheelchair: Manual Wheelchair activity did not occur:  Safety/medical concerns         Wheelchair 50 feet with 2 turns activity    Assist    Wheelchair 50 feet with 2 turns activity did not occur: Safety/medical concerns       Wheelchair 150 feet activity     Assist  Wheelchair 150 feet activity did not occur: Safety/medical concerns       Blood pressure 134/64, pulse 73, temperature 98.3 F (36.8 C), resp. rate 18, height 5\' 5"  (1.651 m), weight 66.6 kg, SpO2 96 %.  Medical Problem List and Plan: 1.  Left-sided weakness/almost hemiplegia secondary to right basal ganglia and corona radiata infarct             -patient may  shower             -ELOS/Goals: 14-18 days- goals CGA- mod I   9/27- making strength exam gains every few days  -Continue CIR 2.  Antithrombotics: -DVT/anticoagulation: SCDs             -antiplatelet therapy: Aspirin 81 mg daily Plavix 85 mg daily and BMS investigational med 3. Pain Management: Tramadol as needed  9/22- still c/o L knee pain, but will con't Tramadol 50 mg q6 hours prn for now.  9/26- add voltaren gel to knee, use ice also  9/28: Ordered ice TID 15 minutes, voltaren gel increased to QID, Tylenol scheduled to q8H.  9/29: reported no pain in therapy today   10/1: denies pain. Stop tramadol.   10/2: denies pain. Change voltaren gel to PRN.    10/4- no pain in L knee/anywhere else.  4. Mood: Provide emotional support             -antipsychotic agents: N/A 5. Neuropsych: This patient is capable of making decisions on her own behalf. 6. Skin/Wound Care: Routine skin checks 7. Fluids/Electrolytes/Nutrition: Routine in and outs with follow-up chemistries  9/22- labs look good on admission and were great on repeat on 9/27 8.  Hyperlipidemia.  Lipitor 80mg  daily 9.  Hypothyroidism.  Synthroid 10.  Asthma.  Continue nhaler as needed 11.  Overactive bladder.  Ditropan 12.  Constipation.  MiraLAX twice daily as well as Senokot 2 tablets twice daily.  9/26 had results with sorbitol, meds.  Stools now loose   -hold miralax for now  10/1: moving bowels regularly. Change senna-docusate to HS PRN.  13.Glaucoma.pt says she doesn't take drops and doesn't have this dx.   9/22- will check with family.  14. L foot drop- will order PRAFO.  9/22- has been ordered- not here yet.  9/23- didn't see in room- will check into    9/30: in room, but patient states she has not been wearing. Will place nursing order. 15. HTN-  10/2: Uncontrolled. Kidney function is normal  Add Lisinopril 2.5mg .   10/3: Uncontrolled. Increase Lisinopril to 5mg .   10/4-BP better controlled- con't meds 16. Poor appetite  9/23- will check CMP in AM- to see total protein and albumin- and check a prealbumin- I'M concerned she's eating <25% of meals (so far only seen breakfast).   9/24- pt's prealbumin is 30 (18-38), Albumin is good at 3.9 and total protein good at 8.5- con't regimen 17. Hypokalemia  9/27- K+ 3.9  10/4- K+ 3.5- low normal-   18. Impaired cognition: continue SLP 19. Left sided inattention: continue therapy, education.   LOS: 13 days A FACE TO FACE EVALUATION WAS PERFORMED  Alijah Hyde 08/08/2020, 2:14 PM

## 2020-08-08 NOTE — Progress Notes (Signed)
Occupational Therapy Session Note  Patient Details  Name: Nicole [name] MRN: [id number] Date of Birth: [birth year]  Today's Date: 08/08/2020 OT Individual Time: 8099-8338 OT Individual Time Calculation (min): 55 min    Short Term Goals: Week 2:  OT Short Term Goal 1 (Week 2): Pt will complete UB dressing with min A OT Short Term Goal 2 (Week 2): Pt will perform toilet transfers with mod A OT Short Term Goal 3 (Week 2): Pt will complete LB dressing tasks with max A with sit<>stand OT Short Term Goal 4 (Week 2): Pt will complete toileting tasks with max A  Skilled Therapeutic Interventions/Progress Updates:    Pt resting in w/c upon arrival and agreeable to therapy.  OT intervention with focus on LUE NMR and functional movement/activties to increase independence with BADLs. See below for NMR. Pt engaged in table activities with facilitated shoulder horizontal adduction/abduction with shoulder flexion and elbow flexion to grasp small foam cubes, move across table, and release. Pt required assistance with shoulder flexion and elbow flexion but was able to grasp and release without assistance. Pt practiced reaching for her nose and ear with focus on isolate movement.  Pt requires min A to complete activity. Pt pleased with progress. Pt remained in w/c with belt alarm activated and all needs within reach.   Therapy Documentation Precautions:  Precautions Precautions: Fall Precaution Comments: Left side weakness. Inattention to L Restrictions Weight Bearing Restrictions: No   Pain:  Pt denies pain this afternoon Other Treatments: Treatments Neuromuscular Facilitation: Left;Upper Extremity;Activity to increase motor control;Activity to increase timing and sequencing;Activity to increase sustained activation Weight Bearing Technique Weight Bearing Technique: Yes LUE Weight Bearing Technique: Forearm seated;Extended arm seated Response to Weight Bearing Technique: continued increase in elbow  flexion/extension, shoulder adduction/abduciton, grasp/release, wrist extension/flexion   Therapy/Group: Individual Therapy  Rich Brave 08/08/2020, 2:30 PM

## 2020-08-08 NOTE — Progress Notes (Signed)
Physical Therapy Session Note  Patient Details  Name: Nicole [name] MRN: [id number] Date of Birth: [birth year]  Today's Date: 08/08/2020 PT Individual Time: 1000-1045 PT Individual Time Calculation (min): 45 min   Short Term Goals: Week 2:  PT Short Term Goal 1 (Week 2): pt to demonstrate gait training with LRAD 50' min A consistently PT Short Term Goal 2 (Week 2): pt to demo static sitting balance without trunk support at CGA for 10 mins PT Short Term Goal 3 (Week 2): pt to demo dynamic standing balance CGA PT Short Term Goal 4 (Week 2): pt to mobilize [facility] for 75' min A  Skilled Therapeutic Interventions/Progress Updates:    Pt received seated in w/c in room, agreeable to PT session. No complaints of pain. Dependent transport via w/c to therapy gym for time conservation. Squat pivot transfer to the L to mat table with CGA. Sit to stand with min/mod A to RW. Pt with difficulty managing LUE on/off RW, provided her with L hand splint. While in standing pt exhibits decreased weight shift onto LLE, use of mirror and mutimodal cueing for equal WBing through BLE. Sit to stand x 5 reps to RW with 1" step under RLE to encourage weight shift onto LLE. Pt requires cues to keep L heel on the ground during transfer. Standing mini-squats x 10 reps with RW and min A for balance for BLE strengthening. Obtained smaller 16x16 w/c for improved fit. Squat pivot transfer to new w/c with min A to the R. Manual w/c propulsion x 25 ft with use of R UE/LE via hemi-technique with min A needed for steering. Pt left seated in w/c in room with needs in reach, quick release belt and chair alarm in place at end of session.  Therapy Documentation Precautions:  Precautions Precautions: Fall Precaution Comments: Left side weakness. Inattention to L Restrictions Weight Bearing Restrictions: No   Therapy/Group: Individual Therapy   Peter Congo, PT, DPT  08/08/2020, 5:56 PM

## 2020-08-08 NOTE — Progress Notes (Addendum)
Occupational Therapy Session Note  Patient Details  Name: Nicole [name] MRN: [id number] Date of Birth: [birth year]  Today's Date: 08/08/2020 OT Individual Time: 2334-3568 OT Individual Time Calculation (min): 50 min    Short Term Goals: Week 2:  OT Short Term Goal 1 (Week 2): Pt will complete UB dressing with min A OT Short Term Goal 2 (Week 2): Pt will perform toilet transfers with mod A OT Short Term Goal 3 (Week 2): Pt will complete LB dressing tasks with max A with sit<>stand OT Short Term Goal 4 (Week 2): Pt will complete toileting tasks with max A  Skilled Therapeutic Interventions/Progress Updates:  Patient met lying supine in agreement with OT treatment sesison with focus on self-care re-education, ADL transfers, and RUE NMR as detailed below. 0/10 pain at rest and with activity. Supine to EOB with Min A at LLE on and use of bed rail. Squat-pivot transfer to wc on R with Mod A. Patient able to complete walk-in shower transfer to shower chair via squat-pivot transfer. Sit to stand with use of grab bar and L knee block to doff LB clothing. Patient bathed UB with Min A and hand over hand for incorporation of LUE. Min A to bathe LB in sitting with lateral leans. Seated EOB, patient able to don UB clothing with use of hemi dressing technique and LB clothing in sitting/standing with use of RW, cues for hand placement and L knee block with assistance to hike pants over hips. Session concluded with patient seated in wc with call bell within reach, belt alarm on and all needs met.   Therapy Documentation Precautions:  Precautions Precautions: Fall Precaution Comments: Left side weakness. Inattention to L Restrictions Weight Bearing Restrictions: No General:    Therapy/Group: Individual Therapy  Angelyse Heslin R Howerton-Davis 08/08/2020, 7:26 AM

## 2020-08-09 ENCOUNTER — Inpatient Hospital Stay (HOSPITAL_COMMUNITY): Payer: Medicare Other

## 2020-08-09 ENCOUNTER — Inpatient Hospital Stay (HOSPITAL_COMMUNITY): Payer: Medicare Other | Admitting: Physical Therapy

## 2020-08-09 ENCOUNTER — Telehealth: Payer: Self-pay | Admitting: Sports Medicine

## 2020-08-09 NOTE — Telephone Encounter (Signed)
She will need to make a follow up appointment to see me after she is discharged from the hospital. I can not give any meds right now because I am unsure if it will interact with any changes or new meds she may need since being in hospital. Thanks Dr. Marylene Land

## 2020-08-09 NOTE — Progress Notes (Signed)
Physical Therapy Session Note  Patient Details  Name: Nicole [name] MRN: [id number] Date of Birth: Jan 14, 1930  Today's Date: 08/09/2020 PT Individual Time: 1000-1055; 1430-1500 PT Individual Time Calculation (min): 55 min and 30 min  Short Term Goals: Week 2:  PT Short Term Goal 1 (Week 2): pt to demonstrate gait training with LRAD 50' min A consistently PT Short Term Goal 2 (Week 2): pt to demo static sitting balance without trunk support at CGA for 10 mins PT Short Term Goal 3 (Week 2): pt to demo dynamic standing balance CGA PT Short Term Goal 4 (Week 2): pt to mobilize [facility] for 75' min A  Skilled Therapeutic Interventions/Progress Updates:   Session 1:  Pt received seated in w/c in room, agreeable to PT session. No complaints of pain. Dependent transport via w/c to therapy gym for time conservation. Sit to stand with mod A to RW with L hand splint. Ambulation 2 x 15', 1 x 30' with RW with L hand splint and mod A for balance, RW progression, LLE advancement, L knee blocking in stance, and lateral weight shift onto LLE along with w/c follow from a 2nd person for safety. Pt continues to exhibit decreased WBing on LLE in stance. Manual w/c propulsion 2 x 100 ft with use of R UE/LE with hemi-technique with Supervision to CGA with cues for correct technique. Pt left seated in w/c in room with needs in reach at end of session, quick release belt and chair alarm in place.  Session 2: Pt received seated in bed, agreeable to PT session. Bed mobility with mod A for LLE management and some trunk control. Squat pivot transfer to w/c with min A. Dependent transport via w/c to/from therapy gym for time conservation. Squat pivot transfer w/c to/from mat table with min A. Sit to stand with min to mod A to RW with L hand splint. Focus on upright stance with RW, use of mirror for visual feedback and multimodal cueing. Pt tends to exhibit decreased WBing on LLE in standing, able to correct with cues. Sit to stand x  5 reps to RW with min to mod A and manual cues for WBing through LLE during transfer. Standing mini-squats x 7 reps to fatigue with max cues for correct exercise performance. Pt requests to return to bed at end of session. Squat pivot transfer back to bed with min A. Sit to supine mod A for BLE management. Pt left semi-reclined in bed with needs in reach at end of session.   Therapy Documentation Precautions:  Precautions Precautions: Fall Precaution Comments: Left side weakness. Inattention to L Restrictions Weight Bearing Restrictions: No    Therapy/Group: Individual Therapy   Peter Congo, PT, DPT  08/09/2020, 12:19 PM

## 2020-08-09 NOTE — Telephone Encounter (Signed)
Pt daughter called and stated her mom is in the hospital and she will not be able to come in. She was wondering if her mom could still get her medication sent to the pharmacy.

## 2020-08-09 NOTE — Progress Notes (Signed)
Occupational Therapy Weekly Progress Note  Patient Details  Name: Nicole [name] MRN: [id number] Date of Birth: Nov 05, 1930  Beginning of progress report period: August 02, 2020 End of progress report period: August 09, 2020  Patient has met 2 of 4 short term goals.  Pt is making slow but steady progress with functional transfers and BADLs. Pt completes UB ADLs with min A and LB ADLs with mod A. Good carryover of hemi techniques at the moment. Squat pivot transfers with mod A. LUE with increased grasp/release, elbow flexion, and shoulder flexion and adduction/abduction and currently using LUE as stabilizer during BADLs. Patient continues to demonstrate the following deficits: muscle weakness, decreased cardiorespiratoy endurance, decreased coordination and decreased motor planning, decreased problem solving and decreased sitting balance, decreased standing balance and decreased balance strategies and therefore will continue to benefit from skilled OT intervention to enhance overall performance with BADL.  Patient progressing toward long term goals..  Continue plan of care.  OT Short Term Goals Week 2:  OT Short Term Goal 1 (Week 2): Pt will complete UB dressing with min A OT Short Term Goal 1 - Progress (Week 2): Progressing toward goal OT Short Term Goal 2 (Week 2): Pt will perform toilet transfers with mod A OT Short Term Goal 2 - Progress (Week 2): Met OT Short Term Goal 3 (Week 2): Pt will complete LB dressing tasks with max A with sit<>stand OT Short Term Goal 3 - Progress (Week 2): Met OT Short Term Goal 4 (Week 2): Pt will complete toileting tasks with max A OT Short Term Goal 4 - Progress (Week 2): Progressing toward goal Week 3:  OT Short Term Goal 1 (Week 3): STG=LTG secondary to ELOS   Leroy Libman 08/09/2020, 6:40 AM

## 2020-08-09 NOTE — Progress Notes (Signed)
[facility] PHYSICAL MEDICINE & REHABILITATION PROGRESS NOTE   Subjective/Complaints:  Denies pain- doing well- doesn't feel like has gotten a lot of return in L side- we went over, actually, she's gotten GREAT return compared to most patients.  .   ROS:   Pt denies SOB, abd pain, CP, N/V/C/D, and vision changes   Objective:   No results found. Recent Labs    08/08/20 0517  WBC 8.7  HGB 11.8*  HCT 37.0  PLT 203   Recent Labs    08/08/20 0517  NA 140  K 3.5  CL 108  CO2 22  GLUCOSE 95  BUN 16  CREATININE 0.87  CALCIUM 9.1    Intake/Output Summary (Last 24 hours) at 08/09/2020 1015 Last data filed at 08/09/2020 0727 Gross per 24 hour  Intake 597 ml  Output --  Net 597 ml        Physical Exam: Vital Signs Blood pressure (!) 163/77, pulse 76, temperature 98.3 F (36.8 C), temperature source Oral, resp. rate 18, height 5\' 5"  (1.651 m), weight 66.6 kg, SpO2 95 %. General: awake, alert, appropriate, sitting up in bedside chair, reading newspaper, NAD HEENT: conjugate gaze Heart: RRR Chest: CTA B/L- no W/R/R- good air movement Abdomen: Soft, NT, ND, (+)BS  Musculoskeletal:     Cervical back: L knee not TTP anymore,     Comments: RUE/RLE- 5-/5 in all muscles tested LUE biceps, WE, triceps, and grip 3/5 Skin:    General: Skin is warm and dry.     Comments: IV L antecubital fossa- looks good   Left heel not visualized today Neurological: Alert;  fair awareness of her deficits. Decreased sensation to light touch in LUE/LLE- facial sensation normal per pt   Assessment/Plan: 1. 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  Physiatrist is providing close team supervision and 24 hour management of active medical problems listed below.  Physiatrist and rehab team continue to assess barriers to discharge/monitor patient progress  toward functional and medical goals  Care Tool:  Bathing    Body parts bathed by patient: Chest, Abdomen, Face, Right upper leg, Left upper leg, Left arm, Right lower leg, Buttocks, Front perineal area   Body parts bathed by helper: Right arm, Buttocks, Right lower leg, Left lower leg     Bathing assist Assist Level: Moderate Assistance - Patient 50 - 74%     Upper Body Dressing/Undressing Upper body dressing   What is the patient wearing?: Bra, Pull over shirt    Upper body assist Assist Level: Moderate Assistance - Patient 50 - 74%    Lower Body Dressing/Undressing Lower body dressing      What is the patient wearing?: Pants, Underwear/pull up     Lower body assist Assist for lower body dressing: 2 Helpers     Toileting Toileting Toileting Activity did not occur (Clothing management and hygiene only): N/A (no void or bm)  Toileting assist Assist for toileting: Moderate Assistance - Patient 50 - 74%     Transfers Chair/bed transfer  Transfers assist  Chair/bed transfer activity did not occur: Refused  Chair/bed transfer assist level: Minimal Assistance - Patient > 75%     Locomotion Ambulation   Ambulation assist   Ambulation activity did not occur: Safety/medical concerns  Assist level: 2 helpers Assistive device: Parallel bars Max distance: 10'   Walk 10 feet activity   Assist  Walk 10 feet activity did not occur: Safety/medical concerns  Assist level: 2 helpers Assistive device: Parallel bars   Walk 50 feet activity   Assist Walk 50 feet with 2 turns activity did not occur: Safety/medical concerns         Walk 150 feet activity   Assist Walk 150 feet activity did not occur: Safety/medical concerns         Walk 10 feet on uneven surface  activity   Assist Walk 10 feet on uneven surfaces activity did not occur: Safety/medical concerns         Wheelchair     Assist Will patient use wheelchair at discharge?: Yes Type  of Wheelchair: Manual Wheelchair activity did not occur: Safety/medical concerns  Wheelchair assist level: Minimal Assistance - Patient > 75% Max wheelchair distance: 25'    Wheelchair 50 feet with 2 turns activity    Assist    Wheelchair 50 feet with 2 turns activity did not occur: Safety/medical concerns       Wheelchair 150 feet activity     Assist  Wheelchair 150 feet activity did not occur: Safety/medical concerns       Blood pressure (!) 163/77, pulse 76, temperature 98.3 F (36.8 C), temperature source Oral, resp. rate 18, height 5\' 5"  (1.651 m), weight 66.6 kg, SpO2 95 %.  Medical Problem List and Plan: 1.  Left-sided weakness/almost hemiplegia secondary to right basal ganglia and corona radiata infarct             -patient may  shower             -ELOS/Goals: 14-18 days- goals CGA- mod I   9/27- making strength exam gains every few days  -Continue CIR 2.  Antithrombotics: -DVT/anticoagulation: SCDs             -antiplatelet therapy: Aspirin 81 mg daily Plavix 85 mg daily and BMS investigational med 3. Pain Management: Tramadol as needed  9/22- still c/o L knee pain, but will con't Tramadol 50 mg q6 hours prn for now.  9/26- add voltaren gel to knee, use ice also  9/28: Ordered ice TID 15 minutes, voltaren gel increased to QID, Tylenol scheduled to q8H.  9/29: reported no pain in therapy today   10/1: denies pain. Stop tramadol.   10/2: denies pain. Change voltaren gel to PRN.    10/4- no pain in L knee/anywhere else.  10/5- denies pain  4. Mood: Provide emotional support             -antipsychotic agents: N/A 5. Neuropsych: This patient is capable of making decisions on her own behalf. 6. Skin/Wound Care: Routine skin checks 7. Fluids/Electrolytes/Nutrition: Routine in and outs with follow-up chemistries  9/22- labs look good on admission and were great on repeat on 9/27 8.  Hyperlipidemia.  Lipitor 80mg  daily 9.  Hypothyroidism.  Synthroid 10.   Asthma.  Continue nhaler as needed 11.  Overactive bladder.  Ditropan 12.  Constipation.  MiraLAX twice daily as well as Senokot 2 tablets twice daily.  9/26 had results with sorbitol, meds. Stools now loose   -hold miralax for now  10/1: moving bowels regularly. Change senna-docusate to HS PRN.  13.Glaucoma.pt says she doesn't take drops and doesn't have this dx.   9/22- will check with family.  14. L foot drop- will order PRAFO.  9/22- has been ordered- not here yet.  9/23- didn't see in room- will check into    9/30: in room, but patient  states she has not been wearing. Will place nursing order. 15. HTN-  10/2: Uncontrolled. Kidney function is normal Add Lisinopril 2.5mg .   10/3: Uncontrolled. Increase Lisinopril to 5mg .   10/4-BP better controlled- con't meds  10/5- BP 160s/70s this AM- had been doing better- will monitor before changing meds 16. Poor appetite  9/23- will check CMP in AM- to see total protein and albumin- and check a prealbumin- I'M concerned she's eating <25% of meals (so far only seen breakfast).   9/24- pt's prealbumin is 30 (18-38), Albumin is good at 3.9 and total protein good at 8.5- con't regimen 17. Hypokalemia  9/27- K+ 3.9  10/4- K+ 3.5- low normal-   10/5- will recheck Thursday  18. Impaired cognition: continue SLP 19. Left sided inattention: continue therapy, education.   LOS: 14 days A FACE TO FACE EVALUATION WAS PERFORMED  Arville Postlewaite 08/09/2020, 10:15 AM

## 2020-08-09 NOTE — Patient Care Conference (Signed)
Inpatient RehabilitationTeam Conference and Plan of Care Update Date: 08/09/2020   Time: 11:14 AM    Patient Name: Nicole [name]      Medical Record Number: [id number]  Date of Birth: [birth year] Sex: Female         Room/Bed: 4W04C/4W04C-01 Payor Info: Payor: [employer] MEDICARE / Plan: BCBS MEDICARE / Product Type: *No Product type* /    Admit Date/Time:  07/26/2020  5:45 PM  Primary Diagnosis:  Cerebrovascular accident (CVA) of right basal ganglia [facility])  Hospital Problems: Principal Problem:   Cerebrovascular accident (CVA) of right basal ganglia [facility])    Expected Discharge Date: Expected Discharge Date: 08/17/20  Team Members Present: Physician leading conference: Dr. Genice Rouge Care Coodinator Present: Kennyth Arnold, RN, BSN, CRRN;Cecile Sheerer, LCSWA Nurse Present: Greta Doom, RN PT Present: Peter Congo, PT OT Present: Roney Mans, OT;Ardis Rowan, COTA SLP Present: Colin Benton, SLP PPS Coordinator present : Edson Snowball, Park Breed, SLP     Current Status/Progress Goal Weekly Team Focus  Bowel/Bladder   Patient Continent x2 LBM 08/08/20  maintain continence  Q2 hr toileting and PRN   Swallow/Nutrition/ Hydration             ADL's   UB bathing Min A; LB bathing Mod A; UB dressing Min A; LB dressing Mod A  min A overall  BADL retraining, LUE NMR, ADL transfers, activity tolernace, safety awareness, education, dynamic sitting balance.   Mobility   mod A bed mobility, min A squat pivot transfer, min/mod sit to stand, gait 25 ft with RW mod A and close w/c follow, min A w/c mobility x 25 ft  min A overall, short distance gait, Supervision w/c mobility  LLE NMR, standing balance, gait training with RW, w/c mobility   Communication             Safety/Cognition/ Behavioral Observations  Supervision A  Supervision A  education and anticipatory awareness   Pain   Patient rating pain in  knee at 3/10  but pain is intermitent. no pain this  shift so far  maintian a pain goal of 3 or less  Assess Pain Q4 hours and PRN   Skin   skin intact no apparent breakdown  no new  skin breakdown  Assess skin Q shift and PRN     Discharge Planning:  Pt to d/c to home with her dtr [name]. She currently works a hybrid work schedule and is working towards hopefully being able to work from home to help assist her mother. Fam edu on Wed (10/6) 8am until completed with dtr.   Team Discussion: Continent B/B, no c/o pain. Min to mod assist with min assist goals. Lower body ADL's mod assist with min assist goals. SLP at baseline as long as patient has hearing amplifiers on.  Patient on target to meet rehab goals: yes  *See Care Plan and progress notes for long and short-term goals.   Revisions to Treatment Plan:  None at this time  Teaching Needs: Continue family education with daughter.  Current Barriers to Discharge: Decreased caregiver support, Home enviroment access/layout and Weight  Possible Resolutions to Barriers: Family education with daughter, continue daily medication regimen.     Medical Summary Current Status: BP 160s/70's-still working on BP control;  continent; LBM 10/4; skin is intact- L knee pain better  Barriers to Discharge: Decreased family/caregiver support;Home enviroment access/layout;Weight  Barriers to Discharge Comments: recovering from L hemiplegia- LUE 3 to 3-/5 now; d/c date-10/13;  at baseline for SLP with hearing amplifier Possible Resolutions to [employer]: 25 ft RW- might need AFO?; min A; OT- goals min A; LB mod A currently;  doing estim for LUE   Continued Need for Acute Rehabilitation Level of Care: The patient requires daily medical management by a physician with specialized training in physical medicine and rehabilitation for the following reasons: Direction of a multidisciplinary physical rehabilitation program to maximize functional independence : Yes Medical management of patient stability  for increased activity during participation in an intensive rehabilitation regime.: Yes Analysis of laboratory values and/or radiology reports with any subsequent need for medication adjustment and/or medical intervention. : Yes   I attest that I was present, lead the team conference, and concur with the assessment and plan of the team.   Tennis Must 08/09/2020, 4:39 PM

## 2020-08-09 NOTE — Progress Notes (Signed)
Patient ID: Nicole [name], female   DOB: 05/12/1930, 86 y.o.   MRN: [id number]  SW met with pt in room to confirm d/c date remains 10/13. Pt aware SW to follow-up with pt dtr [name]. SW left message for pt dtr [name] to provide updates from team conference, and reiterated family edu tomorrow. SW informed there will be follow-up to discuss d/c recommendations closer towards d/c.  Loralee Pacas, MSW, [place] Office: [phone] Cell: [phone] Fax: [phone]

## 2020-08-09 NOTE — Progress Notes (Signed)
Occupational Therapy Session Note  Patient Details  Name: Nicole [name] MRN: [id number] Date of Birth: 07-03-1930  Today's Date: 08/09/2020 OT Individual Time: 1130-1200 OT Individual Time Calculation (min): 30 min    Short Term Goals: Week 2:  OT Short Term Goal 1 (Week 2): Pt will complete UB dressing with min A OT Short Term Goal 1 - Progress (Week 2): Progressing toward goal OT Short Term Goal 2 (Week 2): Pt will perform toilet transfers with mod A OT Short Term Goal 2 - Progress (Week 2): Met OT Short Term Goal 3 (Week 2): Pt will complete LB dressing tasks with max A with sit<>stand OT Short Term Goal 3 - Progress (Week 2): Met OT Short Term Goal 4 (Week 2): Pt will complete toileting tasks with max A OT Short Term Goal 4 - Progress (Week 2): Progressing toward goal  Skilled Therapeutic Interventions/Progress Updates:    Pt resting in w/c upon arrival.  OT intervention with focus on LUE NMR and functional activities. See below for NMR. Pt now able to touch her nose with LUE when elbow supported. LUE shoulder horizontal adduction without assistance when seated. Reaching for object with elbow supported and focus on shoulder flexion and elbow extension in addition to grasp/release of object. Pt remained seated in w/c with all needs within reach and belt alarm activated.   Therapy Documentation Precautions:  Precautions Precautions: Fall Precaution Comments: Left side weakness. Inattention to L Restrictions Weight Bearing Restrictions: No  Pain: Pain Assessment Pain Scale: 0-10 Pain Score: 0-No pain   Other Treatments: Treatments Neuromuscular Facilitation: Left;Upper Extremity;Activity to increase motor control;Activity to increase timing and sequencing;Activity to increase sustained activation Weight Bearing Technique Weight Bearing Technique: Yes LUE Weight Bearing Technique: Forearm seated;Extended arm seated Response to Weight Bearing Technique: increase in elbow  flexion/extension, shoulder horizontal adduction/abduction; grasp release and wrist flexin/extension   Therapy/Group: Individual Therapy  Leroy Libman 08/09/2020, 12:03 PM

## 2020-08-09 NOTE — Progress Notes (Signed)
Occupational Therapy Session Note  Patient Details  Name: Nicole [name] MRN: [id number] Date of Birth: 20-Sep-1930  Today's Date: 08/09/2020 OT Individual Time: 9432-7614 OT Individual Time Calculation (min): 75 min    Short Term Goals: Week 3:  OT Short Term Goal 1 (Week 3): STG=LTG secondary to ELOS  Skilled Therapeutic Interventions/Progress Updates:    Pt received supine with no c/o pain, agreeable to OT session focused on morning ADLs. Pt completed bed mobility (supine to sidelying to sit) with CGA, then requiring min A to scoot L hip to EOB and min cueing. Min cueing for UE placement during sit > stand from EOB with RW use and L orthosis, mod A to power up. Pt completed stand pivot with mod A, by end of transfer B knee flexion with L knee buckling. Pt guided through transfer with mod cueing and encouragement. Pt completed oral care with good bimanual use to apply toothpaste to toothbrush and (S). Pt completed UB bathing with min facilitation for L HOH/ proximal support for LUE to reach across midline to wash RUE. Pt trialed using wash mit on her LUE and was successful to wash under RUE with this. Pt completed 3x sit <> stands with min A at the sink, min cueing for UE placement. Pt washed peri areas in standing with min A for balance support. Pants donned with no cueing required for hemi method, mod A overall to thread over LLE and to pull up over L hip. Pt donned bra with mod A and shirt with min A. Pt was given rest break and then brought to the gym via w/c. Pt completed seated LUE NMR focused on 1st and 2nd finger lateral pinch using large coins. Pt required support at the elbow and wrist to provide stability needed to maximize distal mobility. Pt then completed standing forward reaching IADL task of washing mirror to address increased wrist extension and shoulder flexion/scaption. Pt required mod facilitation and fatigued quickly during this activity. Pt was returned to her room and left sitting  up with all needs met, chair alarm set.   Therapy Documentation Precautions:  Precautions Precautions: Fall Precaution Comments: Left side weakness. Inattention to L Restrictions Weight Bearing Restrictions: No   Therapy/Group: Individual Therapy  Curtis Sites 08/09/2020, 7:07 AM

## 2020-08-10 ENCOUNTER — Ambulatory Visit (HOSPITAL_COMMUNITY): Payer: Medicare Other | Admitting: Physical Therapy

## 2020-08-10 ENCOUNTER — Encounter (HOSPITAL_COMMUNITY): Payer: Medicare Other

## 2020-08-10 ENCOUNTER — Inpatient Hospital Stay (HOSPITAL_COMMUNITY): Payer: Medicare Other

## 2020-08-10 NOTE — Telephone Encounter (Signed)
Thanks

## 2020-08-10 NOTE — Progress Notes (Signed)
Occupational Therapy Session Note  Patient Details  Name: Nicole [name] MRN: [id number] Date of Birth: 04/02/1930  Today's Date: 08/10/2020 OT Individual Time: 1130-1200 OT Individual Time Calculation (min): 30 min  and Today's Date: 08/10/2020 OT Missed Time: 15 Minutes Missed Time Reason: Other (comment) (testing)   Short Term Goals: Week 2:  OT Short Term Goal 1 (Week 2): Pt will complete UB dressing with min A OT Short Term Goal 1 - Progress (Week 2): Progressing toward goal OT Short Term Goal 2 (Week 2): Pt will perform toilet transfers with mod A OT Short Term Goal 2 - Progress (Week 2): Met OT Short Term Goal 3 (Week 2): Pt will complete LB dressing tasks with max A with sit<>stand OT Short Term Goal 3 - Progress (Week 2): Met OT Short Term Goal 4 (Week 2): Pt will complete toileting tasks with max A OT Short Term Goal 4 - Progress (Week 2): Progressing toward goal  Skilled Therapeutic Interventions/Progress Updates:    Pt resting in w/c upon arrival with daughter present and staff completing testing for research study. Pt missed 15 mins skilled OT services.  OT intervention with focus on education. Discussed DME recommendations. [name] ordered TTB and suction grab bars from [employer].  Practiced TTB transfers with min A. Pt returned to room and remained in w/c with belt alarm activated and all needs within reach.   Therapy Documentation Precautions:  Precautions Precautions: Fall Precaution Comments: Left side weakness. Inattention to L Restrictions Weight Bearing Restrictions: No General: General OT Amount of Missed Time: 15 Minutes Vital Signs:  Pain: Pain Assessment Pain Score: 0-No pain   Therapy/Group: Individual Therapy  Lanier, Thomas Chappell 08/10/2020, 12:23 PM 

## 2020-08-10 NOTE — Telephone Encounter (Signed)
Patient daughter stated that she is hospitalized and that she will not be able to return in the office right now. Just wanted you to know. She will follow up as soon as she gets better.

## 2020-08-10 NOTE — Progress Notes (Signed)
Occupational Therapy Session Note  Patient Details  Name: Nicole [name] MRN: [id number] Date of Birth: 1929/11/19  Today's Date: 08/10/2020 OT Individual Time: 7793-9030 OT Individual Time Calculation (min): 45 min   Session 2: OT Individual Time: 0923-3007 OT Individual Time Calculation (min): 43 min    Short Term Goals: Week 3:  OT Short Term Goal 1 (Week 3): STG=LTG secondary to ELOS  Skilled Therapeutic Interventions/Progress Updates:    Pt received supine with her daughter present for family education session. Her daughter had several questions re OT f/u, equipment to be ordered, and home adaptations. All were answered thoroughly. Recommending TTB and BSC for home use- also showed daughter bed rail and hand held shower head to purchase on her own. She also reports her two sons will likely carry pt upstairs instead of pt navigating stairs- will allow PT to address stair training. Pt completed bed mobility from flat bed with (S), heavy use of bed rail. Pt completed stand pivot transfer with mod A to the w/c with use of RW, several instance of buckling/instability at the L knee. Pt completed UB bathing at the sink with min A to don wash mit on the L UE and for facilitation at the elbow to encourage full cross body reaching. Discussed NMR and CVA recovery throughout session. Pt stood with min A to complete peri hygiene. Mod A to don pants- would likely benefit from reacher to assist. Pt was left sitting up in the w/c with her daughter assisting with hair care.   Session 2:  Pt recevied supine with no c/o pain. Daughter arriving to observe session. Pt completed bed mobility with (S) to EOB. Stand pivot transfer with RW with mod A, heavy L knee buckling. Pt was taken to the therapy gym for LUE NMR activities. Pt completed guided gross grasp and release with cueing and tactile cueing for technique and full extension at the 3-4 fingers especially. Pt completed AAROM elbow flex and ext while holding a  cup to simulate self feeding- encouraged increased supination at end range of extension and required mod facilitation. Pt was guided through scapular retraction/protraction exercises and trap elevation/depression. Demo provided for self- PROM to promote muscle length. Pt returned to her room and completed toileting tasks with mod A overall, including stand pivot transfer with use of grab bar. Pt was left sitting up in the w/c with her daughter present.   Therapy Documentation Precautions:  Precautions Precautions: Fall Precaution Comments: Left side weakness. Inattention to L Restrictions Weight Bearing Restrictions: No  Therapy/Group: Individual Therapy  Crissie Reese 08/10/2020, 7:29 AM

## 2020-08-10 NOTE — Progress Notes (Addendum)
Patient ID: Nicole [name], female   DOB: [birth year], 86 y.o.   MRN: [id number]  Candis Musa daughter who is here for family education and gave her a Home health list to choose from. Daughter is interested in meals that the insurance will provide will try to get the number for her.  3:40 PM Daughter went ahead and bought a tub bench and daughter has chosen [facility] for her follow up

## 2020-08-10 NOTE — Progress Notes (Signed)
PHYSICAL MEDICINE & REHABILITATION PROGRESS NOTE   Subjective/Complaints:  Pt reports doing well- just went to bathroom/voided- L knee doesn't hurt "still".   Ready for family education today- daughter coming in.   ROS:   Pt denies SOB, abd pain, CP, N/V/C/D, and vision changes   Objective:   No results found. Recent Labs    08/08/20 0517  WBC 8.7  HGB 11.8*  HCT 37.0  PLT 203   Recent Labs    08/08/20 0517  NA 140  K 3.5  CL 108  CO2 22  GLUCOSE 95  BUN 16  CREATININE 0.87  CALCIUM 9.1    Intake/Output Summary (Last 24 hours) at 08/10/2020 0856 Last data filed at 08/10/2020 0809 Gross per 24 hour  Intake 655 ml  Output --  Net 655 ml        Physical Exam: Vital Signs Blood pressure (!) 151/71, pulse 86, temperature 97.8 F (36.6 C), temperature source Oral, resp. rate 18, height 5\' 5"  (1.651 m), weight 66.6 kg, SpO2 96 %. General: sitting up on Cera steady, being transferred from toilet to bed by nurse, NAD HEENT: conjugate gaze Heart: RRR Chest: CTA B/L- no W/R/R- good air movement Abdomen: Soft, NT, ND, (+)BS  Musculoskeletal:     Cervical back: L knee not TTP anymore- no swelling,     Comments: RUE/RLE- 5-/5 in all muscles tested LUE biceps, WE, triceps, and grip 3/5 Skin:    General: Skin is warm and dry.     Comments: IV L antecubital fossa- looks good   Left heel not visualized today Neurological: Alert;  fair awareness of her deficits. Decreased sensation to light touch in LUE/LLE- facial sensation normal per pt   Assessment/Plan: 1. 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  Physiatrist is providing close team supervision and 24 hour management of active medical problems listed below.  Physiatrist and rehab team continue to assess barriers to discharge/monitor patient progress toward  functional and medical goals  Care Tool:  Bathing    Body parts bathed by patient: Chest, Abdomen, Face, Right upper leg, Left upper leg, Left arm, Right lower leg, Buttocks, Front perineal area   Body parts bathed by helper: Right arm, Left lower leg     Bathing assist Assist Level: Minimal Assistance - Patient > 75%     Upper Body Dressing/Undressing Upper body dressing   What is the patient wearing?: Bra, Pull over shirt    Upper body assist Assist Level: Moderate Assistance - Patient 50 - 74%    Lower Body Dressing/Undressing Lower body dressing      What is the patient wearing?: Pants, Underwear/pull up     Lower body assist Assist for lower body dressing: Moderate Assistance - Patient 50 - 74%     Toileting Toileting Toileting Activity did not occur (Clothing management and hygiene only): N/A (no void or bm)  Toileting assist Assist for toileting: Moderate Assistance - Patient 50 - 74%     Transfers Chair/bed transfer  Transfers assist  Chair/bed transfer activity did not occur: Refused  Chair/bed transfer assist level: Minimal Assistance - Patient > 75%     Locomotion Ambulation   Ambulation assist   Ambulation activity did not occur: Safety/medical concerns  Assist level: 2 helpers Assistive device: Walker-rolling Max distance: 28'   Walk 10 feet activity   Assist  Walk 10  feet activity did not occur: Safety/medical concerns  Assist level: 2 helpers Assistive device: Walker-rolling   Walk 50 feet activity   Assist Walk 50 feet with 2 turns activity did not occur: Safety/medical concerns         Walk 150 feet activity   Assist Walk 150 feet activity did not occur: Safety/medical concerns         Walk 10 feet on uneven surface  activity   Assist Walk 10 feet on uneven surfaces activity did not occur: Safety/medical concerns         Wheelchair     Assist Will patient use wheelchair at discharge?: Yes Type of  Wheelchair: Manual Wheelchair activity did not occur: Safety/medical concerns  Wheelchair assist level: Contact Guard/Touching assist Max wheelchair distance: 100'    Wheelchair 50 feet with 2 turns activity    Assist    Wheelchair 50 feet with 2 turns activity did not occur: Safety/medical concerns   Assist Level: Contact Guard/Touching assist   Wheelchair 150 feet activity     Assist  Wheelchair 150 feet activity did not occur: Safety/medical concerns       Blood pressure (!) 151/71, pulse 86, temperature 97.8 F (36.6 C), temperature source Oral, resp. rate 18, height 5\' 5"  (1.651 m), weight 66.6 kg, SpO2 96 %.  Medical Problem List and Plan: 1.  Left-sided weakness/almost hemiplegia secondary to right basal ganglia and corona radiata infarct             -patient may  shower             -ELOS/Goals: 14-18 days- goals CGA- mod I   9/27- making strength exam gains every few days  -Continue CIR 2.  Antithrombotics: -DVT/anticoagulation: SCDs             -antiplatelet therapy: Aspirin 81 mg daily Plavix 85 mg daily and BMS investigational med 3. Pain Management: Tramadol as needed   10/1: denies pain. Stop tramadol.   10/2: denies pain. Change voltaren gel to PRN.    10/4- no pain in L knee/anywhere else.  10/6- denies pain in L knee 4. Mood: Provide emotional support             -antipsychotic agents: N/A 5. Neuropsych: This patient is capable of making decisions on her own behalf. 6. Skin/Wound Care: Routine skin checks 7. Fluids/Electrolytes/Nutrition: Routine in and outs with follow-up chemistries  9/22- labs look good on admission and were great on repeat on 9/27 8.  Hyperlipidemia.  Lipitor 80mg  daily 9.  Hypothyroidism.  Synthroid 10.  Asthma.  Continue nhaler as needed 11.  Overactive bladder.  Ditropan 12.  Constipation.  MiraLAX twice daily as well as Senokot 2 tablets twice daily.  9/26 had results with sorbitol, meds. Stools now loose   -hold  miralax for now  10/1: moving bowels regularly. Change senna-docusate to HS PRN.  13.Glaucoma.pt says she doesn't take drops and doesn't have this dx.   9/22- will check with family.  14. L foot drop- will order PRAFO.  9/22- has been ordered- not here yet.  9/23- didn't see in room- will check into    9/30: in room, but patient states she has not been wearing. Will place nursing order. 15. HTN-  10/2: Uncontrolled. Kidney function is normal Add Lisinopril 2.5mg .   10/3: Uncontrolled. Increase Lisinopril to 5mg .   10/4-BP better controlled- con't meds  10/5- BP 160s/70s this AM- had been doing better- will monitor before changing meds  10/6- BP 151/71 this AM 16. Poor appetite  9/23- will check CMP in AM- to see total protein and albumin- and check a prealbumin- I'M concerned she's eating <25% of meals (so far only seen breakfast).   9/24- pt's prealbumin is 30 (18-38), Albumin is good at 3.9 and total protein good at 8.5- con't regimen 17. Hypokalemia  9/27- K+ 3.9  10/4- K+ 3.5- low normal-   10/6- K+ recheck Thursday  18. Impaired cognition: continue SLP 19. Left sided inattention: continue therapy, education.   LOS: 15 days A FACE TO FACE EVALUATION WAS PERFORMED  Nicole [name] 08/10/2020, 8:56 AM

## 2020-08-10 NOTE — Progress Notes (Signed)
Physical Therapy Session Note  Patient Details  Name: Nicole [name] MRN: [id number] Date of Birth: 1930/04/12  Today's Date: 08/10/2020 PT Individual Time: 1000-1045 PT Individual Time Calculation (min): 45 min   Short Term Goals: Week 2:  PT Short Term Goal 1 (Week 2): pt to demonstrate gait training with LRAD 50' min A consistently PT Short Term Goal 2 (Week 2): pt to demo static sitting balance without trunk support at CGA for 10 mins PT Short Term Goal 3 (Week 2): pt to demo dynamic standing balance CGA PT Short Term Goal 4 (Week 2): pt to mobilize [facility] for 75' min A  Skilled Therapeutic Interventions/Progress Updates:    Pt received seated in w/c in room, agreeable to PT session. No complaints of pain. [name] [name] present to observe pt's current level of function. Discussed home setup and provided home measurement sheet for daughter to complete. Also discussed that pt is making progress with gait but will likely utilize w/c at home for longer distances. Discussed need for larger tennis shoes to trial brace with gait, daughter to obtain. Manual w/c propulsion x 100 ft with use of hemi-technique with R UE/LE at Supervision level. Pt exhibits improved steering and obstacle avoidance this date. Sit to stand with min A to RW with L hand orthosis. Ambulation 2 x 15 ft with RW and mod A overall with close w/c follow for safety, assist for increasing BOS with LLE, some assist to advance LLE, and L knee blocked in stance. Pt also requires assist for lateral weight shift to the L during stance. [name] pleased with progress so far, will return next week for hands-on training. Pt left seated in w/c in room with needs in reach, daughter present at end of session.  Therapy Documentation Precautions:  Precautions Precautions: Fall Precaution Comments: Left side weakness. Inattention to L Restrictions Weight Bearing Restrictions: No   Therapy/Group: Individual Therapy   Peter Congo,  PT, DPT  08/10/2020, 12:33 PM

## 2020-08-10 NOTE — Progress Notes (Signed)
Speech Language Pathology Discharge Summary  Patient Details  Name: Nicole [name] MRN: [id number] Date of Birth: 09-Aug-1930  Today's Date: 08/10/2020 SLP Individual Time: 3299-2426 SLP Individual Time Calculation (min): 42 min   Skilled Therapeutic Interventions:   Skilled ST services focused on education and cognitive skills. SLP facilitated higher level problem solving skills with BID pill organizer task, pt required supervision A verbal cues for errors with ability to correct mod I. SLP also facilitated anticipatory awareness given safety situation pertaining to novel deficits at discharge pt demonstrated mod I. [name] entered room the last 7 minutes of session. Roslyn confirmed cognitive baseline and supports increase noted deficits to novel tasks and hearing impairment without hearing aids present and she agreed to provide supervision A during higher level problem solving task such as money/medication/time management. Pt and Roslyn agreed with early ST discharge and all education was completed. Pt was left in room with daughter, call bell within reach and bed alarm set.     Patient has met 4 of 4 long term goals.  Patient to discharge at overall Supervision level.  Reasons goals not met:     Clinical Impression/Discharge Summary:   Pt made great progress meeting 4 out 4 goals discharging at supervision A - Mod I for anticipatory awareness, recall and complex problem solving. Once hearing amplifier was utilized with pt (hearing aids not present in hospital), pt demonstrated dramatic improve in following commands, complex, problem solving and recall. Pt completed medication/money/time management. SLP suspect pt will continue to improve in familiar task/setting and with hearing aids in place. Education was completed with pt's daughter, whom she lives with and supports pt being at cognitive baseline. SLP ended services following education and no further ST services needed at this time.    Care Partner:  Caregiver Able to Provide Assistance: Yes  Type of Caregiver Assistance: Cognitive  Recommendation:  None      Equipment: N/A   Reasons for discharge: Treatment goals met   Patient/Family Agrees with Progress Made and Goals Achieved: Yes (daughter)    Nashaly Dorantes  [facility] 08/10/2020, 10:45 AM

## 2020-08-11 ENCOUNTER — Inpatient Hospital Stay (HOSPITAL_COMMUNITY): Payer: Medicare Other | Admitting: Occupational Therapy

## 2020-08-11 ENCOUNTER — Ambulatory Visit: Payer: Medicare Other | Admitting: Sports Medicine

## 2020-08-11 ENCOUNTER — Inpatient Hospital Stay (HOSPITAL_COMMUNITY): Payer: Medicare Other

## 2020-08-11 LAB — BASIC METABOLIC PANEL
Anion gap: 11 (ref 5–15)
BUN: 13 mg/dL (ref 8–23)
CO2: 23 mmol/L (ref 22–32)
Calcium: 9.3 mg/dL (ref 8.9–10.3)
Chloride: 109 mmol/L (ref 98–111)
Creatinine, Ser: 0.98 mg/dL (ref 0.44–1.00)
GFR calc non Af Amer: 52 mL/min — ABNORMAL LOW (ref >60)
Glucose, Bld: 96 mg/dL (ref 70–99)
Potassium: 3.6 mmol/L (ref 3.5–5.1)
Sodium: 143 mmol/L (ref 135–145)

## 2020-08-11 NOTE — Progress Notes (Signed)
PHYSICAL MEDICINE & REHABILITATION PROGRESS NOTE   Subjective/Complaints:  Pt reports LBM this AM- feels "good".   Spoke with OT about possible Amantadine for lack of using L side, in spite of getting return-they will get back to me.    ROS:   Pt denies SOB, abd pain, CP, N/V/C/D, and vision changes   Objective:   No results found. No results for input(s): WBC, HGB, HCT, PLT in the last 72 hours. Recent Labs    08/11/20 0549  NA 143  K 3.6  CL 109  CO2 23  GLUCOSE 96  BUN 13  CREATININE 0.98  CALCIUM 9.3    Intake/Output Summary (Last 24 hours) at 08/11/2020 0914 Last data filed at 08/10/2020 1900 Gross per 24 hour  Intake 474 ml  Output --  Net 474 ml        Physical Exam: Vital Signs Blood pressure (!) 147/79, pulse 68, temperature 97.9 F (36.6 C), resp. rate 18, height 5\' 5"  (1.651 m), weight 66.6 kg, SpO2 97 %. General: sitting up in bed, EOB, with OT in room, NAD HEENT: conjugate gaze Heart: RRR Chest: CTA B/L- no W/R/R- good air movement Abdomen: Soft, NT, ND, (+)BS  Musculoskeletal:     Cervical back: L knee not TTP anymore- no swelling,     Comments: RUE/RLE- 5-/5 in all muscles tested LUE biceps, WE, triceps, and grip 3/5 Skin:    General: Skin is warm and dry.     Comments: IV L antecubital fossa- looks good   Left heel not visualized today Neurological: Alert;  fair awareness of her deficits. Decreased sensation to light touch in LUE/LLE- facial sensation normal per pt Not using L side unless forced to.   Assessment/Plan: 1. 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  Physiatrist is providing close team supervision and 24 hour management of active medical problems listed below.  Physiatrist and rehab team continue to assess barriers to discharge/monitor patient progress toward functional and  medical goals  Care Tool:  Bathing    Body parts bathed by patient: Chest, Abdomen, Face, Right upper leg, Left upper leg, Left arm, Right lower leg, Buttocks, Front perineal area, Left lower leg, Right arm   Body parts bathed by helper: Right arm, Left lower leg     Bathing assist Assist Level: Minimal Assistance - Patient > 75%     Upper Body Dressing/Undressing Upper body dressing   What is the patient wearing?: Bra, Pull over shirt    Upper body assist Assist Level: Moderate Assistance - Patient 50 - 74%    Lower Body Dressing/Undressing Lower body dressing      What is the patient wearing?: Pants, Underwear/pull up     Lower body assist Assist for lower body dressing: Moderate Assistance - Patient 50 - 74%     Toileting Toileting Toileting Activity did not occur (Clothing management and hygiene only): N/A (no void or bm)  Toileting assist Assist for toileting: Moderate Assistance - Patient 50 - 74%     Transfers Chair/bed transfer  Transfers assist  Chair/bed transfer activity did not occur: Refused  Chair/bed transfer assist level: Moderate Assistance - Patient 50 - 74%     Locomotion Ambulation   Ambulation assist   Ambulation activity did not occur: Safety/medical concerns  Assist level: 2 helpers Assistive device: Walker-rolling Max distance: 15'   Walk 10 feet  activity   Assist  Walk 10 feet activity did not occur: Safety/medical concerns  Assist level: 2 helpers Assistive device: Walker-rolling   Walk 50 feet activity   Assist Walk 50 feet with 2 turns activity did not occur: Safety/medical concerns         Walk 150 feet activity   Assist Walk 150 feet activity did not occur: Safety/medical concerns         Walk 10 feet on uneven surface  activity   Assist Walk 10 feet on uneven surfaces activity did not occur: Safety/medical concerns         Wheelchair     Assist Will patient use wheelchair at discharge?:  Yes Type of Wheelchair: Manual Wheelchair activity did not occur: Safety/medical concerns  Wheelchair assist level: Supervision/Verbal cueing Max wheelchair distance: 100'    Wheelchair 50 feet with 2 turns activity    Assist    Wheelchair 50 feet with 2 turns activity did not occur: Safety/medical concerns   Assist Level: Contact Guard/Touching assist   Wheelchair 150 feet activity     Assist  Wheelchair 150 feet activity did not occur: Safety/medical concerns       Blood pressure (!) 147/79, pulse 68, temperature 97.9 F (36.6 C), resp. rate 18, height 5\' 5"  (1.651 m), weight 66.6 kg, SpO2 97 %.  Medical Problem List and Plan: 1.  Left-sided weakness/almost hemiplegia secondary to right basal ganglia and corona radiata infarct             -patient may  shower             -ELOS/Goals: 14-18 days- goals CGA- mod I   9/27- making strength exam gains every few days  10/7- making gains, HOWEVER not using due to neglect- will d/w OT possibly starting Amantadine.   -Continue CIR 2.  Antithrombotics: -DVT/anticoagulation: SCDs             -antiplatelet therapy: Aspirin 81 mg daily Plavix 85 mg daily and BMS investigational med 3. Pain Management: Tramadol as needed   10/1: denies pain. Stop tramadol.   10/2: denies pain. Change voltaren gel to PRN.    10/4- no pain in L knee/anywhere else.  10/6- denies pain in L knee 4. Mood: Provide emotional support             -antipsychotic agents: N/A 5. Neuropsych: This patient is capable of making decisions on her own behalf. 6. Skin/Wound Care: Routine skin checks 7. Fluids/Electrolytes/Nutrition: Routine in and outs with follow-up chemistries  9/22- labs look good on admission and were great on repeat on 9/27 8.  Hyperlipidemia.  Lipitor 80mg  daily 9.  Hypothyroidism.  Synthroid 10.  Asthma.  Continue nhaler as needed 11.  Overactive bladder.  Ditropan 12.  Constipation.  MiraLAX twice daily as well as Senokot 2 tablets  twice daily.  9/26 had results with sorbitol, meds. Stools now loose   -hold miralax for now  10/1: moving bowels regularly. Change senna-docusate to HS PRN.   10/7- Bowels - LBm this AM 13.Glaucoma.pt says she doesn't take drops and doesn't have this dx.   9/22- will check with family.  14. L foot drop- will order PRAFO.  9/22- has been ordered- not here yet.  9/23- didn't see in room- will check into    9/30: in room, but patient states she has not been wearing. Will place nursing order. 15. HTN-  10/2: Uncontrolled. Kidney function is normal Add Lisinopril 2.5mg .   10/3: Uncontrolled.  Increase Lisinopril to 5mg .   10/4-BP better controlled- con't meds  10/5- BP 160s/70s this AM- had been doing better- will monitor before changing meds  10/6- BP 151/71 this AM  10/7- BP 140s/70s- con't regimen 16. Poor appetite  9/23- will check CMP in AM- to see total protein and albumin- and check a prealbumin- I'M concerned she's eating <25% of meals (so far only seen breakfast).   9/24- pt's prealbumin is 30 (18-38), Albumin is good at 3.9 and total protein good at 8.5- con't regimen 17. Hypokalemia  9/27- K+ 3.9  10/4- K+ 3.5- low normal-   10/6- K+ recheck Thursday  18. Impaired cognition: continue SLP 19. Left sided inattention: continue therapy, education.  10/7-= think about amantadine.    LOS: 16 days A FACE TO FACE EVALUATION WAS PERFORMED  Nicole [name] 08/11/2020, 9:14 AM

## 2020-08-11 NOTE — Progress Notes (Signed)
Occupational Therapy Session Note  Patient Details  Name: Nicole [name] MRN: [id number] Date of Birth: May 31, 1930  Today's Date: 08/11/2020 OT Individual Time: 0289-0228 OT Individual Time Calculation (min): 60 min    Short Term Goals: Week 3:  OT Short Term Goal 1 (Week 3): STG=LTG secondary to ELOS  Skilled Therapeutic Interventions/Progress Updates:  Patient met lying supine in bed in agreement with OT treatment session with focus on self-care re-education, functional transfers, and RUE NMR as detailed below. Supine to EOB with supervision A and squat-pivot transfer to wc with Mod A. Walk-in shower transfer from wc to shower chair via squat-pivot with cues for hand placement. Patient able to bathe UB with use of wash mit and cues for incorporation of LUE. LB bathing in sitting with assist to attain and maintain figure-4 position. Seated EOB, patient able to don UB clothing with Mod A to don bra. Use of hemi technique to don shirt. Patient continues to require Mod A to thread BLE through LB clothing and hike over hips in standing with cues for posture and orientation to midline. Session concluded with patient seated in wc with belt alarm on and call bel within reach.   Therapy Documentation Precautions:  Precautions Precautions: Fall Precaution Comments: Left side weakness. Inattention to L Restrictions Weight Bearing Restrictions: No General:    Therapy/Group: Individual Therapy  [name] 08/11/2020, 7:27 AM

## 2020-08-11 NOTE — Progress Notes (Signed)
Occupational Therapy Session Note  Patient Details  Name: Nicole [name] MRN: [id number] Date of Birth: 14-Aug-1930  Today's Date: 08/11/2020 OT Individual Time: 1115-1200 OT Individual Time Calculation (min): 45 min    Short Term Goals: Week 2:  OT Short Term Goal 1 (Week 2): Pt will complete UB dressing with min A OT Short Term Goal 1 - Progress (Week 2): Progressing toward goal OT Short Term Goal 2 (Week 2): Pt will perform toilet transfers with mod A OT Short Term Goal 2 - Progress (Week 2): Met OT Short Term Goal 3 (Week 2): Pt will complete LB dressing tasks with max A with sit<>stand OT Short Term Goal 3 - Progress (Week 2): Met OT Short Term Goal 4 (Week 2): Pt will complete toileting tasks with max A OT Short Term Goal 4 - Progress (Week 2): Progressing toward goal  Skilled Therapeutic Interventions/Progress Updates:    Pt resting in w/c upon arrival  OT intervention with focus on functional tranfsers, sit<>stand, standing balance, LUE function, and activity tolerance to increase independence with BADLs. Squat pivot transfer with mod A. Sit<>stand X 5 with mod A. Min A for static standing balance. Pt practiced reaching for cup and grasping cup to bring to mouth. Pt required min A for eliminating gravity.  Pt able to initiate picking up cup but required the assistance when moving and bringing to mouth at end range. Pt returned to room and remained in w/c with belt alarm activated and all needs within reach.   Therapy Documentation Precautions:  Precautions Precautions: Fall Precaution Comments: Left side weakness. Inattention to L Restrictions Weight Bearing Restrictions: No  Pain: Pain Assessment Pain Scale: 0-10 Pain Score: 0-No pain   Therapy/Group: Individual Therapy  Leroy Libman 08/11/2020, 12:21 PM

## 2020-08-11 NOTE — Progress Notes (Signed)
Physical Therapy Session Note  Patient Details  Name: Nicole [name] MRN: [id number] Date of Birth: 05/25/1930  Today's Date: 08/11/2020 PT Individual Time: 1300-1355 + 1515-1600 PT Individual Time Calculation (min): 55 min  + 45 min  Short Term Goals: Week 2:  PT Short Term Goal 1 (Week 2): pt to demonstrate gait training with LRAD 50' min A consistently PT Short Term Goal 2 (Week 2): pt to demo static sitting balance without trunk support at CGA for 10 mins PT Short Term Goal 3 (Week 2): pt to demo dynamic standing balance CGA PT Short Term Goal 4 (Week 2): pt to mobilize [facility] for 75' min A  Skilled Therapeutic Interventions/Progress Updates:     1st session: Pt received sitting in w/c, awake and agreeable to PT session; denies any pain. Focus of session to continue gait training and standing balance. WC transport for time management from her room to dayroom gym. Performed interval gait training, requiring extended seat rest breaks b/w trials 2/2 fatigue. Performed sit<>stands with minA +2 from w/c to RW, assist for LUE placement to RW orthosis. She ambulated 62ft + 10ft + 78ft + 52ft with min/modA +2 and RW. Therapist assisting with L foot advancement/placement, L knee block, and RW management. Mirror placed in front of her to assist with visual feedback for postural awareness. Also performed static standing L hip marches, 2x10 focusing on L hip/knee flexion and standing balance. Pt reporting moderate fatigue, requesting to return to bed to rest before next therapy session. WC transport for time management back to her room, stand<pivot with modA and no AD from w/c to EOB. MinA for sit>Supine for trunk control and BLE management. She ended session semi-reclined in bed, 3/4 bed rails up, bed alarm on, needs in reach, made comfortable.  2nd session: Pt received sitting in w/c, agreeable to PT session; denies any pain. She self propelled her self ~34ft with mostly supervision (requires minA for  turning and maintaining straight path as she veers L) via hemi-technique in manual w/c on level surfaces. She ambulated 25ft with min/modA +2 and RW with LUE orthosis. PT assisting with LLE advancement, L foot placement, lateral weight shifting, and RW management. After seated rest, performed sit<>stands from lowered mat table height and reaching for horseshoe with RUE on ipsilateral side to place on rim of basketball goal on contraleteral (L) side. She performed this x8 reps, requiring min/modA for standing balance. Pt reporting moderate fatigue and requesting to return to bed. Therefore, stand<>pivot with modA back to her w/c and dependent WC transport back to her room. Stand<>pivot with modA from w/c to EOB, minA for sit>supine for trunk control and BLE management. Ended session semi-reclined in bed, bed alarm on, needs in reach.  Therapy Documentation Precautions:  Precautions Precautions: Fall Precaution Comments: Left side weakness. Inattention to L Restrictions Weight Bearing Restrictions: No  Therapy/Group: Individual Therapy  Devonne Kitchen P Jadesola Poynter PT 08/11/2020, 7:59 AM

## 2020-08-12 ENCOUNTER — Inpatient Hospital Stay (HOSPITAL_COMMUNITY): Payer: Medicare Other

## 2020-08-12 ENCOUNTER — Inpatient Hospital Stay (HOSPITAL_COMMUNITY): Payer: Medicare Other | Admitting: Physical Therapy

## 2020-08-12 ENCOUNTER — Inpatient Hospital Stay (HOSPITAL_COMMUNITY): Payer: Medicare Other | Admitting: Occupational Therapy

## 2020-08-12 NOTE — Progress Notes (Signed)
Occupational Therapy Session Note  Patient Details  Name: Nicole [name] MRN: [id number] Date of Birth: 11-30-1929  Today's Date: 08/12/2020 OT Individual Time: 0910-1000 OT Individual Time Calculation (min): 50 min    Skilled Therapeutic Interventions/Progress Updates:    1:1 Pt received in the bed. Pt able to come to EOB with min A with extra time. Pt performed stand pivot transfer into w/c on her left with mod A with extra time. Self care retraining at sink level (pt's preference). Focus on functional use of left UE with min A against gravity, sit to stands, standing balance, hemi dressing and activity tolerance. Pt able to bathe 9/10 parts with min A with min to mod A for sit to stands. Pt performed donning her bra with trying to fasten it in the front with min A and then was able to turn it around and thread her arms. Pt was able to don shirt with setup. Pt required mod A to don pants with min A for dynamic sitting balance. Pt was able to assist with pulling up pants at the sink but required A to complete. Completed grooming at the sink with setup. Left sitting up with half lap tray.   Therapy Documentation Precautions:  Precautions Precautions: Fall Precaution Comments: Left side weakness. Inattention to L Restrictions Weight Bearing Restrictions: No Pain:  no c/o pain   Therapy/Group: Individual Therapy  R[facility] 08/12/2020, 3:06 PM

## 2020-08-12 NOTE — Progress Notes (Signed)
Physical Therapy Session Note  Patient Details  Name: Nicole [name] MRN: [id number] Date of Birth: [birth year]  Today's Date: 08/12/2020 PT Individual Time: 0156-1537 PT Individual Time Calculation (min): 40 min   Short Term Goals: Week 1:  PT Short Term Goal 1 (Week 1): Pt will maintain static sitting balance x 5 min with min A PT Short Term Goal 1 - Progress (Week 1): Met PT Short Term Goal 2 (Week 1): Pt will perform least restrictive transfer with assist x 1 PT Short Term Goal 2 - Progress (Week 1): Met PT Short Term Goal 3 (Week 1): Pt will initiate w/c mobility PT Short Term Goal 3 - Progress (Week 1): Met PT Short Term Goal 4 (Week 1): Pt will initiate gait training as safe and able PT Short Term Goal 4 - Progress (Week 1): Met Week 2:  PT Short Term Goal 1 (Week 2): pt to demonstrate gait training with LRAD 50' min A consistently PT Short Term Goal 2 (Week 2): pt to demo static sitting balance without trunk support at [place] for 10 mins PT Short Term Goal 3 (Week 2): pt to demo dynamic standing balance CGA PT Short Term Goal 4 (Week 2): pt to mobilize [facility] for 75' min A  Skilled Therapeutic Interventions/Progress Updates:    pt received in [facility] and agreeable to therapy. Pt taken to gym to energy conservation, pt directed in x5 STS from [facility] to [place] at mod A on initial stand and min A for rest with VC for setup and BUE placement. Pt directed in [facility] mobility for retrieve x10 objects at various heights and reaching distances to promote improved LLE use for WC propulsion and safety in reaching in WC, CGA with VC for tech. Pt directed in forward propulsion 40' CGA with extra time. Pt directed in STS from [facility] to attempt gait training with L knee cage in place however did not improve gait pattern and pt continued to demo L knee buckling. Pt returned to room and requested to lay down, directed in Squat pivot to bedside at min A, min A for sit>supine and supervision for bed positioning in supine. Pt left in  supine, alarm set, All needs in reach and in good condition. Call light in hand.    Therapy Documentation Precautions:  Precautions Precautions: Fall Precaution Comments: Left side weakness. Inattention to L Restrictions Weight Bearing Restrictions: No General:   Vital Signs: Therapy Vitals Temp: 98 F (36.7 C) Temp Source: Oral Pulse Rate: 86 Resp: 17 BP: 125/80 Patient Position (if appropriate): Sitting Oxygen Therapy SpO2: 94 % O2 Device: Room Air    Therapy/Group: Individual Therapy  [name] 08/12/2020, 3:46 PM

## 2020-08-12 NOTE — Progress Notes (Signed)
[facility] PHYSICAL MEDICINE & REHABILITATION PROGRESS NOTE   Subjective/Complaints:   Pt reports doing fine- no complaints.    ROS:   Pt denies SOB, abd pain, CP, N/V/C/D, and vision changes    Objective:   No results found. No results for input(s): WBC, HGB, HCT, PLT in the last 72 hours. Recent Labs    08/11/20 0549  NA 143  K 3.6  CL 109  CO2 23  GLUCOSE 96  BUN 13  CREATININE 0.98  CALCIUM 9.3    Intake/Output Summary (Last 24 hours) at 08/12/2020 1457 Last data filed at 08/12/2020 1316 Gross per 24 hour  Intake 537 ml  Output --  Net 537 ml        Physical Exam: Vital Signs Blood pressure 125/80, pulse 86, temperature 98 F (36.7 C), temperature source Oral, resp. rate 17, height 5\' 5"  (1.651 m), weight 66.6 kg, SpO2 94 %. General: sitting up in bed- twirling/doing hair on L with R hand, NAD HEENT: conjugate gaze Heart: RRR Chest: CTA B/L- no W/R/R- good air movement Abdomen: Soft, NT, ND, (+)BS  Musculoskeletal:     Cervical back: L knee not TTP anymore- no swelling,     Comments: RUE/RLE- 5-/5 in all muscles tested LUE biceps, WE, triceps, and grip 3/5 Skin:    General: Skin is warm and dry.     Comments: IV L antecubital fossa- looks good   Left heel not visualized today Neurological: Alert;  fair awareness of her deficits. Decreased sensation to light touch in LUE/LLE- facial sensation normal per pt Not using L side unless forced to- still- using R hand to do hair on L side of head.   Assessment/Plan: 1. 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  Physiatrist is providing close team supervision and 24 hour management of active medical problems listed below.  Physiatrist and rehab team continue to assess barriers to discharge/monitor patient progress toward functional and medical goals  Care  Tool:  Bathing    Body parts bathed by patient: Chest, Abdomen, Face, Right upper leg, Left upper leg, Left arm, Right lower leg, Buttocks, Front perineal area, Left lower leg, Right arm   Body parts bathed by helper: Right arm, Left lower leg     Bathing assist Assist Level: Minimal Assistance - Patient > 75%     Upper Body Dressing/Undressing Upper body dressing   What is the patient wearing?: Bra, Pull over shirt    Upper body assist Assist Level: Moderate Assistance - Patient 50 - 74%    Lower Body Dressing/Undressing Lower body dressing      What is the patient wearing?: Pants, Underwear/pull up     Lower body assist Assist for lower body dressing: Moderate Assistance - Patient 50 - 74%     Toileting Toileting Toileting Activity did not occur (Clothing management and hygiene only): N/A (no void or bm)  Toileting assist Assist for toileting: Moderate Assistance - Patient 50 - 74%     Transfers Chair/bed transfer  Transfers assist  Chair/bed transfer activity did not occur: Refused  Chair/bed transfer assist level: Moderate Assistance - Patient 50 - 74%     Locomotion Ambulation   Ambulation assist   Ambulation activity did not occur: Safety/medical concerns  Assist level: 2 helpers Assistive device: Walker-rolling Max distance: 15'   Walk 10 feet activity   Assist  Walk 10  feet activity did not occur: Safety/medical concerns  Assist level: 2 helpers Assistive device: Walker-rolling   Walk 50 feet activity   Assist Walk 50 feet with 2 turns activity did not occur: Safety/medical concerns         Walk 150 feet activity   Assist Walk 150 feet activity did not occur: Safety/medical concerns         Walk 10 feet on uneven surface  activity   Assist Walk 10 feet on uneven surfaces activity did not occur: Safety/medical concerns         Wheelchair     Assist Will patient use wheelchair at discharge?: Yes Type of  Wheelchair: Manual Wheelchair activity did not occur: Safety/medical concerns  Wheelchair assist level: Supervision/Verbal cueing Max wheelchair distance: 100'    Wheelchair 50 feet with 2 turns activity    Assist    Wheelchair 50 feet with 2 turns activity did not occur: Safety/medical concerns   Assist Level: Contact Guard/Touching assist   Wheelchair 150 feet activity     Assist  Wheelchair 150 feet activity did not occur: Safety/medical concerns       Blood pressure 125/80, pulse 86, temperature 98 F (36.7 C), temperature source Oral, resp. rate 17, height 5\' 5"  (1.651 m), weight 66.6 kg, SpO2 94 %.  Medical Problem List and Plan: 1.  Left-sided weakness/almost hemiplegia secondary to right basal ganglia and corona radiata infarct             -patient may  shower             -ELOS/Goals: 14-18 days- goals CGA- mod I   9/27- making strength exam gains every few days  10/7- making gains, HOWEVER not using due to neglect- will d/w OT possibly starting Amantadine.   -Continue CIR 2.  Antithrombotics: -DVT/anticoagulation: SCDs             -antiplatelet therapy: Aspirin 81 mg daily Plavix 85 mg daily and BMS investigational med 3. Pain Management: Tramadol as needed   10/8- L knee pain gone- con't prn tylenol 4. Mood: Provide emotional support             -antipsychotic agents: N/A 5. Neuropsych: This patient is capable of making decisions on her own behalf. 6. Skin/Wound Care: Routine skin checks 7. Fluids/Electrolytes/Nutrition: Routine in and outs with follow-up chemistries  9/22- labs look good on admission and were great on repeat on 9/27 8.  Hyperlipidemia.  Lipitor 80mg  daily 9.  Hypothyroidism.  Synthroid 10.  Asthma.  Continue nhaler as needed 11.  Overactive bladder.  Ditropan 12.  Constipation.  MiraLAX twice daily as well as Senokot 2 tablets twice daily.  9/26 had results with sorbitol, meds. Stools now loose   -hold miralax for now  10/1: moving  bowels regularly. Change senna-docusate to HS PRN.   10/7- Bowels - LBm this AM  10/8- BM regular/daily 13.Glaucoma.pt says she doesn't take drops and doesn't have this dx.   9/22- will check with family.  14. L foot drop- will order PRAFO.  9/22- has been ordered- not here yet.  9/23- didn't see in room- will check into    9/30: in room, but patient states she has not been wearing. Will place nursing order. 15. HTN-  10/2: Uncontrolled. Kidney function is normal Add Lisinopril 2.5mg .   10/3: Uncontrolled. Increase Lisinopril to 5mg .   10/4-BP better controlled- con't meds  10/5- BP 160s/70s this AM- had been doing better- will monitor before changing  meds  10/6- BP 151/71 this AM  10/7- BP 140s/70s- con't regimen  10/8- BP 125/70- doing better 16. Poor appetite  9/23- will check CMP in AM- to see total protein and albumin- and check a prealbumin- I'M concerned she's eating <25% of meals (so far only seen breakfast).   9/24- pt's prealbumin is 30 (18-38), Albumin is good at 3.9 and total protein good at 8.5- con't regimen 17. Hypokalemia  9/27- K+ 3.9  10/4- K+ 3.5- low normal-   10/6- K+ recheck Thursday  18. Impaired cognition: continue SLP 19. Left sided inattention: continue therapy, education.  10/7-= think about amantadine  10/8- will d/w OT/PT.    LOS: 17 days A FACE TO FACE EVALUATION WAS PERFORMED  Marcine Gadway 08/12/2020, 2:57 PM

## 2020-08-12 NOTE — Progress Notes (Signed)
Physical Therapy Session Note  Patient Details  Name: Nicole [name] MRN: [id number] Date of Birth: November 20, 1929  Today's Date: 08/12/2020 PT Individual Time: 1645-1730 PT Individual Time Calculation (min): 45 min   Short Term Goals: Week 2:  PT Short Term Goal 1 (Week 2): pt to demonstrate gait training with LRAD 50' min A consistently PT Short Term Goal 2 (Week 2): pt to demo static sitting balance without trunk support at [place] for 10 mins PT Short Term Goal 3 (Week 2): pt to demo dynamic standing balance CGA PT Short Term Goal 4 (Week 2): pt to mobilize [facility] for 75' min A  Skilled Therapeutic Interventions/Progress Updates:   Pt received supine in bed and agreeable to PT. Supine>sit transfer with heavy use of rail and supervision assist. Pt assisted pt to don shoes. Stand pivot transfer to [facility] with RW and mod assist due to R knee instability.   Pt applied Anterior support ridged AFO to trail fit for knee control in stance. Pt ambulated 30f with min assist x 2 with moderate cues for improved WB through the LLE to allow improved activation of quads and knee control provided by brace Throughout; inconsistent Wb through the LLE noted, but improved with increased distance.   [facility] returned to room and [facility] and all needs met.          Therapy Documentation Precautions:  Precautions Precautions: Fall Precaution Comments: Left side weakness. Inattention to L Restrictions Weight Bearing Restrictions: No    Pain: denies   Therapy/Group: Individual Therapy  ALorie Phenix10/06/2020, 5:53 PM

## 2020-08-12 NOTE — Progress Notes (Signed)
Occupational Therapy Session Note  Patient Details  Name: MACENZIE BURFORD MRN: [id number] Date of Birth: April 13, 1930  Today's Date: 08/12/2020 OT Individual Time: 1115-1200 OT Individual Time Calculation (min): 45 min    Short Term Goals: Week 2:  OT Short Term Goal 1 (Week 2): Pt will complete UB dressing with min A OT Short Term Goal 1 - Progress (Week 2): Progressing toward goal OT Short Term Goal 2 (Week 2): Pt will perform toilet transfers with mod A OT Short Term Goal 2 - Progress (Week 2): Met OT Short Term Goal 3 (Week 2): Pt will complete LB dressing tasks with max A with sit<>stand OT Short Term Goal 3 - Progress (Week 2): Met OT Short Term Goal 4 (Week 2): Pt will complete toileting tasks with max A OT Short Term Goal 4 - Progress (Week 2): Progressing toward goal Week 3:  OT Short Term Goal 1 (Week 3): STG=LTG secondary to ELOS  Skilled Therapeutic Interventions/Progress Updates:    Pt resting in w/c upon arrival.  OT intervention with focus on LUE functional movement and strengthening. Table tasks included removing pegs from rubber peg board, placing pegs in peg board, grasping cup and bringing to mouth, and horizontal adduction. Pt with improved functional strength but continues to require minimal assistance provided at elbow to reduce gravity when performing tasks.  Pt with functional grasp and improved finger extension with digits 4&5. Much improved elbow flexion and shoulder adduction. Pt returned to room and remained in w/c with belt alarm activated and all needs within reach.   Therapy Documentation Precautions:  Precautions Precautions: Fall Precaution Comments: Left side weakness. Inattention to L Restrictions Weight Bearing Restrictions: No General:   Vital Signs:   Pain:  Pt denies pain this morning   Therapy/Group: Individual Therapy  Leroy Libman 08/12/2020, 12:16 PM

## 2020-08-13 ENCOUNTER — Inpatient Hospital Stay (HOSPITAL_COMMUNITY): Payer: Medicare Other

## 2020-08-13 MED ORDER — LISINOPRIL 10 MG PO TABS
10.0000 mg | ORAL_TABLET | Freq: Every day | ORAL | Status: DC
  Administered 2020-08-14 – 2020-08-16 (×3): 10 mg via ORAL
  Filled 2020-08-13 (×3): qty 1

## 2020-08-13 NOTE — Progress Notes (Signed)
Occupational Therapy Session Note  Patient Details  Name: Nicole [name] MRN: [id number] Date of Birth: 08-01-1930  Today's Date: 08/13/2020 OT Individual Time: 6283-1517 OT Individual Time Calculation (min): 26 min    Short Term Goals: Week 1:  OT Short Term Goal 1 (Week 1): Pt will complete UB dressing with min A OT Short Term Goal 1 - Progress (Week 1): Progressing toward goal OT Short Term Goal 2 (Week 1): Pt will recall hemi dressing techniques with no more than min cueing OT Short Term Goal 2 - Progress (Week 1): Met OT Short Term Goal 3 (Week 1): Pt will increase functional activity tolerance by remaining OOB for 30 min OT Short Term Goal 3 - Progress (Week 1): Met OT Short Term Goal 4 (Week 1): Pt will maintain static sitting balance with min A during ADL task OT Short Term Goal 4 - Progress (Week 1): Met  Skilled Therapeutic Interventions/Progress Updates:    1;1. Pt received in bed agreeable to dressing and grooming at sink with no pain reported. Sup>sit with flat bed and bed rails with CGA. SPT EOB>w/c with MIN A for power up and MOD A for turning to L with L knee block. Pt completes dressing wht MIN A for UB (threading LUE fully into bra, pull over shirt and sweater, and clasping bra in back). Pt threads BLE with A to position LLE into fig 4 and A to pull pants past hips. Exited session after set up A for grooming at sink with pt in w/c, exit alarm on and call light tin reach  Therapy Documentation Precautions:  Precautions Precautions: Fall Precaution Comments: Left side weakness. Inattention to L Restrictions Weight Bearing Restrictions: No General:   Vital Signs: Therapy Vitals Temp: 98 F (36.7 C) Pulse Rate: 73 Resp: 17 BP: (!) 144/64 Patient Position (if appropriate): Lying Oxygen Therapy SpO2: 94 % O2 Device: Room Air Pain:   ADL: ADL Eating: Set up Where Assessed-Eating: Bed level Grooming: Minimal assistance Where Assessed-Grooming: Bed level Upper  Body Bathing: Moderate cueing, Moderate assistance Where Assessed-Upper Body Bathing: Edge of bed Lower Body Bathing: Moderate assistance, Moderate cueing Where Assessed-Lower Body Bathing: Bed level Upper Body Dressing: Moderate cueing, Moderate assistance Where Assessed-Upper Body Dressing: Edge of bed Lower Body Dressing: Dependent Where Assessed-Lower Body Dressing: Bed level Toileting: Unable to assess Toilet Transfer: Unable to assess Toilet Transfer Method: Unable to assess Vision   Perception    Praxis   Exercises:   Other Treatments:     Therapy/Group: Individual Therapy  Tonny Branch 08/13/2020, 6:49 AM

## 2020-08-13 NOTE — Progress Notes (Signed)
[place] PHYSICAL MEDICINE & REHABILITATION PROGRESS NOTE   Subjective/Complaints:   Pt reports her BP is "up and down".     ROS:    Pt denies SOB, abd pain, CP, N/V/C/D, and vision changes   Objective:   No results found. No results for input(s): WBC, HGB, HCT, PLT in the last 72 hours. Recent Labs    08/11/20 0549  NA 143  K 3.6  CL 109  CO2 23  GLUCOSE 96  BUN 13  CREATININE 0.98  CALCIUM 9.3    Intake/Output Summary (Last 24 hours) at 08/13/2020 1312 Last data filed at 08/13/2020 0700 Gross per 24 hour  Intake 592 ml  Output --  Net 592 ml        Physical Exam: Vital Signs Blood pressure (!) 144/64, pulse 73, temperature 98 F (36.7 C), resp. rate 17, height 5\' 5"  (1.651 m), weight 66.6 kg, SpO2 94 %. General: sitting up reading paper, NAD HEENT: conjugate gaze Heart: RRR Chest: CTA B/L- no W/R/R- good air movement Abdomen: Soft, NT, ND, (+)BS  Musculoskeletal:     Cervical back: L knee not TTP anymore- no swelling,     Comments: RUE/RLE- 5-/5 in all muscles tested LUE biceps, WE, triceps, and grip 3/5 Skin:    General: Skin is warm and dry.     Comments: IV L antecubital fossa- looks good   Left heel not visualized today Neurological: Alert;  fair awareness of her deficits. Decreased sensation to light touch in LUE/LLE- facial sensation normal per pt Not using L side  Assessment/Plan: 1. 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  Physiatrist is providing close team supervision and 24 hour management of active medical problems listed below.  Physiatrist and rehab team continue to assess barriers to discharge/monitor patient progress toward functional and medical goals  Care Tool:  Bathing    Body parts bathed by patient: Chest, Abdomen, Face, Right upper leg, Left upper leg, Left arm, Right lower leg,  Front perineal area, Left lower leg, Right arm   Body parts bathed by helper: Buttocks     Bathing assist Assist Level: Minimal Assistance - Patient > 75%     Upper Body Dressing/Undressing Upper body dressing   What is the patient wearing?: Bra, Pull over shirt    Upper body assist Assist Level: Minimal Assistance - Patient > 75%    Lower Body Dressing/Undressing Lower body dressing      What is the patient wearing?: Pants, Underwear/pull up     Lower body assist Assist for lower body dressing: Moderate Assistance - Patient 50 - 74%     Toileting Toileting Toileting Activity did not occur (Clothing management and hygiene only): N/A (no void or bm)  Toileting assist Assist for toileting: Moderate Assistance - Patient 50 - 74%     Transfers Chair/bed transfer  Transfers assist  Chair/bed transfer activity did not occur: Refused  Chair/bed transfer assist level: Moderate Assistance - Patient 50 - 74%     Locomotion Ambulation   Ambulation assist   Ambulation activity did not occur: Safety/medical concerns  Assist level: 2 helpers Assistive device: Walker-rolling Max distance: 15'   Walk 10 feet activity   Assist  Walk 10 feet activity did not occur: Safety/medical concerns  Assist level: 2 helpers Assistive device: Walker-rolling   Walk 50 feet activity   Assist Walk 50 feet with  2 turns activity did not occur: Safety/medical concerns         Walk 150 feet activity   Assist Walk 150 feet activity did not occur: Safety/medical concerns         Walk 10 feet on uneven surface  activity   Assist Walk 10 feet on uneven surfaces activity did not occur: Safety/medical concerns         Wheelchair     Assist Will patient use wheelchair at discharge?: Yes Type of Wheelchair: Manual Wheelchair activity did not occur: Safety/medical concerns  Wheelchair assist level: Supervision/Verbal cueing Max wheelchair distance: 100'     Wheelchair 50 feet with 2 turns activity    Assist    Wheelchair 50 feet with 2 turns activity did not occur: Safety/medical concerns   Assist Level: Contact Guard/Touching assist   Wheelchair 150 feet activity     Assist  Wheelchair 150 feet activity did not occur: Safety/medical concerns       Blood pressure (!) 144/64, pulse 73, temperature 98 F (36.7 C), resp. rate 17, height 5\' 5"  (1.651 m), weight 66.6 kg, SpO2 94 %.  Medical Problem List and Plan: 1.  Left-sided weakness/almost hemiplegia secondary to right basal ganglia and corona radiata infarct             -patient may  shower             -ELOS/Goals: 14-18 days- goals CGA- mod I   9/27- making strength exam gains every few days  10/7- making gains, HOWEVER not using due to neglect- will d/w OT possibly starting Amantadine.  10/9- reading paper   -Continue CIR 2.  Antithrombotics: -DVT/anticoagulation: SCDs             -antiplatelet therapy: Aspirin 81 mg daily Plavix 85 mg daily and BMS investigational med 3. Pain Management: Tramadol as needed   10/9- L knee pain gone- con't prn tylenol 4. Mood: Provide emotional support             -antipsychotic agents: N/A 5. Neuropsych: This patient is capable of making decisions on her own behalf. 6. Skin/Wound Care: Routine skin checks 7. Fluids/Electrolytes/Nutrition: Routine in and outs with follow-up chemistries  9/22- labs look good on admission and were great on repeat on 9/27 8.  Hyperlipidemia.  Lipitor 80mg  daily 9.  Hypothyroidism.  Synthroid 10.  Asthma.  Continue nhaler as needed 11.  Overactive bladder.  Ditropan 12.  Constipation.  MiraLAX twice daily as well as Senokot 2 tablets twice daily.  9/26 had results with sorbitol, meds. Stools now loose   -hold miralax for now  10/9- con't BM daily 13.Glaucoma.pt says she doesn't take drops and doesn't have this dx.   9/22- will check with family.  14. L foot drop- will order PRAFO.  9/22- has  been ordered- not here yet.  9/23- didn't see in room- will check into    9/30: in room, but patient states she has not been wearing. Will place nursing order. 15. HTN-  10/2: Uncontrolled. Kidney function is normal Add Lisinopril 2.5mg .   10/3: Uncontrolled. Increase Lisinopril to 5mg .   10/4-BP better controlled- con't meds  10/5- BP 160s/70s this AM- had been doing better- will monitor before changing meds  10/6- BP 151/71 this AM  10/7- BP 140s/70s- con't regimen  10/8- BP 125/70- doing better  10/9- BP 144/64- increase lisinopril 10 mg daily.  16. Poor appetite  9/23- will check CMP in AM- to see total protein  and albumin- and check a prealbumin- I'M concerned she's eating <25% of meals (so far only seen breakfast).   9/24- pt's prealbumin is 30 (18-38), Albumin is good at 3.9 and total protein good at 8.5- con't regimen 17. Hypokalemia  9/27- K+ 3.9  10/4- K+ 3.5- low normal-   10/6- K+ recheck Thursday  10/9- K+ 3.6  18. Impaired cognition: continue SLP 19. Left sided inattention: continue therapy, education.  10/7-= think about amantadine  10/8- will d/w OT/PT.    LOS: 18 days A FACE TO FACE EVALUATION WAS PERFORMED  Tamber Burtch 08/13/2020, 1:12 PM

## 2020-08-14 NOTE — Progress Notes (Signed)
[place] PHYSICAL MEDICINE & REHABILITATION PROGRESS NOTE   Subjective/Complaints:   Pt reports doing "great"- LBM yesterday- ate a "little breakfast"- is not a breakfast eater.       ROS:   Pt denies SOB, abd pain, CP, N/V/C/D, and vision changes   Objective:   No results found. No results for input(s): WBC, HGB, HCT, PLT in the last 72 hours. No results for input(s): NA, K, CL, CO2, GLUCOSE, BUN, CREATININE, CALCIUM in the last 72 hours.  Intake/Output Summary (Last 24 hours) at 08/14/2020 1240 Last data filed at 08/13/2020 1300 Gross per 24 hour  Intake 118 ml  Output --  Net 118 ml        Physical Exam: Vital Signs Blood pressure (!) 149/69, pulse 75, temperature 98.1 F (36.7 C), resp. rate 18, height 5\' 5"  (1.651 m), weight 66.6 kg, SpO2 96 %. General: sitting up in bed- appropriate, RN in room, NAD HEENT: conjugate gaze Heart: RRR Chest: CTA B/L- no W/R/R- good air movement Abdomen: Soft, NT, ND, (+)BS  Musculoskeletal:     Cervical back: L knee not TTP anymore- no swelling,     Comments: RUE/RLE- 5-/5 in all muscles tested LUE biceps, WE, triceps, and grip 3/5 Skin:    General: Skin is warm and dry.     Comments: IV L antecubital fossa- out   Left heel not visualized today Neurological: Alert;  fair awareness of her deficits. Decreased sensation to light touch in LUE/LLE- facial sensation normal per pt Not using L side still unless forced  Assessment/Plan: 1. 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  Physiatrist is providing close team supervision and 24 hour management of active medical problems listed below.  Physiatrist and rehab team continue to assess barriers to discharge/monitor patient progress toward functional and medical goals  Care Tool:  Bathing    Body parts bathed by patient: Chest, Abdomen,  Face, Right upper leg, Left upper leg, Left arm, Right lower leg, Front perineal area, Left lower leg, Right arm   Body parts bathed by helper: Buttocks     Bathing assist Assist Level: Minimal Assistance - Patient > 75%     Upper Body Dressing/Undressing Upper body dressing   What is the patient wearing?: Bra, Pull over shirt    Upper body assist Assist Level: Minimal Assistance - Patient > 75%    Lower Body Dressing/Undressing Lower body dressing      What is the patient wearing?: Pants, Underwear/pull up     Lower body assist Assist for lower body dressing: Moderate Assistance - Patient 50 - 74%     Toileting Toileting Toileting Activity did not occur (Clothing management and hygiene only): N/A (no void or bm)  Toileting assist Assist for toileting: Moderate Assistance - Patient 50 - 74%     Transfers Chair/bed transfer  Transfers assist  Chair/bed transfer activity did not occur: Refused  Chair/bed transfer assist level: Moderate Assistance - Patient 50 - 74%     Locomotion Ambulation   Ambulation assist   Ambulation activity did not occur: Safety/medical concerns  Assist level: 2 helpers Assistive device: Walker-rolling Max distance: 15'   Walk 10 feet activity   Assist  Walk 10 feet activity did not occur: Safety/medical concerns  Assist level: 2 helpers Assistive device: Walker-rolling   Walk 50 feet activity   Assist Walk 50 feet with 2 turns  activity did not occur: Safety/medical concerns         Walk 150 feet activity   Assist Walk 150 feet activity did not occur: Safety/medical concerns         Walk 10 feet on uneven surface  activity   Assist Walk 10 feet on uneven surfaces activity did not occur: Safety/medical concerns         Wheelchair     Assist Will patient use wheelchair at discharge?: Yes Type of Wheelchair: Manual Wheelchair activity did not occur: Safety/medical concerns  Wheelchair assist level:  Supervision/Verbal cueing Max wheelchair distance: 100'    Wheelchair 50 feet with 2 turns activity    Assist    Wheelchair 50 feet with 2 turns activity did not occur: Safety/medical concerns   Assist Level: Contact Guard/Touching assist   Wheelchair 150 feet activity     Assist  Wheelchair 150 feet activity did not occur: Safety/medical concerns       Blood pressure (!) 149/69, pulse 75, temperature 98.1 F (36.7 C), resp. rate 18, height 5\' 5"  (1.651 m), weight 66.6 kg, SpO2 96 %.  Medical Problem List and Plan: 1.  Left-sided weakness/almost hemiplegia secondary to right basal ganglia and corona radiata infarct             -patient may  shower             -ELOS/Goals: 14-18 days- goals CGA- mod I   9/27- making strength exam gains every few days  10/7- making gains, HOWEVER not using due to neglect- will d/w OT possibly starting Amantadine.  10/9- reading paper   -Continue CIR 2.  Antithrombotics: -DVT/anticoagulation: SCDs             -antiplatelet therapy: Aspirin 81 mg daily Plavix 85 mg daily and BMS investigational med 3. Pain Management: Tramadol as needed   110/10- L knee pain gone- con't tylenol prn 4. Mood: Provide emotional support             -antipsychotic agents: N/A 5. Neuropsych: This patient is capable of making decisions on her own behalf. 6. Skin/Wound Care: Routine skin checks 7. Fluids/Electrolytes/Nutrition: Routine in and outs with follow-up chemistries  9/22- labs look good on admission and were great on repeat on 9/27 8.  Hyperlipidemia.  Lipitor 80mg  daily 9.  Hypothyroidism.  Synthroid 10.  Asthma.  Continue nhaler as needed 11.  Overactive bladder.  Ditropan 12.  Constipation.  MiraLAX twice daily as well as Senokot 2 tablets twice daily.  9/26 had results with sorbitol, meds. Stools now loose   -hold miralax for now  10/10- LBM yesterday -con't regimen 13.Glaucoma.pt says she doesn't take drops and doesn't have this dx.   9/22-  will check with family.  14. L foot drop- will order PRAFO.  9/22- has been ordered- not here yet.  9/23- didn't see in room- will check into    9/30: in room, but patient states she has not been wearing. Will place nursing order. 15. HTN-  10/2: Uncontrolled. Kidney function is normal Add Lisinopril 2.5mg .   10/3: Uncontrolled. Increase Lisinopril to 5mg .   10/4-BP better controlled- con't meds  10/5- BP 160s/70s this AM- had been doing better- will monitor before changing meds  10/6- BP 151/71 this AM  10/7- BP 140s/70s- con't regimen  10/8- BP 125/70- doing better  10/9- BP 144/64- increase lisinopril 10 mg daily.  10/10- BP 149/64- but last 2 BP's 120s/130s systolic- won't make changes as of yet  16. Poor appetite  9/23- will check CMP in AM- to see total protein and albumin- and check a prealbumin- I'M concerned she's eating <25% of meals (so far only seen breakfast).   9/24- pt's prealbumin is 30 (18-38), Albumin is good at 3.9 and total protein good at 8.5- con't regimen 17. Hypokalemia  9/27- K+ 3.9  10/4- K+ 3.5- low normal-   10/6- K+ recheck Thursday  10/9- K+ 3.6  18. Impaired cognition: continue SLP 19. Left sided inattention: continue therapy, education.  10/7-= think about amantadine  10/8- will d/w OT/PT.    LOS: 19 days A FACE TO FACE EVALUATION WAS PERFORMED  Adonis Ryther 08/14/2020, 12:40 PM

## 2020-08-15 ENCOUNTER — Inpatient Hospital Stay (HOSPITAL_COMMUNITY): Payer: Medicare Other

## 2020-08-15 ENCOUNTER — Inpatient Hospital Stay (HOSPITAL_COMMUNITY): Payer: Medicare Other | Admitting: Physical Therapy

## 2020-08-15 LAB — BASIC METABOLIC PANEL WITH GFR
Anion gap: 9 (ref 5–15)
BUN: 16 mg/dL (ref 8–23)
CO2: 25 mmol/L (ref 22–32)
Calcium: 9.3 mg/dL (ref 8.9–10.3)
Chloride: 106 mmol/L (ref 98–111)
Creatinine, Ser: 0.9 mg/dL (ref 0.44–1.00)
GFR, Estimated: 57 mL/min — ABNORMAL LOW (ref >60)
Glucose, Bld: 88 mg/dL (ref 70–99)
Potassium: 3.7 mmol/L (ref 3.5–5.1)
Sodium: 140 mmol/L (ref 135–145)

## 2020-08-15 LAB — CBC WITH DIFFERENTIAL/PLATELET
Abs Immature Granulocytes: 0.01 10*3/uL (ref 0.00–0.07)
Basophils Absolute: 0.1 10*3/uL (ref 0.0–0.1)
Basophils Relative: 1 %
Eosinophils Absolute: 0.1 10*3/uL (ref 0.0–0.5)
Eosinophils Relative: 2 %
HCT: 38.5 % (ref 36.0–46.0)
Hemoglobin: 12 g/dL (ref 12.0–15.0)
Immature Granulocytes: 0 %
Lymphocytes Relative: 31 %
Lymphs Abs: 2.2 10*3/uL (ref 0.7–4.0)
MCH: 30.3 pg (ref 26.0–34.0)
MCHC: 31.2 g/dL (ref 30.0–36.0)
MCV: 97.2 fL (ref 80.0–100.0)
Monocytes Absolute: 0.4 10*3/uL (ref 0.1–1.0)
Monocytes Relative: 6 %
Neutro Abs: 4.2 10*3/uL (ref 1.7–7.7)
Neutrophils Relative %: 60 %
Platelets: 239 10*3/uL (ref 150–400)
RBC: 3.96 MIL/uL (ref 3.87–5.11)
RDW: 12.8 % (ref 11.5–15.5)
WBC: 7 10*3/uL (ref 4.0–10.5)
nRBC: 0 % (ref 0.0–0.2)

## 2020-08-15 NOTE — Progress Notes (Signed)
[place] PHYSICAL MEDICINE & REHABILITATION PROGRESS NOTE   Subjective/Complaints: No complaints this morning Daughter at bedside has questions regarding BP, diet, how to prevent future stroke Knee pain is well controlled  ROS:   Pt denies SOB, abd pain, CP, N/V/C/D, and vision changes   Objective:   No results found. Recent Labs    08/15/20 0530  WBC 7.0  HGB 12.0  HCT 38.5  PLT 239   Recent Labs    08/15/20 0530  NA 140  K 3.7  CL 106  CO2 25  GLUCOSE 88  BUN 16  CREATININE 0.90  CALCIUM 9.3    Intake/Output Summary (Last 24 hours) at 08/15/2020 1159 Last data filed at 08/15/2020 0745 Gross per 24 hour  Intake 535 ml  Output --  Net 535 ml        Physical Exam: Vital Signs Blood pressure (!) 156/80, pulse 79, temperature 98 F (36.7 C), temperature source Oral, resp. rate 16, height 5\' 5"  (1.651 m), weight 66.6 kg, SpO2 96 %. General: Alert and oriented x 3, No apparent distress HEENT: Head is normocephalic, atraumatic, PERRLA, EOMI, sclera anicteric, oral mucosa pink and moist, dentition intact, ext ear canals clear,  Neck: Supple without JVD or lymphadenopathy Heart: Reg rate and rhythm. No murmurs rubs or gallops Chest: CTA bilaterally without wheezes, rales, or rhonchi; no distress Abdomen: Soft, non-tender, non-distended, bowel sounds positive. Musculoskeletal:     Cervical back: L knee not TTP anymore- no swelling,     Comments: RUE/RLE- 5-/5 in all muscles tested LUE biceps, WE, triceps, and grip 3/5 Skin:    General: Skin is warm and dry.     Comments: IV L antecubital fossa- out   Left heel not visualized today Neurological: Alert;  fair awareness of her deficits. Decreased sensation to light touch in LUE/LLE- facial sensation normal per pt Not using L side still unless forced   Assessment/Plan: 1. 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  Physiatrist is providing close team supervision and 24 hour management of active medical problems listed below.  Physiatrist and rehab team continue to assess barriers to discharge/monitor patient progress toward functional and medical goals  Care Tool:  Bathing    Body parts bathed by patient: Chest, Abdomen, Face, Right upper leg, Left upper leg, Left arm, Right lower leg, Front perineal area, Left lower leg, Right arm   Body parts bathed by helper: Buttocks     Bathing assist Assist Level: Minimal Assistance - Patient > 75%     Upper Body Dressing/Undressing Upper body dressing   What is the patient wearing?: Bra, Pull over shirt    Upper body assist Assist Level: Minimal Assistance - Patient > 75%    Lower Body Dressing/Undressing Lower body dressing      What is the patient wearing?: Pants, Underwear/pull up     Lower body assist Assist for lower body dressing: Moderate Assistance - Patient 50 - 74%     Toileting Toileting Toileting Activity did not occur (Clothing management and hygiene only): N/A (no void or bm)  Toileting assist Assist for toileting: Moderate Assistance - Patient 50 - 74%     Transfers Chair/bed transfer  Transfers assist  Chair/bed transfer activity did not occur: Refused  Chair/bed transfer assist level: Moderate Assistance - Patient 50 - 74%     Locomotion Ambulation   Ambulation assist  Ambulation activity did not occur: Safety/medical concerns  Assist level: 2 helpers Assistive device: Walker-rolling Max distance: 15'   Walk 10 feet activity   Assist  Walk 10 feet activity did not occur: Safety/medical concerns  Assist level: 2 helpers Assistive device: Walker-rolling   Walk 50 feet activity   Assist Walk 50 feet with 2 turns activity did not occur: Safety/medical concerns         Walk 150 feet activity   Assist Walk 150 feet activity did not occur: Safety/medical  concerns         Walk 10 feet on uneven surface  activity   Assist Walk 10 feet on uneven surfaces activity did not occur: Safety/medical concerns         Wheelchair     Assist Will patient use wheelchair at discharge?: Yes Type of Wheelchair: Manual Wheelchair activity did not occur: Safety/medical concerns  Wheelchair assist level: Supervision/Verbal cueing Max wheelchair distance: 100'    Wheelchair 50 feet with 2 turns activity    Assist    Wheelchair 50 feet with 2 turns activity did not occur: Safety/medical concerns   Assist Level: Contact Guard/Touching assist   Wheelchair 150 feet activity     Assist  Wheelchair 150 feet activity did not occur: Safety/medical concerns       Blood pressure (!) 156/80, pulse 79, temperature 98 F (36.7 C), temperature source Oral, resp. rate 16, height 5\' 5"  (1.651 m), weight 66.6 kg, SpO2 96 %.  Medical Problem List and Plan: 1.  Left-sided weakness/almost hemiplegia secondary to right basal ganglia and corona radiata infarct             -patient may  shower             -ELOS/Goals: 14-18 days- goals CGA- mod I   9/27- making strength exam gains every few days  10/7- making gains, HOWEVER not using due to neglect- will d/w OT possibly starting Amantadine.  10/9- reading paper   -Continue CIR 2.  Antithrombotics: -DVT/anticoagulation: SCDs             -antiplatelet therapy: Aspirin 81 mg daily Plavix 85 mg daily and BMS investigational med 3. Pain Management:   10/11: knee pain remains well controlled 4. Mood: Provide emotional support             -antipsychotic agents: N/A 5. Neuropsych: This patient is capable of making decisions on her own behalf. 6. Skin/Wound Care: Routine skin checks 7. Fluids/Electrolytes/Nutrition: Routine in and outs with follow-up chemistries  9/22- labs look good on admission and were great on repeat on 9/27 8.  Hyperlipidemia.  Lipitor 80mg  daily 9.  Hypothyroidism.   Synthroid 10.  Asthma.  Continue nhaler as needed 11.  Overactive bladder.  Ditropan 12.  Constipation.  MiraLAX twice daily as well as Senokot 2 tablets twice daily.  9/26 had results with sorbitol, meds. Stools now loose   -hold miralax for now  10/10- LBM yesterday -con't regimen 13.Glaucoma.pt says she doesn't take drops and doesn't have this dx.   9/22- will check with family.  14. L foot drop- will order PRAFO.  9/22- has been ordered- not here yet.  9/23- didn't see in room- will check into    9/30: in room, but patient states she has not been wearing. Will place nursing order. 15. HTN-  10/2: Uncontrolled. Kidney function is normal Add Lisinopril 2.5mg .   10/3: Uncontrolled. Increase Lisinopril to 5mg .   10/4-BP better controlled- con't meds  10/5- BP 160s/70s this AM- had been doing better- will monitor before changing meds  10/6- BP 151/71 this AM  10/7- BP 140s/70s- con't regimen  10/8- BP 125/70- doing better  10/9- BP 144/64- increase lisinopril 10 mg daily.  10/10- BP 149/64- but last 2 BP's 120s/130s systolic- won't make changes as of yet   10/11: reviewed BP with daughter and recommended high potassium whole food diet at home, checking BP daily, and bringing log to f/u appointment 16. Poor appetite  9/23- will check CMP in AM- to see total protein and albumin- and check a prealbumin- I'M concerned she's eating <25% of meals (so far only seen breakfast).   9/24- pt's prealbumin is 30 (18-38), Albumin is good at 3.9 and total protein good at 8.5- con't regimen 17. Hypokalemia  9/27- K+ 3.9  10/4- K+ 3.5- low normal-   10/6- K+ recheck Thursday  10/9- K+ 3.6  10/11: discussed trends with patient and daughter  50. Impaired cognition: continue SLP 19. Left sided inattention: continue therapy, education.  10/7- think about amantadine  10/8- will d/w OT/PT.    LOS: 20 days A FACE TO FACE EVALUATION WAS PERFORMED  Yoandri Congrove P Sindee Stucker 08/15/2020, 11:59 AM

## 2020-08-15 NOTE — Discharge Instructions (Signed)
Inpatient Rehab Discharge Instructions  Nicole [name] Discharge date and time: No discharge date for patient encounter.   Activities/Precautions/ Functional Status: Activity: activity as tolerated Diet: regular diet Wound Care: Routine skin checks Functional status:  ___ No restrictions     ___ Walk up steps independently ___ 24/7 supervision/assistance   ___ Walk up steps with assistance ___ Intermittent supervision/assistance  ___ Bathe/dress independently ___ Walk with walker     _x__ Bathe/dress with assistance ___ Walk Independently    ___ Shower independently ___ Walk with assistance    ___ Shower with assistance ___ No alcohol     ___ Return to work/school ________  Special Instructions: No driving smoking or alcohol  COMMUNITY REFERRALS UPON DISCHARGE:    Home Health:   PT, OT, SP AND AIDE                  Agency:[facility] [phone]  Medical Equipment/Items Ordered: wheelchair, 3 in 1 and rolling walker                                                 Agency/Supplier:[facility] [phone]   STROKE/TIA DISCHARGE INSTRUCTIONS SMOKING Cigarette smoking nearly doubles your risk of having a stroke & is the single most alterable risk factor  If you smoke or have smoked in the last 12 months, you are advised to quit smoking for your health.  Most of the excess cardiovascular risk related to smoking disappears within a year of stopping.  Ask you doctor about anti-smoking medications  [place] Quit Line: 1-800-QUIT NOW  Free Smoking Cessation Classes [phone]  CHOLESTEROL Know your levels; limit fat & cholesterol in your diet  Lipid Panel     Component Value Date/Time   CHOL 195 07/21/2020 1412   TRIG 80 07/21/2020 1412   HDL 51 07/21/2020 1412   CHOLHDL 3.8 07/21/2020 1412   VLDL 16 07/21/2020 1412   LDLCALC 128 (H) 07/21/2020 1412      Many patients benefit from treatment even if their cholesterol is at goal.  Goal: Total Cholesterol  (CHOL) less than 160  Goal:  Triglycerides (TRIG) less than 150  Goal:  HDL greater than 40  Goal:  LDL (LDLCALC) less than 100   BLOOD PRESSURE American Stroke Association blood pressure target is less that 120/80 mm/Hg  Your discharge blood pressure is:  BP: 131/70  Monitor your blood pressure  Limit your salt and alcohol intake  Many individuals will require more than one medication for high blood pressure  DIABETES (A1c is a blood sugar average for last 3 months) Goal HGBA1c is under 7% (HBGA1c is blood sugar average for last 3 months)  Diabetes: No known diagnosis of diabetes    Lab Results  Component Value Date   HGBA1C 5.4 07/21/2020     Your HGBA1c can be lowered with medications, healthy diet, and exercise.  Check your blood sugar as directed by your physician  Call your physician if you experience unexplained or low blood sugars.  PHYSICAL ACTIVITY/REHABILITATION Goal is 30 minutes at least 4 days per week  Activity: Increase activity slowly, Therapies: Physical Therapy: Home Health Return to work:   Sensory Loss After a Stroke A stroke can damage parts of your brain that control your body's normal functions, including your senses. As a result, you may have sensory  loss, such as trouble seeing, tasting, swallowing, or feeling touch or pressure sensations. You may have problems feeling temperature changes or moving your body in a coordinated way. You may also perceive smell differently. You may have problems with all of your senses or only some of them. By following a treatment plan, you may recover lost senses and manage the changes to your lifestyle. What are treatment therapies for sensory loss? You may have a combination of therapies for sensory loss.  Physical therapy. This may include: ? Exercises to improve your coordination and balance. ? Exercises that combine touch, balance, and movement (sensorimotor training). ? Movements to relieve pressure while you  are sitting or lying (mobility training). ? Splints or braces to protect parts of your body that you cannot feel.  Devices to help with blood flow (circulation) and to help stimulate nerves in affected parts of your body.  Speech therapy to help you swallow safely.  Occupational therapy to help you with everyday tasks. This may include: ? Exercises or devices to improve your vision. ? Exercises to help you increase your touch perception. These may include feeling objects of different sizes and textures while your eyes are closed. ? Techniques for moving safely in your environment.  Prescription eye glasses for vision loss.  Hearing aids for hearing loss. Follow these instructions at home: Safety   Your risk of falling is higher after a stroke. You may have difficulty feeling your legs and feet or coordinating your movements. To lower your risk of falling: ? Use devices to help you move around (assistive devices), such as a wheelchair or walker, as directed by your health care team. ? Wear prescription eye glasses at all times when moving around. ? Use lights to help you see in the dark. ? Use grab bars in bathrooms and handrails in stairways. ? Keep walkways clear in your home by removing rugs, cords, and clutter from the floor.  Your risk of getting burned is higher after a stroke. To lower your risk of burns: ? Test the water temperature before taking a bath or washing your hands. ? Allow hot foods to cool slightly before eating. ? Use potholders when handling hot pans.  When using sharp objects, such as scissors or knives, use your healthy hand. Do not handle sharp objects with your hand that was affected by your stroke, if this applies. Activity  Return to your normal activities as told by your health care provider. Ask your health care provider what activities are safe for you.  Avoid spending too much time sitting or lying down. If you must be in a chair or bed, change  positions regularly.  You may have problems doing your normal activities. Ask your health care provider about getting extra help at home. Eating and drinking   You may have problems swallowing food and fluids after a stroke. The problems can be due to: ? Changes in your muscles. ? Sensory changes, such as:  Difficulty feeling the consistency or size of a piece of food in your mouth.  Inability to feel the need to clear your throat.  You may need to: ? Take smaller bites and chew thoroughly. Make sure you have swallowed all the food in your mouth before you take another bite. ? Sit in an upright position when eating or drinking. ? Avoid distractions while eating or drinking. ? Stay upright for 30-45 minutes after eating. ? Change the texture of some things that you eat and drink. This  may include:  Changing foods to a smooth, mashed consistency (puree).  Thickening liquids.  Follow instructions from your health care provider about eating and drinking restrictions. General instructions  Wear arm or leg braces as told by your health care team.  Get help at home as needed. You may need help getting dressed, bathing, using the bathroom, eating, or doing other activities.  Keep all follow-up visits as told by your health care providers. This is important. Summary  It is common to have sensory loss after a stroke. Sensory loss means that you have problems with some or all of your senses, such as vision, taste, hearing, smell, and touch.  Sensory loss happens because of damage to your brain and nervous system after a stroke.  Treatment for sensory loss may include physical, occupational, or speech therapy, and the use of assistive devices.  You may need to make changes to your home and lifestyle after a stroke to help you live safely and independently. This information is not intended to replace advice given to you by your health care provider. Make sure you discuss any questions  you have with your health care provider. Document Revised: 02/13/2019 Document Reviewed: 02/08/2017 Elsevier Patient Education  2020 [employer].   Activity decreases your risk of heart attack and stroke and makes your heart stronger.  It helps control your weight and blood pressure; helps you relax and can improve your mood.  Participate in a regular exercise program.  Talk with your doctor about the best form of exercise for you (dancing, walking, swimming, cycling).  DIET/WEIGHT Goal is to maintain a healthy weight  Your discharge diet is:  Diet Order            Diet Heart Room service appropriate? Yes; Fluid consistency: Thin  Diet effective now                 liquids Your height is:  Height: 5\' 5"  (165.1 cm) Your current weight is: Weight: 66.6 kg Your Body Mass Index (BMI) is:  BMI (Calculated): 24.43  Following the type of diet specifically designed for you will help prevent another stroke.  Your goal weight range is:    Your goal Body Mass Index (BMI) is 19-24.  Healthy food habits can help reduce 3 risk factors for stroke:  High cholesterol, hypertension, and excess weight.  RESOURCES Stroke/Support Group:  Call [phone]   STROKE EDUCATION PROVIDED/REVIEWED AND GIVEN TO PATIENT Stroke warning signs and symptoms How to activate emergency medical system (call 911). Medications prescribed at discharge. Need for follow-up after discharge. Personal risk factors for stroke. Pneumonia vaccine given:  Flu vaccine given:  My questions have been answered, the writing is legible, and I understand these instructions.  I will adhere to these goals & educational materials that have been provided to me after my discharge from the hospital.      My questions have been answered and I understand these instructions. I will adhere to these goals and the provided educational materials after my discharge from the hospital.  Patient/Caregiver Signature  _______________________________ Date __________  Clinician Signature _______________________________________ Date __________  Please bring this form and your medication list with you to all your follow-up doctor's appointments.

## 2020-08-15 NOTE — Progress Notes (Signed)
Occupational Therapy Session Note  Patient Details  Name: EBUNOLUWA GERNERT MRN: [id number] Date of Birth: [birth year]  Today's Date: 08/15/2020 OT Individual Time: 0459-9774 OT Individual Time Calculation (min): 45 min    Short Term Goals: Week 3:  OT Short Term Goal 1 (Week 3): STG=LTG secondary to ELOS  Skilled Therapeutic Interventions/Progress Updates:    Pt received supine with her daughter present. Pt with no c/o pain and agreeable to take shower. Pt completed bed mobility with min A to EOB. Good carryover of technique and attention to L side during transfer. Pt completed stand pivot transfer to the w/c with min A, requiring mod A to scoot back in the chair. Pt completed stand pivot transfer to shower seat in the shower with min cueing for technique and min A. Min cueing for washing RUE with the LUE with min A provided. Pt was able to complete lateral leans to wash her bottom with CGA for balance. Pt transferred back to w/c with min A. She completed UB dressing with min A to pull down posteriorly but self managing threading B sleeves on her own. Pt donned pants with min A. Edu/demonstration provided to pt's daughter throughout session.  Pt was left sitting up with her daughter present, all needs met.   Therapy Documentation Precautions:  Precautions Precautions: Fall Precaution Comments: Left side weakness. Inattention to L Restrictions Weight Bearing Restrictions: No   Therapy/Group: Individual Therapy  Curtis Sites 08/15/2020, 6:52 AM

## 2020-08-15 NOTE — Progress Notes (Signed)
Occupational Therapy Session Note  Patient Details  Name: Nicole [name] MRN: [id number] Date of Birth: 11-16-1929  Today's Date: 08/15/2020 OT Individual Time: 1100-1200 OT Individual Time Calculation (min): 60 min    Short Term Goals: Week 2:  OT Short Term Goal 1 (Week 2): Pt will complete UB dressing with min A OT Short Term Goal 1 - Progress (Week 2): Progressing toward goal OT Short Term Goal 2 (Week 2): Pt will perform toilet transfers with mod A OT Short Term Goal 2 - Progress (Week 2): Met OT Short Term Goal 3 (Week 2): Pt will complete LB dressing tasks with max A with sit<>stand OT Short Term Goal 3 - Progress (Week 2): Met OT Short Term Goal 4 (Week 2): Pt will complete toileting tasks with max A OT Short Term Goal 4 - Progress (Week 2): Progressing toward goal Week 3:  OT Short Term Goal 1 (Week 3): STG=LTG secondary to ELOS  Skilled Therapeutic Interventions/Progress Updates:    Pt resting in w/c upon arrival with daughter present. OT intervention with focus on [facility] transfers and toileting tasks. L AFO donned prior to transfers. Pt practiced SPT with therapist X 3 to demonstrate to daughter. Pt practiced SPT transfer to [facility] X 2 and simulated clothing management X 1. Daughter required min verbal cues for body mechanics. Recommended to hold off on shower until home health therapy initiates treatment. Demonstrated LUE exercises for daughter to initiate at discharge. Pt remained in w/c with belt alarm activated and all needs within reach. Daughter present.   Therapy Documentation Precautions:  Precautions Precautions: Fall Precaution Comments: Left side weakness. Inattention to L Restrictions Weight Bearing Restrictions: No  Pain:     Therapy/Group: Individual Therapy  Leroy Libman 08/15/2020, 12:15 PM

## 2020-08-15 NOTE — Progress Notes (Signed)
Physical Therapy Weekly Progress Note  Patient Details  Name: Nicole [name] MRN: [id number] Date of Birth: June 18, 1930  Beginning of progress report period: August 05, 2020 End of progress report period: August 15, 2020  Today's Date: 08/15/2020     Patient has met 3 of 4 short term goals.  Pt is making slow progress towards therapy goals. Pt continues to require up to mod A for bed mobility and transfers at this time. Pt is able to perform gait up to 20 ft with RW and mod A with close w/c follow for safety. Pt continues to exhibit decreased L UE/LE strength and decreased weight bearing on LLE in standing. Pt is at Supervision level for w/c mobility with use of R UE/LE hemi technique. Weekly focus on d/c planning and family education with daughter.   Patient continues to demonstrate the following deficits muscle weakness, decreased cardiorespiratoy endurance, abnormal tone, unbalanced muscle activation, decreased coordination and decreased motor planning and decreased sitting balance, decreased standing balance, decreased postural control, hemiplegia and decreased balance strategies and therefore will continue to benefit from skilled PT intervention to increase functional independence with mobility.  Patient progressing toward long term goals..  Continue plan of care.  PT Short Term Goals Week 2:  PT Short Term Goal 1 (Week 2): pt to demonstrate gait training with LRAD 50' min A consistently PT Short Term Goal 1 - Progress (Week 2): Progressing toward goal PT Short Term Goal 2 (Week 2): pt to demo static sitting balance without trunk support at CGA for 10 mins PT Short Term Goal 2 - Progress (Week 2): Met PT Short Term Goal 3 (Week 2): pt to demo dynamic standing balance CGA PT Short Term Goal 3 - Progress (Week 2): Met PT Short Term Goal 4 (Week 2): pt to mobilize [facility] for 75' min A PT Short Term Goal 4 - Progress (Week 2): Met Week 3:  PT Short Term Goal 1 (Week 3): =LTG due to  ELOS    Therapy Documentation Precautions:  Precautions Precautions: Fall Precaution Comments: Left side weakness. Inattention to L Restrictions Weight Bearing Restrictions: No   Therapy/Group: Individual Therapy   Excell Seltzer, PT, DPT  08/15/2020, 7:40 AM

## 2020-08-15 NOTE — Discharge Summary (Signed)
Physician Discharge Summary  Patient ID: Nicole [name] MRN: [id number] DOB/AGE: 1930-10-28 86 y.o.  Admit date: 07/26/2020 Discharge date: 08/17/2020  Discharge Diagnoses:  Principal Problem:   Cerebrovascular accident (CVA) of right basal ganglia (HCC) DVT prophylaxis Hyperlipidemia Hypothyroidism Asthma Glaucoma Hypertension  Discharged Condition: Stable  Significant Diagnostic Studies: DG Chest 2 View  Result Date: 07/21/2020 CLINICAL DATA:  CVA symptoms EXAM: CHEST - 2 VIEW COMPARISON:  None. FINDINGS: Frontal and lateral views of the chest demonstrate an unremarkable cardiac silhouette. Dense atherosclerosis of the aortic arch. No airspace disease, effusion, or pneumothorax. No acute bony abnormalities. IMPRESSION: 1. No acute intrathoracic process. Electronically Signed   By: Sharlet Salina M.D.   On: 07/21/2020 02:18   CT HEAD WO CONTRAST  Result Date: 07/23/2020 CLINICAL DATA:  Stroke follow-up EXAM: CT HEAD WITHOUT CONTRAST TECHNIQUE: Contiguous axial images were obtained from the base of the skull through the vertex without intravenous contrast. COMPARISON:  MRI head 07/22/2020, 07/20/2020 FINDINGS: Brain: Hypodensity right posterior lentiform nucleus again noted compatible with recent infarct. No change in size. No new area of infarct or hemorrhage. Mild atrophy. Chronic microvascular ischemic changes throughout the white matter. Chronic infarct right internal capsule anteriorly. Negative for mass lesion. Vascular: Negative for hyperdense vessel Skull: Negative Sinuses/Orbits: Paranasal sinuses clear. Bilateral cataract extraction Other: None IMPRESSION: Acute infarct in the right lentiform nucleus unchanged from recent MRI. No acute hemorrhage Atrophy and chronic microvascular ischemic change in the white matter. Electronically Signed   By: Marlan Palau M.D.   On: 07/23/2020 13:07   MR BRAIN WO CONTRAST  Result Date: 07/23/2020 CLINICAL DATA:  86 year old female with code  stroke presentation, left side weakness, small vessel ischemia in the posterior right corona radiata and lentiform diagnosed on MRI 07/20/2020. EXAM: LIMITED MRI HEAD WITHOUT CONTRAST TECHNIQUE: Diffusion-weighted imaging only requested by neurology. COMPARISON:  Brain MRI yesterday, 07/20/2020. FINDINGS: Axial and coronal DWI. Restricted diffusion has increased in intensity since 07/20/2020 in the affected area from the posterior right corona radiata through the right lentiform. Compare series 7, image 53 today to series 7, image 53 on 07/20/2020. The area of involvement has mildly expanded. But there is no new restricted diffusion elsewhere. No intracranial mass effect. IMPRESSION: Increased intensity and mildly expanded diffusion restriction from the right posterior corona radiata through the right lentiform since 07/20/2020. No new areas of ischemia.  No intracranial mass effect. Electronically Signed   By: Odessa Fleming M.D.   On: 07/23/2020 15:45   MR BRAIN WO CONTRAST  Result Date: 07/22/2020 CLINICAL DATA:  86 year old female. BMS AXIOMATIC STROKE STUDY PROTOCOL. EXAM: MRI HEAD WITHOUT CONTRAST TECHNIQUE: Multiplanar, multiecho pulse sequences of the brain and surrounding structures were obtained without intravenous contrast. COMPARISON:  Brain MRI 07/20/2020. FINDINGS: Curvilinear restricted diffusion redemonstrated from the posterior corona radiata to the posterior right lentiform. DWI appearance not significantly changed. No associated mass effect. No acute or chronic blood products identified on T2 * GRE. No other restricted diffusion. Increased FLAIR hyperintensity from 2 days ago. Elsewhere stable gray and white matter signal on axial FLAIR and T1 weighted imaging. IMPRESSION: Expected evolution of recent small vessel infarct affecting the posterior right corona radiata through lentiform. No associated hemorrhage or mass effect. Electronically Signed   By: Odessa Fleming M.D.   On: 07/22/2020 21:38   MR BRAIN  WO CONTRAST  Result Date: 07/20/2020 CLINICAL DATA:  86 year old female code stroke presentation, left side weakness. EXAM: MRI HEAD WITHOUT CONTRAST TECHNIQUE: Multiplanar, multiecho pulse sequences  of the brain and surrounding structures were obtained without intravenous contrast. COMPARISON:  CT head, CTA head and neck earlier today. FINDINGS: Brain: Discontinuous curvilinear restricted diffusion is noted from the posterior right corona radiata tracking into the posterior right lentiform, best seen on series 7, image 53). Mild if any associated T2 and FLAIR hyperintensity. No hemorrhage or mass effect. No other restricted diffusion. Patchy and scattered bilateral cerebral white matter T2 and FLAIR hyperintensity elsewhere. No cortical encephalomalacia. No chronic cerebral blood products. Moderate T2 heterogeneity in the bilateral deep gray nuclei, most resembling lacunar infarcts in the basal ganglia. Brainstem and cerebellum are within normal limits for age. No midline shift, mass effect, evidence of mass lesion, ventriculomegaly, extra-axial collection or acute intracranial hemorrhage. Cervicomedullary junction and pituitary are within normal limits. Vascular: Major intracranial vascular flow voids are preserved. Skull and upper cervical spine: Partially visible multilevel cervical spine degeneration. Mild degenerative spinal stenosis suspected at C4-C5. Normal bone marrow signal. Sinuses/Orbits: Postoperative changes to both globes, otherwise negative. Other: Visible internal auditory structures appear normal. Visible scalp and face appear negative. IMPRESSION: 1. Positive for acute small vessel infarct in the posterior right corona radiata and lentiform. No associated hemorrhage or mass effect. 2. Underlying chronic small vessel disease, including in the bilateral basal ganglia. 3. Mild degenerative spinal stenosis suspected at C4-C5. Electronically Signed   By: Odessa Fleming M.D.   On: 07/20/2020 22:47    ECHOCARDIOGRAM COMPLETE  Result Date: 07/21/2020    ECHOCARDIOGRAM REPORT   Patient Name:   Nicole [name] Date of Exam: 07/21/2020 Medical Rec #:  [id number]     Height:       64.0 in Accession #:    [id number]    Weight:       156.0 lb Date of Birth:  [birth year]      BSA:          1.760 m Patient Age:    89 years      BP:           143/79 mmHg Patient Gender: F             HR:           81 bpm. Exam Location:  Inpatient Procedure: 2D Echo Indications:    stroke 434.91  History:        Patient has no prior history of Echocardiogram examinations.                 Risk Factors:Hypertension.  Sonographer:    Celene Skeen RDCS (AE) Referring Phys: [id number] ANASTASSIA DOUTOVA IMPRESSIONS  1. Left ventricular ejection fraction, by estimation, is 60 to 65%. The left ventricle has normal function. The left ventricle has no regional wall motion abnormalities. Left ventricular diastolic parameters are indeterminate.  2. Right ventricular systolic function is normal. The right ventricular size is mildly enlarged.  3. The mitral valve is normal in structure. Mild mitral valve regurgitation.  4. The aortic valve is abnormal. Aortic valve regurgitation is not visualized. Mild aortic valve sclerosis is present, with no evidence of aortic valve stenosis.  5. The inferior vena cava is normal in size with greater than 50% respiratory variability, suggesting right atrial pressure of 3 mmHg. FINDINGS  Left Ventricle: Left ventricular ejection fraction, by estimation, is 60 to 65%. The left ventricle has normal function. The left ventricle has no regional wall motion abnormalities. The left ventricular internal cavity size was normal in size. There is  no left ventricular hypertrophy. Left  ventricular diastolic parameters are indeterminate. Right Ventricle: The right ventricular size is mildly enlarged. No increase in right ventricular wall thickness. Right ventricular systolic function is normal. Left Atrium: Left atrial size was normal  in size. Right Atrium: Right atrial size was normal in size. Pericardium: There is no evidence of pericardial effusion. Mitral Valve: The mitral valve is normal in structure. Mild mitral valve regurgitation. Tricuspid Valve: The tricuspid valve is grossly normal. Tricuspid valve regurgitation is trivial. Aortic Valve: The aortic valve is abnormal. Aortic valve regurgitation is not visualized. Mild aortic valve sclerosis is present, with no evidence of aortic valve stenosis. Pulmonic Valve: The pulmonic valve was not well visualized. Pulmonic valve regurgitation is not visualized. Aorta: The aortic root and ascending aorta are structurally normal, with no evidence of dilitation and the aortic root is normal in size and structure. Venous: The inferior vena cava is normal in size with greater than 50% respiratory variability, suggesting right atrial pressure of 3 mmHg. IAS/Shunts: The interatrial septum was not assessed.  LEFT VENTRICLE PLAX 2D LVIDd:         3.83 cm  Diastology LVIDs:         2.52 cm  LV e' medial:    6.74 cm/s LV PW:         0.81 cm  LV E/e' medial:  9.8 LV IVS:        0.88 cm  LV e' lateral:   7.72 cm/s LVOT diam:     1.90 cm  LV E/e' lateral: 8.5 LV SV:         55 LV SV Index:   31 LVOT Area:     2.84 cm  RIGHT VENTRICLE TAPSE (M-mode): 2.5 cm LEFT ATRIUM             Index       RIGHT ATRIUM           Index LA diam:        3.00 cm 1.70 cm/m  RA Area:     12.80 cm LA Vol (A2C):   53.9 ml 30.62 ml/m RA Volume:   29.10 ml  16.53 ml/m LA Vol (A4C):   24.5 ml 13.92 ml/m LA Biplane Vol: 36.8 ml 20.91 ml/m  AORTIC VALVE LVOT Vmax:   80.30 cm/s LVOT Vmean:  61.000 cm/s LVOT VTI:    0.194 m  AORTA Ao Root diam: 2.80 cm MITRAL VALVE MV Area (PHT): 2.83 cm    SHUNTS MV Decel Time: 268 msec    Systemic VTI:  0.19 m MV E velocity: 66.00 cm/s  Systemic Diam: 1.90 cm MV A velocity: 80.60 cm/s MV E/A ratio:  0.82 Dietrich Pates MD Electronically signed by Dietrich Pates MD Signature Date/Time: 07/21/2020/3:17:28 PM     Final    CT HEAD CODE STROKE WO CONTRAST  Result Date: 07/20/2020 CLINICAL DATA:  Code stroke.  Left-sided weakness and slurred speech EXAM: CT HEAD WITHOUT CONTRAST CT ANGIOGRAPHY OF THE HEAD AND NECK TECHNIQUE: Contiguous axial images were obtained from the base of the skull through the vertex without intravenous contrast. Multidetector CT imaging of the head and neck was performed using the standard protocol during bolus administration of intravenous contrast. Multiplanar CT image reconstructions and MIPs were obtained to evaluate the vascular anatomy. Carotid stenosis measurements (when applicable) are obtained utilizing NASCET criteria, using the distal internal carotid diameter as the denominator. CONTRAST:  25mL OMNIPAQUE IOHEXOL 350 MG/ML SOLN COMPARISON:  None. FINDINGS: CT HEAD FINDINGS Brain: There is no  mass, hemorrhage or extra-axial collection. The size and configuration of the ventricles and extra-axial CSF spaces are normal. There is hypoattenuation of the periventricular white matter, most commonly indicating chronic ischemic microangiopathy. Vascular: No abnormal hyperdensity of the major intracranial arteries or dural venous sinuses. No intracranial atherosclerosis. Skull: The visualized skull base, calvarium and extracranial soft tissues are normal. Sinuses/Orbits: No fluid levels or advanced mucosal thickening of the visualized paranasal sinuses. No mastoid or middle ear effusion. The orbits are normal. ASPECTS ([place] Stroke Program Early CT Score) - Ganglionic level infarction (caudate, lentiform nuclei, internal capsule, insula, M1-M3 cortex): 7 - Supraganglionic infarction (M4-M6 cortex): 3 Total score (0-10 with 10 being normal): 10 CTA NECK FINDINGS SKELETON: There is no bony spinal canal stenosis. No lytic or blastic lesion. OTHER NECK: Normal pharynx, larynx and major salivary glands. No cervical lymphadenopathy. Unremarkable thyroid gland. UPPER CHEST: No pneumothorax or pleural  effusion. No nodules or masses. AORTIC ARCH: There is calcific atherosclerosis of the aortic arch. There is no aneurysm, dissection or hemodynamically significant stenosis of the visualized portion of the aorta. Conventional 3 vessel aortic branching pattern. The visualized proximal subclavian arteries are widely patent. RIGHT CAROTID SYSTEM: No dissection, occlusion or aneurysm. Mild atherosclerotic calcification at the carotid bifurcation without hemodynamically significant stenosis. LEFT CAROTID SYSTEM: No dissection, occlusion or aneurysm. Mild atherosclerotic calcification at the carotid bifurcation without hemodynamically significant stenosis. VERTEBRAL ARTERIES: Left dominant configuration. Both origins are clearly patent. There is no dissection, occlusion or flow-limiting stenosis to the skull base (V1-V3 segments). CTA HEAD FINDINGS POSTERIOR CIRCULATION: --Vertebral arteries: Mild atherosclerotic calcification of the left V4 segment. Otherwise normal. --Inferior cerebellar arteries: Normal. --Basilar artery: Normal. --Superior cerebellar arteries: Normal. --Posterior cerebral arteries (PCA): Normal. ANTERIOR CIRCULATION: --Intracranial internal carotid arteries: Normal. --Anterior cerebral arteries (ACA): Normal. Both A1 segments are present. Patent anterior communicating artery (a-comm). --Middle cerebral arteries (MCA): Normal. VENOUS SINUSES: As permitted by contrast timing, patent. ANATOMIC VARIANTS: Hypoplastic right ACA A1 segment, a common variant. Review of the MIP images confirms the above findings. IMPRESSION: 1. No emergent large vessel occlusion or high-grade stenosis of the head or neck. Aortic Atherosclerosis (ICD10-I70.0). Electronically Signed   By: Deatra Robinson M.D.   On: 07/20/2020 20:34   CT ANGIO HEAD CODE STROKE  Result Date: 07/20/2020 CLINICAL DATA:  Code stroke.  Left-sided weakness and slurred speech EXAM: CT HEAD WITHOUT CONTRAST CT ANGIOGRAPHY OF THE HEAD AND NECK TECHNIQUE:  Contiguous axial images were obtained from the base of the skull through the vertex without intravenous contrast. Multidetector CT imaging of the head and neck was performed using the standard protocol during bolus administration of intravenous contrast. Multiplanar CT image reconstructions and MIPs were obtained to evaluate the vascular anatomy. Carotid stenosis measurements (when applicable) are obtained utilizing NASCET criteria, using the distal internal carotid diameter as the denominator. CONTRAST:  51mL OMNIPAQUE IOHEXOL 350 MG/ML SOLN COMPARISON:  None. FINDINGS: CT HEAD FINDINGS Brain: There is no mass, hemorrhage or extra-axial collection. The size and configuration of the ventricles and extra-axial CSF spaces are normal. There is hypoattenuation of the periventricular white matter, most commonly indicating chronic ischemic microangiopathy. Vascular: No abnormal hyperdensity of the major intracranial arteries or dural venous sinuses. No intracranial atherosclerosis. Skull: The visualized skull base, calvarium and extracranial soft tissues are normal. Sinuses/Orbits: No fluid levels or advanced mucosal thickening of the visualized paranasal sinuses. No mastoid or middle ear effusion. The orbits are normal. ASPECTS [facility] Stroke Program Early CT Score) - Ganglionic level infarction (caudate,  lentiform nuclei, internal capsule, insula, M1-M3 cortex): 7 - Supraganglionic infarction (M4-M6 cortex): 3 Total score (0-10 with 10 being normal): 10 CTA NECK FINDINGS SKELETON: There is no bony spinal canal stenosis. No lytic or blastic lesion. OTHER NECK: Normal pharynx, larynx and major salivary glands. No cervical lymphadenopathy. Unremarkable thyroid gland. UPPER CHEST: No pneumothorax or pleural effusion. No nodules or masses. AORTIC ARCH: There is calcific atherosclerosis of the aortic arch. There is no aneurysm, dissection or hemodynamically significant stenosis of the visualized portion of the aorta.  Conventional 3 vessel aortic branching pattern. The visualized proximal subclavian arteries are widely patent. RIGHT CAROTID SYSTEM: No dissection, occlusion or aneurysm. Mild atherosclerotic calcification at the carotid bifurcation without hemodynamically significant stenosis. LEFT CAROTID SYSTEM: No dissection, occlusion or aneurysm. Mild atherosclerotic calcification at the carotid bifurcation without hemodynamically significant stenosis. VERTEBRAL ARTERIES: Left dominant configuration. Both origins are clearly patent. There is no dissection, occlusion or flow-limiting stenosis to the skull base (V1-V3 segments). CTA HEAD FINDINGS POSTERIOR CIRCULATION: --Vertebral arteries: Mild atherosclerotic calcification of the left V4 segment. Otherwise normal. --Inferior cerebellar arteries: Normal. --Basilar artery: Normal. --Superior cerebellar arteries: Normal. --Posterior cerebral arteries (PCA): Normal. ANTERIOR CIRCULATION: --Intracranial internal carotid arteries: Normal. --Anterior cerebral arteries (ACA): Normal. Both A1 segments are present. Patent anterior communicating artery (a-comm). --Middle cerebral arteries (MCA): Normal. VENOUS SINUSES: As permitted by contrast timing, patent. ANATOMIC VARIANTS: Hypoplastic right ACA A1 segment, a common variant. Review of the MIP images confirms the above findings. IMPRESSION: 1. No emergent large vessel occlusion or high-grade stenosis of the head or neck. Aortic Atherosclerosis (ICD10-I70.0). Electronically Signed   By: Deatra Robinson M.D.   On: 07/20/2020 20:34   CT ANGIO NECK CODE STROKE  Result Date: 07/20/2020 CLINICAL DATA:  Code stroke.  Left-sided weakness and slurred speech EXAM: CT HEAD WITHOUT CONTRAST CT ANGIOGRAPHY OF THE HEAD AND NECK TECHNIQUE: Contiguous axial images were obtained from the base of the skull through the vertex without intravenous contrast. Multidetector CT imaging of the head and neck was performed using the standard protocol during  bolus administration of intravenous contrast. Multiplanar CT image reconstructions and MIPs were obtained to evaluate the vascular anatomy. Carotid stenosis measurements (when applicable) are obtained utilizing NASCET criteria, using the distal internal carotid diameter as the denominator. CONTRAST:  50mL OMNIPAQUE IOHEXOL 350 MG/ML SOLN COMPARISON:  None. FINDINGS: CT HEAD FINDINGS Brain: There is no mass, hemorrhage or extra-axial collection. The size and configuration of the ventricles and extra-axial CSF spaces are normal. There is hypoattenuation of the periventricular white matter, most commonly indicating chronic ischemic microangiopathy. Vascular: No abnormal hyperdensity of the major intracranial arteries or dural venous sinuses. No intracranial atherosclerosis. Skull: The visualized skull base, calvarium and extracranial soft tissues are normal. Sinuses/Orbits: No fluid levels or advanced mucosal thickening of the visualized paranasal sinuses. No mastoid or middle ear effusion. The orbits are normal. ASPECTS ([place] Stroke Program Early CT Score) - Ganglionic level infarction (caudate, lentiform nuclei, internal capsule, insula, M1-M3 cortex): 7 - Supraganglionic infarction (M4-M6 cortex): 3 Total score (0-10 with 10 being normal): 10 CTA NECK FINDINGS SKELETON: There is no bony spinal canal stenosis. No lytic or blastic lesion. OTHER NECK: Normal pharynx, larynx and major salivary glands. No cervical lymphadenopathy. Unremarkable thyroid gland. UPPER CHEST: No pneumothorax or pleural effusion. No nodules or masses. AORTIC ARCH: There is calcific atherosclerosis of the aortic arch. There is no aneurysm, dissection or hemodynamically significant stenosis of the visualized portion of the aorta. Conventional 3 vessel aortic branching  pattern. The visualized proximal subclavian arteries are widely patent. RIGHT CAROTID SYSTEM: No dissection, occlusion or aneurysm. Mild atherosclerotic calcification at the  carotid bifurcation without hemodynamically significant stenosis. LEFT CAROTID SYSTEM: No dissection, occlusion or aneurysm. Mild atherosclerotic calcification at the carotid bifurcation without hemodynamically significant stenosis. VERTEBRAL ARTERIES: Left dominant configuration. Both origins are clearly patent. There is no dissection, occlusion or flow-limiting stenosis to the skull base (V1-V3 segments). CTA HEAD FINDINGS POSTERIOR CIRCULATION: --Vertebral arteries: Mild atherosclerotic calcification of the left V4 segment. Otherwise normal. --Inferior cerebellar arteries: Normal. --Basilar artery: Normal. --Superior cerebellar arteries: Normal. --Posterior cerebral arteries (PCA): Normal. ANTERIOR CIRCULATION: --Intracranial internal carotid arteries: Normal. --Anterior cerebral arteries (ACA): Normal. Both A1 segments are present. Patent anterior communicating artery (a-comm). --Middle cerebral arteries (MCA): Normal. VENOUS SINUSES: As permitted by contrast timing, patent. ANATOMIC VARIANTS: Hypoplastic right ACA A1 segment, a common variant. Review of the MIP images confirms the above findings. IMPRESSION: 1. No emergent large vessel occlusion or high-grade stenosis of the head or neck. Aortic Atherosclerosis (ICD10-I70.0). Electronically Signed   By: Deatra Robinson M.D.   On: 07/20/2020 20:34    Labs:  Basic Metabolic Panel: Recent Labs  Lab 08/11/20 0549 08/15/20 0530  NA 143 140  K 3.6 3.7  CL 109 106  CO2 23 25  GLUCOSE 96 88  BUN 13 16  CREATININE 0.98 0.90  CALCIUM 9.3 9.3    CBC: Recent Labs  Lab 08/15/20 0530  WBC 7.0  NEUTROABS 4.2  HGB 12.0  HCT 38.5  MCV 97.2  PLT 239    CBG: No results for input(s): GLUCAP in the last 168 hours.  Family history.  Mother with diabetes.  Denies any colon cancer esophageal cancer rectal cancer  Brief HPI:   Nicole [name] is a 86 y.o. right-handed female with history of asthma glaucoma hypertension as well as hypothyroidism and  hyperlipidemia.  Patient lives with daughter two-level home bed and bath upstairs.  Independent with ADLs and still driving.  Presented 07/20/2020 with left-sided weakness.  CT MRI showed positive acute small vessel infarct in the posterior right corona radiata and lentiform.  No associated hemorrhage or mass-effect.  CT angiogram of head and neck no emergent large vessel occlusion or high-grade stenosis.  Echocardiogram with ejection fraction of 60 to 65% no wall motion abnormalities.  Admission chemistries unremarkable except glucose 139 potassium 3.9.  Maintained on aspirin and Plavix for CVA prophylaxis as well as BMS investigational med.  Latest follow-up MRI 07/23/2020 showed slight increase intensity and mildly expanded diffusion restriction from the right posterior corona radiata throughout the right lentiform as compared to prior scan 07/20/2020.  No new areas of ischemia.  Tolerating a regular diet.  Patient was admitted for a comprehensive rehab program.   Hospital Course: Nicole [name] was admitted to rehab 07/26/2020 for inpatient therapies to consist of PT, ST and OT at least three hours five days a week. Past admission physiatrist, therapy team and rehab RN have worked together to provide customized collaborative inpatient rehab.  Pertaining to patient's right basal ganglia corona radiata infarction remained on aspirin Plavix therapy as well as BMS investigational medication and follow neurology services.  SCDs for DVT prophylaxis.  Lipitor ongoing for hyperlipidemia.  Hormone supplement for hypothyroidism.  Noted history of asthma inhaler as needed no shortness of breath noted.  She did have overactive bladder continued on Ditropan.  Blood pressure controlled on lisinopril.  She would follow-up with her PCP   Blood pressures were  monitored on TID basis and controlled     Rehab course: During patient's stay in rehab weekly team conferences were held to monitor patient's progress, set goals and  discuss barriers to discharge. At admission, patient required +2 physical assist sit to stand.  Minimal assist upper body bathing max is lower body bathing  Physical exam.  Blood pressure 134/72 pulse 72 temperature 95 respirations 18 oxygen saturation 94% room air Constitutional.  No acute distress HEENT.  Mild left facial droop Eyes.  Pupils round and reactive to light no discharge.nystagmus Neck.  Supple nontender no JVD without thyromegaly Cardiac regular rate rhythm without any extra sounds or murmur heard Abdomen.  Soft nontender positive bowel sounds without rebound Respiratory effort normal no respiratory distress without wheeze Skin.  Warm and dry Neurologic.  Alert no acute distress makes eye contact with examiner provides her name and age but some delay in processing. Right upper extremity right lower extremity 5 -/5 in all muscles tested Left upper and left lower extremity 1/5 biceps triceps wrist extension grip hip flexors knee extension dorsi plantarflexion  /She  has had improvement in activity tolerance, balance, postural control as well as ability to compensate for deficits. Francis Dowse has had improvement in functional use RUE/LUE  and RLE/LLE as well as improvement in awareness.  Supine to sit transfers with heavy use of rail and supervision assist.  Stand pivot transfers to wheelchair and rolling walker moderate assist due to right knee instability.  Patient applied anterior support ridge with AFO provided.  Ambulates 15 feet minimal assistance moderate cues.  Supine to sit with ADLs with flat bed in bed rails contact-guard assist.  Completes dressing with minimal assist for upper body bathing needs assist for lower body ADLs.  Full family teaching completed plan discharge to home       Disposition: Discharge to home    Diet: Regular  Special Instructions: No driving smoking or alcohol  Medications at discharge 1.  Tylenol as needed 2.  Albuterol inhaler 1 puff every 4  hours as needed 3.  Lipitor 80 mg p.o. daily 4.  Vitamin D 1000 units p.o. daily 5.  Voltaren gel 2 g 4 times daily as needed to affected area 6.  Synthroid 50 mcg p.o. daily 7.  Lisinopril 20 mg p.o. daily 8.  Ditropan 5 mg p.o. nightly 9.  Continue BMS experimental drug as per neurology services 10.  Vitamin B12 250 mcg p.o. daily  30-35 minutes were spent completing discharge summary and discharge planning  Discharge Instructions    Ambulatory referral to Neurology   Complete by: As directed    An appointment is requested in approximately 4 weeks left basal ganglia corona radiata infarction   Ambulatory referral to Physical Medicine Rehab   Complete by: As directed    Moderate complexity follow-up 1 to 2 weeks right basal ganglia corona radiata infarction     Allergies as of 08/17/2020   No Known Allergies     Medication List    STOP taking these medications   Investigational - Study Medication   traMADol 50 MG tablet Commonly known as: ULTRAM     TAKE these medications   acetaminophen 325 MG tablet Commonly known as: TYLENOL Take 2 tablets (650 mg total) by mouth every 8 (eight) hours.   atorvastatin 80 MG tablet Commonly known as: LIPITOR Take 1 tablet (80 mg total) by mouth daily.   cetirizine 10 MG tablet Commonly known as: ZYRTEC Take 1 tablet (10 mg total)  by mouth daily as needed for allergies or rhinitis.   diclofenac Sodium 1 % Gel Commonly known as: Voltaren Apply 2 g topically 4 (four) times daily as needed (to affected areas- for pain).   levothyroxine 50 MCG tablet Commonly known as: SYNTHROID Take 1 tablet (50 mcg total) by mouth daily before breakfast.   lisinopril 20 MG tablet Commonly known as: ZESTRIL Take 1 tablet (20 mg total) by mouth daily.   oxybutynin 5 MG 24 hr tablet Commonly known as: DITROPAN-XL Take 1 tablet (5 mg total) by mouth at bedtime.   polyethylene glycol 17 g packet Commonly known as: MIRALAX / GLYCOLAX Take 17  g by mouth daily as needed.   ProAir HFA 108 (90 Base) MCG/ACT inhaler Generic drug: albuterol Inhale 1 puff into the lungs every 4 (four) hours as needed for wheezing or shortness of breath.   senna-docusate 8.6-50 MG tablet Commonly known as: Senokot-S Take 2 tablets by mouth 2 (two) times daily.   vitamin B-12 250 MCG tablet Commonly known as: CYANOCOBALAMIN Take 1 tablet (250 mcg total) by mouth daily. What changed:   medication strength  how much to take   VITAMIN D-3 PO Take 1 capsule by mouth daily. What changed: Another medication with the same name was added. Make sure you understand how and when to take each.   Vitamin D3 25 MCG tablet Commonly known as: Vitamin D Take 1 tablet (1,000 Units total) by mouth daily. What changed: You were already taking a medication with the same name, and this prescription was added. Make sure you understand how and when to take each.       Follow-up Information    Lovorn, Aundra MilletMegan, MD Follow up.   Specialty: Physical Medicine and Rehabilitation Why: Office to call for appointment Contact information: [address] [place] [address] [phone]               Signed: Mcarthur RossettiDaniel J Ilias Stcharles 08/17/2020, 5:03 AM

## 2020-08-15 NOTE — Progress Notes (Signed)
Orthopedic Tech Progress Note Patient Details:  Nicole [name] June 20, 1930 [id number] Called in order to [employer] for an AFO  Patient ID: Nicole [name], female   DOB: Jun 18, 1930, 86 y.o.   MRN: [id number]   Donald Pore 08/15/2020, 2:26 PM

## 2020-08-15 NOTE — Progress Notes (Addendum)
Patient ID: Nicole [name], female   DOB: 04-21-1930, 86 y.o.   MRN: [id number]  Spoke with daughter to discuss difference between home health and OP. She after talking with Tom-OT has decided upon home health and then transitioning to OP. [facility] set up for home health per daughter's preference. Still trying to get the number from [employer] for meals to be delivered to pt's home. Daughter would try also.  12:59 pm referral made to [employer] for wc, rw and 3 in 1 to deliver 10/12.

## 2020-08-15 NOTE — Progress Notes (Addendum)
Physical Therapy Session Note  Patient Details  Name: Nicole [name] MRN: [id number] Date of Birth: Mar 19, 1930  Today's Date: 08/15/2020 PT Individual Time: 1300-1410; 1600-1630 PT Individual Time Calculation (min): 70 min and 30 min  Short Term Goals: Week 2:  PT Short Term Goal 1 (Week 2): pt to demonstrate gait training with LRAD 50' min A consistently PT Short Term Goal 2 (Week 2): pt to demo static sitting balance without trunk support at CGA for 10 mins PT Short Term Goal 3 (Week 2): pt to demo dynamic standing balance CGA PT Short Term Goal 4 (Week 2): pt to mobilize [facility] for 75' min A  Skilled Therapeutic Interventions/Progress Updates:    Session 1: Pt received seated in w/c in room, daughter present for hands-on family education session. No complaints of pain. Reviewed management of w/c parts including w/c brakes, leg rests, and arm rest management. Demonstrated safe setup and performance of squat pivot transfer w/c to/from level mat table. Recommending removal of box springs from patient's bed for more level transfer. Pt is mod A for squat pivot transfer this date. [name] able to perform return demonstration of squat pivot transfer. Car transfer via squat pivot and stand pivot transfer, pt appears to be safer performing car transfer with stand pivot transfer and mod A at this time. Reviewed how to ascend/descend one curb step with w/c and anti-tipper management. [name] demonstrates good return demo of w/c management up one step. Pt requests to return to bed at end of session. Squat pivot transfer back to bed with mod A. Sit to supine mod A for BLE management. Pt left seated in bed with needs in reach, bed alarm in place at end of session. [name] with no further questions and reports feeling comfortable with pt's current assist level.  Session 2: Pt received seated in bed, agreeable to PT session. No complaints of pain. Bed mobility min A for some LLE management. Squat  pivot transfer to w/c with mod A. Dependent transport via w/c to/from therapy gym for time conservation. Sit to stand with max A to RW this date. Ambulation 2 x 15 ft with RW with mod A for balance, some assist to prevent LLE adduction and for placement and to block L knee even with use of AFO. Pt also requires continued assist to weight shift to the L in standing. Pt requires w/c follow for safety due to onset of fatigue and attempting to sit before chair is behind her. Pt agreeable to stay seated up in w/c at end of session, quick release belt and chair alarm in place, needs in reach.  Therapy Documentation Precautions:  Precautions Precautions: Fall Precaution Comments: Left side weakness. Inattention to L Restrictions Weight Bearing Restrictions: No    Therapy/Group: Individual Therapy   Peter Congo, PT, DPT  08/15/2020, 5:09 PM

## 2020-08-16 ENCOUNTER — Other Ambulatory Visit (HOSPITAL_COMMUNITY): Payer: Self-pay | Admitting: Physician Assistant

## 2020-08-16 ENCOUNTER — Inpatient Hospital Stay (HOSPITAL_COMMUNITY): Payer: Medicare Other

## 2020-08-16 ENCOUNTER — Inpatient Hospital Stay (HOSPITAL_COMMUNITY): Payer: Medicare Other | Admitting: Occupational Therapy

## 2020-08-16 ENCOUNTER — Inpatient Hospital Stay (HOSPITAL_COMMUNITY): Payer: Medicare Other | Admitting: Physical Therapy

## 2020-08-16 MED ORDER — LISINOPRIL 20 MG PO TABS
20.0000 mg | ORAL_TABLET | Freq: Every day | ORAL | Status: DC
  Administered 2020-08-17: 20 mg via ORAL
  Filled 2020-08-16: qty 1

## 2020-08-16 MED ORDER — LISINOPRIL 10 MG PO TABS: 10.0000 mg | ORAL_TABLET | Freq: Every day | ORAL | 0 refills | Status: DC

## 2020-08-16 MED ORDER — VITAMIN D3 25 MCG PO TABS: 1000.0000 [IU] | ORAL_TABLET | Freq: Every day | ORAL | 0 refills | Status: DC

## 2020-08-16 MED ORDER — PROAIR HFA 108 (90 BASE) MCG/ACT IN AERS: 1.0000 | INHALATION_SPRAY | RESPIRATORY_TRACT | 0 refills | Status: DC | PRN

## 2020-08-16 MED ORDER — DICLOFENAC SODIUM 1 % EX GEL: 2.0000 g | Freq: Four times a day (QID) | CUTANEOUS | 0 refills | Status: DC | PRN

## 2020-08-16 MED ORDER — LEVOTHYROXINE SODIUM 50 MCG PO TABS: 50.0000 ug | ORAL_TABLET | Freq: Every day | ORAL | 0 refills | Status: AC

## 2020-08-16 MED ORDER — ACETAMINOPHEN 325 MG PO TABS: 650.0000 mg | ORAL_TABLET | Freq: Three times a day (TID) | ORAL | Status: AC

## 2020-08-16 MED ORDER — LISINOPRIL 20 MG PO TABS: 20.0000 mg | ORAL_TABLET | Freq: Every day | ORAL | 0 refills | Status: DC

## 2020-08-16 MED ORDER — CYANOCOBALAMIN 250 MCG PO TABS: 250.0000 ug | ORAL_TABLET | Freq: Every day | ORAL | 0 refills | Status: AC

## 2020-08-16 MED ORDER — ATORVASTATIN CALCIUM 80 MG PO TABS: 80.0000 mg | ORAL_TABLET | Freq: Every day | ORAL | 1 refills | Status: DC

## 2020-08-16 MED ORDER — OXYBUTYNIN CHLORIDE ER 5 MG PO TB24: 5.0000 mg | ORAL_TABLET | Freq: Every day | ORAL | 0 refills | Status: DC

## 2020-08-16 MED FILL — VITAMIN D3 25 MCG TABS: 25 | 30 days supply | Qty: 30 | Fill #0

## 2020-08-16 MED FILL — ATORVASTATIN CALCIUM 80 MG: 80 | 45 days supply | Qty: 45 | Fill #0

## 2020-08-16 MED FILL — LISINOPRIL 20 MG TABLET: 20 | 30 days supply | Qty: 30 | Fill #0

## 2020-08-16 MED FILL — ALBUTEROL SULFATE HFA 108 (: 108 (90 BAS | 20 days supply | Qty: 9 | Fill #0

## 2020-08-16 MED FILL — DICLOFENAC SODIUM 1 % GEL: 1 | 20 days supply | Qty: 200 | Fill #0

## 2020-08-16 MED FILL — VITAMIN B-12 1000 MCG TABS: 1000 | 30 days supply | Qty: 30 | Fill #0

## 2020-08-16 NOTE — Progress Notes (Signed)
Occupational Therapy Discharge Summary  Patient Details  Name: Nicole [name] MRN: [id number] Date of Birth: 06/19/1930  Patient has met 10 of 11 long term goals due to improved activity tolerance, improved balance, postural control, ability to compensate for deficits, functional use of  LEFT upper extremity, improved awareness and improved coordination.  Patient to discharge at [facility] Assist level.  Patient's care partner is independent to provide the necessary physical assistance at discharge. Pt is mostly Min and occasionally Mod for squat pivot transfers for ADLs, Min-Mod for sit to stands, and Min overall for bathing and dressing tasks with compensatory and hemidressing techniques.   Reasons goals not met: Pt requires Min A for UB dressing to don bra and shirt with hemidressing technique. Otherwise all goals met.   Recommendation:  Patient will benefit from ongoing skilled OT services in home health setting to continue to advance functional skills in the area of BADL and Reduce care partner burden.  Equipment: BSC  Reasons for discharge: treatment goals met and discharge from hospital  Patient/family agrees with progress made and goals achieved: Yes  OT Discharge Vision Baseline Vision/History: Wears glasses Wears Glasses: Reading only Patient Visual Report: No change from baseline Vision Assessment?: Yes Eye Alignment: Within Functional Limits Ocular Range of Motion: Within Functional Limits Alignment/Gaze Preference: Within Defined Limits Tracking/Visual Pursuits: Requires cues, head turns, or add eye shifts to track;Decreased smoothness of vertical tracking;Decreased smoothness of horizontal tracking Saccades: Decreased speed of saccadic movement Convergence: Within functional limits Perception  Inattention/Neglect: Other (comment) (decreased L attention; corrected with verbal cues) Praxis Praxis: Impaired Praxis Impairment Details: Initiation;Motor  planning Cognition Overall Cognitive Status: Within Functional Limits for tasks assessed Arousal/Alertness: Awake/alert Orientation Level: Oriented X4 Attention: Selective Focused Attention: Appears intact Sustained Attention: Appears intact Selective Attention: Appears intact Memory: Appears intact Awareness: Appears intact Awareness Impairment: Anticipatory impairment Problem Solving: Appears intact Problem Solving Impairment: Functional basic Safety/Judgment: Appears intact Sensation Sensation Light Touch: Appears Intact Light Touch Impaired Details: Impaired LUE Hot/Cold: Appears Intact Proprioception: Impaired by gross assessment Coordination Gross Motor Movements are Fluid and Coordinated: No Fine Motor Movements are Fluid and Coordinated: No Coordination and Movement Description: impaired 2/2 L hemi Finger Nose Finger Test: WFL on the R, unable on the L Motor  Motor Motor: Hemiplegia;Abnormal tone;Abnormal postural alignment and control Mobility  Bed Mobility Supine to Sit: Minimal Assistance - Patient > 75% Sit to Supine: Minimal Assistance - Patient > 75% Transfers Sit to Stand: Moderate Assistance - Patient 50-74%  Trunk/Postural Assessment  Cervical Assessment Cervical Assessment:  (forward head) Thoracic Assessment Thoracic Assessment:  (kyphotic) Lumbar Assessment Lumbar Assessment:  (posterior pelvic tilt) Postural Control Postural Control: Deficits on evaluation Trunk Control: impaired, L lateral lean Righting Reactions: delayed/insufficient  Balance Static Sitting Balance Static Sitting - Balance Support: Feet supported Static Sitting - Level of Assistance: 5: Stand by assistance Dynamic Sitting Balance Dynamic Sitting - Balance Support: During functional activity Dynamic Sitting - Level of Assistance: 5: Stand by assistance Extremity/Trunk Assessment RUE Assessment RUE Assessment: Within Functional Limits LUE Assessment LUE Assessment:  Exceptions to [facility] LUE Body System: Neuro Brunstrum level for arm: Stage IV Movement is deviating from synergy Brunstrum level for hand: Stage V Independence from basic synergies   Leroy Libman 08/16/2020, 6:35 AM

## 2020-08-16 NOTE — Progress Notes (Signed)
Physical Therapy Session Note  Patient Details  Name: Nicole [name] MRN: [id number] Date of Birth: 1930/05/21  Today's Date: 08/16/2020 PT Individual Time: 6384-5364; 1445-1530 PT Individual Time Calculation (min): 45 min and 45 min  Short Term Goals: Week 3:  PT Short Term Goal 1 (Week 3): =LTG due to ELOS  Skilled Therapeutic Interventions/Progress Updates:    Session 1: Pt received seated in w/c in room, agreeable to PT session. No complaints of pain. Orthotist Scarlette Slice present for AFO assessment. Pt is mod A for sit to stand to RW. Ambulation x 10 ft with no bracing and min to mod A for balance with use of RW with L hand splint. Pt exhibits assist needed for LLE advancement and placement, toe catching, and decreased knee control in stance. Trial gait with L GRAFO x 10 ft with min to mod A and RW. Pt exhibits improved control of LLE during gait with ongoing toe catching and decreased heel contact in stance with flexed knee and hip. Trial gait x 10 ft with L GRAFO and heel wedge with RW and min to mod A. Pt exhibits improved heel contact during gait with use of heel wedge. Orthotist to provide L GRAFO, heel wedge, and place toe cap on pt's L shoe for improved gait mechanics. OA also very limiting with regards to LLE and gait. Pt left seated in w/c in room with needs in reach, quick release belt and chair alarm in place at end of session.  Session 2: Pt received seated in w/c in room, agreeable to PT session. No complaints of pain. Pt's equipment she will d/c home with arrived to her room. Assisted pt with setting up RW to appropriate height and placing L hand splint on her RW. Squat pivot transfer w/c to/from bed with mod A to new w/c. Manual w/c propulsion x 100 ft with use of R UE/LE with min A needed for steering this date. Attempt sit to stand to RW, pt significantly fatigued this PM and demonstrates poor ability to perform standing safely this PM. Pt left seated in w/c in room with needs in  reach, quick release belt and chair alarm in place at end of session.  Therapy Documentation Precautions:  Precautions Precautions: Fall Precaution Comments: Left side weakness. Inattention to L Restrictions Weight Bearing Restrictions: No    Therapy/Group: Individual Therapy   Peter Congo, PT, DPT  08/16/2020, 2:27 PM

## 2020-08-16 NOTE — Patient Care Conference (Signed)
Inpatient RehabilitationTeam Conference and Plan of Care Update Date: 08/16/2020   Time: 11:09 AM    Patient Name: Nicole [name]      Medical Record Number: [id number]  Date of Birth: 1930/10/23 Sex: Female         Room/Bed: 4W04C/4W04C-01 Payor Info: Payor: [employer]ICARE / Plan: [employer] MEDICARE / Product Type: *No Product type* /    Admit Date/Time:  07/26/2020  5:45 PM  Primary Diagnosis:  Cerebrovascular accident (CVA) of right basal ganglia [facility])  Hospital Problems: Principal Problem:   Cerebrovascular accident (CVA) of right basal ganglia [facility])    Expected Discharge Date: Expected Discharge Date: 08/17/20  Team Members Present: Physician leading conference: Dr. Genice Rouge Care Coodinator Present: Cecile Sheerer, LCSWA;Efren Kross Marlyne Beards, RN, BSN, CRRN Nurse Present: Other (comment) Abundio Miu, RN) PT Present: Peter Congo, PT OT Present: Roney Mans, OT;Ardis Rowan, COTA PPS Coordinator present : Edson Snowball, Park Breed, SLP     Current Status/Progress Goal Weekly Team Focus  Bowel/Bladder             Swallow/Nutrition/ Hydration             ADL's   bathing/dressing-min A; toileting-min A; toilet transfers-min A  min A overall  discharge planning; safety awareness, functional tranfsers   Mobility   min/mod bed mobility, min/mod squat pivot, mod/max sit to stand, gait up to 25 ft with RW and mod A, Supervision w/c mobility  min A overall, short distance gait, Supervision w/c mobility  family education, d/c planning   Communication             Safety/Cognition/ Behavioral Observations            Pain             Skin               Discharge Planning:  Pt to d/c to home with her dtr [name]. She currently works a hybrid work schedule and is working towards hopefully being able to work from home to help assist her mother. Fam edu completed on Wed (10/6).   Team Discussion: Continent B/B, skin CDI. OT reports pt is at goal  level. PT reports pt is at goal level. Equipment has been delivered to the room. Family education is complete.  Patient on target to meet rehab goals: yes  *See Care Plan and progress notes for long and short-term goals.   Revisions to Treatment Plan:  N/A  Teaching Needs: Family education complete  Current Barriers to Discharge: Home enviroment access/layout, Lack of/limited family support, Weight and Medication compliance  Possible Resolutions to Barriers: MD increased Lisinopril to 20 mg daily. Continue current medication regimen.     Medical Summary Current Status: L knee pain gone/occ- voltaren/tylenol for it; no wounds; continent B/B- BP elevated  Barriers to Discharge: Home enviroment access/layout;Decreased family/caregiver support;Medical stability;Weight  Barriers to Discharge Comments: BP 140s/150s sytolic- increased lisinopril- d/c Wednesdaytomorrow Possible Resolutions to [employer]: increased lisinopril to 20 mg daily-   Continued Need for Acute Rehabilitation Level of Care: The patient requires daily medical management by a physician with specialized training in physical medicine and rehabilitation for the following reasons: Direction of a multidisciplinary physical rehabilitation program to maximize functional independence : Yes Medical management of patient stability for increased activity during participation in an intensive rehabilitation regime.: Yes Analysis of laboratory values and/or radiology reports with any subsequent need for medication adjustment and/or medical intervention. : Yes   I  attest that I was present, lead the team conference, and concur with the assessment and plan of the team.   Tennis Must 08/16/2020, 3:34 PM

## 2020-08-16 NOTE — Progress Notes (Signed)
Physical Therapy Discharge Summary  Patient Details  Name: Nicole [name] MRN: [id number] Date of Birth: 04/16/1930   Patient has met 1 of 7 long term goals due to improved activity tolerance, improved balance, improved postural control, increased strength and ability to compensate for deficits.  Patient to discharge at a wheelchair level [place].   Patient's care partner is independent to provide the necessary physical assistance at discharge. Patient's daughter has completed hands-on family education and reports feeling comfortable with providing the current level of assist that the patient requires.  Reasons goals not met: Pt's goals were set to Supervision and min A level and she still requires up to mod A at times due to fatigue. [name] has completed hands on family education and reports she feels comfortable taking pt home at her current assist level.  Recommendation:  Patient will benefit from ongoing skilled PT services in home health setting to continue to advance safe functional mobility, address ongoing impairments in endurance, strength, safety, sensory, independence with functional mobility, balance, and minimize fall risk.  Equipment: 16x16 w/c, RW  Reasons for discharge: treatment goals met and discharge from hospital  Patient/family agrees with progress made and goals achieved: Yes  PT Discharge Precautions/Restrictions Precautions Precautions: Fall Precaution Comments: Left side weakness. Inattention to L Restrictions Weight Bearing Restrictions: No Vision/Perception  Perception Perception: Impaired Inattention/Neglect: Other (comment) (decreased L attention) Praxis Praxis: Impaired Praxis Impairment Details: Initiation;Motor planning  Cognition Overall Cognitive Status: Within Functional Limits for tasks assessed Arousal/Alertness: Awake/alert Orientation Level: Oriented X4 Attention: Selective Focused Attention: Appears intact Sustained Attention:  Appears intact Selective Attention: Appears intact Memory: Appears intact Awareness: Impaired Awareness Impairment: Anticipatory impairment Problem Solving: Appears intact Safety/Judgment: Appears intact Sensation Sensation Light Touch: Appears Intact Proprioception: Appears Intact Coordination Gross Motor Movements are Fluid and Coordinated: No Fine Motor Movements are Fluid and Coordinated: No Coordination and Movement Description: impaired 2/2 L hemi Heel Shin Test: unable on L Motor  Motor Motor: Hemiplegia;Abnormal tone;Abnormal postural alignment and control Motor - Skilled Clinical Observations: L hemi Motor - Discharge Observations: L hemi  Mobility Bed Mobility Bed Mobility: Rolling Right;Rolling Left;Supine to Sit;Sit to Supine Rolling Right: Supervision/verbal cueing Rolling Left: Supervision/Verbal cueing Supine to Sit: Minimal Assistance - Patient > 75% Sit to Supine: Minimal Assistance - Patient > 75% Transfers Transfers: Squat Pivot Transfers Sit to Stand: Moderate Assistance - Patient 50-74% Squat Pivot Transfers: Moderate Assistance - Patient 50-74% Locomotion  Gait Ambulation: Yes Gait Assistance: 2 Helpers Gait Distance (Feet): 15 Feet Assistive device: Rolling walker Gait Assistance Details: Verbal cues for sequencing;Verbal cues for technique;Verbal cues for precautions/safety;Verbal cues for gait pattern;Verbal cues for safe use of DME/AE;Manual facilitation for weight shifting;Manual facilitation for placement Gait Gait: Yes Gait Pattern: Impaired Gait Pattern: Step-to pattern;Decreased step length - right;Decreased step length - left;Decreased dorsiflexion - left;Decreased weight shift to left;Left flexed knee in stance Gait velocity: decreased Stairs / Additional Locomotion Stairs: No Wheelchair Mobility Wheelchair Mobility: Yes Wheelchair Assistance: Minimal assistance - Patient >75% Wheelchair Propulsion: Right upper extremity;Right lower  extremity Wheelchair Parts Management: Needs assistance Distance: 100  Trunk/Postural Assessment  Cervical Assessment Cervical Assessment: Exceptions to [facility] (forward head) Thoracic Assessment Thoracic Assessment: Exceptions to [facility] (kyphotic) Lumbar Assessment Lumbar Assessment: Exceptions to [facility] (posterior pelvic tilt) Postural Control Postural Control: Deficits on evaluation Trunk Control: impaired, L lateral lean Righting Reactions: delayed/insufficient Postural Limitations: impaired  Balance Balance Balance Assessed: Yes Static Sitting Balance Static Sitting - Balance Support: Feet supported Static Sitting -  Level of Assistance: 5: Stand by assistance Dynamic Sitting Balance Dynamic Sitting - Balance Support: During functional activity Dynamic Sitting - Level of Assistance: 5: Stand by assistance Static Standing Balance Static Standing - Balance Support: Bilateral upper extremity supported;During functional activity Static Standing - Level of Assistance: 4: Min assist Extremity Assessment   RLE Assessment RLE Assessment: Within Functional Limits General Strength Comments: 5/5 grossly LLE Assessment LLE Assessment: Exceptions to [facility] General Strength Comments: impaired, see below LLE Strength Left Hip Flexion: 3/5 Left Knee Flexion: 2-/5 Left Knee Extension: 2-/5 Left Ankle Dorsiflexion: 1/5     Excell Seltzer, PT, DPT 08/16/2020, 3:38 PM

## 2020-08-16 NOTE — Progress Notes (Signed)
Occupational Therapy Session Note  Patient Details  Name: Nicole [name] MRN: [id number] Date of Birth: [birth year]  Today's Date: 08/16/2020 OT Individual Time: 1015-1055 OT Individual Time Calculation (min): 40 min    Short Term Goals: Week 2:  OT Short Term Goal 1 (Week 2): Pt will complete UB dressing with min A OT Short Term Goal 1 - Progress (Week 2): Progressing toward goal OT Short Term Goal 2 (Week 2): Pt will perform toilet transfers with mod A OT Short Term Goal 2 - Progress (Week 2): Met OT Short Term Goal 3 (Week 2): Pt will complete LB dressing tasks with max A with sit<>stand OT Short Term Goal 3 - Progress (Week 2): Met OT Short Term Goal 4 (Week 2): Pt will complete toileting tasks with max A OT Short Term Goal 4 - Progress (Week 2): Progressing toward goal  Skilled Therapeutic Interventions/Progress Updates:    Pt resting in bed upon arrival and agreeable to getting OOB. Supine>sit EOB with min A. Squat pivot transfer to w/c with min A. LUE NMR per below.  Pt with increased elbow flexion, shoulder horizontal adduction, and shoulder flexion.  Pt continues to require min tactile facilitation with shoulder flexion and verbal cues for finger extension.  Per request, therapist called pt's daughter. [name] inquiring about use of Stedy in home envirionment.  Discussed pros and cons with daughter but did not make formal recommendation. Pt remained in w/c with belt alarm actiated, half lap tray in place, and all needs within reach.   Therapy Documentation Precautions:  Precautions Precautions: Fall Precaution Comments: Left side weakness. Inattention to L Restrictions Weight Bearing Restrictions: No Pain:  Pt denies pain   Other Treatments: Treatments Neuromuscular Facilitation: Left;Upper Extremity;Activity to increase motor control;Activity to increase timing and sequencing;Activity to increase sustained activation Weight Bearing Technique Weight Bearing Technique:  Yes LUE Weight Bearing Technique: Forearm seated;Extended arm seated   Therapy/Group: Individual Therapy  Leroy Libman 08/16/2020, 12:08 PM

## 2020-08-16 NOTE — Progress Notes (Signed)
Patient ID: Nicole [name], female   DOB: 08/06/1930, 86 y.o.   MRN: [id number]  SW met with pt in room to review her d/c. SW confirms DME in pt room. SW spoke with pt dtr [name] [phone]) to discuss discharge. SW informed will continue to work on getting a number for [employer] delivered meals.   Loralee Pacas, MSW, [place] Office: [phone] Cell: [phone] Fax: [phone]

## 2020-08-16 NOTE — Progress Notes (Signed)
[facility] PHYSICAL MEDICINE & REHABILITATION PROGRESS NOTE   Subjective/Complaints:  Pt very happy about current bladder medicine- Oxybutynin long acting- very helpful for bladder Sx's compared to previous medicine she took.   LBM yesterday   ROS:   Pt denies SOB, abd pain, CP, N/V/C/D, and vision changes  Objective:   No results found. Recent Labs    08/15/20 0530  WBC 7.0  HGB 12.0  HCT 38.5  PLT 239   Recent Labs    08/15/20 0530  NA 140  K 3.7  CL 106  CO2 25  GLUCOSE 88  BUN 16  CREATININE 0.90  CALCIUM 9.3    Intake/Output Summary (Last 24 hours) at 08/16/2020 1036 Last data filed at 08/16/2020 0726 Gross per 24 hour  Intake 714 ml  Output --  Net 714 ml        Physical Exam: Vital Signs Blood pressure (!) 152/73, pulse 90, temperature 98.1 F (36.7 C), resp. rate 16, height 5\' 5"  (1.651 m), weight 66.6 kg, SpO2 97 %. General: awake, sitting up in w/c with OT in room, NAD HEENT: conjugate gaze Neck: Supple without JVD or lymphadenopathy Heart: RRR Chest: CTA B/L- no W/R/R- good air movement Abdomen: Soft, NT, ND, (+)BS  Musculoskeletal:     Cervical back: L knee not TTP anymore- no swelling,     Comments: RUE/RLE- 5-/5 in all muscles tested LUE biceps, WE, triceps, and grip 3/5 Skin:    General: Skin is warm and dry.     Comments: IV L antecubital fossa- out   Left heel not visualized today Neurological: Alert;  fair awareness of her deficits. Decreased sensation to light touch in LUE/LLE- facial sensation normal per pt Not using L side still unless forced- OT making her use this AM   Assessment/Plan: 1. 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  Physiatrist is providing close team supervision and 24 hour management of active medical problems listed below.  Physiatrist and rehab team continue to  assess barriers to discharge/monitor patient progress toward functional and medical goals  Care Tool:  Bathing    Body parts bathed by patient: Chest, Abdomen, Face, Right upper leg, Left upper leg, Left arm, Right lower leg, Front perineal area, Left lower leg, Right arm, Buttocks   Body parts bathed by helper: Right arm     Bathing assist Assist Level: Minimal Assistance - Patient > 75%     Upper Body Dressing/Undressing Upper body dressing   What is the patient wearing?: Bra, Pull over shirt    Upper body assist Assist Level: Minimal Assistance - Patient > 75%    Lower Body Dressing/Undressing Lower body dressing      What is the patient wearing?: Pants, Underwear/pull up     Lower body assist Assist for lower body dressing: Minimal Assistance - Patient > 75%     Toileting Toileting Toileting Activity did not occur (Clothing management and hygiene only): N/A (no void or bm)  Toileting assist Assist for toileting: Minimal Assistance - Patient > 75%     Transfers Chair/bed transfer  Transfers assist  Chair/bed transfer activity did not occur: Refused  Chair/bed transfer assist level: Moderate Assistance - Patient 50 - 74%     Locomotion Ambulation   Ambulation assist   Ambulation activity did not occur: Safety/medical concerns  Assist level: 2 helpers Assistive device: Walker-rolling Max distance: 82'  Walk 10 feet activity   Assist  Walk 10 feet activity did not occur: Safety/medical concerns  Assist level: 2 helpers Assistive device: Walker-rolling   Walk 50 feet activity   Assist Walk 50 feet with 2 turns activity did not occur: Safety/medical concerns         Walk 150 feet activity   Assist Walk 150 feet activity did not occur: Safety/medical concerns         Walk 10 feet on uneven surface  activity   Assist Walk 10 feet on uneven surfaces activity did not occur: Safety/medical concerns          Wheelchair     Assist Will patient use wheelchair at discharge?: Yes Type of Wheelchair: Manual Wheelchair activity did not occur: Safety/medical concerns  Wheelchair assist level: Supervision/Verbal cueing Max wheelchair distance: 100'    Wheelchair 50 feet with 2 turns activity    Assist    Wheelchair 50 feet with 2 turns activity did not occur: Safety/medical concerns   Assist Level: Contact Guard/Touching assist   Wheelchair 150 feet activity     Assist  Wheelchair 150 feet activity did not occur: Safety/medical concerns       Blood pressure (!) 152/73, pulse 90, temperature 98.1 F (36.7 C), resp. rate 16, height 5\' 5"  (1.651 m), weight 66.6 kg, SpO2 97 %.  Medical Problem List and Plan: 1.  Left-sided weakness/almost hemiplegia secondary to right basal ganglia and corona radiata infarct             -patient may  shower             -ELOS/Goals: 14-18 days- goals CGA- mod I   9/27- making strength exam gains every few days  10/9- reading paper   -Continue CIR 2.  Antithrombotics: -DVT/anticoagulation: SCDs             -antiplatelet therapy: Aspirin 81 mg daily Plavix 85 mg daily and BMS investigational med 3. Pain Management:   10/12- pain controlled- con't tylenol prn 4. Mood: Provide emotional support             -antipsychotic agents: N/A 5. Neuropsych: This patient is capable of making decisions on her own behalf. 6. Skin/Wound Care: Routine skin checks 7. Fluids/Electrolytes/Nutrition: Routine in and outs with follow-up chemistries  9/22- labs look good on admission and were great on repeat on 9/27 8.  Hyperlipidemia.  Lipitor 80mg  daily 9.  Hypothyroidism.  Synthroid 10.  Asthma.  Continue nhaler as needed 11.  Overactive bladder.  Ditropan  10/12- per pt, working great and doesn't want to go back to "old medicine".  12.  Constipation.  MiraLAX twice daily as well as Senokot 2 tablets twice daily.  9/26 had results with sorbitol, meds.  Stools now loose   -hold miralax for now  10/12- LBM yesterday -con't regimen 13.Glaucoma.pt says she doesn't take drops and doesn't have this dx.   9/22- will check with family.  14. L foot drop- will order PRAFO.  9/22- has been ordered- not here yet.  9/23- didn't see in room- will check into    9/30: in room, but patient states she has not been wearing. Will place nursing order. 15. HTN-  10/2: Uncontrolled. Kidney function is normal Add Lisinopril 2.5mg .   10/3: Uncontrolled. Increase Lisinopril to 5mg .   10/4-BP better controlled- con't meds  10/5- BP 160s/70s this AM- had been doing better- will monitor before changing meds  10/6- BP 151/71 this AM  10/7-  BP 140s/70s- con't regimen  10/8- BP 125/70- doing better  10/9- BP 144/64- increase lisinopril 10 mg daily.  10/10- BP 149/64- but last 2 BP's 120s/130s systolic- won't make changes as of yet   10/11: reviewed BP with daughter and recommended high potassium whole food diet at home, checking BP daily, and bringing log to f/u appointment  10/12- BP has been running higher last few days- will increase Lisinopril to 20 mg daily.  16. Poor appetite  9/23- will check CMP in AM- to see total protein and albumin- and check a prealbumin- I'M concerned she's eating <25% of meals (so far only seen breakfast).   9/24- pt's prealbumin is 30 (18-38), Albumin is good at 3.9 and total protein good at 8.5- con't regimen 17. Hypokalemia  9/27- K+ 3.9  10/4- K+ 3.5- low normal-   10/6- K+ recheck Thursday  10/9- K+ 3.6  10/11: discussed trends with patient and daughter  71. Impaired cognition: continue SLP 19. Left sided inattention: continue therapy, education.  10/7- think about amantadine  10/8- will d/w OT/PT.    10/12- they aren't sure would be helpful     LOS: 21 days A FACE TO FACE EVALUATION WAS PERFORMED  Saharra Santo 08/16/2020, 10:36 AM

## 2020-08-16 NOTE — Progress Notes (Signed)
Occupational Therapy Session Note  Patient Details  Name: Nicole [name] MRN: [id number] Date of Birth: [birth year]  Today's Date: 08/16/2020 OT Individual Time: 5956-3875 OT Individual Time Calculation (min): 72 min    Short Term Goals: Week 3:  OT Short Term Goal 1 (Week 3): STG=LTG secondary to ELOS  Skilled Therapeutic Interventions/Progress Updates:    Pt greeted at time of session reclined in bed, agreeable to OT session, no c/o pain. Supine to sit Min, Sit to stand min/mod from bed level and stand pivot with RW Mod A to wheelchair, pt aware that daughter has been trained on squat pivots for safety during ADLs. Set up at sink level (pt requested today instead of shower level bathing) and min a to doff gown. Min A for UB/LB bathing at sink level with hand over hand assist to use RUE to help LUE perform tasks, note that pt has more functional movement in LUE compared to previous sessions. Pt able to wash periarea in sitting, and buttocks with lateral leans. Dried off in same manner. UB/LB dress Min overall with hemidressing techniques, sit to stand with Min A to don over hips with extended time and unilateral support on sink surface. Note therapist facilitated use of LUE as a functional assist throughout session. Daughter also called during session, questions answered and passed along to appropriate staff. Stand pivot back to bed with Min/Mod with RW toward her R side, extended time to perform pivoting and coordinating feet. Sit to supine Min A. Alarm on, call bell in reach.    Therapy Documentation Precautions:  Precautions Precautions: Fall Precaution Comments: Left side weakness. Inattention to L Restrictions Weight Bearing Restrictions: No     Therapy/Group: Individual Therapy  Erasmo Score 08/16/2020, 8:43 AM

## 2020-08-17 NOTE — Progress Notes (Signed)
[facility] PHYSICAL MEDICINE & REHABILITATION PROGRESS NOTE   Subjective/Complaints:  LBM yesterday- d/c home today- very happy about that- no issues.    ROS:    Pt denies SOB, abd pain, CP, N/V/C/D, and vision changes   Objective:   No results found. Recent Labs    08/15/20 0530  WBC 7.0  HGB 12.0  HCT 38.5  PLT 239   Recent Labs    08/15/20 0530  NA 140  K 3.7  CL 106  CO2 25  GLUCOSE 88  BUN 16  CREATININE 0.90  CALCIUM 9.3    Intake/Output Summary (Last 24 hours) at 08/17/2020 0823 Last data filed at 08/17/2020 0745 Gross per 24 hour  Intake 222 ml  Output --  Net 222 ml        Physical Exam: Vital Signs Blood pressure 138/64, pulse 69, temperature 97.8 F (36.6 C), resp. rate 16, height 5\' 5"  (1.651 m), weight 66.6 kg, SpO2 (!) 87 %. General: awake, sitting up in bed; RN in room, NAD HEENT: conjugate gaze Neck: Supple without JVD or lymphadenopathy Heart: RRR Chest: CTA B/L- no W/R/R- good air movement Abdomen: Soft, NT, ND, (+)BS   Musculoskeletal:     Cervical back: L knee not TTP anymore- no swelling,     Comments: RUE/RLE- 5-/5 in all muscles tested LUE biceps, WE, triceps, and grip 3/5 Skin:    General: Skin is warm and dry.     Comments: IV L antecubital fossa- out   Left heel not visualized today Neurological: Alert;  fair awareness of her deficits. Decreased sensation to light touch in LUE/LLE- facial sensation normal per pt Not using L side still unless forced- OT making her use this AM   Assessment/Plan: 1. 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  Physiatrist is providing close team supervision and 24 hour management of active medical problems listed below.  Physiatrist and rehab team continue to assess barriers to discharge/monitor patient progress toward functional and medical  goals  Care Tool:  Bathing    Body parts bathed by patient: Chest, Abdomen, Face, Right upper leg, Left upper leg, Left arm, Right lower leg, Front perineal area, Left lower leg, Right arm, Buttocks   Body parts bathed by helper: Right arm     Bathing assist Assist Level: Minimal Assistance - Patient > 75%     Upper Body Dressing/Undressing Upper body dressing   What is the patient wearing?: Bra, Pull over shirt    Upper body assist Assist Level: Minimal Assistance - Patient > 75%    Lower Body Dressing/Undressing Lower body dressing      What is the patient wearing?: Pants, Underwear/pull up     Lower body assist Assist for lower body dressing: Minimal Assistance - Patient > 75%     Toileting Toileting Toileting Activity did not occur (Clothing management and hygiene only): N/A (no void or bm)  Toileting assist Assist for toileting: Minimal Assistance - Patient > 75%     Transfers Chair/bed transfer  Transfers assist  Chair/bed transfer activity did not occur: Refused  Chair/bed transfer assist level: Moderate Assistance - Patient 50 - 74%     Locomotion Ambulation   Ambulation assist   Ambulation activity did not occur: Safety/medical concerns  Assist level: 2 helpers Assistive device: Walker-rolling Max distance: 15'   Walk 10 feet activity   Assist  Walk  10 feet activity did not occur: Safety/medical concerns  Assist level: 2 helpers Assistive device: Walker-rolling   Walk 50 feet activity   Assist Walk 50 feet with 2 turns activity did not occur: Safety/medical concerns         Walk 150 feet activity   Assist Walk 150 feet activity did not occur: Safety/medical concerns         Walk 10 feet on uneven surface  activity   Assist Walk 10 feet on uneven surfaces activity did not occur: Safety/medical concerns         Wheelchair     Assist Will patient use wheelchair at discharge?: Yes Type of Wheelchair:  Manual Wheelchair activity did not occur: Safety/medical concerns  Wheelchair assist level: Supervision/Verbal cueing Max wheelchair distance: 100'    Wheelchair 50 feet with 2 turns activity    Assist    Wheelchair 50 feet with 2 turns activity did not occur: Safety/medical concerns   Assist Level: Supervision/Verbal cueing   Wheelchair 150 feet activity     Assist  Wheelchair 150 feet activity did not occur: Safety/medical concerns   Assist Level: Minimal Assistance - Patient > 75%   Blood pressure 138/64, pulse 69, temperature 97.8 F (36.6 C), resp. rate 16, height 5\' 5"  (1.651 m), weight 66.6 kg, SpO2 (!) 87 %.  Medical Problem List and Plan: 1.  Left-sided weakness/almost hemiplegia secondary to right basal ganglia and corona radiata infarct             -patient may  shower             -ELOS/Goals: 14-18 days- goals CGA- mod I   9/27- making strength exam gains every few days  10/9- reading paper   -Continue CIR 2.  Antithrombotics: -DVT/anticoagulation: SCDs             -antiplatelet therapy: Aspirin 81 mg daily Plavix 85 mg daily and BMS investigational med 3. Pain Management:   10/13- con't tylenol prn at home and voltaren gel 4. Mood: Provide emotional support             -antipsychotic agents: N/A 5. Neuropsych: This patient is capable of making decisions on her own behalf. 6. Skin/Wound Care: Routine skin checks 7. Fluids/Electrolytes/Nutrition: Routine in and outs with follow-up chemistries  9/22- labs look good on admission and were great on repeat on 9/27 8.  Hyperlipidemia.  Lipitor 80mg  daily 9.  Hypothyroidism.  Synthroid 10.  Asthma.  Continue nhaler as needed 11.  Overactive bladder.  Ditropan  10/12- per pt, working great and doesn't want to go back to "old medicine".  12.  Constipation.  MiraLAX twice daily as well as Senokot 2 tablets twice daily.  9/26 had results with sorbitol, meds. Stools now loose   -hold miralax for now  10/13-  LBM yesterday- con't regimen 13.Glaucoma.pt says she doesn't take drops and doesn't have this dx.   9/22- will check with family.  14. L foot drop- will order PRAFO.  9/22- has been ordered- not here yet.  9/23- didn't see in room- will check into    9/30: in room, but patient states she has not been wearing. Will place nursing order. 15. HTN-  10/2: Uncontrolled. Kidney function is normal Add Lisinopril 2.5mg .   10/3: Uncontrolled. Increase Lisinopril to 5mg .   10/4-BP better controlled- con't meds  10/5- BP 160s/70s this AM- had been doing better- will monitor before changing meds  10/6- BP 151/71 this AM  10/7- BP  140s/70s- con't regimen  10/8- BP 125/70- doing better  10/9- BP 144/64- increase lisinopril 10 mg daily.  10/10- BP 149/64- but last 2 BP's 120s/130s systolic- won't make changes as of yet   10/11: reviewed BP with daughter and recommended high potassium whole food diet at home, checking BP daily, and bringing log to f/u appointment  10/12- BP has been running higher last few days- will increase Lisinopril to 20 mg daily.  10/13- Con't Lisinopril 20 mg since BP is now controlled- 138/64- much better 16. Poor appetite  9/23- will check CMP in AM- to see total protein and albumin- and check a prealbumin- I'M concerned she's eating <25% of meals (so far only seen breakfast).   9/24- pt's prealbumin is 30 (18-38), Albumin is good at 3.9 and total protein good at 8.5- con't regimen 17. Hypokalemia  9/27- K+ 3.9  10/4- K+ 3.5- low normal-   10/6- K+ recheck Thursday  10/9- K+ 3.6  10/11: discussed trends with patient and daughter  35. Impaired cognition: continue SLP 19. Left sided inattention: continue therapy, education.  10/7- think about amantadine  10/8- will d/w OT/PT.    10/12- they aren't sure would be helpful     LOS: 22 days A FACE TO FACE EVALUATION WAS PERFORMED  Nicole [name] 08/17/2020, 8:23 AM

## 2020-08-17 NOTE — Progress Notes (Signed)
Patient discharged to home, accompanied by her daughter. 

## 2020-08-17 NOTE — Progress Notes (Signed)
Inpatient Rehabilitation Care Coordinator  Discharge Note  The overall goal for the admission was met for:   Discharge location: Yes. D/c to home with her dtr [name].   Length of Stay: Yes. 21 days.    Discharge activity level: Yes. Mod A.  Home/community participation: Yes. Limited.   Services provided included: MD, RD, PT, OT, SLP, RN, CM, TR, Pharmacy, Neuropsych and SW  Financial Services: Private Insurance: [employer]  Follow-up services arranged: Home Health: [facility] for PT/OT/SLP/aide and DME: [employer] for w/c, RW and 3in1 BSC  Comments (or additional information): contact pt dtr [name] #[phone]  Patient/Family verbalized understanding of follow-up arrangements: Yes  Individual responsible for coordination of the follow-up plan: Pt to have assistance with coordinating care needs.   Confirmed correct DME delivered: Auria A Chamberlain 08/17/2020    Auria A Chamberlain 

## 2020-08-17 NOTE — Progress Notes (Signed)
Patient ID: Nicole [name], female   DOB: [birth year], 86 y.o.   MRN: [id number]  SW spoke with Nicole [name]/RN CM with [employer] [phone]) to discuss mom's meals for pt. Reports pt will be enrolled into program. Pt will receive services for 2 weeks ( 2 meals per day); if she would like to extend, pt will need to private pay for services. SW met with pt in room to inform on above. SW called pt dtr Nicole [name] [phone]) to provide above updates.    Loralee Pacas, MSW, [place] Office: [phone] Cell: [phone] Fax: [phone]

## 2020-08-19 ENCOUNTER — Telehealth: Payer: Self-pay | Admitting: Registered Nurse

## 2020-08-19 ENCOUNTER — Telehealth: Payer: Self-pay | Admitting: *Deleted

## 2020-08-19 DIAGNOSIS — Z7982 Long term (current) use of aspirin: Secondary | ICD-10-CM | POA: Diagnosis not present

## 2020-08-19 DIAGNOSIS — I69328 Other speech and language deficits following cerebral infarction: Secondary | ICD-10-CM | POA: Diagnosis not present

## 2020-08-19 DIAGNOSIS — E039 Hypothyroidism, unspecified: Secondary | ICD-10-CM | POA: Diagnosis not present

## 2020-08-19 DIAGNOSIS — I080 Rheumatic disorders of both mitral and aortic valves: Secondary | ICD-10-CM | POA: Diagnosis not present

## 2020-08-19 DIAGNOSIS — I69392 Facial weakness following cerebral infarction: Secondary | ICD-10-CM | POA: Diagnosis not present

## 2020-08-19 DIAGNOSIS — I6381 Other cerebral infarction due to occlusion or stenosis of small artery: Secondary | ICD-10-CM | POA: Diagnosis not present

## 2020-08-19 DIAGNOSIS — I69354 Hemiplegia and hemiparesis following cerebral infarction affecting left non-dominant side: Secondary | ICD-10-CM | POA: Diagnosis not present

## 2020-08-19 DIAGNOSIS — I700 Atherosclerosis of aorta: Secondary | ICD-10-CM | POA: Diagnosis not present

## 2020-08-19 DIAGNOSIS — Z993 Dependence on wheelchair: Secondary | ICD-10-CM | POA: Diagnosis not present

## 2020-08-19 DIAGNOSIS — I10 Essential (primary) hypertension: Secondary | ICD-10-CM | POA: Diagnosis not present

## 2020-08-19 DIAGNOSIS — H409 Unspecified glaucoma: Secondary | ICD-10-CM | POA: Diagnosis not present

## 2020-08-19 DIAGNOSIS — E785 Hyperlipidemia, unspecified: Secondary | ICD-10-CM | POA: Diagnosis not present

## 2020-08-19 DIAGNOSIS — G8194 Hemiplegia, unspecified affecting left nondominant side: Secondary | ICD-10-CM | POA: Diagnosis not present

## 2020-08-19 DIAGNOSIS — N3281 Overactive bladder: Secondary | ICD-10-CM | POA: Diagnosis not present

## 2020-08-19 DIAGNOSIS — M4802 Spinal stenosis, cervical region: Secondary | ICD-10-CM | POA: Diagnosis not present

## 2020-08-19 DIAGNOSIS — R32 Unspecified urinary incontinence: Secondary | ICD-10-CM | POA: Diagnosis not present

## 2020-08-19 DIAGNOSIS — J45909 Unspecified asthma, uncomplicated: Secondary | ICD-10-CM | POA: Diagnosis not present

## 2020-08-19 DIAGNOSIS — M25562 Pain in left knee: Secondary | ICD-10-CM | POA: Diagnosis not present

## 2020-08-19 DIAGNOSIS — K5900 Constipation, unspecified: Secondary | ICD-10-CM | POA: Diagnosis not present

## 2020-08-19 NOTE — Telephone Encounter (Signed)
[name] called to request approval for her POC 3wk1,2wk3, 1wk4 and a [employer] for 2xwk.  Approval given.  She also had a question about the study medication. I verified with [name] that she was to continue taking the study medication.

## 2020-08-19 NOTE — Telephone Encounter (Signed)
Transitional Care call Transitional Questions Answered by Daughter Ms. [name]  Patient name: Nicole [name] DOB: 22-Dec-1929 1. Are you/is patient experiencing any problems since coming home? No a. Are there any questions regarding any aspect of care? No 2. Are there any questions regarding medications administration/dosing? Yes, she asked why she's not on a baby aspirin. Discharge summary was reviewed. Left message for Dulce Sellar will speak with Dr. Berline Chough. Will call Ms. [name] when I have answer she verbalizes understanding. a. Are meds being taken as prescribed? Yes b. "Patient should review meds with caller to confirm" Medication List Reviewed. 3. Have there been any falls? No 4. Has Home Health been to the house and/or have they contacted you? Yes, [facility] a. If not, have you tried to contact them? NA b. Can we help you contact them? NA 5. Are bowels and bladder emptying properly? Yes a. Are there any unexpected incontinence issues? No b. If applicable, is patient following bowel/bladder programs? No 6. Any fevers, problems with breathing, unexpected pain? No 7. Are there any skin problems or new areas of breakdown? No 8. Has the patient/family member arranged specialty MD follow up (ie cardiology/neurology/renal/surgical/etc.)?  Ms. [name] was instructed to call [facility] and to call  her PCP to schedule HFU, she verbalizes understanding. a. Can we help arrange? No. 9. Does the patient need any other services or support that we can help arrange? No. 10. Are caregivers following through as expected in assisting the patient? Yes 11. Has the patient quit smoking, drinking alcohol, or using drugs as recommended? (                        )  Appointment date/time 08/26/2020  arrival time 9:00 for 9:20 appointment with Jones Bales ANP. At  [address] [facility] [address]

## 2020-08-19 NOTE — Telephone Encounter (Signed)
TC Call placed to daughter [name], no answer. Left message to return the call.

## 2020-08-19 NOTE — Telephone Encounter (Signed)
This provider spoke with Dr Berline Chough regarding Nicole [name] question regarding the baby aspirin. [name] is only to take the medication as prescribed, no baby aspirin and to continue the BMS experimental Drug as Per Neurology, she verbalizes understanding.

## 2020-08-22 ENCOUNTER — Emergency Department (HOSPITAL_COMMUNITY)
Admission: EM | Admit: 2020-08-22 | Discharge: 2020-08-22 | Disposition: A | Discharge status: Home / Self Care | Payer: Medicare Other | Attending: Emergency Medicine | Admitting: Emergency Medicine

## 2020-08-22 ENCOUNTER — Encounter (HOSPITAL_BASED_OUTPATIENT_CLINIC_OR_DEPARTMENT_OTHER): Payer: Self-pay | Admitting: Emergency Medicine

## 2020-08-22 ENCOUNTER — Other Ambulatory Visit: Payer: Self-pay

## 2020-08-22 ENCOUNTER — Emergency Department (HOSPITAL_BASED_OUTPATIENT_CLINIC_OR_DEPARTMENT_OTHER)
Admission: EM | Admit: 2020-08-22 | Discharge: 2020-08-22 | Disposition: A | Discharge status: Home / Self Care | Payer: Medicare Other | Source: Home / Self Care | Attending: Emergency Medicine | Admitting: Emergency Medicine

## 2020-08-22 ENCOUNTER — Encounter (HOSPITAL_COMMUNITY): Payer: Self-pay

## 2020-08-22 ENCOUNTER — Emergency Department (HOSPITAL_BASED_OUTPATIENT_CLINIC_OR_DEPARTMENT_OTHER): Payer: Medicare Other

## 2020-08-22 DIAGNOSIS — E039 Hypothyroidism, unspecified: Secondary | ICD-10-CM | POA: Insufficient documentation

## 2020-08-22 DIAGNOSIS — I10 Essential (primary) hypertension: Secondary | ICD-10-CM | POA: Insufficient documentation

## 2020-08-22 DIAGNOSIS — Z79899 Other long term (current) drug therapy: Secondary | ICD-10-CM | POA: Insufficient documentation

## 2020-08-22 DIAGNOSIS — M79604 Pain in right leg: Secondary | ICD-10-CM | POA: Insufficient documentation

## 2020-08-22 DIAGNOSIS — R5381 Other malaise: Secondary | ICD-10-CM | POA: Diagnosis not present

## 2020-08-22 DIAGNOSIS — Z5321 Procedure and treatment not carried out due to patient leaving prior to being seen by health care provider: Secondary | ICD-10-CM | POA: Insufficient documentation

## 2020-08-22 DIAGNOSIS — Z79890 Hormone replacement therapy (postmenopausal): Secondary | ICD-10-CM | POA: Insufficient documentation

## 2020-08-22 DIAGNOSIS — R52 Pain, unspecified: Secondary | ICD-10-CM | POA: Diagnosis not present

## 2020-08-22 DIAGNOSIS — M79605 Pain in left leg: Secondary | ICD-10-CM | POA: Insufficient documentation

## 2020-08-22 DIAGNOSIS — M7989 Other specified soft tissue disorders: Secondary | ICD-10-CM | POA: Diagnosis not present

## 2020-08-22 HISTORY — DX: Cerebral infarction, unspecified: I63.9

## 2020-08-22 HISTORY — DX: Disorder of thyroid, unspecified: E07.9

## 2020-08-22 HISTORY — DX: Essential (primary) hypertension: I10

## 2020-08-22 IMAGING — US US EXTREM LOW VENOUS*L*
1 series · 14 of 24 positions shown · non-contrast
Comparison: None.

CLINICAL DATA: Left leg pain and swelling.

EXAM:
Left LOWER EXTREMITY VENOUS DOPPLER ULTRASOUND
TECHNIQUE: Gray-scale sonography with compression, as well as color and duplex
ultrasound, were performed to evaluate the deep venous system(s)
from the level of the common femoral vein through the popliteal and
proximal calf veins.

[Series 1: us extrem low venous*left* · 14 of 29 slices shown]
[im 1/29]
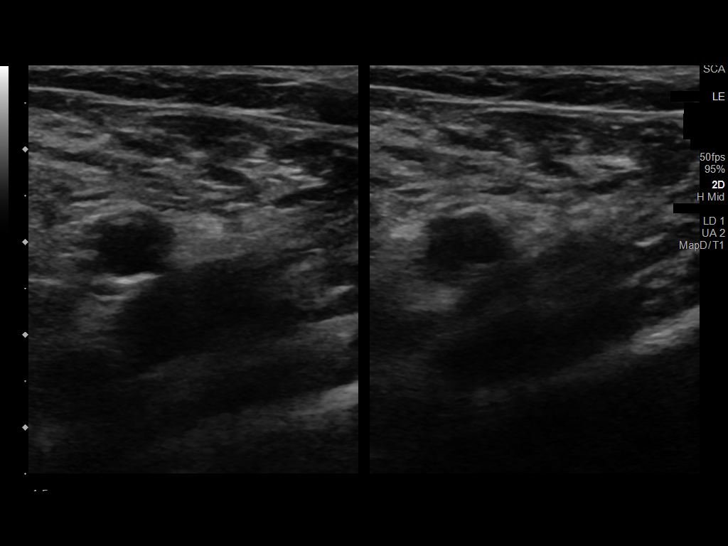
[im 3/29]
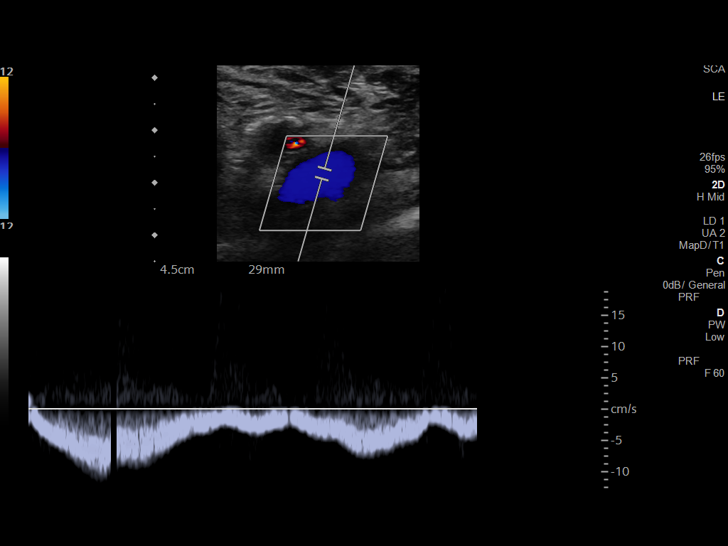
[im 5/29]
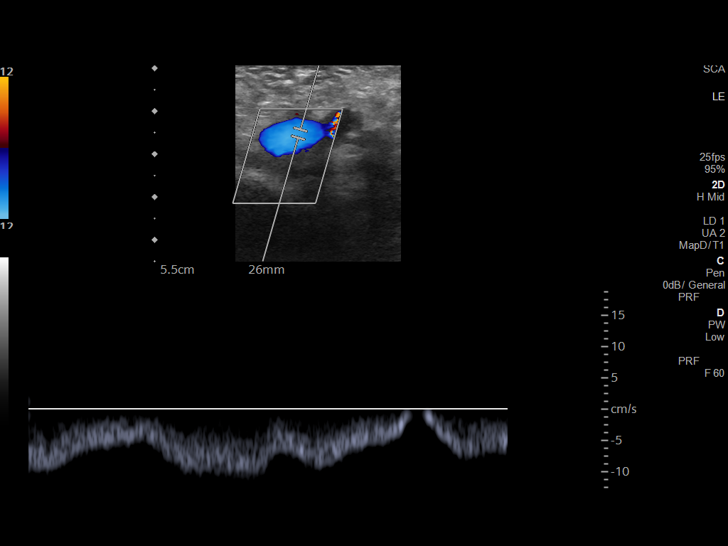
[im 8/29]
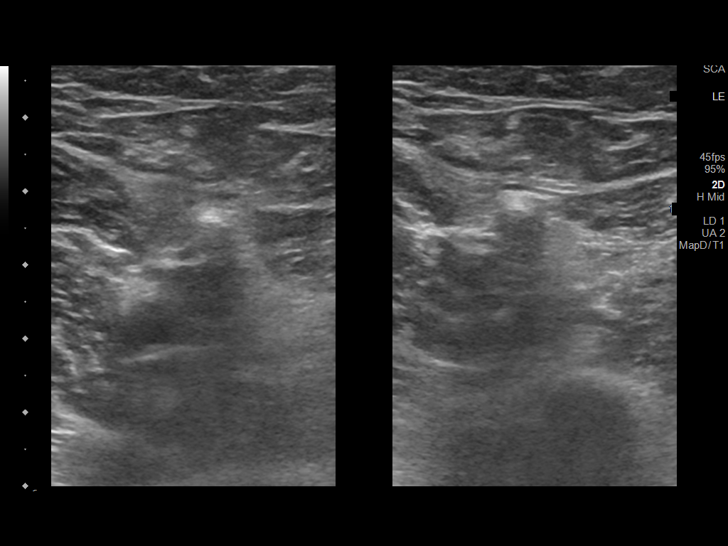
[im 9/29]
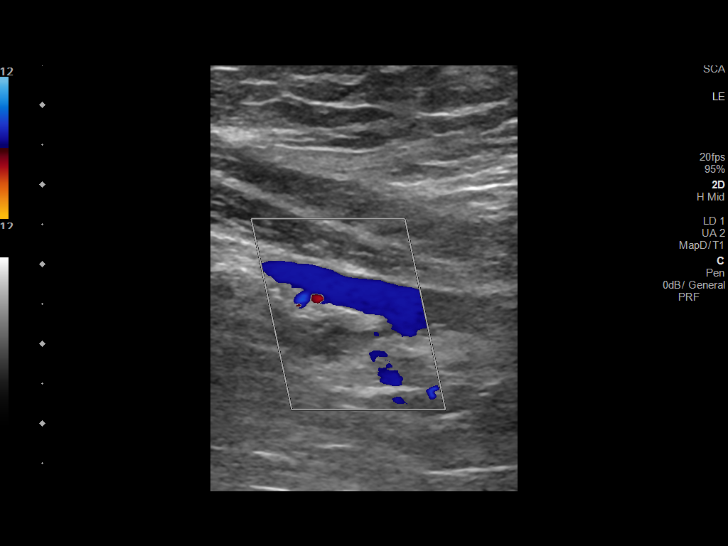
[im 11/29]
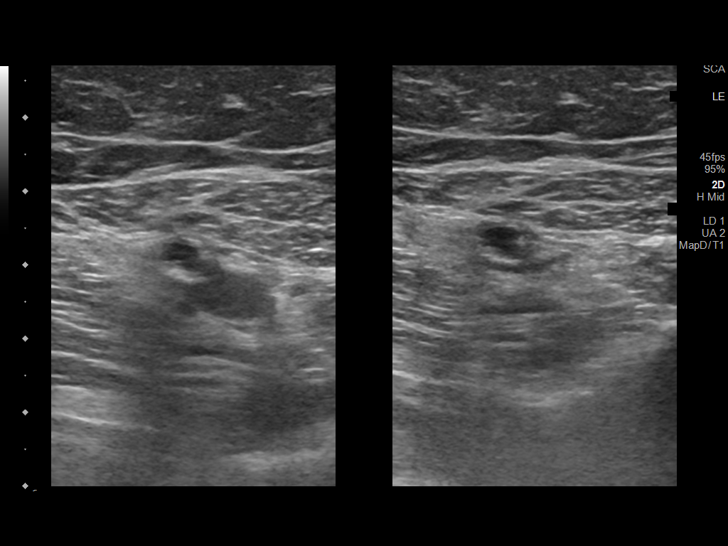
[im 14/29]
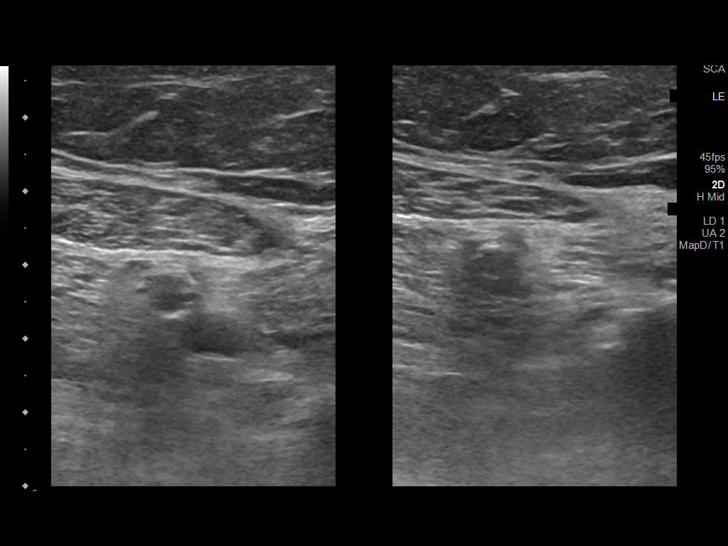
[im 15/29]
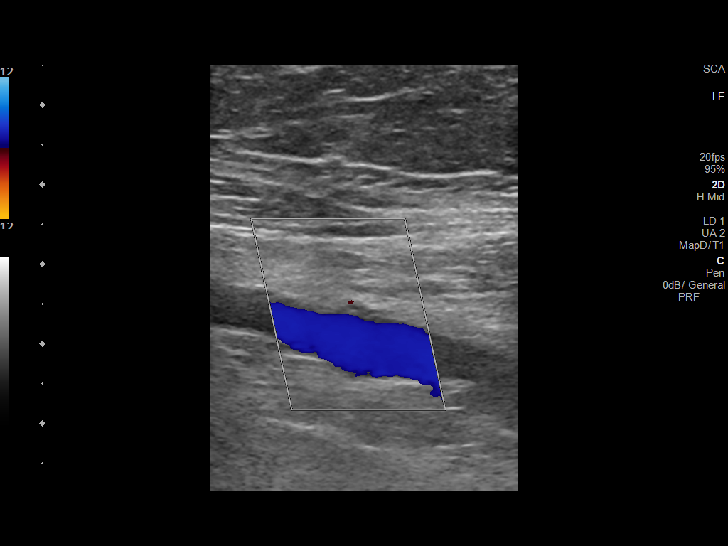
[im 18/29]
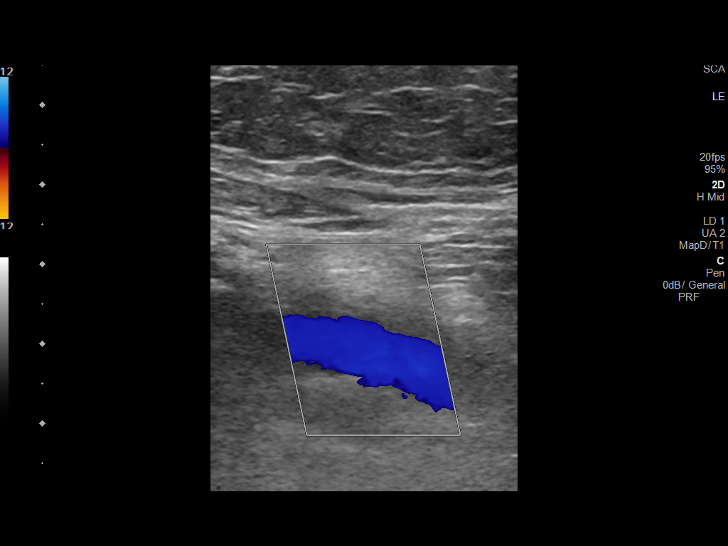
[im 20/29]
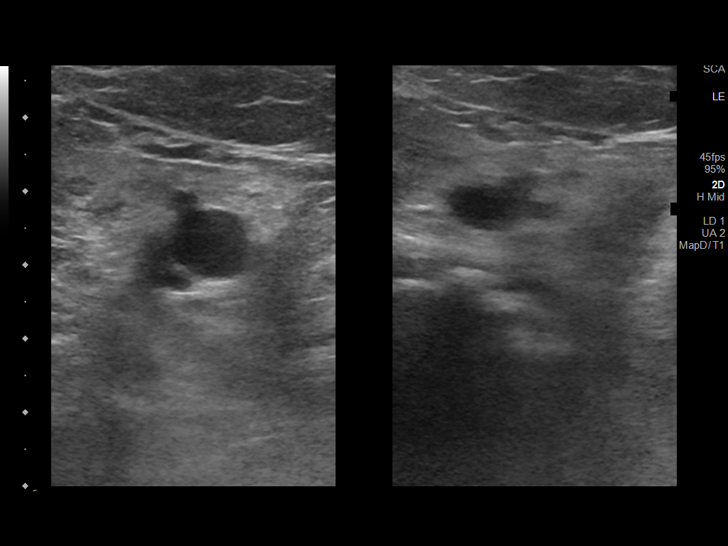
[im 22/29]
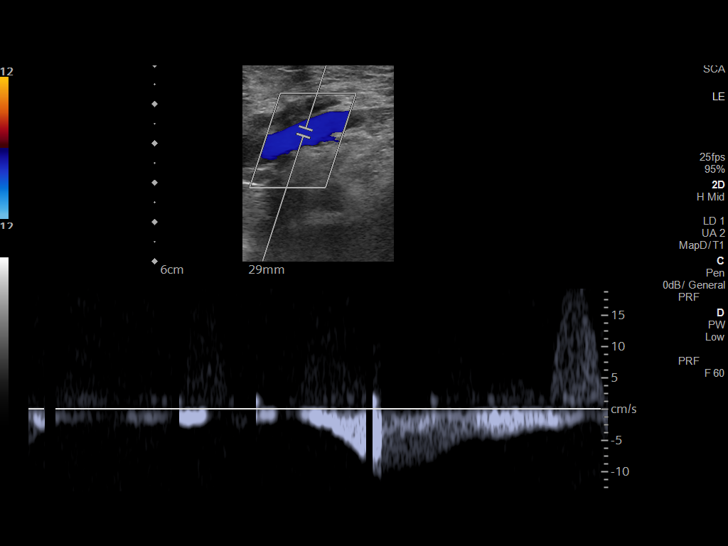
[im 24/29]
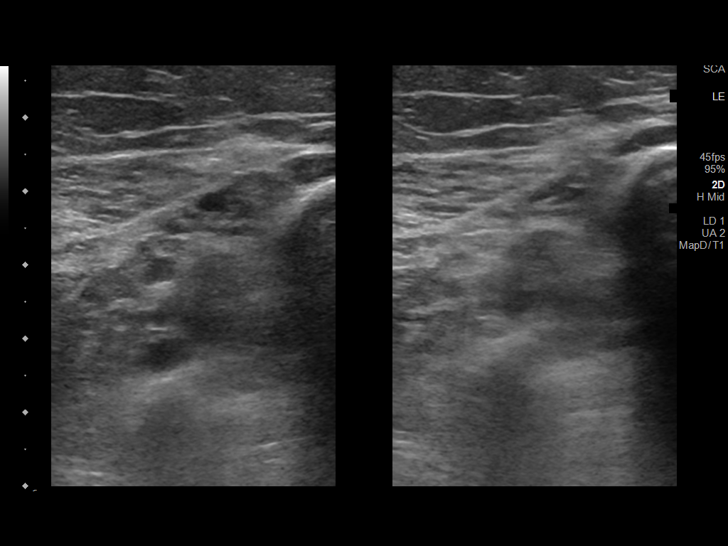
[im 26/29]
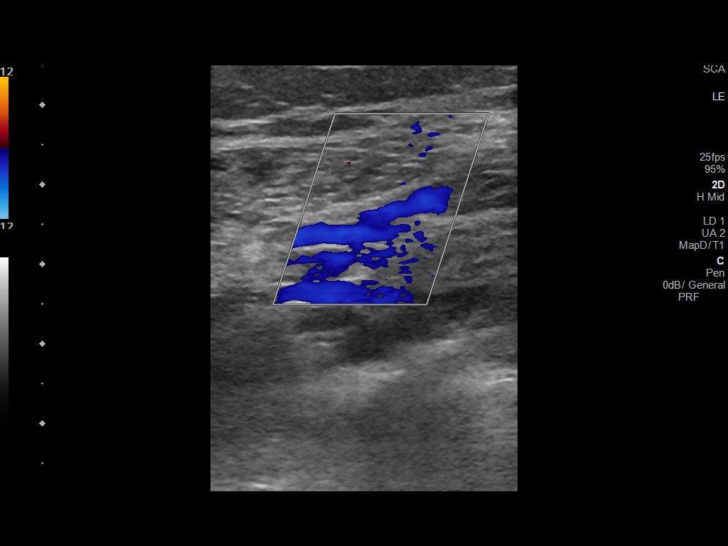
[im 29/29]
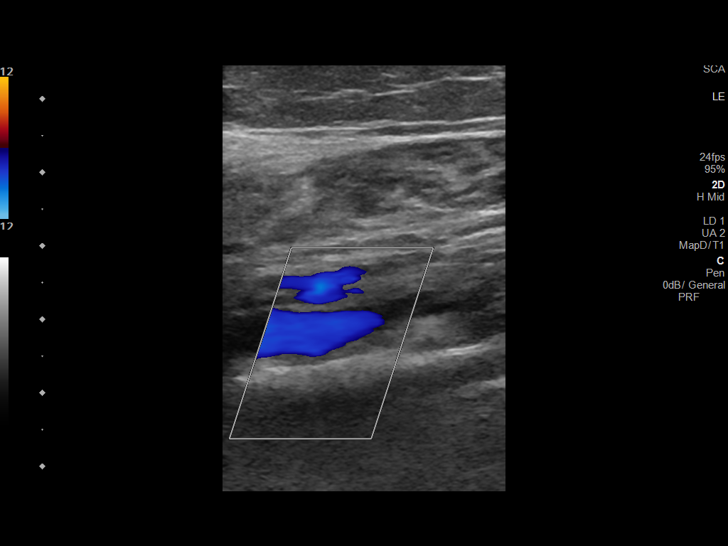

[14 of 24 positions shown; findings below may reference images not displayed]

FINDINGS: VENOUS

Normal compressibility of the common femoral, superficial femoral,
and popliteal veins, as well as the visualized calf veins.
Visualized portions of profunda femoral vein and great saphenous
vein unremarkable. No filling defects to suggest DVT on grayscale or
color Doppler imaging. Doppler waveforms show normal direction of
venous flow, normal respiratory plasticity and response to
augmentation.

Limited views of the contralateral common femoral vein are
unremarkable.

OTHER

None.

Limitations: none
IMPRESSION: Negative.

## 2020-08-22 NOTE — ED Notes (Signed)
C/o left leg leg pain  Started back of leg behind knee,  Leg swollen  Hx of left sided stroke

## 2020-08-22 NOTE — ED Triage Notes (Signed)
Pt from home with PTAR for right leg pain from the hip down to her foot. No swelling or redness noted, pt denies injury or trauma. No hx of DVTs. + pulses.

## 2020-08-22 NOTE — ED Provider Notes (Signed)
[facility] EMERGENCY DEPARTMENT Provider Note   CSN: [id number] Arrival date & time: 08/22/20  1009     History Chief Complaint  Patient presents with  . Leg Pain    Nicole [name] is a 86 y.o. female.  HPI Patient presents with pain and swelling in her left lower extremity. Reportedly pain behind the left knee. Around a month ago had a severe stroke and is chronically weak on the left side now. However was having pain in that knee prior to the stroke. Had had injections previously by Dr. Eulah Pont. Now pain more severe. No trauma. No fall. No chest pain or trouble breathing. No change in color.    Past Medical History:  Diagnosis Date  . Hypertension   . Stroke [facility])    September 2021  . Thyroid disease     Patient Active Problem List   Diagnosis Date Noted  . Left knee pain 07/26/2020  . Cerebrovascular accident (CVA) of right basal ganglia (HCC) 07/26/2020  . Left hemiplegia (HCC) 07/25/2020  . Hypokalemia 07/20/2020  . CVA (cerebral vascular accident) (HCC) 07/20/2020  . Essential hypertension 07/20/2020  . Hypothyroidism 07/20/2020    History reviewed. No pertinent surgical history.   OB History   No obstetric history on file.     Family History  Problem Relation Age of Onset  . Diabetes Mother   . Diabetes Other   . Hypertension Other     Social History   Tobacco Use  . Smoking status: Never Smoker  . Smokeless tobacco: Never Used  Vaping Use  . Vaping Use: Never used  Substance Use Topics  . Alcohol use: No    Alcohol/week: 0.0 standard drinks  . Drug use: No    Home Medications Prior to Admission medications   Medication Sig Start Date End Date Taking? Authorizing Provider  acetaminophen (TYLENOL) 325 MG tablet Take 2 tablets (650 mg total) by mouth every 8 (eight) hours. 08/16/20   Angiulli, Mcarthur Rossetti, PA-C  atorvastatin (LIPITOR) 80 MG tablet Take 1 tablet (80 mg total) by mouth daily. 08/16/20   Angiulli, Mcarthur Rossetti, PA-C    cetirizine (ZYRTEC) 10 MG tablet Take 1 tablet (10 mg total) by mouth daily as needed for allergies or rhinitis. 07/26/20   Laverna Peace, MD  cholecalciferol (VITAMIN D) 25 MCG tablet Take 1 tablet (1,000 Units total) by mouth daily. 08/16/20   Angiulli, Mcarthur Rossetti, PA-C  Cholecalciferol (VITAMIN D-3 PO) Take 1 capsule by mouth daily.    [provider]  diclofenac Sodium (VOLTAREN) 1 % GEL Apply 2 g topically 4 (four) times daily as needed (to affected areas- for pain). 08/16/20   Angiulli, Mcarthur Rossetti, PA-C  levothyroxine (SYNTHROID) 50 MCG tablet Take 1 tablet (50 mcg total) by mouth daily before breakfast. 08/16/20   Angiulli, Mcarthur Rossetti, PA-C  lisinopril (ZESTRIL) 20 MG tablet Take 1 tablet (20 mg total) by mouth daily. 08/17/20   Angiulli, Mcarthur Rossetti, PA-C  oxybutynin (DITROPAN-XL) 5 MG 24 hr tablet Take 1 tablet (5 mg total) by mouth at bedtime. 08/16/20   Angiulli, Mcarthur Rossetti, PA-C  polyethylene glycol (MIRALAX / GLYCOLAX) 17 g packet Take 17 g by mouth daily as needed. 07/26/20   Laverna Peace, MD  PROAIR HFA [phone] Base) MCG/ACT inhaler Inhale 1 puff into the lungs every 4 (four) hours as needed for wheezing or shortness of breath. 08/16/20   Angiulli, Mcarthur Rossetti, PA-C  senna-docusate (SENOKOT-S) 8.6-50 MG tablet Take 2 tablets  by mouth 2 (two) times daily. 07/26/20   Laverna Peace, MD  vitamin B-12 (CYANOCOBALAMIN) 250 MCG tablet Take 1 tablet (250 mcg total) by mouth daily. 08/16/20   Angiulli, Mcarthur Rossetti, PA-C    Allergies    Patient has no known allergies.  Review of Systems   Review of Systems  Constitutional: Negative for appetite change, chills and fever.  Respiratory: Negative for shortness of breath.   Cardiovascular: Negative for chest pain.  Gastrointestinal: Negative for abdominal pain.  Genitourinary: Negative for flank pain.  Musculoskeletal: Negative for back pain.       Left knee and lower extremity pain and swelling.  Skin: Negative for rash and wound.   Neurological: Positive for weakness.  Psychiatric/Behavioral: Negative for confusion.    Physical Exam Updated Vital Signs BP (!) 143/68   Pulse 73   Temp 98.8 F (37.1 C) (Oral)   Resp 18   Ht 5\' 5"  (1.651 m)   Wt 72.6 kg   SpO2 98%   BMI 26.63 kg/m   Physical Exam Vitals and nursing note reviewed.  HENT:     Head: Normocephalic.  Cardiovascular:     Rate and Rhythm: Regular rhythm.  Pulmonary:     Effort: No respiratory distress.     Breath sounds: No rhonchi.  Abdominal:     Tenderness: There is no abdominal tenderness.  Musculoskeletal:        General: Swelling present.     Comments: Some pitting edema left lower extremity. Pain to palpation behind right knee. No knee effusion. No tenderness over hip. Hemiparesis on left compared to right on upper and lower extremities.  Skin:    Capillary Refill: Capillary refill takes less than 2 seconds.  Neurological:     Mental Status: She is alert.     Comments: Awake and answers questions. Chronic left-sided weakness.     ED Results / Procedures / Treatments   Labs (all labs ordered are listed, but only abnormal results are displayed) Labs Reviewed - No data to display  EKG None  Radiology Venous Img Lower Unilateral Left  Result Date: 08/22/2020 CLINICAL DATA:  Left leg pain and swelling. EXAM: Left LOWER EXTREMITY VENOUS DOPPLER ULTRASOUND TECHNIQUE: Gray-scale sonography with compression, as well as color and duplex ultrasound, were performed to evaluate the deep venous system(s) from the level of the common femoral vein through the popliteal and proximal calf veins. COMPARISON:  None. FINDINGS: VENOUS Normal compressibility of the common femoral, superficial femoral, and popliteal veins, as well as the visualized calf veins. Visualized portions of profunda femoral vein and great saphenous vein unremarkable. No filling defects to suggest DVT on grayscale or color Doppler imaging. Doppler waveforms show normal  direction of venous flow, normal respiratory plasticity and response to augmentation. Limited views of the contralateral common femoral vein are unremarkable. OTHER None. Limitations: none IMPRESSION: Negative. Electronically Signed   By: 08/24/2020 M.D.   On: 08/22/2020 11:30    Procedures Procedures (including critical care time)  Medications Ordered in ED Medications - No data to display  ED Course  I have reviewed the triage vital signs and the nursing notes.  Pertinent labs & imaging results that were available during my care of the patient were reviewed by me and considered in my medical decision making (see chart for details).    MDM Rules/Calculators/A&P  Patient with acute on chronic left leg pain with some swelling.  No recent fall.  Has had previous imaging showing a bad knee.  With swelling ultrasound done did not show clot.  Does not appear to be infection.  Has seen Dr. Eulah Pont in the past will have follow-up as an outpatient.  Doubt traumatic injury. Reviewed results of ultrasound.  Discussed with patient and her daughter.  Final Clinical Impression(s) / ED Diagnoses Final diagnoses:  Pain of left lower extremity    Rx / DC Orders ED Discharge Orders    None       Benjiman Core, MD 08/22/20 1157

## 2020-08-22 NOTE — ED Triage Notes (Signed)
L leg pain behind the knee with foot swelling since last night. Recently discharged for a stroke.

## 2020-08-22 NOTE — Discharge Instructions (Addendum)
There is mild swelling of your lower leg.  There is no clot on the ultrasound.  This may be worsening of the chronic pain in the knee and leg.  Follow-up with Dr. Eulah Pont for further evaluation and treatment of this.

## 2020-08-26 ENCOUNTER — Telehealth: Payer: Self-pay | Admitting: *Deleted

## 2020-08-26 ENCOUNTER — Other Ambulatory Visit: Payer: Self-pay

## 2020-08-26 ENCOUNTER — Encounter: Payer: Medicare Other | Attending: Registered Nurse | Admitting: Registered Nurse

## 2020-08-26 VITALS — BP 133/82 | HR 78 | Temp 97.9°F

## 2020-08-26 DIAGNOSIS — I6381 Other cerebral infarction due to occlusion or stenosis of small artery: Secondary | ICD-10-CM

## 2020-08-26 DIAGNOSIS — G8194 Hemiplegia, unspecified affecting left nondominant side: Secondary | ICD-10-CM | POA: Diagnosis not present

## 2020-08-26 DIAGNOSIS — I10 Essential (primary) hypertension: Secondary | ICD-10-CM | POA: Insufficient documentation

## 2020-08-26 NOTE — Progress Notes (Signed)
Subjective:    Patient ID: Nicole [name], female    DOB: 1930-10-28, 86 y.o.   MRN: [id number]  HPI: Nicole [name] is a 86 y.o. female who is here for Transitional Care visit for follow up of her Cerebrovascular accident of right basal ganglia, Left Hemiplegia and essential hypertension. Nicole [name] was sent emergently to  [facility] ED on 07/20/2020, code stroke was activated.  Neurology was consulted.   CT Head Code Stroke WO Contrast: CT ANGIO Head Code Stroke IMPRESSION: 1. No emergent large vessel occlusion or high-grade stenosis of the head or neck.  Aortic Atherosclerosis (ICD10-I70.0).  MR Brain WO Contrast:  IMPRESSION: 1. Positive for acute small vessel infarct in the posterior right corona radiata and lentiform. No associated hemorrhage or mass effect.  2. Underlying chronic small vessel disease, including in the bilateral basal ganglia.  3. Mild degenerative spinal stenosis suspected at C4-C5.  Nicole [name] was admitted to to inpatient rehabilitation on 07/26/2020 and discharged home on 08/17/2020. She is receiving Home Health Therapy from [facility].  She denies any pain. She rates her pain 0. Daughter states her appetite is fair. Nicole [name] arrived to office in a wheelchair.   Daughter in room, all questions answered.   Pain Inventory Average Pain 8 Pain Right Now 0 My pain is intermittent, aching and throbbing  LOCATION OF PAIN    BOWEL Number of stools per week:  Oral laxative use No  Type of laxative Enema or suppository use No  History of colostomy No  Incontinent No   BLADDER Normal In and out cath, frequency  Able to self cath No  Bladder incontinence No  Frequent urination Yes  Leakage with coughing No  Difficulty starting stream No  Incomplete bladder emptying No    Mobility walk with assistance use a walker how many minutes can you walk? 1 ability to climb steps?  no do you drive?  no use a wheelchair needs help with  transfers  Function retired I need assistance with the following:  dressing, bathing, toileting, meal prep, household duties and shopping  Neuro/Psych bladder control problems weakness numbness trouble walking loss of taste or smell  Prior Studies TC appt  Physicians involved in your care TC appt   Family History  Problem Relation Age of Onset  . Diabetes Mother   . Diabetes Other   . Hypertension Other    Social History   Socioeconomic History  . Marital status: Single    Spouse name: Not on file  . Number of children: Not on file  . Years of education: Not on file  . Highest education level: Not on file  Occupational History  . Not on file  Tobacco Use  . Smoking status: Never Smoker  . Smokeless tobacco: Never Used  Vaping Use  . Vaping Use: Never used  Substance and Sexual Activity  . Alcohol use: No    Alcohol/week: 0.0 standard drinks  . Drug use: No  . Sexual activity: Not on file  Other Topics Concern  . Not on file  Social History Narrative  . Not on file   Social Determinants of Health   Financial Resource Strain:   . Difficulty of Paying Living Expenses: Not on file  Food Insecurity:   . Worried About Programme researcher, broadcasting/film/video in the Last Year: Not on file  . Ran Out of Food in the Last Year: Not on file  Transportation Needs:   . Lack of Transportation (  Medical): Not on file  . Lack of Transportation (Non-Medical): Not on file  Physical Activity:   . Days of Exercise per Week: Not on file  . Minutes of Exercise per Session: Not on file  Stress:   . Feeling of Stress : Not on file  Social Connections:   . Frequency of Communication with Friends and Family: Not on file  . Frequency of Social Gatherings with Friends and Family: Not on file  . Attends Religious Services: Not on file  . Active Member of Clubs or Organizations: Not on file  . Attends Banker Meetings: Not on file  . Marital Status: Not on file   No past surgical  history on file. Past Medical History:  Diagnosis Date  . Hypertension   . Stroke [facility])    September 2021  . Thyroid disease    BP 133/82   Pulse 78   Temp 97.9 F (36.6 C)   SpO2 94%   Opioid Risk Score:   Fall Risk Score:  `1  Depression screen PHQ 2/9  No flowsheet data found.  Review of Systems  Musculoskeletal: Positive for gait problem.  Neurological: Positive for weakness and numbness.  All other systems reviewed and are negative.      Objective:   Physical Exam Vitals and nursing note reviewed.  Constitutional:      Appearance: Normal appearance.  Cardiovascular:     Rate and Rhythm: Normal rate and regular rhythm.     Pulses: Normal pulses.     Heart sounds: Normal heart sounds.  Pulmonary:     Effort: Pulmonary effort is normal.     Breath sounds: Normal breath sounds.  Musculoskeletal:     Cervical back: Normal range of motion and neck supple.     Comments: Normal Muscle Bulk and Muscle Testing Reveals:  Upper Extremities: Right: Full ROM and Muscle Strength 5/5 Left Upper Extremity: Decreased ROM 30 Degrees and Muscle Strength 3/5 Lower Extremities: Right: Full ROM and Muscle Strength 5/5 Left Lower Extremity: Decreased ROM and Muscle Strength 4/5 Arrived in wheelchair   Skin:    General: Skin is warm and dry.  Neurological:     Mental Status: She is alert and oriented to person, place, and time.  Psychiatric:        Mood and Affect: Mood normal.        Behavior: Behavior normal.           Assessment & Plan:  1.Cerebrovascular accident of right basal ganglia: Left Hemiplegia: Continue Home Health Therapy with [facility]. Continue current medication regimen.  2.Essential hypertension: Continue current medication regimen.   20 minutes of face to face patient care time was spent during this visit. All questions were encouraged and answered.  F/U with Dr Berline Chough in 4- 6 weeks

## 2020-08-26 NOTE — Telephone Encounter (Signed)
Joni Reining from [facility] called to request a visit for MSW to assist with community resources.  Approval given.

## 2020-08-29 ENCOUNTER — Encounter: Payer: Self-pay | Admitting: Registered Nurse

## 2020-08-30 ENCOUNTER — Telehealth: Payer: Self-pay

## 2020-08-30 DIAGNOSIS — I639 Cerebral infarction, unspecified: Secondary | ICD-10-CM | POA: Diagnosis not present

## 2020-08-30 DIAGNOSIS — E785 Hyperlipidemia, unspecified: Secondary | ICD-10-CM | POA: Diagnosis not present

## 2020-08-30 DIAGNOSIS — E039 Hypothyroidism, unspecified: Secondary | ICD-10-CM | POA: Diagnosis not present

## 2020-08-30 DIAGNOSIS — I10 Essential (primary) hypertension: Secondary | ICD-10-CM | POA: Diagnosis not present

## 2020-08-30 NOTE — Telephone Encounter (Signed)
I spoke with someone at [facility] regarding this patient. They were inquiring if the patient is able to take aspirin as they are on BMS investigational medication. I advised them that, per Ihor Austin, NP and the discharge notes, they should already be on an aspirin regimen, and that I would ask you to call her back for more details tomorrow. The best call back number is #[phone] and her name is Victorino Dike.

## 2020-08-31 ENCOUNTER — Telehealth: Payer: Self-pay | Admitting: Neurology

## 2020-08-31 NOTE — Telephone Encounter (Signed)
Kindly let patient know that stroke study medication she is getting includes blood thinners and she does not need to take any other blood thinner on her own

## 2020-08-31 NOTE — Telephone Encounter (Signed)
Called patient's daughter, stated her mother is in a clinical trial after patient's stroke, 09/15, seen by Dr. Pearlean Brownie in hospital and placed in clinical trial for stroke.  [place] a PA with Dr. Lanell Matar office, asked about patient being on a blood thinner and patient and/or daughter does not if she is on a blood thinner with the clinical trial. So, question they are wanting answered is; is patient on a blood thinner in the clinical trial or is it a placebo?  Should patient be on a blood thinner after her stroke so she doesn't have another stroke?  This message was forwarded to [facility] as well.  Daughter is aware you are out of the office til Monday, she said that was fine and was in no hurry.  Daughter expressed appreciation for the phone call.

## 2020-08-31 NOTE — Telephone Encounter (Signed)
Pt saw Dr. Pearlean Brownie while in the hospital. Daughter, Eleen Litz (on [place]) called, does she need to be on a blood thinner in addition to this medication for the study. Her doctor's office want to know, but would like for the nurse to call me.

## 2020-09-01 NOTE — Telephone Encounter (Signed)
Called daughter back and discussed Dr. Marlis Edelson information regarding the clinical trial her mom is in.  Roslyn verbalized understanding and expressed appreciation.

## 2020-09-19 DIAGNOSIS — I69392 Facial weakness following cerebral infarction: Secondary | ICD-10-CM | POA: Diagnosis not present

## 2020-09-19 DIAGNOSIS — I69328 Other speech and language deficits following cerebral infarction: Secondary | ICD-10-CM | POA: Diagnosis not present

## 2020-09-19 DIAGNOSIS — I69354 Hemiplegia and hemiparesis following cerebral infarction affecting left non-dominant side: Secondary | ICD-10-CM | POA: Diagnosis not present

## 2020-09-19 DIAGNOSIS — Z993 Dependence on wheelchair: Secondary | ICD-10-CM | POA: Diagnosis not present

## 2020-09-19 DIAGNOSIS — H409 Unspecified glaucoma: Secondary | ICD-10-CM | POA: Diagnosis not present

## 2020-09-19 DIAGNOSIS — R32 Unspecified urinary incontinence: Secondary | ICD-10-CM | POA: Diagnosis not present

## 2020-09-19 DIAGNOSIS — K5900 Constipation, unspecified: Secondary | ICD-10-CM | POA: Diagnosis not present

## 2020-09-19 DIAGNOSIS — E039 Hypothyroidism, unspecified: Secondary | ICD-10-CM | POA: Diagnosis not present

## 2020-09-19 DIAGNOSIS — J45909 Unspecified asthma, uncomplicated: Secondary | ICD-10-CM | POA: Diagnosis not present

## 2020-09-19 DIAGNOSIS — I080 Rheumatic disorders of both mitral and aortic valves: Secondary | ICD-10-CM | POA: Diagnosis not present

## 2020-09-19 DIAGNOSIS — I6381 Other cerebral infarction due to occlusion or stenosis of small artery: Secondary | ICD-10-CM | POA: Diagnosis not present

## 2020-09-19 DIAGNOSIS — M25562 Pain in left knee: Secondary | ICD-10-CM | POA: Diagnosis not present

## 2020-09-19 DIAGNOSIS — E785 Hyperlipidemia, unspecified: Secondary | ICD-10-CM | POA: Diagnosis not present

## 2020-09-19 DIAGNOSIS — M4802 Spinal stenosis, cervical region: Secondary | ICD-10-CM | POA: Diagnosis not present

## 2020-09-19 DIAGNOSIS — I10 Essential (primary) hypertension: Secondary | ICD-10-CM | POA: Diagnosis not present

## 2020-09-19 DIAGNOSIS — I700 Atherosclerosis of aorta: Secondary | ICD-10-CM | POA: Diagnosis not present

## 2020-09-19 DIAGNOSIS — G8194 Hemiplegia, unspecified affecting left nondominant side: Secondary | ICD-10-CM | POA: Diagnosis not present

## 2020-09-19 DIAGNOSIS — N3281 Overactive bladder: Secondary | ICD-10-CM | POA: Diagnosis not present

## 2020-09-19 DIAGNOSIS — Z7982 Long term (current) use of aspirin: Secondary | ICD-10-CM | POA: Diagnosis not present

## 2020-10-04 ENCOUNTER — Telehealth: Payer: Self-pay | Admitting: Neurology

## 2020-10-04 NOTE — Telephone Encounter (Signed)
Patient's daughter Bernisha Verma called [place] today on behalf of her mother. They're wondering if her therapy could be done at the neurorehab instead and if a referral could be put in?

## 2020-10-04 NOTE — Telephone Encounter (Signed)
Called Nicole [name] back and discussed Dr. Marlis Edelson recommendation.  Nicole [name] was agreeable and stated she would get the referral from the rehab NP.  She expressed appreciation for the information.

## 2020-10-04 NOTE — Telephone Encounter (Signed)
This patient has not yet been seen in our clinic but has seen the rehab nurse practitioner last month.  Advise   to call them to arrange outpatient referral to neuro rehab

## 2020-10-05 ENCOUNTER — Telehealth: Payer: Self-pay | Admitting: *Deleted

## 2020-10-05 DIAGNOSIS — I639 Cerebral infarction, unspecified: Secondary | ICD-10-CM

## 2020-10-05 NOTE — Telephone Encounter (Signed)
Patients daughter left a message stating patient referred to physical therapy on [address].  They would like referral switched to Neurorehab on [address]

## 2020-10-06 ENCOUNTER — Other Ambulatory Visit: Payer: Self-pay | Admitting: Neurology

## 2020-10-06 DIAGNOSIS — I6300 Cerebral infarction due to thrombosis of unspecified precerebral artery: Secondary | ICD-10-CM

## 2020-10-06 NOTE — Telephone Encounter (Signed)
Pt is on my schedule tomorrow- I will reorder it then, just to verify exactly what therapy pt needs- thank you

## 2020-10-06 NOTE — Telephone Encounter (Signed)
Enrique Sack, PT called stating patient family is requesting hospital bed for patient due to having 2 level house and patient unable to climb stairs. If okay need order to fax to [facility].

## 2020-10-07 ENCOUNTER — Other Ambulatory Visit: Payer: Self-pay

## 2020-10-07 ENCOUNTER — Encounter: Payer: Medicare Other | Attending: Registered Nurse | Admitting: Physical Medicine and Rehabilitation

## 2020-10-07 ENCOUNTER — Encounter: Payer: Self-pay | Admitting: Physical Medicine and Rehabilitation

## 2020-10-07 VITALS — BP 156/78 | HR 80 | Temp 98.3°F

## 2020-10-07 DIAGNOSIS — M25562 Pain in left knee: Secondary | ICD-10-CM | POA: Diagnosis not present

## 2020-10-07 DIAGNOSIS — G8929 Other chronic pain: Secondary | ICD-10-CM

## 2020-10-07 DIAGNOSIS — I639 Cerebral infarction, unspecified: Secondary | ICD-10-CM | POA: Insufficient documentation

## 2020-10-07 DIAGNOSIS — G8194 Hemiplegia, unspecified affecting left nondominant side: Secondary | ICD-10-CM | POA: Diagnosis not present

## 2020-10-07 DIAGNOSIS — Z993 Dependence on wheelchair: Secondary | ICD-10-CM | POA: Insufficient documentation

## 2020-10-07 MED ORDER — BACLOFEN 10 MG PO TABS: 5.0000 mg | ORAL_TABLET | Freq: Three times a day (TID) | ORAL | 5 refills | Status: DC | PRN

## 2020-10-07 MED ORDER — OXYBUTYNIN CHLORIDE ER 5 MG PO TB24
5.0000 mg | ORAL_TABLET | Freq: Every day | ORAL | 5 refills | Status: DC
Start: 2020-10-07 — End: 2020-11-01

## 2020-10-07 NOTE — Patient Instructions (Signed)
1. PT referral to Neurorehab- for stroke and L hemiparesis- can use estim.   2. Compression socks- Wear those during the day-  3. AND- lay down 1 hour before bed-going to sleep- during this time, leg fluid with equilibrate/ie. Filter through kidney, and then you'll pee- so will pee most of out before you go to sleep.  Laying down to rest during day will help   4. Developing mild spasticity- spasticity will get worse for up to 1-2 years s/p Stroke.   5. Baclofen 5 mg 3x/day AS NEEDED_ will truly help muscle tightness of L knee and L shoulde-r suggest using it. Most sedation-  And constipation.   6. Do ROM 5-6x/day of L arm and L leg-  So ROM of all major arm and leg joints 10x each time- 5-6x/day- AND lay on stomach in evening for 10 minutes before bed- should improve spasms/tightness 50-75%  7. Also discussed Botox as way to treat tightness/spasticity.   8. If don't use it, will lose it!!!!   9. F/U in 2 months

## 2020-10-07 NOTE — Progress Notes (Signed)
Subjective:    Patient ID: Nicole [name], female    DOB: 1929-11-14, 86 y.o.   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    Problem at night- with urination- with frequency- has to go a lot- sometimes every 90-120 minutes- Not during the day.  Pain in L leg at night- occ on right. Usually at night, common during day as well.   Sometimes when laying down, feels L leg drawing up.   Can turn self over in bed. Not really walking right- uses a RW to transfer mainly.      Pain Inventory Average Pain 6 Pain Right Now 0 My pain is dull  In the last 24 hours, has pain interfered with the following? General activity 0 Relation with others 0 Enjoyment of life 0 What TIME of day is your pain at its worst? night Sleep (in general) Poor  Pain is worse with: some activites Pain improves with: therapy/exercise Relief from Meds: n/a  Family History  Problem Relation Age of Onset   Diabetes Mother    Diabetes Other    Hypertension Other    Social History   Socioeconomic History   Marital status: Single    Spouse name: Not on file   Number of children: Not on file   Years of education: Not on file   Highest education level: Not on file  Occupational History   Not on file  Tobacco Use   Smoking status: Never Smoker   Smokeless tobacco: Never Used  Vaping Use   Vaping Use: Never used  Substance and Sexual Activity   Alcohol use: No    Alcohol/week: 0.0 standard drinks   Drug use: No   Sexual activity: Not on file  Other Topics Concern   Not on file  Social History Narrative   Not on file   Social Determinants of Health   Financial Resource Strain:    Difficulty of Paying Living Expenses: Not on file  Food Insecurity:    Worried About Running Out of Food in the Last Year: Not on file   Ran Out of Food in the Last Year: Not on file  Transportation Needs:    Lack of Transportation  (Medical): Not on file   Lack of Transportation (Non-Medical): Not on file  Physical Activity:    Days of Exercise per Week: Not on file   Minutes of Exercise per Session: Not on file  Stress:    Feeling of Stress : Not on file  Social Connections:    Frequency of Communication with Friends and Family: Not on file   Frequency of Social Gatherings with Friends and Family: Not on file   Attends Religious Services: Not on file   Active Member of Clubs or Organizations: Not on file   Attends Banker Meetings: Not on file   Marital Status: Not on file   History reviewed. No pertinent surgical history. History reviewed. No pertinent surgical history. Past Medical History:  Diagnosis Date   Hypertension    Stroke [facility])    September 2021   Thyroid disease    BP (!) 156/78    Pulse 80    Temp 98.3 F (36.8 C)    SpO2 93%   Opioid Risk Score:   Fall Risk Score:  `1  Depression screen PHQ 2/9  Depression screen [facility] 2/9 08/26/2020  Decreased Interest 1  Down, Depressed, Hopeless 1  PHQ - 2 Score 2  Altered sleeping 1  Tired, decreased energy 1  Change in appetite 3  Feeling bad or failure about yourself  3  Trouble concentrating 0  Moving slowly or fidgety/restless 0  Suicidal thoughts 1  PHQ-9 Score 11    Review of Systems  Constitutional: Negative.   HENT: Negative.   Eyes: Negative.   Respiratory: Negative.   Cardiovascular: Negative.   Gastrointestinal: Negative.   Endocrine: Negative.   Genitourinary: Negative.   Musculoskeletal: Positive for arthralgias and gait problem.  Skin: Negative.   Allergic/Immunologic: Negative.   Hematological: Negative.   Psychiatric/Behavioral: Negative.   All other systems reviewed and are negative.      Objective:   Physical Exam  Awake, alert, appropriate, accompanied by grandson, NAD LUE- biceps 4+/5, triceps 4+/5, WE 4/5, grip 4/5, finger abd 3+/5 LLE- HF 2+/5, KE 2-/5, DF 1/5, PF  2/5  Neuro: Forgetting to use LUE- had inattention/neglect.   MAS of 2 in L shoulder; MAS of 1 in L elbow and wrist MAS of 2 in L knee; MAS of 1+ to 2 in L hip and 1 in L ankle  Mild 1-2+ L foot/ankle swelling    Assessment & Plan:    1. PT referral to Neurorehab- for stroke and L hemiparesis- can use estim.   2. Compression socks- Wear those during the day-  3. AND- lay down 1 hour before bed-going to sleep- during this time, leg fluid with equilibrate/ie. Filter through kidney, and then you'll pee- so will pee most of out before you go to sleep.  Laying down to rest during day will help   4. Developing mild spasticity- spasticity will get worse for up to 1-2 years s/p Stroke.   5. Baclofen 5 mg 3x/day AS NEEDED_ will truly help muscle tightness of L knee and L shoulde-r suggest using it. Most sedation-  And constipation.   6. Do ROM 5-6x/day of L arm and L leg-  So ROM of all major arm and leg joints 10x each time- 5-6x/day- AND lay on stomach in evening for 10 minutes before bed- should improve spasms/tightness 50-75%  7. Also discussed Botox as way to treat tightness/spasticity.   8. If don't use it, will lose it!!!!  9. Will see if can see if gets her a hospital- can go up/down stairs-   9. F/U in 2 months  10. Refill Diptropan XL  5 mg daily- 5 Refills.   I spent a total of 45 minutes on visit- as detailed above.

## 2020-10-10 ENCOUNTER — Telehealth: Payer: Self-pay | Admitting: *Deleted

## 2020-10-10 NOTE — Telephone Encounter (Signed)
I don't think she has a history of cancer- as long as no hx of cancer, Estim would be great- thanks- ML

## 2020-10-10 NOTE — Telephone Encounter (Signed)
Enrique Sack PT with [facility] called to request Estim to L quad to assist in stair training.  Please advise if this is ok.

## 2020-10-10 NOTE — Telephone Encounter (Signed)
[name] notified 

## 2020-10-12 ENCOUNTER — Ambulatory Visit: Payer: Medicare Other | Attending: Physical Medicine and Rehabilitation

## 2020-10-12 ENCOUNTER — Telehealth: Payer: Self-pay

## 2020-10-12 ENCOUNTER — Other Ambulatory Visit: Payer: Self-pay

## 2020-10-12 DIAGNOSIS — R2689 Other abnormalities of gait and mobility: Secondary | ICD-10-CM | POA: Insufficient documentation

## 2020-10-12 DIAGNOSIS — R2681 Unsteadiness on feet: Secondary | ICD-10-CM | POA: Insufficient documentation

## 2020-10-12 DIAGNOSIS — R262 Difficulty in walking, not elsewhere classified: Secondary | ICD-10-CM | POA: Insufficient documentation

## 2020-10-12 DIAGNOSIS — I69354 Hemiplegia and hemiparesis following cerebral infarction affecting left non-dominant side: Secondary | ICD-10-CM | POA: Diagnosis not present

## 2020-10-12 DIAGNOSIS — M6281 Muscle weakness (generalized): Secondary | ICD-10-CM | POA: Diagnosis not present

## 2020-10-12 DIAGNOSIS — I6381 Other cerebral infarction due to occlusion or stenosis of small artery: Secondary | ICD-10-CM

## 2020-10-12 NOTE — Telephone Encounter (Signed)
Will place OT consult  And order hospital bed. Per pt and PT request.

## 2020-10-12 NOTE — Therapy (Signed)
[facility] [address] [place], [place], [address] Phone: [phone]   Fax:  [phone]  Physical Therapy Evaluation  Patient Details  Name: Nicole [name] MRN: [id number] Date of Birth: [birth year] Referring Provider (PT): Genice Rouge, MD   Encounter Date: 10/12/2020   PT End of Session - 10/12/20 0940    Visit Number 1    Number of Visits 17    Date for PT Re-Evaluation 01/10/21   POC for 8 weeks, Cert for 90 days   Authorization Type BCBS Medicare (10th Visit PN)    Progress Note Due on Visit 10    PT Start Time 0810   patient arriving late   PT Stop Time 0845    PT Time Calculation (min) 35 min    Equipment Utilized During Treatment Gait belt    Activity Tolerance Patient limited by pain    Behavior During Therapy [facility] for tasks assessed/performed           Past Medical History:  Diagnosis Date  . Hypertension   . Stroke [facility])    September 2021  . Thyroid disease     History reviewed. No pertinent surgical history.  There were no vitals filed for this visit.    Subjective Assessment - 10/12/20 0815    Subjective Patient had R Basal Ganglia CVA on 07/20/20 with L residual weakness. Patient has been at home since discharge and recieving home health services. Patient reports that she is not doing much walking, but does walk with RW a little during the day. Grandson said she had knee problems in the L knee prior to CVA. No falls since has been at home. Patient is currently living with her daughter. Prior to CVA was ambulating without AD and independent per patient reports.    Patient is accompained by: Family member   Grandson   Pertinent History R Basal Ganglia CVA, Hyperlipidemia, Hypothyroidism, Asthma, Glaucoma, HTN    Limitations Standing;Walking    Patient Stated Goals Be able to walk;    Currently in Pain? No/denies              [facility] PT Assessment - 10/12/20 0818      Assessment   Medical  Diagnosis R Basal Ganglia CVA    Referring Provider (PT) Genice Rouge, MD    Onset Date/Surgical Date 07/20/20    Hand Dominance Right    Prior Therapy [facility] Therapy      Precautions   Precautions Fall      Balance Screen   Has the patient fallen in the past 6 months No    Has the patient had a decrease in activity level because of a fear of falling?  Yes    Is the patient reluctant to leave their home because of a fear of falling?  Yes      Home Environment   Living Environment Private residence    Living Arrangements Children    Available Help at Discharge Family    Type of Home House    Home Access Level entry    Home Layout Two level    Alternate Level Stairs-Number of Steps 14-16    Alternate Level Stairs-Rails Right    Home Equipment Walker - 2 wheels;Wheelchair - manual;Tub bench    Additional Comments Grandson reports stays in wheelchair primarily within home;; use wheelchair for mobility      Prior Function   Level of Independence Independent   prior to CVA  Vocation Retired      Copy Status Within Functional Limits for tasks assessed      Observation/Other Assessments   Focus on Therapeutic Outcomes (FOTO)  12%      Sensation   Light Touch Impaired by gross assessment    Additional Comments Diminshed sensation on LLE       Coordination   Gross Motor Movements are Fluid and Coordinated No    Coordination and Movement Description decreased coordination on LLE      Tone   Assessment Location Left Lower Extremity      ROM / Strength   AROM / PROM / Strength AROM;Strength      AROM   Overall AROM  Deficits    Overall AROM Comments deficits in LLE due to strength impairments      Strength   Overall Strength Deficits    Strength Assessment Site Hip;Knee;Ankle    Right/Left Hip Right;Left    Right Hip Flexion 4/5    Right Hip ABduction 4/5    Right Hip ADduction 4/5    Left Hip Flexion 3-/5    Left Hip ABduction 3-/5    Left Hip  ADduction 3-/5    Right/Left Knee Right;Left    Right Knee Flexion 4/5    Right Knee Extension 4/5    Left Knee Flexion 3/5    Left Knee Extension 3/5    Right/Left Ankle Right;Left    Right Ankle Dorsiflexion 4+/5    Left Ankle Dorsiflexion 2+/5      Bed Mobility   Bed Mobility Rolling Right;Rolling Left;Supine to Sit;Sit to Supine    Rolling Right Contact Guard/Touching assist    Rolling Left Contact Guard/Touching assist    Supine to Sit Moderate Assistance - Patient 50-74%    Sit to Supine Minimal Assistance - Patient > 75%      Transfers   Transfers Sit to Stand;Stand to Sit;Stand Pivot Transfers    Sit to Stand 4: Min assist;3: Mod assist    Sit to Stand Details Tactile cues for weight shifting;Tactile cues for sequencing;Visual cues for safe use of DME/AE;Visual cues/gestures for precautions/safety;Verbal cues for safe use of DME/AE;Manual facilitation for weight shifting    Sit to Stand Details (indicate cue type and reason) Completed sit <> stand from w/c with RW, Patient require assistance for placement on LLE on RW. Verbal cues for hand placement required. PT requiring Mod - Min A to complete sit <> stand during session from low surface height    Stand to Sit 4: Min assist;4: Min guard    Stand to Sit Details (indicate cue type and reason) Verbal cues for safe use of DME/AE;Verbal cues for precautions/safety    Stand to Sit Details PT educating on reaching back to seat to promot eimproved control with descent. Min A for improved control required.     Stand Pivot Transfers 3: Mod assist    Stand Pivot Transfer Details (indicate cue type and reason) Mod A for completion, increased verbal/tactile cues for AD use and to promote improved safety with transfer.       Ambulation/Gait   Ambulation/Gait Yes    Ambulation/Gait Assistance 4: Min guard;4: Min assist    Ambulation/Gait Assistance Details compelted ambulation x 5 ft with RW. PT providing Min A for ambulation. Increased  pain reported in L Knee limiting distance ambulated.     Ambulation Distance (Feet) 5 Feet    Assistive device Rolling walker    Gait Pattern  Step-to pattern;Decreased arm swing - right;Decreased arm swing - left;Decreased step length - right;Decreased step length - left;Decreased stance time - left;Decreased hip/knee flexion - left;Decreased dorsiflexion - left;Decreased weight shift to left    Ambulation Surface Level;Indoor      LLE Tone   LLE Tone Modified Ashworth      LLE Tone   Modified Ashworth Scale for Grading Hypertonia LLE More marked increase in muscle tone through most of the ROM, but affected part(s) easily moved                      Objective measurements completed on examination: See above findings.               PT Education - 10/12/20 0939    Education Details Educated on [employer]/Evaluation Findings    Person(s) Educated Patient;Child(ren)    Methods Explanation    Comprehension Verbalized understanding            PT Short Term Goals - 10/12/20 0956      PT SHORT TERM GOAL #1   Title Patient will be independent with initial HEP with caregiver assistance (ALL STGS Due: 11/09/20)    Baseline No HEP established    Time 4    Period Weeks    Status New    Target Date 11/09/20      PT SHORT TERM GOAL #2   Title Patient will demo ability to compelte all bed mobility with CGA to demonstrate improved independence with functional mobility    Baseline Min - Mod A    Time 4    Period Weeks    Status New      PT SHORT TERM GOAL #3   Title Patient will demo ability to ambulate >= 30 ft with RW and CGA to demonstrate improved household ambulation    Baseline 5 ft    Time 4    Period Weeks    Status New      PT SHORT TERM GOAL #4   Title Patient will demo ability to complete sit <> stand with min A and RW to demonstrate improved functional mobility    Baseline Min - Mod A    Time 4    Period Weeks    Status New             PT Long  Term Goals - 10/12/20 [id number]      PT LONG TERM GOAL #1   Title Patient will be independent with final HEP with caregiver assistance (ALL LTGs Due: 12/07/20)    Baseline no HEP established    Time 8    Period Weeks    Status New    Target Date 12/07/20      PT LONG TERM GOAL #2   Title Patient will be able to ambulate >/= 115 ft on indoor level surfaces with CGA and LRAD to demonstrate improved household mobility    Baseline 5 ft    Time 8    Period Weeks    Status New      PT LONG TERM GOAL #3   Title Patient will demo ability to complete sit <> stand  and stand pivot transfer with CGA with RW to demosntrate improved independence with functional mobility    Baseline Min - Mod A    Time 8    Period Weeks    Status New      PT LONG TERM GOAL #4   Title LTG to be  set for TUG when able to assess    Baseline TBA    Time 8    Period Weeks    Status New                  Plan - 10/12/20 7829    Clinical Impression Statement Patient is an 86 y.o. female referred to Neuro OPPT services for R Basal Ganglia CVA. Patient's PMH is significant for the following: R Basal Ganglia CVA, Hyperlipidemia, Hypothyroidism, Asthma, Glaucoma, HTN. Patient presents with the following impairments upon evaluation: pain, impaired sensation, decreased strength, impaired balance, abnormal tone, abnormal gait, decreased functional mobility and increased risk for falls. Patient is currently using manual wheelchair for primary means of functional mobility within the home. Patient ambulating x 5 ft today with RW but require Min A and ambulation distance limited due to increased pain in L Knee. Patient requiring min - mod A during session for functional mobility. Patient will benefit from skilled PT services to maximize functional mobility and address impairments noted above.    Personal Factors and Comorbidities Comorbidity 3+;Time since onset of injury/illness/exacerbation;Age    Comorbidities R Basal Ganglia  CVA, Hyperlipidemia, Hypothyroidism, Asthma, Glaucoma, HTN    Examination-Activity Limitations Bed Mobility;Dressing;Locomotion Level;Reach Overhead;Stairs;Stand;Transfers    Examination-Participation Restrictions Psychiatric nurse Evolving/Moderate complexity    Clinical Decision Making Moderate    Rehab Potential Fair    PT Frequency 2x / week    PT Duration 8 weeks    PT Treatment/Interventions ADLs/Self Care Home Management;Cryotherapy;Electrical Stimulation;Moist Heat;DME Instruction;Gait training;Stair training;Functional mobility training;Therapeutic activities;Therapeutic exercise;Balance training;Neuromuscular re-education;Patient/family education;Orthotic Fit/Training;Manual techniques;Passive range of motion    PT Next Visit Plan Initiate HEP; Gait training with RW    Recommended Other Services Occupational Therapy    Consulted and Agree with Plan of Care Patient           Patient will benefit from skilled therapeutic intervention in order to improve the following deficits and impairments:  Abnormal gait, Decreased balance, Decreased mobility, Decreased endurance, Difficulty walking, Impaired tone, Impaired sensation, Pain, Decreased strength, Decreased safety awareness, Decreased knowledge of use of DME, Decreased coordination, Decreased activity tolerance, Decreased range of motion  Visit Diagnosis: Hemiplegia and hemiparesis following cerebral infarction affecting left non-dominant side (HCC)  Difficulty in walking, not elsewhere classified  Muscle weakness (generalized)  Other abnormalities of gait and mobility  Unsteadiness on feet     Problem List Patient Active Problem List   Diagnosis Date Noted  . Wheelchair dependence 10/07/2020  . Left knee pain 07/26/2020  . Cerebrovascular accident (CVA) of right basal ganglia (HCC) 07/26/2020  . Left hemiparesis (HCC) 07/25/2020  . Hypokalemia 07/20/2020  . Right basal  ganglia embolic stroke (HCC) 07/20/2020  . Essential hypertension 07/20/2020  . Hypothyroidism 07/20/2020    Tempie Donning, PT, DPT 10/12/2020, 10:02 AM  [facility] [address] [place], [place], [address] Phone: [phone]   Fax:  [phone]  Name: Nicole [name] MRN: [id number] Date of Birth: [birth year]

## 2020-10-12 NOTE — Telephone Encounter (Signed)
Dr. Berline Chough, Twila Rappa was evaluated by Physical Therapy on 10/12/20.  The patient would benefit from Occupational Therapy evaluation for residual LUE impairments post CVA.    If you agree, please place an order in [facility] workque in [facility] or fax the order to [phone]. Thank you, Adelfa Koh, PT, DPT  [facility] [address] [place], [place]  [address] Phone:  [phone] Fax:  [phone]

## 2020-10-14 ENCOUNTER — Telehealth: Payer: Self-pay | Admitting: *Deleted

## 2020-10-14 DIAGNOSIS — R350 Frequency of micturition: Secondary | ICD-10-CM

## 2020-10-14 DIAGNOSIS — I6381 Other cerebral infarction due to occlusion or stenosis of small artery: Secondary | ICD-10-CM

## 2020-10-14 NOTE — Telephone Encounter (Signed)
Per Dr Berline Chough she was on Ditropan in the hospital as well.  She has requested a referral be placed to Urology. Placed.

## 2020-10-14 NOTE — Telephone Encounter (Signed)
Nicole [name]'s daughter called and reports that her mother is going to the bathroom every hour and it is not small amounts. She says the ditropan is not working but what ever Dr Berline Chough gave her in the hospital worked but this does not seem to be.  There are no other symptoms of UTI, it is just frequency --literally every hour day and night.  Please advise.

## 2020-10-17 ENCOUNTER — Ambulatory Visit: Payer: Medicare Other | Admitting: Neurology

## 2020-10-17 ENCOUNTER — Telehealth: Payer: Self-pay | Admitting: Neurology

## 2020-10-17 NOTE — Telephone Encounter (Signed)
Curtis from [facility] called to find out if MRI should be filed by insurance or as a research study. He is needing to confirm this. Please advise.   Best contact: [phone] ext. [phone]

## 2020-10-18 ENCOUNTER — Other Ambulatory Visit: Payer: Self-pay

## 2020-10-18 ENCOUNTER — Other Ambulatory Visit: Payer: Self-pay | Admitting: Adult Health

## 2020-10-18 ENCOUNTER — Ambulatory Visit (HOSPITAL_COMMUNITY)
Admission: RE | Admit: 2020-10-18 | Discharge: 2020-10-18 | Disposition: A | Discharge status: Home / Self Care | Payer: Medicare Other | Source: Ambulatory Visit | Attending: Neurology | Admitting: Neurology

## 2020-10-18 DIAGNOSIS — I6782 Cerebral ischemia: Secondary | ICD-10-CM | POA: Diagnosis not present

## 2020-10-18 DIAGNOSIS — I6300 Cerebral infarction due to thrombosis of unspecified precerebral artery: Secondary | ICD-10-CM | POA: Diagnosis present

## 2020-10-18 IMAGING — MR MR HEAD W/O CM
6 series · 48 of 48 positions shown · non-contrast
Comparison: Brain MRI [DATE]

CLINICAL DATA: Stroke follow-up.

EXAM:
MRI HEAD WITHOUT CONTRAST
TECHNIQUE: Multiplanar, multiecho pulse sequences of the brain and surrounding
structures were obtained without intravenous contrast.

[Series 2: DWI · axial · 5.0mm · 0.94mm/px · z∈[-61,+84]mm · 13 of 60 slices shown]
[im 1/60]
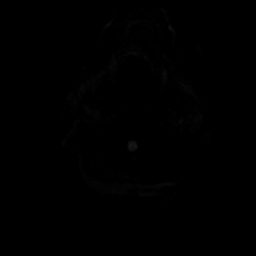
[im 5/60]
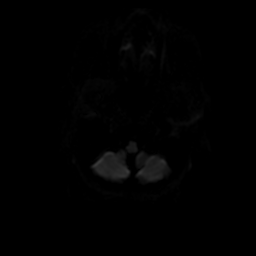
[im 10/60]
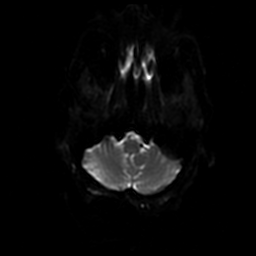
[im 15/60]
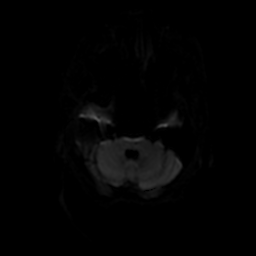
[im 20/60]
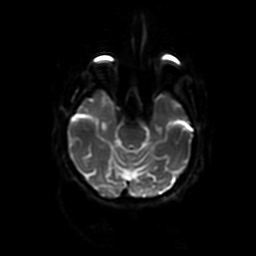
[im 25/60]
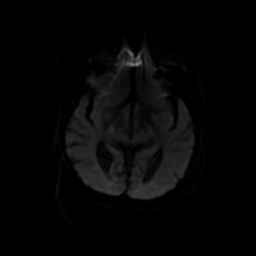
[im 30/60]
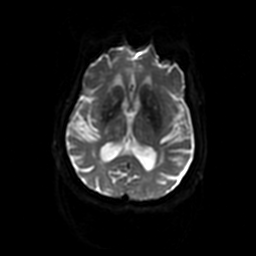
[im 35/60]
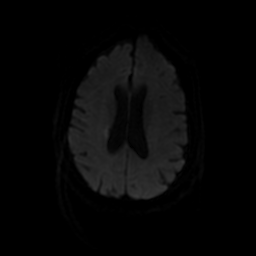
[im 40/60]
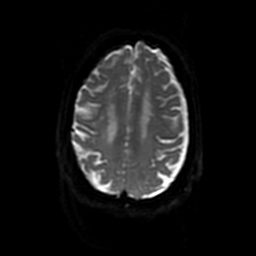
[im 45/60]
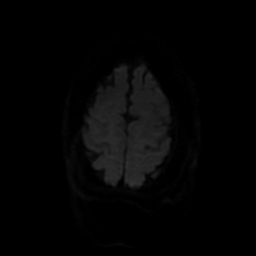
[im 50/60]
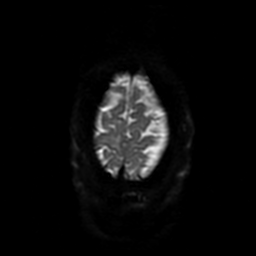
[im 55/60]
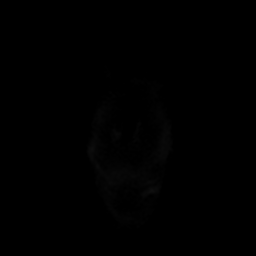
[im 60/60]
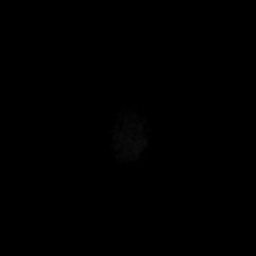

[Series 3: FLAIR · axial · 5.0mm · 0.94mm/px · z∈[-61,+84]mm · 7 of 30 slices shown (1 of 2)]
[im 1/30]
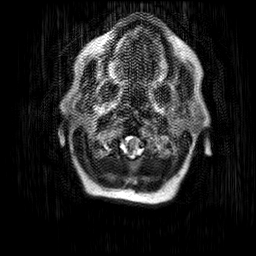
[im 5/30]
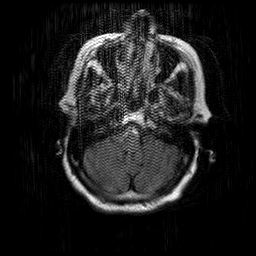
[im 10/30]
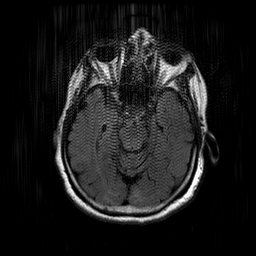
[im 15/30]
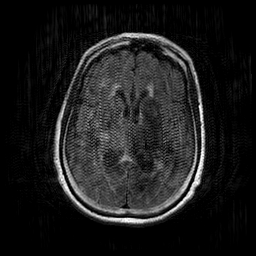
[im 20/30]
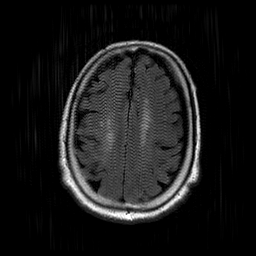
[im 25/30]
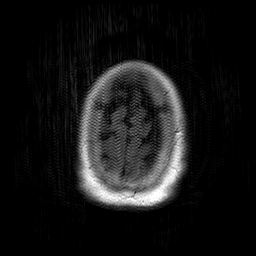
[im 30/30]
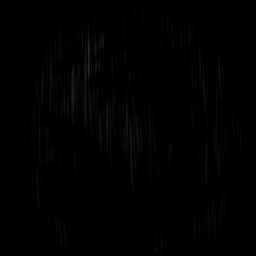

[Series 4: FLAIR · axial · 5.0mm · 0.94mm/px · z∈[-61,+84]mm · 7 of 30 slices shown (2 of 2)]
[im 1/30]
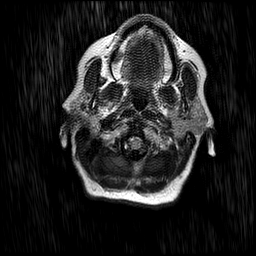
[im 5/30]
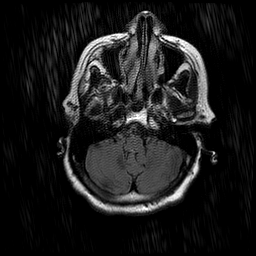
[im 10/30]
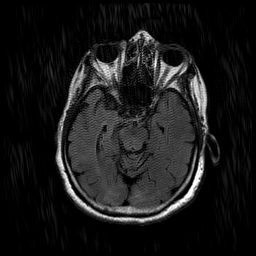
[im 15/30]
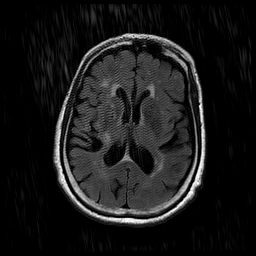
[im 20/30]
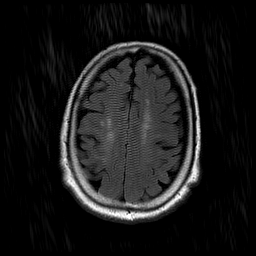
[im 25/30]
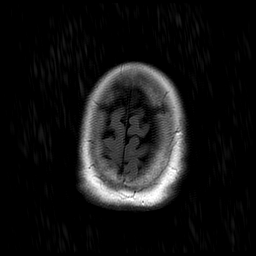
[im 30/30]
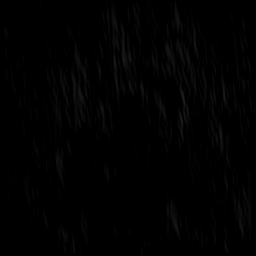

[Series 5: T2-star · axial · 5.0mm · 0.94mm/px · z∈[-60,+85]mm · 7 of 30 slices shown]
[im 1/30]
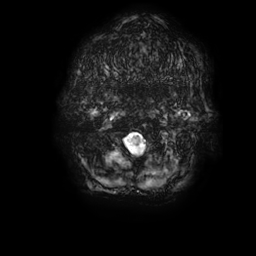
[im 5/30]
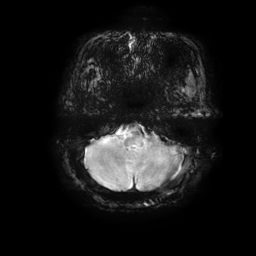
[im 10/30]
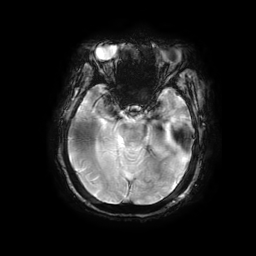
[im 15/30]
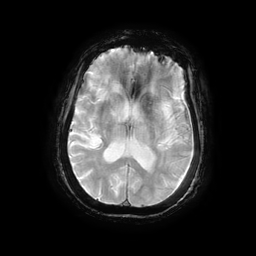
[im 20/30]
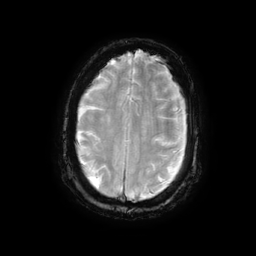
[im 25/30]
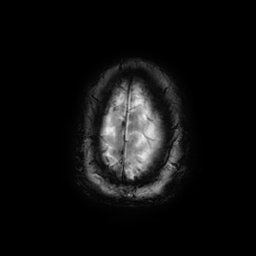
[im 30/30]
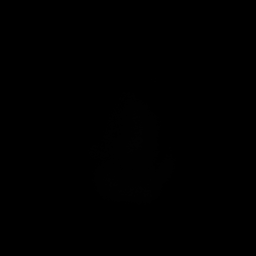

[Series 6: T1 · axial · 5.0mm · 0.94mm/px · z∈[-49,+96]mm · 7 of 30 slices shown]
[im 1/30]
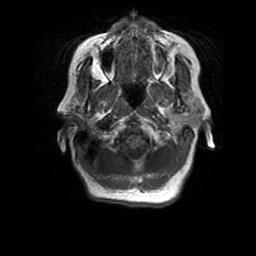
[im 5/30]
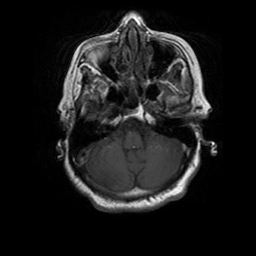
[im 10/30]
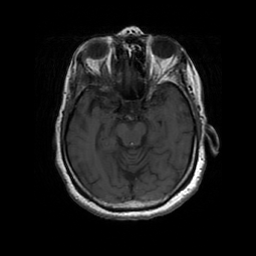
[im 15/30]
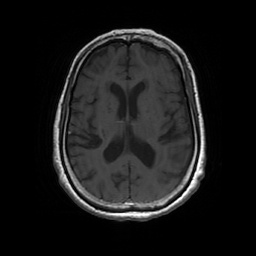
[im 20/30]
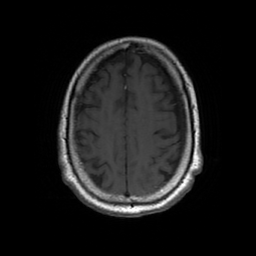
[im 25/30]
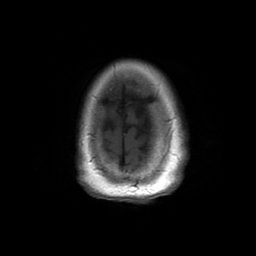
[im 30/30]
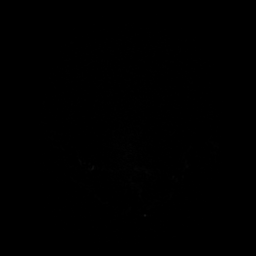

[Series 250: ADC · axial · 5.0mm · 0.94mm/px · z∈[-61,+84]mm · 7 of 30 slices shown]
[im 1/30]
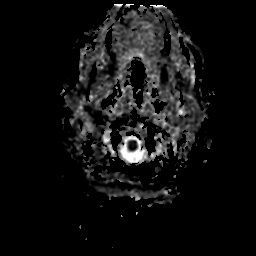
[im 5/30]
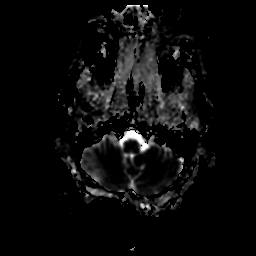
[im 10/30]
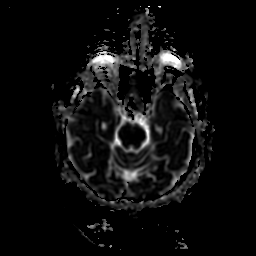
[im 15/30]
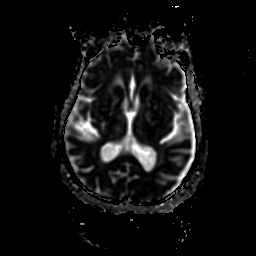
[im 20/30]
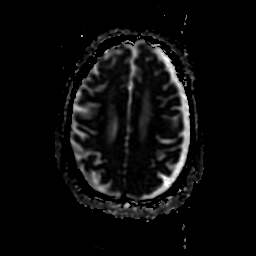
[im 25/30]
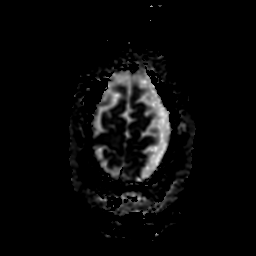
[im 30/30]
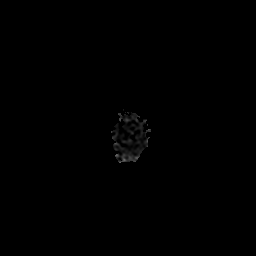

[48 of 48 positions shown; findings below may reference images not displayed]

FINDINGS: Some sequences are degraded by motion.

No acute infarct, mass effect or extra-axial collection. No acute or
chronic hemorrhage. There is multifocal hyperintense T2-weighted
signal within the white matter. Parenchymal volume and CSF spaces
are normal. The midline structures are normal. Recent right
lentiform nucleus/centrum semiovale infarct without diffusion
abnormality.
IMPRESSION: 1. No acute intracranial abnormality.
2. Recent right lentiform nucleus/centrum semiovale infarct.
3. Moderate chronic small vessel ischemic disease.

## 2020-10-18 MED ORDER — CLOPIDOGREL BISULFATE 75 MG PO TABS: 75.0000 mg | ORAL_TABLET | Freq: Every day | ORAL | 3 refills | Status: DC

## 2020-10-18 NOTE — Progress Notes (Signed)
90-day BMS trial completed today.  In regards to antithrombotics, previously on aspirin prior to stroke therefore recommend use of Plavix for secondary stroke prevention.  Advised daughter to discontinue aspirin and a prescription of Plavix will be sent to pharmacy.

## 2020-10-19 ENCOUNTER — Ambulatory Visit: Payer: Medicare Other

## 2020-10-19 VITALS — BP 151/79 | HR 92

## 2020-10-19 DIAGNOSIS — M6281 Muscle weakness (generalized): Secondary | ICD-10-CM | POA: Diagnosis not present

## 2020-10-19 DIAGNOSIS — I69354 Hemiplegia and hemiparesis following cerebral infarction affecting left non-dominant side: Secondary | ICD-10-CM

## 2020-10-19 DIAGNOSIS — R262 Difficulty in walking, not elsewhere classified: Secondary | ICD-10-CM | POA: Diagnosis not present

## 2020-10-19 DIAGNOSIS — R2689 Other abnormalities of gait and mobility: Secondary | ICD-10-CM

## 2020-10-19 DIAGNOSIS — R2681 Unsteadiness on feet: Secondary | ICD-10-CM

## 2020-10-19 DIAGNOSIS — M170 Bilateral primary osteoarthritis of knee: Secondary | ICD-10-CM | POA: Diagnosis not present

## 2020-10-19 DIAGNOSIS — M25562 Pain in left knee: Secondary | ICD-10-CM | POA: Diagnosis not present

## 2020-10-19 DIAGNOSIS — G8194 Hemiplegia, unspecified affecting left nondominant side: Secondary | ICD-10-CM | POA: Diagnosis not present

## 2020-10-19 DIAGNOSIS — I6381 Other cerebral infarction due to occlusion or stenosis of small artery: Secondary | ICD-10-CM | POA: Diagnosis not present

## 2020-10-19 NOTE — Therapy (Signed)
[facility] [address] [place], [place], [address] Phone: [phone]   Fax:  [phone]  Physical Therapy Treatment  Patient Details  Name: Nicole [name] MRN: [id number] Date of Birth: August 27, 1930 Referring Provider (PT): Genice Rouge, MD   Encounter Date: 10/19/2020   PT End of Session - 10/19/20 0803    Visit Number 2    Number of Visits 17    Date for PT Re-Evaluation 01/10/21   POC for 8 weeks, Cert for 90 days   Authorization Type BCBS Medicare (10th Visit PN)    Progress Note Due on Visit 10    PT Start Time 0802    PT Stop Time 0847    PT Time Calculation (min) 45 min    Equipment Utilized During Treatment Gait belt    Activity Tolerance Patient limited by pain    Behavior During Therapy [facility] for tasks assessed/performed           Past Medical History:  Diagnosis Date  . Hypertension   . Stroke [facility])    September 2021  . Thyroid disease     History reviewed. No pertinent surgical history.  Vitals:   10/19/20 0808  BP: (!) 151/79  Pulse: 92     Subjective Assessment - 10/19/20 0805    Subjective No new changes since initial visit. No falls. No pain today. Does report she had knee pain all night long approx two nights ago.    Patient is accompained by: Family member   Grandson   Pertinent History R Basal Ganglia CVA, Hyperlipidemia, Hypothyroidism, Asthma, Glaucoma, HTN    Limitations Standing;Walking    Patient Stated Goals Be able to walk;    Currently in Pain? No/denies              [facility] Adult PT Treatment/Exercise - 10/19/20 0001      Bed Mobility   Bed Mobility Rolling Right;Rolling Left;Supine to Sit;Sit to Supine    Rolling Right Contact Guard/Touching assist    Rolling Left Contact Guard/Touching assist    Supine to Sit Minimal Assistance - Patient > 75%   with log roll method   Sit to Supine Minimal Assistance - Patient > 75%      Transfers   Transfers Sit to Stand;Stand  to Sit;Stand Pivot Transfers    Sit to Stand 4: Min assist    Sit to Stand Details Tactile cues for weight shifting;Tactile cues for sequencing;Visual cues for safe use of DME/AE;Visual cues/gestures for precautions/safety;Verbal cues for safe use of DME/AE;Manual facilitation for weight shifting    Sit to Stand Details (indicate cue type and reason) completed sit <> stand from w/c with RW, verbal cues for hand placement.    Stand to Sit 4: Min assist    Stand to Sit Details (indicate cue type and reason) Verbal cues for safe use of DME/AE;Verbal cues for precautions/safety    Stand to Sit Details verbal cues to reach back to surface prior to descent    Stand Pivot Transfers 4: Min assist    Stand Pivot Transfer Details (indicate cue type and reason) Completed transfer from w/c <> mat with patient requiring Min A for turn and verbal cues for competion. Patient demo increased weight shift to RLE, and therefore difficulty advancing RLE. Fearful of shifting weight to LLE, as patient reports feels knee is going to give out on her. PT providing manual faciliation at pelvis for improved lateral weight shift.  Therapeutic Activites    Therapeutic Activities Other Therapeutic Activities    Other Therapeutic Activities Completed bed mobility training with patient and grandson to promote improved ability to complete supine <> sit. PT providing verbal cues to use RLE, and hook under LLE with completion of bed mobility to promtoe improved independence. PT also educating patient and grandson on log roll method to promote improved bed mobility. Completed Log Roll to R, and sidelying <> sit x 3 reps. PT providing tactile/verbals cues. Patient report mild stretching in left trunk.      Exercises   Exercises Other Exercises    Other Exercises  PT completed PROM to L Knee into flexion/extension x 10 reps to promote reduction in tone and improved motion. Established Initial HEP for BLE strengthening and AROM. See  Med[employer] Program below for details.            Access Code: [email or web address] URL: [email or web address] Date: 10/19/2020 Prepared by: Jethro Bastos  Exercises Supine Bridge - 1 x daily - 5 x weekly - 2 sets - 10 reps Supine March - 1 x daily - 5 x weekly - 2 sets - 10 reps Seated Heel Raise - 1 x daily - 5 x weekly - 2 sets - 10 reps     PT Education - 10/19/20 0857    Education Details Bed Mobility Technique (See TA); Initial HEP    Person(s) Educated Patient    Methods Explanation;Demonstration;Handout    Comprehension Verbalized understanding;Returned demonstration;Verbal cues required;Need further instruction            PT Short Term Goals - 10/12/20 0956      PT SHORT TERM GOAL #1   Title Patient will be independent with initial HEP with caregiver assistance (ALL STGS Due: 11/09/20)    Baseline No HEP established    Time 4    Period Weeks    Status New    Target Date 11/09/20      PT SHORT TERM GOAL #2   Title Patient will demo ability to compelte all bed mobility with CGA to demonstrate improved independence with functional mobility    Baseline Min - Mod A    Time 4    Period Weeks    Status New      PT SHORT TERM GOAL #3   Title Patient will demo ability to ambulate >= 30 ft with RW and CGA to demonstrate improved household ambulation    Baseline 5 ft    Time 4    Period Weeks    Status New      PT SHORT TERM GOAL #4   Title Patient will demo ability to complete sit <> stand with min A and RW to demonstrate improved functional mobility    Baseline Min - Mod A    Time 4    Period Weeks    Status New             PT Long Term Goals - 10/12/20 6226      PT LONG TERM GOAL #1   Title Patient will be independent with final HEP with caregiver assistance (ALL LTGs Due: 12/07/20)    Baseline no HEP established    Time 8    Period Weeks    Status New    Target Date 12/07/20      PT LONG TERM GOAL #2   Title Patient will be able to ambulate  >/= 115 ft on indoor level surfaces with CGA and LRAD to demonstrate improved  household mobility    Baseline 5 ft    Time 8    Period Weeks    Status New      PT LONG TERM GOAL #3   Title Patient will demo ability to complete sit <> stand  and stand pivot transfer with CGA with RW to demosntrate improved independence with functional mobility    Baseline Min - Mod A    Time 8    Period Weeks    Status New      PT LONG TERM GOAL #4   Title LTG to be set for TUG when able to assess    Baseline TBA    Time 8    Period Weeks    Status New                 Plan - 10/19/20 0858    Clinical Impression Statement Today's skilled PT session included completion of transfer training and bed mobility training, with PT educating and completion log roll and sidelying <> sit to promote with patient and family member to promote improved bed mobility at home. Rest of session spent establishing initial HEP for BLE strengthening/AROM. Will continue to benefit from skilled PT services to progress toward all STG/LTGs.    Personal Factors and Comorbidities Comorbidity 3+;Time since onset of injury/illness/exacerbation;Age    Comorbidities R Basal Ganglia CVA, Hyperlipidemia, Hypothyroidism, Asthma, Glaucoma, HTN    Examination-Activity Limitations Bed Mobility;Dressing;Locomotion Level;Reach Overhead;Stairs;Stand;Transfers    Examination-Participation Restrictions Psychiatric nurse Evolving/Moderate complexity    Rehab Potential Fair    PT Frequency 2x / week    PT Duration 8 weeks    PT Treatment/Interventions ADLs/Self Care Home Management;Cryotherapy;Electrical Stimulation;Moist Heat;DME Instruction;Gait training;Stair training;Functional mobility training;Therapeutic activities;Therapeutic exercise;Balance training;Neuromuscular re-education;Patient/family education;Orthotic Fit/Training;Manual techniques;Passive range of motion    PT Next Visit Plan  How was Exercises? Standing Activities working on improved weight shift to LLE, neutral alignment. NMR activites to promote reduced tone in LLE.    PT Home Exercise Plan Access Code: [email or web address]    Consulted and Agree with Plan of Care Patient           Patient will benefit from skilled therapeutic intervention in order to improve the following deficits and impairments:  Abnormal gait,Decreased balance,Decreased mobility,Decreased endurance,Difficulty walking,Impaired tone,Impaired sensation,Pain,Decreased strength,Decreased safety awareness,Decreased knowledge of use of DME,Decreased coordination,Decreased activity tolerance,Decreased range of motion  Visit Diagnosis: Hemiplegia and hemiparesis following cerebral infarction affecting left non-dominant side (HCC)  Difficulty in walking, not elsewhere classified  Muscle weakness (generalized)  Unsteadiness on feet  Other abnormalities of gait and mobility     Problem List Patient Active Problem List   Diagnosis Date Noted  . Wheelchair dependence 10/07/2020  . Left knee pain 07/26/2020  . Cerebrovascular accident (CVA) of right basal ganglia (HCC) 07/26/2020  . Left hemiparesis (HCC) 07/25/2020  . Hypokalemia 07/20/2020  . Right basal ganglia embolic stroke (HCC) 07/20/2020  . Essential hypertension 07/20/2020  . Hypothyroidism 07/20/2020    Tempie Donning, PT, DPT 10/19/2020, 9:01 AM  [facility] [address] [place], [place], [address] Phone: [phone]   Fax:  [phone]  Name: Nicole [name] MRN: [id number] Date of Birth: [birth year]

## 2020-10-19 NOTE — Telephone Encounter (Signed)
Left message for Nicole [name] on voicemail.  Per Cathleen Corti, this patient's MRI should be billed to research study.  Left office number for return call if there were any additional questions.

## 2020-10-19 NOTE — Patient Instructions (Signed)
Access Code: [email or web address] URL: [email or web address] Date: 10/19/2020 Prepared by: Jethro Bastos  Exercises Supine Bridge - 1 x daily - 5 x weekly - 2 sets - 10 reps Supine March - 1 x daily - 5 x weekly - 2 sets - 10 reps Seated Heel Raise - 1 x daily - 5 x weekly - 2 sets - 10 reps

## 2020-10-21 ENCOUNTER — Ambulatory Visit: Payer: Medicare Other

## 2020-10-24 DIAGNOSIS — I10 Essential (primary) hypertension: Secondary | ICD-10-CM | POA: Diagnosis not present

## 2020-10-24 DIAGNOSIS — R059 Cough, unspecified: Secondary | ICD-10-CM | POA: Diagnosis not present

## 2020-10-26 ENCOUNTER — Ambulatory Visit: Payer: Medicare Other | Admitting: Physical Therapy

## 2020-10-26 ENCOUNTER — Encounter: Payer: Self-pay | Admitting: Physical Therapy

## 2020-10-26 ENCOUNTER — Other Ambulatory Visit: Payer: Self-pay

## 2020-10-26 DIAGNOSIS — I69354 Hemiplegia and hemiparesis following cerebral infarction affecting left non-dominant side: Secondary | ICD-10-CM

## 2020-10-26 DIAGNOSIS — R2681 Unsteadiness on feet: Secondary | ICD-10-CM

## 2020-10-26 DIAGNOSIS — M6281 Muscle weakness (generalized): Secondary | ICD-10-CM

## 2020-10-26 DIAGNOSIS — M170 Bilateral primary osteoarthritis of knee: Secondary | ICD-10-CM | POA: Diagnosis not present

## 2020-10-26 DIAGNOSIS — R262 Difficulty in walking, not elsewhere classified: Secondary | ICD-10-CM | POA: Diagnosis not present

## 2020-10-26 DIAGNOSIS — R2689 Other abnormalities of gait and mobility: Secondary | ICD-10-CM | POA: Diagnosis not present

## 2020-10-26 NOTE — Patient Instructions (Signed)
Access Code: [email or web address] URL: [email or web address] Date: 10/26/2020 Prepared by: Sherlie Ban  Exercises Supine Bridge - 1 x daily - 5 x weekly - 2 sets - 5 reps Supine March - 1 x daily - 5 x weekly - 2 sets - 10 reps Seated Heel Raise - 1 x daily - 5 x weekly - 2 sets - 10 reps Bent Knee Fallouts - 1 x daily - 5 x weekly - 2 sets - 10 reps Supine Heel Slide - 1 x daily - 5 x weekly - 2 sets - 10 reps

## 2020-10-26 NOTE — Therapy (Signed)
[facility] [address] [place], [place], [address] Phone: [phone]   Fax:  [phone]  Physical Therapy Treatment  Patient Details  Name: Nicole [name] MRN: [id number] Date of Birth: May 10, 1930 Referring Provider (PT): Genice Rouge, MD   Encounter Date: 10/26/2020   PT End of Session - 10/26/20 0848    Visit Number 3    Number of Visits 17    Date for PT Re-Evaluation 01/10/21   POC for 8 weeks, Cert for 90 days   Authorization Type BCBS Medicare (10th Visit PN)    Progress Note Due on Visit 10    PT Start Time 0801    PT Stop Time 0843    PT Time Calculation (min) 42 min    Equipment Utilized During Treatment Gait belt    Activity Tolerance Patient limited by pain;Patient tolerated treatment well    Behavior During Therapy [facility] for tasks assessed/performed           Past Medical History:  Diagnosis Date  . Hypertension   . Stroke [facility])    September 2021  . Thyroid disease     History reviewed. No pertinent surgical history.  There were no vitals filed for this visit.   Subjective Assessment - 10/26/20 0804    Subjective No changes since last time. No falls. Has not been doing the exercises, lost the papers. Is not walking at home.    Patient is accompained by: Family member   Grandson   Pertinent History R Basal Ganglia CVA, Hyperlipidemia, Hypothyroidism, Asthma, Glaucoma, HTN    Limitations Standing;Walking    Patient Stated Goals Be able to walk;    Currently in Pain? No/denies                             [facility] Adult PT Treatment/Exercise - 10/26/20 0001      Bed Mobility   Supine to Sit Contact Guard/Touching assist   reminder cues for log roll technique   Sit to Supine Contact Guard/Touching assist      Transfers   Transfers Sit to Stand;Stand to Sit;Stand Pivot Transfers    Sit to Stand 4: Min assist    Sit to Stand Details Tactile cues for sequencing;Visual cues  for safe use of DME/AE;Visual cues/gestures for precautions/safety;Verbal cues for safe use of DME/AE;Manual facilitation for weight shifting    Sit to Stand Details (indicate cue type and reason) completed x3 reps total sit <> stand throughout session, pt attempted one rep and not fully able to come to stand and sat back down to mat with min A, provided cues to scoot towards edge of mat and for bringing feet closer towards her with pt able to perform on first attempt    Stand to Sit 4: Min assist    Stand to Sit Details (indicate cue type and reason) Verbal cues for safe use of DME/AE;Verbal cues for precautions/safety    Stand to Sit Details cues to reach back towards surface and to fully feel legs on back of surface before sitting down, decr eccentric control    Stand Pivot Transfers 4: Min assist    Stand Pivot Transfer Details (indicate cue type and reason) perfomed from w/c <> mat table with RW, pt with decr weight shift on LLE and performs in small shuffled steps, needing verbal cues for sequencing and proper foot placement    Comments standing with RW at edge of  mat table: use of mirror for visual feedback, cues to weight shift towards LLE as pt heavily leaning towards R side, in standing perform 5 reps weight shifting R/L with min A      Exercises   Exercises Knee/Hip      Knee/Hip Exercises: Seated   Long Arc Quad AROM;Strengthening;Left    Long Arc Quad Limitations attempted a couple reps on LLE, pt unable to perform without leaning backwards and with decr ROM    Other Seated Knee/Hip Exercises performed seated heel slides with LLE with towel under foot for decr friction x10 reps, pt reports unable t operform these at home and she is staying on the 2nd floor and there is only carpet      Knee/Hip Exercises: Supine   Quad Sets Strengthening;AROM;Left    Quad Sets Limitations pt with limited knee extension ROM on LLE, attempted quad sets with multi-modal cues, however pt unable to perform  with multiple attempts and performing a glute set instead    Short Arc [employer] Strengthening;AROM;Left    Short Arc Quad Sets Limitations attempted with LLE over bolster, pt unable to perform despite multi modal cues and AAROM initially, pt actively performing L hip flexion instead    Heel Slides Strengthening;AROM;Left;2 sets;10 reps    Bridges Strengthening;AROM;2 sets;5 reps    Bridges Limitations initial cues for technique    Other Supine Knee/Hip Exercises bent knee fall outs with LLE 2 x 10 reps, visual cues for technique               Access Code: [email or web address] URL: [email or web address] Date: 10/26/2020 Prepared by: Sherlie Ban   New additions to HEP bolded below:   Exercises Supine Bridge - 1 x daily - 5 x weekly - 2 sets - 5 reps Supine March - 1 x daily - 5 x weekly - 2 sets - 10 reps Seated Heel Raise - 1 x daily - 5 x weekly - 2 sets - 10 reps Bent Knee Fallouts - 1 x daily - 5 x weekly - 2 sets - 10 reps Supine Heel Slide - 1 x daily - 5 x weekly - 2 sets - 10 reps    PT Education - 10/26/20 0847    Education Details new additions to HEP    Person(s) Educated Patient   pt's friend   Methods Explanation;Demonstration;Verbal cues;Handout    Comprehension Verbalized understanding;Returned demonstration            PT Short Term Goals - 10/12/20 0956      PT SHORT TERM GOAL #1   Title Patient will be independent with initial HEP with caregiver assistance (ALL STGS Due: 11/09/20)    Baseline No HEP established    Time 4    Period Weeks    Status New    Target Date 11/09/20      PT SHORT TERM GOAL #2   Title Patient will demo ability to compelte all bed mobility with CGA to demonstrate improved independence with functional mobility    Baseline Min - Mod A    Time 4    Period Weeks    Status New      PT SHORT TERM GOAL #3   Title Patient will demo ability to ambulate >= 30 ft with RW and CGA to demonstrate improved household ambulation     Baseline 5 ft    Time 4    Period Weeks    Status New      PT SHORT  TERM GOAL #4   Title Patient will demo ability to complete sit <> stand with min A and RW to demonstrate improved functional mobility    Baseline Min - Mod A    Time 4    Period Weeks    Status New             PT Long Term Goals - 10/12/20 [id number]      PT LONG TERM GOAL #1   Title Patient will be independent with final HEP with caregiver assistance (ALL LTGs Due: 12/07/20)    Baseline no HEP established    Time 8    Period Weeks    Status New    Target Date 12/07/20      PT LONG TERM GOAL #2   Title Patient will be able to ambulate >/= 115 ft on indoor level surfaces with CGA and LRAD to demonstrate improved household mobility    Baseline 5 ft    Time 8    Period Weeks    Status New      PT LONG TERM GOAL #3   Title Patient will demo ability to complete sit <> stand  and stand pivot transfer with CGA with RW to demosntrate improved independence with functional mobility    Baseline Min - Mod A    Time 8    Period Weeks    Status New      PT LONG TERM GOAL #4   Title LTG to be set for TUG when able to assess    Baseline TBA    Time 8    Period Weeks    Status New                 Plan - 10/26/20 1529    Clinical Impression Statement Added extra supine strengthening exercises to pt's HEP and re-printed handout as pt reports that she lost the old one. Pt needing min A to perform sit <> stands with verbal and demo cues for proper technique. Performed standing weight shifting with RW, with pt leaning towards R, pt responded well to visual cue of mirror to shift weight to L. Pt with incr B knee flexion and genu valgum in standing. Will continue to progress towards LTGs.    Personal Factors and Comorbidities Comorbidity 3+;Time since onset of injury/illness/exacerbation;Age    Comorbidities R Basal Ganglia CVA, Hyperlipidemia, Hypothyroidism, Asthma, Glaucoma, HTN    Examination-Activity Limitations Bed  Mobility;Dressing;Locomotion Level;Reach Overhead;Stairs;Stand;Transfers    Examination-Participation Restrictions Psychiatric nurseCommunity Activity;Cleaning    Stability/Clinical Decision Making Evolving/Moderate complexity    Rehab Potential Fair    PT Frequency 2x / week    PT Duration 8 weeks    PT Treatment/Interventions ADLs/Self Care Home Management;Cryotherapy;Electrical Stimulation;Moist Heat;DME Instruction;Gait training;Stair training;Functional mobility training;Therapeutic activities;Therapeutic exercise;Balance training;Neuromuscular re-education;Patient/family education;Orthotic Fit/Training;Manual techniques;Passive range of motion    PT Next Visit Plan how is HEP? LLE strengthening, L hamstring stretching. Standing Activities working on improved weight shift to LLE with RW.NMR activites to promote reduced tone in LLE, sit <> stands.    PT Home Exercise Plan Access Code: [email or web address]    Consulted and Agree with Plan of Care Patient           Patient will benefit from skilled therapeutic intervention in order to improve the following deficits and impairments:  Abnormal gait,Decreased balance,Decreased mobility,Decreased endurance,Difficulty walking,Impaired tone,Impaired sensation,Pain,Decreased strength,Decreased safety awareness,Decreased knowledge of use of DME,Decreased coordination,Decreased activity tolerance,Decreased range of motion  Visit Diagnosis: Hemiplegia and hemiparesis following cerebral infarction affecting  left non-dominant side (HCC)  Difficulty in walking, not elsewhere classified  Muscle weakness (generalized)  Unsteadiness on feet     Problem List Patient Active Problem List   Diagnosis Date Noted  . Wheelchair dependence 10/07/2020  . Left knee pain 07/26/2020  . Cerebrovascular accident (CVA) of right basal ganglia (HCC) 07/26/2020  . Left hemiparesis (HCC) 07/25/2020  . Hypokalemia 07/20/2020  . Right basal ganglia embolic stroke (HCC) 07/20/2020  .  Essential hypertension 07/20/2020  . Hypothyroidism 07/20/2020    Drake Leach, PT, DPT  10/26/2020, 3:31 PM  [facility] [address] [place], [place], [address] Phone: [phone]   Fax:  [phone]  Name: Nicole [name] MRN: [id number] Date of Birth: 09/12/1930

## 2020-10-31 NOTE — Progress Notes (Signed)
Kindly inform the patient her MRI scan of the brain shows expected changes of shrinkage in her recent stroke on the right side in the deep portion of the brain and mild age-related changes of hardening of the arteries.  No new or worrisome finding.

## 2020-11-01 ENCOUNTER — Telehealth: Payer: Self-pay | Admitting: Emergency Medicine

## 2020-11-01 ENCOUNTER — Encounter: Payer: Self-pay | Admitting: Neurology

## 2020-11-01 ENCOUNTER — Ambulatory Visit (INDEPENDENT_AMBULATORY_CARE_PROVIDER_SITE_OTHER): Payer: Medicare Other | Admitting: Neurology

## 2020-11-01 VITALS — BP 113/62 | HR 90 | Ht 64.0 in | Wt 150.0 lb

## 2020-11-01 DIAGNOSIS — I639 Cerebral infarction, unspecified: Secondary | ICD-10-CM | POA: Diagnosis not present

## 2020-11-01 DIAGNOSIS — G8194 Hemiplegia, unspecified affecting left nondominant side: Secondary | ICD-10-CM | POA: Diagnosis not present

## 2020-11-01 NOTE — Progress Notes (Signed)
[facility] [address] [place]. [place] [address] [phone]       OFFICE FOLLOW-UP NOTE  [name] Date of Birth:  May 14, 1930 Medical Record Number:  [id number]   HPI: [name] is a pleasant 86 year old African-American lady seen today for initial office follow-up visit following hospital consultation for stroke in September 2021.  History is obtained from the patient and daughter as well as review of electronic medical records and I personally reviewed pertinent imaging films in PACS.  She has past medical history of hyperlipidemia, hypertension, hypothyroidism who presented on 07/20/2020 to [facility] with sudden onset of left-sided weakness but presented outside the window for TPA.  NIH stroke scale was 8 on admission.  Stat CT scan of the head revealed no acute changes and CT angiogram of the head and neck showed only minor atherosclerotic changes but no large vessel stenosis or occlusion.  MRI scan of the brain shows a large 2 cm diffusion positive lesion in the right posterior corona radiata and right lentiform nucleus.  LDL cholesterol was 125 mg percent hemoglobin A1c was 5.4.  2D echo showed normal ejection fraction without cardiac source of embolism.  Cardiac telemetry monitoring did not show any atrial arrhythmias.  Qualified for an participate in the BMS stroke prevention trial and completed trial participation on 10/18/2020.  She was placed on Plavix alone as she had been on aspirin prior to admission.  Patient is currently living at home with her daughter.  She is getting outpatient physical occupational therapy.  She states her left upper extremity strength has improved but she is still has lack of confidence in significant leg weakness and is not able to walk even with the help of the therapist.  She is however making slow progress.  Her speech has improved.  She is tolerating Plavix well with only minor bruising.  Blood pressures well controlled  and today it is 113/62.  She is tolerating Lipitor well without muscle aches and pains.  She has no new complaints.  She had follow-up MRI scan of the brain done as per [facility] stroke study protocol and 10/19/2020 which showed expected evolutionary changes in the recent large right corona radiata infarct without any unexpected or new findings.  ROS:   14 system review of systems is positive for weakness, gait difficulty, bruising all other systems negative PMH:  Past Medical History:  Diagnosis Date  . Hypertension   . Stroke Nicole [name])    September 2021  . Thyroid disease     Social History:  Social History   Socioeconomic History  . Marital status: Single    Spouse name: Not on file  . Number of children: Not on file  . Years of education: Not on file  . Highest education level: Not on file  Occupational History  . Not on file  Tobacco Use  . Smoking status: Never Smoker  . Smokeless tobacco: Never Used  Vaping Use  . Vaping Use: Never used  Substance and Sexual Activity  . Alcohol use: No    Alcohol/week: 0.0 standard drinks  . Drug use: No  . Sexual activity: Not on file  Other Topics Concern  . Not on file  Social History Narrative   Lives with daughter   Right handed   Drinks 1-2 cups caffeine daily   Social Determinants of Health   Financial Resource Strain: Not on file  Food Insecurity: Not on file  Transportation Needs: Not on file  Physical  Activity: Not on file  Stress: Not on file  Social Connections: Not on file  Intimate Partner Violence: Not on file    Medications:   Current Outpatient Medications on File Prior to Visit  Medication Sig Dispense Refill  . acetaminophen (TYLENOL) 325 MG tablet Take 2 tablets (650 mg total) by mouth every 8 (eight) hours.    Marland Kitchen atorvastatin (LIPITOR) 80 MG tablet Take 1 tablet (80 mg total) by mouth daily. 45 tablet 1  . baclofen (LIORESAL) 10 MG tablet Take 0.5 tablets (5 mg total) by mouth 3 (three) times daily as needed  for muscle spasms (for muscle tightness/muscle pain). 45 each 5  . cetirizine (ZYRTEC) 10 MG tablet Take 1 tablet (10 mg total) by mouth daily as needed for allergies or rhinitis.    . cholecalciferol (VITAMIN D) 25 MCG tablet Take 1 tablet (1,000 Units total) by mouth daily. 30 tablet 0  . Cholecalciferol (VITAMIN D-3 PO) Take 1 capsule by mouth daily.    . clopidogrel (PLAVIX) 75 MG tablet Take 1 tablet (75 mg total) by mouth daily. 90 tablet 3  . diclofenac Sodium (VOLTAREN) 1 % GEL Apply 2 g topically 4 (four) times daily as needed (to affected areas- for pain). 2 g 0  . levothyroxine (SYNTHROID) 50 MCG tablet Take 1 tablet (50 mcg total) by mouth daily before breakfast. 30 tablet 0  . lisinopril (ZESTRIL) 20 MG tablet Take 1 tablet (20 mg total) by mouth daily. 30 tablet 0  . polyethylene glycol (MIRALAX / GLYCOLAX) 17 g packet Take 17 g by mouth daily as needed. 14 each 0  . PROAIR HFA 108 (90 Base) MCG/ACT inhaler Inhale 1 puff into the lungs every 4 (four) hours as needed for wheezing or shortness of breath. 8 g 0  . senna-docusate (SENOKOT-S) 8.6-50 MG tablet Take 2 tablets by mouth 2 (two) times daily.    . vitamin B-12 (CYANOCOBALAMIN) 250 MCG tablet Take 1 tablet (250 mcg total) by mouth daily. 30 tablet 0  . oxybutynin (DITROPAN-XL) 5 MG 24 hr tablet Take 1 tablet (5 mg total) by mouth at bedtime. 30 tablet 5   No current facility-administered medications on file prior to visit.    Allergies:  No Known Allergies  Physical Exam General: well developed, well nourished pleasant elderly African-American lady, seated, in no evident distress Head: head normocephalic and atraumatic.  Neck: supple with no carotid or supraclavicular bruits Cardiovascular: regular rate and rhythm, no murmurs Musculoskeletal: no deformity Skin:  no rash/petichiae Vascular:  Normal pulses all extremities Vitals:   11/01/20 1104  BP: 113/62  Pulse: 90   Neurologic Exam Mental Status: Awake and fully  alert. Oriented to place and time. Recent and remote memory intact. Attention span, concentration and fund of knowledge appropriate. Mood and affect appropriate.  Cranial Nerves: Fundoscopic exam reveals sharp disc margins. Pupils equal, briskly reactive to light. Extraocular movements full without nystagmus. Visual fields full to confrontation. Hearing intact. Facial sensation intact.  Mild left lower facial weakness., tongue, palate moves normally and symmetrically.  Motor: Left hemiparesis with subtle left upper extremity drift with significant weakness of left grip and intrinsic hand muscles and orbits right over left upper extremity.  Left lower extremity strength is 2-3/5 with drift.  Tone is increased slightly on the left compared to the right.  Right-sided strength is normal Sensory.: intact to touch ,pinprick .position and vibratory sensation.  Coordination: Rapid alternating movements normal in all extremities. Finger-to-nose and heel-to-shin performed accurately bilaterally.  Gait and Station: Patient is in a wheelchair and is unable to walk even with assistance. Reflexes: 1+ and asymmetric and brisker on the left. Toes downgoing.   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  She has completed participation in the BMS stroke prevention trial     PLAN: I had a long d/w patient and her daughter about his recent stroke, risk for recurrent stroke/TIAs, personally independently reviewed imaging studies and stroke evaluation results and answered questions.Continue Plavix 75 mg daily for secondary stroke prevention and maintain strict control of hypertension with blood pressure goal below 130/90, diabetes with hemoglobin A1c goal below 6.5% and lipids with LDL cholesterol goal below 70 mg/dL. I also advised the  patient to eat a healthy diet with plenty of whole grains, cereals, fruits and vegetables, exercise regularly and maintain ideal body weight.  Continue ongoing outpatient physical and occupational therapy and I encouraged the patient to walk with assistance and follow fall safety precautions.  Followup in the future with Shanda Bumps my nurse practitioner in 6 months or call earlier if necessary. Greater than 50% of time during this 25 minute visit was spent on counseling,explanation of diagnosis, planning of further management, discussion with patient and family and coordination of care Delia Heady, MD Note: This document was prepared with digital dictation and possible smart phrase technology. Any transcriptional errors that result from this process are unintentional

## 2020-11-01 NOTE — Telephone Encounter (Signed)
-----   Message from Micki Riley, MD sent at 10/31/2020  8:44 AM EST ----- Kindly inform the patient her MRI scan of the brain shows expected changes of shrinkage in her recent stroke on the right side in the deep portion of the brain and mild age-related changes of hardening of the arteries.  No new or worrisome finding.

## 2020-11-01 NOTE — Patient Instructions (Signed)
I had a long d/w patient  about his recent stroke, risk for recurrent stroke/TIAs, personally independently reviewed imaging studies and stroke evaluation results and answered questions.Continue Plavix 75 mg daily for secondary stroke prevention and maintain strict control of hypertension with blood pressure goal below 130/90, diabetes with hemoglobin A1c goal below 6.5% and lipids with LDL cholesterol goal below 70 mg/dL. I also advised the patient to eat a healthy diet with plenty of whole grains, cereals, fruits and vegetables, exercise regularly and maintain ideal body weight.  Continue ongoing outpatient physical and occupational therapy and I encouraged the patient to walk with assistance and follow fall safety precautions.  Followup in the future with Shanda Bumps my nurse practitioner in 6 months or call earlier if necessary.  Stroke Prevention Some medical conditions and behaviors are associated with a higher chance of having a stroke. You can help prevent a stroke by making nutrition, lifestyle, and other changes, including managing any medical conditions you may have. What nutrition changes can be made?   Eat healthy foods. You can do this by: ? Choosing foods high in fiber, such as fresh fruits and vegetables and whole grains. ? Eating at least 5 or more servings of fruits and vegetables a day. Try to fill half of your plate at each meal with fruits and vegetables. ? Choosing lean protein foods, such as lean cuts of meat, poultry without skin, fish, tofu, beans, and nuts. ? Eating low-fat dairy products. ? Avoiding foods that are high in salt (sodium). This can help lower blood pressure. ? Avoiding foods that have saturated fat, trans fat, and cholesterol. This can help prevent high cholesterol. ? Avoiding processed and premade foods.  Follow your health care provider's specific guidelines for losing weight, controlling high blood pressure (hypertension), lowering high cholesterol, and  managing diabetes. These may include: ? Reducing your daily calorie intake. ? Limiting your daily sodium intake to 1,500 milligrams (mg). ? Using only healthy fats for cooking, such as olive oil, canola oil, or sunflower oil. ? Counting your daily carbohydrate intake. What lifestyle changes can be made?  Maintain a healthy weight. Talk to your health care provider about your ideal weight.  Get at least 30 minutes of moderate physical activity at least 5 days a week. Moderate activity includes brisk walking, biking, and swimming.  Do not use any products that contain nicotine or tobacco, such as cigarettes and e-cigarettes. If you need help quitting, ask your health care provider. It may also be helpful to avoid exposure to secondhand smoke.  Limit alcohol intake to no more than 1 drink a day for nonpregnant women and 2 drinks a day for men. One drink equals 12 oz of beer, 5 oz of wine, or 1 oz of hard liquor.  Stop any illegal drug use.  Avoid taking birth control pills. Talk to your health care provider about the risks of taking birth control pills if: ? You are over 70 years old. ? You smoke. ? You get migraines. ? You have ever had a blood clot. What other changes can be made?  Manage your cholesterol levels. ? Eating a healthy diet is important for preventing high cholesterol. If cholesterol cannot be managed through diet alone, you may also need to take medicines. ? Take any prescribed medicines to control your cholesterol as told by your health care provider.  Manage your diabetes. ? Eating a healthy diet and exercising regularly are important parts of managing your blood sugar. If your blood sugar  cannot be managed through diet and exercise, you may need to take medicines. ? Take any prescribed medicines to control your diabetes as told by your health care provider.  Control your hypertension. ? To reduce your risk of stroke, try to keep your blood pressure below  130/80. ? Eating a healthy diet and exercising regularly are an important part of controlling your blood pressure. If your blood pressure cannot be managed through diet and exercise, you may need to take medicines. ? Take any prescribed medicines to control hypertension as told by your health care provider. ? Ask your health care provider if you should monitor your blood pressure at home. ? Have your blood pressure checked every year, even if your blood pressure is normal. Blood pressure increases with age and some medical conditions.  Get evaluated for sleep disorders (sleep apnea). Talk to your health care provider about getting a sleep evaluation if you snore a lot or have excessive sleepiness.  Take over-the-counter and prescription medicines only as told by your health care provider. Aspirin or blood thinners (antiplatelets or anticoagulants) may be recommended to reduce your risk of forming blood clots that can lead to stroke.  Make sure that any other medical conditions you have, such as atrial fibrillation or atherosclerosis, are managed. What are the warning signs of a stroke? The warning signs of a stroke can be easily remembered as BEFAST.  B is for balance. Signs include: ? Dizziness. ? Loss of balance or coordination. ? Sudden trouble walking.  E is for eyes. Signs include: ? A sudden change in vision. ? Trouble seeing.  F is for face. Signs include: ? Sudden weakness or numbness of the face. ? The face or eyelid drooping to one side.  A is for arms. Signs include: ? Sudden weakness or numbness of the arm, usually on one side of the body.  S is for speech. Signs include: ? Trouble speaking (aphasia). ? Trouble understanding.  T is for time. ? These symptoms may represent a serious problem that is an emergency. Do not wait to see if the symptoms will go away. Get medical help right away. Call your local emergency services (911 in the [place] Do not drive yourself to the  hospital.  Other signs of stroke may include: ? A sudden, severe headache with no known cause. ? Nausea or vomiting. ? Seizure. Where to find more information For more information, visit:  American Stroke Association: www.strokeassociation.org  National Stroke Association: www.stroke.org Summary  You can prevent a stroke by eating healthy, exercising, not smoking, limiting alcohol intake, and managing any medical conditions you may have.  Do not use any products that contain nicotine or tobacco, such as cigarettes and e-cigarettes. If you need help quitting, ask your health care provider. It may also be helpful to avoid exposure to secondhand smoke.  Remember BEFAST for warning signs of stroke. Get help right away if you or a loved one has any of these signs. This information is not intended to replace advice given to you by your health care provider. Make sure you discuss any questions you have with your health care provider. Document Revised: 10/04/2017 Document Reviewed: 11/27/2016 Elsevier Patient Education  2020 [employer].

## 2020-11-01 NOTE — Telephone Encounter (Signed)
Called and spoke to patient's daughter (on [place]), went over Dr. Marlis Edelson findings from MRI.  Denied any questions, stated she would be in today at 11 for follow up.  Expressed appreciation for the call.

## 2020-11-02 ENCOUNTER — Other Ambulatory Visit: Payer: Self-pay

## 2020-11-02 ENCOUNTER — Ambulatory Visit: Payer: Medicare Other | Admitting: Physical Therapy

## 2020-11-02 ENCOUNTER — Encounter: Payer: Self-pay | Admitting: Physical Therapy

## 2020-11-02 DIAGNOSIS — I69354 Hemiplegia and hemiparesis following cerebral infarction affecting left non-dominant side: Secondary | ICD-10-CM | POA: Diagnosis not present

## 2020-11-02 DIAGNOSIS — M170 Bilateral primary osteoarthritis of knee: Secondary | ICD-10-CM | POA: Diagnosis not present

## 2020-11-02 DIAGNOSIS — R2689 Other abnormalities of gait and mobility: Secondary | ICD-10-CM | POA: Diagnosis not present

## 2020-11-02 DIAGNOSIS — M6281 Muscle weakness (generalized): Secondary | ICD-10-CM

## 2020-11-02 DIAGNOSIS — R262 Difficulty in walking, not elsewhere classified: Secondary | ICD-10-CM | POA: Diagnosis not present

## 2020-11-02 DIAGNOSIS — R2681 Unsteadiness on feet: Secondary | ICD-10-CM | POA: Diagnosis not present

## 2020-11-02 NOTE — Therapy (Signed)
[facility] [address] [place], [place], [address] Phone: [phone]   Fax:  [phone]  Physical Therapy Treatment  Patient Details  Name: Nicole [name] MRN: [id number] Date of Birth: [birth year] Referring Provider (PT): Genice Rouge, MD   Encounter Date: 11/02/2020   PT End of Session - 11/02/20 0852    Visit Number 4    Number of Visits 17    Date for PT Re-Evaluation 01/10/21   POC for 8 weeks, Cert for 90 days   Authorization Type BCBS Medicare (10th Visit PN)    Progress Note Due on Visit 10    PT Start Time [phone]    PT Stop Time 0930    PT Time Calculation (min) 41 min    Equipment Utilized During Treatment Gait belt    Activity Tolerance Patient tolerated treatment well;No increased pain    Behavior During Therapy WFL for tasks assessed/performed           Past Medical History:  Diagnosis Date  . Hypertension   . Stroke [facility])    September 2021  . Thyroid disease     History reviewed. No pertinent surgical history.  There were no vitals filed for this visit.   Subjective Assessment - 11/02/20 0851    Subjective No new complaints. No falls. Denies any pain at this time.    Patient is accompained by: Family member    Pertinent History R Basal Ganglia CVA, Hyperlipidemia, Hypothyroidism, Asthma, Glaucoma, HTN    Limitations Standing;Walking    Patient Stated Goals Be able to walk;    Currently in Pain? No/denies                 [facility] Adult PT Treatment/Exercise - 11/02/20 0853      Transfers   Transfers Sit to Stand;Stand to Sit;Stand Pivot Transfers    Sit to Stand 4: Min guard;With upper extremity assist    Sit to Stand Details Tactile cues for weight shifting;Tactile cues for placement;Verbal cues for sequencing;Verbal cues for technique;Verbal cues for precautions/safety;Verbal cues for safe use of DME/AE;Manual facilitation for weight shifting    Sit to Stand Details (indicate cue  type and reason) cues to scoot closer to edge of surface, for hand placement, for LE placement and for weight shifting with standing.    Stand to Sit 4: Min guard;With upper extremity assist;To bed;To chair/3-in-1    Stand to Sit Details (indicate cue type and reason) Tactile cues for weight shifting;Verbal cues for sequencing;Verbal cues for technique;Verbal cues for precautions/safety;Verbal cues for safe use of DME/AE;Manual facilitation for weight shifting    Stand to Sit Details cues to reach back and use arms to control descent with assist needed for controlled sitting down.      Ambulation/Gait   Ambulation/Gait Yes    Ambulation/Gait Assistance 4: Min guard;4: Min assist    Ambulation/Gait Assistance Details assistance for walker mangement at times with cues for increased right step length. assist/facilitation for posture, weight shifting onto left LE and walker management. wheelchair follow however not needed.    Ambulation Distance (Feet) 115 Feet   x1   Assistive device Rolling walker    Gait Pattern Step-to pattern;Step-through pattern;Decreased step length - right;Decreased step length - left;Decreased stance time - left;Decreased hip/knee flexion - left;Decreased stride length;Decreased weight shift to left;Left flexed knee in stance;Antalgic;Trunk flexed;Narrow base of support    Ambulation Surface Level;Indoor    Stairs Yes    Stairs Assistance 3: Mod  assist;2: Max assist   of 2 for safety   Stairs Assistance Details (indicate cue type and reason) pt able to ascend 2 steps with single rail with mod assist, cues/facilitation for weight shifitng and technique with 2 person assist for safety. stopped after 2 steps due to assistance needed with max assist of 2 to descend the 2 steps with pt sitting on PTA knee in order to advance LE's down. pt with decreased tolerance to bearing weight on right LE (stronger leg) as well as left LE (weaker leg) with both ascending and descending steps.     Stair Management Technique One rail Left;Step to pattern;Sideways    Number of Stairs 2    Height of Stairs 6      Therapeutic Activites    Therapeutic Activities Other Therapeutic Activities    Other Therapeutic Activities standing at RW with AFO on left LE: working on lateral weight shifting, progressing to right LE fwd/bwd stepping for 5-6 steps, progressing to gait with RW. see gait comments.                 PT Short Term Goals - 10/12/20 0956      PT SHORT TERM GOAL #1   Title Patient will be independent with initial HEP with caregiver assistance (ALL STGS Due: 11/09/20)    Baseline No HEP established    Time 4    Period Weeks    Status New    Target Date 11/09/20      PT SHORT TERM GOAL #2   Title Patient will demo ability to compelte all bed mobility with CGA to demonstrate improved independence with functional mobility    Baseline Min - Mod A    Time 4    Period Weeks    Status New      PT SHORT TERM GOAL #3   Title Patient will demo ability to ambulate >= 30 ft with RW and CGA to demonstrate improved household ambulation    Baseline 5 ft    Time 4    Period Weeks    Status New      PT SHORT TERM GOAL #4   Title Patient will demo ability to complete sit <> stand with min A and RW to demonstrate improved functional mobility    Baseline Min - Mod A    Time 4    Period Weeks    Status New             PT Long Term Goals - 10/12/20 2202      PT LONG TERM GOAL #1   Title Patient will be independent with final HEP with caregiver assistance (ALL LTGs Due: 12/07/20)    Baseline no HEP established    Time 8    Period Weeks    Status New    Target Date 12/07/20      PT LONG TERM GOAL #2   Title Patient will be able to ambulate >/= 115 ft on indoor level surfaces with CGA and LRAD to demonstrate improved household mobility    Baseline 5 ft    Time 8    Period Weeks    Status New      PT LONG TERM GOAL #3   Title Patient will demo ability to complete sit  <> stand  and stand pivot transfer with CGA with RW to demosntrate improved independence with functional mobility    Baseline Min - Mod A    Time 8    Period  Weeks    Status New      PT LONG TERM GOAL #4   Title LTG to be set for TUG when able to assess    Baseline TBA    Time 8    Period Weeks    Status New                 Plan - 11/02/20 9381    Clinical Impression Statement Today's skilled session focused on standing and gait with RW while using clinic anterior Ottobock AFO with improved standing tolerance and gait distance. Pt has an AFO at home that they are to bring to her next appointment. Also began to address stair training as pt scoots on her buttocks. Pt needed mod to max assist of 2 people for 2 stairs. Advised to keep scooting a this time and PT will continue to work on strengthening to allow for improved stair negotiation. Pt and daughter verbalized understanding. The pt is progressing toward goals and should benefit from continued PT to progress toward unmet goals.    Personal Factors and Comorbidities Comorbidity 3+;Time since onset of injury/illness/exacerbation;Age    Comorbidities R Basal Ganglia CVA, Hyperlipidemia, Hypothyroidism, Asthma, Glaucoma, HTN    Examination-Activity Limitations Bed Mobility;Dressing;Locomotion Level;Reach Overhead;Stairs;Stand;Transfers    Examination-Participation Restrictions Psychiatric nurse Evolving/Moderate complexity    Rehab Potential Fair    PT Frequency 2x / week    PT Duration 8 weeks    PT Treatment/Interventions ADLs/Self Care Home Management;Cryotherapy;Electrical Stimulation;Moist Heat;DME Instruction;Gait training;Stair training;Functional mobility training;Therapeutic activities;Therapeutic exercise;Balance training;Neuromuscular re-education;Patient/family education;Orthotic Fit/Training;Manual techniques;Passive range of motion    PT Next Visit Plan continue to work on  LE strengthening. assess gait with pt's AFO when she brings it in, otherwise continue with use of anterior Ottobock brace. continue to work on standing posture and weight shifitng. How did gel injection into her left knee go?    PT Home Exercise Plan Access Code: [email or web address]    Consulted and Agree with Plan of Care Patient           Patient will benefit from skilled therapeutic intervention in order to improve the following deficits and impairments:  Abnormal gait,Decreased balance,Decreased mobility,Decreased endurance,Difficulty walking,Impaired tone,Impaired sensation,Pain,Decreased strength,Decreased safety awareness,Decreased knowledge of use of DME,Decreased coordination,Decreased activity tolerance,Decreased range of motion  Visit Diagnosis: Hemiplegia and hemiparesis following cerebral infarction affecting left non-dominant side (HCC)  Muscle weakness (generalized)  Unsteadiness on feet  Other abnormalities of gait and mobility  Difficulty in walking, not elsewhere classified     Problem List Patient Active Problem List   Diagnosis Date Noted  . Wheelchair dependence 10/07/2020  . Left knee pain 07/26/2020  . Cerebrovascular accident (CVA) of right basal ganglia (HCC) 07/26/2020  . Left hemiparesis (HCC) 07/25/2020  . Hypokalemia 07/20/2020  . Right basal ganglia embolic stroke (HCC) 07/20/2020  . Essential hypertension 07/20/2020  . Hypothyroidism 07/20/2020    Sallyanne Kuster, PTA, [facility] [address] [place], [place] [address] [phone] 11/03/20, 12:25 PM   Name: Nicole [name] MRN: [id number] Date of Birth: Mar 16, 1930

## 2020-11-09 ENCOUNTER — Ambulatory Visit: Payer: Medicare Other | Attending: Physical Medicine and Rehabilitation

## 2020-11-09 ENCOUNTER — Telehealth: Payer: Self-pay

## 2020-11-09 ENCOUNTER — Encounter: Payer: Self-pay | Admitting: Occupational Therapy

## 2020-11-09 ENCOUNTER — Ambulatory Visit: Payer: Medicare Other | Admitting: Occupational Therapy

## 2020-11-09 ENCOUNTER — Other Ambulatory Visit: Payer: Self-pay

## 2020-11-09 DIAGNOSIS — M25512 Pain in left shoulder: Secondary | ICD-10-CM | POA: Diagnosis not present

## 2020-11-09 DIAGNOSIS — R278 Other lack of coordination: Secondary | ICD-10-CM | POA: Diagnosis not present

## 2020-11-09 DIAGNOSIS — R2681 Unsteadiness on feet: Secondary | ICD-10-CM | POA: Insufficient documentation

## 2020-11-09 DIAGNOSIS — I69354 Hemiplegia and hemiparesis following cerebral infarction affecting left non-dominant side: Secondary | ICD-10-CM

## 2020-11-09 DIAGNOSIS — M6281 Muscle weakness (generalized): Secondary | ICD-10-CM

## 2020-11-09 DIAGNOSIS — R262 Difficulty in walking, not elsewhere classified: Secondary | ICD-10-CM | POA: Diagnosis not present

## 2020-11-09 DIAGNOSIS — M25612 Stiffness of left shoulder, not elsewhere classified: Secondary | ICD-10-CM

## 2020-11-09 DIAGNOSIS — M21372 Foot drop, left foot: Secondary | ICD-10-CM

## 2020-11-09 DIAGNOSIS — R2689 Other abnormalities of gait and mobility: Secondary | ICD-10-CM | POA: Insufficient documentation

## 2020-11-09 NOTE — Telephone Encounter (Signed)
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  Nicole [name] will benefit from use of L AFO in order to improve safety with functional mobility.   If you agree, please submit request in EPIC under MD Order, Other Orders (list L AFO in comments) or fax to [facility] at [phone].   Thank you, Adelfa Koh, PT, DPT   [facility] [address] [place], [place]  [address] Phone:  [phone] Fax:  [phone]

## 2020-11-09 NOTE — Therapy (Signed)
[facility] [address] [place], [place], [address] Phone: [phone]   Fax:  [phone]  Physical Therapy Treatment  Patient Details  Name: Nicole [name] MRN: [id number] Date of Birth: 03-09-1930 Referring Provider (PT): Genice Rouge, MD   Encounter Date: 11/09/2020   PT End of Session - 11/09/20 0930    Visit Number 5    Number of Visits 17    Date for PT Re-Evaluation 01/10/21   POC for 8 weeks, Cert for 90 days   Authorization Type BCBS Medicare (10th Visit PN)    Progress Note Due on Visit 10    PT Start Time 0930    PT Stop Time 1015    PT Time Calculation (min) 45 min    Equipment Utilized During Treatment Gait belt    Activity Tolerance Patient tolerated treatment well;No increased pain    Behavior During Therapy WFL for tasks assessed/performed           Past Medical History:  Diagnosis Date  . Hypertension   . Stroke [facility])    September 2021  . Thyroid disease     History reviewed. No pertinent surgical history.  There were no vitals filed for this visit.   Subjective Assessment - 11/09/20 0933    Subjective No new changes, did recieve gel injection into the knee. No falls. No pain.    Patient is accompained by: Family member    Pertinent History R Basal Ganglia CVA, Hyperlipidemia, Hypothyroidism, Asthma, Glaucoma, HTN    Limitations Standing;Walking    Patient Stated Goals Be able to walk;    Currently in Pain? No/denies                             [facility] Adult PT Treatment/Exercise - 11/09/20 1027      Transfers   Transfers Sit to Stand;Stand to [employer]    Sit to Stand 4: Min guard;With upper extremity assist    Sit to Stand Details Tactile cues for weight shifting;Tactile cues for placement;Verbal cues for sequencing;Verbal cues for technique;Verbal cues for precautions/safety;Verbal cues for safe use of DME/AE;Manual facilitation for weight shifting     Sit to Stand Details (indicate cue type and reason) completed sit <> stand from w/c, vebral cues for hand placement    Stand to Sit 4: Min guard;With upper extremity assist;To bed;To chair/3-in-1    Stand to Sit Details (indicate cue type and reason) Tactile cues for weight shifting;Verbal cues for sequencing;Verbal cues for technique;Verbal cues for precautions/safety;Verbal cues for safe use of DME/AE;Manual facilitation for weight shifting    Stand to Sit Details verbal cues for reaching back prior to descent      Ambulation/Gait   Ambulation/Gait Yes    Ambulation/Gait Assistance 4: Min guard;4: Min assist    Ambulation/Gait Assistance Details Patient brought in personal AFO (L Blue Rocker), PT donned and completed ambulation with AFO donned x 25 ft. Patient demonstrating decreased stability in LLE with knee buckling once requiring Min A during ambulation, decreased weight shift into LLE, step to pattern and increased IR noted leading to abnormal gait. PT donned L Anterior Ottobok to LLE, and ambulating 2 x 115 ft with improved gait pattern noted including step through pattern, improved weight shift on LLE during stance, and improved stability. Patietn verbalizing feeling more comfortable walking with Anterior Ottobok with ambulation.    Ambulation Distance (Feet) 25 Feet   x 1,  115 x 2   Assistive device Rolling walker    Gait Pattern Step-to pattern;Step-through pattern;Decreased step length - right;Decreased step length - left;Decreased stance time - left;Decreased hip/knee flexion - left;Decreased stride length;Decreased weight shift to left;Left flexed knee in stance;Antalgic;Trunk flexed;Narrow base of support    Ambulation Surface Level;Indoor    Gait Comments PT educating patient and daughter that will reach out to [facility] to determine next steps in process of obtaining new AFO. Will follow up at next visit with information.                  PT Education - 11/09/20 1034     Education Details process of obtaining new AFO    Person(s) Educated Patient;Child(ren)    Methods Explanation    Comprehension Verbalized understanding            PT Short Term Goals - 10/12/20 0956      PT SHORT TERM GOAL #1   Title Patient will be independent with initial HEP with caregiver assistance (ALL STGS Due: 11/09/20)    Baseline No HEP established    Time 4    Period Weeks    Status New    Target Date 11/09/20      PT SHORT TERM GOAL #2   Title Patient will demo ability to compelte all bed mobility with CGA to demonstrate improved independence with functional mobility    Baseline Min - Mod A    Time 4    Period Weeks    Status New      PT SHORT TERM GOAL #3   Title Patient will demo ability to ambulate >= 30 ft with RW and CGA to demonstrate improved household ambulation    Baseline 5 ft    Time 4    Period Weeks    Status New      PT SHORT TERM GOAL #4   Title Patient will demo ability to complete sit <> stand with min A and RW to demonstrate improved functional mobility    Baseline Min - Mod A    Time 4    Period Weeks    Status New             PT Long Term Goals - 10/12/20 4709      PT LONG TERM GOAL #1   Title Patient will be independent with final HEP with caregiver assistance (ALL LTGs Due: 12/07/20)    Baseline no HEP established    Time 8    Period Weeks    Status New    Target Date 12/07/20      PT LONG TERM GOAL #2   Title Patient will be able to ambulate >/= 115 ft on indoor level surfaces with CGA and LRAD to demonstrate improved household mobility    Baseline 5 ft    Time 8    Period Weeks    Status New      PT LONG TERM GOAL #3   Title Patient will demo ability to complete sit <> stand  and stand pivot transfer with CGA with RW to demosntrate improved independence with functional mobility    Baseline Min - Mod A    Time 8    Period Weeks    Status New      PT LONG TERM GOAL #4   Title LTG to be set for TUG when able to  assess    Baseline TBA    Time 8  Period Weeks    Status New                 Plan - 11/09/20 1034    Clinical Impression Statement Today's skilled PT session included continued gait training with patient's personal AFO (Blue Rocker) and clinic anterior ottobok AFO. Patient only able to ambulate short distance with Blue [place]er due to decreased stability and abnormal gait pattern, Min A required once due to knee buckling. PT donned clinic anterior ottobok AFO and demonstrating improved stability and gait pattern demonstrating ability to ambulate 2 x 115 ft. Will reach out to [facility] clinic regarding patient's personal AFO.    Personal Factors and Comorbidities Comorbidity 3+;Time since onset of injury/illness/exacerbation;Age    Comorbidities R Basal Ganglia CVA, Hyperlipidemia, Hypothyroidism, Asthma, Glaucoma, HTN    Examination-Activity Limitations Bed Mobility;Dressing;Locomotion Level;Reach Overhead;Stairs;Stand;Transfers    Examination-Participation Restrictions Psychiatric nurse Evolving/Moderate complexity    Rehab Potential Fair    PT Frequency 2x / week    PT Duration 8 weeks    PT Treatment/Interventions ADLs/Self Care Home Management;Cryotherapy;Electrical Stimulation;Moist Heat;DME Instruction;Gait training;Stair training;Functional mobility training;Therapeutic activities;Therapeutic exercise;Balance training;Neuromuscular re-education;Patient/family education;Orthotic Fit/Training;Manual techniques;Passive range of motion    PT Next Visit Plan continue to work on LE strengthening.  continue with use of anterior Ottobock brace. continue to work on standing posture and weight shifitng. update from [facility]?    PT Home Exercise Plan Access Code: [email or web address]    Consulted and Agree with Plan of Care Patient           Patient will benefit from skilled therapeutic intervention in order to improve the following deficits and  impairments:  Abnormal gait,Decreased balance,Decreased mobility,Decreased endurance,Difficulty walking,Impaired tone,Impaired sensation,Pain,Decreased strength,Decreased safety awareness,Decreased knowledge of use of DME,Decreased coordination,Decreased activity tolerance,Decreased range of motion  Visit Diagnosis: Difficulty in walking, not elsewhere classified  Muscle weakness (generalized)  Unsteadiness on feet  Other abnormalities of gait and mobility     Problem List Patient Active Problem List   Diagnosis Date Noted  . Wheelchair dependence 10/07/2020  . Left knee pain 07/26/2020  . Cerebrovascular accident (CVA) of right basal ganglia (HCC) 07/26/2020  . Left hemiparesis (HCC) 07/25/2020  . Hypokalemia 07/20/2020  . Right basal ganglia embolic stroke (HCC) 07/20/2020  . Essential hypertension 07/20/2020  . Hypothyroidism 07/20/2020    Tempie Donning, PT, DPT 11/09/2020, 10:37 AM  [facility] [address] [place], [place], [address] Phone: [phone]   Fax:  [phone]  Name: Nicole [name] MRN: [id number] Date of Birth: Mar 17, 1930

## 2020-11-09 NOTE — Therapy (Signed)
[facility] [address] [place], [place], [address] Phone: [phone]   Fax:  [phone]  Occupational Therapy Evaluation  Patient Details  Name: Nicole [name] MRN: [id number] Date of Birth: 1930/02/12 Referring Provider (OT): Genice Rouge, MD   Encounter Date: 11/09/2020   OT End of Session - 11/09/20 1002    Visit Number 1    Number of Visits 17    Date for OT Re-Evaluation 01/04/21    Authorization Type BCBS Medicare    Authorization Time Period $40 Copay VL:MN No Auth required  10th visit PN    Authorization - Visit Number 1    Authorization - Number of Visits 10    Progress Note Due on Visit 10    OT Start Time 1016    OT Stop Time 1100    OT Time Calculation (min) 44 min    Activity Tolerance Patient tolerated treatment well    Behavior During Therapy [name] for tasks assessed/performed           Past Medical History:  Diagnosis Date  . Hypertension   . Stroke [facility])    September 2021  . Thyroid disease     History reviewed. No pertinent surgical history.  There were no vitals filed for this visit.   Subjective Assessment - 11/09/20 1000    Subjective  Pt is an 87 year old female that presents  to Neuro OPOT s/p CVA 07/20/2020 and [facility] [place] therapy services. Pt reports wanting her LUE to work better and says "I can't do what I used to do". Pts daughter was present at first and reported that pt was independent with all ADLs and IADLs prior to CVA. Pt currently lives with her daughter and daughter's family and was living there prior to CVA.    Patient is accompanied by: Family member   daughter, Nicole [name] and grandson   Pertinent History CVA R basal ganglia, HTN, HLD, hypothyroidism, asthma, glaucoma    Limitations fall risk. hard of hearing    Currently in Pain? Yes    Pain Score 6     Pain Location Shoulder    Pain Orientation Left    Pain Descriptors / Indicators Aching    Pain Type Acute pain    Pain Onset  More than a month ago    Pain Frequency Intermittent   night time            [facility] OT Assessment - 11/09/20 1021      Assessment   Medical Diagnosis R Basal Ganglia CVA    Referring Provider (OT) Genice Rouge, MD    Onset Date/Surgical Date 07/20/20    Hand Dominance Right    Prior Therapy [facility] Therapy      Precautions   Precautions Fall      Home  Environment   Family/patient expects to be discharged to: Private residence    Living Arrangements Children    Type of Home House    Home Layout Two level   bed and bath upstairs   Bathroom Shower/Tub Tub/Shower unit    Bathroom Accessibility Yes      Prior Function   Level of Independence Independent    Vocation Retired    Gaffer domestic work in private homes      ADL   Eating/Feeding Needs assist with cutting food    Grooming Modified independent    Upper Body Bathing Minimal assistance   assistance for drying body   Lower Body  Bathing Minimal assistance   receives assistance for backside and drying   Upper Body Dressing Increased time;Minimal assistance   receives help with bra   Lower Body Dressing Increased time;Minimal assistance   some assistance for left shoe   Toilet Transfer Modified independent   daughter reports assisting some when pt is tired   Toileting - Architect Modified independent    Toileting -  Hygiene Increase time;Modified Independent    Tub/Shower Transfer Min guard      IADL   Prior Level of Function Shopping Independent    Shopping Needs to be accompanied on any shopping trip;Completely unable to shop    Prior Level of Function Light Housekeeping Independent    Light Housekeeping Needs help with all home maintenance tasks;Does not participate in any housekeeping tasks    Prior Level of Function Meal Prep Independent    Meal Prep Needs to have meals prepared and served    Prior Level of Function Community Mobility Drove previously    [employer] on family  or friends for transportation    Prior Level of Function Medication Managment Independent    Medication Management Takes responsibility if medication is prepared in advance in seperate dosage    Prior Level of Function Financial Management Independent    Financial Management Dependent      Mobility   Mobility Status Comments pt arrived in wheelchair. Pt reports does not amublate in home only propels w/c with feet around house      Written Expression   Dominant Hand Right      Vision - History   Baseline Vision Wears glasses only for reading    Additional Comments hx of cataracts      Observation/Other Assessments   Focus on Therapeutic Outcomes (FOTO)  12%      Sensation   Light Touch Appears Intact   BUE   Hot/Cold Appears Intact      Coordination   9 Hole Peg Test Right;Left    Right 9 Hole Peg Test 27.47    Left 9 Hole Peg Test 61.87    Box and Blocks R 48 L 29      Tone   Assessment Location Left Upper Extremity      ROM / Strength   AROM / PROM / Strength AROM      AROM   Overall AROM  Deficits    Overall AROM Comments deficts with LUE; RUE is [facility]      Strength   Overall Strength Deficits      Hand Function   Right Hand Grip (lbs) 42.1    Left Hand Grip (lbs) 26.8      LUE Tone   LUE Tone Modified Ashworth      LUE Tone   Modified Ashworth Scale for Grading Hypertonia LUE Slight increase in muscle tone, manifested by a catch and release or by minimal resistance at the end of the range of motion when the affected part(s) is moved in flexion or extension                           OT Education - 11/09/20 1002    Education Details Education provided on role and purpose of OT. Issued self PROM exercises.    Person(s) Educated Patient;Spouse    Methods Explanation;Demonstration    Comprehension Verbalized understanding;Returned demonstration            OT Short Term Goals - 11/09/20  1538      OT SHORT TERM GOAL #1   Title Pt will be  independent with HEP (12/07/20)    Time 4    Period Weeks    Status New    Target Date 12/07/20      OT SHORT TERM GOAL #2   Title Pt will improve grip strength in LUE by 3 lbs or greater for increase in functional use of LUE and increase independence with clothing management.    Baseline RUE 42.1 LUE 26.8    Time 4    Period Weeks    Status New      OT SHORT TERM GOAL #3   Title Pt will obtain item from mid level shelf with increase in shoulder flexion to 70 degrees with good positioning and reduction in shoulder hiking and compensatory movements.    Baseline LUE 66 degrees sh flexion    Time 4    Period Weeks    Status New      OT SHORT TERM GOAL #4   Title Pt will demonstrate improved box and blocks score to 35 or greater with LUE for increase in functional use of LUE.    Baseline 29 LUE    Time 4    Period Weeks    Status New      OT SHORT TERM GOAL #5   Title Pt will verbalize understanding of sleep positions for LUE to decrease pain.    Baseline 6/10 LUE shoulder pain at night    Time 4    Period Weeks    Status New      OT SHORT TERM GOAL #6   Title Pt will don bra with mod I    Baseline daughter currently assisting    Time 4    Period Weeks    Status New      OT SHORT TERM GOAL #7   Title --    Time 4    Period Weeks    Status New             OT Long Term Goals - 11/09/20 1604      OT LONG TERM GOAL #1   Title Pt will be independent with updated HEP 01/04/2021    Time 8    Period Weeks    Status New    Target Date 01/04/21      OT LONG TERM GOAL #2   Title Pt will demonstrate increased grip strength in LUE to 33 lbs or greater for improved functional use.    Baseline RUE 42.1 LUE 26.8    Time 8    Period Weeks    Status New      OT LONG TERM GOAL #3   Title Pt will perform UB and LB dressing with mod I.    Time 8    Period Weeks    Status New      OT LONG TERM GOAL #4   Title Pt will increase fine motor coordination by completing 9 hole  peg test in 50 seconds or less with LUE.    Baseline RUE 27.47 LUE 61.87    Time 8    Period Weeks    Status New      OT LONG TERM GOAL #5   Title Pt will improve shoulder flexion to at least 100 degrees in order to obtain object and increase functional reach.    Baseline LUE 66 degrees sh flexion    Time 8  Period Weeks    Status New      OT LONG TERM GOAL #6   Title Pt will report decrease in pain in LUE shoulder at night to less than or equal to 4/10 with improved sleep positioning and range of motion    Time 8    Period Weeks    Status New                 Plan - 11/09/20 1017    Clinical Impression Statement Pt is an 87 year old female that presents to Neuro OPOT s/p R basal ganglia CVA on 07/20/20 resulting in left sided weakness in UE and LE. Pt has PMH significant for HTN, HLD, asthma, hypothyroidism and cataracts.    OT Occupational Profile and History Problem Focused Assessment - Including review of records relating to presenting problem    Occupational performance deficits (Please refer to evaluation for details): ADL's;IADL's    Body Structure / Function / Physical Skills ADL;Balance;Coordination;IADL;Pain;GMC;Hearing;Strength;Tone;Sensation;Dexterity;Decreased knowledge of use of DME;FMC;UE functional use;ROM;Flexibility    Cognitive Skills Attention;Sequencing;Problem Solve    Rehab Potential Good    Clinical Decision Making Limited treatment options, no task modification necessary    Comorbidities Affecting Occupational Performance: None    Modification or Assistance to Complete Evaluation  No modification of tasks or assist necessary to complete eval    OT Frequency 2x / week    OT Duration 8 weeks   ot 16 visits + eval over extended period d/t scheduling.   OT Treatment/Interventions Self-care/ADL training;Cryotherapy;Ultrasound;Moist Heat;Electrical Stimulation;Paraffin;Energy conservation;DME and/or AE instruction;Manual Therapy;Functional Mobility  Training;Neuromuscular education;Therapeutic exercise;Visual/perceptual remediation/compensation;Patient/family education;Balance training;Therapeutic activities;Passive range of motion;Cognitive remediation/compensation    Plan Coordination HEP, Supine shoulder exercises HEP    OT Home Exercise Plan table slides, self PROM in sitting    Recommended Other Services Pt receives PT at this site.    Consulted and Agree with Plan of Care Family member/caregiver;Patient    Family Member Consulted daughter, Brunetta Genera, and grandson           Patient will benefit from skilled therapeutic intervention in order to improve the following deficits and impairments:   Body Structure / Function / Physical Skills: ADL,Balance,Coordination,IADL,Pain,GMC,Hearing,Strength,Tone,Sensation,Dexterity,Decreased knowledge of use of DME,FMC,UE functional use,ROM,Flexibility Cognitive Skills: Attention,Sequencing,Problem Solve     Visit Diagnosis: Muscle weakness (generalized) - Plan: Ot plan of care cert/re-cert  Other lack of coordination - Plan: Ot plan of care cert/re-cert  Unsteadiness on feet - Plan: Ot plan of care cert/re-cert  Hemiplegia and hemiparesis following cerebral infarction affecting left non-dominant side ([place]) - Plan: Ot plan of care cert/re-cert  Stiffness of left shoulder, not elsewhere classified - Plan: Ot plan of care cert/re-cert  Acute pain of left shoulder - Plan: Ot plan of care cert/re-cert    Problem List Patient Active Problem List   Diagnosis Date Noted  . Wheelchair dependence 10/07/2020  . Left knee pain 07/26/2020  . Cerebrovascular accident (CVA) of right basal ganglia ([place]) 07/26/2020  . Left hemiparesis ([place]) 07/25/2020  . Hypokalemia 07/20/2020  . Right basal ganglia embolic stroke ([place]) 38/08/1750  . Essential hypertension 07/20/2020  . Hypothyroidism 07/20/2020    Zachery Conch MOT, OTR/L  11/09/2020, 4:18 PM  [facility] [address] [place], [place], [address] Phone: [phone]   Fax:  [phone]  Name: GRESIA ISIDORO MRN: [id number] Date of Birth: 08/12/1930

## 2020-11-09 NOTE — Patient Instructions (Signed)
   Self Passive Range of Motion   In sitting, clasp hands together and bend down towards toes for a stretch for your shoulder. Repeat 10 times and do 2-3 times a day.  SHOULDER: Flexion On Table    Place hands on table, elbows straight. Move hips away from body. Press hands down into table. Hold _10-15_ seconds. 10_ reps per set, _3__ sets per day, 5-7___ days per week   Copyright  VHI. All rights reserved.

## 2020-11-11 ENCOUNTER — Other Ambulatory Visit: Payer: Self-pay

## 2020-11-11 ENCOUNTER — Encounter: Payer: Self-pay | Admitting: Occupational Therapy

## 2020-11-11 ENCOUNTER — Ambulatory Visit: Payer: Medicare Other | Admitting: Occupational Therapy

## 2020-11-11 ENCOUNTER — Ambulatory Visit: Payer: Medicare Other

## 2020-11-11 DIAGNOSIS — R262 Difficulty in walking, not elsewhere classified: Secondary | ICD-10-CM

## 2020-11-11 DIAGNOSIS — R278 Other lack of coordination: Secondary | ICD-10-CM

## 2020-11-11 DIAGNOSIS — R2681 Unsteadiness on feet: Secondary | ICD-10-CM

## 2020-11-11 DIAGNOSIS — R2689 Other abnormalities of gait and mobility: Secondary | ICD-10-CM | POA: Diagnosis not present

## 2020-11-11 DIAGNOSIS — M6281 Muscle weakness (generalized): Secondary | ICD-10-CM | POA: Diagnosis not present

## 2020-11-11 DIAGNOSIS — I69354 Hemiplegia and hemiparesis following cerebral infarction affecting left non-dominant side: Secondary | ICD-10-CM | POA: Diagnosis not present

## 2020-11-11 DIAGNOSIS — M25512 Pain in left shoulder: Secondary | ICD-10-CM | POA: Diagnosis not present

## 2020-11-11 DIAGNOSIS — M25612 Stiffness of left shoulder, not elsewhere classified: Secondary | ICD-10-CM

## 2020-11-11 NOTE — Therapy (Signed)
[facility] [address] [place], [place], [address] Phone: [phone]   Fax:  [phone]  Physical Therapy Treatment  Patient Details  Name: Nicole [name] MRN: [id number] Date of Birth: 04-Oct-1930 Referring Provider (PT): Courtney Heys, MD   Encounter Date: 11/11/2020   PT End of Session - 11/11/20 1013    Visit Number 6    Number of Visits 17    Date for PT Re-Evaluation 01/10/21   POC for 8 weeks, Cert for 90 days   Authorization Type BCBS Medicare (10th Visit PN)    Progress Note Due on Visit 10    PT Start Time 1015    PT Stop Time 1100    PT Time Calculation (min) 45 min    Equipment Utilized During Treatment Gait belt    Activity Tolerance Patient tolerated treatment well;No increased pain    Behavior During Therapy WFL for tasks assessed/performed           Past Medical History:  Diagnosis Date  . Hypertension   . Stroke [facility])    September 2021  . Thyroid disease     History reviewed. No pertinent surgical history.  There were no vitals filed for this visit.   Subjective Assessment - 11/11/20 1017    Subjective Patient reports no new changes, and no falls to report.    Patient is accompained by: Family member    Pertinent History R Basal Ganglia CVA, Hyperlipidemia, Hypothyroidism, Asthma, Glaucoma, HTN    Limitations Standing;Walking    Patient Stated Goals Be able to walk;    Currently in Pain? No/denies               [facility] Adult PT Treatment/Exercise - 11/11/20 0001      Transfers   Transfers Sit to Stand;Stand to Sit;Stand Pivot Transfers    Sit to Stand 4: Min guard;With upper extremity assist    Sit to Stand Details Tactile cues for weight shifting;Tactile cues for placement;Verbal cues for sequencing;Verbal cues for technique;Verbal cues for precautions/safety;Verbal cues for safe use of DME/AE;Manual facilitation for weight shifting    Sit to Stand Details (indicate cue type and  reason) verbal cues for hand placement    Stand to Sit 4: Min guard;With upper extremity assist;To bed;To chair/3-in-1    Stand to Sit Details (indicate cue type and reason) Tactile cues for weight shifting;Verbal cues for sequencing;Verbal cues for technique;Verbal cues for precautions/safety;Verbal cues for safe use of DME/AE;Manual facilitation for weight shifting    Stand to Sit Details vebral cues for controlled descent and to reach back prior to descent    Number of Reps Other reps (comment);Other sets (comment)   3 sets, 5 reps   Comments from elevated mat: completed sit <> stand training with LUE placed on RW and RUE pushing from mat table. verbal cues for improved forward lean. manual faciliation provided for improved forward lean and weight shift to LLE with completion. Pt frequently standing with knees internally rotated and adducted. PT placed red theraband around thighs and providing cueing for hip abduction against band for improved posture upon standing.      Ambulation/Gait   Ambulation/Gait Yes    Ambulation/Gait Assistance 4: Min guard    Ambulation/Gait Assistance Details Completed ambulation with RW and L Anterior Ottobok donned x 115 ft. PT providing CGA to Min A at times for balance. Increased stiffness noted in LLE but patient able to improve with verbal cues for improved posture. w/c follow  with ambulation.    Ambulation Distance (Feet) 115 Feet    Assistive device Rolling walker    Gait Pattern Step-to pattern;Step-through pattern;Decreased step length - right;Decreased step length - left;Decreased stance time - left;Decreased hip/knee flexion - left;Decreased stride length;Decreased weight shift to left;Left flexed knee in stance;Antalgic;Trunk flexed;Narrow base of support    Ambulation Surface Level;Indoor    Gait Comments PT educating patient that Thayer Ohm from [facility] will be present at appointment on 1/14 for further AFO assesment. PT educating patient to encourage  family member to be present for appointment if possible.      Neuro Re-ed    Neuro Re-ed Details  Completed standing activity with RW with mirror placed in front for visual feedback working on improved posture and alignment upon standing, completed standing 2 x 1  minute with PT working toward improved alignment. Patient reporting that she was "knock knee" when younger. Completed standing activity working toward stepping forward/backward with RLE to promote improved weight shift to LLE, completed x 10 reps. PT providing manual faciliation for improved weight shift.                  PT Education - 11/11/20 1102    Education Details Educated that [place] from [place] will be present at appt on 1/14 for AFO assessment    Person(s) Educated Patient    Methods Explanation    Comprehension Verbalized understanding            PT Short Term Goals - 10/12/20 0956      PT SHORT TERM GOAL #1   Title Patient will be independent with initial HEP with caregiver assistance (ALL STGS Due: 11/09/20)    Baseline No HEP established    Time 4    Period Weeks    Status New    Target Date 11/09/20      PT SHORT TERM GOAL #2   Title Patient will demo ability to compelte all bed mobility with CGA to demonstrate improved independence with functional mobility    Baseline Min - Mod A    Time 4    Period Weeks    Status New      PT SHORT TERM GOAL #3   Title Patient will demo ability to ambulate >= 30 ft with RW and CGA to demonstrate improved household ambulation    Baseline 5 ft    Time 4    Period Weeks    Status New      PT SHORT TERM GOAL #4   Title Patient will demo ability to complete sit <> stand with min A and RW to demonstrate improved functional mobility    Baseline Min - Mod A    Time 4    Period Weeks    Status New             PT Long Term Goals - 10/12/20 3875      PT LONG TERM GOAL #1   Title Patient will be independent with final HEP with caregiver assistance (ALL LTGs  Due: 12/07/20)    Baseline no HEP established    Time 8    Period Weeks    Status New    Target Date 12/07/20      PT LONG TERM GOAL #2   Title Patient will be able to ambulate >/= 115 ft on indoor level surfaces with CGA and LRAD to demonstrate improved household mobility    Baseline 5 ft    Time 8  Period Weeks    Status New      PT LONG TERM GOAL #3   Title Patient will demo ability to complete sit <> stand  and stand pivot transfer with CGA with RW to demosntrate improved independence with functional mobility    Baseline Min - Mod A    Time 8    Period Weeks    Status New      PT LONG TERM GOAL #4   Title LTG to be set for TUG when able to assess    Baseline TBA    Time 8    Period Weeks    Status New                 Plan - 11/11/20 1106    Clinical Impression Statement Continued gait trainign with L Anteiror Ottobok AFO donned during session, completing ambulation x 115 ft. Rest of session spent on improved alignment with sit <> stand training and standing tolerance. Pt continue to demo internal rotation and adduction of lower extremity leading to impaired alignment. Will continue to progress toward all LTG and mazimize functional mobility.    Personal Factors and Comorbidities Comorbidity 3+;Time since onset of injury/illness/exacerbation;Age    Comorbidities R Basal Ganglia CVA, Hyperlipidemia, Hypothyroidism, Asthma, Glaucoma, HTN    Examination-Activity Limitations Bed Mobility;Dressing;Locomotion Level;Reach Overhead;Stairs;Stand;Transfers    Examination-Participation Restrictions Psychiatric nurse Evolving/Moderate complexity    Rehab Potential Fair    PT Frequency 2x / week    PT Duration 8 weeks    PT Treatment/Interventions ADLs/Self Care Home Management;Cryotherapy;Electrical Stimulation;Moist Heat;DME Instruction;Gait training;Stair training;Functional mobility training;Therapeutic activities;Therapeutic  exercise;Balance training;Neuromuscular re-education;Patient/family education;Orthotic Fit/Training;Manual techniques;Passive range of motion    PT Next Visit Plan continue to work on LE strengthening.  continue with use of anterior Ottobock brace. Thayer Ohm from [place] will be present on 1/14 appt. Continue to work on standing posture and weight shifitng.    PT Home Exercise Plan Access Code: [email or web address]    Consulted and Agree with Plan of Care Patient           Patient will benefit from skilled therapeutic intervention in order to improve the following deficits and impairments:  Abnormal gait,Decreased balance,Decreased mobility,Decreased endurance,Difficulty walking,Impaired tone,Impaired sensation,Pain,Decreased strength,Decreased safety awareness,Decreased knowledge of use of DME,Decreased coordination,Decreased activity tolerance,Decreased range of motion  Visit Diagnosis: Muscle weakness (generalized)  Difficulty in walking, not elsewhere classified  Unsteadiness on feet  Other abnormalities of gait and mobility     Problem List Patient Active Problem List   Diagnosis Date Noted  . Wheelchair dependence 10/07/2020  . Left knee pain 07/26/2020  . Cerebrovascular accident (CVA) of right basal ganglia ([facility]) 07/26/2020  . Left hemiparesis ([facility]) 07/25/2020  . Hypokalemia 07/20/2020  . Right basal ganglia embolic stroke ([facility]) 07/20/2020  . Essential hypertension 07/20/2020  . Hypothyroidism 07/20/2020    Tempie Donning, PT, DPT 11/11/2020, 11:14 AM  [facility] [address] [place], [place], [address] Phone: [phone]   Fax:  [phone]  Name: Nicole [name] MRN: [id number] Date of Birth: 1930-08-26

## 2020-11-11 NOTE — Therapy (Signed)
[facility] [address] [place], [place], [address] Phone: [phone]   Fax:  [phone]  Occupational Therapy Treatment  Patient Details  Name: Nicole [name] MRN: [id number] Date of Birth: July 29, 1930 Referring Provider (OT): Courtney Heys, MD   Encounter Date: 11/11/2020   OT End of Session - 11/11/20 1104    Visit Number 2    Number of Visits 17    Date for OT Re-Evaluation 01/04/21    Authorization Type BCBS Medicare    Authorization Time Period $40 Copay VL:MN No Auth required  10th visit PN    Authorization - Visit Number 2    Authorization - Number of Visits 10    Progress Note Due on Visit 10    OT Start Time 1102    OT Stop Time 1143    OT Time Calculation (min) 41 min    Activity Tolerance Patient tolerated treatment well    Behavior During Therapy [facility] for tasks assessed/performed           Past Medical History:  Diagnosis Date  . Hypertension   . Stroke [facility])    September 2021  . Thyroid disease     History reviewed. No pertinent surgical history.  There were no vitals filed for this visit.   Subjective Assessment - 11/11/20 1104    Subjective  Pt denies any pain. Pt says "I'm not doing that good" when asked about walking with PT    Patient is accompanied by: Family member   daughter, Brunetta Genera and grandson   Pertinent History CVA R basal ganglia, HTN, HLD, hypothyroidism, asthma, glaucoma    Limitations fall risk. hard of hearing    Currently in Pain? No/denies                        OT Treatments/Exercises (OP) - 11/11/20 1113      Transfers   Comments pt transferred from edge of mat to wheelchair with mod A this day for stand pivot. Pt required verbal and tactile cues for transfer      Exercises   Exercises Hand      Neurological Re-education Exercises   Other Exercises 1 x 10 shoulder flexion and chest press with foam roller while in supine. Pt with fatigue and  required max tactile cueing for shoulder positioning. Pt demonstrted max guarding and shouler hiking with LUE during active movement. Worked on [employer] for LUE for tactile cueing and reducing resistance for normal movement patterns for LUE shoulder flexion, abduction and horizontal abduction.      Fine Motor Coordination (Hand/Wrist)   Fine Motor Coordination Manipulation of small objects    Manipulation of small objects semi circle pegboard - pt able to manipulate all sizes with little difficulty and drops however extreme compensation of limited shoulder flexion and movement with lateral flexion and shoulder hiking. Pt with max guarding of shoulder and limited any range of motion during active tasks                    OT Short Term Goals - 11/11/20 1152      OT SHORT TERM GOAL #1   Title Pt will be independent with HEP (12/07/20)    Time 4    Period Weeks    Status On-going    Target Date 12/07/20      OT SHORT TERM GOAL #2   Title Pt will improve grip strength in LUE by 3  lbs or greater for increase in functional use of LUE and increase independence with clothing management.    Baseline RUE 42.1 LUE 26.8    Time 4    Period Weeks    Status New      OT SHORT TERM GOAL #3   Title Pt will obtain item from mid level shelf with increase in shoulder flexion to 70 degrees with good positioning and reduction in shoulder hiking and compensatory movements.    Baseline LUE 66 degrees sh flexion    Time 4    Period Weeks    Status New      OT SHORT TERM GOAL #4   Title Pt will demonstrate improved box and blocks score to 35 or greater with LUE for increase in functional use of LUE.    Baseline 29 LUE    Time 4    Period Weeks    Status New      OT SHORT TERM GOAL #5   Title Pt will verbalize understanding of sleep positions for LUE to decrease pain.    Baseline 6/10 LUE shoulder pain at night    Time 4    Period Weeks    Status New      OT SHORT TERM GOAL #6   Title Pt will  don bra with mod I    Baseline daughter currently assisting    Time 4    Period Weeks    Status New      OT SHORT TERM GOAL #7   Time 4    Period Weeks    Status New             OT Long Term Goals - 11/09/20 1604      OT LONG TERM GOAL #1   Title Pt will be independent with updated HEP 01/04/2021    Time 8    Period Weeks    Status New    Target Date 01/04/21      OT LONG TERM GOAL #2   Title Pt will demonstrate increased grip strength in LUE to 33 lbs or greater for improved functional use.    Baseline RUE 42.1 LUE 26.8    Time 8    Period Weeks    Status New      OT LONG TERM GOAL #3   Title Pt will perform UB and LB dressing with mod I.    Time 8    Period Weeks    Status New      OT LONG TERM GOAL #4   Title Pt will increase fine motor coordination by completing 9 hole peg test in 50 seconds or less with LUE.    Baseline RUE 27.47 LUE 61.87    Time 8    Period Weeks    Status New      OT LONG TERM GOAL #5   Title Pt will improve shoulder flexion to at least 100 degrees in order to obtain object and increase functional reach.    Baseline LUE 66 degrees sh flexion    Time 8    Period Weeks    Status New      OT LONG TERM GOAL #6   Title Pt will report decrease in pain in LUE shoulder at night to less than or equal to 4/10 with improved sleep positioning and range of motion    Time 8    Period Weeks    Status New  Plan - 11/11/20 1157    Clinical Impression Statement Pt is progressing towards goals. Pt continues to demonstrate deficits with LUE shoulder impeding overall functional use of LUE.    OT Occupational Profile and History Problem Focused Assessment - Including review of records relating to presenting problem    Occupational performance deficits (Please refer to evaluation for details): ADL's;IADL's    Body Structure / Function / Physical Skills  ADL;Balance;Coordination;IADL;Pain;GMC;Hearing;Strength;Tone;Sensation;Dexterity;Decreased knowledge of use of DME;FMC;UE functional use;ROM;Flexibility    Cognitive Skills Attention;Sequencing;Problem Solve    Rehab Potential Good    Clinical Decision Making Limited treatment options, no task modification necessary    Comorbidities Affecting Occupational Performance: None    Modification or Assistance to Complete Evaluation  No modification of tasks or assist necessary to complete eval    OT Frequency 2x / week    OT Duration 8 weeks   ot 16 visits + eval over extended period d/t scheduling.   OT Treatment/Interventions Self-care/ADL training;Cryotherapy;Ultrasound;Moist Heat;Electrical Stimulation;Paraffin;Energy conservation;DME and/or AE instruction;Manual Therapy;Functional Mobility Training;Neuromuscular education;Therapeutic exercise;Visual/perceptual remediation/compensation;Patient/family education;Balance training;Therapeutic activities;Passive range of motion;Cognitive remediation/compensation    Plan Supine shoulder exercises HEP (family not present last session)    OT Home Exercise Plan table slides, self PROM in sitting    Recommended Other Services Pt receives PT at this site.    Consulted and Agree with Plan of Care Family member/caregiver;Patient    Family Member Consulted daughter, [name], and grandson           Patient will benefit from skilled therapeutic intervention in order to improve the following deficits and impairments:   Body Structure / Function / Physical Skills: ADL,Balance,Coordination,IADL,Pain,GMC,Hearing,Strength,Tone,Sensation,Dexterity,Decreased knowledge of use of DME,FMC,UE functional use,ROM,Flexibility Cognitive Skills: Attention,Sequencing,Problem Solve     Visit Diagnosis: Acute pain of left shoulder  Stiffness of left shoulder, not elsewhere classified  Hemiplegia and hemiparesis following cerebral infarction affecting left non-dominant side  (HCC)  Other lack of coordination  Muscle weakness (generalized)    Problem List Patient Active Problem List   Diagnosis Date Noted  . Wheelchair dependence 10/07/2020  . Left knee pain 07/26/2020  . Cerebrovascular accident (CVA) of right basal ganglia (HCC) 07/26/2020  . Left hemiparesis (HCC) 07/25/2020  . Hypokalemia 07/20/2020  . Right basal ganglia embolic stroke (HCC) 07/20/2020  . Essential hypertension 07/20/2020  . Hypothyroidism 07/20/2020    Junious Dresser MOT, OTR/L  11/11/2020, 12:07 PM  [facility] [address] [place], [place], [address] Phone: [phone]   Fax:  [phone]  Name: HILDUR BAYER MRN: [id number] Date of Birth: [birth year]

## 2020-11-16 ENCOUNTER — Encounter: Payer: Self-pay | Admitting: Physical Therapy

## 2020-11-16 ENCOUNTER — Other Ambulatory Visit: Payer: Self-pay

## 2020-11-16 ENCOUNTER — Ambulatory Visit: Payer: Medicare Other | Admitting: Occupational Therapy

## 2020-11-16 ENCOUNTER — Ambulatory Visit: Payer: Medicare Other | Admitting: Physical Therapy

## 2020-11-16 DIAGNOSIS — M25612 Stiffness of left shoulder, not elsewhere classified: Secondary | ICD-10-CM

## 2020-11-16 DIAGNOSIS — R2681 Unsteadiness on feet: Secondary | ICD-10-CM | POA: Diagnosis not present

## 2020-11-16 DIAGNOSIS — I69354 Hemiplegia and hemiparesis following cerebral infarction affecting left non-dominant side: Secondary | ICD-10-CM

## 2020-11-16 DIAGNOSIS — R262 Difficulty in walking, not elsewhere classified: Secondary | ICD-10-CM | POA: Diagnosis not present

## 2020-11-16 DIAGNOSIS — R278 Other lack of coordination: Secondary | ICD-10-CM | POA: Diagnosis not present

## 2020-11-16 DIAGNOSIS — M6281 Muscle weakness (generalized): Secondary | ICD-10-CM

## 2020-11-16 DIAGNOSIS — M25512 Pain in left shoulder: Secondary | ICD-10-CM | POA: Diagnosis not present

## 2020-11-16 DIAGNOSIS — R2689 Other abnormalities of gait and mobility: Secondary | ICD-10-CM | POA: Diagnosis not present

## 2020-11-16 NOTE — Patient Instructions (Signed)
Access Code: [email or web address] URL: [email or web address] Date: 11/16/2020 Prepared by: Sherlie Ban  Exercises Supine Bridge - 1 x daily - 5 x weekly - 2 sets - 5 reps Supine March - 1 x daily - 5 x weekly - 2 sets - 10 reps - yellow band Seated Heel Raise - 1 x daily - 5 x weekly - 2 sets - 10 reps Bent Knee Fallouts - 1 x daily - 5 x weekly - 2 sets - 10 reps - yellow band Supine Heel Slide - 1 x daily - 5 x weekly - 2 sets - 10 reps Seated Hip Abduction with Resistance - 1 x daily - 5 x weekly - 2 sets - 10 reps - yellow band

## 2020-11-16 NOTE — Therapy (Signed)
[facility] [address] [place], [place], [address] Phone: [phone]   Fax:  [phone]  Occupational Therapy Treatment  Patient Details  Name: Nicole [name] MRN: [id number] Date of Birth: 1930-08-19 Referring Provider (OT): Genice Rouge, MD   Encounter Date: 11/16/2020   OT End of Session - 11/16/20 0932    Visit Number 3    Number of Visits 17    Date for OT Re-Evaluation 01/04/21    Authorization Type BCBS Medicare    Authorization Time Period $40 Copay VL:MN No Auth required  10th visit PN    Authorization - Visit Number 3    Authorization - Number of Visits 10    Progress Note Due on Visit 10    OT Start Time 0845    OT Stop Time 0928    OT Time Calculation (min) 43 min    Activity Tolerance Patient tolerated treatment well    Behavior During Therapy [facility] for tasks assessed/performed           Past Medical History:  Diagnosis Date  . Hypertension   . Stroke [facility])    September 2021  . Thyroid disease     No past surgical history on file.  There were no vitals filed for this visit.   Subjective Assessment - 11/16/20 0850    Subjective  Pt denies any pain unless going above mid range in supine    Patient is accompanied by: --   caregiver   Pertinent History CVA R basal ganglia, HTN, HLD, hypothyroidism, asthma, glaucoma    Limitations fall risk. hard of hearing    Currently in Pain? No/denies           Reviewed previously issued HEP (self sh flexion reaching towards floor and table slides). Added HEP in supine (self sh flex to eye level, and chest press) - see pt instructions for details. Pt required mod cueing d/t possible apraxia and weakness.  Seated: AA/ROM in gravity elim plane LUE using UE Ranger. Further assessed scapula mobility - pt elevated and w/ decreased depression and retraction.  Issued bed mobility handout as pt reports difficulty getting comfortable to sleep. Pt reports  sleeping on Lt hemiplegic side - therapist explained this method is least preferred and recommended sleeping on back or Rt side if possible. Handout w/ pictures issued and discussed for optimal positioning                     OT Education - 11/16/20 0916    Education Details bed positioning handout, supine HEP    Person(s) Educated Patient;Caregiver(s)    Methods Explanation;Demonstration;Handout;Verbal cues    Comprehension Verbalized understanding;Returned demonstration;Verbal cues required;Need further instruction            OT Short Term Goals - 11/11/20 1152      OT SHORT TERM GOAL #1   Title Pt will be independent with HEP (12/07/20)    Time 4    Period Weeks    Status On-going    Target Date 12/07/20      OT SHORT TERM GOAL #2   Title Pt will improve grip strength in LUE by 3 lbs or greater for increase in functional use of LUE and increase independence with clothing management.    Baseline RUE 42.1 LUE 26.8    Time 4    Period Weeks    Status New      OT SHORT TERM GOAL #3   Title  Pt will obtain item from mid level shelf with increase in shoulder flexion to 70 degrees with good positioning and reduction in shoulder hiking and compensatory movements.    Baseline LUE 66 degrees sh flexion    Time 4    Period Weeks    Status New      OT SHORT TERM GOAL #4   Title Pt will demonstrate improved box and blocks score to 35 or greater with LUE for increase in functional use of LUE.    Baseline 29 LUE    Time 4    Period Weeks    Status New      OT SHORT TERM GOAL #5   Title Pt will verbalize understanding of sleep positions for LUE to decrease pain.    Baseline 6/10 LUE shoulder pain at night    Time 4    Period Weeks    Status New      OT SHORT TERM GOAL #6   Title Pt will don bra with mod I    Baseline daughter currently assisting    Time 4    Period Weeks    Status New      OT SHORT TERM GOAL #7   Time 4    Period Weeks    Status New              OT Long Term Goals - 11/09/20 1604      OT LONG TERM GOAL #1   Title Pt will be independent with updated HEP 01/04/2021    Time 8    Period Weeks    Status New    Target Date 01/04/21      OT LONG TERM GOAL #2   Title Pt will demonstrate increased grip strength in LUE to 33 lbs or greater for improved functional use.    Baseline RUE 42.1 LUE 26.8    Time 8    Period Weeks    Status New      OT LONG TERM GOAL #3   Title Pt will perform UB and LB dressing with mod I.    Time 8    Period Weeks    Status New      OT LONG TERM GOAL #4   Title Pt will increase fine motor coordination by completing 9 hole peg test in 50 seconds or less with LUE.    Baseline RUE 27.47 LUE 61.87    Time 8    Period Weeks    Status New      OT LONG TERM GOAL #5   Title Pt will improve shoulder flexion to at least 100 degrees in order to obtain object and increase functional reach.    Baseline LUE 66 degrees sh flexion    Time 8    Period Weeks    Status New      OT LONG TERM GOAL #6   Title Pt will report decrease in pain in LUE shoulder at night to less than or equal to 4/10 with improved sleep positioning and range of motion    Time 8    Period Weeks    Status New                 Plan - 11/16/20 0934    Clinical Impression Statement Pt with elevation and decreased mobility Lt scapula. Pt tends to go into hiking pattern at shoulder to lead movement. Also ? apraxia vs. decreased processing    OT Occupational Profile and History  Problem Focused Assessment - Including review of records relating to presenting problem    Occupational performance deficits (Please refer to evaluation for details): ADL's;IADL's    Body Structure / Function / Physical Skills ADL;Balance;Coordination;IADL;Pain;GMC;Hearing;Strength;Tone;Sensation;Dexterity;Decreased knowledge of use of DME;FMC;UE functional use;ROM;Flexibility    Cognitive Skills Attention;Sequencing;Problem Solve    Rehab Potential  Good    Clinical Decision Making Limited treatment options, no task modification necessary    Comorbidities Affecting Occupational Performance: None    Modification or Assistance to Complete Evaluation  No modification of tasks or assist necessary to complete eval    OT Frequency 2x / week    OT Duration 8 weeks   ot 16 visits + eval over extended period d/t scheduling.   OT Treatment/Interventions Self-care/ADL training;Cryotherapy;Ultrasound;Moist Heat;Electrical Stimulation;Paraffin;Energy conservation;DME and/or AE instruction;Manual Therapy;Functional Mobility Training;Neuromuscular education;Therapeutic exercise;Visual/perceptual remediation/compensation;Patient/family education;Balance training;Therapeutic activities;Passive range of motion;Cognitive remediation/compensation    Plan review HEP's, scapula mobility (? sidelying), AA/ROM along slightly elevated surface seated, low range reaching/coordination    OT Home Exercise Plan table slides, self PROM in sitting    Recommended Other Services Pt receives PT at this site.    Consulted and Agree with Plan of Care Family member/caregiver;Patient    Family Member Consulted daughter, [name], and grandson           Patient will benefit from skilled therapeutic intervention in order to improve the following deficits and impairments:   Body Structure / Function / Physical Skills: ADL,Balance,Coordination,IADL,Pain,GMC,Hearing,Strength,Tone,Sensation,Dexterity,Decreased knowledge of use of DME,FMC,UE functional use,ROM,Flexibility Cognitive Skills: Attention,Sequencing,Problem Solve     Visit Diagnosis: Hemiplegia and hemiparesis following cerebral infarction affecting left non-dominant side (HCC)  Stiffness of left shoulder, not elsewhere classified  Other lack of coordination  Muscle weakness (generalized)  Unsteadiness on feet    Problem List Patient Active Problem List   Diagnosis Date Noted  . Wheelchair dependence  10/07/2020  . Left knee pain 07/26/2020  . Cerebrovascular accident (CVA) of right basal ganglia (HCC) 07/26/2020  . Left hemiparesis (HCC) 07/25/2020  . Hypokalemia 07/20/2020  . Right basal ganglia embolic stroke (HCC) 07/20/2020  . Essential hypertension 07/20/2020  . Hypothyroidism 07/20/2020    Kelli Churn, OTR/L 11/16/2020, 9:36 AM  [facility] [address] [place], [place], [address] Phone: [phone]   Fax:  [phone]  Name: Nicole [name] MRN: [id number] Date of Birth: [birth year]

## 2020-11-16 NOTE — Patient Instructions (Signed)
Flexion (Assistive)    Laying down with knees bent, hold Lt wrist with Rt hand (Lt thumb side up), keeping elbows as straight as possible and raise just above eye level (not as far as picture). Repeat __10__ times. Do __2__ sessions per day.   ELBOW: Extension / Chest Press (Frame)    Lie on back with knees bent. Hold paper towel roll, straighten elbows towards ceiling, then back to chest (chest press). Repeat  _10__ reps per set, _2__ sets per day. Do not tense shoulders

## 2020-11-16 NOTE — Therapy (Signed)
[facility] [address] [place], [place], [address] Phone: [phone]   Fax:  [phone]  Physical Therapy Treatment  Patient Details  Name: Nicole [name] MRN: [id number] Date of Birth: Feb 23, 1930 Referring Provider (PT): Courtney Heys, MD   Encounter Date: 11/16/2020   PT End of Session - 11/16/20 1044    Visit Number 7    Number of Visits 17    Date for PT Re-Evaluation 01/10/21   POC for 8 weeks, Cert for 90 days   Authorization Type BCBS Medicare (10th Visit PN)    Progress Note Due on Visit 10    PT Start Time 0931    PT Stop Time 1014    PT Time Calculation (min) 43 min    Equipment Utilized During Treatment Gait belt    Activity Tolerance Patient tolerated treatment well;No increased pain    Behavior During Therapy WFL for tasks assessed/performed           Past Medical History:  Diagnosis Date  . Hypertension   . Stroke [facility])    September 2021  . Thyroid disease     History reviewed. No pertinent surgical history.  There were no vitals filed for this visit.   Subjective Assessment - 11/16/20 0932    Subjective No changes since she was last here. Has been working on the exercises at home.    Patient is accompained by: Family member    Pertinent History R Basal Ganglia CVA, Hyperlipidemia, Hypothyroidism, Asthma, Glaucoma, HTN    Limitations Standing;Walking    Patient Stated Goals Be able to walk;    Currently in Pain? No/denies                             [facility] Adult PT Treatment/Exercise - 11/16/20 0001      Bed Mobility   Supine to Sit Supervision/Verbal cueing   takes incr time, use of chair to simulate bed rail as this is what pt has at home, also used wedge as pt has a hospital bed at home   Sit to Supine Supervision/Verbal cueing      Transfers   Transfers Sit to Stand;Stand to Sit;Stand Pivot Transfers    Sit to Stand 4: Min guard;With upper extremity assist     Sit to Stand Details Tactile cues for weight shifting;Tactile cues for placement;Verbal cues for sequencing;Verbal cues for technique;Verbal cues for precautions/safety;Verbal cues for safe use of DME/AE;Manual facilitation for weight shifting    Sit to Stand Details (indicate cue type and reason) verbal cues for proper hand placement and nose over toes    Stand to Sit 4: Min guard;With upper extremity assist;To bed;To chair/3-in-1    Stand to Sit Details (indicate cue type and reason) Tactile cues for weight shifting;Verbal cues for sequencing;Verbal cues for technique;Verbal cues for precautions/safety;Verbal cues for safe use of DME/AE;Manual facilitation for weight shifting    Stand to Sit Details cues for controlled descent    Number of Reps --   1 set of 5 reps     Ambulation/Gait   Ambulation/Gait Yes    Ambulation/Gait Assistance 4: Min guard;4: Min assist    Ambulation/Gait Assistance Details with RW and L anterior ottobock AFO. initial step to pattern but progressing to step through with incr distance, needing initial min A for RW propulsion. w/c follow for safety.    Ambulation Distance (Feet) 115 Feet    Assistive device Rolling  walker    Gait Pattern Step-to pattern;Step-through pattern;Decreased step length - right;Decreased step length - left;Decreased stance time - left;Decreased hip/knee flexion - left;Decreased stride length;Decreased weight shift to left;Left flexed knee in stance;Antalgic;Trunk flexed;Narrow base of support    Ambulation Surface Level;Indoor    Pre-Gait Activities standing at RW: x10 reps foot taps with RLE to 2" block for incr weight shifting/stance time on LLE, min guard for balance             Access Code: [email or web address] URL: [email or web address] Date: 11/16/2020 Prepared by: Janann August  Reviewed pt's HEP below, upgraded exercises with use of yellow tband, pt needing initial verbal cues for proper technique.   Exercises Supine Bridge  - 1 x daily - 5 x weekly - 2 sets - 5 reps Supine March - 1 x daily - 5 x weekly - 2 sets - 10 reps - yellow band Seated Heel Raise - 1 x daily - 5 x weekly - 2 sets - 10 reps Bent Knee Fallouts - 1 x daily - 5 x weekly - 2 sets - 10 reps - yellow band Supine Heel Slide - 1 x daily - 5 x weekly - 2 sets - 10 reps Seated Hip Abduction with Resistance - 1 x daily - 5 x weekly - 2 sets - 10 reps - yellow band       PT Education - 11/16/20 1044    Education Details progress towards goals, reviewed HEP with pt and caregiver and educated caregiver Pam to also perform with pt at home (as pt only just performing by herself or when daughter is present)    Person(s) Educated Patient    Methods Explanation;Demonstration;Tactile cues;Verbal cues;Handout    Comprehension Verbalized understanding;Returned demonstration            PT Short Term Goals - 11/16/20 0954      PT SHORT TERM GOAL #1   Title Patient will be independent with initial HEP with caregiver assistance (ALL STGS Due: 11/09/20)    Baseline reviewed on 11/16/20    Time 4    Period Weeks    Status Partially Met    Target Date 11/09/20      PT SHORT TERM GOAL #2   Title Patient will demo ability to compelte all bed mobility with CGA to demonstrate improved independence with functional mobility    Baseline supervision, takes incr time  on 11/16/20    Time 4    Period Weeks    Status Achieved      PT SHORT TERM GOAL #3   Title Patient will demo ability to ambulate >= 30 ft with RW and CGA to demonstrate improved household ambulation    Baseline 5 ft, 115' with RW and  min guard/min A at times for RW steering    Time 4    Period Weeks    Status Partially Met      PT SHORT TERM GOAL #4   Title Patient will demo ability to complete sit <> stand with min A and RW to demonstrate improved functional mobility    Baseline Min - Mod A, min guard on 11/16/20    Time 4    Period Weeks    Status Achieved             PT Long Term  Goals - 10/12/20 6256      PT LONG TERM GOAL #1   Title Patient will be independent with final HEP with caregiver assistance (ALL LTGs  Due: 12/07/20)    Baseline no HEP established    Time 8    Period Weeks    Status New    Target Date 12/07/20      PT LONG TERM GOAL #2   Title Patient will be able to ambulate >/= 115 ft on indoor level surfaces with CGA and LRAD to demonstrate improved household mobility    Baseline 5 ft    Time 8    Period Weeks    Status New      PT LONG TERM GOAL #3   Title Patient will demo ability to complete sit <> stand  and stand pivot transfer with CGA with RW to demosntrate improved independence with functional mobility    Baseline Min - Mod A    Time 8    Period Weeks    Status New      PT LONG TERM GOAL #4   Title LTG to be set for TUG when able to assess    Baseline TBA    Time 8    Period Weeks    Status New                 Plan - 11/16/20 1051    Clinical Impression Statement Checked pt's STGs today with pt achieving STGs #2 and #4. Pt able to perform bed mobility today with supervision with use of a modified bed rail and incline (pt with hospital bed at home). Pt able to perform sit <> stands with min guard with RW, but does need proper cueing for technique and pt with decr eccentric control at times. Pt partially met STGs #1 and #3. Pt able to ambulate 115' with RW and L anterior ottobock AFO with min guard and occassional min A for steering. Will continue to progress towards LTGs.    Personal Factors and Comorbidities Comorbidity 3+;Time since onset of injury/illness/exacerbation;Age    Comorbidities R Basal Ganglia CVA, Hyperlipidemia, Hypothyroidism, Asthma, Glaucoma, HTN    Examination-Activity Limitations Bed Mobility;Dressing;Locomotion Level;Reach Overhead;Stairs;Stand;Transfers    Examination-Participation Restrictions Armed forces logistics/support/administrative officer Evolving/Moderate complexity    Rehab  Potential Fair    PT Frequency 2x / week    PT Duration 8 weeks    PT Treatment/Interventions ADLs/Self Care Home Management;Cryotherapy;Electrical Stimulation;Moist Heat;DME Instruction;Gait training;Stair training;Functional mobility training;Therapeutic activities;Therapeutic exercise;Balance training;Neuromuscular re-education;Patient/family education;Orthotic Fit/Training;Manual techniques;Passive range of motion    PT Next Visit Plan continue to work on LE strengthening.  continue with use of anterior Ottobock brace. [name] from [facility] will be present on 1/14 appt. Continue to work on standing posture and weight shifitng.    PT Home Exercise Plan Access Code: [email or web address]    Consulted and Agree with Plan of Care Patient           Patient will benefit from skilled therapeutic intervention in order to improve the following deficits and impairments:  Abnormal gait,Decreased balance,Decreased mobility,Decreased endurance,Difficulty walking,Impaired tone,Impaired sensation,Pain,Decreased strength,Decreased safety awareness,Decreased knowledge of use of DME,Decreased coordination,Decreased activity tolerance,Decreased range of motion  Visit Diagnosis: Hemiplegia and hemiparesis following cerebral infarction affecting left non-dominant side (HCC)  Other lack of coordination  Muscle weakness (generalized)  Difficulty in walking, not elsewhere classified  Unsteadiness on feet     Problem List Patient Active Problem List   Diagnosis Date Noted  . Wheelchair dependence 10/07/2020  . Left knee pain 07/26/2020  . Cerebrovascular accident (CVA) of right basal ganglia ([place]) 07/26/2020  . Left hemiparesis ([place]) 07/25/2020  . Hypokalemia 07/20/2020  .  Right basal ganglia embolic stroke ([place]) 19/91/4445  . Essential hypertension 07/20/2020  . Hypothyroidism 07/20/2020    Lillia Pauls, DPT  11/16/2020, 10:54 AM  [facility] [address] [place], [place], [address] Phone: [phone]   Fax:  [phone]  Name: Nicole [name] MRN: [id number] Date of Birth: [birth year]

## 2020-11-18 ENCOUNTER — Ambulatory Visit: Payer: Medicare Other

## 2020-11-18 ENCOUNTER — Other Ambulatory Visit: Payer: Self-pay

## 2020-11-18 ENCOUNTER — Ambulatory Visit: Payer: Medicare Other | Admitting: Occupational Therapy

## 2020-11-18 ENCOUNTER — Encounter: Payer: Self-pay | Admitting: Occupational Therapy

## 2020-11-18 DIAGNOSIS — R2681 Unsteadiness on feet: Secondary | ICD-10-CM | POA: Diagnosis not present

## 2020-11-18 DIAGNOSIS — M6281 Muscle weakness (generalized): Secondary | ICD-10-CM | POA: Diagnosis not present

## 2020-11-18 DIAGNOSIS — M25512 Pain in left shoulder: Secondary | ICD-10-CM

## 2020-11-18 DIAGNOSIS — R278 Other lack of coordination: Secondary | ICD-10-CM | POA: Diagnosis not present

## 2020-11-18 DIAGNOSIS — R2689 Other abnormalities of gait and mobility: Secondary | ICD-10-CM

## 2020-11-18 DIAGNOSIS — M25612 Stiffness of left shoulder, not elsewhere classified: Secondary | ICD-10-CM

## 2020-11-18 DIAGNOSIS — I69354 Hemiplegia and hemiparesis following cerebral infarction affecting left non-dominant side: Secondary | ICD-10-CM | POA: Diagnosis not present

## 2020-11-18 DIAGNOSIS — R262 Difficulty in walking, not elsewhere classified: Secondary | ICD-10-CM

## 2020-11-18 NOTE — Patient Instructions (Signed)
     Self Passive Range of Motion    In sitting, hold cane with both hands and slide across legs and down to toes for a stretch for your shoulder. Repeat 10 times and 2-3 times a day.

## 2020-11-18 NOTE — Therapy (Signed)
[facility] [address] [place], [place], [address] Phone: [phone]   Fax:  [phone]  Occupational Therapy Treatment  Patient Details  Name: Nicole [name] MRN: [id number] Date of Birth: [birth year] Referring Provider (OT): Genice Rouge, MD   Encounter Date: 11/18/2020   OT End of Session - 11/18/20 1012    Visit Number 4    Number of Visits 17    Date for OT Re-Evaluation 01/04/21    Authorization Type BCBS Medicare    Authorization Time Period $40 Copay VL:MN No Auth required  10th visit PN    Authorization - Visit Number 4    Authorization - Number of Visits 10    Progress Note Due on Visit 10    OT Start Time 1012    OT Stop Time 1050    OT Time Calculation (min) 38 min    Activity Tolerance Patient tolerated treatment well    Behavior During Therapy [facility] for tasks assessed/performed           Past Medical History:  Diagnosis Date  . Hypertension   . Stroke [facility])    September 2021  . Thyroid disease     History reviewed. No pertinent surgical history.  There were no vitals filed for this visit.   Subjective Assessment - 11/18/20 1016    Subjective  Pt denies any pain. Says her shoulder feels better.    Patient is accompanied by: --   caregiver   Pertinent History CVA R basal ganglia, HTN, HLD, hypothyroidism, asthma, glaucoma    Limitations fall risk. hard of hearing    Currently in Pain? No/denies                        OT Treatments/Exercises (OP) - 11/18/20 1156      Transfers   Comments min A for squat pivot      Neurological Re-education Exercises   Other Exercises 1 pt completed forward reach with unweighted can for self passive or active assisted range of motion x 10 reps. Pt with good movement pattern and no shoulder hiking during activity. x 5 russian twist with unweight cane and x 5 abudction of LUE active assisted with RUE.    Other Exercises 2 UE Ranger in side  lying for shoulder flexion and extension and scapular protraction/retraction. Pt worked on active assisted range of motion for LUE in sitting and sidelying with therapist providing tactile cueing for shoudler hiking in sitting and for mod A for support and weakness throughout movements    Other Weight-Bearing Exercises 1 weight bearing into LUE while completing 10 diagonal reaching while seated edge of mat      Manual Therapy   Manual Therapy Soft tissue mobilization    Soft tissue mobilization L trap - over activation secondary to increased guarding and shoulder hiking                    OT Short Term Goals - 11/11/20 1152      OT SHORT TERM GOAL #1   Title Pt will be independent with HEP (12/07/20)    Time 4    Period Weeks    Status On-going    Target Date 12/07/20      OT SHORT TERM GOAL #2   Title Pt will improve grip strength in LUE by 3 lbs or greater for increase in functional use of LUE and increase independence with clothing management.  Baseline RUE 42.1 LUE 26.8    Time 4    Period Weeks    Status New      OT SHORT TERM GOAL #3   Title Pt will obtain item from mid level shelf with increase in shoulder flexion to 70 degrees with good positioning and reduction in shoulder hiking and compensatory movements.    Baseline LUE 66 degrees sh flexion    Time 4    Period Weeks    Status New      OT SHORT TERM GOAL #4   Title Pt will demonstrate improved box and blocks score to 35 or greater with LUE for increase in functional use of LUE.    Baseline 29 LUE    Time 4    Period Weeks    Status New      OT SHORT TERM GOAL #5   Title Pt will verbalize understanding of sleep positions for LUE to decrease pain.    Baseline 6/10 LUE shoulder pain at night    Time 4    Period Weeks    Status New      OT SHORT TERM GOAL #6   Title Pt will don bra with mod I    Baseline daughter currently assisting    Time 4    Period Weeks    Status New      OT SHORT TERM GOAL  #7   Time 4    Period Weeks    Status New             OT Long Term Goals - 11/09/20 1604      OT LONG TERM GOAL #1   Title Pt will be independent with updated HEP 01/04/2021    Time 8    Period Weeks    Status New    Target Date 01/04/21      OT LONG TERM GOAL #2   Title Pt will demonstrate increased grip strength in LUE to 33 lbs or greater for improved functional use.    Baseline RUE 42.1 LUE 26.8    Time 8    Period Weeks    Status New      OT LONG TERM GOAL #3   Title Pt will perform UB and LB dressing with mod I.    Time 8    Period Weeks    Status New      OT LONG TERM GOAL #4   Title Pt will increase fine motor coordination by completing 9 hole peg test in 50 seconds or less with LUE.    Baseline RUE 27.47 LUE 61.87    Time 8    Period Weeks    Status New      OT LONG TERM GOAL #5   Title Pt will improve shoulder flexion to at least 100 degrees in order to obtain object and increase functional reach.    Baseline LUE 66 degrees sh flexion    Time 8    Period Weeks    Status New      OT LONG TERM GOAL #6   Title Pt will report decrease in pain in LUE shoulder at night to less than or equal to 4/10 with improved sleep positioning and range of motion    Time 8    Period Weeks    Status New                 Plan - 11/18/20 1211    Clinical Impression Statement Pt  continues to present with excessive shoulder hiking for LUE. Pt with good movement with tactile cueing this day and mod cues/support.    OT Occupational Profile and History Problem Focused Assessment - Including review of records relating to presenting problem    Occupational performance deficits (Please refer to evaluation for details): ADL's;IADL's    Body Structure / Function / Physical Skills ADL;Balance;Coordination;IADL;Pain;GMC;Hearing;Strength;Tone;Sensation;Dexterity;Decreased knowledge of use of DME;FMC;UE functional use;ROM;Flexibility    Cognitive Skills  Attention;Sequencing;Problem Solve    Rehab Potential Good    Clinical Decision Making Limited treatment options, no task modification necessary    Comorbidities Affecting Occupational Performance: None    Modification or Assistance to Complete Evaluation  No modification of tasks or assist necessary to complete eval    OT Frequency 2x / week    OT Duration 8 weeks   ot 16 visits + eval over extended period d/t scheduling.   OT Treatment/Interventions Self-care/ADL training;Cryotherapy;Ultrasound;Moist Heat;Electrical Stimulation;Paraffin;Energy conservation;DME and/or AE instruction;Manual Therapy;Functional Mobility Training;Neuromuscular education;Therapeutic exercise;Visual/perceptual remediation/compensation;Patient/family education;Balance training;Therapeutic activities;Passive range of motion;Cognitive remediation/compensation    Plan review HEP's, scapula mobility (? sidelying), AA/ROM along slightly elevated surface seated, low range reaching/coordination    OT Home Exercise Plan table slides, self PROM in sitting    Recommended Other Services Pt receives PT at this site.    Consulted and Agree with Plan of Care Family member/caregiver;Patient    Family Member Consulted daughter, [name], and grandson           Patient will benefit from skilled therapeutic intervention in order to improve the following deficits and impairments:   Body Structure / Function / Physical Skills: ADL,Balance,Coordination,IADL,Pain,GMC,Hearing,Strength,Tone,Sensation,Dexterity,Decreased knowledge of use of DME,FMC,UE functional use,ROM,Flexibility Cognitive Skills: Attention,Sequencing,Problem Solve     Visit Diagnosis: Acute pain of left shoulder  Muscle weakness (generalized)  Unsteadiness on feet  Stiffness of left shoulder, not elsewhere classified  Other lack of coordination  Other abnormalities of gait and mobility  Hemiplegia and hemiparesis following cerebral infarction affecting left  non-dominant side [facility])    Problem List Patient Active Problem List   Diagnosis Date Noted  . Wheelchair dependence 10/07/2020  . Left knee pain 07/26/2020  . Cerebrovascular accident (CVA) of right basal ganglia (HCC) 07/26/2020  . Left hemiparesis (HCC) 07/25/2020  . Hypokalemia 07/20/2020  . Right basal ganglia embolic stroke (HCC) 07/20/2020  . Essential hypertension 07/20/2020  . Hypothyroidism 07/20/2020    Junious Dresser MOT, OTR/L  11/18/2020, 12:12 PM  [facility] [address] [place], [place], [address] Phone: [phone]   Fax:  [phone]  Name: Nicole [name] MRN: [id number] Date of Birth: 09-30-1930

## 2020-11-18 NOTE — Therapy (Signed)
[facility] [address] [place], [place], [address] Phone: [phone]   Fax:  [phone]  Physical Therapy Treatment  Patient Details  Name: Nicole [name] MRN: [id number] Date of Birth: [birth year] Referring Provider (PT): Nicole Heys, MD   Encounter Date: 11/18/2020   PT End of Session - 11/18/20 0936    Visit Number 8    Number of Visits 17    Date for PT Re-Evaluation 01/10/21   POC for 8 weeks, Cert for 90 days   Authorization Type BCBS Medicare (10th Visit PN)    Progress Note Due on Visit 10    PT Start Time 0932    PT Stop Time 1014    PT Time Calculation (min) 42 min    Equipment Utilized During Treatment Gait belt    Activity Tolerance Patient tolerated treatment well;No increased pain    Behavior During Therapy WFL for tasks assessed/performed           Past Medical History:  Diagnosis Date  . Hypertension   . Stroke [facility])    September 2021  . Thyroid disease     History reviewed. No pertinent surgical history.  There were no vitals filed for this visit.   Subjective Assessment - 11/18/20 0934    Subjective No changes since last visit. No falls. No pain to report.    Patient is accompained by: Family member    Pertinent History R Basal Ganglia CVA, Hyperlipidemia, Hypothyroidism, Asthma, Glaucoma, HTN    Limitations Standing;Walking    Patient Stated Goals Be able to walk;    Currently in Pain? No/denies                [facility] Adult PT Treatment/Exercise - 11/18/20 0001      Transfers   Transfers Sit to Stand;Stand to Sit;Stand Pivot Transfers    Sit to Stand 4: Min guard;With upper extremity assist    Sit to Stand Details Tactile cues for weight shifting;Tactile cues for placement;Verbal cues for sequencing;Verbal cues for technique;Verbal cues for precautions/safety;Verbal cues for safe use of DME/AE;Manual facilitation for weight shifting    Sit to Stand Details (indicate cue type  and reason) continued tactile cues for improved BLE alignment and posture upon standing.    Stand to Sit 4: Min guard;With upper extremity assist;To bed;To chair/3-in-1    Stand to Sit Details (indicate cue type and reason) Tactile cues for weight shifting;Verbal cues for sequencing;Verbal cues for technique;Verbal cues for precautions/safety;Verbal cues for safe use of DME/AE;Manual facilitation for weight shifting    Stand to Sit Details cues for control, and reach back to surface prior to descent    Number of Reps 10 reps;1 set    Comments worked on sit <> stand trainign from elevated mat, PT providing tactile cues and assistance to promtoe improved alignment of LLE.      Ambulation/Gait   Ambulation/Gait Yes    Ambulation/Gait Assistance 4: Min guard    Ambulation/Gait Assistance Details completed ambulation with RW and L Anterior Ottobok AFO. Verbal cues for improved step length on RLE. Completed ambulation 2 x 115 ft. CGA throughout for balance. Intermittent rest breaks. Nicole [name] from [place] was supposed to be present for appointment, but was off work today. Primary PT to reschedule appointment.    Ambulation Distance (Feet) 115 Feet   x 2   Assistive device Rolling walker    Gait Pattern Step-to pattern;Step-through pattern;Decreased step length - right;Decreased step length - left;Decreased stance time -  left;Decreased hip/knee flexion - left;Decreased stride length;Decreased weight shift to left;Left flexed knee in stance;Antalgic;Trunk flexed;Narrow base of support    Ambulation Surface Level;Indoor      Neuro Re-ed    Neuro Re-ed Details  Standing at RW: completed steps fwd/backward with RLE to promote improved weight shift to LLE. PT providing manual faciliation to pelvis for imrpoved weight shift and posture, completed 2 x 10 reps. Intermittent rest breaks required.                  PT Education - 11/18/20 1203    Education Details will rescedule Nicole [name] from [place] to come to  [employer]) Educated Patient;Child(ren)    Methods Explanation    Comprehension Verbalized understanding            PT Short Term Goals - 11/16/20 0954      PT SHORT TERM GOAL #1   Title Patient will be independent with initial HEP with caregiver assistance (ALL STGS Due: 11/09/20)    Baseline reviewed on 11/16/20    Time 4    Period Weeks    Status Partially Met    Target Date 11/09/20      PT SHORT TERM GOAL #2   Title Patient will demo ability to compelte all bed mobility with CGA to demonstrate improved independence with functional mobility    Baseline supervision, takes incr time  on 11/16/20    Time 4    Period Weeks    Status Achieved      PT SHORT TERM GOAL #3   Title Patient will demo ability to ambulate >= 30 ft with RW and CGA to demonstrate improved household ambulation    Baseline 5 ft, 115' with RW and  min guard/min A at times for RW steering    Time 4    Period Weeks    Status Partially Met      PT SHORT TERM GOAL #4   Title Patient will demo ability to complete sit <> stand with min A and RW to demonstrate improved functional mobility    Baseline Min - Mod A, min guard on 11/16/20    Time 4    Period Weeks    Status Achieved             PT Long Term Goals - 10/12/20 7782      PT LONG TERM GOAL #1   Title Patient will be independent with final HEP with caregiver assistance (ALL LTGs Due: 12/07/20)    Baseline no HEP established    Time 8    Period Weeks    Status New    Target Date 12/07/20      PT LONG TERM GOAL #2   Title Patient will be able to ambulate >/= 115 ft on indoor level surfaces with CGA and LRAD to demonstrate improved household mobility    Baseline 5 ft    Time 8    Period Weeks    Status New      PT LONG TERM GOAL #3   Title Patient will demo ability to complete sit <> stand  and stand pivot transfer with CGA with RW to demosntrate improved independence with functional mobility    Baseline Min - Mod A    Time 8    Period  Weeks    Status New      PT LONG TERM GOAL #4   Title LTG to be set for TUG when able to assess  Baseline TBA    Time 8    Period Weeks    Status New                 Plan - 11/18/20 1205    Clinical Impression Statement Nicole [name] from [employer] not present for session as originally scheduled, primary PT will reschedule for later date. Session spent continued gait training with L Anterior ottobok working on improved step length and weight shift. Continued transfer training and activites to promote improved weight shift throughout session. Will continue to progress toward LTGs.    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  continue with use of anterior Ottobock brace. Reschedule with Nicole [name]. Continue to work on standing posture and weight shifitng.    PT Home Exercise Plan Access Code: [email or web address]    Consulted and Agree with Plan of Care Patient           Patient will benefit from skilled therapeutic intervention in order to improve the following deficits and impairments:  Abnormal gait,Decreased balance,Decreased  mobility,Decreased endurance,Difficulty walking,Impaired tone,Impaired sensation,Pain,Decreased strength,Decreased safety awareness,Decreased knowledge of use of DME,Decreased coordination,Decreased activity tolerance,Decreased range of motion  Visit Diagnosis: Difficulty in walking, not elsewhere classified  Hemiplegia and hemiparesis following cerebral infarction affecting left non-dominant side (HCC)  Other abnormalities of gait and mobility  Muscle weakness (generalized)  Unsteadiness on feet     Problem List Patient Active Problem List   Diagnosis Date Noted  . Wheelchair dependence 10/07/2020  . Left knee pain 07/26/2020  . Cerebrovascular accident (CVA) of right basal ganglia ([place]) 07/26/2020  . Left hemiparesis ([place]) 07/25/2020  . Hypokalemia 07/20/2020  . Right basal ganglia embolic stroke ([place]) 62/83/1517  . Essential hypertension 07/20/2020  . Hypothyroidism 07/20/2020    Jones Bales, PT, DPT 11/18/2020, 12:09 PM  [facility] [address] [place], [place], [address] Phone: [phone]   Fax:  [phone]  Name: Nicole [name] MRN: [id number] Date of Birth: 1930/10/31

## 2020-11-19 DIAGNOSIS — I6381 Other cerebral infarction due to occlusion or stenosis of small artery: Secondary | ICD-10-CM | POA: Diagnosis not present

## 2020-11-19 DIAGNOSIS — G8194 Hemiplegia, unspecified affecting left nondominant side: Secondary | ICD-10-CM | POA: Diagnosis not present

## 2020-11-19 DIAGNOSIS — M25562 Pain in left knee: Secondary | ICD-10-CM | POA: Diagnosis not present

## 2020-11-21 ENCOUNTER — Ambulatory Visit: Payer: Medicare Other

## 2020-11-21 ENCOUNTER — Ambulatory Visit: Payer: Medicare Other | Admitting: Occupational Therapy

## 2020-11-23 ENCOUNTER — Ambulatory Visit: Payer: Medicare Other

## 2020-11-24 ENCOUNTER — Other Ambulatory Visit: Payer: Self-pay

## 2020-11-24 ENCOUNTER — Ambulatory Visit: Payer: Medicare Other

## 2020-11-24 ENCOUNTER — Ambulatory Visit: Payer: Medicare Other | Admitting: Occupational Therapy

## 2020-11-24 ENCOUNTER — Telehealth: Payer: Self-pay

## 2020-11-24 DIAGNOSIS — R262 Difficulty in walking, not elsewhere classified: Secondary | ICD-10-CM

## 2020-11-24 DIAGNOSIS — I69354 Hemiplegia and hemiparesis following cerebral infarction affecting left non-dominant side: Secondary | ICD-10-CM

## 2020-11-24 DIAGNOSIS — R2681 Unsteadiness on feet: Secondary | ICD-10-CM | POA: Diagnosis not present

## 2020-11-24 DIAGNOSIS — R2689 Other abnormalities of gait and mobility: Secondary | ICD-10-CM

## 2020-11-24 DIAGNOSIS — M6281 Muscle weakness (generalized): Secondary | ICD-10-CM

## 2020-11-24 DIAGNOSIS — M25612 Stiffness of left shoulder, not elsewhere classified: Secondary | ICD-10-CM | POA: Diagnosis not present

## 2020-11-24 DIAGNOSIS — M25512 Pain in left shoulder: Secondary | ICD-10-CM | POA: Diagnosis not present

## 2020-11-24 DIAGNOSIS — R278 Other lack of coordination: Secondary | ICD-10-CM

## 2020-11-24 NOTE — Therapy (Signed)
[facility] [address] [place], [place], [address] Phone: [phone]   Fax:  [phone]  Physical Therapy Treatment  Patient Details  Name: Nicole [name] MRN: [id number] Date of Birth: May 05, 1930 Referring Provider (PT): Courtney Heys, MD   Encounter Date: 11/24/2020   PT End of Session - 11/24/20 1104    Visit Number 9    Number of Visits 17    Date for PT Re-Evaluation 01/10/21   POC for 8 weeks, Cert for 90 days   Authorization Type BCBS Medicare (10th Visit PN)    Progress Note Due on Visit 10    PT Start Time 1102    PT Stop Time 1144    PT Time Calculation (min) 42 min    Equipment Utilized During Treatment Gait belt    Activity Tolerance Patient tolerated treatment well;No increased pain    Behavior During Therapy WFL for tasks assessed/performed           Past Medical History:  Diagnosis Date  . Hypertension   . Stroke [facility])    September 2021  . Thyroid disease     History reviewed. No pertinent surgical history.  There were no vitals filed for this visit.   Subjective Assessment - 11/24/20 1103    Subjective No new changes since last visit. No falls to report. No pain.    Patient is accompained by: Family member    Pertinent History R Basal Ganglia CVA, Hyperlipidemia, Hypothyroidism, Asthma, Glaucoma, HTN    Limitations Standing;Walking    Patient Stated Goals Be able to walk;    Currently in Pain? No/denies                             [facility] Adult PT Treatment/Exercise - 11/24/20 0001      Transfers   Transfers Sit to Stand;Stand to Sit;Stand Pivot Transfers    Sit to Stand 4: Min guard;With upper extremity assist    Sit to Stand Details Tactile cues for weight shifting;Tactile cues for placement;Verbal cues for sequencing;Verbal cues for technique;Verbal cues for precautions/safety;Verbal cues for safe use of DME/AE;Manual facilitation for weight shifting    Stand to  Sit 4: Min guard;With upper extremity assist;To bed;To chair/3-in-1    Stand to Sit Details (indicate cue type and reason) Tactile cues for weight shifting;Verbal cues for sequencing;Verbal cues for technique;Verbal cues for precautions/safety;Verbal cues for safe use of DME/AE;Manual facilitation for weight shifting    Comments improved carryover noted with hand pplacement with completion of sit <> stand today.      Ambulation/Gait   Ambulation/Gait Yes    Ambulation/Gait Assistance 4: Min guard    Ambulation/Gait Assistance Details completed ambulation with L Anterior Ottobok donned, 2 x 115 ft. Patient require rest break between walking  due to fatigue. Mild knee pain rated as 4-5/10 after ambulation. Knee pain improved throughout gait, reporting reduced stiffness. PT providing verbal cues for increased step length on RLE with ambulation. CGA throughout with ambulation. Compelted gait x 50 ft with working on turns and aligning with mat prior to descent, increased verbal cues required with this.    Ambulation Distance (Feet) 115 Feet   x 2, 50 x 1   Assistive device Rolling walker    Gait Pattern Step-to pattern;Step-through pattern;Decreased step length - right;Decreased step length - left;Decreased stance time - left;Decreased hip/knee flexion - left;Decreased stride length;Decreased weight shift to left;Left flexed knee in stance;Antalgic;Trunk  flexed;Narrow base of support    Ambulation Surface Level;Indoor      Neuro Re-ed    Neuro Re-ed Details  In // bars: completed standing activity working toward improved weight shift/stance on LLE, completed steps forward/backward with RLE over measuring stick, completed 2 x 10 reps. PT providing manual faciliation at pelvis for improved weight shift onto LLE and verbal cues for improved posture with completion. Progressed to completed steps fwd/back with LLE x 10 reps, with primary focus on improved hip/knee flexion and clearance over measuring stick,  intermittent Min A required with this due to fatigue at end of session. Standing at [place] completed standing tolerance activity, working toward reduced UE support, patient able to stand 1 minute without UE support and close CGA x 2 reps. intermittent seated rest breaks required.                  PT Education - 11/24/20 1210    Education Details PT provided patient education on when [place] from [employer] will be present (Feb 2nd). PT provided note to caregiver to inform family member.    Person(s) Educated Patient;Caregiver(s)    Methods Explanation;Handout    Comprehension Verbalized understanding            PT Short Term Goals - 11/16/20 0954      PT SHORT TERM GOAL #1   Title Patient will be independent with initial HEP with caregiver assistance (ALL STGS Due: 11/09/20)    Baseline reviewed on 11/16/20    Time 4    Period Weeks    Status Partially Met    Target Date 11/09/20      PT SHORT TERM GOAL #2   Title Patient will demo ability to compelte all bed mobility with CGA to demonstrate improved independence with functional mobility    Baseline supervision, takes incr time  on 11/16/20    Time 4    Period Weeks    Status Achieved      PT SHORT TERM GOAL #3   Title Patient will demo ability to ambulate >= 30 ft with RW and CGA to demonstrate improved household ambulation    Baseline 5 ft, 115' with RW and  min guard/min A at times for RW steering    Time 4    Period Weeks    Status Partially Met      PT SHORT TERM GOAL #4   Title Patient will demo ability to complete sit <> stand with min A and RW to demonstrate improved functional mobility    Baseline Min - Mod A, min guard on 11/16/20    Time 4    Period Weeks    Status Achieved             PT Long Term Goals - 10/12/20 9390      PT LONG TERM GOAL #1   Title Patient will be independent with final HEP with caregiver assistance (ALL LTGs Due: 12/07/20)    Baseline no HEP established    Time 8    Period Weeks     Status New    Target Date 12/07/20      PT LONG TERM GOAL #2   Title Patient will be able to ambulate >/= 115 ft on indoor level surfaces with CGA and LRAD to demonstrate improved household mobility    Baseline 5 ft    Time 8    Period Weeks    Status New      PT LONG TERM  GOAL #3   Title Patient will demo ability to complete sit <> stand  and stand pivot transfer with CGA with RW to demosntrate improved independence with functional mobility    Baseline Min - Mod A    Time 8    Period Weeks    Status New      PT LONG TERM GOAL #4   Title LTG to be set for TUG when able to assess    Baseline TBA    Time 8    Period Weeks    Status New                 Plan - 11/24/20 1212    Clinical Impression Statement Continued gait training today working on improved ambulation distance, and weight shift to LLE. Patient still requiring intermittent rest breaks due to fatigue. Rest of session focused on NMR for improved stance/weight shift on LLE and balance with reduced UE support. patient tolerating well, will continue to progress toward all unmet goals.    Personal Factors and Comorbidities Comorbidity 3+;Time since onset of injury/illness/exacerbation;Age    Comorbidities R Basal Ganglia CVA, Hyperlipidemia, Hypothyroidism, Asthma, Glaucoma, HTN    Examination-Activity Limitations Bed Mobility;Dressing;Locomotion Level;Reach Overhead;Stairs;Stand;Transfers    Examination-Participation Restrictions Armed forces logistics/support/administrative officer Evolving/Moderate complexity    Rehab Potential Fair    PT Frequency 2x / week    PT Duration 8 weeks    PT Treatment/Interventions ADLs/Self Care Home Management;Cryotherapy;Electrical Stimulation;Moist Heat;DME Instruction;Gait training;Stair training;Functional mobility training;Therapeutic activities;Therapeutic exercise;Balance training;Neuromuscular re-education;Patient/family education;Orthotic Fit/Training;Manual  techniques;Passive range of motion    PT Next Visit Plan Complete 10th Visit PN. continue to work on LE strengthening.  continue with use of anterior Ottobock brace. [name] will be present on Feb 2nd at 9:30 with [place]. Continue to work on standing posture and weight shifitng.    PT Home Exercise Plan Access Code: [email or web address]    Consulted and Agree with Plan of Care Patient           Patient will benefit from skilled therapeutic intervention in order to improve the following deficits and impairments:  Abnormal gait,Decreased balance,Decreased mobility,Decreased endurance,Difficulty walking,Impaired tone,Impaired sensation,Pain,Decreased strength,Decreased safety awareness,Decreased knowledge of use of DME,Decreased coordination,Decreased activity tolerance,Decreased range of motion  Visit Diagnosis: Hemiplegia and hemiparesis following cerebral infarction affecting left non-dominant side (HCC)  Muscle weakness (generalized)  Unsteadiness on feet  Other abnormalities of gait and mobility  Difficulty in walking, not elsewhere classified     Problem List Patient Active Problem List   Diagnosis Date Noted  . Wheelchair dependence 10/07/2020  . Left knee pain 07/26/2020  . Cerebrovascular accident (CVA) of right basal ganglia ([place]) 07/26/2020  . Left hemiparesis ([place]) 07/25/2020  . Hypokalemia 07/20/2020  . Right basal ganglia embolic stroke ([place]) 00/34/9179  . Essential hypertension 07/20/2020  . Hypothyroidism 07/20/2020    Jones Bales, PT, DPT 11/24/2020, 12:22 PM  [facility] [address] [place], [place], [address] Phone: [phone]   Fax:  [phone]  Name: CHEA MALAN MRN: [id number] Date of Birth: 12/22/1929

## 2020-11-24 NOTE — Therapy (Signed)
[facility] [address] [place], [place], [address] Phone: [phone]   Fax:  [phone]  Occupational Therapy Treatment  Patient Details  Name: Nicole [name] MRN: [id number] Date of Birth: 10-30-1930 Referring Provider (OT): Genice Rouge, MD   Encounter Date: 11/24/2020   OT End of Session - 11/24/20 1258    Visit Number 5    Number of Visits 17    Date for OT Re-Evaluation 01/04/21    Authorization Type BCBS Medicare    Authorization Time Period $40 Copay VL:MN No Auth required  10th visit PN    Authorization - Visit Number 5    Authorization - Number of Visits 10    Progress Note Due on Visit 10    OT Start Time 1150    OT Stop Time 1230    OT Time Calculation (min) 40 min    Activity Tolerance Patient tolerated treatment well    Behavior During Therapy [facility] for tasks assessed/performed           Past Medical History:  Diagnosis Date  . Hypertension   . Stroke [facility])    September 2021  . Thyroid disease     No past surgical history on file.  There were no vitals filed for this visit.   Subjective Assessment - 11/24/20 1258    Subjective  Pt denies any pain.    Patient is accompanied by: --   caregiver   Pertinent History CVA R basal ganglia, HTN, HLD, hypothyroidism, asthma, glaucoma    Limitations fall risk. hard of hearing    Currently in Pain? No/denies                  Treatment: Sidelying shoulder flexion with UE ranger with therapist providing facilitation/ v.c Supine closed chain chest press and shoulder flexion with PVC pipe frame mod facilitation/ v.c  Seated flipping playing cards, placing clothespins on rack and placing/ removing dowel pegs with LUE for increased funectional use, mod v.c and facilitation to avoid compensatory pattern, min difficulty.                OT Short Term Goals - 11/24/20 1204      OT SHORT TERM GOAL #1   Title Pt will be independent  with HEP (12/07/20)    Time 4    Period Weeks    Status On-going    Target Date 12/07/20      OT SHORT TERM GOAL #2   Title Pt will improve grip strength in LUE by 3 lbs or greater for increase in functional use of LUE and increase independence with clothing management.    Baseline RUE 42.1 LUE 26.8    Time 4    Period Weeks    Status New      OT SHORT TERM GOAL #3   Title Pt will obtain item from mid level shelf with increase in shoulder flexion to 70 degrees with good positioning and reduction in shoulder hiking and compensatory movements.    Baseline LUE 66 degrees sh flexion    Time 4    Period Weeks    Status New      OT SHORT TERM GOAL #4   Title Pt will demonstrate improved box and blocks score to 35 or greater with LUE for increase in functional use of LUE.    Baseline 29 LUE    Time 4    Period Weeks    Status New  OT SHORT TERM GOAL #5   Title Pt will verbalize understanding of sleep positions for LUE to decrease pain.    Baseline 6/10 LUE shoulder pain at night    Time 4    Period Weeks    Status New      OT SHORT TERM GOAL #6   Title Pt will don bra with mod I    Baseline daughter currently assisting    Time 4    Period Weeks    Status New      OT SHORT TERM GOAL #7   Time 4    Period Weeks    Status New             OT Long Term Goals - 11/09/20 1604      OT LONG TERM GOAL #1   Title Pt will be independent with updated HEP 01/04/2021    Time 8    Period Weeks    Status New    Target Date 01/04/21      OT LONG TERM GOAL #2   Title Pt will demonstrate increased grip strength in LUE to 33 lbs or greater for improved functional use.    Baseline RUE 42.1 LUE 26.8    Time 8    Period Weeks    Status New      OT LONG TERM GOAL #3   Title Pt will perform UB and LB dressing with mod I.    Time 8    Period Weeks    Status New      OT LONG TERM GOAL #4   Title Pt will increase fine motor coordination by completing 9 hole peg test in 50  seconds or less with LUE.    Baseline RUE 27.47 LUE 61.87    Time 8    Period Weeks    Status New      OT LONG TERM GOAL #5   Title Pt will improve shoulder flexion to at least 100 degrees in order to obtain object and increase functional reach.    Baseline LUE 66 degrees sh flexion    Time 8    Period Weeks    Status New      OT LONG TERM GOAL #6   Title Pt will report decrease in pain in LUE shoulder at night to less than or equal to 4/10 with improved sleep positioning and range of motion    Time 8    Period Weeks    Status New                 Plan - 11/24/20 1151    Clinical Impression Statement Pt with good movement with tactile cueing this day and mod cues/support in sidelying and supine.    OT Occupational Profile and History Problem Focused Assessment - Including review of records relating to presenting problem    Occupational performance deficits (Please refer to evaluation for details): ADL's;IADL's    Body Structure / Function / Physical Skills ADL;Balance;Coordination;IADL;Pain;GMC;Hearing;Strength;Tone;Sensation;Dexterity;Decreased knowledge of use of DME;FMC;UE functional use;ROM;Flexibility    Cognitive Skills Attention;Sequencing;Problem Solve    OT Frequency 2x / week    OT Duration 8 weeks    OT Treatment/Interventions Self-care/ADL training;Cryotherapy;Ultrasound;Moist Heat;Electrical Stimulation;Paraffin;Energy conservation;DME and/or AE instruction;Manual Therapy;Functional Mobility Training;Neuromuscular education;Therapeutic exercise;Visual/perceptual remediation/compensation;Patient/family education;Balance training;Therapeutic activities;Passive range of motion;Cognitive remediation/compensation    Plan review HEP's, scapula mobility (? sidelying), AA/ROM along slightly elevated surface seated, low range reaching/coordination    Consulted and Agree with Plan of Care Family  member/caregiver;Patient    Family Member Consulted caregiever Pam            Patient will benefit from skilled therapeutic intervention in order to improve the following deficits and impairments:   Body Structure / Function / Physical Skills: ADL,Balance,Coordination,IADL,Pain,GMC,Hearing,Strength,Tone,Sensation,Dexterity,Decreased knowledge of use of DME,FMC,UE functional use,ROM,Flexibility Cognitive Skills: Attention,Sequencing,Problem Solve     Visit Diagnosis: Muscle weakness (generalized)  Unsteadiness on feet  Stiffness of left shoulder, not elsewhere classified  Other lack of coordination    Problem List Patient Active Problem List   Diagnosis Date Noted  . Wheelchair dependence 10/07/2020  . Left knee pain 07/26/2020  . Cerebrovascular accident (CVA) of right basal ganglia (HCC) 07/26/2020  . Left hemiparesis (HCC) 07/25/2020  . Hypokalemia 07/20/2020  . Right basal ganglia embolic stroke (HCC) 07/20/2020  . Essential hypertension 07/20/2020  . Hypothyroidism 07/20/2020    Alila Sotero 11/24/2020, 12:59 PM  [facility] [address] [place], [place], [address] Phone: [phone]   Fax:  [phone]  Name: Nicole [name] MRN: [id number] Date of Birth: 08/05/1930

## 2020-11-24 NOTE — Telephone Encounter (Signed)
Dr. Berline Chough,    Thank you for placing the order for the L AFO for Nicole [name]. We are in the process of obtaining Nicole [name] a new AFO to allow for improved ambulation within the home. Nicole [name] is scheduled to have a follow up visit with you on Wednesday January 26th. If possible during your face to face visit with this patient, would you please document medical necessity for this AFO to allow for insurance coverage. If any questions, I am happy to assist.   Thank you,  Adelfa Koh, PT, DPT   [facility] [address] [place], [place]  [address] Phone:  [phone] Fax:  [phone]

## 2020-11-24 NOTE — Patient Instructions (Addendum)
Thayer Ohm (representative from [facility]) will be present at appointment on February 2nd at 9:30. Please bring patient's brace to this appointment.

## 2020-11-25 ENCOUNTER — Ambulatory Visit: Payer: Medicare Other

## 2020-11-28 ENCOUNTER — Ambulatory Visit: Payer: Medicare Other | Admitting: Physical Therapy

## 2020-11-28 ENCOUNTER — Other Ambulatory Visit: Payer: Self-pay

## 2020-11-28 ENCOUNTER — Ambulatory Visit: Payer: Medicare Other | Admitting: Occupational Therapy

## 2020-11-28 ENCOUNTER — Encounter: Payer: Self-pay | Admitting: Occupational Therapy

## 2020-11-28 DIAGNOSIS — R278 Other lack of coordination: Secondary | ICD-10-CM

## 2020-11-28 DIAGNOSIS — M25612 Stiffness of left shoulder, not elsewhere classified: Secondary | ICD-10-CM

## 2020-11-28 DIAGNOSIS — I69354 Hemiplegia and hemiparesis following cerebral infarction affecting left non-dominant side: Secondary | ICD-10-CM | POA: Diagnosis not present

## 2020-11-28 DIAGNOSIS — R2681 Unsteadiness on feet: Secondary | ICD-10-CM

## 2020-11-28 DIAGNOSIS — R2689 Other abnormalities of gait and mobility: Secondary | ICD-10-CM | POA: Diagnosis not present

## 2020-11-28 DIAGNOSIS — M6281 Muscle weakness (generalized): Secondary | ICD-10-CM

## 2020-11-28 DIAGNOSIS — M25512 Pain in left shoulder: Secondary | ICD-10-CM | POA: Diagnosis not present

## 2020-11-28 DIAGNOSIS — R262 Difficulty in walking, not elsewhere classified: Secondary | ICD-10-CM | POA: Diagnosis not present

## 2020-11-28 NOTE — Therapy (Signed)
[facility] [address] [place], [place], [address] Phone: [phone]   Fax:  [phone]  Occupational Therapy Treatment  Patient Details  Name: Nicole [name] MRN: [id number] Date of Birth: [birth year] Referring Provider (OT): Genice Rouge, MD   Encounter Date: 11/28/2020   OT End of Session - 11/28/20 1154    Visit Number 6    Number of Visits 17    Date for OT Re-Evaluation 01/04/21    Authorization Type BCBS Medicare    Authorization Time Period $40 Copay VL:MN No Auth required  10th visit PN    Authorization - Visit Number 6    Authorization - Number of Visits 10    Progress Note Due on Visit 10    OT Start Time 1152    OT Stop Time 1230    OT Time Calculation (min) 38 min    Activity Tolerance Patient tolerated treatment well    Behavior During Therapy [facility] for tasks assessed/performed           Past Medical History:  Diagnosis Date  . Hypertension   . Stroke [facility])    September 2021  . Thyroid disease     History reviewed. No pertinent surgical history.  There were no vitals filed for this visit.   Subjective Assessment - 11/28/20 1154    Subjective  Pt denies any pain today. Says she's "fine"    Patient is accompanied by: --   caregiver   Pertinent History CVA R basal ganglia, HTN, HLD, hypothyroidism, asthma, glaucoma    Limitations fall risk. hard of hearing    Currently in Pain? No/denies                        OT Treatments/Exercises (OP) - 11/28/20 1157      Neurological Re-education Exercises   Other Exercises 1 table slides x 10 shoulder flexion and horizontal abduction and circumduction. Pt with mod factilitation for relaxing traps and decreasing compensatory movements    Other Exercises 2 pt required mod to max tactile facilitation for excessive shoulder hiking and closed shoulder for reaching activities. Pt with some benefit with hard block to R trunk to decrease  compensatory lateral felxion for shoulder flexion.      Functional Reaching Activities   Low Level worked on reach with semi circle pegboard with cylindrical pegs and with red/yellow clothespins with mod/max facilitation for weakness and shoulder hiking with LUE    Mid Level worked on incline with clothespins with AAROM with mod/max facilitation                    OT Short Term Goals - 11/24/20 1204      OT SHORT TERM GOAL #1   Title Pt will be independent with HEP (12/07/20)    Time 4    Period Weeks    Status On-going    Target Date 12/07/20      OT SHORT TERM GOAL #2   Title Pt will improve grip strength in LUE by 3 lbs or greater for increase in functional use of LUE and increase independence with clothing management.    Baseline RUE 42.1 LUE 26.8    Time 4    Period Weeks    Status New      OT SHORT TERM GOAL #3   Title Pt will obtain item from mid level shelf with increase in shoulder flexion to 70 degrees with good positioning  and reduction in shoulder hiking and compensatory movements.    Baseline LUE 66 degrees sh flexion    Time 4    Period Weeks    Status New      OT SHORT TERM GOAL #4   Title Pt will demonstrate improved box and blocks score to 35 or greater with LUE for increase in functional use of LUE.    Baseline 29 LUE    Time 4    Period Weeks    Status New      OT SHORT TERM GOAL #5   Title Pt will verbalize understanding of sleep positions for LUE to decrease pain.    Baseline 6/10 LUE shoulder pain at night    Time 4    Period Weeks    Status New      OT SHORT TERM GOAL #6   Title Pt will don bra with mod I    Baseline daughter currently assisting    Time 4    Period Weeks    Status New      OT SHORT TERM GOAL #7   Time 4    Period Weeks    Status New             OT Long Term Goals - 11/09/20 1604      OT LONG TERM GOAL #1   Title Pt will be independent with updated HEP 01/04/2021    Time 8    Period Weeks    Status New     Target Date 01/04/21      OT LONG TERM GOAL #2   Title Pt will demonstrate increased grip strength in LUE to 33 lbs or greater for improved functional use.    Baseline RUE 42.1 LUE 26.8    Time 8    Period Weeks    Status New      OT LONG TERM GOAL #3   Title Pt will perform UB and LB dressing with mod I.    Time 8    Period Weeks    Status New      OT LONG TERM GOAL #4   Title Pt will increase fine motor coordination by completing 9 hole peg test in 50 seconds or less with LUE.    Baseline RUE 27.47 LUE 61.87    Time 8    Period Weeks    Status New      OT LONG TERM GOAL #5   Title Pt will improve shoulder flexion to at least 100 degrees in order to obtain object and increase functional reach.    Baseline LUE 66 degrees sh flexion    Time 8    Period Weeks    Status New      OT LONG TERM GOAL #6   Title Pt will report decrease in pain in LUE shoulder at night to less than or equal to 4/10 with improved sleep positioning and range of motion    Time 8    Period Weeks    Status New                 Plan - 11/28/20 1158    Clinical Impression Statement Pt continues to require cueing for use of LUE but is making imrpovement with movement and functional use    OT Occupational Profile and History Problem Focused Assessment - Including review of records relating to presenting problem    Occupational performance deficits (Please refer to evaluation for details): ADL's;IADL's  Body Structure / Function / Physical Skills ADL;Balance;Coordination;IADL;Pain;GMC;Hearing;Strength;Tone;Sensation;Dexterity;Decreased knowledge of use of DME;FMC;UE functional use;ROM;Flexibility    Cognitive Skills Attention;Sequencing;Problem Solve    OT Frequency 2x / week    OT Duration 8 weeks    OT Treatment/Interventions Self-care/ADL training;Cryotherapy;Ultrasound;Moist Heat;Electrical Stimulation;Paraffin;Energy conservation;DME and/or AE instruction;Manual Therapy;Functional Mobility  Training;Neuromuscular education;Therapeutic exercise;Visual/perceptual remediation/compensation;Patient/family education;Balance training;Therapeutic activities;Passive range of motion;Cognitive remediation/compensation    Plan review HEP's, scapula mobility (? sidelying), AA/ROM along slightly elevated surface seated, low range reaching/coordination    Consulted and Agree with Plan of Care Family member/caregiver;Patient    Family Member Consulted caregiever Pam           Patient will benefit from skilled therapeutic intervention in order to improve the following deficits and impairments:   Body Structure / Function / Physical Skills: ADL,Balance,Coordination,IADL,Pain,GMC,Hearing,Strength,Tone,Sensation,Dexterity,Decreased knowledge of use of DME,FMC,UE functional use,ROM,Flexibility Cognitive Skills: Attention,Sequencing,Problem Solve     Visit Diagnosis: Muscle weakness (generalized)  Unsteadiness on feet  Stiffness of left shoulder, not elsewhere classified  Other lack of coordination  Hemiplegia and hemiparesis following cerebral infarction affecting left non-dominant side (HCC)  Other abnormalities of gait and mobility  Acute pain of left shoulder    Problem List Patient Active Problem List   Diagnosis Date Noted  . Wheelchair dependence 10/07/2020  . Left knee pain 07/26/2020  . Cerebrovascular accident (CVA) of right basal ganglia (HCC) 07/26/2020  . Left hemiparesis (HCC) 07/25/2020  . Hypokalemia 07/20/2020  . Right basal ganglia embolic stroke (HCC) 07/20/2020  . Essential hypertension 07/20/2020  . Hypothyroidism 07/20/2020    Junious Dresser MOT, OTR/L  11/28/2020, 12:37 PM  [facility] [address] [place], [place], [address] Phone: [phone]   Fax:  [phone]  Name: MANASA SPEASE MRN: [id number] Date of Birth: 09/01/1930

## 2020-11-28 NOTE — Therapy (Signed)
[facility] [address] [place], [place], [address] Phone: [phone]   Fax:  [phone]  Physical Therapy Treatment  Patient Details  Name: Nicole [name] MRN: [id number] Date of Birth: [birth year] Referring Provider (PT): Courtney Heys, MD  10th Visit Physical Therapy Progress Note  Dates of Reporting Period: 10/12/20 to 11/28/20       Encounter Date: 11/28/2020   PT End of Session - 11/28/20 1320    Visit Number 10    Number of Visits 17    Date for PT Re-Evaluation 01/10/21   POC for 8 weeks, Cert for 90 days   Authorization Type BCBS Medicare (10th Visit PN)    Progress Note Due on Visit 10    PT Start Time 1232    PT Stop Time 1314    PT Time Calculation (min) 42 min    Equipment Utilized During Treatment Gait belt    Activity Tolerance Patient tolerated treatment well    Behavior During Therapy [facility] for tasks assessed/performed           Past Medical History:  Diagnosis Date  . Hypertension   . Stroke [facility])    September 2021  . Thyroid disease     No past surgical history on file.  There were no vitals filed for this visit.   Subjective Assessment - 11/28/20 1242    Subjective No changes since she was last here.    Patient is accompained by: Family member    Pertinent History R Basal Ganglia CVA, Hyperlipidemia, Hypothyroidism, Asthma, Glaucoma, HTN    Limitations Standing;Walking    Patient Stated Goals Be able to walk;    Currently in Pain? No/denies                             [facility] Adult PT Treatment/Exercise - 11/28/20 0001      Transfers   Transfers Sit to Stand;Stand to Sit;Stand Pivot Transfers    Sit to Stand 4: Min guard;With upper extremity assist    Sit to Stand Details Tactile cues for weight shifting;Tactile cues for placement;Verbal cues for sequencing;Verbal cues for technique;Verbal cues for precautions/safety;Verbal cues for safe use of DME/AE;Manual  facilitation for weight shifting    Stand to Sit 4: Min guard;With upper extremity assist;To bed;To chair/3-in-1    Stand to Sit Details (indicate cue type and reason) Tactile cues for weight shifting;Verbal cues for sequencing;Verbal cues for technique;Verbal cues for precautions/safety;Verbal cues for safe use of DME/AE;Manual facilitation for weight shifting    Comments approx 5-6 reps of sit <> stands performed throughout session from mat table and w/c      Ambulation/Gait   Ambulation/Gait Yes    Ambulation/Gait Assistance 4: Min assist;4: Min guard    Ambulation/Gait Assistance Details performed with L anterior ottobock AFO, cues for incr step length with RLE with pt demonstrating improvements with incr distance. during initial bout of gait pt requiring 2 instances of min A for balance due to pt reporting incr L knee pain    Ambulation Distance (Feet) 100 Feet   x1, 15 x 1   Assistive device Rolling walker    Gait Pattern Step-to pattern;Step-through pattern;Decreased step length - right;Decreased step length - left;Decreased stance time - left;Decreased hip/knee flexion - left;Decreased stride length;Decreased weight shift to left;Left flexed knee in stance;Antalgic;Trunk flexed;Narrow base of support    Ambulation Surface Level;Indoor    Gait Comments standing at RW:  tapping to colorful floor dot x10 reps with RLE for incr step length and weight shift towards LLE, then performed x10 reps with LLE towards dot for improved foot placement, x10 reps stepping RLE over boomwhacker on floor and back to midline for incr stance time on LLE, cues for tall posture and knee extension with stance leg. pt with genu valgum and incr B knee flexion in standing      Neuro Re-ed    Neuro Re-ed Details  --      Exercises   Exercises Other Exercises      Knee/Hip Exercises: Stretches   Passive Hamstring Stretch Left;3 reps;30 seconds    Passive Hamstring Stretch Limitations with L foot propped on a 4"  block, verbal and demo cues for proper technique      Knee/Hip Exercises: Seated   Heel Slides Strengthening;AROM;Left;1 set;10 reps    Heel Slides Limitations with pillowcase under foot to decr friction, cues for full ROM when performing    Abd/Adduction Limitations seated hip ABD B x10 reps with therapist resistance                  PT Education - 11/28/20 1319    Education Details pt with caregiver Olin Hauser today - wrote down instructions for whoever takes pt to appt on 1/26 with Dr. Dagoberto Ligas to make sure it is documented and that they mention Cesia's need for a new L AFO.    Person(s) Educated Patient;Caregiver(s)    Methods Explanation;Handout    Comprehension Verbalized understanding            PT Short Term Goals - 11/16/20 0954      PT SHORT TERM GOAL #1   Title Patient will be independent with initial HEP with caregiver assistance (ALL STGS Due: 11/09/20)    Baseline reviewed on 11/16/20    Time 4    Period Weeks    Status Partially Met    Target Date 11/09/20      PT SHORT TERM GOAL #2   Title Patient will demo ability to compelte all bed mobility with CGA to demonstrate improved independence with functional mobility    Baseline supervision, takes incr time  on 11/16/20    Time 4    Period Weeks    Status Achieved      PT SHORT TERM GOAL #3   Title Patient will demo ability to ambulate >= 30 ft with RW and CGA to demonstrate improved household ambulation    Baseline 5 ft, 115' with RW and  min guard/min A at times for RW steering    Time 4    Period Weeks    Status Partially Met      PT SHORT TERM GOAL #4   Title Patient will demo ability to complete sit <> stand with min A and RW to demonstrate improved functional mobility    Baseline Min - Mod A, min guard on 11/16/20    Time 4    Period Weeks    Status Achieved             PT Long Term Goals - 10/12/20 1749      PT LONG TERM GOAL #1   Title Patient will be independent with final HEP with caregiver  assistance (ALL LTGs Due: 12/07/20)    Baseline no HEP established    Time 8    Period Weeks    Status New    Target Date 12/07/20      PT  LONG TERM GOAL #2   Title Patient will be able to ambulate >/= 115 ft on indoor level surfaces with CGA and LRAD to demonstrate improved household mobility    Baseline 5 ft    Time 8    Period Weeks    Status New      PT LONG TERM GOAL #3   Title Patient will demo ability to complete sit <> stand  and stand pivot transfer with CGA with RW to demosntrate improved independence with functional mobility    Baseline Min - Mod A    Time 8    Period Weeks    Status New      PT LONG TERM GOAL #4   Title LTG to be set for TUG when able to assess    Baseline TBA    Time 8    Period Weeks    Status New                 Plan - 11/28/20 1333    Clinical Impression Statement 10TH VISIT PROGRESS NOTE: STGs assessed on 11/16/20 with pt achieving goal for bed mobility - able to perform with supervision and pt now consistently able to perform sit <> stands with min guard. Today pt able to ambulate a total of 115' with min guard and occassional instances of min A for balance due to pt reporting L knee pain.  No other reports of knee pain throughout session today. Pt able to demonstrate improved step length with RLE and is performing a more consistent step through pattern with incr distances with gait. In the process of obtaining a L anterior ottobock AFO for pt to improve knee stability, gait mechanics, and balance. Pt to see Dr. Dagoberto Ligas this Wednesday 11/30/20 for face to face visit. Will continue to progress towards LTGs.    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 Dagoberto Ligas (is need for AFO documented?) continue to work on LE strengthening.  continue with use of anterior Ottobock brace. [name] will be present on Feb 2nd at 9:30 with Erling Arrazola. Continue to work on standing posture and weight shifitng.    PT Home Exercise Plan Access Code: [email or web address]    Consulted and Agree with Plan of Care Patient           Patient will benefit from skilled therapeutic intervention in order to improve the following deficits and impairments:  Abnormal gait,Decreased balance,Decreased mobility,Decreased endurance,Difficulty walking,Impaired tone,Impaired sensation,Pain,Decreased strength,Decreased safety awareness,Decreased knowledge of use of DME,Decreased coordination,Decreased activity tolerance,Decreased range of motion  Visit Diagnosis: Muscle weakness (generalized)  Unsteadiness on feet  Other lack of coordination  Hemiplegia and hemiparesis following cerebral infarction affecting left non-dominant side [facility])     Problem List Patient Active Problem List   Diagnosis Date Noted  . Wheelchair dependence 10/07/2020  . Left knee pain 07/26/2020  . Cerebrovascular accident (CVA) of right basal ganglia ([place]) 07/26/2020  . Left  hemiparesis ([place]) 07/25/2020  . Hypokalemia 07/20/2020  . Right basal ganglia embolic stroke ([place]) 32/00/3794  . Essential hypertension 07/20/2020  . Hypothyroidism  07/20/2020    Arliss Journey, PT ,DPT  11/28/2020, 1:34 PM  [facility] [address] [place], [place], [address] Phone: [phone]   Fax:  [phone]  Name: Nicole [name] MRN: [id number] Date of Birth: 1930/01/13

## 2020-11-30 ENCOUNTER — Encounter: Payer: Medicare Other | Attending: Registered Nurse | Admitting: Physical Medicine and Rehabilitation

## 2020-11-30 ENCOUNTER — Other Ambulatory Visit: Payer: Self-pay

## 2020-11-30 ENCOUNTER — Encounter: Payer: Self-pay | Admitting: Physical Medicine and Rehabilitation

## 2020-11-30 VITALS — BP 161/75 | HR 82 | Temp 98.3°F | Ht 64.0 in | Wt 150.0 lb

## 2020-11-30 DIAGNOSIS — Z0000 Encounter for general adult medical examination without abnormal findings: Secondary | ICD-10-CM | POA: Diagnosis not present

## 2020-11-30 DIAGNOSIS — R252 Cramp and spasm: Secondary | ICD-10-CM | POA: Diagnosis not present

## 2020-11-30 DIAGNOSIS — G8929 Other chronic pain: Secondary | ICD-10-CM | POA: Insufficient documentation

## 2020-11-30 DIAGNOSIS — M25562 Pain in left knee: Secondary | ICD-10-CM

## 2020-11-30 DIAGNOSIS — R5383 Other fatigue: Secondary | ICD-10-CM | POA: Diagnosis not present

## 2020-11-30 DIAGNOSIS — I6381 Other cerebral infarction due to occlusion or stenosis of small artery: Secondary | ICD-10-CM | POA: Insufficient documentation

## 2020-11-30 DIAGNOSIS — I10 Essential (primary) hypertension: Secondary | ICD-10-CM | POA: Diagnosis not present

## 2020-11-30 DIAGNOSIS — E039 Hypothyroidism, unspecified: Secondary | ICD-10-CM | POA: Diagnosis not present

## 2020-11-30 DIAGNOSIS — G8194 Hemiplegia, unspecified affecting left nondominant side: Secondary | ICD-10-CM | POA: Insufficient documentation

## 2020-11-30 DIAGNOSIS — R7302 Impaired glucose tolerance (oral): Secondary | ICD-10-CM | POA: Diagnosis not present

## 2020-11-30 DIAGNOSIS — E785 Hyperlipidemia, unspecified: Secondary | ICD-10-CM | POA: Diagnosis not present

## 2020-11-30 MED ORDER — BACLOFEN 10 MG PO TABS
10.0000 mg | ORAL_TABLET | Freq: Three times a day (TID) | ORAL | 5 refills | Status: DC
Start: 2020-11-30 — End: 2021-01-13

## 2020-11-30 NOTE — Progress Notes (Signed)
Subjective:    Patient ID: Nicole [name], female    DOB: Jul 29, 1930, 87 y.o.   MRN: [id number]  HPI Pt is an 87 yr old female with CVA of R basal ganglia with L hemiparesis here for f/u.    Doing "fine".   Still weak on L side- L leg is much weaker than the L arm.   Pain sometimes- Mainly pain in L knee-  Still bothering her.   Had gel put into L knee- 3 weeks in a row- ~ 1 month ago- cortisone wasn't helping anymore.   Still using a RW to walk.   Outpt PT- 2x/week. Doing well- Has L foot drop.    Since got off the stroke study- no more problems with frequent urination/urgency. Is now resolved  Pain worse at night- Muscle tightness is better to the same- not worse.   Is taking Baclofen 3x/day.  Not in pill tray per daughter.    To get up and down stairs- very difficult- [employer] cancelled order.    Pain Inventory Average Pain 6 Pain Right Now 0 My pain is aching  LOCATION OF PAIN  knee  BOWEL Number of stools per week: 7 Oral laxative use No  Type of laxative n/a Enema or suppository use No  History of colostomy No  Incontinent No   BLADDER Normal In and out cath, frequency n/a Able to self cath n/a Bladder incontinence No  Frequent urination No  Leakage with coughing No  Difficulty starting stream No  Incomplete bladder emptying No    Mobility walk with assistance how many minutes can you walk? 7 ability to climb steps?  no do you drive?  no use a wheelchair transfers alone Do you have any goals in this area?  yes  Function retired I need assistance with the following:  bathing, meal prep, household duties and shopping  Neuro/Psych trouble walking  Prior Studies Any changes since last visit?  no  Physicians involved in your care Any changes since last visit?  no   Family History  Problem Relation Age of Onset  . Diabetes Mother   . Stroke Mother   . Diabetes Other   . Hypertension Other    Social History   Socioeconomic  History  . Marital status: Single    Spouse name: Not on file  . Number of children: Not on file  . Years of education: Not on file  . Highest education level: Not on file  Occupational History  . Not on file  Tobacco Use  . Smoking status: Never Smoker  . Smokeless tobacco: Never Used  Vaping Use  . Vaping Use: Never used  Substance and Sexual Activity  . Alcohol use: No    Alcohol/week: 0.0 standard drinks  . Drug use: No  . Sexual activity: Not on file  Other Topics Concern  . Not on file  Social History Narrative   Lives with daughter   Right handed   Drinks 1-2 cups caffeine daily   Social Determinants of Health   Financial Resource Strain: Not on file  Food Insecurity: Not on file  Transportation Needs: Not on file  Physical Activity: Not on file  Stress: Not on file  Social Connections: Not on file   History reviewed. No pertinent surgical history. Past Medical History:  Diagnosis Date  . Hypertension   . Stroke [facility])    September 2021  . Thyroid disease    BP (!) 161/75   Pulse 82  Temp 98.3 F (36.8 C)   Ht 5\' 4"  (1.626 m)   Wt 150 lb (68 kg)   SpO2 93%   BMI 25.75 kg/m   Opioid Risk Score:   Fall Risk Score:  `1  Depression screen PHQ 2/9  Depression screen PHQ 2/9 08/26/2020  Decreased Interest 1  Down, Depressed, Hopeless 1  PHQ - 2 Score 2  Altered sleeping 1  Tired, decreased energy 1  Change in appetite 3  Feeling bad or failure about yourself  3  Trouble concentrating 0  Moving slowly or fidgety/restless 0  Suicidal thoughts 1  PHQ-9 Score 11    Review of Systems  Constitutional: Negative.   HENT: Negative.   Eyes: Negative.   Respiratory: Negative.   Cardiovascular: Negative.   Gastrointestinal: Negative.   Endocrine: Negative.   Musculoskeletal: Positive for gait problem.  Skin: Negative.   Allergic/Immunologic: Negative.   Hematological: Negative.   Psychiatric/Behavioral: Negative.   All other systems reviewed  and are negative.      Objective:   Physical Exam Awake, alert, Hard of hearing, accompanied by daughter, in transport w/c, NAD MS: LUE- deltoid 2/5, bicep 4-/5, triceps 4+/5, grip 4 to 4+/5, finger abd 3+/5 RLE- HF 2+/5, KE 2-/5, DF 2-/5, and PF 2-/5   Neuro: MAS of 1+ to 2 in L elbow and L wrist- and MAS of 2 mainly cath at end of range that is ~ 45 degrees worth of catch LLE- MAS of 2-3 in L knee-  Still forgetting to use LLE and LUE  Mild foot ankle swelling on LLE- 1+ or so.      Assessment & Plan:   Pt is an 87 yr old female with CVA of R basal ganglia with L hemiparesis here for f/u.   1. Discussed Dantrolene vs increasing Baclofen - with chance of increased sleepiness/constipation- decided to increase Baclofen for now- but if makes her too sleepy, then will try Dantrolene.   2. Needs L AFO- due to L foot drop from stroke- absolutely needs a L AFO for DF and PF that is 2- to 2/5 at best.   3. Will increase Baclofen to 10 mg 3x/day for now.  For spasticity/muscle tightness  4. Do ROM 5-6x/day  To reduce the need for increasing Baclofen even more right now.   5. Don't think pt quite at the point she needs Botox, but she might at next appointment- will reassess at next appointment  6. F/U 6 weeks.   I spent a total  Of 30 minutes on visit- as detailed above.

## 2020-11-30 NOTE — Patient Instructions (Signed)
Pt is an 87 yr old female with CVA of R basal ganglia with L hemiparesis here for f/u.   1. Discussed Dantrolene vs increasing Baclofen - with chance of increased sleepiness/constipation- decided to increase Baclofen for now- but if makes her too sleepy, then will try Dantrolene.   2. Needs L AFO- due to L foot drop from stroke- absolutely needs a L AFO for DF and PF that is 2- to 2/5 at best.   3. Will increase Baclofen to 10 mg 3x/day for now.  For spasticity/muscle tightness  4. Do ROM 5-6x/day  To reduce the need for increasing Baclofen even more right now.   5. Don't think pt quite at the point she needs Botox, but she might at next appointment- will reassess at next appointment  6. F/U 6 weeks.

## 2020-12-02 ENCOUNTER — Encounter: Payer: Self-pay | Admitting: Occupational Therapy

## 2020-12-02 ENCOUNTER — Encounter: Payer: Self-pay | Admitting: Physical Therapy

## 2020-12-02 ENCOUNTER — Ambulatory Visit: Payer: Medicare Other | Admitting: Occupational Therapy

## 2020-12-02 ENCOUNTER — Ambulatory Visit: Payer: Medicare Other | Admitting: Physical Therapy

## 2020-12-02 ENCOUNTER — Other Ambulatory Visit: Payer: Self-pay

## 2020-12-02 DIAGNOSIS — M6281 Muscle weakness (generalized): Secondary | ICD-10-CM | POA: Diagnosis not present

## 2020-12-02 DIAGNOSIS — R2681 Unsteadiness on feet: Secondary | ICD-10-CM

## 2020-12-02 DIAGNOSIS — R2689 Other abnormalities of gait and mobility: Secondary | ICD-10-CM

## 2020-12-02 DIAGNOSIS — I69354 Hemiplegia and hemiparesis following cerebral infarction affecting left non-dominant side: Secondary | ICD-10-CM

## 2020-12-02 DIAGNOSIS — R278 Other lack of coordination: Secondary | ICD-10-CM | POA: Diagnosis not present

## 2020-12-02 DIAGNOSIS — M25612 Stiffness of left shoulder, not elsewhere classified: Secondary | ICD-10-CM

## 2020-12-02 DIAGNOSIS — M25512 Pain in left shoulder: Secondary | ICD-10-CM

## 2020-12-02 DIAGNOSIS — R262 Difficulty in walking, not elsewhere classified: Secondary | ICD-10-CM | POA: Diagnosis not present

## 2020-12-02 NOTE — Therapy (Addendum)
[facility] [address] [place], [place], [address] Phone: [phone]   Fax:  [phone]  Occupational Therapy Treatment  Patient Details  Name: Nicole [name] MRN: [id number] Date of Birth: 03-07-1930 Referring Provider (OT): Genice Rouge, MD   Encounter Date: 12/02/2020   OT End of Session - 12/02/20 0932    Visit Number 7    Number of Visits 17    Date for OT Re-Evaluation 01/04/21    Authorization Type BCBS Medicare    Authorization Time Period $40 Copay VL:MN No Auth required  10th visit PN    Authorization - Visit Number 7    Authorization - Number of Visits 10    Progress Note Due on Visit 10    OT Start Time 0931    OT Stop Time 1015    OT Time Calculation (min) 44 min    Activity Tolerance Patient tolerated treatment well    Behavior During Therapy [facility] for tasks assessed/performed           Past Medical History:  Diagnosis Date  . Hypertension   . Stroke [facility])    September 2021  . Thyroid disease     History reviewed. No pertinent surgical history.  There were no vitals filed for this visit.   Subjective Assessment - 12/02/20 0932    Subjective  Pt denies any pain. Pt says she's doing fine.    Patient is accompanied by: --   caregiver   Pertinent History CVA R basal ganglia, HTN, HLD, hypothyroidism, asthma, glaucoma    Limitations fall risk. hard of hearing    Currently in Pain? No/denies                        OT Treatments/Exercises (OP) - 12/02/20 5027      Neurological Re-education Exercises   Other Information lots of scap work this day in side lying and sitting over physioball. joint mobilization provided with STM for increasing overall mobility and range of LUE scapula.    Other Exercises 1 supine x 10 shoulder flexion with foam roller - pt req'd facilitation for elbow extension and for some support at elbow for overal muscle weakness    Other Exercises 2 hemi glide  with LUE with forward reach - pro/retr x 10 with mod facilitation for good movement patterns    Other Weight-Bearing Exercises 1 in LUE hand and forearm while seated edge of mat    Trunk Exercises Rotation    Trunk Rotation x 4 trunk rotation while seated egde of mat for dissociation of trunk and hip for increased mobility and mechanics      Functional Reaching Activities   Low Level low reach for cones with LUE while seated edge of mat. Pt required mod facilitation for good movement patterns and decreasing compesnatory strategies for lateral flexion and shoulder hiking for decreased strength in LUE shoulder.                    OT Short Term Goals - 11/24/20 1204      OT SHORT TERM GOAL #1   Title Pt will be independent with HEP (12/07/20)    Time 4    Period Weeks    Status On-going    Target Date 12/07/20      OT SHORT TERM GOAL #2   Title Pt will improve grip strength in LUE by 3 lbs or greater for increase in functional use  of LUE and increase independence with clothing management.    Baseline RUE 42.1 LUE 26.8    Time 4    Period Weeks    Status New      OT SHORT TERM GOAL #3   Title Pt will obtain item from mid level shelf with increase in shoulder flexion to 70 degrees with good positioning and reduction in shoulder hiking and compensatory movements.    Baseline LUE 66 degrees sh flexion    Time 4    Period Weeks    Status New      OT SHORT TERM GOAL #4   Title Pt will demonstrate improved box and blocks score to 35 or greater with LUE for increase in functional use of LUE.    Baseline 29 LUE    Time 4    Period Weeks    Status New      OT SHORT TERM GOAL #5   Title Pt will verbalize understanding of sleep positions for LUE to decrease pain.    Baseline 6/10 LUE shoulder pain at night    Time 4    Period Weeks    Status New      OT SHORT TERM GOAL #6   Title Pt will don bra with mod I    Baseline daughter currently assisting    Time 4    Period Weeks     Status New      OT SHORT TERM GOAL #7   Time 4    Period Weeks    Status New             OT Long Term Goals - 11/09/20 1604      OT LONG TERM GOAL #1   Title Pt will be independent with updated HEP 01/04/2021    Time 8    Period Weeks    Status New    Target Date 01/04/21      OT LONG TERM GOAL #2   Title Pt will demonstrate increased grip strength in LUE to 33 lbs or greater for improved functional use.    Baseline RUE 42.1 LUE 26.8    Time 8    Period Weeks    Status New      OT LONG TERM GOAL #3   Title Pt will perform UB and LB dressing with mod I.    Time 8    Period Weeks    Status New      OT LONG TERM GOAL #4   Title Pt will increase fine motor coordination by completing 9 hole peg test in 50 seconds or less with LUE.    Baseline RUE 27.47 LUE 61.87    Time 8    Period Weeks    Status New      OT LONG TERM GOAL #5   Title Pt will improve shoulder flexion to at least 100 degrees in order to obtain object and increase functional reach.    Baseline LUE 66 degrees sh flexion    Time 8    Period Weeks    Status New      OT LONG TERM GOAL #6   Title Pt will report decrease in pain in LUE shoulder at night to less than or equal to 4/10 with improved sleep positioning and range of motion    Time 8    Period Weeks    Status New  Plan - 12/02/20 1051    Clinical Impression Statement pt actively participating in therapy session. scap mobilization beneficial for increasing overall mobility of LUE and developing normal movement patterns and decreasing compensatory movements.    OT Occupational Profile and History Problem Focused Assessment - Including review of records relating to presenting problem    Occupational performance deficits (Please refer to evaluation for details): ADL's;IADL's    Body Structure / Function / Physical Skills ADL;Balance;Coordination;IADL;Pain;GMC;Hearing;Strength;Tone;Sensation;Dexterity;Decreased knowledge of  use of DME;FMC;UE functional use;ROM;Flexibility    Cognitive Skills Attention;Sequencing;Problem Solve    OT Frequency 2x / week    OT Duration 8 weeks    OT Treatment/Interventions Self-care/ADL training;Cryotherapy;Ultrasound;Moist Heat;Electrical Stimulation;Paraffin;Energy conservation;DME and/or AE instruction;Manual Therapy;Functional Mobility Training;Neuromuscular education;Therapeutic exercise;Visual/perceptual remediation/compensation;Patient/family education;Balance training;Therapeutic activities;Passive range of motion;Cognitive remediation/compensation    Plan review HEP's, scapula mobility (? sidelying), AA/ROM along slightly elevated surface seated, low range reaching/coordination    Consulted and Agree with Plan of Care Family member/caregiver;Patient    Family Member Consulted caregiever Pam           Patient will benefit from skilled therapeutic intervention in order to improve the following deficits and impairments:   Body Structure / Function / Physical Skills: ADL,Balance,Coordination,IADL,Pain,GMC,Hearing,Strength,Tone,Sensation,Dexterity,Decreased knowledge of use of DME,FMC,UE functional use,ROM,Flexibility Cognitive Skills: Attention,Sequencing,Problem Solve     Visit Diagnosis: Muscle weakness (generalized)  Unsteadiness on feet  Stiffness of left shoulder, not elsewhere classified  Other lack of coordination  Hemiplegia and hemiparesis following cerebral infarction affecting left non-dominant side ([facility])  Other abnormalities of gait and mobility  Acute pain of left shoulder    Problem List Patient Active Problem List   Diagnosis Date Noted  . Spasticity 11/30/2020  . Wheelchair dependence 10/07/2020  . Left knee pain 07/26/2020  . Cerebrovascular accident (CVA) of right basal ganglia ([facility]) 07/26/2020  . Left hemiparesis ([facility]) 07/25/2020  . Hypokalemia 07/20/2020  . Right basal ganglia embolic stroke ([facility]) 07/20/2020  . Essential hypertension  07/20/2020  . Hypothyroidism 07/20/2020    Junious Dresser MOT, OTR/L  12/02/2020, 10:56 AM  [facility] [address] [place], [place], [address] Phone: [phone]   Fax:  [phone]  Name: TAUNYA GORAL MRN: [id number] Date of Birth: 07/02/1930

## 2020-12-04 NOTE — Therapy (Signed)
[facility] [address] [place], [place], [address] Phone: [phone]   Fax:  [phone]  Physical Therapy Treatment  Patient Details  Name: Nicole [name] MRN: [id number] Date of Birth: 13-Mar-1930 Referring Provider (PT): Courtney Heys, MD   Encounter Date: 12/02/2020    12/02/20 0855  PT Visits / Re-Eval  Visit Number 11  Number of Visits 17  Date for PT Re-Evaluation 01/10/21 (POC for 8 weeks, Cert for 90 days)  Authorization  Authorization Type BCBS Medicare (10th Visit PN)  Progress Note Due on Visit 20  PT Time Calculation  PT Start Time 0850  PT Stop Time 0930  PT Time Calculation (min) 40 min  PT - End of Session  Equipment Utilized During Treatment Gait belt  Activity Tolerance Patient tolerated treatment well  Behavior During Therapy [facility] for tasks assessed/performed     Past Medical History:  Diagnosis Date  . Hypertension   . Stroke [facility])    September 2021  . Thyroid disease     History reviewed. No pertinent surgical history.  There were no vitals filed for this visit.     12/02/20 0853  Symptoms/Limitations  Subjective No new complaints. No falls or pain to report. Saw Dr. Dagoberto Ligas. She increased her Bacolfen to help with muscle tightness/spasticity and pt does need Left AFO (this was put in her note for the face to face required by insurance).  Pertinent History R Basal Ganglia CVA, Hyperlipidemia, Hypothyroidism, Asthma, Glaucoma, HTN  Limitations Standing;Walking  Patient Stated Goals Be able to walk;  Pain Assessment  Currently in Pain? No/denies  Pain Score 0      12/02/20 0856  Transfers  Transfers Sit to Stand;Stand to Sit;Stand Pivot Transfers  Sit to Stand 4: Min guard;With upper extremity assist  Sit to Stand Details Tactile cues for weight shifting;Tactile cues for placement;Verbal cues for sequencing;Verbal cues for technique;Verbal cues for precautions/safety;Verbal  cues for safe use of DME/AE;Manual facilitation for weight shifting  Stand to Sit 4: Min guard;With upper extremity assist;To bed;To chair/3-in-1  Stand to Sit Details (indicate cue type and reason) Tactile cues for weight shifting;Verbal cues for sequencing;Verbal cues for technique;Verbal cues for precautions/safety;Verbal cues for safe use of DME/AE;Manual facilitation for weight shifting  Ambulation/Gait  Ambulation/Gait Yes  Ambulation/Gait Assistance 4: Min guard;4: Min assist  Ambulation/Gait Assistance Details continued with use of left anterior Ottobock brace with gait with cues for equal step length and stance time. on balance issues noted iwth gait today. occasional min assist needed for walker management with turns.  Ambulation Distance (Feet) 115 Feet  Assistive device Rolling walker;Other (Comment) (left anterior ottobock brace)  Gait Pattern Step-to pattern;Step-through pattern;Decreased step length - right;Decreased step length - left;Decreased stance time - left;Decreased hip/knee flexion - left;Decreased stride length;Decreased weight shift to left;Left flexed knee in stance;Antalgic;Trunk flexed;Narrow base of support  Ambulation Surface Level;Indoor  Stairs Yes  Stairs Assistance 4: Min assist  Stairs Assistance Details (indicate cue type and reason) in parallel bars with 4 inch step, bil UE support with cues on sequencing and technique to step up/then down the step.  Number of Stairs 1 (x2 reps)  Height of Stairs 4  Self-Care  Self-Care Other Self-Care Comments  Other Self-Care Comments  daughter asking about power chair for pt to use when home alone as she is not getting to bathroom on time. discussd use of BSC and goal of PT is to get her walking therefore power chair may not be  covered by her insurance. will discuss with primary PT.  Knee/Hip Exercises: Aerobic  Other Aerobic Scifit level 1.5 for 5 minutes with UE/LE's for strengthening and activity tolerance, cues to go  through full range of motion.         PT Short Term Goals - 11/16/20 0954      PT SHORT TERM GOAL #1   Title Patient will be independent with initial HEP with caregiver assistance (ALL STGS Due: 11/09/20)    Baseline reviewed on 11/16/20    Time 4    Period Weeks    Status Partially Met    Target Date 11/09/20      PT SHORT TERM GOAL #2   Title Patient will demo ability to compelte all bed mobility with CGA to demonstrate improved independence with functional mobility    Baseline supervision, takes incr time  on 11/16/20    Time 4    Period Weeks    Status Achieved      PT SHORT TERM GOAL #3   Title Patient will demo ability to ambulate >= 30 ft with RW and CGA to demonstrate improved household ambulation    Baseline 5 ft, 115' with RW and  min guard/min A at times for RW steering    Time 4    Period Weeks    Status Partially Met      PT SHORT TERM GOAL #4   Title Patient will demo ability to complete sit <> stand with min A and RW to demonstrate improved functional mobility    Baseline Min - Mod A, min guard on 11/16/20    Time 4    Period Weeks    Status Achieved             PT Long Term Goals - 10/12/20 7948      PT LONG TERM GOAL #1   Title Patient will be independent with final HEP with caregiver assistance (ALL LTGs Due: 12/07/20)    Baseline no HEP established    Time 8    Period Weeks    Status New    Target Date 12/07/20      PT LONG TERM GOAL #2   Title Patient will be able to ambulate >/= 115 ft on indoor level surfaces with CGA and LRAD to demonstrate improved household mobility    Baseline 5 ft    Time 8    Period Weeks    Status New      PT LONG TERM GOAL #3   Title Patient will demo ability to complete sit <> stand  and stand pivot transfer with CGA with RW to demosntrate improved independence with functional mobility    Baseline Min - Mod A    Time 8    Period Weeks    Status New      PT LONG TERM GOAL #4   Title LTG to be set for TUG when  able to assess    Baseline TBA    Time 8    Period Weeks    Status New             12/02/20 0856  Plan  Clinical Impression Statement Today's skilled session continued to focus on strengthening, gait with brace and stair training with single step in parallel bars. Rest breaks needed due to fatigue, no other issues noted or reported in session. The pt is progressing toward goals and should benefit from continued PT to progress toward unmet goals.  Personal Factors  and Comorbidities Comorbidity 3+;Time since onset of injury/illness/exacerbation;Age  Comorbidities R Basal Ganglia CVA, Hyperlipidemia, Hypothyroidism, Asthma, Glaucoma, HTN  Examination-Activity Limitations Bed Mobility;Dressing;Locomotion Level;Reach Overhead;Stairs;Stand;Transfers  Examination-Participation Restrictions Community Activity;Cleaning  Pt will benefit from skilled therapeutic intervention in order to improve on the following deficits Abnormal gait;Decreased balance;Decreased mobility;Decreased endurance;Difficulty walking;Impaired tone;Impaired sensation;Pain;Decreased strength;Decreased safety awareness;Decreased knowledge of use of DME;Decreased coordination;Decreased activity tolerance;Decreased range of motion  Stability/Clinical Decision Making Evolving/Moderate complexity  Rehab Potential Fair  PT Frequency 2x / week  PT Duration 8 weeks  PT Treatment/Interventions ADLs/Self Care Home Management;Cryotherapy;Electrical Stimulation;Moist Heat;DME Instruction;Gait training;Stair training;Functional mobility training;Therapeutic activities;Therapeutic exercise;Balance training;Neuromuscular re-education;Patient/family education;Orthotic Fit/Training;Manual techniques;Passive range of motion  PT Next Visit Plan continue to work on LE strengthening.  continue with use of anterior Ottobock brace. [name] will be present on Feb 2nd at 9:30 with Chloe. Continue to work on standing posture and weight shifitng.  PT Home  Exercise Plan Access Code: [email or web address]  Consulted and Agree with Plan of Care Patient          Patient will benefit from skilled therapeutic intervention in order to improve the following deficits and impairments:  Abnormal gait,Decreased balance,Decreased mobility,Decreased endurance,Difficulty walking,Impaired tone,Impaired sensation,Pain,Decreased strength,Decreased safety awareness,Decreased knowledge of use of DME,Decreased coordination,Decreased activity tolerance,Decreased range of motion  Visit Diagnosis: Muscle weakness (generalized)  Unsteadiness on feet  Other abnormalities of gait and mobility  Hemiplegia and hemiparesis following cerebral infarction affecting left non-dominant side [facility])     Problem List Patient Active Problem List   Diagnosis Date Noted  . Spasticity 11/30/2020  . Wheelchair dependence 10/07/2020  . Left knee pain 07/26/2020  . Cerebrovascular accident (CVA) of right basal ganglia ([place]) 07/26/2020  . Left hemiparesis ([place]) 07/25/2020  . Hypokalemia 07/20/2020  . Right basal ganglia embolic stroke ([place]) 31/42/7670  . Essential hypertension 07/20/2020  . Hypothyroidism 07/20/2020    Willow Ora, PTA, [facility] [address] [place], [place] [address] [phone] 12/04/20, 6:00 PM   Name: JOSSILYN BENDA MRN: [id number] Date of Birth: [birth year]

## 2020-12-07 ENCOUNTER — Other Ambulatory Visit: Payer: Self-pay

## 2020-12-07 ENCOUNTER — Encounter: Payer: Self-pay | Admitting: Occupational Therapy

## 2020-12-07 ENCOUNTER — Ambulatory Visit: Payer: Medicare Other | Admitting: Occupational Therapy

## 2020-12-07 ENCOUNTER — Ambulatory Visit: Payer: Medicare Other | Attending: Physical Medicine and Rehabilitation | Admitting: Physical Therapy

## 2020-12-07 ENCOUNTER — Encounter: Payer: Self-pay | Admitting: Physical Therapy

## 2020-12-07 DIAGNOSIS — R2681 Unsteadiness on feet: Secondary | ICD-10-CM | POA: Diagnosis not present

## 2020-12-07 DIAGNOSIS — R2689 Other abnormalities of gait and mobility: Secondary | ICD-10-CM | POA: Insufficient documentation

## 2020-12-07 DIAGNOSIS — M25512 Pain in left shoulder: Secondary | ICD-10-CM

## 2020-12-07 DIAGNOSIS — M6281 Muscle weakness (generalized): Secondary | ICD-10-CM

## 2020-12-07 DIAGNOSIS — R278 Other lack of coordination: Secondary | ICD-10-CM

## 2020-12-07 DIAGNOSIS — M25612 Stiffness of left shoulder, not elsewhere classified: Secondary | ICD-10-CM | POA: Diagnosis not present

## 2020-12-07 DIAGNOSIS — R262 Difficulty in walking, not elsewhere classified: Secondary | ICD-10-CM | POA: Diagnosis not present

## 2020-12-07 DIAGNOSIS — I69354 Hemiplegia and hemiparesis following cerebral infarction affecting left non-dominant side: Secondary | ICD-10-CM

## 2020-12-07 NOTE — Therapy (Signed)
[facility] [address] [place], [place], [address] Phone: [phone]   Fax:  [phone]  Physical Therapy Treatment  Patient Details  Name: Nicole [name] MRN: [id number] Date of Birth: [birth year] Referring Provider (PT): Courtney Heys, MD   Encounter Date: 12/07/2020   PT End of Session - 12/07/20 1210    Visit Number 12    Number of Visits 17    Date for PT Re-Evaluation 01/10/21   POC for 8 weeks, Cert for 90 days   Authorization Type BCBS Medicare (10th Visit PN)    Progress Note Due on Visit 20    PT Start Time 0933    PT Stop Time 1014    PT Time Calculation (min) 41 min    Equipment Utilized During Treatment Gait belt    Activity Tolerance Patient tolerated treatment well    Behavior During Therapy [facility] for tasks assessed/performed           Past Medical History:  Diagnosis Date  . Hypertension   . Stroke [facility])    September 2021  . Thyroid disease     History reviewed. No pertinent surgical history.  There were no vitals filed for this visit.   Subjective Assessment - 12/07/20 0936    Subjective No changes since she was last here. No falls.    Pertinent History R Basal Ganglia CVA, Hyperlipidemia, Hypothyroidism, Asthma, Glaucoma, HTN    Limitations Standing;Walking    Patient Stated Goals Be able to walk;    Currently in Pain? No/denies                             [facility] Adult PT Treatment/Exercise - 12/07/20 0001      Transfers   Transfers Sit to Stand;Stand to Sit;Stand Pivot Transfers    Sit to Stand 4: Min guard;With upper extremity assist    Sit to Stand Details Verbal cues for sequencing;Verbal cues for technique    Stand to Sit 4: Min guard;With upper extremity assist;To bed;To chair/3-in-1    Stand to Sit Details (indicate cue type and reason) Verbal cues for sequencing;Verbal cues for technique;Verbal cues for precautions/safety    Stand to Sit Details cues to  reach back with one hand    Stand Pivot Transfers 4: Min guard    Stand Pivot Transfer Details (indicate cue type and reason) with RW, from w/c > mat table    Comments x5 reps sit <> stand from mat table with RW, plus additional reps performed throughout session      Ambulation/Gait   Ambulation/Gait Yes    Ambulation/Gait Assistance 4: Min guard;4: Min assist    Ambulation/Gait Assistance Details continued to use L anterior ottobock AFO with gait. pt initially begins with step to pattern and progresses to step through with incr gait distance. orthotist, chris, from [place] present at start of session and in agreement with new AFO for pt to improve safety with gait. pt's new AFO will be delivered to a PT session in approx. 2 weeks. needing min A intermittently to help with RW propulsion and one episode for balance    Ambulation Distance (Feet) 115 Feet   x2   Assistive device Rolling walker;Other (Comment)   L anterior ottobock AFO   Gait Pattern Step-to pattern;Step-through pattern;Decreased step length - right;Decreased step length - left;Decreased stance time - left;Decreased hip/knee flexion - left;Decreased stride length;Decreased weight shift to left;Left flexed knee in  stance;Antalgic;Trunk flexed;Narrow base of support    Ambulation Surface Level;Indoor      Neuro Re-ed    Neuro Re-ed Details  standing at RW: stepping LLE over yardstick x10 reps for improved hip/knee flexion, and an additional x10 reps stepping over with RLE for improved SLS time on LLE                  PT Education - 12/07/20 1209    Education Details [name], Customer service manager, present throughout session. Pt to receive new L AFO in approx. 2 weeks and will be delivered to PT session    Person(s) Educated Patient;Caregiver(s)    Methods Explanation    Comprehension Verbalized understanding            PT Short Term Goals - 11/16/20 0954      PT SHORT TERM GOAL #1   Title Patient will be independent with initial  HEP with caregiver assistance (ALL STGS Due: 11/09/20)    Baseline reviewed on 11/16/20    Time 4    Period Weeks    Status Partially Met    Target Date 11/09/20      PT SHORT TERM GOAL #2   Title Patient will demo ability to compelte all bed mobility with CGA to demonstrate improved independence with functional mobility    Baseline supervision, takes incr time  on 11/16/20    Time 4    Period Weeks    Status Achieved      PT SHORT TERM GOAL #3   Title Patient will demo ability to ambulate >= 30 ft with RW and CGA to demonstrate improved household ambulation    Baseline 5 ft, 115' with RW and  min guard/min A at times for RW steering    Time 4    Period Weeks    Status Partially Met      PT SHORT TERM GOAL #4   Title Patient will demo ability to complete sit <> stand with min A and RW to demonstrate improved functional mobility    Baseline Min - Mod A, min guard on 11/16/20    Time 4    Period Weeks    Status Achieved             PT Long Term Goals - 12/07/20 1211      PT LONG TERM GOAL #1   Title Patient will be independent with final HEP with caregiver assistance (ALL LTGs Due: 12/07/20)    Baseline no HEP established    Time 8    Period Weeks    Status New      PT LONG TERM GOAL #2   Title Patient will be able to ambulate >/= 115 ft on indoor level surfaces with CGA and LRAD to demonstrate improved household mobility    Baseline 115' with min guard/ocassional min A on 12/07/20 with RW    Time 8    Period Weeks    Status Achieved      PT LONG TERM GOAL #3   Title Patient will demo ability to complete sit <> stand  and stand pivot transfer with CGA with RW to demosntrate improved independence with functional mobility    Baseline Min - Mod A, performs with min guard with RW on 12/07/20    Time 8    Period Weeks    Status Achieved      PT LONG TERM GOAL #4   Title LTG to be set for TUG when able to assess  Baseline TBA    Time 8    Period Weeks    Status New                  Plan - 12/07/20 1218    Clinical Impression Statement Orthotist present at beginning of session - in agreement for use of L anterior ottobock AFO for improved gait mechanics and safety. Pt able to walk 2 laps today with RW with min guard and ocassional min A for RW steering and one episode for balance and partially met LTG #2. Pt also able to perform stand pivot transfers with RW and sit <> stands with min guard and met LTG #3. Will continue to progress towards LTGs.    Personal Factors and Comorbidities Comorbidity 3+;Time since onset of injury/illness/exacerbation;Age    Comorbidities R Basal Ganglia CVA, Hyperlipidemia, Hypothyroidism, Asthma, Glaucoma, HTN    Examination-Activity Limitations Bed Mobility;Dressing;Locomotion Level;Reach Overhead;Stairs;Stand;Transfers    Examination-Participation Restrictions Armed forces logistics/support/administrative officer Evolving/Moderate complexity    Rehab Potential Fair    PT Frequency 2x / week    PT Duration 8 weeks    PT Treatment/Interventions ADLs/Self Care Home Management;Cryotherapy;Electrical Stimulation;Moist Heat;DME Instruction;Gait training;Stair training;Functional mobility training;Therapeutic activities;Therapeutic exercise;Balance training;Neuromuscular re-education;Patient/family education;Orthotic Fit/Training;Manual techniques;Passive range of motion    PT Next Visit Plan need to check goals and perform re-cert. continue to work on LE strengthening.  continue with use of anterior Ottobock brace. Continue to work on standing posture and weight shifitng.    PT Home Exercise Plan Access Code: [email or web address]    Consulted and Agree with Plan of Care Patient           Patient will benefit from skilled therapeutic intervention in order to improve the following deficits and impairments:  Abnormal gait,Decreased balance,Decreased mobility,Decreased endurance,Difficulty walking,Impaired tone,Impaired  sensation,Pain,Decreased strength,Decreased safety awareness,Decreased knowledge of use of DME,Decreased coordination,Decreased activity tolerance,Decreased range of motion  Visit Diagnosis: Muscle weakness (generalized)  Unsteadiness on feet  Other lack of coordination  Hemiplegia and hemiparesis following cerebral infarction affecting left non-dominant side (HCC)  Other abnormalities of gait and mobility     Problem List Patient Active Problem List   Diagnosis Date Noted  . Spasticity 11/30/2020  . Wheelchair dependence 10/07/2020  . Left knee pain 07/26/2020  . Cerebrovascular accident (CVA) of right basal ganglia ([place]) 07/26/2020  . Left hemiparesis ([place]) 07/25/2020  . Hypokalemia 07/20/2020  . Right basal ganglia embolic stroke ([place]) 09/38/1829  . Essential hypertension 07/20/2020  . Hypothyroidism 07/20/2020    Arliss Journey, PT, DPT  12/07/2020, 12:20 PM  [facility] [address] [place], [place], [address] Phone: [phone]   Fax:  [phone]  Name: Nicole [name] MRN: [id number] Date of Birth: 20-Jun-1930

## 2020-12-07 NOTE — Therapy (Signed)
[facility] [address] [place], [place], [address] Phone: [phone]   Fax:  [phone]  Occupational Therapy Treatment  Patient Details  Name: Nicole [name] MRN: [id number] Date of Birth: [birth year] Referring Provider (OT): Genice Rouge, MD   Encounter Date: 12/07/2020   OT End of Session - 12/07/20 1017    Visit Number 8    Number of Visits 17    Date for OT Re-Evaluation 01/04/21    Authorization Type BCBS Medicare    Authorization Time Period $40 Copay VL:MN No Auth required  10th visit PN    Authorization - Visit Number 8    Authorization - Number of Visits 10    Progress Note Due on Visit 10    OT Start Time 1015    OT Stop Time 1055    OT Time Calculation (min) 40 min    Activity Tolerance Patient tolerated treatment well    Behavior During Therapy [facility] for tasks assessed/performed           Past Medical History:  Diagnosis Date  . Hypertension   . Stroke [facility])    September 2021  . Thyroid disease     History reviewed. No pertinent surgical history.  There were no vitals filed for this visit.   Subjective Assessment - 12/07/20 1017    Subjective  Pt says "doing fine" and denies any pain.    Patient is accompanied by: --   caregiver   Pertinent History CVA R basal ganglia, HTN, HLD, hypothyroidism, asthma, glaucoma    Limitations fall risk. hard of hearing    Currently in Pain? No/denies              [facility] OT Assessment - 12/07/20 1042      Coordination   Box and Blocks LUE 28      Hand Function   Left Hand Grip (lbs) 37.2                    OT Treatments/Exercises (OP) - 12/07/20 1021      Visual/Perceptual Exercises   Copy this Image Pegboard    Pegboard copied pattern with no difficulty      Neurological Re-education Exercises   Development of Reach Ball stretches    Ball Stretches x 10 in sitting    Trunk Exercises Rotation;Lateral Trunck Flexion    Trunk  Rotation x 15 to turn to left to grab cones and reaching with mod A at elbow to stack in front (therapist holding)    Lateral trunk flexion leaning over onto foream (bilaterally) x 5 and reaching across midline with opposite arm      Fine Motor Coordination (Hand/Wrist)   Fine Motor Coordination Large Pegboard    Large Pegboard medium pegs with LUE with lateral flexion to right for compensation of weakness in LUE shoulder but with fair coordination in LUE hand. Pt with minimal drops and moderate difficulty with coordination. Pt removed pegs with mod facilitation from therapist at right trunk to prevent compensation. Pt with better movement pattern with LUE with tactile cueing at right.                    OT Short Term Goals - 12/07/20 1032      OT SHORT TERM GOAL #1   Title Pt will be independent with HEP (12/07/20)    Time 4    Period Weeks    Status On-going    Target  Date 12/07/20      OT SHORT TERM GOAL #2   Title Pt will improve grip strength in LUE by 3 lbs or greater for increase in functional use of LUE and increase independence with clothing management.    Baseline RUE 42.1 LUE 26.8    Time 4    Period Weeks    Status Achieved   37.9 LUE     OT SHORT TERM GOAL #3   Title Pt will obtain item from mid level shelf with increase in shoulder flexion to 70 degrees with good positioning and reduction in shoulder hiking and compensatory movements.    Baseline LUE 66 degrees sh flexion    Time 4    Period Weeks    Status New      OT SHORT TERM GOAL #4   Title Pt will demonstrate improved box and blocks score to 35 or greater with LUE for increase in functional use of LUE.    Baseline 29 LUE    Time 4    Period Weeks    Status On-going   12/07/20 - 28 blocks     OT SHORT TERM GOAL #5   Title Pt will verbalize understanding of sleep positions for LUE to decrease pain.    Baseline 6/10 LUE shoulder pain at night    Time 4    Period Weeks    Status On-going      OT  SHORT TERM GOAL #6   Title Pt will don bra with mod I    Baseline daughter currently assisting    Time 4    Period Weeks    Status Achieved   12/07/20 - pt reports donning bra all clothing     OT SHORT TERM GOAL #7   Time --    Period --    Status --             OT Long Term Goals - 12/07/20 1032      OT LONG TERM GOAL #1   Title Pt will be independent with updated HEP 01/04/2021    Time 8    Period Weeks    Status New      OT LONG TERM GOAL #2   Title Pt will demonstrate increased grip strength in LUE to 33 lbs or greater for improved functional use.    Baseline RUE 42.1 LUE 26.8    Time 8    Period Weeks    Status Achieved   37.9 lbs LUE 12/07/20     OT LONG TERM GOAL #3   Title Pt will perform UB and LB dressing with mod I.    Time 8    Period Weeks    Status On-going      OT LONG TERM GOAL #4   Title Pt will increase fine motor coordination by completing 9 hole peg test in 50 seconds or less with LUE.    Baseline RUE 27.47 LUE 61.87    Time 8    Period Weeks    Status New      OT LONG TERM GOAL #5   Title Pt will improve shoulder flexion to at least 100 degrees in order to obtain object and increase functional reach.    Baseline LUE 66 degrees sh flexion    Time 8    Period Weeks    Status New      OT LONG TERM GOAL #6   Title Pt will report decrease in pain in LUE shoulder  at night to less than or equal to 4/10 with improved sleep positioning and range of motion    Time 8    Period Weeks    Status New                 Plan - 12/07/20 1044    Clinical Impression Statement pt continues to progress towards goals. pt has improved grip strength and meeting both STG and LTG.    OT Occupational Profile and History Problem Focused Assessment - Including review of records relating to presenting problem    Occupational performance deficits (Please refer to evaluation for details): ADL's;IADL's    Body Structure / Function / Physical Skills  ADL;Balance;Coordination;IADL;Pain;GMC;Hearing;Strength;Tone;Sensation;Dexterity;Decreased knowledge of use of DME;FMC;UE functional use;ROM;Flexibility    Cognitive Skills Attention;Sequencing;Problem Solve    OT Frequency 2x / week    OT Duration 8 weeks    OT Treatment/Interventions Self-care/ADL training;Cryotherapy;Ultrasound;Moist Heat;Electrical Stimulation;Paraffin;Energy conservation;DME and/or AE instruction;Manual Therapy;Functional Mobility Training;Neuromuscular education;Therapeutic exercise;Visual/perceptual remediation/compensation;Patient/family education;Balance training;Therapeutic activities;Passive range of motion;Cognitive remediation/compensation    Plan review HEP's, scapula mobility (? sidelying), AA/ROM along slightly elevated surface seated, low range reaching/coordination    Consulted and Agree with Plan of Care Family member/caregiver;Patient    Family Member Consulted caregiever Pam           Patient will benefit from skilled therapeutic intervention in order to improve the following deficits and impairments:   Body Structure / Function / Physical Skills: ADL,Balance,Coordination,IADL,Pain,GMC,Hearing,Strength,Tone,Sensation,Dexterity,Decreased knowledge of use of DME,FMC,UE functional use,ROM,Flexibility Cognitive Skills: Attention,Sequencing,Problem Solve     Visit Diagnosis: Muscle weakness (generalized)  Unsteadiness on feet  Stiffness of left shoulder, not elsewhere classified  Other lack of coordination  Hemiplegia and hemiparesis following cerebral infarction affecting left non-dominant side (HCC)  Other abnormalities of gait and mobility  Acute pain of left shoulder    Problem List Patient Active Problem List   Diagnosis Date Noted  . Spasticity 11/30/2020  . Wheelchair dependence 10/07/2020  . Left knee pain 07/26/2020  . Cerebrovascular accident (CVA) of right basal ganglia (HCC) 07/26/2020  . Left hemiparesis (HCC) 07/25/2020  .  Hypokalemia 07/20/2020  . Right basal ganglia embolic stroke (HCC) 07/20/2020  . Essential hypertension 07/20/2020  . Hypothyroidism 07/20/2020    Junious Dresser MOT, OTR/L  12/07/2020, 11:15 AM  [facility] [address] [place], [place], [address] Phone: [phone]   Fax:  [phone]  Name: Nicole [name] MRN: [id number] Date of Birth: Sep 09, 1930

## 2020-12-09 ENCOUNTER — Ambulatory Visit: Payer: Medicare Other | Admitting: Occupational Therapy

## 2020-12-09 ENCOUNTER — Encounter: Payer: Self-pay | Admitting: Occupational Therapy

## 2020-12-09 ENCOUNTER — Other Ambulatory Visit: Payer: Self-pay

## 2020-12-09 ENCOUNTER — Ambulatory Visit: Payer: Medicare Other | Admitting: Physical Therapy

## 2020-12-09 ENCOUNTER — Encounter: Payer: Self-pay | Admitting: Physical Therapy

## 2020-12-09 DIAGNOSIS — I69354 Hemiplegia and hemiparesis following cerebral infarction affecting left non-dominant side: Secondary | ICD-10-CM | POA: Diagnosis not present

## 2020-12-09 DIAGNOSIS — R262 Difficulty in walking, not elsewhere classified: Secondary | ICD-10-CM | POA: Diagnosis not present

## 2020-12-09 DIAGNOSIS — R278 Other lack of coordination: Secondary | ICD-10-CM

## 2020-12-09 DIAGNOSIS — M25512 Pain in left shoulder: Secondary | ICD-10-CM

## 2020-12-09 DIAGNOSIS — R2681 Unsteadiness on feet: Secondary | ICD-10-CM | POA: Diagnosis not present

## 2020-12-09 DIAGNOSIS — R2689 Other abnormalities of gait and mobility: Secondary | ICD-10-CM

## 2020-12-09 DIAGNOSIS — M25612 Stiffness of left shoulder, not elsewhere classified: Secondary | ICD-10-CM

## 2020-12-09 DIAGNOSIS — M6281 Muscle weakness (generalized): Secondary | ICD-10-CM

## 2020-12-09 NOTE — Patient Instructions (Signed)
Access Code: [email or web address] URL: [email or web address] Date: 12/09/2020 Prepared by: Sallyanne Kuster  Exercises Supine Bridge - 1 x daily - 5 x weekly - 2 sets - 5 reps Seated Hip Abduction with Resistance - 1 x daily - 5 x weekly - 2 sets - 10 reps Seated March with Resistance - 1 x daily - 5 x weekly - 1 sets - 10 reps Sit to Stand with Armchair - 1 x daily - 5 x weekly - 1 sets - 10 reps

## 2020-12-09 NOTE — Patient Instructions (Signed)
Flexion (Assistive)    Laying down with knees bent, hold Lt wrist with Rt hand (Lt thumb side up), keeping elbows as straight as possible and raise just above eye level (not as far as picture). Repeat __10__ times. Do __2__ sessions per day.   ELBOW: Extension / Chest Press (Frame)    Lie on back with knees bent. Hold paper towel roll, straighten elbows towards ceiling, then back to chest (chest press). Repeat  _10__ reps per set, _2__ sets per day. Do not tense shoulders  

## 2020-12-09 NOTE — Therapy (Signed)
[facility] [address] [place], [place], [address] Phone: [phone]   Fax:  [phone]  Occupational Therapy Treatment & Progress Note  Patient Details  Name: Nicole [name] MRN: [id number] Date of Birth: [birth year] Referring Provider (OT): Courtney Heys, MD   Encounter Date: 12/09/2020   OT End of Session - 12/09/20 0931    Visit Number 9    Number of Visits 17    Date for OT Re-Evaluation 01/04/21    Authorization Type BCBS Medicare    Authorization Time Period $40 Copay VL:MN No Auth required  10th visit PN    Authorization - Visit Number 9    Authorization - Number of Visits 10    Progress Note Due on Visit 10    OT Start Time 0931    OT Stop Time 1014    OT Time Calculation (min) 43 min    Activity Tolerance Patient tolerated treatment well    Behavior During Therapy [facility] for tasks assessed/performed           Past Medical History:  Diagnosis Date  . Hypertension   . Stroke [facility])    September 2021  . Thyroid disease     History reviewed. No pertinent surgical history.  There were no vitals filed for this visit.   Subjective Assessment - 12/09/20 0931    Subjective  Pt denies pain or changes.    Patient is accompanied by: --   caregiver   Pertinent History CVA R basal ganglia, HTN, HLD, hypothyroidism, asthma, glaucoma    Limitations fall risk. hard of hearing    Currently in Pain? No/denies              Occupational Therapy Progress Note  Dates of Reporting Period: 11/09/2020 to 12/09/2020  Objective Reports of Subjective Statement: Pt continues to report no pain or very minimal pain in LUE shoulder. Pt actively participates in therapy sessions and good support from daughter and caregiver.  Objective Measurements: Pt is making progress with low level reaching and decreasing overall compensation and guarding in LUE shoulder. Pt with improved trunk and hip dissociation and trunk rotation to  limit adducted LUE shoulder during reaching activities. Pt continues to sit with LUE fixed to trunk and with trunk flexion and shoulder hiking for compensating for decreased strength in LUE. Continuing to benefit from skilled OT to address coordination, normal movement patterns in LUE and NMR and strengthening.  Goal Update: Pt has met 4/6 STGs at time of progress note.  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  Reason Skilled Services are Required: To continue with skilled interventions for neuromuscular reeducation for increasing independence and decreasing caregiver burden.            OT Treatments/Exercises (OP) - 12/09/20 0944      Neurological Re-education Exercises   Development of Reach Reaching    Reaching to Waist forward reaching iwth hemi glide x 10 with LUE with min facilitation and support for weakness      Functional Reaching Activities   Low Level low reaching for placing and removing resistance clothespins with LUE. Pt with active shoulder flexion of approx 60* . P trequired several rest breaks secondary to fatgiue. Pt worked on reaching low level with connect four chips x 41 with emphasis on trunk rotation to left to grab chips and reach and increasing elbow extension and shoulder flexion for placing chips in board.  OT Education - 12/09/20 1018    Education Details re-issued handout for supine HEP    Person(s) Educated Patient;Child(ren)    Methods Explanation;Handout    Comprehension Verbalized understanding            OT Short Term Goals - 12/09/20 1032      OT SHORT TERM GOAL #1   Title Pt will be independent with HEP (12/07/20)    Time 4    Period Weeks    Status Achieved    Target Date 12/07/20      OT SHORT TERM GOAL #2   Title Pt will improve grip strength in LUE by 3 lbs or greater for increase in functional use of LUE and increase independence with clothing management.     Baseline RUE 42.1 LUE 26.8    Time 4    Period Weeks    Status Achieved   37.9 LUE     OT SHORT TERM GOAL #3   Title Pt will obtain item from mid level shelf with increase in shoulder flexion to 70 degrees with good positioning and reduction in shoulder hiking and compensatory movements.    Baseline LUE 66 degrees sh flexion    Time 4    Period Weeks    Status On-going      OT SHORT TERM GOAL #4   Title Pt will demonstrate improved box and blocks score to 35 or greater with LUE for increase in functional use of LUE.    Baseline 29 LUE    Time 4    Period Weeks    Status On-going   12/07/20 - 28 blocks     OT SHORT TERM GOAL #5   Title Pt will verbalize understanding of sleep positions for LUE to decrease pain.    Baseline 6/10 LUE shoulder pain at night    Time 4    Period Weeks    Status Achieved      OT SHORT TERM GOAL #6   Title Pt will don bra with mod I    Baseline daughter currently assisting    Time 4    Period Weeks    Status Achieved   12/07/20 - pt reports donning bra all clothing            OT Long Term Goals - 12/07/20 1032      OT LONG TERM GOAL #1   Title Pt will be independent with updated HEP 01/04/2021    Time 8    Period Weeks    Status New      OT LONG TERM GOAL #2   Title Pt will demonstrate increased grip strength in LUE to 33 lbs or greater for improved functional use.    Baseline RUE 42.1 LUE 26.8    Time 8    Period Weeks    Status Achieved   37.9 lbs LUE 12/07/20     OT LONG TERM GOAL #3   Title Pt will perform UB and LB dressing with mod I.    Time 8    Period Weeks    Status On-going      OT LONG TERM GOAL #4   Title Pt will increase fine motor coordination by completing 9 hole peg test in 50 seconds or less with LUE.    Baseline RUE 27.47 LUE 61.87    Time 8    Period Weeks    Status New      OT LONG TERM GOAL #5   Title  Pt will improve shoulder flexion to at least 100 degrees in order to obtain object and increase functional  reach.    Baseline LUE 66 degrees sh flexion    Time 8    Period Weeks    Status New      OT LONG TERM GOAL #6   Title Pt will report decrease in pain in LUE shoulder at night to less than or equal to 4/10 with improved sleep positioning and range of motion    Time 8    Period Weeks    Status New                 Plan - 12/09/20 1025    Clinical Impression Statement pt is progressing towards goals. pt has improved with LUE low level reach with decreasing compensation but continues to have guarding in LUE. Progress note for 10th visit progress from period 11/09/2020 - 12/09/2020. See progress note summary above.    OT Occupational Profile and History Problem Focused Assessment - Including review of records relating to presenting problem    Occupational performance deficits (Please refer to evaluation for details): ADL's;IADL's    Body Structure / Function / Physical Skills ADL;Balance;Coordination;IADL;Pain;GMC;Hearing;Strength;Tone;Sensation;Dexterity;Decreased knowledge of use of DME;FMC;UE functional use;ROM;Flexibility    Cognitive Skills Attention;Sequencing;Problem Solve    OT Frequency 2x / week    OT Duration 8 weeks    OT Treatment/Interventions Self-care/ADL training;Cryotherapy;Ultrasound;Moist Heat;Electrical Stimulation;Paraffin;Energy conservation;DME and/or AE instruction;Manual Therapy;Functional Mobility Training;Neuromuscular education;Therapeutic exercise;Visual/perceptual remediation/compensation;Patient/family education;Balance training;Therapeutic activities;Passive range of motion;Cognitive remediation/compensation    Plan scapula mobility, low level reaching    Consulted and Agree with Plan of Care Family member/caregiver;Patient    Family Member Consulted caregiever Pam           Patient will benefit from skilled therapeutic intervention in order to improve the following deficits and impairments:   Body Structure / Function / Physical Skills:  ADL,Balance,Coordination,IADL,Pain,GMC,Hearing,Strength,Tone,Sensation,Dexterity,Decreased knowledge of use of DME,FMC,UE functional use,ROM,Flexibility Cognitive Skills: Attention,Sequencing,Problem Solve     Visit Diagnosis: Muscle weakness (generalized)  Stiffness of left shoulder, not elsewhere classified  Other lack of coordination  Hemiplegia and hemiparesis following cerebral infarction affecting left non-dominant side (HCC)  Other abnormalities of gait and mobility  Acute pain of left shoulder    Problem List Patient Active Problem List   Diagnosis Date Noted  . Spasticity 11/30/2020  . Wheelchair dependence 10/07/2020  . Left knee pain 07/26/2020  . Cerebrovascular accident (CVA) of right basal ganglia ([place]) 07/26/2020  . Left hemiparesis ([place]) 07/25/2020  . Hypokalemia 07/20/2020  . Right basal ganglia embolic stroke ([place]) 41/36/4383  . Essential hypertension 07/20/2020  . Hypothyroidism 07/20/2020    Zachery Conch MOT, OTR/L  12/09/2020, 10:35 AM  [facility] [address] [place], [place], [address] Phone: [phone]   Fax:  [phone]  Name: Nicole [name] MRN: [id number] Date of Birth: May 28, 1930

## 2020-12-11 NOTE — Therapy (Signed)
[facility] [address] [place], [place], [address] Phone: [phone]   Fax:  [phone]  Physical Therapy Treatment/Re-Certification  Patient Details  Name: Nicole [name] MRN: [id number] Date of Birth: 05/11/1930 Referring Provider (PT): Megan Lovorn, MD   Encounter Date: 12/09/2020     12/09/20 0851  PT Visits / Re-Eval  Visit Number 13  Number of Visits 25  Date for PT Re-Evaluation 02/10/21 (POC for 6 weeks, Cert for 60 days)  Authorization  Authorization Type BCBS Medicare (10th Visit PN)  Progress Note Due on Visit 20  PT Time Calculation  PT Start Time 0848  PT Stop Time 0930  PT Time Calculation (min) 42 min  PT - End of Session  Equipment Utilized During Treatment Gait belt  Activity Tolerance Patient tolerated treatment well  Behavior During Therapy WFL for tasks assessed/performed     Past Medical History:  Diagnosis Date  . Hypertension   . Stroke (HCC)    September 2021  . Thyroid disease     History reviewed. No pertinent surgical history.  There were no vitals filed for this visit.     12/09/20 0850  Symptoms/Limitations  Subjective No new complaints. No falls or pain to report.  Patient is accompained by: Family member  Pertinent History R Basal Ganglia CVA, Hyperlipidemia, Hypothyroidism, Asthma, Glaucoma, HTN  Limitations Standing;Walking  Pain Assessment  Currently in Pain? No/denies  Pain Score 0      12/09/20 0852  Standardized Balance Assessment  Standardized Balance Assessment TUG;Five Times Sit to Stand;10 meter walk test  Five times sit to stand comments  35.97 sec's with UE assist from standard height chair.  10 Meter Walk 56.71 sec's= 0.58 ft/sec  Timed Up and Go Test  TUG Normal TUG  Normal TUG (seconds) 73.41 (sec's with RW/left AFO)      12/09/20 0852  Transfers  Transfers Sit to Stand;Stand to Sit;Stand Pivot Transfers  Sit to Stand 4: Min guard;With  upper extremity assist  Sit to Stand Details Verbal cues for sequencing;Verbal cues for technique  Stand to Sit 4: Min guard;With upper extremity assist;To bed;To chair/3-in-1  Stand to Sit Details (indicate cue type and reason) Verbal cues for sequencing;Verbal cues for technique;Verbal cues for precautions/safety  Ambulation/Gait  Ambulation/Gait Yes  Ambulation/Gait Assistance 4: Min guard;4: Min assist  Ambulation/Gait Assistance Details continued with use of brace for gait with cues for posture, step length and walker position with gait. most assitance needed with turns.  Ambulation Distance (Feet) 115 Feet (x1, plus around gym as needed)  Assistive device Rolling walker;Other (Comment) (left anterior Ottobock brace)  Gait Pattern Step-to pattern;Step-through pattern;Decreased step length - right;Decreased step length - left;Decreased stance time - left;Decreased hip/knee flexion - left;Decreased stride length;Decreased weight shift to left;Left flexed knee in stance;Antalgic;Trunk flexed;Narrow base of support  Ambulation Surface Level;Indoor  Self-Care  Self-Care Other Self-Care Comments  Other Self-Care Comments  Reviewed current [employer] HEP and advanced HEP this session. Refer to [employer] program for full details. Cues on technique and form. Min guard assist for safety.    Issued to HEP this session:  Access Code: [email or web address] URL: [email or web address] Date: 12/09/2020 Prepared by: Kathy Bury  Exercises Supine Bridge - 1 x daily - 5 x weekly - 2 sets - 5 reps Seated Hip Abduction with Resistance - 1 x daily - 5 x weekly - 2 sets - 10 reps Seated March with Resistance - 1 x daily - 5 x weekly - 1   sets - 10 reps Sit to Stand with Armchair - 1 x daily - 5 x weekly - 1 sets - 10 reps      12/11/20 2013  PT Education  Education Details Progress toward goals, baseline scores for new functional tests done today and updated HEP  Person(s) Educated  Patient;Child(ren) (daughter)  Methods Explanation;Demonstration;Verbal cues;Handout  Comprehension Verbalized understanding;Returned demonstration;Verbal cues required;Need further instruction       PT Education - 12/11/20 2013    Education Details Progress toward goals, baseline scores for new functional tests done today and updated HEP    Person(s) Educated Patient;Child(ren)   daughter   Methods Explanation;Demonstration;Verbal cues;Handout    Comprehension Verbalized understanding;Returned demonstration;Verbal cues required;Need further instruction            PT Short Term Goals - 11/16/20 0954      PT SHORT TERM GOAL #1   Title Patient will be independent with initial HEP with caregiver assistance (ALL STGS Due: 11/09/20)    Baseline reviewed on 11/16/20    Time 4    Period Weeks    Status Partially Met    Target Date 11/09/20      PT SHORT TERM GOAL #2   Title Patient will demo ability to compelte all bed mobility with CGA to demonstrate improved independence with functional mobility    Baseline supervision, takes incr time  on 11/16/20    Time 4    Period Weeks    Status Achieved      PT SHORT TERM GOAL #3   Title Patient will demo ability to ambulate >= 30 ft with RW and CGA to demonstrate improved household ambulation    Baseline 5 ft, 115' with RW and  min guard/min A at times for RW steering    Time 4    Period Weeks    Status Partially Met      PT SHORT TERM GOAL #4   Title Patient will demo ability to complete sit <> stand with min A and RW to demonstrate improved functional mobility    Baseline Min - Mod A, min guard on 11/16/20    Time 4    Period Weeks    Status Achieved             PT Long Term Goals - 12/09/20 2016      PT LONG TERM GOAL #1   Title Patient will be independent with final HEP with caregiver assistance (ALL LTGs Due: 12/07/20)    Baseline 12/09/20: current program was reviewed and updated this session, will benefit from further program  advancement as pt progresses    Status Achieved      PT LONG TERM GOAL #2   Title Patient will be able to ambulate >/= 115 ft on indoor level surfaces with CGA and LRAD to demonstrate improved household mobility    Baseline 12/07/20: 115' with min guard/ocassional min A  with RW    Status Achieved      PT LONG TERM GOAL #3   Title Patient will demo ability to complete sit <> stand  and stand pivot transfer with CGA with RW to demosntrate improved independence with functional mobility    Baseline 12/07/20: Min - Mod A, performs with min guard with RW    Status Achieved      PT LONG TERM GOAL #4   Title LTG to be set for TUG when able to assess    Baseline T2/4/22: 73.41 sec's with RW/brace on left   LE    Status Achieved           Updated Short Term Goals:   PT Short Term Goals - 12/12/20 0923      PT SHORT TERM GOAL #1   Title Patient/family member will report complteing household ambulation with RW at least 2x/day (All STGs Due: 01/02/21)    Baseline not currently ambulating in the home    Time 3    Period Weeks    Status Partially Met    Target Date 01/02/21      PT SHORT TERM GOAL #2   Title Patient will improve 5x sit <> stand to </= 30 seconds with UE support to demonstrate improved balance    Baseline 35.97 secs    Time 3    Period Weeks    Status New      PT SHORT TERM GOAL #3   Title Patient will improve TUG to </= to 68 seconds to demonstrate improved balance and reduced fall risk    Baseline 73.41 secs    Time 3    Period Weeks    Status New          Updated Long Term Goals:   PT Long Term Goals - 12/09/20 2016      PT LONG TERM GOAL #1   Title Patient will be independent with final HEP with caregiver assistance, and report compliance with daily walking within the home with assistance (ALL LTGs Due: 01/23/21)    Baseline 12/09/20: current program was reviewed and updated this session, will benefit from further program advancement as pt progresses    Time 6     Period Weeks    Status Revised    Target Date 01/23/21      PT LONG TERM GOAL #2   Title Patient will be able to ambulate >/= 200 ft on indoor level surfaces with CGA and LRAD to demonstrate improved household mobility    Baseline 12/07/20: 115' with min guard/ocassional min A  with RW    Time 6    Period Weeks    Status Revised      PT LONG TERM GOAL #3   Title Patient will completed 5x sit <> stand in </= 25 seconds to demonstrate reduced fall risk and improved balance    Baseline 35.97 secs    Time 6    Period Weeks    Status Revised      PT LONG TERM GOAL #4   Title Patient will improve TUG to </= 60 seconds to demonstrate reduced fall risk and improved balance    Baseline T2/4/22: 73.41 sec's with RW/brace on left LE    Time 6    Period Weeks    Status Revised      PT LONG TERM GOAL #5   Title Patient will improve gait speed to >/= 0.80 ft/sec to demonstrate improved tolerance for household mobility    Baseline 0.58 ft/sec    Time 6    Period Weeks    Status New                  12/09/20 0851  Plan  Clinical Impression Statement Today's skilled session focused on progress toward remaining LTGs. Pt's HEP was updated this session. Baseline scores set for: Timed Up and Go with score 73.41 sec's with RW/AFO, 10 Meter Gait speed with score of 0.58 ft/sec with RW/AFO, and 5 time sit to stand with score of 35.97 secs with UE support from   standard height surface. The pt is making steady progress and primary PT plans to recert to continue to address strengthening, balance and gait.  Personal Factors and Comorbidities Comorbidity 3+;Time since onset of injury/illness/exacerbation;Age  Comorbidities R Basal Ganglia CVA, Hyperlipidemia, Hypothyroidism, Asthma, Glaucoma, HTN  Examination-Activity Limitations Bed Mobility;Dressing;Locomotion Level;Reach Overhead;Stairs;Stand;Transfers  Examination-Participation Restrictions Community Activity;Cleaning  Pt will benefit from  skilled therapeutic intervention in order to improve on the following deficits Abnormal gait;Decreased balance;Decreased mobility;Decreased endurance;Difficulty walking;Impaired tone;Impaired sensation;Pain;Decreased strength;Decreased safety awareness;Decreased knowledge of use of DME;Decreased coordination;Decreased activity tolerance;Decreased range of motion  Stability/Clinical Decision Making Evolving/Moderate complexity  Rehab Potential Fair  PT Frequency 2x / week  PT Duration 6 weeks  PT Treatment/Interventions ADLs/Self Care Home Management;Cryotherapy;Electrical Stimulation;Moist Heat;DME Instruction;Gait training;Stair training;Functional mobility training;Therapeutic activities;Therapeutic exercise;Balance training;Neuromuscular re-education;Patient/family education;Orthotic Fit/Training;Manual techniques;Passive range of motion  PT Next Visit Plan continue to work on LE strengthening.  continue with use of anterior Ottobock brace. Continue to work on standing posture and weight shifitng.  PT Home Exercise Plan Access Code: [email or web address]  Consulted and Agree with Plan of Care Patient          Patient will benefit from skilled therapeutic intervention in order to improve the following deficits and impairments:  Abnormal gait,Decreased balance,Decreased mobility,Decreased endurance,Difficulty walking,Impaired tone,Impaired sensation,Pain,Decreased strength,Decreased safety awareness,Decreased knowledge of use of DME,Decreased coordination,Decreased activity tolerance,Decreased range of motion  Visit Diagnosis: Muscle weakness (generalized)  Unsteadiness on feet  Other lack of coordination  Hemiplegia and hemiparesis following cerebral infarction affecting left non-dominant side (HCC)  Other abnormalities of gait and mobility     Problem List Patient Active Problem List   Diagnosis Date Noted  . Spasticity 11/30/2020  . Wheelchair dependence 10/07/2020  . Left knee pain  07/26/2020  . Cerebrovascular accident (CVA) of right basal ganglia (HCC) 07/26/2020  . Left hemiparesis (HCC) 07/25/2020  . Hypokalemia 07/20/2020  . Right basal ganglia embolic stroke (HCC) 07/20/2020  . Essential hypertension 07/20/2020  . Hypothyroidism 07/20/2020    Kathy Bury, PTA, CLT [facility] [address], [address] [place], [place] [address] [phone] 12/11/20, 8:22 PM   Addendum By: Kaitlyn B Martin, PT, DPT [facility] [address] [place], [place]  [address] Phone:  [phone] Fax:  [phone]   Name: Nicole [name] MRN: [id number] Date of Birth: 11/26/1929   

## 2020-12-12 NOTE — Addendum Note (Signed)
Addended by: Jethro Bastos B on: 12/12/2020 09:34 AM   Modules accepted: Orders

## 2020-12-14 ENCOUNTER — Other Ambulatory Visit: Payer: Self-pay

## 2020-12-14 ENCOUNTER — Ambulatory Visit: Payer: Medicare Other | Admitting: Physical Therapy

## 2020-12-14 ENCOUNTER — Ambulatory Visit: Payer: Medicare Other | Admitting: Occupational Therapy

## 2020-12-14 ENCOUNTER — Encounter: Payer: Self-pay | Admitting: Occupational Therapy

## 2020-12-14 ENCOUNTER — Encounter: Payer: Self-pay | Admitting: Physical Therapy

## 2020-12-14 DIAGNOSIS — R2681 Unsteadiness on feet: Secondary | ICD-10-CM

## 2020-12-14 DIAGNOSIS — M25512 Pain in left shoulder: Secondary | ICD-10-CM

## 2020-12-14 DIAGNOSIS — R262 Difficulty in walking, not elsewhere classified: Secondary | ICD-10-CM | POA: Diagnosis not present

## 2020-12-14 DIAGNOSIS — M6281 Muscle weakness (generalized): Secondary | ICD-10-CM

## 2020-12-14 DIAGNOSIS — I69354 Hemiplegia and hemiparesis following cerebral infarction affecting left non-dominant side: Secondary | ICD-10-CM

## 2020-12-14 DIAGNOSIS — R278 Other lack of coordination: Secondary | ICD-10-CM | POA: Diagnosis not present

## 2020-12-14 DIAGNOSIS — M25612 Stiffness of left shoulder, not elsewhere classified: Secondary | ICD-10-CM | POA: Diagnosis not present

## 2020-12-14 DIAGNOSIS — R2689 Other abnormalities of gait and mobility: Secondary | ICD-10-CM

## 2020-12-14 NOTE — Therapy (Signed)
[facility] [address] [place], [place], [address] Phone: [phone]   Fax:  [phone]  Physical Therapy Treatment  Patient Details  Name: Nicole [name] MRN: [id number] Date of Birth: [birth year] Referring Provider (PT): Courtney Heys, MD   Encounter Date: 12/14/2020   PT End of Session - 12/14/20 1156    Visit Number 14    Number of Visits 25    Date for PT Re-Evaluation 02/10/21   POC for 6 weeks, Cert for 60 days   Authorization Type BCBS Medicare (10th Visit PN)    Progress Note Due on Visit 20    PT Start Time 1016    PT Stop Time 1057    PT Time Calculation (min) 41 min    Equipment Utilized During Treatment Gait belt    Activity Tolerance Patient tolerated treatment well    Behavior During Therapy WFL for tasks assessed/performed           Past Medical History:  Diagnosis Date  . Hypertension   . Stroke [facility])    September 2021  . Thyroid disease     History reviewed. No pertinent surgical history.  There were no vitals filed for this visit.   Subjective Assessment - 12/14/20 1024    Subjective Nothing new since she was last here.    Patient is accompained by: Family member    Pertinent History R Basal Ganglia CVA, Hyperlipidemia, Hypothyroidism, Asthma, Glaucoma, HTN    Limitations Standing;Walking    Currently in Pain? No/denies                             [facility] Adult PT Treatment/Exercise - 12/14/20 1040      Transfers   Transfers Sit to Stand;Stand to [employer] Transfers    Sit to Stand 4: Min guard;With upper extremity assist    Sit to Stand Details Verbal cues for sequencing;Verbal cues for technique    Stand to Sit 4: Min guard;With upper extremity assist;To bed;To chair/3-in-1    Stand to Sit Details (indicate cue type and reason) Verbal cues for sequencing;Verbal cues for technique;Verbal cues for precautions/safety    Comments total of approx 5-6 sit <>  stands performed throughout session, cues to press up with R palm from mat table vs. fingertips      Ambulation/Gait   Ambulation/Gait Yes    Ambulation/Gait Assistance 4: Min guard    Ambulation/Gait Assistance Details with use of L anterior ottobock AFO, min guard mostly with one instance of min A. pt begins with step to pattern for first couple of steps and progresses to step through pattern with incr distance. cues for posture    Ambulation Distance (Feet) 40 Feet   x1, 115 x 2   Assistive device Rolling walker;Other (Comment)    Gait Pattern Step-to pattern;Step-through pattern;Decreased step length - right;Decreased step length - left;Decreased stance time - left;Decreased hip/knee flexion - left;Decreased stride length;Decreased weight shift to left;Left flexed knee in stance;Antalgic;Trunk flexed;Narrow base of support    Ambulation Surface Level;Indoor      Exercises   Exercises Other Exercises    Other Exercises  standing at RW: 2 x 10 reps lifting LLE up and tapping 4" step for hip/knee flexion, cues for incr foot clerance and posture, min guard, pt catching L foot on RW a couple times      Knee/Hip Exercises: Aerobic   Other Aerobic Scifit level 1.5 for  6 minutes with BLEs only for strengthening and activity tolerance, cues to go through full range of motion, needing intermittent assist to keep LLE in proper position                    PT Short Term Goals - 12/12/20 0923      PT SHORT TERM GOAL #1   Title Patient/family member will report complteing household ambulation with RW at least 2x/day (All STGs Due: 01/02/21)    Baseline not currently ambulating in the home    Time 3    Period Weeks    Status Partially Met    Target Date 01/02/21      PT SHORT TERM GOAL #2   Title Patient will improve 5x sit <> stand to </= 30 seconds with UE support to demonstrate improved balance    Baseline 35.97 secs    Time 3    Period Weeks    Status New      PT SHORT TERM GOAL  #3   Title Patient will improve TUG to </= to 68 seconds to demonstrate improved balance and reduced fall risk    Baseline 73.41 secs    Time 3    Period Weeks    Status New             PT Long Term Goals - 12/09/20 2016      PT LONG TERM GOAL #1   Title Patient will be independent with final HEP with caregiver assistance, and report compliance with daily walking within the home with assistance (ALL LTGs Due: 01/23/21)    Baseline 12/09/20: current program was reviewed and updated this session, will benefit from further program advancement as pt progresses    Time 6    Period Weeks    Status Revised    Target Date 01/23/21      PT LONG TERM GOAL #2   Title Patient will be able to ambulate >/= 200 ft on indoor level surfaces with CGA and LRAD to demonstrate improved household mobility    Baseline 12/07/20: 115' with min guard/ocassional min A  with RW    Time 6    Period Weeks    Status Revised      PT LONG TERM GOAL #3   Title Patient will completed 5x sit <> stand in </= 25 seconds to demonstrate reduced fall risk and improved balance    Baseline 35.97 secs    Time 6    Period Weeks    Status Revised      PT LONG TERM GOAL #4   Title Patient will improve TUG to </= 60 seconds to demonstrate reduced fall risk and improved balance    Baseline T2/4/22: 73.41 sec's with RW/brace on left LE    Time 6    Period Weeks    Status Revised      PT LONG TERM GOAL #5   Title Patient will improve gait speed to >/= 0.80 ft/sec to demonstrate improved tolerance for household mobility    Baseline 0.58 ft/sec    Time 6    Period Weeks    Status New                 Plan - 12/14/20 1213    Clinical Impression Statement Continued to ambulate with RW and L anterior ottobock with pt able to incr distance this session primarily with min guard with one instance of min A for balance. Less rest breaks needed.  Will continue to progress towards LTGs.    Personal Factors and Comorbidities  Comorbidity 3+;Time since onset of injury/illness/exacerbation;Age    Comorbidities R Basal Ganglia CVA, Hyperlipidemia, Hypothyroidism, Asthma, Glaucoma, HTN    Examination-Activity Limitations Bed Mobility;Dressing;Locomotion Level;Reach Overhead;Stairs;Stand;Transfers    Examination-Participation Restrictions Armed forces logistics/support/administrative officer Evolving/Moderate complexity    Rehab Potential Fair    PT Frequency 2x / week    PT Duration 6 weeks    PT Treatment/Interventions ADLs/Self Care Home Management;Cryotherapy;Electrical Stimulation;Moist Heat;DME Instruction;Gait training;Stair training;Functional mobility training;Therapeutic activities;Therapeutic exercise;Balance training;Neuromuscular re-education;Patient/family education;Orthotic Fit/Training;Manual techniques;Passive range of motion    PT Next Visit Plan continue to work on LE strengthening.  continue with use of anterior Ottobock brace [name] should be delivering it in a week or 2) Continue to work on standing posture and weight shifitng.    PT Home Exercise Plan Access Code: [email or web address]    Consulted and Agree with Plan of Care Patient           Patient will benefit from skilled therapeutic intervention in order to improve the following deficits and impairments:  Abnormal gait,Decreased balance,Decreased mobility,Decreased endurance,Difficulty walking,Impaired tone,Impaired sensation,Pain,Decreased strength,Decreased safety awareness,Decreased knowledge of use of DME,Decreased coordination,Decreased activity tolerance,Decreased range of motion  Visit Diagnosis: Muscle weakness (generalized)  Hemiplegia and hemiparesis following cerebral infarction affecting left non-dominant side (HCC)  Other abnormalities of gait and mobility  Unsteadiness on feet     Problem List Patient Active Problem List   Diagnosis Date Noted  . Spasticity 11/30/2020  . Wheelchair dependence 10/07/2020  . Left knee  pain 07/26/2020  . Cerebrovascular accident (CVA) of right basal ganglia ([place]) 07/26/2020  . Left hemiparesis ([place]) 07/25/2020  . Hypokalemia 07/20/2020  . Right basal ganglia embolic stroke ([place]) 25/36/6440  . Essential hypertension 07/20/2020  . Hypothyroidism 07/20/2020    Arliss Journey, PT, DPT  12/14/2020, 12:14 PM  [facility] [address] [place], [place], [address] Phone: [phone]   Fax:  [phone]  Name: Nicole [name] MRN: [id number] Date of Birth: 1930-04-16

## 2020-12-14 NOTE — Therapy (Signed)
[facility] [address] [place], [place], [address] Phone: [phone]   Fax:  [phone]  Occupational Therapy Treatment  Patient Details  Name: Nicole [name] MRN: [id number] Date of Birth: Mar 28, 1930 Referring Provider (OT): Genice Rouge, MD   Encounter Date: 12/14/2020   OT End of Session - 12/14/20 0947    Visit Number 10    Number of Visits 17    Date for OT Re-Evaluation 01/04/21    Authorization Time Period $40 Copay VL:MN No Auth required  10th visit PN  Week 5/8 (12/09/20)    Authorization - Visit Number 10    Authorization - Number of Visits 17    Progress Note Due on Visit 19    OT Start Time [phone]    OT Stop Time 1015    OT Time Calculation (min) 38 min    Activity Tolerance Patient tolerated treatment well    Behavior During Therapy [facility] for tasks assessed/performed           Past Medical History:  Diagnosis Date  . Hypertension   . Stroke [facility]    September 2021  . Thyroid disease     History reviewed. No pertinent surgical history.  There were no vitals filed for this visit.   Subjective Assessment - 12/14/20 0946    Subjective  Denies pain    Pertinent History CVA R basal ganglia, HTN, HLD, hypothyroidism, asthma, glaucoma    Currently in Pain? No/denies                  Treatment: Seated low range functional reaching to place yellow-green clothespins on rack, min v.c to avoid compensation. Functional reaching to place large to small pegs in semi circle pegboard for increased fine motor coordination, min v.c Placing pennies in bank with LUE for increased fine motor coordination, min difficulty/ v.c                OT Education - 12/14/20 0946    Education Details reviewed supine HEP 10-20 reps each( chest press and shoulder flexion with PVC pipe frame min facilitation for shoulder and scapular positioning)    Person(s) Educated Patient;Child(ren)    Methods  Explanation;Demonstration    Comprehension Verbalized understanding;Returned demonstration;Verbal cues required;Tactile cues required            OT Short Term Goals - 12/09/20 1032      OT SHORT TERM GOAL #1   Title Pt will be independent with HEP (12/07/20)    Time 4    Period Weeks    Status Achieved    Target Date 12/07/20      OT SHORT TERM GOAL #2   Title Pt will improve grip strength in LUE by 3 lbs or greater for increase in functional use of LUE and increase independence with clothing management.    Baseline RUE 42.1 LUE 26.8    Time 4    Period Weeks    Status Achieved   37.9 LUE     OT SHORT TERM GOAL #3   Title Pt will obtain item from mid level shelf with increase in shoulder flexion to 70 degrees with good positioning and reduction in shoulder hiking and compensatory movements.    Baseline LUE 66 degrees sh flexion    Time 4    Period Weeks    Status On-going      OT SHORT TERM GOAL #4   Title Pt will demonstrate improved box and blocks score  to 35 or greater with LUE for increase in functional use of LUE.    Baseline 29 LUE    Time 4    Period Weeks    Status On-going   12/07/20 - 28 blocks     OT SHORT TERM GOAL #5   Title Pt will verbalize understanding of sleep positions for LUE to decrease pain.    Baseline 6/10 LUE shoulder pain at night    Time 4    Period Weeks    Status Achieved      OT SHORT TERM GOAL #6   Title Pt will don bra with mod I    Baseline daughter currently assisting    Time 4    Period Weeks    Status Achieved   12/07/20 - pt reports donning bra all clothing            OT Long Term Goals - 12/07/20 1032      OT LONG TERM GOAL #1   Title Pt will be independent with updated HEP 01/04/2021    Time 8    Period Weeks    Status New      OT LONG TERM GOAL #2   Title Pt will demonstrate increased grip strength in LUE to 33 lbs or greater for improved functional use.    Baseline RUE 42.1 LUE 26.8    Time 8    Period Weeks     Status Achieved   37.9 lbs LUE 12/07/20     OT LONG TERM GOAL #3   Title Pt will perform UB and LB dressing with mod I.    Time 8    Period Weeks    Status On-going      OT LONG TERM GOAL #4   Title Pt will increase fine motor coordination by completing 9 hole peg test in 50 seconds or less with LUE.    Baseline RUE 27.47 LUE 61.87    Time 8    Period Weeks    Status New      OT LONG TERM GOAL #5   Title Pt will improve shoulder flexion to at least 100 degrees in order to obtain object and increase functional reach.    Baseline LUE 66 degrees sh flexion    Time 8    Period Weeks    Status New      OT LONG TERM GOAL #6   Title Pt will report decrease in pain in LUE shoulder at night to less than or equal to 4/10 with improved sleep positioning and range of motion    Time 8    Period Weeks    Status New                 Plan - 12/14/20 1010    Clinical Impression Statement Pt is progressing towards goals with improving functional use .    OT Occupational Profile and History Problem Focused Assessment - Including review of records relating to presenting problem    Occupational performance deficits (Please refer to evaluation for details): ADL's;IADL's    Body Structure / Function / Physical Skills ADL;Balance;Coordination;IADL;Pain;GMC;Hearing;Strength;Tone;Sensation;Dexterity;Decreased knowledge of use of DME;FMC;UE functional use;ROM;Flexibility    Cognitive Skills Attention;Sequencing;Problem Solve    OT Frequency 2x / week    OT Duration 8 weeks    OT Treatment/Interventions Self-care/ADL training;Cryotherapy;Ultrasound;Moist Heat;Electrical Stimulation;Paraffin;Energy conservation;DME and/or AE instruction;Manual Therapy;Functional Mobility Training;Neuromuscular education;Therapeutic exercise;Visual/perceptual remediation/compensation;Patient/family education;Balance training;Therapeutic activities;Passive range of motion;Cognitive remediation/compensation    Plan  scapula mobility, low  level reaching    Consulted and Agree with Plan of Care Family member/caregiver;Patient    Family Member Consulted caregiever Pam           Patient will benefit from skilled therapeutic intervention in order to improve the following deficits and impairments:   Body Structure / Function / Physical Skills: ADL,Balance,Coordination,IADL,Pain,GMC,Hearing,Strength,Tone,Sensation,Dexterity,Decreased knowledge of use of DME,FMC,UE functional use,ROM,Flexibility Cognitive Skills: Attention,Sequencing,Problem Solve     Visit Diagnosis: Muscle weakness (generalized)  Stiffness of left shoulder, not elsewhere classified  Other lack of coordination  Hemiplegia and hemiparesis following cerebral infarction affecting left non-dominant side (HCC)  Other abnormalities of gait and mobility  Acute pain of left shoulder    Problem List Patient Active Problem List   Diagnosis Date Noted  . Spasticity 11/30/2020  . Wheelchair dependence 10/07/2020  . Left knee pain 07/26/2020  . Cerebrovascular accident (CVA) of right basal ganglia (HCC) 07/26/2020  . Left hemiparesis (HCC) 07/25/2020  . Hypokalemia 07/20/2020  . Right basal ganglia embolic stroke (HCC) 07/20/2020  . Essential hypertension 07/20/2020  . Hypothyroidism 07/20/2020    RINE,KATHRYN 12/14/2020, 11:38 AM Keene Breath, OTR/L [phone] Phone: [phone] 11:59 AM 12/14/20 [facility] [address] [place], [place], [address] Phone: [phone]   Fax:  [phone]  Name: Nicole [name] MRN: [id number] Date of Birth: 1930/04/24

## 2020-12-16 ENCOUNTER — Ambulatory Visit: Payer: Medicare Other | Admitting: Occupational Therapy

## 2020-12-16 ENCOUNTER — Encounter: Payer: Self-pay | Admitting: Occupational Therapy

## 2020-12-16 ENCOUNTER — Ambulatory Visit: Payer: Medicare Other | Admitting: Physical Therapy

## 2020-12-16 ENCOUNTER — Encounter: Payer: Self-pay | Admitting: Physical Therapy

## 2020-12-16 ENCOUNTER — Other Ambulatory Visit: Payer: Self-pay

## 2020-12-16 DIAGNOSIS — I69354 Hemiplegia and hemiparesis following cerebral infarction affecting left non-dominant side: Secondary | ICD-10-CM | POA: Diagnosis not present

## 2020-12-16 DIAGNOSIS — M6281 Muscle weakness (generalized): Secondary | ICD-10-CM

## 2020-12-16 DIAGNOSIS — M25512 Pain in left shoulder: Secondary | ICD-10-CM | POA: Diagnosis not present

## 2020-12-16 DIAGNOSIS — R278 Other lack of coordination: Secondary | ICD-10-CM | POA: Diagnosis not present

## 2020-12-16 DIAGNOSIS — M25612 Stiffness of left shoulder, not elsewhere classified: Secondary | ICD-10-CM

## 2020-12-16 DIAGNOSIS — R2681 Unsteadiness on feet: Secondary | ICD-10-CM | POA: Diagnosis not present

## 2020-12-16 DIAGNOSIS — R2689 Other abnormalities of gait and mobility: Secondary | ICD-10-CM

## 2020-12-16 DIAGNOSIS — R262 Difficulty in walking, not elsewhere classified: Secondary | ICD-10-CM | POA: Diagnosis not present

## 2020-12-16 NOTE — Therapy (Signed)
[name] [address] [place], [place], [address] Phone: [phone]   Fax:  [phone]  Occupational Therapy Treatment  Patient Details  Name: Nicole [name] MRN: [id number] Date of Birth: [birth year] Referring Provider (OT): Genice Rouge, MD   Encounter Date: 12/16/2020   OT End of Session - 12/16/20 0932    Visit Number 11    Number of Visits 17    Date for OT Re-Evaluation 01/04/21    Authorization Time Period $40 Copay VL:MN No Auth required  10th visit PN  Week 6/8 (12/16/20)    Authorization - Visit Number 11    Authorization - Number of Visits 17    Progress Note Due on Visit 19    OT Start Time 0931    OT Stop Time 1010    OT Time Calculation (min) 39 min    Activity Tolerance Patient tolerated treatment well    Behavior During Therapy [facility] for tasks assessed/performed           Past Medical History:  Diagnosis Date  . Hypertension   . Stroke [facility])    September 2021  . Thyroid disease     History reviewed. No pertinent surgical history.  There were no vitals filed for this visit.   Subjective Assessment - 12/16/20 0933    Subjective  Pt denies any pain. Pt reports pain is much beter at night and she doesn't have much pain at night now.    Patient is accompanied by: --   caregiver   Pertinent History CVA R basal ganglia, HTN, HLD, hypothyroidism, asthma, glaucoma    Currently in Pain? No/denies                        OT Treatments/Exercises (OP) - 12/16/20 3559      Functional Reaching Activities   Low Level reaching for cylindrical pegs x 26 x2 with LUE - pt demonstrate improved reaching and with less shoulder hiking and lateral trunk flexion than previous sessions. Pt continued with forward reaching at low level for medium cones and stacking with LUE.      Fine Motor Coordination (Hand/Wrist)   Fine Motor Coordination Large Pegboard    Large Pegboard medium pegs with LUE  with min drops and min difficulty with coordination. Pt with moderate lateral flexio nand shoulder hiking for compensation of LUE shoulder strength.                    OT Short Term Goals - 12/09/20 1032      OT SHORT TERM GOAL #1   Title Pt will be independent with HEP (12/07/20)    Time 4    Period Weeks    Status Achieved    Target Date 12/07/20      OT SHORT TERM GOAL #2   Title Pt will improve grip strength in LUE by 3 lbs or greater for increase in functional use of LUE and increase independence with clothing management.    Baseline RUE 42.1 LUE 26.8    Time 4    Period Weeks    Status Achieved   37.9 LUE     OT SHORT TERM GOAL #3   Title Pt will obtain item from mid level shelf with increase in shoulder flexion to 70 degrees with good positioning and reduction in shoulder hiking and compensatory movements.    Baseline LUE 66 degrees sh flexion    Time 4  Period Weeks    Status On-going      OT SHORT TERM GOAL #4   Title Pt will demonstrate improved box and blocks score to 35 or greater with LUE for increase in functional use of LUE.    Baseline 29 LUE    Time 4    Period Weeks    Status On-going   12/07/20 - 28 blocks     OT SHORT TERM GOAL #5   Title Pt will verbalize understanding of sleep positions for LUE to decrease pain.    Baseline 6/10 LUE shoulder pain at night    Time 4    Period Weeks    Status Achieved      OT SHORT TERM GOAL #6   Title Pt will don bra with mod I    Baseline daughter currently assisting    Time 4    Period Weeks    Status Achieved   12/07/20 - pt reports donning bra all clothing            OT Long Term Goals - 12/16/20 0935      OT LONG TERM GOAL #1   Title Pt will be independent with updated HEP 01/04/2021    Time 8    Period Weeks    Status On-going      OT LONG TERM GOAL #2   Title Pt will demonstrate increased grip strength in LUE to 33 lbs or greater for improved functional use.    Baseline RUE 42.1 LUE 26.8     Time 8    Period Weeks    Status Achieved   37.9 lbs LUE 12/07/20     OT LONG TERM GOAL #3   Title Pt will perform UB and LB dressing with mod I.    Time 8    Period Weeks    Status Achieved   pt and caregiver report pt is doing all dressing except shower days     OT LONG TERM GOAL #4   Title Pt will increase fine motor coordination by completing 9 hole peg test in 50 seconds or less with LUE.    Baseline RUE 27.47 LUE 61.87    Time 8    Period Weeks    Status On-going      OT LONG TERM GOAL #5   Title Pt will improve shoulder flexion to at least 100 degrees in order to obtain object and increase functional reach.    Baseline LUE 66 degrees sh flexion    Time 8    Period Weeks    Status New      OT LONG TERM GOAL #6   Title Pt will report decrease in pain in LUE shoulder at night to less than or equal to 4/10 with improved sleep positioning and range of motion    Time 8    Period Weeks    Status Achieved                 Plan - 12/16/20 1218    Clinical Impression Statement Pt with improved movement in LUE shoulder this day with low level reaching.    OT Occupational Profile and History Problem Focused Assessment - Including review of records relating to presenting problem    Occupational performance deficits (Please refer to evaluation for details): ADL's;IADL's    Body Structure / Function / Physical Skills ADL;Balance;Coordination;IADL;Pain;GMC;Hearing;Strength;Tone;Sensation;Dexterity;Decreased knowledge of use of DME;FMC;UE functional use;ROM;Flexibility    Cognitive Skills Attention;Sequencing;Problem Solve    OT Frequency  2x / week    OT Duration 8 weeks    OT Treatment/Interventions Self-care/ADL training;Cryotherapy;Ultrasound;Moist Heat;Electrical Stimulation;Paraffin;Energy conservation;DME and/or AE instruction;Manual Therapy;Functional Mobility Training;Neuromuscular education;Therapeutic exercise;Visual/perceptual remediation/compensation;Patient/family  education;Balance training;Therapeutic activities;Passive range of motion;Cognitive remediation/compensation    Plan scapula mobility, low level reaching    Consulted and Agree with Plan of Care Family member/caregiver;Patient    Family Member Consulted caregiever Pam           Patient will benefit from skilled therapeutic intervention in order to improve the following deficits and impairments:   Body Structure / Function / Physical Skills: ADL,Balance,Coordination,IADL,Pain,GMC,Hearing,Strength,Tone,Sensation,Dexterity,Decreased knowledge of use of DME,FMC,UE functional use,ROM,Flexibility Cognitive Skills: Attention,Sequencing,Problem Solve     Visit Diagnosis: Muscle weakness (generalized)  Hemiplegia and hemiparesis following cerebral infarction affecting left non-dominant side (HCC)  Stiffness of left shoulder, not elsewhere classified  Other lack of coordination  Acute pain of left shoulder    Problem List Patient Active Problem List   Diagnosis Date Noted  . Spasticity 11/30/2020  . Wheelchair dependence 10/07/2020  . Left knee pain 07/26/2020  . Cerebrovascular accident (CVA) of right basal ganglia (HCC) 07/26/2020  . Left hemiparesis (HCC) 07/25/2020  . Hypokalemia 07/20/2020  . Right basal ganglia embolic stroke (HCC) 07/20/2020  . Essential hypertension 07/20/2020  . Hypothyroidism 07/20/2020    Junious Dresser MOT, OTR/L  12/16/2020, 12:18 PM  [facility] [address] [place], [place], [address] Phone: [phone]   Fax:  [phone]  Name: Nicole [name] MRN: [id number] Date of Birth: 16-Sep-1930

## 2020-12-18 NOTE — Therapy (Signed)
[facility] [address] [place], [place], [address] Phone: [phone]   Fax:  [phone]  Physical Therapy Treatment  Patient Details  Name: Nicole [name] MRN: [id number] Date of Birth: [birth year] Referring Provider (PT): Courtney Heys, MD   Encounter Date: 12/16/2020   12/16/20 1017  PT Visits / Re-Eval  Visit Number 15  Number of Visits 25  Date for PT Re-Evaluation 02/10/21 (POC for 6 weeks, Cert for 60 days)  Authorization  Authorization Type [employer] Medicare (10th Visit PN)  Progress Note Due on Visit 20  PT Time Calculation  PT Start Time 1015  PT Stop Time 1100  PT Time Calculation (min) 45 min  PT - End of Session  Equipment Utilized During Treatment Gait belt  Activity Tolerance Patient tolerated treatment well  Behavior During Therapy [facility] for tasks assessed/performed     Past Medical History:  Diagnosis Date  . Hypertension   . Stroke [facility])    September 2021  . Thyroid disease     History reviewed. No pertinent surgical history.  There were no vitals filed for this visit.     12/16/20 1015  Symptoms/Limitations  Subjective No new complaints. No falls or pain to report. HEP is going well.  Patient is accompained by: Family member (Pam, CNA)  Pertinent History R Basal Ganglia CVA, Hyperlipidemia, Hypothyroidism, Asthma, Glaucoma, HTN  Patient Stated Goals Be able to walk;  Pain Assessment  Currently in Pain? No/denies  Pain Score 0      12/16/20 1018  Transfers  Transfers Sit to Stand;Stand to [employer] Transfers  Sit to Stand 4: Min guard;With upper extremity assist  Stand to Sit 4: Min guard;With upper extremity assist;To bed;To chair/3-in-1  Comments no assist needed today, increased time for standing/processing to initiate mobility  Ambulation/Gait  Ambulation/Gait Yes  Ambulation/Gait Assistance 4: Min guard  Ambulation/Gait Assistance Details continued with use of left  anterior ottobock brace with gait.  Ambulation Distance (Feet) 130 Feet (x1, plus around gym with session)  Assistive device Rolling walker;Other (Comment)  Gait Pattern Step-to pattern;Step-through pattern;Decreased step length - right;Decreased step length - left;Decreased stance time - left;Decreased hip/knee flexion - left;Decreased stride length;Decreased weight shift to left;Left flexed knee in stance;Antalgic;Trunk flexed;Narrow base of support  Stairs Yes  Stairs Assistance 4: Min guard;4: Min assist  Stairs Assistance Details (indicate cue type and reason) in parallel bars: 4 inch box x 6 reps, then 6 inch box x4 reps working on sequencing with single step negotiation with bil UE support. then with stairs- bil rails for 4 steps with min guard to min assist with second person standby for safety. step to pattern with cues needed to advance hands along rails and for weight shifting.  Stair Management Technique Two rails;Step to pattern;Forwards  Number of Stairs  (see comments above)  Height of Stairs  (4-6)         PT Short Term Goals - 12/12/20 4967      PT SHORT TERM GOAL #1   Title Patient/family member will report complteing household ambulation with RW at least 2x/day (All STGs Due: 01/02/21)    Baseline not currently ambulating in the home    Time 3    Period Weeks    Status Partially Met    Target Date 01/02/21      PT SHORT TERM GOAL #2   Title Patient will improve 5x sit <> stand to </= 30 seconds with UE support to demonstrate improved  balance    Baseline 35.97 secs    Time 3    Period Weeks    Status New      PT SHORT TERM GOAL #3   Title Patient will improve TUG to </= to 68 seconds to demonstrate improved balance and reduced fall risk    Baseline 73.41 secs    Time 3    Period Weeks    Status New             PT Long Term Goals - 12/09/20 2016      PT LONG TERM GOAL #1   Title Patient will be independent with final HEP with caregiver assistance,  and report compliance with daily walking within the home with assistance (ALL LTGs Due: 01/23/21)    Baseline 12/09/20: current program was reviewed and updated this session, will benefit from further program advancement as pt progresses    Time 6    Period Weeks    Status Revised    Target Date 01/23/21      PT LONG TERM GOAL #2   Title Patient will be able to ambulate >/= 200 ft on indoor level surfaces with CGA and LRAD to demonstrate improved household mobility    Baseline 12/07/20: 115' with min guard/ocassional min A  with RW    Time 6    Period Weeks    Status Revised      PT LONG TERM GOAL #3   Title Patient will completed 5x sit <> stand in </= 25 seconds to demonstrate reduced fall risk and improved balance    Baseline 35.97 secs    Time 6    Period Weeks    Status Revised      PT LONG TERM GOAL #4   Title Patient will improve TUG to </= 60 seconds to demonstrate reduced fall risk and improved balance    Baseline T2/4/22: 73.41 sec's with RW/brace on left LE    Time 6    Period Weeks    Status Revised      PT LONG TERM GOAL #5   Title Patient will improve gait speed to >/= 0.80 ft/sec to demonstrate improved tolerance for household mobility    Baseline 0.58 ft/sec    Time 6    Period Weeks    Status New             12/16/20 1017  Plan  Clinical Impression Statement Today's skilled session continued with use of brace with gait and continued to work on pre-stair negotaiton progressing to pt completing set of 4 steps this session. The pt is making great progress toward goals and should benefit from continued PT to progress toward unmet goals.  Personal Factors and Comorbidities Comorbidity 3+;Time since onset of injury/illness/exacerbation;Age  Comorbidities R Basal Ganglia CVA, Hyperlipidemia, Hypothyroidism, Asthma, Glaucoma, HTN  Examination-Activity Limitations Bed Mobility;Dressing;Locomotion Level;Reach Overhead;Stairs;Stand;Transfers  Examination-Participation  Restrictions Community Activity;Cleaning  Pt will benefit from skilled therapeutic intervention in order to improve on the following deficits Abnormal gait;Decreased balance;Decreased mobility;Decreased endurance;Difficulty walking;Impaired tone;Impaired sensation;Pain;Decreased strength;Decreased safety awareness;Decreased knowledge of use of DME;Decreased coordination;Decreased activity tolerance;Decreased range of motion  Stability/Clinical Decision Making Evolving/Moderate complexity  Rehab Potential Fair  PT Frequency 2x / week  PT Duration 6 weeks  PT Treatment/Interventions ADLs/Self Care Home Management;Cryotherapy;Electrical Stimulation;Moist Heat;DME Instruction;Gait training;Stair training;Functional mobility training;Therapeutic activities;Therapeutic exercise;Balance training;Neuromuscular re-education;Patient/family education;Orthotic Fit/Training;Manual techniques;Passive range of motion  PT Next Visit Plan continue to work on LE strengthening.  continue with use of anterior Ottobock brace (  [name] should be delivering it in a week or 2) Continue to work on standing posture and weight shifitng. continue to work on stair negotiation as pt currently bumps up/down steps on her bottom.  PT Home Exercise Plan Access Code: [email or web address]  Consulted and Agree with Plan of Care Patient          Patient will benefit from skilled therapeutic intervention in order to improve the following deficits and impairments:  Abnormal gait,Decreased balance,Decreased mobility,Decreased endurance,Difficulty walking,Impaired tone,Impaired sensation,Pain,Decreased strength,Decreased safety awareness,Decreased knowledge of use of DME,Decreased coordination,Decreased activity tolerance,Decreased range of motion  Visit Diagnosis: Muscle weakness (generalized)  Hemiplegia and hemiparesis following cerebral infarction affecting left non-dominant side (HCC)  Other abnormalities of gait and mobility  Unsteadiness  on feet     Problem List Patient Active Problem List   Diagnosis Date Noted  . Spasticity 11/30/2020  . Wheelchair dependence 10/07/2020  . Left knee pain 07/26/2020  . Cerebrovascular accident (CVA) of right basal ganglia ([place]) 07/26/2020  . Left hemiparesis ([place]) 07/25/2020  . Hypokalemia 07/20/2020  . Right basal ganglia embolic stroke ([place]) 46/50/3546  . Essential hypertension 07/20/2020  . Hypothyroidism 07/20/2020   Willow Ora, PTA, [place] [address], [place], [place] [address] [phone] 12/18/20, 7:39 PM   Name: Nicole [name] MRN: [id number] Date of Birth: 1929/11/13

## 2020-12-20 DIAGNOSIS — M25562 Pain in left knee: Secondary | ICD-10-CM | POA: Diagnosis not present

## 2020-12-20 DIAGNOSIS — G8194 Hemiplegia, unspecified affecting left nondominant side: Secondary | ICD-10-CM | POA: Diagnosis not present

## 2020-12-20 DIAGNOSIS — I6381 Other cerebral infarction due to occlusion or stenosis of small artery: Secondary | ICD-10-CM | POA: Diagnosis not present

## 2020-12-21 ENCOUNTER — Other Ambulatory Visit: Payer: Self-pay

## 2020-12-21 ENCOUNTER — Ambulatory Visit: Payer: Medicare Other | Admitting: Occupational Therapy

## 2020-12-21 ENCOUNTER — Ambulatory Visit: Payer: Medicare Other | Admitting: Physical Therapy

## 2020-12-21 ENCOUNTER — Encounter: Payer: Self-pay | Admitting: Physical Therapy

## 2020-12-21 DIAGNOSIS — R2681 Unsteadiness on feet: Secondary | ICD-10-CM | POA: Diagnosis not present

## 2020-12-21 DIAGNOSIS — R2689 Other abnormalities of gait and mobility: Secondary | ICD-10-CM | POA: Diagnosis not present

## 2020-12-21 DIAGNOSIS — I69354 Hemiplegia and hemiparesis following cerebral infarction affecting left non-dominant side: Secondary | ICD-10-CM

## 2020-12-21 DIAGNOSIS — M25612 Stiffness of left shoulder, not elsewhere classified: Secondary | ICD-10-CM

## 2020-12-21 DIAGNOSIS — R262 Difficulty in walking, not elsewhere classified: Secondary | ICD-10-CM | POA: Diagnosis not present

## 2020-12-21 DIAGNOSIS — R278 Other lack of coordination: Secondary | ICD-10-CM | POA: Diagnosis not present

## 2020-12-21 DIAGNOSIS — M6281 Muscle weakness (generalized): Secondary | ICD-10-CM | POA: Diagnosis not present

## 2020-12-21 DIAGNOSIS — M25512 Pain in left shoulder: Secondary | ICD-10-CM | POA: Diagnosis not present

## 2020-12-21 NOTE — Therapy (Signed)
[facility] [address] [place], [place], [address] Phone: [phone]   Fax:  [phone]  Occupational Therapy Treatment  Patient Details  Name: Nicole [name] MRN: [id number] Date of Birth: [birth year] Referring Provider (OT): Genice Rouge, MD   Encounter Date: 12/21/2020   OT End of Session - 12/21/20 1018    Visit Number 12    Number of Visits 17    Date for OT Re-Evaluation 01/04/21    Authorization Time Period $40 Copay VL:MN No Auth required  10th visit PN  Week 6/8 (12/16/20)    Authorization - Visit Number 12    Authorization - Number of Visits 17    Progress Note Due on Visit 19    OT Start Time [phone]    OT Stop Time 1015    OT Time Calculation (min) 38 min    Activity Tolerance Patient tolerated treatment well    Behavior During Therapy [facility] for tasks assessed/performed           Past Medical History:  Diagnosis Date  . Hypertension   . Stroke [facility])    September 2021  . Thyroid disease     No past surgical history on file.  There were no vitals filed for this visit.   Subjective Assessment - 12/21/20 0943    Patient is accompanied by: Family member   caregiver   Pertinent History CVA R basal ganglia, HTN, HLD, hypothyroidism, asthma, glaucoma    Currently in Pain? No/denies           Began with AA/ROM BUE's w/ table slides in sh flex/ext and circuduction ex's.  Low to mid level reaching to reach for and pick up cup and bring to mouth then replace on table LUE.  Functional low level reaching placing checkers into cup, into Connect 4 slots for slightly higher reaching. Placing large pegs in pegboard 2 rows, then removing for pinch strength. Placing medium and smaller pegs in semicircular pegboard.  Encouraged pt to use LUE for safe tasks at home as much as possible (helping bathe, wiping down table, pulling up pants, etc) as well as coordination and low level reaching tasks.                         OT Short Term Goals - 12/09/20 1032      OT SHORT TERM GOAL #1   Title Pt will be independent with HEP (12/07/20)    Time 4    Period Weeks    Status Achieved    Target Date 12/07/20      OT SHORT TERM GOAL #2   Title Pt will improve grip strength in LUE by 3 lbs or greater for increase in functional use of LUE and increase independence with clothing management.    Baseline RUE 42.1 LUE 26.8    Time 4    Period Weeks    Status Achieved   37.9 LUE     OT SHORT TERM GOAL #3   Title Pt will obtain item from mid level shelf with increase in shoulder flexion to 70 degrees with good positioning and reduction in shoulder hiking and compensatory movements.    Baseline LUE 66 degrees sh flexion    Time 4    Period Weeks    Status On-going      OT SHORT TERM GOAL #4   Title Pt will demonstrate improved box and blocks score to 35 or greater with  LUE for increase in functional use of LUE.    Baseline 29 LUE    Time 4    Period Weeks    Status On-going   12/07/20 - 28 blocks     OT SHORT TERM GOAL #5   Title Pt will verbalize understanding of sleep positions for LUE to decrease pain.    Baseline 6/10 LUE shoulder pain at night    Time 4    Period Weeks    Status Achieved      OT SHORT TERM GOAL #6   Title Pt will don bra with mod I    Baseline daughter currently assisting    Time 4    Period Weeks    Status Achieved   12/07/20 - pt reports donning bra all clothing            OT Long Term Goals - 12/16/20 0935      OT LONG TERM GOAL #1   Title Pt will be independent with updated HEP 01/04/2021    Time 8    Period Weeks    Status On-going      OT LONG TERM GOAL #2   Title Pt will demonstrate increased grip strength in LUE to 33 lbs or greater for improved functional use.    Baseline RUE 42.1 LUE 26.8    Time 8    Period Weeks    Status Achieved   37.9 lbs LUE 12/07/20     OT LONG TERM GOAL #3   Title Pt will perform UB and LB  dressing with mod I.    Time 8    Period Weeks    Status Achieved   pt and caregiver report pt is doing all dressing except shower days     OT LONG TERM GOAL #4   Title Pt will increase fine motor coordination by completing 9 hole peg test in 50 seconds or less with LUE.    Baseline RUE 27.47 LUE 61.87    Time 8    Period Weeks    Status On-going      OT LONG TERM GOAL #5   Title Pt will improve shoulder flexion to at least 100 degrees in order to obtain object and increase functional reach.    Baseline LUE 66 degrees sh flexion    Time 8    Period Weeks    Status New      OT LONG TERM GOAL #6   Title Pt will report decrease in pain in LUE shoulder at night to less than or equal to 4/10 with improved sleep positioning and range of motion    Time 8    Period Weeks    Status Achieved                 Plan - 12/21/20 1019    Clinical Impression Statement Pt with improved movement in LUE shoulder this day with low level reaching.    OT Occupational Profile and History Problem Focused Assessment - Including review of records relating to presenting problem    Occupational performance deficits (Please refer to evaluation for details): ADL's;IADL's    Body Structure / Function / Physical Skills ADL;Balance;Coordination;IADL;Pain;GMC;Hearing;Strength;Tone;Sensation;Dexterity;Decreased knowledge of use of DME;FMC;UE functional use;ROM;Flexibility    Cognitive Skills Attention;Sequencing;Problem Solve    OT Frequency 2x / week    OT Duration 8 weeks    OT Treatment/Interventions Self-care/ADL training;Cryotherapy;Ultrasound;Moist Heat;Electrical Stimulation;Paraffin;Energy conservation;DME and/or AE instruction;Manual Therapy;Functional Mobility Training;Neuromuscular education;Therapeutic exercise;Visual/perceptual remediation/compensation;Patient/family education;Balance training;Therapeutic activities;Passive range  of motion;Cognitive remediation/compensation    Plan sidelying:  scapula mobility, neuro re-educ (closed chain high level)    Consulted and Agree with Plan of Care Family member/caregiver;Patient    Family Member Consulted caregiever Pam           Patient will benefit from skilled therapeutic intervention in order to improve the following deficits and impairments:   Body Structure / Function / Physical Skills: ADL,Balance,Coordination,IADL,Pain,GMC,Hearing,Strength,Tone,Sensation,Dexterity,Decreased knowledge of use of DME,FMC,UE functional use,ROM,Flexibility Cognitive Skills: Attention,Sequencing,Problem Solve     Visit Diagnosis: Hemiplegia and hemiparesis following cerebral infarction affecting left non-dominant side (HCC)  Stiffness of left shoulder, not elsewhere classified    Problem List Patient Active Problem List   Diagnosis Date Noted  . Spasticity 11/30/2020  . Wheelchair dependence 10/07/2020  . Left knee pain 07/26/2020  . Cerebrovascular accident (CVA) of right basal ganglia (HCC) 07/26/2020  . Left hemiparesis (HCC) 07/25/2020  . Hypokalemia 07/20/2020  . Right basal ganglia embolic stroke (HCC) 07/20/2020  . Essential hypertension 07/20/2020  . Hypothyroidism 07/20/2020    Kelli Churn, OTR/L 12/21/2020, 10:21 AM  [facility] [address] [place], [place], [address] Phone: [phone]   Fax:  [phone]  Name: Nicole [name] MRN: [id number] Date of Birth: 1930/08/09

## 2020-12-21 NOTE — Therapy (Signed)
[facility] [address] [place], [place], [address] Phone: [phone]   Fax:  [phone]  Physical Therapy Treatment  Patient Details  Name: Nicole [name] MRN: [id number] Date of Birth: [birth year] Referring Provider (PT): Courtney Heys, MD   Encounter Date: 12/21/2020   PT End of Session - 12/21/20 1059    Visit Number 16    Number of Visits 25    Date for PT Re-Evaluation 02/10/21   POC for 6 weeks, Cert for 60 days   Authorization Type BC[employer] Medicare (10th Visit PN)    Progress Note Due on Visit 20    PT Start Time 1016    PT Stop Time 1057    PT Time Calculation (min) 41 min    Equipment Utilized During Treatment Gait belt    Activity Tolerance Patient tolerated treatment well    Behavior During Therapy WFL for tasks assessed/performed           Past Medical History:  Diagnosis Date  . Hypertension   . Stroke [facility])    September 2021  . Thyroid disease     History reviewed. No pertinent surgical history.  There were no vitals filed for this visit.   Subjective Assessment - 12/21/20 1020    Subjective No changes. Things are going well at home.    Patient is accompained by: Family member   Pam, CNA   Pertinent History R Basal Ganglia CVA, Hyperlipidemia, Hypothyroidism, Asthma, Glaucoma, HTN    Patient Stated Goals Be able to walk;    Currently in Pain? No/denies                             [facility] Adult PT Treatment/Exercise - 12/21/20 1034      Transfers   Transfers Sit to Stand;Stand to [employer] Transfers    Sit to Stand 4: Min guard;With upper extremity assist    Stand to Sit 4: Min guard;With upper extremity assist;To bed;To chair/3-in-1    Comments multiple reps performed throughout session from manual w/c      Ambulation/Gait   Ambulation/Gait Yes    Ambulation/Gait Assistance 4: Min guard    Ambulation/Gait Assistance Details 2 episodes of min A for balance due to  decr L foot clearance during 2nd lap, pt might benefit from a toe cap potentially    Ambulation Distance (Feet) 230 Feet   x1   Assistive device Rolling walker;Other (Comment)    Gait Pattern Step-to pattern;Step-through pattern;Decreased step length - right;Decreased step length - left;Decreased stance time - left;Decreased hip/knee flexion - left;Decreased stride length;Decreased weight shift to left;Left flexed knee in stance;Antalgic;Trunk flexed;Narrow base of support    Ambulation Surface Level;Indoor    Stairs Yes    Stairs Assistance 4: Min guard;4: Min assist    Stairs Assistance Details (indicate cue type and reason) min guard/min A for with B handrails, initial cues for sequencing and weight shifting to descend steps    Stair Management Technique Two rails;Step to pattern;Forwards    Number of Stairs 4   x2   Height of Stairs 6      Knee/Hip Exercises: Standing   Forward Step Up Right;1 set;Hand Hold: 2;Step Height: 4"    Forward Step Up Limitations 6 reps leading with RLE, cues for tall posture in standing before stepping back down  PT Short Term Goals - 12/12/20 4782      PT SHORT TERM GOAL #1   Title Patient/family member will report complteing household ambulation with RW at least 2x/day (All STGs Due: 01/02/21)    Baseline not currently ambulating in the home    Time 3    Period Weeks    Status Partially Met    Target Date 01/02/21      PT SHORT TERM GOAL #2   Title Patient will improve 5x sit <> stand to </= 30 seconds with UE support to demonstrate improved balance    Baseline 35.97 secs    Time 3    Period Weeks    Status New      PT SHORT TERM GOAL #3   Title Patient will improve TUG to </= to 68 seconds to demonstrate improved balance and reduced fall risk    Baseline 73.41 secs    Time 3    Period Weeks    Status New             PT Long Term Goals - 12/09/20 2016      PT LONG TERM GOAL #1   Title Patient will be  independent with final HEP with caregiver assistance, and report compliance with daily walking within the home with assistance (ALL LTGs Due: 01/23/21)    Baseline 12/09/20: current program was reviewed and updated this session, will benefit from further program advancement as pt progresses    Time 6    Period Weeks    Status Revised    Target Date 01/23/21      PT LONG TERM GOAL #2   Title Patient will be able to ambulate >/= 200 ft on indoor level surfaces with CGA and LRAD to demonstrate improved household mobility    Baseline 12/07/20: 115' with min guard/ocassional min A  with RW    Time 6    Period Weeks    Status Revised      PT LONG TERM GOAL #3   Title Patient will completed 5x sit <> stand in </= 25 seconds to demonstrate reduced fall risk and improved balance    Baseline 35.97 secs    Time 6    Period Weeks    Status Revised      PT LONG TERM GOAL #4   Title Patient will improve TUG to </= 60 seconds to demonstrate reduced fall risk and improved balance    Baseline T2/4/22: 73.41 sec's with RW/brace on left LE    Time 6    Period Weeks    Status Revised      PT LONG TERM GOAL #5   Title Patient will improve gait speed to >/= 0.80 ft/sec to demonstrate improved tolerance for household mobility    Baseline 0.58 ft/sec    Time 6    Period Weeks    Status New                 Plan - 12/21/20 1221    Clinical Impression Statement Able to progress gait distance today to 230' in a single bout with use of L anterior ottobock with min guard and 2 episodes of min A towards end of bout with decr L foot clearance. Continued stair training with pt able to perform 2 sets of 4 steps this session with B handrails and min guard/min A. Seated rest breaks taken as appropriately. Will continue to progress towards LTGs.    Personal Factors and Comorbidities Comorbidity 3+;Time  since onset of injury/illness/exacerbation;Age    Comorbidities R Basal Ganglia CVA, Hyperlipidemia,  Hypothyroidism, Asthma, Glaucoma, HTN    Examination-Activity Limitations Bed Mobility;Dressing;Locomotion Level;Reach Overhead;Stairs;Stand;Transfers    Examination-Participation Restrictions Armed forces logistics/support/administrative officer Evolving/Moderate complexity    Rehab Potential Fair    PT Frequency 2x / week    PT Duration 6 weeks    PT Treatment/Interventions ADLs/Self Care Home Management;Cryotherapy;Electrical Stimulation;Moist Heat;DME Instruction;Gait training;Stair training;Functional mobility training;Therapeutic activities;Therapeutic exercise;Balance training;Neuromuscular re-education;Patient/family education;Orthotic Fit/Training;Manual techniques;Passive range of motion    PT Next Visit Plan continue to work on LE strengthening. stair training. continue with use of anterior Ottobock brace. Gable Odonohue texted chris to see when brace would be delivered - waiting to hear back. Continue to work on standing posture and weight shifitng.    PT Home Exercise Plan Access Code: [email or web address]    Consulted and Agree with Plan of Care Patient           Patient will benefit from skilled therapeutic intervention in order to improve the following deficits and impairments:  Abnormal gait,Decreased balance,Decreased mobility,Decreased endurance,Difficulty walking,Impaired tone,Impaired sensation,Pain,Decreased strength,Decreased safety awareness,Decreased knowledge of use of DME,Decreased coordination,Decreased activity tolerance,Decreased range of motion  Visit Diagnosis: Hemiplegia and hemiparesis following cerebral infarction affecting left non-dominant side (HCC)  Muscle weakness (generalized)  Other abnormalities of gait and mobility  Unsteadiness on feet  Difficulty in walking, not elsewhere classified     Problem List Patient Active Problem List   Diagnosis Date Noted  . Spasticity 11/30/2020  . Wheelchair dependence 10/07/2020  . Left knee pain 07/26/2020  .  Cerebrovascular accident (CVA) of right basal ganglia ([place]) 07/26/2020  . Left hemiparesis ([place]) 07/25/2020  . Hypokalemia 07/20/2020  . Right basal ganglia embolic stroke ([place]) 76/28/3151  . Essential hypertension 07/20/2020  . Hypothyroidism 07/20/2020    Arliss Journey, PT, DPT  12/21/2020, 12:23 PM  [facility] [address] [place], [place], [address] Phone: [phone]   Fax:  [phone]  Name: Nicole [name] MRN: [id number] Date of Birth: 05/09/1930

## 2020-12-23 ENCOUNTER — Encounter: Payer: Self-pay | Admitting: Occupational Therapy

## 2020-12-23 ENCOUNTER — Ambulatory Visit: Payer: Medicare Other | Admitting: Occupational Therapy

## 2020-12-23 ENCOUNTER — Encounter: Payer: Self-pay | Admitting: Physical Therapy

## 2020-12-23 ENCOUNTER — Other Ambulatory Visit: Payer: Self-pay

## 2020-12-23 ENCOUNTER — Ambulatory Visit: Payer: Medicare Other | Admitting: Physical Therapy

## 2020-12-23 DIAGNOSIS — R2689 Other abnormalities of gait and mobility: Secondary | ICD-10-CM

## 2020-12-23 DIAGNOSIS — M25512 Pain in left shoulder: Secondary | ICD-10-CM | POA: Diagnosis not present

## 2020-12-23 DIAGNOSIS — I69354 Hemiplegia and hemiparesis following cerebral infarction affecting left non-dominant side: Secondary | ICD-10-CM | POA: Diagnosis not present

## 2020-12-23 DIAGNOSIS — M25612 Stiffness of left shoulder, not elsewhere classified: Secondary | ICD-10-CM | POA: Diagnosis not present

## 2020-12-23 DIAGNOSIS — R2681 Unsteadiness on feet: Secondary | ICD-10-CM | POA: Diagnosis not present

## 2020-12-23 DIAGNOSIS — R262 Difficulty in walking, not elsewhere classified: Secondary | ICD-10-CM

## 2020-12-23 DIAGNOSIS — M6281 Muscle weakness (generalized): Secondary | ICD-10-CM

## 2020-12-23 DIAGNOSIS — R278 Other lack of coordination: Secondary | ICD-10-CM | POA: Diagnosis not present

## 2020-12-23 NOTE — Therapy (Signed)
[facility] [address] [place], [place], [address] Phone: [phone]   Fax:  [phone]  Occupational Therapy Treatment  Patient Details  Name: Nicole [name] MRN: [id number] Date of Birth: July 01, 1930 Referring Provider (OT): Courtney Heys, MD   Encounter Date: 12/23/2020   OT End of Session - 12/23/20 1016    Visit Number 13    Number of Visits 17    Date for OT Re-Evaluation 01/04/21    Authorization Time Period $40 Copay VL:MN No Auth required  10th visit PN  Week 7/8 (12/16/20)    Authorization - Visit Number 13    Authorization - Number of Visits 17    Progress Note Due on Visit 19    OT Start Time 1015    OT Stop Time 1057    OT Time Calculation (min) 42 min    Activity Tolerance Patient tolerated treatment well    Behavior During Therapy WFL for tasks assessed/performed           Past Medical History:  Diagnosis Date  . Hypertension   . Stroke [facility])    September 2021  . Thyroid disease     History reviewed. No pertinent surgical history.  There were no vitals filed for this visit.   Subjective Assessment - 12/23/20 1017    Subjective  Pt denies any pain. Pt says "no plans" for the weekend    Patient is accompanied by: Family member   caregiver   Pertinent History CVA R basal ganglia, HTN, HLD, hypothyroidism, asthma, glaucoma    Currently in Pain? No/denies                        OT Treatments/Exercises (OP) - 12/23/20 1049      ADLs   ADL Comments see STGs and LTGs for updates      Functional Reaching Activities   Low Level lifting up with resistance clothespins to approx 5-6" with LUE with max facilitation for position and posture for limiting shoulder hiking, lateral flexion and neck lateral flexion for compensation. Pt did low leve lreaching for placing cylindrical color pegs into vertical slated surface with mod facilitation      Fine Motor Coordination (Hand/Wrist)    Fine Motor Coordination Picking up coins;Stacking coins    Picking up coins with LUE and placing into slot with good shoulder movement and coordination - limited hiking and lateral flexion for compensation with activity    Stacking coins stacked coins into stacks of 5 with LUE with increased time and min difficulty                    OT Short Term Goals - 12/23/20 1022      OT SHORT TERM GOAL #1   Title Pt will be independent with HEP (12/07/20)    Time 4    Period Weeks    Status Achieved    Target Date 12/07/20      OT SHORT TERM GOAL #2   Title Pt will improve grip strength in LUE by 3 lbs or greater for increase in functional use of LUE and increase independence with clothing management.    Baseline RUE 42.1 LUE 26.8    Time 4    Period Weeks    Status Achieved   37.9 LUE     OT SHORT TERM GOAL #3   Title Pt will obtain item from mid level shelf with increase in shoulder  flexion to 70 degrees with good positioning and reduction in shoulder hiking and compensatory movements.    Baseline LUE 66 degrees sh flexion    Time 4    Period Weeks    Status Achieved   90* on 12/23/2020     OT SHORT TERM GOAL #4   Title Pt will demonstrate improved box and blocks score to 35 or greater with LUE for increase in functional use of LUE.    Baseline 29 LUE    Time 4    Period Weeks    Status On-going   12/07/20 - 28 blocks     OT SHORT TERM GOAL #5   Title Pt will verbalize understanding of sleep positions for LUE to decrease pain.    Baseline 6/10 LUE shoulder pain at night    Time 4    Period Weeks    Status Achieved      OT SHORT TERM GOAL #6   Title Pt will don bra with mod I    Baseline daughter currently assisting    Time 4    Period Weeks    Status Achieved   12/07/20 - pt reports donning bra all clothing            OT Long Term Goals - 12/23/20 1024      OT LONG TERM GOAL #1   Title Pt will be independent with updated HEP 01/04/2021    Time 8    Period Weeks     Status On-going      OT LONG TERM GOAL #2   Title Pt will demonstrate increased grip strength in LUE to 33 lbs or greater for improved functional use.    Baseline RUE 42.1 LUE 26.8    Time 8    Period Weeks    Status Achieved   37.9 lbs LUE 12/07/20     OT LONG TERM GOAL #3   Title Pt will perform UB and LB dressing with mod I.    Time 8    Period Weeks    Status Achieved   pt and caregiver report pt is doing all dressing except shower days     OT LONG TERM GOAL #4   Title Pt will increase fine motor coordination by completing 9 hole peg test in 50 seconds or less with LUE. Upgraded 12/23/2020: 45seconds or less with LUE    Baseline RUE 27.47 LUE 61.87    Time 8    Period Weeks    Status Revised   12/23/2020: 50.19s     OT LONG TERM GOAL #5   Title Pt will improve shoulder flexion to at least 100 degrees in order to obtain object and increase functional reach.    Baseline LUE 66 degrees sh flexion    Time 8    Period Weeks    Status On-going   90* on 12/23/2020     OT LONG TERM GOAL #6   Title Pt will report decrease in pain in LUE shoulder at night to less than or equal to 4/10 with improved sleep positioning and range of motion    Time 8    Period Weeks    Status Achieved                 Plan - 12/23/20 1052    Clinical Impression Statement Pt continues to progress towards goals. Pt has met 5/6 STGs and 4/6 LTGs and continuing to progress towards remaining goals.    OT Occupational  Profile and History Problem Focused Assessment - Including review of records relating to presenting problem    Occupational performance deficits (Please refer to evaluation for details): ADL's;IADL's    Body Structure / Function / Physical Skills ADL;Balance;Coordination;IADL;Pain;GMC;Hearing;Strength;Tone;Sensation;Dexterity;Decreased knowledge of use of DME;FMC;UE functional use;ROM;Flexibility    Cognitive Skills Attention;Sequencing;Problem Solve    OT Frequency 2x / week    OT  Duration 8 weeks    OT Treatment/Interventions Self-care/ADL training;Cryotherapy;Ultrasound;Moist Heat;Electrical Stimulation;Paraffin;Energy conservation;DME and/or AE instruction;Manual Therapy;Functional Mobility Training;Neuromuscular education;Therapeutic exercise;Visual/perceptual remediation/compensation;Patient/family education;Balance training;Therapeutic activities;Passive range of motion;Cognitive remediation/compensation    Plan sidelying: scapula mobility, neuro re-educ (closed chain high level), recert on 4/25 for 2x/week for 3-4 additional weeks?    Consulted and Agree with Plan of Care Family member/caregiver;Patient    Family Member Consulted caregiever Pam           Patient will benefit from skilled therapeutic intervention in order to improve the following deficits and impairments:   Body Structure / Function / Physical Skills: ADL,Balance,Coordination,IADL,Pain,GMC,Hearing,Strength,Tone,Sensation,Dexterity,Decreased knowledge of use of DME,FMC,UE functional use,ROM,Flexibility Cognitive Skills: Attention,Sequencing,Problem Solve     Visit Diagnosis: Other lack of coordination  Muscle weakness (generalized)  Hemiplegia and hemiparesis following cerebral infarction affecting left non-dominant side (HCC)  Stiffness of left shoulder, not elsewhere classified    Problem List Patient Active Problem List   Diagnosis Date Noted  . Spasticity 11/30/2020  . Wheelchair dependence 10/07/2020  . Left knee pain 07/26/2020  . Cerebrovascular accident (CVA) of right basal ganglia ([place]) 07/26/2020  . Left hemiparesis ([place]) 07/25/2020  . Hypokalemia 07/20/2020  . Right basal ganglia embolic stroke ([place]) 95/63/8756  . Essential hypertension 07/20/2020  . Hypothyroidism 07/20/2020    Zachery Conch MOT, OTR/L  12/23/2020, 11:02 AM  [facility] [address] [place], [place], [address] Phone: [phone]    Fax:  [phone]  Name: Nicole [name] MRN: [id number] Date of Birth: [birth year]

## 2020-12-26 NOTE — Therapy (Signed)
[facility] [address] [place], [place], [address] Phone: [phone]   Fax:  [phone]  Physical Therapy Treatment  Patient Details  Name: Nicole [name] MRN: [id number] Date of Birth: 05-Dec-1929 Referring Provider (PT): Courtney Heys, MD   Encounter Date: 12/23/2020     12/23/20 0935  PT Visits / Re-Eval  Visit Number 17  Number of Visits 25  Date for PT Re-Evaluation 02/10/21 (POC for 6 weeks, Cert for 60 days)  Authorization  Authorization Type BCBS Medicare (10th Visit PN)  Progress Note Due on Visit 20  PT Time Calculation  PT Start Time 0933  PT Stop Time 1015  PT Time Calculation (min) 42 min  PT - End of Session  Equipment Utilized During Treatment Gait belt  Activity Tolerance Patient tolerated treatment well  Behavior During Therapy [facility] for tasks assessed/performed    Past Medical History:  Diagnosis Date  . Hypertension   . Stroke [facility])    September 2021  . Thyroid disease     History reviewed. No pertinent surgical history.  There were no vitals filed for this visit.     12/23/20 0935  Symptoms/Limitations  Subjective No changes. Things are going well at home.  Patient is accompained by: Family member (CNA, Pam)  Pertinent History R Basal Ganglia CVA, Hyperlipidemia, Hypothyroidism, Asthma, Glaucoma, HTN  Limitations Standing;Walking  Patient Stated Goals Be able to walk;  Pain Assessment  Currently in Pain? No/denies  Pain Score 0      12/23/20 0936  Transfers  Transfers Sit to Stand;Stand to Sit;Stand Pivot Transfers  Sit to Stand 4: Min guard;With upper extremity assist  Stand to Sit 4: Min guard;With upper extremity assist;To bed;To chair/3-in-1  Ambulation/Gait  Ambulation/Gait Yes  Ambulation/Gait Assistance 4: Min guard  Ambulation/Gait Assistance Details no physical assist needed with first gait rep with left toes noted to catch at times. added simulated toe cap for  remainder of gait/session. no toe scuffing noted, however occasional reminder cues needed to advance left LE fully as pt tended to "leave it behind" as she fatigued.  Ambulation Distance (Feet) 115 Feet (x1, 230 x1, plus around gym with session)  Assistive device Rolling walker;Other (Comment)  Gait Pattern Step-to pattern;Step-through pattern;Decreased step length - right;Decreased step length - left;Decreased stance time - left;Decreased hip/knee flexion - left;Decreased stride length;Decreased weight shift to left;Left flexed knee in stance;Antalgic;Trunk flexed;Narrow base of support  Ambulation Surface Level;Indoor  Stairs Yes  Stairs Assistance 4: Min guard;4: Min assist  Stairs Assistance Details (indicate cue type and reason) reminder cues on technique, to advance hands along the rails, for weight shifting. increased assist to descend stairs.  Stair Management Technique Two rails;One rail Right;Step to pattern;Forwards;Sideways  Number of Stairs 4 (x1 rep each)  Height of Stairs 6     12/23/20 1006  Balance Exercises: Standing  Standing Eyes Opened Wide (BOA);Head turns;Foam/compliant surface;Other reps (comment);Limitations  Standing Eyes Opened Limitations on airex with no UE support, cues on posture and slow/small head movements left<>right, up<>down.  Standing Eyes Closed Wide (BOA);Foam/compliant surface;3 reps;20 secs;Limitations  Standing Eyes Closed Limitations on airex with no UE support. min to mod assist as pt tended to lean posterior. cues needed on posture/weight shifting to correct balance.           PT Short Term Goals - 12/12/20 5809      PT SHORT TERM GOAL #1   Title Patient/family member will report complteing household ambulation with RW at  least 2x/day (All STGs Due: 01/02/21)    Baseline not currently ambulating in the home    Time 3    Period Weeks    Status Partially Met    Target Date 01/02/21      PT SHORT TERM GOAL #2   Title Patient will  improve 5x sit <> stand to </= 30 seconds with UE support to demonstrate improved balance    Baseline 35.97 secs    Time 3    Period Weeks    Status New      PT SHORT TERM GOAL #3   Title Patient will improve TUG to </= to 68 seconds to demonstrate improved balance and reduced fall risk    Baseline 73.41 secs    Time 3    Period Weeks    Status New             PT Long Term Goals - 12/09/20 2016      PT LONG TERM GOAL #1   Title Patient will be independent with final HEP with caregiver assistance, and report compliance with daily walking within the home with assistance (ALL LTGs Due: 01/23/21)    Baseline 12/09/20: current program was reviewed and updated this session, will benefit from further program advancement as pt progresses    Time 6    Period Weeks    Status Revised    Target Date 01/23/21      PT LONG TERM GOAL #2   Title Patient will be able to ambulate >/= 200 ft on indoor level surfaces with CGA and LRAD to demonstrate improved household mobility    Baseline 12/07/20: 115' with min guard/ocassional min A  with RW    Time 6    Period Weeks    Status Revised      PT LONG TERM GOAL #3   Title Patient will completed 5x sit <> stand in </= 25 seconds to demonstrate reduced fall risk and improved balance    Baseline 35.97 secs    Time 6    Period Weeks    Status Revised      PT LONG TERM GOAL #4   Title Patient will improve TUG to </= 60 seconds to demonstrate reduced fall risk and improved balance    Baseline T2/4/22: 73.41 sec's with RW/brace on left LE    Time 6    Period Weeks    Status Revised      PT LONG TERM GOAL #5   Title Patient will improve gait speed to >/= 0.80 ft/sec to demonstrate improved tolerance for household mobility    Baseline 0.58 ft/sec    Time 6    Period Weeks    Status New              12/23/20 0935  Plan  Clinical Impression Statement Today's skilled session continued to focus on gait with RW/brace, stairs and began balance  training on a compliant surface. Rest breaks taken as needed due to fatigue. Pt did have improved foot advancement with use of simulated toe cap. Pt's brace to be delivered at next appointment. The pt is progressing toward goals and should benefit from continued PT to progress toward unmet goals.  Personal Factors and Comorbidities Comorbidity 3+;Time since onset of injury/illness/exacerbation;Age  Comorbidities R Basal Ganglia CVA, Hyperlipidemia, Hypothyroidism, Asthma, Glaucoma, HTN  Examination-Activity Limitations Bed Mobility;Dressing;Locomotion Level;Reach Overhead;Stairs;Stand;Transfers  Examination-Participation Restrictions Community Activity;Cleaning  Pt will benefit from skilled therapeutic intervention in order to improve on the following  deficits Abnormal gait;Decreased balance;Decreased mobility;Decreased endurance;Difficulty walking;Impaired tone;Impaired sensation;Pain;Decreased strength;Decreased safety awareness;Decreased knowledge of use of DME;Decreased coordination;Decreased activity tolerance;Decreased range of motion  Stability/Clinical Decision Making Evolving/Moderate complexity  Rehab Potential Fair  PT Frequency 2x / week  PT Duration 6 weeks  PT Treatment/Interventions ADLs/Self Care Home Management;Cryotherapy;Electrical Stimulation;Moist Heat;DME Instruction;Gait training;Stair training;Functional mobility training;Therapeutic activities;Therapeutic exercise;Balance training;Neuromuscular re-education;Patient/family education;Orthotic Fit/Training;Manual techniques;Passive range of motion  PT Next Visit Plan continue to work on LE strengthening. stair training. continue with use of anterior Ottobock brace. Pt's brace to be delived on 12/27/20.- . Continue to work on standing posture and weight shifitng, balance on compliant surfaces. ? toe cap once pt has her brace  PT Home Exercise Plan Access Code: [email or web address]  Consulted and Agree with Plan of Care Patient          Patient will benefit from skilled therapeutic intervention in order to improve the following deficits and impairments:  Abnormal gait,Decreased balance,Decreased mobility,Decreased endurance,Difficulty walking,Impaired tone,Impaired sensation,Pain,Decreased strength,Decreased safety awareness,Decreased knowledge of use of DME,Decreased coordination,Decreased activity tolerance,Decreased range of motion  Visit Diagnosis: Muscle weakness (generalized)  Other abnormalities of gait and mobility  Unsteadiness on feet  Difficulty in walking, not elsewhere classified     Problem List Patient Active Problem List   Diagnosis Date Noted  . Spasticity 11/30/2020  . Wheelchair dependence 10/07/2020  . Left knee pain 07/26/2020  . Cerebrovascular accident (CVA) of right basal ganglia ([place]) 07/26/2020  . Left hemiparesis ([place]) 07/25/2020  . Hypokalemia 07/20/2020  . Right basal ganglia embolic stroke ([place]) 98/08/2547  . Essential hypertension 07/20/2020  . Hypothyroidism 07/20/2020    Willow Ora, PTA, [facility] [address] [place], [place] [address] [phone] 12/26/20, 10:32 PM   Name: Nicole [name] MRN: [id number] Date of Birth: [birth year]

## 2020-12-27 ENCOUNTER — Other Ambulatory Visit: Payer: Self-pay

## 2020-12-27 ENCOUNTER — Ambulatory Visit: Payer: Medicare Other | Admitting: Occupational Therapy

## 2020-12-27 ENCOUNTER — Ambulatory Visit: Payer: Medicare Other

## 2020-12-27 DIAGNOSIS — R2689 Other abnormalities of gait and mobility: Secondary | ICD-10-CM | POA: Diagnosis not present

## 2020-12-27 DIAGNOSIS — R2681 Unsteadiness on feet: Secondary | ICD-10-CM

## 2020-12-27 DIAGNOSIS — M25612 Stiffness of left shoulder, not elsewhere classified: Secondary | ICD-10-CM | POA: Diagnosis not present

## 2020-12-27 DIAGNOSIS — M25512 Pain in left shoulder: Secondary | ICD-10-CM | POA: Diagnosis not present

## 2020-12-27 DIAGNOSIS — I69354 Hemiplegia and hemiparesis following cerebral infarction affecting left non-dominant side: Secondary | ICD-10-CM

## 2020-12-27 DIAGNOSIS — M6281 Muscle weakness (generalized): Secondary | ICD-10-CM

## 2020-12-27 DIAGNOSIS — R278 Other lack of coordination: Secondary | ICD-10-CM | POA: Diagnosis not present

## 2020-12-27 DIAGNOSIS — R262 Difficulty in walking, not elsewhere classified: Secondary | ICD-10-CM | POA: Diagnosis not present

## 2020-12-27 NOTE — Therapy (Signed)
[facility] [address] [place], [place], [address] Phone: [phone]   Fax:  [phone]  Physical Therapy Treatment  Patient Details  Name: Nicole [name] MRN: [id number] Date of Birth: 02/28/1930 Referring Provider (PT): Megan Lovorn, MD   Encounter Date: 12/27/2020   PT End of Session - 12/27/20 1016    Visit Number 18    Number of Visits 25    Date for PT Re-Evaluation 02/10/21   POC for 6 weeks, Cert for 60 days   Authorization Type BCBS Medicare (10th Visit PN)    Progress Note Due on Visit 20    PT Start Time 1015    PT Stop Time 1059    PT Time Calculation (min) 44 min    Equipment Utilized During Treatment Gait belt    Activity Tolerance Patient tolerated treatment well    Behavior During Therapy WFL for tasks assessed/performed           Past Medical History:  Diagnosis Date  . Hypertension   . Stroke ([facility])    September 2021  . Thyroid disease     History reviewed. No pertinent surgical history.  There were no vitals filed for this visit.   Subjective Assessment - 12/27/20 1016    Subjective No new changes complaints. No pain to report. Daughter reports that she did trip once but no injuries. Chris from [employer] present with Brace.    Patient is accompained by: Family member   CNA, Pam   Pertinent History R Basal Ganglia CVA, Hyperlipidemia, Hypothyroidism, Asthma, Glaucoma, HTN    Limitations Standing;Walking    Patient Stated Goals Be able to walk;    Currently in Pain? No/denies               [facility] Adult PT Treatment/Exercise - 12/27/20 0001      Transfers   Transfers Sit to Stand;Stand to Sit;Stand Pivot Transfers    Sit to Stand 4: Min guard;With upper extremity assist    Stand to Sit 4: Min guard;With upper extremity assist;To bed;To chair/3-in-1    Stand Pivot Transfers 5: Supervision    Stand Pivot Transfer Details (indicate cue type and reason) completed transfer from w/c <>  mat x 2 with 2-3 steps between with supervision, patient able to complete without cueing and improved safety noted with brace donned.    Comments compelted sit <> stands with UE support from mat.      Ambulation/Gait   Ambulation/Gait Yes    Ambulation/Gait Assistance 4: Min guard    Ambulation/Gait Assistance Details completed ambulation with personal AFO donned, completed ambulation 115 x 1, 230 ft x 1. With 230 ft incrased fatigue noted at end of ambulation, require verbal cues for increased step length on RLE.    Ambulation Distance (Feet) 115 Feet   x , 230 x 1   Assistive device Rolling walker;Other (Comment)    Gait Pattern Step-to pattern;Step-through pattern;Decreased step length - right;Decreased step length - left;Decreased stance time - left;Decreased hip/knee flexion - left;Decreased stride length;Decreased weight shift to left;Left flexed knee in stance;Antalgic;Trunk flexed;Narrow base of support    Ambulation Surface Level;Indoor    Stairs Yes    Stairs Assistance 4: Min guard;4: Min assist    Stairs Assistance Details (indicate cue type and reason) cues for sequencing required, as well as advancing UE on rails. increased challenge and min A required for descent.    Stair Management Technique Two rails;Step to pattern;Forwards      Number of Stairs 4    Height of Stairs 6      Self-Care   Self-Care Other Self-Care Comments    Other Self-Care Comments  PT educating on skin monitoring and wear schedule of AFO. Chris from [employer] educating on how to donn/doff brace.      Exercises   Exercises Other Exercises    Other Exercises  Seated: completed alternating marching with yellow therapy band around thighs, completed 2 x 10 reps with short rest break between. With pillowcase under LLE, completed seated heel slides 2 x 10 reps.              PT Education - 12/27/20 1022    Education Details See Self Care Section    Person(s) Educated Patient;Child(ren)    Methods Explanation     Comprehension Verbalized understanding            PT Short Term Goals - 12/12/20 0923      PT SHORT TERM GOAL #1   Title Patient/family member will report complteing household ambulation with RW at least 2x/day (All STGs Due: 01/02/21)    Baseline not currently ambulating in the home    Time 3    Period Weeks    Status Partially Met    Target Date 01/02/21      PT SHORT TERM GOAL #2   Title Patient will improve 5x sit <> stand to </= 30 seconds with UE support to demonstrate improved balance    Baseline 35.97 secs    Time 3    Period Weeks    Status New      PT SHORT TERM GOAL #3   Title Patient will improve TUG to </= to 68 seconds to demonstrate improved balance and reduced fall risk    Baseline 73.41 secs    Time 3    Period Weeks    Status New             PT Long Term Goals - 12/09/20 2016      PT LONG TERM GOAL #1   Title Patient will be independent with final HEP with caregiver assistance, and report compliance with daily walking within the home with assistance (ALL LTGs Due: 01/23/21)    Baseline 12/09/20: current program was reviewed and updated this session, will benefit from further program advancement as pt progresses    Time 6    Period Weeks    Status Revised    Target Date 01/23/21      PT LONG TERM GOAL #2   Title Patient will be able to ambulate >/= 200 ft on indoor level surfaces with CGA and LRAD to demonstrate improved household mobility    Baseline 12/07/20: 115' with min guard/ocassional min A  with RW    Time 6    Period Weeks    Status Revised      PT LONG TERM GOAL #3   Title Patient will completed 5x sit <> stand in </= 25 seconds to demonstrate reduced fall risk and improved balance    Baseline 35.97 secs    Time 6    Period Weeks    Status Revised      PT LONG TERM GOAL #4   Title Patient will improve TUG to </= 60 seconds to demonstrate reduced fall risk and improved balance    Baseline T2/4/22: 73.41 sec's with RW/brace on left LE     Time 6    Period Weeks    Status Revised        PT LONG TERM GOAL #5   Title Patient will improve gait speed to >/= 0.80 ft/sec to demonstrate improved tolerance for household mobility    Baseline 0.58 ft/sec    Time 6    Period Weeks    Status New                 Plan - 12/27/20 1101    Clinical Impression Statement Patient's AFO delivered during session. Continued gait training and stair training with patient's personal AFO donned, demonstrating improved gait pattern noted. PT educating on wear schedule and how to adequately assess skin as we begin to wear personal AFO at home. Will continue to benefit from skilled PT services to progress toward all LTGs.    Personal Factors and Comorbidities Comorbidity 3+;Time since onset of injury/illness/exacerbation;Age    Comorbidities R Basal Ganglia CVA, Hyperlipidemia, Hypothyroidism, Asthma, Glaucoma, HTN    Examination-Activity Limitations Bed Mobility;Dressing;Locomotion Level;Reach Overhead;Stairs;Stand;Transfers    Examination-Participation Restrictions Armed forces logistics/support/administrative officer Evolving/Moderate complexity    Rehab Potential Fair    PT Frequency 2x / week    PT Duration 6 weeks    PT Treatment/Interventions ADLs/Self Care Home Management;Cryotherapy;Electrical Stimulation;Moist Heat;DME Instruction;Gait training;Stair training;Functional mobility training;Therapeutic activities;Therapeutic exercise;Balance training;Neuromuscular re-education;Patient/family education;Orthotic Fit/Training;Manual techniques;Passive range of motion    PT Next Visit Plan Check STGs. Continue to work on LE strengthening. stair training. continue with use of anterior Ottobock brace. Continue to work on standing posture and weight shifitng, balance on compliant surfaces. Potential for toe cap due to having carpet in house.    PT Home Exercise Plan Access Code: [email or web address]    Consulted and Agree with Plan of Care  Patient           Patient will benefit from skilled therapeutic intervention in order to improve the following deficits and impairments:  Abnormal gait,Decreased balance,Decreased mobility,Decreased endurance,Difficulty walking,Impaired tone,Impaired sensation,Pain,Decreased strength,Decreased safety awareness,Decreased knowledge of use of DME,Decreased coordination,Decreased activity tolerance,Decreased range of motion  Visit Diagnosis: Muscle weakness (generalized)  Other abnormalities of gait and mobility  Unsteadiness on feet  Difficulty in walking, not elsewhere classified     Problem List Patient Active Problem List   Diagnosis Date Noted  . Spasticity 11/30/2020  . Wheelchair dependence 10/07/2020  . Left knee pain 07/26/2020  . Cerebrovascular accident (CVA) of right basal ganglia ([place]) 07/26/2020  . Left hemiparesis ([place]) 07/25/2020  . Hypokalemia 07/20/2020  . Right basal ganglia embolic stroke ([place]) 38/25/0539  . Essential hypertension 07/20/2020  . Hypothyroidism 07/20/2020    Jones Bales, PT, DPT 12/27/2020, 12:39 PM  [facility] [address] [place], [place], [address] Phone: [phone]   Fax:  [phone]  Name: Nicole [name] MRN: [id number] Date of Birth: [birth year]

## 2020-12-27 NOTE — Patient Instructions (Signed)
Flexion (Assistive)    Lying down: clasp hands together and raise arms above head, keeping elbows as straight as possible. Repeat _10___ times. Do _2___ sessions per day.  SHOULDER: Flexion Bilateral    LYING DOWN holding paper towel roll or shoe box, Raise arms overhead at same speed. Keep elbows straight. _10__ reps per set, _2__ sets per day   Use Lt arm to help:  Bathe Fold towels Pull up pants Wipe table Pick up cup and bring to mouth Place checkers or cotton balls in cup Place checkers into Connect 4  Do NOT use for anything: heavy, sharp, breakable, or hot

## 2020-12-27 NOTE — Therapy (Signed)
[facility] [address] [place], [place], [address] Phone: [phone]   Fax:  [phone]  Occupational Therapy Treatment  Patient Details  Name: Nicole [name] MRN: [id number] Date of Birth: 06-16-1930 Referring Provider (OT): Courtney Heys, MD   Encounter Date: 12/27/2020   OT End of Session - 12/27/20 1514    Visit Number 14    Number of Visits 17    Date for OT Re-Evaluation 01/04/21    Authorization Time Period $40 Copay VL:MN No Auth required  10th visit PN  Week 7/8 (12/16/20)    Authorization - Visit Number 14    Authorization - Number of Visits 17    Progress Note Due on Visit 19    OT Start Time 0930    OT Stop Time 1015    OT Time Calculation (min) 45 min    Activity Tolerance Patient tolerated treatment well    Behavior During Therapy [facility] for tasks assessed/performed           Past Medical History:  Diagnosis Date  . Hypertension   . Stroke [facility])    September 2021  . Thyroid disease     No past surgical history on file.  There were no vitals filed for this visit.   Subjective Assessment - 12/27/20 0934    Subjective  I've been trying to work my arm more    Patient is accompanied by: Family member   caregiver   Pertinent History CVA R basal ganglia, HTN, HLD, hypothyroidism, asthma, glaucoma    Currently in Pain? No/denies           Sidelying: AA/ROM in scapula retraction and protraction and sh flex/ext w/ scapula mobilization for upward and downward rotation.  Supine: worked on BUE sh flexion holding thick dowel (palms facing) w/ min facilitation LUE.  Pt encouraged to do self ROM in flex/ext and previous ex supine in bed at home in the am and pm. Seated: closed chain activities LUE using UE Ranger, followed by reciprocal closed chain movement  Discussed functional tasks she can do at home for low range functional reaching and coordination - see pt instructions for details.                        OT Education - 12/27/20 1009    Education Details shoulder HEP, functional tasks for LUE    Person(s) Educated Patient;Child(ren)    Methods Explanation;Demonstration;Handout;Verbal cues    Comprehension Verbalized understanding;Returned demonstration;Verbal cues required            OT Short Term Goals - 12/23/20 1022      OT SHORT TERM GOAL #1   Title Pt will be independent with HEP (12/07/20)    Time 4    Period Weeks    Status Achieved    Target Date 12/07/20      OT SHORT TERM GOAL #2   Title Pt will improve grip strength in LUE by 3 lbs or greater for increase in functional use of LUE and increase independence with clothing management.    Baseline RUE 42.1 LUE 26.8    Time 4    Period Weeks    Status Achieved   37.9 LUE     OT SHORT TERM GOAL #3   Title Pt will obtain item from mid level shelf with increase in shoulder flexion to 70 degrees with good positioning and reduction in shoulder hiking and compensatory movements.  Baseline LUE 66 degrees sh flexion    Time 4    Period Weeks    Status Achieved   90* on 12/23/2020     OT SHORT TERM GOAL #4   Title Pt will demonstrate improved box and blocks score to 35 or greater with LUE for increase in functional use of LUE.    Baseline 29 LUE    Time 4    Period Weeks    Status On-going   12/07/20 - 28 blocks     OT SHORT TERM GOAL #5   Title Pt will verbalize understanding of sleep positions for LUE to decrease pain.    Baseline 6/10 LUE shoulder pain at night    Time 4    Period Weeks    Status Achieved      OT SHORT TERM GOAL #6   Title Pt will don bra with mod I    Baseline daughter currently assisting    Time 4    Period Weeks    Status Achieved   12/07/20 - pt reports donning bra all clothing            OT Long Term Goals - 12/23/20 1024      OT LONG TERM GOAL #1   Title Pt will be independent with updated HEP 01/04/2021    Time 8    Period Weeks    Status  On-going      OT LONG TERM GOAL #2   Title Pt will demonstrate increased grip strength in LUE to 33 lbs or greater for improved functional use.    Baseline RUE 42.1 LUE 26.8    Time 8    Period Weeks    Status Achieved   37.9 lbs LUE 12/07/20     OT LONG TERM GOAL #3   Title Pt will perform UB and LB dressing with mod I.    Time 8    Period Weeks    Status Achieved   pt and caregiver report pt is doing all dressing except shower days     OT LONG TERM GOAL #4   Title Pt will increase fine motor coordination by completing 9 hole peg test in 50 seconds or less with LUE. Upgraded 12/23/2020: 45seconds or less with LUE    Baseline RUE 27.47 LUE 61.87    Time 8    Period Weeks    Status Revised   12/23/2020: 50.19s     OT LONG TERM GOAL #5   Title Pt will improve shoulder flexion to at least 100 degrees in order to obtain object and increase functional reach.    Baseline LUE 66 degrees sh flexion    Time 8    Period Weeks    Status On-going   90* on 12/23/2020     OT LONG TERM GOAL #6   Title Pt will report decrease in pain in LUE shoulder at night to less than or equal to 4/10 with improved sleep positioning and range of motion    Time 8    Period Weeks    Status Achieved                 Plan - 12/27/20 1515    Clinical Impression Statement Pt continues to progress towards goals. Pt has met 5/6 STGs and 4/6 LTGs and continuing to progress towards remaining goals.    OT Occupational Profile and History Problem Focused Assessment - Including review of records relating to presenting problem  Occupational performance deficits (Please refer to evaluation for details): ADL's;IADL's    Body Structure / Function / Physical Skills ADL;Balance;Coordination;IADL;Pain;GMC;Hearing;Strength;Tone;Sensation;Dexterity;Decreased knowledge of use of DME;FMC;UE functional use;ROM;Flexibility    Cognitive Skills Attention;Sequencing;Problem Solve    OT Frequency 2x / week    OT Duration 8 weeks     OT Treatment/Interventions Self-care/ADL training;Cryotherapy;Ultrasound;Moist Heat;Electrical Stimulation;Paraffin;Energy conservation;DME and/or AE instruction;Manual Therapy;Functional Mobility Training;Neuromuscular education;Therapeutic exercise;Visual/perceptual remediation/compensation;Patient/family education;Balance training;Therapeutic activities;Passive range of motion;Cognitive remediation/compensation    Plan recert on 2/55 for 2x/week for 3-4 additional weeks?    Consulted and Agree with Plan of Care Family member/caregiver;Patient    Family Member Consulted caregiever Pam           Patient will benefit from skilled therapeutic intervention in order to improve the following deficits and impairments:   Body Structure / Function / Physical Skills: ADL,Balance,Coordination,IADL,Pain,GMC,Hearing,Strength,Tone,Sensation,Dexterity,Decreased knowledge of use of DME,FMC,UE functional use,ROM,Flexibility Cognitive Skills: Attention,Sequencing,Problem Solve     Visit Diagnosis: Hemiplegia and hemiparesis following cerebral infarction affecting left non-dominant side (HCC)  Muscle weakness (generalized)  Stiffness of left shoulder, not elsewhere classified    Problem List Patient Active Problem List   Diagnosis Date Noted  . Spasticity 11/30/2020  . Wheelchair dependence 10/07/2020  . Left knee pain 07/26/2020  . Cerebrovascular accident (CVA) of right basal ganglia ([place]) 07/26/2020  . Left hemiparesis ([place]) 07/25/2020  . Hypokalemia 07/20/2020  . Right basal ganglia embolic stroke ([place]) 25/89/4834  . Essential hypertension 07/20/2020  . Hypothyroidism 07/20/2020    Carey Bullocks, OTR/L 12/27/2020, 3:16 PM  [facility] [address] [place], [place], [address] Phone: [phone]   Fax:  [phone]  Name: Nicole [name] MRN: [id number] Date of Birth: Aug 30, 1930

## 2020-12-30 ENCOUNTER — Encounter: Payer: Self-pay | Admitting: Occupational Therapy

## 2020-12-30 ENCOUNTER — Ambulatory Visit: Payer: Medicare Other | Admitting: Occupational Therapy

## 2020-12-30 ENCOUNTER — Other Ambulatory Visit: Payer: Self-pay

## 2020-12-30 ENCOUNTER — Ambulatory Visit: Payer: Medicare Other

## 2020-12-30 DIAGNOSIS — M6281 Muscle weakness (generalized): Secondary | ICD-10-CM

## 2020-12-30 DIAGNOSIS — R2689 Other abnormalities of gait and mobility: Secondary | ICD-10-CM

## 2020-12-30 DIAGNOSIS — R2681 Unsteadiness on feet: Secondary | ICD-10-CM

## 2020-12-30 DIAGNOSIS — M25512 Pain in left shoulder: Secondary | ICD-10-CM | POA: Diagnosis not present

## 2020-12-30 DIAGNOSIS — I69354 Hemiplegia and hemiparesis following cerebral infarction affecting left non-dominant side: Secondary | ICD-10-CM | POA: Diagnosis not present

## 2020-12-30 DIAGNOSIS — R262 Difficulty in walking, not elsewhere classified: Secondary | ICD-10-CM | POA: Diagnosis not present

## 2020-12-30 DIAGNOSIS — R278 Other lack of coordination: Secondary | ICD-10-CM | POA: Diagnosis not present

## 2020-12-30 DIAGNOSIS — M25612 Stiffness of left shoulder, not elsewhere classified: Secondary | ICD-10-CM

## 2020-12-30 NOTE — Therapy (Signed)
[facility] [address] [place], [place], [address] Phone: [phone]   Fax:  [phone]  Physical Therapy Treatment/Progress Note  Patient Details  Name: Nicole [name] MRN: [id number] Date of Birth: 01-Dec-1929 Referring Provider (PT): Genice Rouge, MD  Physical Therapy Progress Note   Dates of Reporting Period: 12/02/2020 - 12/30/2020  See Note below for Objective Data and Assessment of Progress/Goals.  Thank you for the referral of this patient. Adelfa Koh, PT, DPT  Encounter Date: 12/30/2020   PT End of Session - 12/30/20 1012    Visit Number 19    Number of Visits 25    Date for PT Re-Evaluation 02/10/21   POC for 6 weeks, Cert for 60 days   Authorization Type [employer] Medicare    Progress Note Due on Visit 29   (Progress Note completed on 19th visit)   PT Start Time 1015    PT Stop Time 1100    PT Time Calculation (min) 45 min    Equipment Utilized During Treatment Gait belt    Activity Tolerance Patient tolerated treatment well    Behavior During Therapy WFL for tasks assessed/performed           Past Medical History:  Diagnosis Date  . Hypertension   . Stroke [facility])    September 2021  . Thyroid disease     History reviewed. No pertinent surgical history.  There were no vitals filed for this visit.   Subjective Assessment - 12/30/20 1015    Subjective No new changes/complaints. Patient reports that she has been wearing the brace, no pain or skin issues reported.    Patient is accompained by: Family member   CNA, Pam   Pertinent History R Basal Ganglia CVA, Hyperlipidemia, Hypothyroidism, Asthma, Glaucoma, HTN    Limitations Standing;Walking    Patient Stated Goals Be able to walk;    Currently in Pain? No/denies            [facility] Adult PT Treatment/Exercise - 12/30/20 0001      Transfers   Transfers Sit to Stand;Stand to Sit;Stand Pivot Transfers    Sit to Stand 4: Min guard;With upper  extremity assist    Five time sit to stand comments  28.56 seconds with UE support from mat at standard chair height    Stand to Sit 4: Min guard;With upper extremity assist;To bed;To chair/3-in-1      Ambulation/Gait   Ambulation/Gait Yes    Ambulation/Gait Assistance 4: Min guard    Ambulation/Gait Assistance Details completed ambulation with personal AFO donned on LLE and with RW x 175 ft. Increased assistance required initially but improving with increased distance. Plus completed addiontal ambulation throughout therapy session with RW with activities.    Ambulation Distance (Feet) 175 Feet    Assistive device Rolling walker    Gait Pattern Step-to pattern;Step-through pattern;Decreased step length - right;Decreased step length - left;Decreased stance time - left;Decreased hip/knee flexion - left;Decreased stride length;Decreased weight shift to left;Left flexed knee in stance;Antalgic;Trunk flexed;Narrow base of support    Ambulation Surface Level;Indoor      Standardized Balance Assessment   Standardized Balance Assessment Timed Up and Go Test      Timed Up and Go Test   TUG Normal TUG    Normal TUG (seconds) 53.21   with RW and CGA     High Level Balance   High Level Balance Activities Side stepping    High Level Balance Comments In // bars  with BUE completed side stepping x 2 laps down and back. verbal cues for improved step length with LLE and when trailing with LLE to avoid dragging and promote improved placement.      Neuro Re-ed    Neuro Re-ed Details  Completed standing at // bars: with BUE support completed steps forward with RLE to promote improved weight shift/stance on LLE, completed x 15 reps. PT providing manual faciliation at pelvis for improved alignment and weight shift. Also completed static standing withotu UE support to work toward improved balance completed x 1 minute without UE support.      Exercises   Exercises Knee/Hip      Knee/Hip Exercises: Aerobic    Other Aerobic Scifit level 1.5 for 6 minutes with BLEs only for strengthening and activity tolerance, cues to go through full range of motion, needing intermittent assist to keep LLE in proper position.              PT Education - 12/30/20 1106    Education Details Progress toward STGs    Person(s) Educated Patient    Methods Explanation    Comprehension Verbalized understanding            PT Short Term Goals - 12/30/20 1022      PT SHORT TERM GOAL #1   Title Patient/family member will report complteing household ambulation with RW at least 2x/day (All STGs Due: 01/02/21)    Baseline currently ambulating multiple times daily per patient reports, daughter not present today    Time 3    Period Weeks    Status Achieved    Target Date 01/02/21      PT SHORT TERM GOAL #2   Title Patient will improve 5x sit <> stand to </= 30 seconds with UE support to demonstrate improved balance    Baseline 35.97 secs; 28.56 secs with UE support    Time 3    Period Weeks    Status Achieved      PT SHORT TERM GOAL #3   Title Patient will improve TUG to </= to 68 seconds to demonstrate improved balance and reduced fall risk    Baseline 73.41 secs; 53.21 secs    Time 3    Period Weeks    Status Achieved             PT Long Term Goals - 12/09/20 2016      PT LONG TERM GOAL #1   Title Patient will be independent with final HEP with caregiver assistance, and report compliance with daily walking within the home with assistance (ALL LTGs Due: 01/23/21)    Baseline 12/09/20: current program was reviewed and updated this session, will benefit from further program advancement as pt progresses    Time 6    Period Weeks    Status Revised    Target Date 01/23/21      PT LONG TERM GOAL #2   Title Patient will be able to ambulate >/= 200 ft on indoor level surfaces with CGA and LRAD to demonstrate improved household mobility    Baseline 12/07/20: 115' with min guard/ocassional min A  with RW    Time  6    Period Weeks    Status Revised      PT LONG TERM GOAL #3   Title Patient will completed 5x sit <> stand in </= 25 seconds to demonstrate reduced fall risk and improved balance    Baseline 35.97 secs    Time 6  Period Weeks    Status Revised      PT LONG TERM GOAL #4   Title Patient will improve TUG to </= 60 seconds to demonstrate reduced fall risk and improved balance    Baseline T2/4/22: 73.41 sec's with RW/brace on left LE    Time 6    Period Weeks    Status Revised      PT LONG TERM GOAL #5   Title Patient will improve gait speed to >/= 0.80 ft/sec to demonstrate improved tolerance for household mobility    Baseline 0.58 ft/sec    Time 6    Period Weeks    Status New                 Plan - 12/30/20 1056    Clinical Impression Statement Today's skilled session included assessment of patient's progress toward STG. Patient able to meet all STG today demonstrating improved balance and functional mobility. Continued rest of session focused on continued gait training, and activites to promote strengthening and wieght shift onto LLE. Patient tolerating all activities well throughout therapy session. Will continue to porgress toward all LTGs.    Personal Factors and Comorbidities Comorbidity 3+;Time since onset of injury/illness/exacerbation;Age    Comorbidities R Basal Ganglia CVA, Hyperlipidemia, Hypothyroidism, Asthma, Glaucoma, HTN    Examination-Activity Limitations Bed Mobility;Dressing;Locomotion Level;Reach Overhead;Stairs;Stand;Transfers    Examination-Participation Restrictions Psychiatric nurse Evolving/Moderate complexity    Rehab Potential Fair    PT Frequency 2x / week    PT Duration 6 weeks    PT Treatment/Interventions ADLs/Self Care Home Management;Cryotherapy;Electrical Stimulation;Moist Heat;DME Instruction;Gait training;Stair training;Functional mobility training;Therapeutic activities;Therapeutic  exercise;Balance training;Neuromuscular re-education;Patient/family education;Orthotic Fit/Training;Manual techniques;Passive range of motion    PT Next Visit Plan Completed progress note on 19th visit. Talk to daughter about getting toe cap from hanger for brace due to carpet in the house. Continue to work on LE strengthening. stair training. continue with use of anterior [place] brace. Continue to work on standing posture and weight shifitng, balance on compliant surfaces.    PT Home Exercise Plan Access Code: [email or web address]    Consulted and Agree with Plan of Care Patient           Patient will benefit from skilled therapeutic intervention in order to improve the following deficits and impairments:  Abnormal gait,Decreased balance,Decreased mobility,Decreased endurance,Difficulty walking,Impaired tone,Impaired sensation,Pain,Decreased strength,Decreased safety awareness,Decreased knowledge of use of DME,Decreased coordination,Decreased activity tolerance,Decreased range of motion  Visit Diagnosis: Muscle weakness (generalized)  Other abnormalities of gait and mobility  Unsteadiness on feet  Difficulty in walking, not elsewhere classified     Problem List Patient Active Problem List   Diagnosis Date Noted  . Spasticity 11/30/2020  . Wheelchair dependence 10/07/2020  . Left knee pain 07/26/2020  . Cerebrovascular accident (CVA) of right basal ganglia (HCC) 07/26/2020  . Left hemiparesis (HCC) 07/25/2020  . Hypokalemia 07/20/2020  . Right basal ganglia embolic stroke (HCC) 07/20/2020  . Essential hypertension 07/20/2020  . Hypothyroidism 07/20/2020    Tempie Donning, PT, DPT 12/30/2020, 11:08 AM  [facility] [address] [place], [place], [address] Phone: [phone]   Fax:  [phone]  Name: Nicole [name] MRN: [id number] Date of Birth: 07-Jul-1930

## 2020-12-30 NOTE — Therapy (Signed)
[facility] [address] [place], [place], [address] Phone: [phone]   Fax:  [phone]  Occupational Therapy Treatment & Renewal  Patient Details  Name: Nicole [name] MRN: [id number] Date of Birth: [birth year] Referring Provider (OT): Courtney Heys, MD   Encounter Date: 12/30/2020   OT End of Session - 12/30/20 0933    Visit Number 15    Number of Visits 23    Date for OT Re-Evaluation 01/27/21    Authorization Time Period $40 Copay VL:MN No Auth required  10th visit PN     Authorization - Visit Number 15    Authorization - Number of Visits 23    Progress Note Due on Visit 25    OT Start Time 0932    OT Stop Time 1015    OT Time Calculation (min) 43 min    Activity Tolerance Patient tolerated treatment well    Behavior During Therapy [facility] for tasks assessed/performed           Past Medical History:  Diagnosis Date  . Hypertension   . Stroke [facility])    September 2021  . Thyroid disease     History reviewed. No pertinent surgical history.  There were no vitals filed for this visit.   Subjective Assessment - 12/30/20 0934    Subjective  Pt denies any pain.    Patient is accompanied by: Family member   caregiver   Pertinent History CVA R basal ganglia, HTN, HLD, hypothyroidism, asthma, glaucoma    Currently in Pain? No/denies                        OT Treatments/Exercises (OP) - 12/30/20 1001      ADLs   Overall ADLs checked goals for renewal    Home Maintenance folding clothes - Pt with good BUE use for folding clothes with minimal compensatory methods with LUE shoulder hiking and lateral flexion.     Neurological Re-education Exercises   Other Exercises 1 table slides x 10 - Table slides for increase in shoulder flexion range of motion in gravity eliminated position.    Other Grasp and Release Exercises  LUE with low level reaching for colored cylindrical pegs - Pegs with mod tactile  and verbal cues for minimizing compensatory movements with LUE.                          OT Short Term Goals - 12/30/20 0933      OT SHORT TERM GOAL #1   Title Pt will be independent with HEP (12/07/20)    Time 4    Period Weeks    Status Achieved    Target Date 12/07/20      OT SHORT TERM GOAL #2   Title Pt will improve grip strength in LUE by 3 lbs or greater for increase in functional use of LUE and increase independence with clothing management.    Baseline RUE 42.1 LUE 26.8    Time 4    Period Weeks    Status Achieved   37.9 LUE     OT SHORT TERM GOAL #3   Title Pt will obtain item from mid level shelf with increase in shoulder flexion to 70 degrees with good positioning and reduction in shoulder hiking and compensatory movements.    Baseline LUE 66 degrees sh flexion    Time 4    Period Weeks  Status Achieved   90* on 12/23/2020     OT SHORT TERM GOAL #4   Title Pt will demonstrate improved box and blocks score to 35 or greater with LUE for increase in functional use of LUE.    Baseline 29 LUE    Time 4    Period Weeks    Status On-going   12/30/20 - 33 blocks with significant shoulder hiking on LUE     OT SHORT TERM GOAL #5   Title Pt will verbalize understanding of sleep positions for LUE to decrease pain.    Baseline 6/10 LUE shoulder pain at night    Time 4    Period Weeks    Status Achieved      OT SHORT TERM GOAL #6   Title Pt will don bra with mod I    Baseline daughter currently assisting    Time 4    Period Weeks    Status Achieved   12/07/20 - pt reports donning bra all clothing            OT Long Term Goals - 12/30/20 0940      OT LONG TERM GOAL #1   Title Pt will be independent with updated HEP 01/04/2021    Time 8    Period Weeks    Status On-going   pt completing shoulder HEPs - ongoing to update PRN     OT LONG TERM GOAL #2   Title Pt will demonstrate increased grip strength in LUE to 33 lbs or greater for improved functional  use. UPDATE: 37 lbs or greater    Baseline RUE 42.1 LUE 26.8    Time 8    Period Weeks    Status Revised   33.7 lbs on 12/30/2020 - upgraded goal for continuing to work on increasing grip strength     [place] #3   Title Pt will perform UB and LB dressing with mod I.    Time 8    Period Weeks    Status Achieved   pt and caregiver report pt is doing all dressing except shower days     OT LONG TERM GOAL #4   Title Pt will increase fine motor coordination by completing 9 hole peg test in 50 seconds or less with LUE. Upgraded 12/23/2020: 45seconds or less with LUE UPDATE: 38s or less with L elbow on table    Baseline RUE 27.47 LUE 61.87    Time 8    Period Weeks    Status Revised   12/23/2020: 50.19s ; 12/30/20 41.62s w elbow on table. upgraded to continue to target fine motor coordination     OT LONG TERM GOAL #5   Title Pt will improve shoulder flexion to at least 100 degrees in order to obtain object and increase functional reach.    Baseline LUE 66 degrees sh flexion    Time 8    Period Weeks    Status On-going   17* on 12/30/2020 - continue working on LUE shoulder flexion and reach     OT LONG TERM GOAL #6   Title Pt will report decrease in pain in LUE shoulder at night to less than or equal to 4/10 with improved sleep positioning and range of motion    Time 8    Period Weeks    Status Achieved                 Plan - 12/30/20 0953    Clinical  Impression Statement Pt continues to progress towards goals. Pt has met 5/6 STGs and 4/6 LTGs and continuing to progress towards remaining goals. Renewal completed this day for continuing skilled occupational therapy for patient to target LUE NMR, coordination and grip strength and increasing participation in IADLs and ADLs. OT has upgraded goals for continuing to progress and will continue working towards unmet goals.    OT Occupational Profile and History Problem Focused Assessment - Including review of records relating to  presenting problem    Occupational performance deficits (Please refer to evaluation for details): ADL's;IADL's    Body Structure / Function / Physical Skills ADL;Balance;Coordination;IADL;Pain;GMC;Hearing;Strength;Tone;Sensation;Dexterity;Decreased knowledge of use of DME;FMC;UE functional use;ROM;Flexibility    Cognitive Skills Attention;Sequencing;Problem Solve    OT Frequency 2x / week    OT Duration 4 weeks   renewal completed for 2x/week for 4 weeks   OT Treatment/Interventions Self-care/ADL training;Cryotherapy;Ultrasound;Moist Heat;Electrical Stimulation;Paraffin;Energy conservation;DME and/or AE instruction;Manual Therapy;Functional Mobility Training;Neuromuscular education;Therapeutic exercise;Visual/perceptual remediation/compensation;Patient/family education;Balance training;Therapeutic activities;Passive range of motion;Cognitive remediation/compensation    Plan renewal completed 2/25, continue working towards goals    Consulted and Agree with Plan of Care Family member/caregiver;Patient    Family Member Consulted caregiever Pam           Patient will benefit from skilled therapeutic intervention in order to improve the following deficits and impairments:   Body Structure / Function / Physical Skills: ADL,Balance,Coordination,IADL,Pain,GMC,Hearing,Strength,Tone,Sensation,Dexterity,Decreased knowledge of use of DME,FMC,UE functional use,ROM,Flexibility Cognitive Skills: Attention,Sequencing,Problem Solve     Visit Diagnosis: Muscle weakness (generalized) - Plan: Ot plan of care cert/re-cert  Other lack of coordination - Plan: Ot plan of care cert/re-cert  Other abnormalities of gait and mobility - Plan: Ot plan of care cert/re-cert  Unsteadiness on feet - Plan: Ot plan of care cert/re-cert  Hemiplegia and hemiparesis following cerebral infarction affecting left non-dominant side ([place]) - Plan: Ot plan of care cert/re-cert  Stiffness of left shoulder, not elsewhere classified  - Plan: Ot plan of care cert/re-cert  Acute pain of left shoulder - Plan: Ot plan of care cert/re-cert    Problem List Patient Active Problem List   Diagnosis Date Noted  . Spasticity 11/30/2020  . Wheelchair dependence 10/07/2020  . Left knee pain 07/26/2020  . Cerebrovascular accident (CVA) of right basal ganglia ([place]) 07/26/2020  . Left hemiparesis ([place]) 07/25/2020  . Hypokalemia 07/20/2020  . Right basal ganglia embolic stroke ([place]) 01/74/9449  . Essential hypertension 07/20/2020  . Hypothyroidism 07/20/2020    Zachery Conch MOT, OTR/L  12/30/2020, 10:21 AM  [facility] [address] [place], [place], [address] Phone: [phone]   Fax:  [phone]  Name: Nicole [name] MRN: [id number] Date of Birth: Dec 27, 1929

## 2021-01-03 ENCOUNTER — Ambulatory Visit: Payer: Medicare Other | Attending: Physical Medicine and Rehabilitation

## 2021-01-03 ENCOUNTER — Other Ambulatory Visit: Payer: Self-pay

## 2021-01-03 ENCOUNTER — Ambulatory Visit: Payer: Medicare Other | Admitting: Occupational Therapy

## 2021-01-03 DIAGNOSIS — R2689 Other abnormalities of gait and mobility: Secondary | ICD-10-CM | POA: Diagnosis not present

## 2021-01-03 DIAGNOSIS — R262 Difficulty in walking, not elsewhere classified: Secondary | ICD-10-CM | POA: Diagnosis not present

## 2021-01-03 DIAGNOSIS — R278 Other lack of coordination: Secondary | ICD-10-CM

## 2021-01-03 DIAGNOSIS — I69354 Hemiplegia and hemiparesis following cerebral infarction affecting left non-dominant side: Secondary | ICD-10-CM | POA: Diagnosis not present

## 2021-01-03 DIAGNOSIS — M25612 Stiffness of left shoulder, not elsewhere classified: Secondary | ICD-10-CM | POA: Insufficient documentation

## 2021-01-03 DIAGNOSIS — M25512 Pain in left shoulder: Secondary | ICD-10-CM | POA: Insufficient documentation

## 2021-01-03 DIAGNOSIS — R2681 Unsteadiness on feet: Secondary | ICD-10-CM | POA: Insufficient documentation

## 2021-01-03 DIAGNOSIS — M6281 Muscle weakness (generalized): Secondary | ICD-10-CM | POA: Diagnosis not present

## 2021-01-03 NOTE — Therapy (Signed)
[facility] [address] [place], [place], [address] Phone: [phone]   Fax:  [phone]  Occupational Therapy Treatment  Patient Details  Name: Nicole [name] MRN: [id number] Date of Birth: December 08, 1929 Referring Provider (OT): Genice Rouge, MD   Encounter Date: 01/03/2021   OT End of Session - 01/03/21 1034    Visit Number 16    Number of Visits 23    Date for OT Re-Evaluation 01/27/21    Authorization Type [employer] Medicare - WEEK 1/4 (as of 01/03/21)    Authorization Time Period $40 Copay VL:MN No Auth required  10th visit PN     Authorization - Visit Number 16    Authorization - Number of Visits 23    Progress Note Due on Visit 25    OT Start Time 1020    OT Stop Time 1100    OT Time Calculation (min) 40 min    Activity Tolerance Patient tolerated treatment well    Behavior During Therapy [facility] for tasks assessed/performed           Past Medical History:  Diagnosis Date  . Hypertension   . Stroke [facility])    September 2021  . Thyroid disease     No past surgical history on file.  There were no vitals filed for this visit.   Subjective Assessment - 01/03/21 1022    Patient is accompanied by: Family member   caregiver   Pertinent History CVA R basal ganglia, HTN, HLD, hypothyroidism, asthma, glaucoma    Currently in Pain? No/denies                        OT Treatments/Exercises (OP) - 01/03/21 0001      Hand Exercises   Other Hand Exercises Gripper set at level 1 for control, grip strength, and coordinated movement LUE/hand to pick up blocks - pt required 2 rest breaks and modifications (lower height bowl) for last few blocks      Neurological Re-education Exercises   Other Exercises 1 LUE AA/ROM along diagonal surface for sh flexion palm down and then neutral forearm      Functional Reaching Activities   Mid Level Functional mid level reaching to place shower rings on pronged ladder (at  level 3) and removing x 2 times. BUE mid level reaching to climb dowel up ladder levels1- 3 and back down x 5 reps      Fine Motor Coordination (Hand/Wrist)   Fine Motor Coordination --   stringing plastic pegs for coordination and bilateral integration using Lt hand as dominant hand for task and following pattern for cognitive component (100% accurate)                   OT Short Term Goals - 12/30/20 0933      OT SHORT TERM GOAL #1   Title Pt will be independent with HEP (12/07/20)    Time 4    Period Weeks    Status Achieved    Target Date 12/07/20      OT SHORT TERM GOAL #2   Title Pt will improve grip strength in LUE by 3 lbs or greater for increase in functional use of LUE and increase independence with clothing management.    Baseline RUE 42.1 LUE 26.8    Time 4    Period Weeks    Status Achieved   37.9 LUE     OT SHORT TERM GOAL #3  Title Pt will obtain item from mid level shelf with increase in shoulder flexion to 70 degrees with good positioning and reduction in shoulder hiking and compensatory movements.    Baseline LUE 66 degrees sh flexion    Time 4    Period Weeks    Status Achieved   90* on 12/23/2020     OT SHORT TERM GOAL #4   Title Pt will demonstrate improved box and blocks score to 35 or greater with LUE for increase in functional use of LUE.    Baseline 29 LUE    Time 4    Period Weeks    Status On-going   12/30/20 - 33 blocks with significant shoulder hiking on LUE     OT SHORT TERM GOAL #5   Title Pt will verbalize understanding of sleep positions for LUE to decrease pain.    Baseline 6/10 LUE shoulder pain at night    Time 4    Period Weeks    Status Achieved      OT SHORT TERM GOAL #6   Title Pt will don bra with mod I    Baseline daughter currently assisting    Time 4    Period Weeks    Status Achieved   12/07/20 - pt reports donning bra all clothing            OT Long Term Goals - 12/30/20 0940      OT LONG TERM GOAL #1   Title  Pt will be independent with updated HEP 01/04/2021    Time 8    Period Weeks    Status On-going   pt completing shoulder HEPs - ongoing to update PRN     OT LONG TERM GOAL #2   Title Pt will demonstrate increased grip strength in LUE to 33 lbs or greater for improved functional use. UPDATE: 37 lbs or greater    Baseline RUE 42.1 LUE 26.8    Time 8    Period Weeks    Status Revised   33.7 lbs on 12/30/2020 - upgraded goal for continuing to work on increasing grip strength     OT LONG TERM GOAL #3   Title Pt will perform UB and LB dressing with mod I.    Time 8    Period Weeks    Status Achieved   pt and caregiver report pt is doing all dressing except shower days     OT LONG TERM GOAL #4   Title Pt will increase fine motor coordination by completing 9 hole peg test in 50 seconds or less with LUE. Upgraded 12/23/2020: 45seconds or less with LUE UPDATE: 38s or less with L elbow on table    Baseline RUE 27.47 LUE 61.87    Time 8    Period Weeks    Status Revised   12/23/2020: 50.19s ; 12/30/20 41.62s w elbow on table. upgraded to continue to target fine motor coordination     OT LONG TERM GOAL #5   Title Pt will improve shoulder flexion to at least 100 degrees in order to obtain object and increase functional reach.    Baseline LUE 66 degrees sh flexion    Time 8    Period Weeks    Status On-going   90* on 12/30/2020 - continue working on LUE shoulder flexion and reach     OT LONG TERM GOAL #6   Title Pt will report decrease in pain in LUE shoulder at night to less than or  equal to 4/10 with improved sleep positioning and range of motion    Time 8    Period Weeks    Status Achieved                 Plan - 01/03/21 1149    Clinical Impression Statement Pt progressing with LUE use but requires cues to continue to use/carryover at home    OT Occupational Profile and History Problem Focused Assessment - Including review of records relating to presenting problem    Occupational  performance deficits (Please refer to evaluation for details): ADL's;IADL's    Body Structure / Function / Physical Skills ADL;Balance;Coordination;IADL;Pain;GMC;Hearing;Strength;Tone;Sensation;Dexterity;Decreased knowledge of use of DME;FMC;UE functional use;ROM;Flexibility    Cognitive Skills Attention;Sequencing;Problem Solve    OT Frequency 2x / week    OT Duration 4 weeks   renewal completed for 2x/week for 4 weeks   OT Treatment/Interventions Self-care/ADL training;Cryotherapy;Ultrasound;Moist Heat;Electrical Stimulation;Paraffin;Energy conservation;DME and/or AE instruction;Manual Therapy;Functional Mobility Training;Neuromuscular education;Therapeutic exercise;Visual/perceptual remediation/compensation;Patient/family education;Balance training;Therapeutic activities;Passive range of motion;Cognitive remediation/compensation    Plan renewal completed 2/25, continue working towards goals, work on shoulder supine and AA/ROM in higher ranges    Consulted and Agree with Plan of Care Family member/caregiver;Patient    Family Member Consulted caregiever Pam           Patient will benefit from skilled therapeutic intervention in order to improve the following deficits and impairments:   Body Structure / Function / Physical Skills: ADL,Balance,Coordination,IADL,Pain,GMC,Hearing,Strength,Tone,Sensation,Dexterity,Decreased knowledge of use of DME,FMC,UE functional use,ROM,Flexibility Cognitive Skills: Attention,Sequencing,Problem Solve     Visit Diagnosis: Hemiplegia and hemiparesis following cerebral infarction affecting left non-dominant side ([facility])  Other lack of coordination  Muscle weakness (generalized)    Problem List Patient Active Problem List   Diagnosis Date Noted  . Spasticity 11/30/2020  . Wheelchair dependence 10/07/2020  . Left knee pain 07/26/2020  . Cerebrovascular accident (CVA) of right basal ganglia ([facility]) 07/26/2020  . Left hemiparesis ([facility]) 07/25/2020  .  Hypokalemia 07/20/2020  . Right basal ganglia embolic stroke ([facility]) 07/20/2020  . Essential hypertension 07/20/2020  . Hypothyroidism 07/20/2020    Kelli Churn, OTR/L 01/03/2021, 11:51 AM  [facility] [address] [place], [place], [address] Phone: [phone]   Fax:  [phone]  Name: Nicole [name] MRN: [id number] Date of Birth: 06-28-1930

## 2021-01-03 NOTE — Therapy (Signed)
[facility] [address] [place], [place], [address] Phone: [phone]   Fax:  [phone]  Physical Therapy Treatment  Patient Details  Name: Nicole [name] MRN: [id number] Date of Birth: 04/02/1930 Referring Provider (PT): Genice Rouge, MD   Encounter Date: 01/03/2021   PT End of Session - 01/03/21 1103    Visit Number 20    Number of Visits 25    Date for PT Re-Evaluation 02/10/21   POC for 6 weeks, Cert for 60 days   Authorization Type BCBS Medicare    Progress Note Due on Visit 29   (Progress Note completed on 19th visit)   PT Start Time 1102    PT Stop Time 1144    PT Time Calculation (min) 42 min    Equipment Utilized During Treatment Gait belt    Activity Tolerance Patient tolerated treatment well    Behavior During Therapy WFL for tasks assessed/performed           Past Medical History:  Diagnosis Date  . Hypertension   . Stroke [facility])    September 2021  . Thyroid disease     History reviewed. No pertinent surgical history.  There were no vitals filed for this visit.   Subjective Assessment - 01/03/21 1103    Subjective Patient reports that she feels like she is standing a little bit longer. No new changes. Continue to report no skin issues with brace.    Patient is accompained by: Family member   CNA, Pam   Pertinent History R Basal Ganglia CVA, Hyperlipidemia, Hypothyroidism, Asthma, Glaucoma, HTN    Limitations Standing;Walking    Patient Stated Goals Be able to walk;    Currently in Pain? No/denies              [facility] Adult PT Treatment/Exercise - 01/03/21 1115      Transfers   Transfers Sit to Stand;Stand to [employer] Transfers    Sit to Stand 4: Min guard;With upper extremity assist    Stand to Sit 4: Min guard;With upper extremity assist;To bed;To chair/3-in-1    Stand Pivot Transfers 5: Supervision    Stand Pivot Transfer Details (indicate cue type and reason) transfer from  w/c <> mat with supervision and use of RW. patient demonstrating turn and proper management of AD well.    Comments compleleted sit <> stand training from mat with PT in front, completed x 5 reps. worked on pushing up from [employer] and working to maintain balance in standing. PT placing leg between patients to avoid adduction and promote improved alignment.      Ambulation/Gait   Ambulation/Gait Yes    Ambulation/Gait Assistance 4: Min guard    Ambulation/Gait Assistance Details completed ambulation with RW and AFO donned x 230 ft, increased difficulty standing tall and putting weight through LLE initially but demo improvements iwth increased distance.c ontinue to provide CGA.    Ambulation Distance (Feet) 230 Feet    Assistive device Rolling walker    Gait Pattern Step-to pattern;Step-through pattern;Decreased step length - right;Decreased step length - left;Decreased stance time - left;Decreased hip/knee flexion - left;Decreased stride length;Decreased weight shift to left;Left flexed knee in stance;Antalgic;Trunk flexed;Narrow base of support    Ambulation Surface Level;Indoor    Stairs Yes    Stairs Assistance 4: Min guard;4: Min assist    Stairs Assistance Details (indicate cue type and reason) continued stair trianing, patient CGA with ascending, but continues to require Min A at times with  descent due to balance challenge. one verbal cues required for hand placement but demo carryover throughout after cue provided.    Stair Management Technique Two rails;Step to pattern;Forwards    Number of Stairs 4    Height of Stairs 6      Neuro Re-ed    Neuro Re-ed Details  standing at RW with 4" step completed alternating toe taps B x 10 reps with BUE support. PRogressed to completing toe taps on LLE x 10 reps with no UE support and CGA. One instance of foot catching requiring Min A to maintain balance and verbal cues for sequencing. With UE support from chair completed steps out to the side completed 2 x  10 with LLE, follwoed by 2 x10 reps on RLE to promote weight shift. verbal cues to reduce support provided through UE. Increased challenge with LLE adduction.               PT Short Term Goals - 12/30/20 1022      PT SHORT TERM GOAL #1   Title Patient/family member will report complteing household ambulation with RW at least 2x/day (All STGs Due: 01/02/21)    Baseline currently ambulating multiple times daily per patient reports, daughter not present today    Time 3    Period Weeks    Status Achieved    Target Date 01/02/21      PT SHORT TERM GOAL #2   Title Patient will improve 5x sit <> stand to </= 30 seconds with UE support to demonstrate improved balance    Baseline 35.97 secs; 28.56 secs with UE support    Time 3    Period Weeks    Status Achieved      PT SHORT TERM GOAL #3   Title Patient will improve TUG to </= to 68 seconds to demonstrate improved balance and reduced fall risk    Baseline 73.41 secs; 53.21 secs    Time 3    Period Weeks    Status Achieved             PT Long Term Goals - 12/09/20 2016      PT LONG TERM GOAL #1   Title Patient will be independent with final HEP with caregiver assistance, and report compliance with daily walking within the home with assistance (ALL LTGs Due: 01/23/21)    Baseline 12/09/20: current program was reviewed and updated this session, will benefit from further program advancement as pt progresses    Time 6    Period Weeks    Status Revised    Target Date 01/23/21      PT LONG TERM GOAL #2   Title Patient will be able to ambulate >/= 200 ft on indoor level surfaces with CGA and LRAD to demonstrate improved household mobility    Baseline 12/07/20: 115' with min guard/ocassional min A  with RW    Time 6    Period Weeks    Status Revised      PT LONG TERM GOAL #3   Title Patient will completed 5x sit <> stand in </= 25 seconds to demonstrate reduced fall risk and improved balance    Baseline 35.97 secs    Time 6     Period Weeks    Status Revised      PT LONG TERM GOAL #4   Title Patient will improve TUG to </= 60 seconds to demonstrate reduced fall risk and improved balance    Baseline T2/4/22: 73.41 sec's with  RW/brace on left LE    Time 6    Period Weeks    Status Revised      PT LONG TERM GOAL #5   Title Patient will improve gait speed to >/= 0.80 ft/sec to demonstrate improved tolerance for household mobility    Baseline 0.58 ft/sec    Time 6    Period Weeks    Status New                 Plan - 01/03/21 1105    Clinical Impression Statement Today's skilled PT session included continued gait and stair training with AFO, patient continue to require increased assistance and cues with stair negotiation at this time. Will continue to progress toward all LTGs    Personal Factors and Comorbidities Comorbidity 3+;Time since onset of injury/illness/exacerbation;Age    Comorbidities R Basal Ganglia CVA, Hyperlipidemia, Hypothyroidism, Asthma, Glaucoma, HTN    Examination-Activity Limitations Bed Mobility;Dressing;Locomotion Level;Reach Overhead;Stairs;Stand;Transfers    Examination-Participation Restrictions Psychiatric nurse Evolving/Moderate complexity    Rehab Potential Fair    PT Frequency 2x / week    PT Duration 6 weeks    PT Treatment/Interventions ADLs/Self Care Home Management;Cryotherapy;Electrical Stimulation;Moist Heat;DME Instruction;Gait training;Stair training;Functional mobility training;Therapeutic activities;Therapeutic exercise;Balance training;Neuromuscular re-education;Patient/family education;Orthotic Fit/Training;Manual techniques;Passive range of motion    PT Next Visit Plan Completed progress note on 19th visit. Talk to daughter about getting toe cap from hanger for brace due to carpet in the house. Continue to work on LE strengthening. stair training. continue with use of anterior [place] brace. Continue to work on  standing posture and weight shifitng, balance on compliant surfaces.    PT Home Exercise Plan Access Code: [email or web address]    Consulted and Agree with Plan of Care Patient           Patient will benefit from skilled therapeutic intervention in order to improve the following deficits and impairments:  Abnormal gait,Decreased balance,Decreased mobility,Decreased endurance,Difficulty walking,Impaired tone,Impaired sensation,Pain,Decreased strength,Decreased safety awareness,Decreased knowledge of use of DME,Decreased coordination,Decreased activity tolerance,Decreased range of motion  Visit Diagnosis: Muscle weakness (generalized)  Other abnormalities of gait and mobility  Unsteadiness on feet  Difficulty in walking, not elsewhere classified     Problem List Patient Active Problem List   Diagnosis Date Noted  . Spasticity 11/30/2020  . Wheelchair dependence 10/07/2020  . Left knee pain 07/26/2020  . Cerebrovascular accident (CVA) of right basal ganglia (HCC) 07/26/2020  . Left hemiparesis (HCC) 07/25/2020  . Hypokalemia 07/20/2020  . Right basal ganglia embolic stroke (HCC) 07/20/2020  . Essential hypertension 07/20/2020  . Hypothyroidism 07/20/2020    Tempie Donning, PT, DPT 01/03/2021, 12:21 PM  [facility] [address] [place], [place], [address] Phone: [phone]   Fax:  [phone]  Name: Nicole [name] MRN: [id number] Date of Birth: Aug 17, 1930

## 2021-01-06 ENCOUNTER — Encounter: Payer: Self-pay | Admitting: Occupational Therapy

## 2021-01-06 ENCOUNTER — Ambulatory Visit: Payer: Medicare Other | Admitting: Occupational Therapy

## 2021-01-06 ENCOUNTER — Ambulatory Visit: Payer: Medicare Other

## 2021-01-06 ENCOUNTER — Other Ambulatory Visit: Payer: Self-pay

## 2021-01-06 DIAGNOSIS — I69354 Hemiplegia and hemiparesis following cerebral infarction affecting left non-dominant side: Secondary | ICD-10-CM

## 2021-01-06 DIAGNOSIS — M6281 Muscle weakness (generalized): Secondary | ICD-10-CM

## 2021-01-06 DIAGNOSIS — R262 Difficulty in walking, not elsewhere classified: Secondary | ICD-10-CM

## 2021-01-06 DIAGNOSIS — M25612 Stiffness of left shoulder, not elsewhere classified: Secondary | ICD-10-CM

## 2021-01-06 DIAGNOSIS — R2689 Other abnormalities of gait and mobility: Secondary | ICD-10-CM | POA: Diagnosis not present

## 2021-01-06 DIAGNOSIS — M25512 Pain in left shoulder: Secondary | ICD-10-CM | POA: Diagnosis not present

## 2021-01-06 DIAGNOSIS — R2681 Unsteadiness on feet: Secondary | ICD-10-CM

## 2021-01-06 DIAGNOSIS — M21372 Foot drop, left foot: Secondary | ICD-10-CM | POA: Diagnosis not present

## 2021-01-06 DIAGNOSIS — R278 Other lack of coordination: Secondary | ICD-10-CM

## 2021-01-06 NOTE — Therapy (Signed)
[facility] [address] [place], [place], [address] Phone: [phone]   Fax:  [phone]  Occupational Therapy Treatment  Patient Details  Name: Nicole [name] MRN: [id number] Date of Birth: 10-15-1930 Referring Provider (OT): Genice Rouge, MD   Encounter Date: 01/06/2021   OT End of Session - 01/06/21 0932    Visit Number 17    Number of Visits 23    Date for OT Re-Evaluation 01/27/21    Authorization Type BCBS Medicare - WEEK 1/4 (as of 01/03/21)    Authorization Time Period $40 Copay VL:MN No Auth required  10th visit PN     Authorization - Visit Number 17    Authorization - Number of Visits 23    Progress Note Due on Visit 25    OT Start Time 0930    OT Stop Time 1011    OT Time Calculation (min) 41 min    Activity Tolerance Patient tolerated treatment well    Behavior During Therapy [facility] for tasks assessed/performed           Past Medical History:  Diagnosis Date  . Hypertension   . Stroke [facility])    September 2021  . Thyroid disease     History reviewed. No pertinent surgical history.  There were no vitals filed for this visit.   Subjective Assessment - 01/06/21 0933    Subjective  Pt denies any pain today.    Patient is accompanied by: Family member    Pertinent History CVA R basal ganglia, HTN, HLD, hypothyroidism, asthma, glaucoma    Currently in Pain? No/denies                        OT Treatments/Exercises (OP) - 01/06/21 0941      Neurological Re-education Exercises   Other Exercises 1 self PROM seated to floor, big movements with BUE horiz abd, russian twist, forward reaching on physioball, rolling stool, shoulder flexion with unweighted cane and with foam roll with mod facilitation for decreasing compensatory movements and tensing of shoulders. Pt with improved LUE functional use and movements this day but continues to present with guarding and compensatory movements d/t  weakness and poor movement patterns.    Other Exercises 2 low level reaching with LUE with trunk rotation to left to otain 1 inch block with LUE and place in bowl on chair. Min verbal and tactile cues for movement patterns with LUE shoulder      Modalities   Modalities Moist Heat   with supine prolonged stretch of neck extension and traps and pecs     Moist Heat Therapy   Number Minutes Moist Heat 10 Minutes   unbillable   Moist Heat Location Shoulder;Cervical   BUE                   OT Short Term Goals - 12/30/20 0933      OT SHORT TERM GOAL #1   Title Pt will be independent with HEP (12/07/20)    Time 4    Period Weeks    Status Achieved    Target Date 12/07/20      OT SHORT TERM GOAL #2   Title Pt will improve grip strength in LUE by 3 lbs or greater for increase in functional use of LUE and increase independence with clothing management.    Baseline RUE 42.1 LUE 26.8    Time 4    Period Weeks  Status Achieved   37.9 LUE     OT SHORT TERM GOAL #3   Title Pt will obtain item from mid level shelf with increase in shoulder flexion to 70 degrees with good positioning and reduction in shoulder hiking and compensatory movements.    Baseline LUE 66 degrees sh flexion    Time 4    Period Weeks    Status Achieved   90* on 12/23/2020     OT SHORT TERM GOAL #4   Title Pt will demonstrate improved box and blocks score to 35 or greater with LUE for increase in functional use of LUE.    Baseline 29 LUE    Time 4    Period Weeks    Status On-going   12/30/20 - 33 blocks with significant shoulder hiking on LUE     OT SHORT TERM GOAL #5   Title Pt will verbalize understanding of sleep positions for LUE to decrease pain.    Baseline 6/10 LUE shoulder pain at night    Time 4    Period Weeks    Status Achieved      OT SHORT TERM GOAL #6   Title Pt will don bra with mod I    Baseline daughter currently assisting    Time 4    Period Weeks    Status Achieved   12/07/20 - pt  reports donning bra all clothing            OT Long Term Goals - 12/30/20 0940      OT LONG TERM GOAL #1   Title Pt will be independent with updated HEP 01/04/2021    Time 8    Period Weeks    Status On-going   pt completing shoulder HEPs - ongoing to update PRN     OT LONG TERM GOAL #2   Title Pt will demonstrate increased grip strength in LUE to 33 lbs or greater for improved functional use. UPDATE: 37 lbs or greater    Baseline RUE 42.1 LUE 26.8    Time 8    Period Weeks    Status Revised   33.7 lbs on 12/30/2020 - upgraded goal for continuing to work on increasing grip strength     OT LONG TERM GOAL #3   Title Pt will perform UB and LB dressing with mod I.    Time 8    Period Weeks    Status Achieved   pt and caregiver report pt is doing all dressing except shower days     OT LONG TERM GOAL #4   Title Pt will increase fine motor coordination by completing 9 hole peg test in 50 seconds or less with LUE. Upgraded 12/23/2020: 45seconds or less with LUE UPDATE: 38s or less with L elbow on table    Baseline RUE 27.47 LUE 61.87    Time 8    Period Weeks    Status Revised   12/23/2020: 50.19s ; 12/30/20 41.62s w elbow on table. upgraded to continue to target fine motor coordination     OT LONG TERM GOAL #5   Title Pt will improve shoulder flexion to at least 100 degrees in order to obtain object and increase functional reach.    Baseline LUE 66 degrees sh flexion    Time 8    Period Weeks    Status On-going   90* on 12/30/2020 - continue working on LUE shoulder flexion and reach     OT LONG TERM GOAL #6  Title Pt will report decrease in pain in LUE shoulder at night to less than or equal to 4/10 with improved sleep positioning and range of motion    Time 8    Period Weeks    Status Achieved                 Plan - 01/06/21 1004    Clinical Impression Statement Pt progressing with LUE use but requires cues to continue to use/carryover at home    OT Occupational Profile  and History Problem Focused Assessment - Including review of records relating to presenting problem    Occupational performance deficits (Please refer to evaluation for details): ADL's;IADL's    Body Structure / Function / Physical Skills ADL;Balance;Coordination;IADL;Pain;GMC;Hearing;Strength;Tone;Sensation;Dexterity;Decreased knowledge of use of DME;FMC;UE functional use;ROM;Flexibility    Cognitive Skills Attention;Sequencing;Problem Solve    OT Frequency 2x / week    OT Duration 4 weeks   renewal completed for 2x/week for 4 weeks   OT Treatment/Interventions Self-care/ADL training;Cryotherapy;Ultrasound;Moist Heat;Electrical Stimulation;Paraffin;Energy conservation;DME and/or AE instruction;Manual Therapy;Functional Mobility Training;Neuromuscular education;Therapeutic exercise;Visual/perceptual remediation/compensation;Patient/family education;Balance training;Therapeutic activities;Passive range of motion;Cognitive remediation/compensation    Plan work on using LUE with more funtional tasks    Consulted and Agree with Plan of Care Family member/caregiver;Patient    Family Member Consulted caregiever Pam           Patient will benefit from skilled therapeutic intervention in order to improve the following deficits and impairments:   Body Structure / Function / Physical Skills: ADL,Balance,Coordination,IADL,Pain,GMC,Hearing,Strength,Tone,Sensation,Dexterity,Decreased knowledge of use of DME,FMC,UE functional use,ROM,Flexibility Cognitive Skills: Attention,Sequencing,Problem Solve     Visit Diagnosis: Hemiplegia and hemiparesis following cerebral infarction affecting left non-dominant side (HCC)  Stiffness of left shoulder, not elsewhere classified  Other lack of coordination  Acute pain of left shoulder  Muscle weakness (generalized)    Problem List Patient Active Problem List   Diagnosis Date Noted  . Spasticity 11/30/2020  . Wheelchair dependence 10/07/2020  . Left knee  pain 07/26/2020  . Cerebrovascular accident (CVA) of right basal ganglia (HCC) 07/26/2020  . Left hemiparesis (HCC) 07/25/2020  . Hypokalemia 07/20/2020  . Right basal ganglia embolic stroke (HCC) 07/20/2020  . Essential hypertension 07/20/2020  . Hypothyroidism 07/20/2020    Junious Dresser MOT, OTR/L  01/06/2021, 10:23 AM  [facility] [address] [place], [place], [address] Phone: [phone]   Fax:  [phone]  Name: AQUARIUS TREMPER MRN: [id number] Date of Birth: [birth year]

## 2021-01-06 NOTE — Patient Instructions (Signed)
Nicole [name] will benefit from a toe cap added to the Left Shoe, especially for indoor carpeted surfaces as you all have in the home.    If you will reach out to the [facility]) they will get you scheduled to drop the shoe off and get it placed on the shoe. Below is the contact information for [facility]:  [address] [place], [place] [address] Phone: [phone] Fax: [phone]  HOURS Monday - Friday, 8 a.m. - 5 p.m.  Thank you,  Adelfa Koh, PT, DPT   [facility] [address] [place], [place]  [address] Phone:  [phone] Fax:  [phone]

## 2021-01-06 NOTE — Therapy (Signed)
[facility] [address] [place], [place], [address] Phone: [phone]   Fax:  [phone]  Physical Therapy Treatment  Patient Details  Name: Nicole [name] MRN: [id number] Date of Birth: 29-Apr-1930 Referring Provider (PT): Genice Rouge, MD   Encounter Date: 01/06/2021   PT End of Session - 01/06/21 1022    Visit Number 21    Number of Visits 25    Date for PT Re-Evaluation 02/10/21   POC for 6 weeks, Cert for 60 days   Authorization Type BCBS Medicare    Progress Note Due on Visit 29   (Progress Note completed on 19th visit)   PT Start Time 1015    PT Stop Time 1059    PT Time Calculation (min) 44 min    Equipment Utilized During Treatment Gait belt    Activity Tolerance Patient tolerated treatment well    Behavior During Therapy WFL for tasks assessed/performed           Past Medical History:  Diagnosis Date  . Hypertension   . Stroke [facility])    September 2021  . Thyroid disease     History reviewed. No pertinent surgical history.  There were no vitals filed for this visit.   Subjective Assessment - 01/06/21 1016    Subjective No new changes/complaints. No pain to report. No falls.    Patient is accompained by: Family member   CNA, Pam   Pertinent History R Basal Ganglia CVA, Hyperlipidemia, Hypothyroidism, Asthma, Glaucoma, HTN    Limitations Standing;Walking    Patient Stated Goals Be able to walk;    Currently in Pain? No/denies                [facility] Adult PT Treatment/Exercise - 01/06/21 1031      Transfers   Transfers Sit to Stand;Stand to Sit    Sit to Stand 4: Min guard;With upper extremity assist    Stand to Sit 4: Min guard;With upper extremity assist;To bed;To chair/3-in-1    Stand Pivot Transfers 5: Supervision    Stand Pivot Transfer Details (indicate cue type and reason) patient completing transfer from mat <> w/c at end of session with supervision.      Ambulation/Gait    Ambulation/Gait Yes    Ambulation/Gait Assistance 4: Min guard;5: Supervision    Ambulation/Gait Assistance Details completed ambulation around therapy gym x 230 ft with RW. Increased challenge initially requiring CGA, but progressing to supervision intermittently with increased ambulation distance. Completed additional ambulation x 115 ft at end of session, improved carryover in step length noted    Ambulation Distance (Feet) 230 Feet   x 1, 115 x 1   Assistive device Rolling walker    Gait Pattern Step-to pattern;Step-through pattern;Decreased step length - right;Decreased step length - left;Decreased stance time - left;Decreased hip/knee flexion - left;Decreased stride length;Decreased weight shift to left;Left flexed knee in stance;Antalgic;Trunk flexed;Narrow base of support    Ambulation Surface Level;Indoor      Self-Care   Self-Care Other Self-Care Comments    Other Self-Care Comments  PT educating on need for toe cap added. Handout sent home as message for daughter/family member. PT educated daughter when she arrived at end of session      Neuro Re-ed    Neuro Re-ed Details  with patient supine: completed PNF D1 pattern with LLE passively initially x 10 reps, then progressed to PT providing manual resistance to patient's tolerance x 10 reps. Completed x 2 sets.  Exercises   Exercises Knee/Hip      Knee/Hip Exercises: Supine   Heel Slides Strengthening;AROM;Left;2 sets;10 reps    Heel Slides Limitations added in yellow theraband for increased resistance to promote strengthening, 2 x10 reps    Bridges Strengthening;Both;2 sets;10 reps    Bridges Limitations cues for technqiue, faciliation for equal use of BLE.              PT Education - 01/06/21 1019    Education Details educated on getting toe cap added to shoe    Person(s) Educated Patient    Methods Explanation;Handout    Comprehension Verbalized understanding            PT Short Term Goals - 12/30/20 1022       PT SHORT TERM GOAL #1   Title Patient/family member will report complteing household ambulation with RW at least 2x/day (All STGs Due: 01/02/21)    Baseline currently ambulating multiple times daily per patient reports, daughter not present today    Time 3    Period Weeks    Status Achieved    Target Date 01/02/21      PT SHORT TERM GOAL #2   Title Patient will improve 5x sit <> stand to </= 30 seconds with UE support to demonstrate improved balance    Baseline 35.97 secs; 28.56 secs with UE support    Time 3    Period Weeks    Status Achieved      PT SHORT TERM GOAL #3   Title Patient will improve TUG to </= to 68 seconds to demonstrate improved balance and reduced fall risk    Baseline 73.41 secs; 53.21 secs    Time 3    Period Weeks    Status Achieved             PT Long Term Goals - 12/09/20 2016      PT LONG TERM GOAL #1   Title Patient will be independent with final HEP with caregiver assistance, and report compliance with daily walking within the home with assistance (ALL LTGs Due: 01/23/21)    Baseline 12/09/20: current program was reviewed and updated this session, will benefit from further program advancement as pt progresses    Time 6    Period Weeks    Status Revised    Target Date 01/23/21      PT LONG TERM GOAL #2   Title Patient will be able to ambulate >/= 200 ft on indoor level surfaces with CGA and LRAD to demonstrate improved household mobility    Baseline 12/07/20: 115' with min guard/ocassional min A  with RW    Time 6    Period Weeks    Status Revised      PT LONG TERM GOAL #3   Title Patient will completed 5x sit <> stand in </= 25 seconds to demonstrate reduced fall risk and improved balance    Baseline 35.97 secs    Time 6    Period Weeks    Status Revised      PT LONG TERM GOAL #4   Title Patient will improve TUG to </= 60 seconds to demonstrate reduced fall risk and improved balance    Baseline T2/4/22: 73.41 sec's with RW/brace on left LE     Time 6    Period Weeks    Status Revised      PT LONG TERM GOAL #5   Title Patient will improve gait speed to >/= 0.80 ft/sec to  demonstrate improved tolerance for household mobility    Baseline 0.58 ft/sec    Time 6    Period Weeks    Status New                 Plan - 01/06/21 1100    Clinical Impression Statement Today's skilled session focused on continued gait training, patient demonsrtating increased fatigue in BLE (reports especially knees) with last lap. Continued activites focused on LLE strengthening and improved muscle recruitment, patient tolerating well with intermittent rest breaks. PT educating patient/daughter on toe cap added to shoe. Will continue to progress toward all LTGs.    Personal Factors and Comorbidities Comorbidity 3+;Time since onset of injury/illness/exacerbation;Age    Comorbidities R Basal Ganglia CVA, Hyperlipidemia, Hypothyroidism, Asthma, Glaucoma, HTN    Examination-Activity Limitations Bed Mobility;Dressing;Locomotion Level;Reach Overhead;Stairs;Stand;Transfers    Examination-Participation Restrictions Psychiatric nurse Evolving/Moderate complexity    Rehab Potential Fair    PT Frequency 2x / week    PT Duration 6 weeks    PT Treatment/Interventions ADLs/Self Care Home Management;Cryotherapy;Electrical Stimulation;Moist Heat;DME Instruction;Gait training;Stair training;Functional mobility training;Therapeutic activities;Therapeutic exercise;Balance training;Neuromuscular re-education;Patient/family education;Orthotic Fit/Training;Manual techniques;Passive range of motion    PT Next Visit Plan Did they get toe cap?Marland Kitchen Continue to work on LE strengthening. stair training. continue with use of anterior Ottobock brace. Continue to work on standing posture and weight shifitng, balance on compliant surfaces.    PT Home Exercise Plan Access Code: [email or web address]    Consulted and Agree with Plan of Care Patient            Patient will benefit from skilled therapeutic intervention in order to improve the following deficits and impairments:  Abnormal gait,Decreased balance,Decreased mobility,Decreased endurance,Difficulty walking,Impaired tone,Impaired sensation,Pain,Decreased strength,Decreased safety awareness,Decreased knowledge of use of DME,Decreased coordination,Decreased activity tolerance,Decreased range of motion  Visit Diagnosis: Hemiplegia and hemiparesis following cerebral infarction affecting left non-dominant side (HCC)  Muscle weakness (generalized)  Other abnormalities of gait and mobility  Unsteadiness on feet  Difficulty in walking, not elsewhere classified     Problem List Patient Active Problem List   Diagnosis Date Noted  . Spasticity 11/30/2020  . Wheelchair dependence 10/07/2020  . Left knee pain 07/26/2020  . Cerebrovascular accident (CVA) of right basal ganglia (HCC) 07/26/2020  . Left hemiparesis (HCC) 07/25/2020  . Hypokalemia 07/20/2020  . Right basal ganglia embolic stroke (HCC) 07/20/2020  . Essential hypertension 07/20/2020  . Hypothyroidism 07/20/2020    Tempie Donning, PT, DPT 01/06/2021, 11:02 AM  [facility] [address] [place], [place], [address] Phone: [phone]   Fax:  [phone]  Name: RAYANNE PADMANABHAN MRN: [id number] Date of Birth: [birth year]

## 2021-01-10 ENCOUNTER — Other Ambulatory Visit: Payer: Self-pay

## 2021-01-10 ENCOUNTER — Encounter: Payer: Self-pay | Admitting: Physical Therapy

## 2021-01-10 ENCOUNTER — Encounter: Payer: Self-pay | Admitting: Occupational Therapy

## 2021-01-10 ENCOUNTER — Ambulatory Visit: Payer: Medicare Other | Admitting: Physical Therapy

## 2021-01-10 ENCOUNTER — Ambulatory Visit: Payer: Medicare Other | Admitting: Occupational Therapy

## 2021-01-10 DIAGNOSIS — M25612 Stiffness of left shoulder, not elsewhere classified: Secondary | ICD-10-CM

## 2021-01-10 DIAGNOSIS — M25512 Pain in left shoulder: Secondary | ICD-10-CM

## 2021-01-10 DIAGNOSIS — R262 Difficulty in walking, not elsewhere classified: Secondary | ICD-10-CM | POA: Diagnosis not present

## 2021-01-10 DIAGNOSIS — M6281 Muscle weakness (generalized): Secondary | ICD-10-CM

## 2021-01-10 DIAGNOSIS — R2689 Other abnormalities of gait and mobility: Secondary | ICD-10-CM

## 2021-01-10 DIAGNOSIS — I69354 Hemiplegia and hemiparesis following cerebral infarction affecting left non-dominant side: Secondary | ICD-10-CM

## 2021-01-10 DIAGNOSIS — R2681 Unsteadiness on feet: Secondary | ICD-10-CM | POA: Diagnosis not present

## 2021-01-10 DIAGNOSIS — R278 Other lack of coordination: Secondary | ICD-10-CM

## 2021-01-10 NOTE — Therapy (Signed)
[facility] [address] [place], [place], [address] Phone: [phone]   Fax:  [phone]  Physical Therapy Treatment  Patient Details  Name: Nicole [name] MRN: [id number] Date of Birth: 01/02/1930 Referring Provider (PT): Genice Rouge, MD   Encounter Date: 01/10/2021   PT End of Session - 01/10/21 1232    Visit Number 22    Number of Visits 25    Date for PT Re-Evaluation 02/10/21   POC for 6 weeks, Cert for 60 days   Authorization Type BCBS Medicare    Progress Note Due on Visit 29   (Progress Note completed on 19th visit)   PT Start Time 1149    PT Stop Time 1229    PT Time Calculation (min) 40 min    Equipment Utilized During Treatment Gait belt    Activity Tolerance Patient tolerated treatment well    Behavior During Therapy WFL for tasks assessed/performed           Past Medical History:  Diagnosis Date  . Hypertension   . Stroke [facility])    September 2021  . Thyroid disease     History reviewed. No pertinent surgical history.  There were no vitals filed for this visit.   Subjective Assessment - 01/10/21 1152    Subjective Got the toe cap on her left shoe. Has been walking some at home without her brace on. No falls.    Patient is accompained by: Family member   CNA, Pam   Pertinent History R Basal Ganglia CVA, Hyperlipidemia, Hypothyroidism, Asthma, Glaucoma, HTN    Limitations Standing;Walking    Patient Stated Goals Be able to walk;    Currently in Pain? No/denies                             [facility] Adult PT Treatment/Exercise - 01/10/21 0001      Transfers   Transfers Sit to Stand;Stand to Sit    Sit to Stand 4: Min guard;With upper extremity assist    Sit to Stand Details (indicate cue type and reason) multiple reps performed throughout session    Stand to Sit 4: Min guard;With upper extremity assist;To bed;To chair/3-in-1    Stand to Sit Details cues for slowed descent       Ambulation/Gait   Ambulation/Gait Yes    Ambulation/Gait Assistance 4: Min guard;5: Supervision    Ambulation/Gait Assistance Details pt with L toe cap on shoe. able to perform with min guard/supervision with pt taking consistently longer steps with RLE    Ambulation Distance (Feet) 230 Feet   x1   Assistive device Rolling walker    Gait Pattern Step-to pattern;Step-through pattern;Decreased step length - right;Decreased step length - left;Decreased stance time - left;Decreased hip/knee flexion - left;Decreased stride length;Decreased weight shift to left;Left flexed knee in stance;Antalgic;Trunk flexed;Narrow base of support    Ambulation Surface Level;Indoor    Stairs Yes    Stairs Assistance 4: Min guard;4: Min assist    Stairs Assistance Details (indicate cue type and reason) continued stair training with CGA ascending, but needing min A when descending, pt with tendency to come down at an angle. cues to advance hands up/down railing    Stair Management Technique Two rails;Step to pattern;Forwards    Number of Stairs 4   x2   Height of Stairs 6    Gait Comments pt reports that she is not doing a lot of walking  at home and when she does walk is not wearing her AFO. had discussion with pt and pt's caregiver to make sure pt has AFO on for standing/gait activity for stability and support to knee. also discussed beginning a walking program at home with pt - walking around one of her larger rooms or up and down the hallway a couple times, trying to aim for 3 times per day in order for incr carryover and home and to build up strength/endurance. as pt reports she is mainly just walking in [place]. both verbalized understanding.      Neuro Re-ed    Neuro Re-ed Details  standing at RW: 2 x 10 reps with LLE foot taps to 4" step for hip/knee flexion strengthening, x10 reps tapping RLE to 4" step for incr SLS/stance time on RLE. attempted side stepping out with RLE and stance on LLE, pt reporting needing  to sit down after 5 reps due to feelings of LLE feeling like it would give out               Balance Exercises - 01/10/21 0001      Balance Exercises: Standing   Standing Eyes Opened Wide (BOA);Head turns;Foam/compliant surface;Other reps (comment);Limitations    Standing Eyes Opened Limitations using mirror and chair anteriorly for safety: standing on air ex with BUE support:  weight shifting 2 x 10 reps with focus on shifitng weight to LLE, then multiple reps attempting to let go and hold balance for approx. 10 seconds, then x5 reps head turns (pt needing visual cues on how to properly perform)             PT Education - 01/10/21 1231    Education Details walking program for home (see gait section), using AFO for all standing activity/gait at home.    Person(s) Educated Patient    Methods Explanation    Comprehension Verbalized understanding            PT Short Term Goals - 12/30/20 1022      PT SHORT TERM GOAL #1   Title Patient/family member will report complteing household ambulation with RW at least 2x/day (All STGs Due: 01/02/21)    Baseline currently ambulating multiple times daily per patient reports, daughter not present today    Time 3    Period Weeks    Status Achieved    Target Date 01/02/21      PT SHORT TERM GOAL #2   Title Patient will improve 5x sit <> stand to </= 30 seconds with UE support to demonstrate improved balance    Baseline 35.97 secs; 28.56 secs with UE support    Time 3    Period Weeks    Status Achieved      PT SHORT TERM GOAL #3   Title Patient will improve TUG to </= to 68 seconds to demonstrate improved balance and reduced fall risk    Baseline 73.41 secs; 53.21 secs    Time 3    Period Weeks    Status Achieved             PT Long Term Goals - 12/09/20 2016      PT LONG TERM GOAL #1   Title Patient will be independent with final HEP with caregiver assistance, and report compliance with daily walking within the home with  assistance (ALL LTGs Due: 01/23/21)    Baseline 12/09/20: current program was reviewed and updated this session, will benefit from further program advancement as pt progresses  Time 6    Period Weeks    Status Revised    Target Date 01/23/21      PT LONG TERM GOAL #2   Title Patient will be able to ambulate >/= 200 ft on indoor level surfaces with CGA and LRAD to demonstrate improved household mobility    Baseline 12/07/20: 115' with min guard/ocassional min A  with RW    Time 6    Period Weeks    Status Revised      PT LONG TERM GOAL #3   Title Patient will completed 5x sit <> stand in </= 25 seconds to demonstrate reduced fall risk and improved balance    Baseline 35.97 secs    Time 6    Period Weeks    Status Revised      PT LONG TERM GOAL #4   Title Patient will improve TUG to </= 60 seconds to demonstrate reduced fall risk and improved balance    Baseline T2/4/22: 73.41 sec's with RW/brace on left LE    Time 6    Period Weeks    Status Revised      PT LONG TERM GOAL #5   Title Patient will improve gait speed to >/= 0.80 ft/sec to demonstrate improved tolerance for household mobility    Baseline 0.58 ft/sec    Time 6    Period Weeks    Status New                 Plan - 01/10/21 1443    Clinical Impression Statement Pt got toe cap on L shoe with pt demonstrating no instances of catching L foot during gait. Able to take consistent longer steps with RLE. Continued to focus on balance strategies, LLE strengthening, and stair training. Pt continues to need min guard when ascending and min A for descending stairs for balance and safety. Will continue to progress towards LTGs.    Personal Factors and Comorbidities Comorbidity 3+;Time since onset of injury/illness/exacerbation;Age    Comorbidities R Basal Ganglia CVA, Hyperlipidemia, Hypothyroidism, Asthma, Glaucoma, HTN    Examination-Activity Limitations Bed Mobility;Dressing;Locomotion Level;Reach  Overhead;Stairs;Stand;Transfers    Examination-Participation Restrictions Psychiatric nurse Evolving/Moderate complexity    Rehab Potential Fair    PT Frequency 2x / week    PT Duration 6 weeks    PT Treatment/Interventions ADLs/Self Care Home Management;Cryotherapy;Electrical Stimulation;Moist Heat;DME Instruction;Gait training;Stair training;Functional mobility training;Therapeutic activities;Therapeutic exercise;Balance training;Neuromuscular re-education;Patient/family education;Orthotic Fit/Training;Manual techniques;Passive range of motion    PT Next Visit Plan has pt been walking more at home? Continue to work on LE strengthening. stair training. continue with use of anterior Ottobock brace. Continue to work on standing posture and weight shifitng, balance on compliant surfaces.    PT Home Exercise Plan Access Code: [email or web address]    Consulted and Agree with Plan of Care Patient           Patient will benefit from skilled therapeutic intervention in order to improve the following deficits and impairments:  Abnormal gait,Decreased balance,Decreased mobility,Decreased endurance,Difficulty walking,Impaired tone,Impaired sensation,Pain,Decreased strength,Decreased safety awareness,Decreased knowledge of use of DME,Decreased coordination,Decreased activity tolerance,Decreased range of motion  Visit Diagnosis: Hemiplegia and hemiparesis following cerebral infarction affecting left non-dominant side (HCC)  Other lack of coordination  Muscle weakness (generalized)  Other abnormalities of gait and mobility  Unsteadiness on feet     Problem List Patient Active Problem List   Diagnosis Date Noted  . Spasticity 11/30/2020  . Wheelchair dependence 10/07/2020  . Left knee  pain 07/26/2020  . Cerebrovascular accident (CVA) of right basal ganglia (HCC) 07/26/2020  . Left hemiparesis (HCC) 07/25/2020  . Hypokalemia 07/20/2020  . Right basal  ganglia embolic stroke (HCC) 07/20/2020  . Essential hypertension 07/20/2020  . Hypothyroidism 07/20/2020    Drake Leach, PT, DPT  01/10/2021, 2:45 PM   [facility] [address] [place], [place], [address] Phone: [phone]   Fax:  [phone]  Name: Nicole [name] MRN: [id number] Date of Birth: [birth year]

## 2021-01-10 NOTE — Therapy (Signed)
[facility] [address] [place], [place], [address] Phone: [phone]   Fax:  [phone]  Occupational Therapy Treatment  Patient Details  Name: Nicole [name] MRN: [id number] Date of Birth: [birth year] Referring Provider (OT): Genice Rouge, MD   Encounter Date: 01/10/2021   OT End of Session - 01/10/21 1230    Visit Number 18    Number of Visits 23    Date for OT Re-Evaluation 01/27/21    Authorization Type BCBS Medicare - WEEK 2/4 (as of 01/10/21)    Authorization Time Period $40 Copay VL:MN No Auth required  10th visit PN     Authorization - Visit Number 18    Authorization - Number of Visits 23    Progress Note Due on Visit 25    OT Start Time 1230    OT Stop Time 1312    OT Time Calculation (min) 42 min    Activity Tolerance Patient tolerated treatment well    Behavior During Therapy [facility] for tasks assessed/performed           Past Medical History:  Diagnosis Date  . Hypertension   . Stroke [facility])    September 2021  . Thyroid disease     History reviewed. No pertinent surgical history.  There were no vitals filed for this visit.   Subjective Assessment - 01/10/21 1231    Subjective  "nothing fun" when asked what she did this weekend. Pt denies any pain.    Patient is accompanied by: Family member    Pertinent History CVA R basal ganglia, HTN, HLD, hypothyroidism, asthma, glaucoma    Currently in Pain? No/denies                        OT Treatments/Exercises (OP) - 01/10/21 1303      Neurological Re-education Exercises   Other Exercises 1 worked on low level reaching with LUE. Pt demosntrates improved shoulder flexion and decrease in compensatory movements but continues to required min to mod verbal and tactile cue sfor lateral flexion to right and shoulder hiking.    Other Exercises 2 Worked on bimanual cooridnation wit tossing medium size ball back and forth with good attention to  left UE and minimal errors. Pt with good use and incorporating LUE. medium ball for shoulder flexion x 10, circumduction with uweighted cane x 10 - all exercises in sitting                    OT Short Term Goals - 12/30/20 0933      OT SHORT TERM GOAL #1   Title Pt will be independent with HEP (12/07/20)    Time 4    Period Weeks    Status Achieved    Target Date 12/07/20      OT SHORT TERM GOAL #2   Title Pt will improve grip strength in LUE by 3 lbs or greater for increase in functional use of LUE and increase independence with clothing management.    Baseline RUE 42.1 LUE 26.8    Time 4    Period Weeks    Status Achieved   37.9 LUE     OT SHORT TERM GOAL #3   Title Pt will obtain item from mid level shelf with increase in shoulder flexion to 70 degrees with good positioning and reduction in shoulder hiking and compensatory movements.    Baseline LUE 66 degrees sh flexion    Time  4    Period Weeks    Status Achieved   90* on 12/23/2020     OT SHORT TERM GOAL #4   Title Pt will demonstrate improved box and blocks score to 35 or greater with LUE for increase in functional use of LUE.    Baseline 29 LUE    Time 4    Period Weeks    Status On-going   12/30/20 - 33 blocks with significant shoulder hiking on LUE     OT SHORT TERM GOAL #5   Title Pt will verbalize understanding of sleep positions for LUE to decrease pain.    Baseline 6/10 LUE shoulder pain at night    Time 4    Period Weeks    Status Achieved      OT SHORT TERM GOAL #6   Title Pt will don bra with mod I    Baseline daughter currently assisting    Time 4    Period Weeks    Status Achieved   12/07/20 - pt reports donning bra all clothing            OT Long Term Goals - 12/30/20 0940      OT LONG TERM GOAL #1   Title Pt will be independent with updated HEP 01/04/2021    Time 8    Period Weeks    Status On-going   pt completing shoulder HEPs - ongoing to update PRN     OT LONG TERM GOAL #2    Title Pt will demonstrate increased grip strength in LUE to 33 lbs or greater for improved functional use. UPDATE: 37 lbs or greater    Baseline RUE 42.1 LUE 26.8    Time 8    Period Weeks    Status Revised   33.7 lbs on 12/30/2020 - upgraded goal for continuing to work on increasing grip strength     OT LONG TERM GOAL #3   Title Pt will perform UB and LB dressing with mod I.    Time 8    Period Weeks    Status Achieved   pt and caregiver report pt is doing all dressing except shower days     OT LONG TERM GOAL #4   Title Pt will increase fine motor coordination by completing 9 hole peg test in 50 seconds or less with LUE. Upgraded 12/23/2020: 45seconds or less with LUE UPDATE: 38s or less with L elbow on table    Baseline RUE 27.47 LUE 61.87    Time 8    Period Weeks    Status Revised   12/23/2020: 50.19s ; 12/30/20 41.62s w elbow on table. upgraded to continue to target fine motor coordination     OT LONG TERM GOAL #5   Title Pt will improve shoulder flexion to at least 100 degrees in order to obtain object and increase functional reach.    Baseline LUE 66 degrees sh flexion    Time 8    Period Weeks    Status On-going   59* on 12/30/2020 - continue working on LUE shoulder flexion and reach     OT LONG TERM GOAL #6   Title Pt will report decrease in pain in LUE shoulder at night to less than or equal to 4/10 with improved sleep positioning and range of motion    Time 8    Period Weeks    Status Achieved  Plan - 01/10/21 1315    Clinical Impression Statement Pt with improved movement patterns and shoulder flexion with LUE this day. Pt continuing to progress towards goals.    OT Occupational Profile and History Problem Focused Assessment - Including review of records relating to presenting problem    Occupational performance deficits (Please refer to evaluation for details): ADL's;IADL's    Body Structure / Function / Physical Skills  ADL;Balance;Coordination;IADL;Pain;GMC;Hearing;Strength;Tone;Sensation;Dexterity;Decreased knowledge of use of DME;FMC;UE functional use;ROM;Flexibility    Cognitive Skills Attention;Sequencing;Problem Solve    OT Frequency 2x / week    OT Duration 4 weeks   renewal completed for 2x/week for 4 weeks   OT Treatment/Interventions Self-care/ADL training;Cryotherapy;Ultrasound;Moist Heat;Electrical Stimulation;Paraffin;Energy conservation;DME and/or AE instruction;Manual Therapy;Functional Mobility Training;Neuromuscular education;Therapeutic exercise;Visual/perceptual remediation/compensation;Patient/family education;Balance training;Therapeutic activities;Passive range of motion;Cognitive remediation/compensation    Plan work on using LUE with more funtional tasks    Consulted and Agree with Plan of Care Family member/caregiver;Patient    Family Member Consulted caregiever Pam           Patient will benefit from skilled therapeutic intervention in order to improve the following deficits and impairments:   Body Structure / Function / Physical Skills: ADL,Balance,Coordination,IADL,Pain,GMC,Hearing,Strength,Tone,Sensation,Dexterity,Decreased knowledge of use of DME,FMC,UE functional use,ROM,Flexibility Cognitive Skills: Attention,Sequencing,Problem Solve     Visit Diagnosis: Hemiplegia and hemiparesis following cerebral infarction affecting left non-dominant side (HCC)  Stiffness of left shoulder, not elsewhere classified  Other lack of coordination  Acute pain of left shoulder  Muscle weakness (generalized)    Problem List Patient Active Problem List   Diagnosis Date Noted  . Spasticity 11/30/2020  . Wheelchair dependence 10/07/2020  . Left knee pain 07/26/2020  . Cerebrovascular accident (CVA) of right basal ganglia (HCC) 07/26/2020  . Left hemiparesis (HCC) 07/25/2020  . Hypokalemia 07/20/2020  . Right basal ganglia embolic stroke (HCC) 07/20/2020  . Essential hypertension  07/20/2020  . Hypothyroidism 07/20/2020    Junious Dresser MOT, OTR/L  01/10/2021, 1:18 PM  [place] [facility] [address] [place], [place], [address] Phone: [phone]   Fax:  [phone]  Name: Nicole [name] MRN: [id number] Date of Birth: 04/30/1930

## 2021-01-13 ENCOUNTER — Encounter: Payer: Self-pay | Admitting: Physical Therapy

## 2021-01-13 ENCOUNTER — Ambulatory Visit: Payer: Medicare Other | Admitting: Occupational Therapy

## 2021-01-13 ENCOUNTER — Encounter: Payer: Self-pay | Admitting: Physical Medicine and Rehabilitation

## 2021-01-13 ENCOUNTER — Ambulatory Visit: Payer: Medicare Other | Admitting: Physical Therapy

## 2021-01-13 ENCOUNTER — Encounter: Payer: Self-pay | Admitting: Occupational Therapy

## 2021-01-13 ENCOUNTER — Other Ambulatory Visit: Payer: Self-pay

## 2021-01-13 ENCOUNTER — Encounter: Payer: Medicare Other | Attending: Registered Nurse | Admitting: Physical Medicine and Rehabilitation

## 2021-01-13 VITALS — BP 157/73 | HR 77 | Temp 97.9°F | Ht 64.0 in | Wt 150.0 lb

## 2021-01-13 DIAGNOSIS — M6281 Muscle weakness (generalized): Secondary | ICD-10-CM

## 2021-01-13 DIAGNOSIS — R262 Difficulty in walking, not elsewhere classified: Secondary | ICD-10-CM

## 2021-01-13 DIAGNOSIS — I69354 Hemiplegia and hemiparesis following cerebral infarction affecting left non-dominant side: Secondary | ICD-10-CM | POA: Diagnosis not present

## 2021-01-13 DIAGNOSIS — R2689 Other abnormalities of gait and mobility: Secondary | ICD-10-CM | POA: Diagnosis not present

## 2021-01-13 DIAGNOSIS — G8194 Hemiplegia, unspecified affecting left nondominant side: Secondary | ICD-10-CM | POA: Insufficient documentation

## 2021-01-13 DIAGNOSIS — I639 Cerebral infarction, unspecified: Secondary | ICD-10-CM | POA: Insufficient documentation

## 2021-01-13 DIAGNOSIS — R2681 Unsteadiness on feet: Secondary | ICD-10-CM

## 2021-01-13 DIAGNOSIS — R278 Other lack of coordination: Secondary | ICD-10-CM | POA: Diagnosis not present

## 2021-01-13 DIAGNOSIS — M25612 Stiffness of left shoulder, not elsewhere classified: Secondary | ICD-10-CM | POA: Diagnosis not present

## 2021-01-13 DIAGNOSIS — R252 Cramp and spasm: Secondary | ICD-10-CM | POA: Diagnosis not present

## 2021-01-13 DIAGNOSIS — M25512 Pain in left shoulder: Secondary | ICD-10-CM

## 2021-01-13 MED ORDER — BACLOFEN 10 MG PO TABS: 10.0000 mg | ORAL_TABLET | Freq: Four times a day (QID) | ORAL | 5 refills | Status: AC

## 2021-01-13 NOTE — Therapy (Signed)
[facility] [address] [place], [place], [address] Phone: [phone]   Fax:  [phone]  Occupational Therapy Treatment  Patient Details  Name: Nicole [name] MRN: [id number] Date of Birth: [birth year] Referring Provider (OT): Genice Rouge, MD   Encounter Date: 01/13/2021   OT End of Session - 01/13/21 1100    Visit Number 19    Number of Visits 23    Date for OT Re-Evaluation 01/27/21    Authorization Type [employer] Medicare - WEEK 2/4 (as of 01/10/21)    Authorization Time Period $40 Copay VL:MN No Auth required  10th visit PN     Authorization - Visit Number 19    Authorization - Number of Visits 23    Progress Note Due on Visit 25    OT Start Time 1100    OT Stop Time 1145    OT Time Calculation (min) 45 min    Activity Tolerance Patient tolerated treatment well    Behavior During Therapy [facility] for tasks assessed/performed           Past Medical History:  Diagnosis Date  . Hypertension   . Stroke [facility])    September 2021  . Thyroid disease     History reviewed. No pertinent surgical history.  There were no vitals filed for this visit.   Subjective Assessment - 01/13/21 1100    Subjective  Pt says "no pain"    Patient is accompanied by: Family member    Pertinent History CVA R basal ganglia, HTN, HLD, hypothyroidism, asthma, glaucoma    Currently in Pain? No/denies                        OT Treatments/Exercises (OP) - 01/13/21 1235      Neurological Re-education Exercises   Other Exercises 1 neuromuscular reeducation for decreasing excessive activation with shoulder flexion of LUE. Pt demonstrates significant right lateral flexion and shoulder hiking on right for compensatory movements and required max facilitation this day for decreasing trap activation. worked on facilitating trunk and hip dissociation for postural and positioning for body during upright sitting      Manual Therapy    Manual Therapy Soft tissue mobilization    Soft tissue mobilization left trap                    OT Short Term Goals - 12/30/20 0933      OT SHORT TERM GOAL #1   Title Pt will be independent with HEP (12/07/20)    Time 4    Period Weeks    Status Achieved    Target Date 12/07/20      OT SHORT TERM GOAL #2   Title Pt will improve grip strength in LUE by 3 lbs or greater for increase in functional use of LUE and increase independence with clothing management.    Baseline RUE 42.1 LUE 26.8    Time 4    Period Weeks    Status Achieved   37.9 LUE     OT SHORT TERM GOAL #3   Title Pt will obtain item from mid level shelf with increase in shoulder flexion to 70 degrees with good positioning and reduction in shoulder hiking and compensatory movements.    Baseline LUE 66 degrees sh flexion    Time 4    Period Weeks    Status Achieved   90* on 12/23/2020     OT SHORT TERM GOAL #  4   Title Pt will demonstrate improved box and blocks score to 35 or greater with LUE for increase in functional use of LUE.    Baseline 29 LUE    Time 4    Period Weeks    Status On-going   12/30/20 - 33 blocks with significant shoulder hiking on LUE     OT SHORT TERM GOAL #5   Title Pt will verbalize understanding of sleep positions for LUE to decrease pain.    Baseline 6/10 LUE shoulder pain at night    Time 4    Period Weeks    Status Achieved      OT SHORT TERM GOAL #6   Title Pt will don bra with mod I    Baseline daughter currently assisting    Time 4    Period Weeks    Status Achieved   12/07/20 - pt reports donning bra all clothing            OT Long Term Goals - 12/30/20 0940      OT LONG TERM GOAL #1   Title Pt will be independent with updated HEP 01/04/2021    Time 8    Period Weeks    Status On-going   pt completing shoulder HEPs - ongoing to update PRN     OT LONG TERM GOAL #2   Title Pt will demonstrate increased grip strength in LUE to 33 lbs or greater for improved  functional use. UPDATE: 37 lbs or greater    Baseline RUE 42.1 LUE 26.8    Time 8    Period Weeks    Status Revised   33.7 lbs on 12/30/2020 - upgraded goal for continuing to work on increasing grip strength     OT LONG TERM GOAL #3   Title Pt will perform UB and LB dressing with mod I.    Time 8    Period Weeks    Status Achieved   pt and caregiver report pt is doing all dressing except shower days     OT LONG TERM GOAL #4   Title Pt will increase fine motor coordination by completing 9 hole peg test in 50 seconds or less with LUE. Upgraded 12/23/2020: 45seconds or less with LUE UPDATE: 38s or less with L elbow on table    Baseline RUE 27.47 LUE 61.87    Time 8    Period Weeks    Status Revised   12/23/2020: 50.19s ; 12/30/20 41.62s w elbow on table. upgraded to continue to target fine motor coordination     OT LONG TERM GOAL #5   Title Pt will improve shoulder flexion to at least 100 degrees in order to obtain object and increase functional reach.    Baseline LUE 66 degrees sh flexion    Time 8    Period Weeks    Status On-going   47* on 12/30/2020 - continue working on LUE shoulder flexion and reach     OT LONG TERM GOAL #6   Title Pt will report decrease in pain in LUE shoulder at night to less than or equal to 4/10 with improved sleep positioning and range of motion    Time 8    Period Weeks    Status Achieved                 Plan - 01/13/21 1239    Clinical Impression Statement Pt with improved movement patterns and shoulder flexion with LUE this day. Pt  continuing to progress towards goals.    OT Occupational Profile and History Problem Focused Assessment - Including review of records relating to presenting problem    Occupational performance deficits (Please refer to evaluation for details): ADL's;IADL's    Body Structure / Function / Physical Skills ADL;Balance;Coordination;IADL;Pain;GMC;Hearing;Strength;Tone;Sensation;Dexterity;Decreased knowledge of use of  DME;FMC;UE functional use;ROM;Flexibility    Cognitive Skills Attention;Sequencing;Problem Solve    OT Frequency 2x / week    OT Duration 4 weeks   renewal completed for 2x/week for 4 weeks   OT Treatment/Interventions Self-care/ADL training;Cryotherapy;Ultrasound;Moist Heat;Electrical Stimulation;Paraffin;Energy conservation;DME and/or AE instruction;Manual Therapy;Functional Mobility Training;Neuromuscular education;Therapeutic exercise;Visual/perceptual remediation/compensation;Patient/family education;Balance training;Therapeutic activities;Passive range of motion;Cognitive remediation/compensation    Plan work on using LUE with more funtional tasks    Consulted and Agree with Plan of Care Family member/caregiver;Patient    Family Member Consulted caregiever Pam           Patient will benefit from skilled therapeutic intervention in order to improve the following deficits and impairments:   Body Structure / Function / Physical Skills: ADL,Balance,Coordination,IADL,Pain,GMC,Hearing,Strength,Tone,Sensation,Dexterity,Decreased knowledge of use of DME,FMC,UE functional use,ROM,Flexibility Cognitive Skills: Attention,Sequencing,Problem Solve     Visit Diagnosis: Hemiplegia and hemiparesis following cerebral infarction affecting left non-dominant side (HCC)  Stiffness of left shoulder, not elsewhere classified  Other lack of coordination  Acute pain of left shoulder  Muscle weakness (generalized)    Problem List Patient Active Problem List   Diagnosis Date Noted  . Spasticity 11/30/2020  . Wheelchair dependence 10/07/2020  . Left knee pain 07/26/2020  . Cerebrovascular accident (CVA) of right basal ganglia (HCC) 07/26/2020  . Left hemiparesis (HCC) 07/25/2020  . Hypokalemia 07/20/2020  . Right basal ganglia embolic stroke (HCC) 07/20/2020  . Essential hypertension 07/20/2020  . Hypothyroidism 07/20/2020    Junious Dresser MOT, OTR/L  01/13/2021, 12:39 PM  [facility] [address] [place], [place], [address] Phone: [phone]   Fax:  [phone]  Name: Nicole [name] MRN: [id number] Date of Birth: 08-Apr-1930

## 2021-01-13 NOTE — Progress Notes (Signed)
Subjective:    Patient ID: Nicole [name], female    DOB: 22-Apr-1930, 87 y.o.   MRN: [id number]  HPI Pt is an 87 yr old female with CVA of R basal ganglia with L hemiparesis here for f/u.  Not too sleepy on baclofen 10 mg 3x/day-  No constipation.  Thinks spasticity has improved.     Got L AFO- can walk now- with RW.  Got shoe pad under tip of shoe/toe- and can now walk on carpet.   Walks with PT- ~ 100 ft with RW max distance.   L hip hurts- when goes to bed at night. Is actually the lateral hip, no groin pain.  Hasn't taken Baclofen yet in AM when it hurts so bad- it's stiff and painful.  Is doing ROM 2x/day- but not 5-6x/day.   Pain Inventory Average Pain 6 Pain Right Now 0 My pain is dull  LOCATION OF PAIN hip  BOWEL Number of stools per week: 7  Oral laxative use No  Type of laxative na Enema or suppository use No  History of colostomy No  Incontinent No   BLADDER Normal In and out cath, frequency na Able to self cath na Bladder incontinence No  Frequent urination No  Leakage with coughing No  Difficulty starting stream No  Incomplete bladder emptying No    Mobility walk with assistance use a walker how many minutes can you walk? 20 use a wheelchair transfers alone  Function retired I need assistance with the following:  meal prep and shopping  Neuro/Psych No problems in this area  Prior Studies Any changes since last visit?  no  Physicians involved in your care Any changes since last visit?  no   Family History  Problem Relation Age of Onset  . Diabetes Mother   . Stroke Mother   . Diabetes Other   . Hypertension Other    Social History   Socioeconomic History  . Marital status: Single    Spouse name: Not on file  . Number of children: Not on file  . Years of education: Not on file  . Highest education level: Not on file  Occupational History  . Not on file  Tobacco Use  . Smoking status: Never Smoker  . Smokeless tobacco:  Never Used  Vaping Use  . Vaping Use: Never used  Substance and Sexual Activity  . Alcohol use: No    Alcohol/week: 0.0 standard drinks  . Drug use: No  . Sexual activity: Not on file  Other Topics Concern  . Not on file  Social History Narrative   Lives with daughter   Right handed   Drinks 1-2 cups caffeine daily   Social Determinants of Health   Financial Resource Strain: Not on file  Food Insecurity: Not on file  Transportation Needs: Not on file  Physical Activity: Not on file  Stress: Not on file  Social Connections: Not on file   No past surgical history on file. Past Medical History:  Diagnosis Date  . Hypertension   . Stroke [facility])    September 2021  . Thyroid disease    BP (!) 157/73   Pulse 77   Temp 97.9 F (36.6 C)   Ht 5\' 4"  (1.626 m)   Wt 150 lb (68 kg)   SpO2 93%   BMI 25.75 kg/m   Opioid Risk Score:   Fall Risk Score:  `1  Depression screen PHQ 2/9  Depression screen [facility] 2/9 08/26/2020  Decreased  Interest 1  Down, Depressed, Hopeless 1  PHQ - 2 Score 2  Altered sleeping 1  Tired, decreased energy 1  Change in appetite 3  Feeling bad or failure about yourself  3  Trouble concentrating 0  Moving slowly or fidgety/restless 0  Suicidal thoughts 1  PHQ-9 Score 11   Review of Systems  Musculoskeletal:       Hip pain  All other systems reviewed and are negative.      Objective:   Physical Exam Awake, alert, appropriate, accompanied by family member, in transport w/c, NAD TTP over L lateral hip- over trochanteric bursa.  MAS of 1+ in LLE at hip and 1+  To 2 in L knee- much better; and 1+in L ankle Wearing L Carbon fiber AFO MAS of L elbow 1+- but can extend much better- lacking 15 degrees of extension of L elbow- but tone is less overall; MAS of 1+ in shoulder and trace in L wrist.  MS: LUE- biceps 4+/5, triceps 4+/5, WE 4/5, grip 4/5, and finger abduction at least 3+/5- but having some apraxia signs during testing LLE- HF 4-/5, and  KE 4-/5; DF/PF- in AFO- so cannot test-   Mild L foot swelling- 1+ ankle/foot swelling.     Assessment & Plan:   Pt is an 87 yr old female with CVA of R basal ganglia with L hemiparesis here for f/u. With spasticity-   1. Suggest keeping in therapy for at least another 1-2 months-since is continuing to make strength gains as well as functional gains in L side.   2. Will con't baclofen at 10 mg in AM and afternoon, however I'd like to increase her night time dose to 15 mg nightly. If it helps, but isn't quite enough, can take 08/15/19 mg (so 20 mg at night).  3. Went over reasoning for doing ROM 5x/day- so when wakes up and goes ot bed and then breakfast,lunch and dinner- so 5x/day.   4. F/U in 2 months- to see how doing and titrate spasticity meds if needed  5. Just FYI, spasticity gets worse for 1-2 years past the stroke- this doesn't mean the stroke is worse- it's NORMAL!  I spent a total of 25 minutes on visit- as detailed above.

## 2021-01-13 NOTE — Patient Instructions (Signed)
Pt is an 87 yr old female with CVA of R basal ganglia with L hemiparesis here for f/u. With spasticity-   1. Suggest keeping in therapy for at least another 1-2 months-since is continuing to make strength gains as well as functional gains in L side.   2. Will con't baclofen at 10 mg in AM and afternoon, however I'd like to increase her night time dose to 15 mg nightly. If it helps, but isn't quite enough, can take 08/15/19 mg (so 20 mg at night).  3. Went over reasoning for doing ROM 5x/day- so when wakes up and goes ot bed and then breakfast,lunch and dinner- so 5x/day.   4. F/U in 2 months- to see how doing and titrate spasticity meds if needed  5. Just FYI, spasticity gets worse for 1-2 years past the stroke- this doesn't mean the stroke is worse- it's NORMAL!

## 2021-01-13 NOTE — Therapy (Signed)
[facility] [address] [place], [place], [address] Phone: [phone]   Fax:  [phone]  Physical Therapy Treatment  Patient Details  Name: Nicole [name] MRN: [id number] Date of Birth: Jun 25, 1930 Referring Provider (PT): Genice Rouge, MD   Encounter Date: 01/13/2021   PT End of Session - 01/13/21 1020    Visit Number 23    Number of Visits 25    Date for PT Re-Evaluation 02/10/21   POC for 6 weeks, Cert for 60 days   Authorization Type BCBS Medicare    Progress Note Due on Visit 29   (Progress Note completed on 19th visit)   PT Start Time 1017    PT Stop Time 1100    PT Time Calculation (min) 43 min    Equipment Utilized During Treatment Gait belt    Activity Tolerance Patient tolerated treatment well    Behavior During Therapy WFL for tasks assessed/performed           Past Medical History:  Diagnosis Date  . Hypertension   . Stroke [facility])    September 2021  . Thyroid disease     History reviewed. No pertinent surgical history.  There were no vitals filed for this visit.   Subjective Assessment - 01/13/21 1019    Subjective No new complaints. No falls or pain to report. Has not increased her walking at home. Has been wearing the brace at home since last session.    Patient is accompained by: Family member   pt's daughter in lobby   Pertinent History R Basal Ganglia CVA, Hyperlipidemia, Hypothyroidism, Asthma, Glaucoma, HTN    Limitations Standing;Walking    Patient Stated Goals Be able to walk;    Currently in Pain? No/denies    Pain Score 0-No pain                 [facility] Adult PT Treatment/Exercise - 01/13/21 1021      Transfers   Transfers Sit to Stand;Stand to Sit    Sit to Stand 4: Min guard;With upper extremity assist    Stand to Sit 4: Min guard;With upper extremity assist;To bed;To chair/3-in-1      Ambulation/Gait   Ambulation/Gait Yes    Ambulation/Gait Assistance 5:  Supervision;4: Min guard    Ambulation Distance (Feet) 230 Feet   x1, plus around gym with session   Assistive device Rolling walker    Gait Pattern Step-to pattern;Step-through pattern;Decreased step length - right;Decreased step length - left;Decreased stance time - left;Decreased hip/knee flexion - left;Decreased stride length;Decreased weight shift to left;Left flexed knee in stance;Antalgic;Trunk flexed;Narrow base of support    Ambulation Surface Level;Indoor    Stairs Yes    Stairs Assistance 4: Min guard;4: Min assist    Stairs Assistance Details (indicate cue type and reason) 1st rep with bil rails with min guard assist to ascend and descend with cues on weight shifting and to advance hands down the rails. 2cd rep with left rail only with pt partially sideways, min assist both ways with cues on technique, sequencing and weight shifting.    Stair Management Technique Two rails;One rail Left;Step to pattern;Forwards;Sideways    Number of Stairs 4   x2   Height of Stairs 6      Self-Care   Self-Care Other Self-Care Comments    Other Self-Care Comments  discused increasing her walking at home. Discussed walking for need vs walking to "just walk". Using RW to go to bathroom, kitchen,  living room, bedroom as needs arise vs using the wheelchair. Pt verbalized understanding.      Neuro Re-ed    Neuro Re-ed Details  for strengthening/activity tolearance: seated at edge of mat with right foot on 2 inch box for sit<>stands x 10 reps wi UE support to stand/sit controlled, min guard to min assist, no UE support upon standing; standing with bil UE support progressing to left UE support only for right foot taps to 4 inch box x10 reps with each support, min assist for balance/weight shifitng.                PT Short Term Goals - 12/30/20 1022      PT SHORT TERM GOAL #1   Title Patient/family member will report complteing household ambulation with RW at least 2x/day (All STGs Due: 01/02/21)     Baseline currently ambulating multiple times daily per patient reports, daughter not present today    Time 3    Period Weeks    Status Achieved    Target Date 01/02/21      PT SHORT TERM GOAL #2   Title Patient will improve 5x sit <> stand to </= 30 seconds with UE support to demonstrate improved balance    Baseline 35.97 secs; 28.56 secs with UE support    Time 3    Period Weeks    Status Achieved      PT SHORT TERM GOAL #3   Title Patient will improve TUG to </= to 68 seconds to demonstrate improved balance and reduced fall risk    Baseline 73.41 secs; 53.21 secs    Time 3    Period Weeks    Status Achieved             PT Long Term Goals - 12/09/20 2016      PT LONG TERM GOAL #1   Title Patient will be independent with final HEP with caregiver assistance, and report compliance with daily walking within the home with assistance (ALL LTGs Due: 01/23/21)    Baseline 12/09/20: current program was reviewed and updated this session, will benefit from further program advancement as pt progresses    Time 6    Period Weeks    Status Revised    Target Date 01/23/21      PT LONG TERM GOAL #2   Title Patient will be able to ambulate >/= 200 ft on indoor level surfaces with CGA and LRAD to demonstrate improved household mobility    Baseline 12/07/20: 115' with min guard/ocassional min A  with RW    Time 6    Period Weeks    Status Revised      PT LONG TERM GOAL #3   Title Patient will completed 5x sit <> stand in </= 25 seconds to demonstrate reduced fall risk and improved balance    Baseline 35.97 secs    Time 6    Period Weeks    Status Revised      PT LONG TERM GOAL #4   Title Patient will improve TUG to </= 60 seconds to demonstrate reduced fall risk and improved balance    Baseline T2/4/22: 73.41 sec's with RW/brace on left LE    Time 6    Period Weeks    Status Revised      PT LONG TERM GOAL #5   Title Patient will improve gait speed to >/= 0.80 ft/sec to demonstrate  improved tolerance for household mobility    Baseline 0.58 ft/sec  Time 6    Period Weeks    Status New                 Plan - 01/13/21 1020    Clinical Impression Statement Today's skilled session continued to focus on strengthening, gait with RW, stairs and balance training with emphasis on left LE weight bearing with rest breaks taken as needed. No other issues noted or reported in session. The pt is making slow, steady progress. Reinforced the need for increased walking at home as pt is still primarily using wheelchair at this time. The pt should benefit from continued PT to progress toward goals.    Personal Factors and Comorbidities Comorbidity 3+;Time since onset of injury/illness/exacerbation;Age    Comorbidities R Basal Ganglia CVA, Hyperlipidemia, Hypothyroidism, Asthma, Glaucoma, HTN    Examination-Activity Limitations Bed Mobility;Dressing;Locomotion Level;Reach Overhead;Stairs;Stand;Transfers    Examination-Participation Restrictions Psychiatric nurse Evolving/Moderate complexity    Rehab Potential Fair    PT Frequency 2x / week    PT Duration 6 weeks    PT Treatment/Interventions ADLs/Self Care Home Management;Cryotherapy;Electrical Stimulation;Moist Heat;DME Instruction;Gait training;Stair training;Functional mobility training;Therapeutic activities;Therapeutic exercise;Balance training;Neuromuscular re-education;Patient/family education;Orthotic Fit/Training;Manual techniques;Passive range of motion    PT Next Visit Plan has pt been walking more at home? begin to check goals for recert vs discharge, pt has 2 appointments left    PT Home Exercise Plan Access Code: [email or web address]    Consulted and Agree with Plan of Care Patient           Patient will benefit from skilled therapeutic intervention in order to improve the following deficits and impairments:  Abnormal gait,Decreased balance,Decreased mobility,Decreased  endurance,Difficulty walking,Impaired tone,Impaired sensation,Pain,Decreased strength,Decreased safety awareness,Decreased knowledge of use of DME,Decreased coordination,Decreased activity tolerance,Decreased range of motion  Visit Diagnosis: Hemiplegia and hemiparesis following cerebral infarction affecting left non-dominant side (HCC)  Muscle weakness (generalized)  Other abnormalities of gait and mobility  Unsteadiness on feet  Difficulty in walking, not elsewhere classified  Other lack of coordination     Problem List Patient Active Problem List   Diagnosis Date Noted  . Spasticity 11/30/2020  . Wheelchair dependence 10/07/2020  . Left knee pain 07/26/2020  . Cerebrovascular accident (CVA) of right basal ganglia (HCC) 07/26/2020  . Left hemiparesis (HCC) 07/25/2020  . Hypokalemia 07/20/2020  . Right basal ganglia embolic stroke (HCC) 07/20/2020  . Essential hypertension 07/20/2020  . Hypothyroidism 07/20/2020    Sallyanne Kuster, PTA, [facility] [address], [address] [place], [place] [address] [phone] 01/13/21, 4:11 PM   Name: Nicole [name] MRN: [id number] Date of Birth: 09-Jul-1930

## 2021-01-17 DIAGNOSIS — G8194 Hemiplegia, unspecified affecting left nondominant side: Secondary | ICD-10-CM | POA: Diagnosis not present

## 2021-01-17 DIAGNOSIS — I6381 Other cerebral infarction due to occlusion or stenosis of small artery: Secondary | ICD-10-CM | POA: Diagnosis not present

## 2021-01-17 DIAGNOSIS — I6350 Cerebral infarction due to unspecified occlusion or stenosis of unspecified cerebral artery: Secondary | ICD-10-CM | POA: Diagnosis not present

## 2021-01-17 DIAGNOSIS — M25562 Pain in left knee: Secondary | ICD-10-CM | POA: Diagnosis not present

## 2021-01-18 ENCOUNTER — Ambulatory Visit: Payer: Medicare Other | Admitting: Physical Therapy

## 2021-01-18 ENCOUNTER — Encounter: Payer: Self-pay | Admitting: Physical Therapy

## 2021-01-18 ENCOUNTER — Encounter: Payer: Self-pay | Admitting: Occupational Therapy

## 2021-01-18 ENCOUNTER — Ambulatory Visit: Payer: Medicare Other | Admitting: Occupational Therapy

## 2021-01-18 ENCOUNTER — Other Ambulatory Visit: Payer: Self-pay

## 2021-01-18 DIAGNOSIS — R262 Difficulty in walking, not elsewhere classified: Secondary | ICD-10-CM | POA: Diagnosis not present

## 2021-01-18 DIAGNOSIS — M6281 Muscle weakness (generalized): Secondary | ICD-10-CM | POA: Diagnosis not present

## 2021-01-18 DIAGNOSIS — R2689 Other abnormalities of gait and mobility: Secondary | ICD-10-CM

## 2021-01-18 DIAGNOSIS — R278 Other lack of coordination: Secondary | ICD-10-CM

## 2021-01-18 DIAGNOSIS — R2681 Unsteadiness on feet: Secondary | ICD-10-CM | POA: Diagnosis not present

## 2021-01-18 DIAGNOSIS — I69354 Hemiplegia and hemiparesis following cerebral infarction affecting left non-dominant side: Secondary | ICD-10-CM

## 2021-01-18 DIAGNOSIS — M25612 Stiffness of left shoulder, not elsewhere classified: Secondary | ICD-10-CM

## 2021-01-18 DIAGNOSIS — M25512 Pain in left shoulder: Secondary | ICD-10-CM | POA: Diagnosis not present

## 2021-01-18 NOTE — Therapy (Signed)
[facility] [address] [place], [place], [address] Phone: [phone]   Fax:  [phone]  Occupational Therapy Treatment  Patient Details  Name: Nicole [name] MRN: [id number] Date of Birth: 07/28/1930 Referring Provider (OT): Genice Rouge, MD   Encounter Date: 01/18/2021   OT End of Session - 01/18/21 1104    Visit Number 20    Number of Visits 23    Date for OT Re-Evaluation 01/27/21    Authorization Type BCBS Medicare - WEEK 3/4 (as of 01/18/21)    Authorization Time Period $40 Copay VL:MN No Auth required  10th visit PN     Authorization - Visit Number 20    Authorization - Number of Visits 23    Progress Note Due on Visit 25    OT Start Time 1103    OT Stop Time 1145    OT Time Calculation (min) 42 min    Activity Tolerance Patient tolerated treatment well    Behavior During Therapy WFL for tasks assessed/performed           Past Medical History:  Diagnosis Date  . Hypertension   . Stroke [facility])    September 2021  . Thyroid disease     History reviewed. No pertinent surgical history.  There were no vitals filed for this visit.   Subjective Assessment - 01/18/21 1104    Subjective  "i'm fine" Pt denies any pain.    Patient is accompanied by: Family member    Pertinent History CVA R basal ganglia, HTN, HLD, hypothyroidism, asthma, glaucoma    Currently in Pain? No/denies              Treatment  Neuromuscular Reeducation - with LUE for increase in reaching pattern and decreasing shoulder hiking and compensatory lateral flexion to right. PT demonstrated increased functional use of LUE and movement with increased receptiveness to cues and self-correction to compensatory strategies.   ADLs - pt demonstrated independence with tying shoes  Standing balance and weight shifting - Pt stood with SBA with verbal and tactile cueing for increasing weight into LLE for evening out stance for standing  tolerance with rolling walker. Pt did some weight shifting in standing laterally for weight bearing into LLE.                     OT Short Term Goals - 12/30/20 0933      OT SHORT TERM GOAL #1   Title Pt will be independent with HEP (12/07/20)    Time 4    Period Weeks    Status Achieved    Target Date 12/07/20      OT SHORT TERM GOAL #2   Title Pt will improve grip strength in LUE by 3 lbs or greater for increase in functional use of LUE and increase independence with clothing management.    Baseline RUE 42.1 LUE 26.8    Time 4    Period Weeks    Status Achieved   37.9 LUE     OT SHORT TERM GOAL #3   Title Pt will obtain item from mid level shelf with increase in shoulder flexion to 70 degrees with good positioning and reduction in shoulder hiking and compensatory movements.    Baseline LUE 66 degrees sh flexion    Time 4    Period Weeks    Status Achieved   90* on 12/23/2020     OT SHORT TERM GOAL #4   Title  Pt will demonstrate improved box and blocks score to 35 or greater with LUE for increase in functional use of LUE.    Baseline 29 LUE    Time 4    Period Weeks    Status On-going   12/30/20 - 33 blocks with significant shoulder hiking on LUE     OT SHORT TERM GOAL #5   Title Pt will verbalize understanding of sleep positions for LUE to decrease pain.    Baseline 6/10 LUE shoulder pain at night    Time 4    Period Weeks    Status Achieved      OT SHORT TERM GOAL #6   Title Pt will don bra with mod I    Baseline daughter currently assisting    Time 4    Period Weeks    Status Achieved   12/07/20 - pt reports donning bra all clothing            OT Long Term Goals - 12/30/20 0940      OT LONG TERM GOAL #1   Title Pt will be independent with updated HEP 01/04/2021    Time 8    Period Weeks    Status On-going   pt completing shoulder HEPs - ongoing to update PRN     OT LONG TERM GOAL #2   Title Pt will demonstrate increased grip strength in LUE to  33 lbs or greater for improved functional use. UPDATE: 37 lbs or greater    Baseline RUE 42.1 LUE 26.8    Time 8    Period Weeks    Status Revised   33.7 lbs on 12/30/2020 - upgraded goal for continuing to work on increasing grip strength     OT LONG TERM GOAL #3   Title Pt will perform UB and LB dressing with mod I.    Time 8    Period Weeks    Status Achieved   pt and caregiver report pt is doing all dressing except shower days     OT LONG TERM GOAL #4   Title Pt will increase fine motor coordination by completing 9 hole peg test in 50 seconds or less with LUE. Upgraded 12/23/2020: 45seconds or less with LUE UPDATE: 38s or less with L elbow on table    Baseline RUE 27.47 LUE 61.87    Time 8    Period Weeks    Status Revised   12/23/2020: 50.19s ; 12/30/20 41.62s w elbow on table. upgraded to continue to target fine motor coordination     OT LONG TERM GOAL #5   Title Pt will improve shoulder flexion to at least 100 degrees in order to obtain object and increase functional reach.    Baseline LUE 66 degrees sh flexion    Time 8    Period Weeks    Status On-going   70* on 12/30/2020 - continue working on LUE shoulder flexion and reach     OT LONG TERM GOAL #6   Title Pt will report decrease in pain in LUE shoulder at night to less than or equal to 4/10 with improved sleep positioning and range of motion    Time 8    Period Weeks    Status Achieved                 Plan - 01/18/21 1459    Clinical Impression Statement Pt with improved use of LUE this day. Pt encouraged to continue to use LUE with more  functional tasks at home.    OT Occupational Profile and History Problem Focused Assessment - Including review of records relating to presenting problem    Occupational performance deficits (Please refer to evaluation for details): ADL's;IADL's    Body Structure / Function / Physical Skills ADL;Balance;Coordination;IADL;Pain;GMC;Hearing;Strength;Tone;Sensation;Dexterity;Decreased  knowledge of use of DME;FMC;UE functional use;ROM;Flexibility    Cognitive Skills Attention;Sequencing;Problem Solve    OT Frequency 2x / week    OT Duration 4 weeks   renewal completed for 2x/week for 4 weeks   OT Treatment/Interventions Self-care/ADL training;Cryotherapy;Ultrasound;Moist Heat;Electrical Stimulation;Paraffin;Energy conservation;DME and/or AE instruction;Manual Therapy;Functional Mobility Training;Neuromuscular education;Therapeutic exercise;Visual/perceptual remediation/compensation;Patient/family education;Balance training;Therapeutic activities;Passive range of motion;Cognitive remediation/compensation    Plan work on using LUE with more funtional tasks, rewnewal next week    Consulted and Agree with Plan of Care Family member/caregiver;Patient    Family Member Consulted caregiever Pam           Patient will benefit from skilled therapeutic intervention in order to improve the following deficits and impairments:   Body Structure / Function / Physical Skills: ADL,Balance,Coordination,IADL,Pain,GMC,Hearing,Strength,Tone,Sensation,Dexterity,Decreased knowledge of use of DME,FMC,UE functional use,ROM,Flexibility Cognitive Skills: Attention,Sequencing,Problem Solve     Visit Diagnosis: Hemiplegia and hemiparesis following cerebral infarction affecting left non-dominant side (HCC)  Stiffness of left shoulder, not elsewhere classified  Other lack of coordination  Acute pain of left shoulder  Muscle weakness (generalized)    Problem List Patient Active Problem List   Diagnosis Date Noted  . Spasticity 11/30/2020  . Wheelchair dependence 10/07/2020  . Left knee pain 07/26/2020  . Cerebrovascular accident (CVA) of right basal ganglia (HCC) 07/26/2020  . Left hemiparesis (HCC) 07/25/2020  . Hypokalemia 07/20/2020  . Right basal ganglia embolic stroke (HCC) 07/20/2020  . Essential hypertension 07/20/2020  . Hypothyroidism 07/20/2020    Junious Dresser MOT,  OTR/L  01/18/2021, 3:04 PM  [facility] [address] [place], [place], [address] Phone: [phone]   Fax:  [phone]  Name: Nicole [name] MRN: [id number] Date of Birth: 1929/12/31

## 2021-01-18 NOTE — Therapy (Signed)
[facility] [address] [place], [place], [address] Phone: [phone]   Fax:  [phone]  Physical Therapy Treatment  Patient Details  Name: Nicole [name] MRN: [id number] Date of Birth: Jul 21, 1930 Referring Provider (PT): Courtney Heys, MD   Encounter Date: 01/18/2021   PT End of Session - 01/18/21 1202    Visit Number 24    Number of Visits 25    Date for PT Re-Evaluation 02/10/21   POC for 6 weeks, Cert for 60 days   Authorization Type BCBS Medicare    Progress Note Due on Visit 29   (Progress Note completed on 19th visit)   PT Start Time 1030   pt late to session   PT Stop Time 1100    PT Time Calculation (min) 30 min    Equipment Utilized During Treatment Gait belt    Activity Tolerance Patient tolerated treatment well    Behavior During Therapy [facility] for tasks assessed/performed           Past Medical History:  Diagnosis Date  . Hypertension   . Stroke [facility])    September 2021  . Thyroid disease     History reviewed. No pertinent surgical history.  There were no vitals filed for this visit.   Subjective Assessment - 01/18/21 1033    Subjective Patient late to appt today, still wishing to proceed. Has been trying to do a little more walking at home.    Patient is accompained by: Family member   pt's daughter in lobby   Pertinent History R Basal Ganglia CVA, Hyperlipidemia, Hypothyroidism, Asthma, Glaucoma, HTN    Limitations Standing;Walking    Patient Stated Goals Be able to walk;    Currently in Pain? No/denies              [facility] PT Assessment - 01/18/21 1057      Timed Up and Go Test   Normal TUG (seconds) 44.68   with RW                        [facility] Adult PT Treatment/Exercise - 01/18/21 0001      Transfers   Transfers Sit to Stand;Stand to Sit    Sit to Stand 4: Min guard;With upper extremity assist    Five time sit to stand comments  22.04 seconds with UE support from  mat at standard chair height    Stand to Sit 4: Min guard;With upper extremity assist;Uncontrolled descent    Stand to Sit Details pt with B genu valgum and RLE staggered posteriorly      Ambulation/Gait   Ambulation/Gait Yes    Ambulation/Gait Assistance 5: Supervision;4: Min guard    Ambulation/Gait Assistance Details cues for incr L hip/knee flexion to help with clearing LLE and foot placement    Ambulation Distance (Feet) 230 Feet    Assistive device Rolling walker    Gait Pattern Step-to pattern;Step-through pattern;Decreased step length - right;Decreased step length - left;Decreased stance time - left;Decreased hip/knee flexion - left;Decreased stride length;Decreased weight shift to left;Left flexed knee in stance;Antalgic;Trunk flexed;Narrow base of support    Ambulation Surface Level;Indoor    Gait velocity 45.81 seconds = .72 ft/sec with RW                  PT Education - 01/18/21 1200    Education Details continued to educate on importance of walking at home (pt reports she has been incr her  walking at home since last therapy visit), due to progress will re-cert for an additional 2x week for 4 weeks, progress towards goals    Person(s) Educated Patient   caregiver   Methods Explanation    Comprehension Verbalized understanding            PT Short Term Goals - 12/30/20 1022      PT SHORT TERM GOAL #1   Title Patient/family member will report complteing household ambulation with RW at least 2x/day (All STGs Due: 01/02/21)    Baseline currently ambulating multiple times daily per patient reports, daughter not present today    Time 3    Period Weeks    Status Achieved    Target Date 01/02/21      PT SHORT TERM GOAL #2   Title Patient will improve 5x sit <> stand to </= 30 seconds with UE support to demonstrate improved balance    Baseline 35.97 secs; 28.56 secs with UE support    Time 3    Period Weeks    Status Achieved      PT SHORT TERM GOAL #3   Title  Patient will improve TUG to </= to 68 seconds to demonstrate improved balance and reduced fall risk    Baseline 73.41 secs; 53.21 secs    Time 3    Period Weeks    Status Achieved             PT Long Term Goals - 01/18/21 1047      PT LONG TERM GOAL #1   Title Patient will be independent with final HEP with caregiver assistance, and report compliance with daily walking within the home with assistance (ALL LTGs Due: 01/23/21)    Baseline 12/09/20: current program was reviewed and updated this session, will benefit from further program advancement as pt progresses    Time 6    Period Weeks    Status Revised      PT LONG TERM GOAL #2   Title Patient will be able to ambulate >/= 200 ft on indoor level surfaces with CGA and LRAD to demonstrate improved household mobility    Baseline 01/18/21: 230' with RW and CGA    Time 6    Period Weeks    Status Achieved      PT LONG TERM GOAL #3   Title Patient will completed 5x sit <> stand in </= 25 seconds to demonstrate reduced fall risk and improved balance    Baseline 35.97 secs, 22.04 seconds on 01/18/21 with UE support.    Time 6    Period Weeks    Status Achieved      PT LONG TERM GOAL #4   Title Patient will improve TUG to </= 60 seconds to demonstrate reduced fall risk and improved balance    Baseline T2/4/22: 73.41 sec's with RW/brace on left LE, 44.68 seconds with RW on 01/18/21    Time 6    Period Weeks    Status Achieved      PT LONG TERM GOAL #5   Title Patient will improve gait speed to >/= 0.80 ft/sec to demonstrate improved tolerance for household mobility    Baseline 0.58 ft/sec, 45.81 seconds = .72 ft/sec with RW    Time 6    Period Weeks    Status Not Met               Plan - 01/18/21 1208    Clinical Impression Statement Today's skilled session focused on  assessing LTGs. Pt met LTG #2-4. Able to progress gait distance with RW and L AFO to 230' with CGA. Pt improved 5x sit <> stand to 22.04 seconds with UE  support (previously 35.97 seconds). Pt improved TUG with RW to 44.68 seconds (previously 73.41 seconds). Pt improved gait speed to .72 ft/sec with RW, but not to goal level, still indicating pt is a household ambulator and at an incr risk for falls. Pt has been walking more at home since last PT sessions. Continued to educate of importance of ambulating at home. Due to progress, will continue therapy for an additional 2x week for 4 weeks to continue to work on gait training, balance, strength, and stairs in order to improve functional mobility independence and decr fall risk. Will check remaining of LTGs at next session and will re-cert.    Personal Factors and Comorbidities Comorbidity 3+;Time since onset of injury/illness/exacerbation;Age    Comorbidities R Basal Ganglia CVA, Hyperlipidemia, Hypothyroidism, Asthma, Glaucoma, HTN    Examination-Activity Limitations Bed Mobility;Dressing;Locomotion Level;Reach Overhead;Stairs;Stand;Transfers    Examination-Participation Restrictions Armed forces logistics/support/administrative officer Evolving/Moderate complexity    Rehab Potential Fair    PT Frequency 2x / week    PT Duration 6 weeks    PT Treatment/Interventions ADLs/Self Care Home Management;Cryotherapy;Electrical Stimulation;Moist Heat;DME Instruction;Gait training;Stair training;Functional mobility training;Therapeutic activities;Therapeutic exercise;Balance training;Neuromuscular re-education;Patient/family education;Orthotic Fit/Training;Manual techniques;Passive range of motion    PT Next Visit Plan check remainder of LTGs for recert. review and update HEP. recert for 2x wk for 4 weeks, also need a stair goal.    PT Home Exercise Plan Access Code: [email or web address]    Consulted and Agree with Plan of Care Patient           Patient will benefit from skilled therapeutic intervention in order to improve the following deficits and impairments:  Abnormal gait,Decreased balance,Decreased  mobility,Decreased endurance,Difficulty walking,Impaired tone,Impaired sensation,Pain,Decreased strength,Decreased safety awareness,Decreased knowledge of use of DME,Decreased coordination,Decreased activity tolerance,Decreased range of motion  Visit Diagnosis: Hemiplegia and hemiparesis following cerebral infarction affecting left non-dominant side (HCC)  Other lack of coordination  Muscle weakness (generalized)  Other abnormalities of gait and mobility  Unsteadiness on feet     Problem List Patient Active Problem List   Diagnosis Date Noted  . Spasticity 11/30/2020  . Wheelchair dependence 10/07/2020  . Left knee pain 07/26/2020  . Cerebrovascular accident (CVA) of right basal ganglia ([place]) 07/26/2020  . Left hemiparesis ([place]) 07/25/2020  . Hypokalemia 07/20/2020  . Right basal ganglia embolic stroke ([place]) 14/23/9532  . Essential hypertension 07/20/2020  . Hypothyroidism 07/20/2020    Arliss Journey, PT, DPT  01/18/2021, 12:08 PM  [facility] [address] [place], [place], [address] Phone: [phone]   Fax:  [phone]  Name: Nicole [name] MRN: [id number] Date of Birth: [birth year]

## 2021-01-20 ENCOUNTER — Ambulatory Visit: Payer: Medicare Other | Admitting: Physical Therapy

## 2021-01-20 ENCOUNTER — Encounter: Payer: Self-pay | Admitting: Physical Therapy

## 2021-01-20 ENCOUNTER — Ambulatory Visit: Payer: Medicare Other | Admitting: Occupational Therapy

## 2021-01-20 ENCOUNTER — Encounter: Payer: Self-pay | Admitting: Occupational Therapy

## 2021-01-20 ENCOUNTER — Other Ambulatory Visit: Payer: Self-pay

## 2021-01-20 DIAGNOSIS — R262 Difficulty in walking, not elsewhere classified: Secondary | ICD-10-CM | POA: Diagnosis not present

## 2021-01-20 DIAGNOSIS — I69354 Hemiplegia and hemiparesis following cerebral infarction affecting left non-dominant side: Secondary | ICD-10-CM

## 2021-01-20 DIAGNOSIS — R278 Other lack of coordination: Secondary | ICD-10-CM | POA: Diagnosis not present

## 2021-01-20 DIAGNOSIS — R2689 Other abnormalities of gait and mobility: Secondary | ICD-10-CM | POA: Diagnosis not present

## 2021-01-20 DIAGNOSIS — M25512 Pain in left shoulder: Secondary | ICD-10-CM | POA: Diagnosis not present

## 2021-01-20 DIAGNOSIS — M25612 Stiffness of left shoulder, not elsewhere classified: Secondary | ICD-10-CM | POA: Diagnosis not present

## 2021-01-20 DIAGNOSIS — R2681 Unsteadiness on feet: Secondary | ICD-10-CM | POA: Diagnosis not present

## 2021-01-20 DIAGNOSIS — M6281 Muscle weakness (generalized): Secondary | ICD-10-CM

## 2021-01-20 NOTE — Patient Instructions (Signed)
Access Code: [email or web address] URL: [email or web address] Date: 01/20/2021 Prepared by: Sallyanne Kuster  Exercises Seated Hip Abduction with Resistance - 1 x daily - 5 x weekly - 2 sets - 10 reps Seated March with Resistance - 1 x daily - 5 x weekly - 1 sets - 10 reps Sit to Stand with Armchair - 1 x daily - 5 x weekly - 1 sets - 10 reps Standing March with Counter Support - 1 x daily - 5 x weekly - 1 sets - 10 reps Standing Hip Abduction with Counter Support - 1 x daily - 5 x weekly - 1 sets - 3-5 reps Mini Squat with Counter Support - 1 x daily - 5 x weekly - 1 sets - 10 reps

## 2021-01-20 NOTE — Therapy (Addendum)
[facility] [address] [place], [place], [address] Phone: [phone]   Fax:  [phone]  Physical Therapy Treatment/Re-Certification  Patient Details  Name: Nicole [name] MRN: [id number] Date of Birth: October 29, 1930 Referring Provider (PT): Courtney Heys, MD   Encounter Date: 01/20/2021   PT End of Session - 01/20/21 0937    Visit Number 25    Number of Visits 33    Date for PT Re-Evaluation 03/21/21   updated POC for 4 weeks, Cert for 60 days   Authorization Type BCBS Medicare    Progress Note Due on Visit 29   (Progress Note completed on 19th visit)   PT Start Time 0933    PT Stop Time 1015    PT Time Calculation (min) 42 min    Equipment Utilized During Treatment Gait belt    Activity Tolerance Patient tolerated treatment well    Behavior During Therapy WFL for tasks assessed/performed           Past Medical History:  Diagnosis Date  . Hypertension   . Stroke [facility])    September 2021  . Thyroid disease     History reviewed. No pertinent surgical history.  There were no vitals filed for this visit.   Subjective Assessment - 01/20/21 0936    Subjective No new complaitns. No falls. Reports she walked up her stairs with Pam after last session. Did not walk back down, scooted on her bottom.    Patient is accompained by: Family member   Pam, CNA   Pertinent History R Basal Ganglia CVA, Hyperlipidemia, Hypothyroidism, Asthma, Glaucoma, HTN    Limitations Standing;Walking    Patient Stated Goals Be able to walk;    Currently in Pain? No/denies             [facility] PT Assessment - 01/20/21 0947      Assessment   Medical Diagnosis R Basal Ganglia CVA    Referring Provider (PT) Courtney Heys, MD             [facility] Adult PT Treatment/Exercise - 01/20/21 0947      Transfers   Transfers Sit to Stand;Stand to Sit;Stand Pivot Transfers    Sit to Stand 4: Min guard;With upper extremity assist    Stand to Sit  4: Min guard;With upper extremity assist;Uncontrolled descent    Stand Pivot Transfers 5: Supervision    Stand Pivot Transfer Details (indicate cue type and reason) with RW from wheelchair to mat table      Ambulation/Gait   Ambulation/Gait Yes    Ambulation/Gait Assistance 5: Supervision;4: Min guard    Ambulation/Gait Assistance Details cues for left heel strike with gait and for increased hip/knee flexion with swing phase for improved foot clearance. Pt noted to have increased step length/gait speed as compared with previous sessions.    Ambulation Distance (Feet) 230 Feet   x1   Assistive device Rolling walker    Gait Pattern Step-to pattern;Step-through pattern;Decreased step length - right;Decreased step length - left;Decreased stance time - left;Decreased hip/knee flexion - left;Decreased stride length;Decreased weight shift to left;Left flexed knee in stance;Antalgic;Trunk flexed;Narrow base of support    Ambulation Surface Level;Indoor      Exercises   Exercises Other Exercises    Other Exercises  reviewed current HEP and made advancements. Refer to [place] program for full details.          Issued to HEP this session:   Access Code: [email or web address] URL: [email or web address] Date: 01/20/2021  Prepared by: Willow Ora  Exercises Seated Hip Abduction with Resistance - 1 x daily - 5 x weekly - 2 sets - 10 reps Seated March with Resistance - 1 x daily - 5 x weekly - 1 sets - 10 reps Sit to Stand with Armchair - 1 x daily - 5 x weekly - 1 sets - 10 reps Standing March with Counter Support - 1 x daily - 5 x weekly - 1 sets - 10 reps Standing Hip Abduction with Counter Support - 1 x daily - 5 x weekly - 1 sets - 3-5 reps Mini Squat with Counter Support - 1 x daily - 5 x weekly - 1 sets - 10 reps       PT Education - 01/20/21 1322    Education Details updated HEP    Person(s) Educated Patient;Other (comment)   Caregiver Pam   Methods Explanation;Demonstration;Verbal  cues;Handout    Comprehension Verbalized understanding;Returned demonstration;Verbal cues required;Need further instruction            PT Short Term Goals - 12/30/20 1022      PT SHORT TERM GOAL #1   Title Patient/family member will report complteing household ambulation with RW at least 2x/day (All STGs Due: 01/02/21)    Baseline currently ambulating multiple times daily per patient reports, daughter not present today    Time 3    Period Weeks    Status Achieved    Target Date 01/02/21      PT SHORT TERM GOAL #2   Title Patient will improve 5x sit <> stand to </= 30 seconds with UE support to demonstrate improved balance    Baseline 35.97 secs; 28.56 secs with UE support    Time 3    Period Weeks    Status Achieved      PT SHORT TERM GOAL #3   Title Patient will improve TUG to </= to 68 seconds to demonstrate improved balance and reduced fall risk    Baseline 73.41 secs; 53.21 secs    Time 3    Period Weeks    Status Achieved             PT Long Term Goals - 01/20/21 1323      PT LONG TERM GOAL #1   Title Patient will be independent with final HEP with caregiver assistance, and report compliance with daily walking within the home with assistance (ALL LTGs Due: 01/23/21)    Baseline 01/20/21: met with current HEP, updated/advanced the HEP this session    Status Achieved      PT LONG TERM GOAL #2   Title Patient will be able to ambulate >/= 200 ft on indoor level surfaces with CGA and LRAD to demonstrate improved household mobility    Baseline 01/18/21: 230' with RW and CGA    Status Achieved      PT LONG TERM GOAL #3   Title Patient will completed 5x sit <> stand in </= 25 seconds to demonstrate reduced fall risk and improved balance    Baseline 01/18/21: 22.04 sec's with UE support.    Status Achieved      PT LONG TERM GOAL #4   Title Patient will improve TUG to </= 60 seconds to demonstrate reduced fall risk and improved balance    Baseline 01/18/21: 44.68 sec's wth  RW/brace on left LE    Status Achieved      PT LONG TERM GOAL #5   Title Patient will improve gait speed to >/=  0.80 ft/sec to demonstrate improved tolerance for household mobility    Baseline 01/18/21: 45.81 seconds = .72 ft/sec with RW, improved from 0.58 ft/sec just not to goal level    Status Not Met          Updated Short Term Goals:   PT Short Term Goals - 01/20/21 1423      PT SHORT TERM GOAL #1   Title = LTGs           Updated Long Term Goals:     PT Long Term Goals - 01/20/21 1424      PT LONG TERM GOAL #1   Title Patient will be independent with progressive final HEP with caregiver assistance, and report compliance with daily walking within the home with assistance (ALL LTGs Due: 02/17/21)    Baseline patient continues to benefit from progressive HEP    Time 4    Period Weeks    Status Revised    Target Date 02/17/21      PT LONG TERM GOAL #2   Title Patient will be able to ambulate >/= 200 ft on indoor level surfaces with supervision and LRAD to demonstrate improved household mobility    Baseline 01/18/21: 230' with RW and CGA    Time 4    Period Weeks    Status Revised      PT LONG TERM GOAL #3   Title Patient will completed 5x sit <> stand in </= 15 seconds to demonstrate reduced fall risk and improved balance    Baseline 01/18/21: 22.04 sec's with UE support.    Time 4    Period Weeks    Status Revised      PT LONG TERM GOAL #4   Title Patient will improve TUG to </= 30 seconds to demonstrate reduced fall risk and improved balance    Baseline 01/18/21: 44.68 sec's wth RW/brace on left LE    Time 4    Period Weeks    Status Revised      PT LONG TERM GOAL #5   Title Patient will improve gait speed to >/= 0.80 ft/sec to demonstrate improved tolerance for household mobility    Baseline 01/18/21: 45.81 seconds = .72 ft/sec with RW, improved from 0.58 ft/sec just not to goal level    Time 4    Status On-going      Additional Long Term Goals   Additional  Long Term Goals Yes      PT LONG TERM GOAL #6   Title Patient will be able to ascend/descend x 12 stairs with B rails and CGA to demonstrate improved safety with household mobiltity    Baseline CGA to Min A with stair negotiation    Time 4    Period Weeks    Status New              Plan - 01/20/21 7867    Clinical Impression Statement Today's skilled session intiially address remaining LTG. Pt's HEP was reviewed and advancements made this session. No issues noted or reported with performance of new ex's in session today except for left LE fatigued quickly iwht standing ex's. Due to this decreased the reps performed at home for safety. Remainder of session continued to address gait with RW/brace with emphasis on increased hip/knee flexion and heel strike with swing phase with some improvement noted. The pt is making steady progress toward goals and should benefit from continued PT to progress toward unmet goals.    Personal Factors  and Comorbidities Comorbidity 3+;Time since onset of injury/illness/exacerbation;Age    Comorbidities R Basal Ganglia CVA, Hyperlipidemia, Hypothyroidism, Asthma, Glaucoma, HTN    Examination-Activity Limitations Bed Mobility;Dressing;Locomotion Level;Reach Overhead;Stairs;Stand;Transfers    Examination-Participation Restrictions Armed forces logistics/support/administrative officer Evolving/Moderate complexity    Rehab Potential Fair    PT Frequency 2x / week    PT Duration 4 weeks    PT Treatment/Interventions ADLs/Self Care Home Management;Cryotherapy;Electrical Stimulation;Moist Heat;DME Instruction;Gait training;Stair training;Functional mobility training;Therapeutic activities;Therapeutic exercise;Balance training;Neuromuscular re-education;Patient/family education;Orthotic Fit/Training;Manual techniques;Passive range of motion    PT Next Visit Plan primary PT to complete recert; continued to work on gait with RW, stairs and standing  balance/strengthening.    PT Home Exercise Plan Access Code: [email or web address]    Consulted and Agree with Plan of Care Patient           Patient will benefit from skilled therapeutic intervention in order to improve the following deficits and impairments:  Abnormal gait,Decreased balance,Decreased mobility,Decreased endurance,Difficulty walking,Impaired tone,Impaired sensation,Pain,Decreased strength,Decreased safety awareness,Decreased knowledge of use of DME,Decreased coordination,Decreased activity tolerance,Decreased range of motion  Visit Diagnosis: Hemiplegia and hemiparesis following cerebral infarction affecting left non-dominant side (HCC)  Muscle weakness (generalized)  Unsteadiness on feet  Other abnormalities of gait and mobility  Difficulty in walking, not elsewhere classified     Problem List Patient Active Problem List   Diagnosis Date Noted  . Spasticity 11/30/2020  . Wheelchair dependence 10/07/2020  . Left knee pain 07/26/2020  . Cerebrovascular accident (CVA) of right basal ganglia ([place]) 07/26/2020  . Left hemiparesis ([place]) 07/25/2020  . Hypokalemia 07/20/2020  . Right basal ganglia embolic stroke ([place]) 82/06/1387  . Essential hypertension 07/20/2020  . Hypothyroidism 07/20/2020    Willow Ora, PTA, [facility] [address], [place], [place] [address] [phone] 01/20/21, 2:22 PM   Addendum by Primary Physical Therapist: Charlotte Sanes, PT, DPT   Name: LIZANNE ERKER MRN: [id number] Date of Birth: Mar 28, 1930

## 2021-01-20 NOTE — Therapy (Signed)
[facility] [address] [place], [place], [address] Phone: [phone]   Fax:  [phone]  Occupational Therapy Treatment  Patient Details  Name: Nicole [name] MRN: [id number] Date of Birth: 11/27/1929 Referring Provider (OT): Genice Rouge, MD   Encounter Date: 01/20/2021   OT End of Session - 01/20/21 1019    Visit Number 21    Number of Visits 23    Date for OT Re-Evaluation 01/27/21    Authorization Type BCBS Medicare - WEEK 3/4 (as of 01/18/21)    Authorization Time Period $40 Copay VL:MN No Auth required  10th visit PN     Authorization - Visit Number 21    Authorization - Number of Visits 23    Progress Note Due on Visit 25    OT Start Time 1017    OT Stop Time 1100    OT Time Calculation (min) 43 min    Activity Tolerance Patient tolerated treatment well    Behavior During Therapy WFL for tasks assessed/performed           Past Medical History:  Diagnosis Date  . Hypertension   . Stroke [facility])    September 2021  . Thyroid disease     History reviewed. No pertinent surgical history.  There were no vitals filed for this visit.   Subjective Assessment - 01/20/21 1020    Subjective  "i'm doing my exercises"    Patient is accompanied by: Family member    Pertinent History CVA R basal ganglia, HTN, HLD, hypothyroidism, asthma, glaucoma    Currently in Pain? No/denies           Treatment  Seated - bringing arm/hand to walker. Pt required mod tactile, visual and verbal cues for reducing shoulder hiking and compensatory movements. Pt correct self and reduced hiking to very minimal LUE  Supine Exercises- external rotation/butterfly stretch x 2  Supine Exercises - with foam roller. X 10 repetitions. Shoulder flexion, horizontal abduction, external rotation, chest press  Low Level Reaching - reaching to floor for cones and reaching to hand to OT at varying heights.                           OT Short Term Goals - 12/30/20 0933      OT SHORT TERM GOAL #1   Title Pt will be independent with HEP (12/07/20)    Time 4    Period Weeks    Status Achieved    Target Date 12/07/20      OT SHORT TERM GOAL #2   Title Pt will improve grip strength in LUE by 3 lbs or greater for increase in functional use of LUE and increase independence with clothing management.    Baseline RUE 42.1 LUE 26.8    Time 4    Period Weeks    Status Achieved   37.9 LUE     OT SHORT TERM GOAL #3   Title Pt will obtain item from mid level shelf with increase in shoulder flexion to 70 degrees with good positioning and reduction in shoulder hiking and compensatory movements.    Baseline LUE 66 degrees sh flexion    Time 4    Period Weeks    Status Achieved   90* on 12/23/2020     OT SHORT TERM GOAL #4   Title Pt will demonstrate improved box and blocks score to 35 or greater with LUE for increase in  functional use of LUE.    Baseline 29 LUE    Time 4    Period Weeks    Status On-going   12/30/20 - 33 blocks with significant shoulder hiking on LUE     OT SHORT TERM GOAL #5   Title Pt will verbalize understanding of sleep positions for LUE to decrease pain.    Baseline 6/10 LUE shoulder pain at night    Time 4    Period Weeks    Status Achieved      OT SHORT TERM GOAL #6   Title Pt will don bra with mod I    Baseline daughter currently assisting    Time 4    Period Weeks    Status Achieved   12/07/20 - pt reports donning bra all clothing            OT Long Term Goals - 12/30/20 0940      OT LONG TERM GOAL #1   Title Pt will be independent with updated HEP 01/04/2021    Time 8    Period Weeks    Status On-going   pt completing shoulder HEPs - ongoing to update PRN     OT LONG TERM GOAL #2   Title Pt will demonstrate increased grip strength in LUE to 33 lbs or greater for improved functional use. UPDATE: 37 lbs or greater    Baseline RUE 42.1 LUE  26.8    Time 8    Period Weeks    Status Revised   33.7 lbs on 12/30/2020 - upgraded goal for continuing to work on increasing grip strength     OT LONG TERM GOAL #3   Title Pt will perform UB and LB dressing with mod I.    Time 8    Period Weeks    Status Achieved   pt and caregiver report pt is doing all dressing except shower days     OT LONG TERM GOAL #4   Title Pt will increase fine motor coordination by completing 9 hole peg test in 50 seconds or less with LUE. Upgraded 12/23/2020: 45seconds or less with LUE UPDATE: 38s or less with L elbow on table    Baseline RUE 27.47 LUE 61.87    Time 8    Period Weeks    Status Revised   12/23/2020: 50.19s ; 12/30/20 41.62s w elbow on table. upgraded to continue to target fine motor coordination     OT LONG TERM GOAL #5   Title Pt will improve shoulder flexion to at least 100 degrees in order to obtain object and increase functional reach.    Baseline LUE 66 degrees sh flexion    Time 8    Period Weeks    Status On-going   41* on 12/30/2020 - continue working on LUE shoulder flexion and reach     OT LONG TERM GOAL #6   Title Pt will report decrease in pain in LUE shoulder at night to less than or equal to 4/10 with improved sleep positioning and range of motion    Time 8    Period Weeks    Status Achieved                 Plan - 01/20/21 1137    Clinical Impression Statement Pt encouraged to continue to use LUE with more functional tasks at home.    OT Occupational Profile and History Problem Focused Assessment - Including review of records relating to presenting problem  Occupational performance deficits (Please refer to evaluation for details): ADL's;IADL's    Body Structure / Function / Physical Skills ADL;Balance;Coordination;IADL;Pain;GMC;Hearing;Strength;Tone;Sensation;Dexterity;Decreased knowledge of use of DME;FMC;UE functional use;ROM;Flexibility    Cognitive Skills Attention;Sequencing;Problem Solve    OT Frequency 2x /  week    OT Duration 4 weeks   renewal completed for 2x/week for 4 weeks   OT Treatment/Interventions Self-care/ADL training;Cryotherapy;Ultrasound;Moist Heat;Electrical Stimulation;Paraffin;Energy conservation;DME and/or AE instruction;Manual Therapy;Functional Mobility Training;Neuromuscular education;Therapeutic exercise;Visual/perceptual remediation/compensation;Patient/family education;Balance training;Therapeutic activities;Passive range of motion;Cognitive remediation/compensation    Plan work on using LUE with more funtional tasks, rewnewal next week    Consulted and Agree with Plan of Care Family member/caregiver;Patient    Family Member Consulted caregiever Pam           Patient will benefit from skilled therapeutic intervention in order to improve the following deficits and impairments:   Body Structure / Function / Physical Skills: ADL,Balance,Coordination,IADL,Pain,GMC,Hearing,Strength,Tone,Sensation,Dexterity,Decreased knowledge of use of DME,FMC,UE functional use,ROM,Flexibility Cognitive Skills: Attention,Sequencing,Problem Solve     Visit Diagnosis: Hemiplegia and hemiparesis following cerebral infarction affecting left non-dominant side (HCC)  Stiffness of left shoulder, not elsewhere classified  Other lack of coordination  Acute pain of left shoulder  Muscle weakness (generalized)    Problem List Patient Active Problem List   Diagnosis Date Noted  . Spasticity 11/30/2020  . Wheelchair dependence 10/07/2020  . Left knee pain 07/26/2020  . Cerebrovascular accident (CVA) of right basal ganglia (HCC) 07/26/2020  . Left hemiparesis (HCC) 07/25/2020  . Hypokalemia 07/20/2020  . Right basal ganglia embolic stroke (HCC) 07/20/2020  . Essential hypertension 07/20/2020  . Hypothyroidism 07/20/2020    Junious Dresser MOT, OTR/L  01/20/2021, 11:38 AM  [facility] [address] [place], [place],  [address] Phone: [phone]   Fax:  [phone]  Name: Nicole [name] MRN: [id number] Date of Birth: 1930/10/23

## 2021-01-20 NOTE — Addendum Note (Signed)
Addended by: Jethro Bastos B on: 01/20/2021 02:49 PM   Modules accepted: Orders

## 2021-01-25 ENCOUNTER — Ambulatory Visit: Payer: Medicare Other | Admitting: Physical Therapy

## 2021-01-25 ENCOUNTER — Encounter: Payer: Self-pay | Admitting: Occupational Therapy

## 2021-01-25 ENCOUNTER — Encounter: Payer: Self-pay | Admitting: Physical Therapy

## 2021-01-25 ENCOUNTER — Other Ambulatory Visit: Payer: Self-pay

## 2021-01-25 ENCOUNTER — Ambulatory Visit: Payer: Medicare Other | Admitting: Occupational Therapy

## 2021-01-25 DIAGNOSIS — M6281 Muscle weakness (generalized): Secondary | ICD-10-CM

## 2021-01-25 DIAGNOSIS — M25512 Pain in left shoulder: Secondary | ICD-10-CM

## 2021-01-25 DIAGNOSIS — R2681 Unsteadiness on feet: Secondary | ICD-10-CM

## 2021-01-25 DIAGNOSIS — R262 Difficulty in walking, not elsewhere classified: Secondary | ICD-10-CM | POA: Diagnosis not present

## 2021-01-25 DIAGNOSIS — I69354 Hemiplegia and hemiparesis following cerebral infarction affecting left non-dominant side: Secondary | ICD-10-CM | POA: Diagnosis not present

## 2021-01-25 DIAGNOSIS — R2689 Other abnormalities of gait and mobility: Secondary | ICD-10-CM

## 2021-01-25 DIAGNOSIS — R278 Other lack of coordination: Secondary | ICD-10-CM

## 2021-01-25 DIAGNOSIS — M25612 Stiffness of left shoulder, not elsewhere classified: Secondary | ICD-10-CM

## 2021-01-25 NOTE — Therapy (Signed)
[facility] [address] [place], [place], [address] Phone: [phone]   Fax:  [phone]  Occupational Therapy Treatment  Patient Details  Name: Nicole [name] MRN: [id number] Date of Birth: 1929-12-18 Referring Provider (OT): Courtney Heys, MD   Encounter Date: 01/25/2021   OT End of Session - 01/25/21 1018    Visit Number 22    Number of Visits 23    Date for OT Re-Evaluation 01/27/21    Authorization Type BCBS Medicare - WEEK 4/4 (as of 01/25/21)    Authorization Time Period $40 Copay VL:MN No Auth required  10th visit PN     Authorization - Visit Number 36    Authorization - Number of Visits 23    Progress Note Due on Visit 25    OT Start Time 1018    OT Stop Time 1100    OT Time Calculation (min) 42 min    Activity Tolerance Patient tolerated treatment well    Behavior During Therapy WFL for tasks assessed/performed           Past Medical History:  Diagnosis Date  . Hypertension   . Stroke [facility])    September 2021  . Thyroid disease     History reviewed. No pertinent surgical history.  There were no vitals filed for this visit.   Subjective Assessment - 01/25/21 1018    Subjective  Pt denies any pain.    Patient is accompanied by: Family member    Pertinent History CVA R basal ganglia, HTN, HLD, hypothyroidism, asthma, glaucoma    Currently in Pain? No/denies             TREATMENT:  Checked remaining LTGs for renewal this week. See below. Pt met all STGs and Pt has met 4/6 LTGs.  Small Pegs: with RUE at tabletop for coordination and copying pattern. Pt required mod tactile and verbal cues for reducing lateral flexion to right for compensatory strategies. Pt with 100% accuracy following pattern.  Table Slides x 10 forward reaching  Flipping Cards:  With LUE for increase in coordination. Pt with difficulty with flipping cards and imitating movements of  therapist.                   OT Short Term Goals - 01/25/21 1050      OT SHORT TERM GOAL #1   Title Pt will be independent with HEP (12/07/20)    Time 4    Period Weeks    Status Achieved    Target Date 12/07/20      OT SHORT TERM GOAL #2   Title Pt will improve grip strength in LUE by 3 lbs or greater for increase in functional use of LUE and increase independence with clothing management.    Baseline RUE 42.1 LUE 26.8    Time 4    Period Weeks    Status Achieved   37.9 LUE     OT SHORT TERM GOAL #3   Title Pt will obtain item from mid level shelf with increase in shoulder flexion to 70 degrees with good positioning and reduction in shoulder hiking and compensatory movements.    Baseline LUE 66 degrees sh flexion    Time 4    Period Weeks    Status Achieved   90* on 12/23/2020     OT SHORT TERM GOAL #4   Title Pt will demonstrate improved box and blocks score to 35 or greater with LUE for increase in  functional use of LUE.    Baseline 29 LUE    Time 4    Period Weeks    Status Achieved   01/25/21 35 blocks     OT SHORT TERM GOAL #5   Title Pt will verbalize understanding of sleep positions for LUE to decrease pain.    Baseline 6/10 LUE shoulder pain at night    Time 4    Period Weeks    Status Achieved      OT SHORT TERM GOAL #6   Title Pt will don bra with mod I    Baseline daughter currently assisting    Time 4    Period Weeks    Status Achieved   12/07/20 - pt reports donning bra all clothing            OT Long Term Goals - 01/25/21 1023      OT LONG TERM GOAL #1   Title Pt will be independent with updated HEP 01/04/2021    Time 8    Period Weeks    Status On-going   pt completing shoulder HEPs - ongoing to update PRN     OT LONG TERM GOAL #2   Title Pt will demonstrate increased grip strength in LUE to 33 lbs or greater for improved functional use. UPDATE: 37 lbs or greater    Baseline RUE 42.1 LUE 26.8    Time 8    Period Weeks    Status  Achieved   42.9 lbs on 01/25/21     OT LONG TERM GOAL #3   Title Pt will perform UB and LB dressing with mod I.    Time 8    Period Weeks    Status Achieved   pt and caregiver report pt is doing all dressing except shower days     OT LONG TERM GOAL #4   Title Pt will increase fine motor coordination by completing 9 hole peg test in 50 seconds or less with LUE. Upgraded 12/23/2020: 45seconds or less with LUE UPDATE: 38s or less with L elbow on table    Baseline RUE 27.47 LUE 61.87    Time 8    Period Weeks    Status On-going   01/25/21 40s with LUE     OT LONG TERM GOAL #5   Title Pt will improve shoulder flexion to at least 100 degrees in order to obtain object and increase functional reach.    Baseline LUE 66 degrees sh flexion    Time 8    Period Weeks    Status Achieved   110* on 01/25/21     OT LONG TERM GOAL #6   Title Pt will report decrease in pain in LUE shoulder at night to less than or equal to 4/10 with improved sleep positioning and range of motion    Time 8    Period Weeks    Status Achieved                 Plan - 01/25/21 1032    Clinical Impression Statement Pt has met 4/6 LTGs and all STGs.    OT Occupational Profile and History Problem Focused Assessment - Including review of records relating to presenting problem    Occupational performance deficits (Please refer to evaluation for details): ADL's;IADL's    Body Structure / Function / Physical Skills ADL;Balance;Coordination;IADL;Pain;GMC;Hearing;Strength;Tone;Sensation;Dexterity;Decreased knowledge of use of DME;FMC;UE functional use;ROM;Flexibility    Cognitive Skills Attention;Sequencing;Problem Solve    OT Frequency 2x / week  OT Duration 4 weeks   renewal completed for 2x/week for 4 weeks   OT Treatment/Interventions Self-care/ADL training;Cryotherapy;Ultrasound;Moist Heat;Electrical Stimulation;Paraffin;Energy conservation;DME and/or AE instruction;Manual Therapy;Functional Mobility  Training;Neuromuscular education;Therapeutic exercise;Visual/perceptual remediation/compensation;Patient/family education;Balance training;Therapeutic activities;Passive range of motion;Cognitive remediation/compensation    Plan renewal for 1x/week for 4 weeks    Consulted and Agree with Plan of Care Family member/caregiver;Patient    Family Member Consulted caregiever Pam           Patient will benefit from skilled therapeutic intervention in order to improve the following deficits and impairments:   Body Structure / Function / Physical Skills: ADL,Balance,Coordination,IADL,Pain,GMC,Hearing,Strength,Tone,Sensation,Dexterity,Decreased knowledge of use of DME,FMC,UE functional use,ROM,Flexibility Cognitive Skills: Attention,Sequencing,Problem Solve     Visit Diagnosis: Hemiplegia and hemiparesis following cerebral infarction affecting left non-dominant side (HCC)  Stiffness of left shoulder, not elsewhere classified  Other lack of coordination  Acute pain of left shoulder  Muscle weakness (generalized)  Unsteadiness on feet  Other abnormalities of gait and mobility    Problem List Patient Active Problem List   Diagnosis Date Noted  . Spasticity 11/30/2020  . Wheelchair dependence 10/07/2020  . Left knee pain 07/26/2020  . Cerebrovascular accident (CVA) of right basal ganglia ([place]) 07/26/2020  . Left hemiparesis ([place]) 07/25/2020  . Hypokalemia 07/20/2020  . Right basal ganglia embolic stroke ([place]) 29/19/1660  . Essential hypertension 07/20/2020  . Hypothyroidism 07/20/2020    Zachery Conch MOT, OTR/L  01/25/2021, 11:18 AM  [facility] [address] [place], [place], [address] Phone: [phone]   Fax:  [phone]  Name: Nicole [name] MRN: [id number] Date of Birth: September 06, 1930

## 2021-01-26 NOTE — Therapy (Signed)
[facility] [address] [place], [place], [address] Phone: [phone]   Fax:  [phone]  Physical Therapy Treatment  Patient Details  Name: Nicole [name] MRN: [id number] Date of Birth: 1929/11/17 Referring Provider (PT): Genice Rouge, MD   Encounter Date: 01/25/2021   PT End of Session - 01/25/21 0939    Visit Number 26    Number of Visits 33    Date for PT Re-Evaluation 03/21/21   updated POC for 4 weeks, Cert for 60 days   Authorization Type BCBS Medicare    Progress Note Due on Visit 29   (Progress Note completed on 19th visit)   PT Start Time 0936   pt running late for session today   PT Stop Time 1015    PT Time Calculation (min) 39 min    Equipment Utilized During Treatment Gait belt    Activity Tolerance Patient tolerated treatment well    Behavior During Therapy [facility] for tasks assessed/performed           Past Medical History:  Diagnosis Date  . Hypertension   . Stroke [facility])    September 2021  . Thyroid disease     History reviewed. No pertinent surgical history.  There were no vitals filed for this visit.   Subjective Assessment - 01/25/21 0938    Subjective No new complaints. No falls or pain to report. Has not climbed up the stairs again. Is walking around the house with RW/brace and assist from caregiver/family.    Patient is accompained by: Family member   caregiver Pam   Pertinent History R Basal Ganglia CVA, Hyperlipidemia, Hypothyroidism, Asthma, Glaucoma, HTN    Limitations Standing;Walking    Patient Stated Goals Be able to walk;    Currently in Pain? No/denies    Pain Score 0-No pain              [facility] Adult PT Treatment/Exercise - 01/25/21 0941      Transfers   Transfers Sit to Stand;Stand to Sit    Sit to Stand 4: Min guard;With upper extremity assist    Stand to Sit 4: Min guard;With upper extremity assist;Uncontrolled descent      Ambulation/Gait   Ambulation/Gait Yes     Ambulation/Gait Assistance 5: Supervision;4: Min guard    Ambulation/Gait Assistance Details occasional cues for posture. cues for increased heel strike on left LE and for increased right LE step length with gait. no balance issues noted.    Ambulation Distance (Feet) 230 Feet    Assistive device Rolling walker   left AFO   Gait Pattern Step-to pattern;Step-through pattern;Decreased step length - right;Decreased step length - left;Decreased stance time - left;Decreased hip/knee flexion - left;Decreased stride length;Decreased weight shift to left;Left flexed knee in stance;Antalgic;Trunk flexed;Narrow base of support    Ambulation Surface Level;Indoor      High Level Balance   High Level Balance Activities Side stepping    High Level Balance Comments on blue mat in parallel bars with light UE support for 2 laps each way.               Balance Exercises - 01/25/21 0951      Balance Exercises: Standing   Rockerboard Anterior/posterior;Lateral;Head turns;EO;EC;30 seconds;Other reps (comment);Limitations    Rockerboard Limitations performed both ways on balance board with no UE support: holding the board steady for EC 30 sec's x3 reps, progressing to EO for head movements left<>right, up<>down for 7-8 reps each. min to  mod assist for balance with cues on posture/weight shifitng to assist with balance.               PT Short Term Goals - 01/20/21 1423      PT SHORT TERM GOAL #1   Title = LTGs             PT Long Term Goals - 01/20/21 1424      PT LONG TERM GOAL #1   Title Patient will be independent with progressive final HEP with caregiver assistance, and report compliance with daily walking within the home with assistance (ALL LTGs Due: 02/17/21)    Baseline patient continues to benefit from progressive HEP    Time 4    Period Weeks    Status Revised    Target Date 02/17/21      PT LONG TERM GOAL #2   Title Patient will be able to ambulate >/= 200 ft on indoor  level surfaces with supervision and LRAD to demonstrate improved household mobility    Baseline 01/18/21: 230' with RW and CGA    Time 4    Period Weeks    Status Revised      PT LONG TERM GOAL #3   Title Patient will completed 5x sit <> stand in </= 15 seconds to demonstrate reduced fall risk and improved balance    Baseline 01/18/21: 22.04 sec's with UE support.    Time 4    Period Weeks    Status Revised      PT LONG TERM GOAL #4   Title Patient will improve TUG to </= 30 seconds to demonstrate reduced fall risk and improved balance    Baseline 01/18/21: 44.68 sec's wth RW/brace on left LE    Time 4    Period Weeks    Status Revised      PT LONG TERM GOAL #5   Title Patient will improve gait speed to >/= 0.80 ft/sec to demonstrate improved tolerance for household mobility    Baseline 01/18/21: 45.81 seconds = .72 ft/sec with RW, improved from 0.58 ft/sec just not to goal level    Time 4    Status On-going      Additional Long Term Goals   Additional Long Term Goals Yes      PT LONG TERM GOAL #6   Title Patient will be able to ascend/descend x 12 stairs with B rails and CGA to demonstrate improved safety with household mobiltity    Baseline CGA to Min A with stair negotiation    Time 4    Period Weeks    Status New              01/25/21 0940  Plan  Clinical Impression Statement Today's skilled session continued to focus on activity tolerance, gait with RW and balance training. No issues noted or reported in session. The pt is making steady progress toward goals and should benefit from continued PT to progress toward unmet goals.  Personal Factors and Comorbidities Comorbidity 3+;Time since onset of injury/illness/exacerbation;Age  Comorbidities R Basal Ganglia CVA, Hyperlipidemia, Hypothyroidism, Asthma, Glaucoma, HTN  Examination-Activity Limitations Bed Mobility;Dressing;Locomotion Level;Reach Overhead;Stairs;Stand;Transfers  Examination-Participation Restrictions  Community Activity;Cleaning  Pt will benefit from skilled therapeutic intervention in order to improve on the following deficits Abnormal gait;Decreased balance;Decreased mobility;Decreased endurance;Difficulty walking;Impaired tone;Impaired sensation;Pain;Decreased strength;Decreased safety awareness;Decreased knowledge of use of DME;Decreased coordination;Decreased activity tolerance;Decreased range of motion  Stability/Clinical Decision Making Evolving/Moderate complexity  Rehab Potential Fair  PT Frequency 2x / week  PT Duration 4 weeks  PT Treatment/Interventions ADLs/Self Care Home Management;Cryotherapy;Electrical Stimulation;Moist Heat;DME Instruction;Gait training;Stair training;Functional mobility training;Therapeutic activities;Therapeutic exercise;Balance training;Neuromuscular re-education;Patient/family education;Orthotic Fit/Training;Manual techniques;Passive range of motion  PT Next Visit Plan continued to work on gait with RW, stairs and standing balance/strengthening.  PT Home Exercise Plan Access Code: [email or web address]  Consulted and Agree with Plan of Care Patient         Patient will benefit from skilled therapeutic intervention in order to improve the following deficits and impairments:  Abnormal gait,Decreased balance,Decreased mobility,Decreased endurance,Difficulty walking,Impaired tone,Impaired sensation,Pain,Decreased strength,Decreased safety awareness,Decreased knowledge of use of DME,Decreased coordination,Decreased activity tolerance,Decreased range of motion  Visit Diagnosis: Muscle weakness (generalized)  Unsteadiness on feet  Other abnormalities of gait and mobility     Problem List Patient Active Problem List   Diagnosis Date Noted  . Spasticity 11/30/2020  . Wheelchair dependence 10/07/2020  . Left knee pain 07/26/2020  . Cerebrovascular accident (CVA) of right basal ganglia (HCC) 07/26/2020  . Left hemiparesis (HCC) 07/25/2020  . Hypokalemia  07/20/2020  . Right basal ganglia embolic stroke (HCC) 07/20/2020  . Essential hypertension 07/20/2020  . Hypothyroidism 07/20/2020    Sallyanne Kuster, PTA, [place] [address] [place], [place] [address] [phone] 01/26/21, 4:35 PM   Name: Nicole [name] MRN: [id number] Date of Birth: 03-Oct-1930

## 2021-01-27 ENCOUNTER — Encounter: Payer: Self-pay | Admitting: Physical Therapy

## 2021-01-27 ENCOUNTER — Other Ambulatory Visit: Payer: Self-pay

## 2021-01-27 ENCOUNTER — Ambulatory Visit: Payer: Medicare Other | Admitting: Physical Therapy

## 2021-01-27 ENCOUNTER — Ambulatory Visit: Payer: Medicare Other | Admitting: Occupational Therapy

## 2021-01-27 DIAGNOSIS — M25612 Stiffness of left shoulder, not elsewhere classified: Secondary | ICD-10-CM

## 2021-01-27 DIAGNOSIS — R2689 Other abnormalities of gait and mobility: Secondary | ICD-10-CM | POA: Diagnosis not present

## 2021-01-27 DIAGNOSIS — M6281 Muscle weakness (generalized): Secondary | ICD-10-CM

## 2021-01-27 DIAGNOSIS — I69354 Hemiplegia and hemiparesis following cerebral infarction affecting left non-dominant side: Secondary | ICD-10-CM | POA: Diagnosis not present

## 2021-01-27 DIAGNOSIS — R2681 Unsteadiness on feet: Secondary | ICD-10-CM

## 2021-01-27 DIAGNOSIS — R278 Other lack of coordination: Secondary | ICD-10-CM | POA: Diagnosis not present

## 2021-01-27 DIAGNOSIS — M25512 Pain in left shoulder: Secondary | ICD-10-CM

## 2021-01-27 DIAGNOSIS — R262 Difficulty in walking, not elsewhere classified: Secondary | ICD-10-CM | POA: Diagnosis not present

## 2021-01-27 NOTE — Therapy (Signed)
[facility] [address] [place], [place], [address] Phone: [phone]   Fax:  [phone]  Physical Therapy Treatment  Patient Details  Name: Nicole [name] MRN: [id number] Date of Birth: 1930-03-11 Referring Provider (PT): Genice Rouge, MD   Encounter Date: 01/27/2021   PT End of Session - 01/27/21 1236    Visit Number 27    Number of Visits 33    Date for PT Re-Evaluation 03/21/21   updated POC for 4 weeks, Cert for 60 days   Authorization Type BCBS Medicare    Progress Note Due on Visit 29   (Progress Note completed on 19th visit)   PT Start Time 1232    PT Stop Time 1315    PT Time Calculation (min) 43 min    Equipment Utilized During Treatment Gait belt    Activity Tolerance Patient tolerated treatment well    Behavior During Therapy WFL for tasks assessed/performed           Past Medical History:  Diagnosis Date  . Hypertension   . Stroke [facility])    September 2021  . Thyroid disease     History reviewed. No pertinent surgical history.  There were no vitals filed for this visit.   Subjective Assessment - 01/27/21 1235    Subjective No new complaints. No falls or pain to report. Did climb up the stairs again with Pam, still bumping down on her bottom. No falls.    Patient is accompained by: Family member   caregiver Pam   Pertinent History R Basal Ganglia CVA, Hyperlipidemia, Hypothyroidism, Asthma, Glaucoma, HTN    Limitations Standing;Walking    Currently in Pain? No/denies    Pain Score 0-No pain                  [facility] Adult PT Treatment/Exercise - 01/27/21 1236      Transfers   Transfers Sit to Stand;Stand to Sit    Sit to Stand 4: Min guard;With upper extremity assist    Stand to Sit 4: Min guard;With upper extremity assist;Uncontrolled descent      Ambulation/Gait   Ambulation/Gait Yes    Ambulation/Gait Assistance 5: Supervision;4: Min guard    Ambulation Distance (Feet) 230  Feet   x1, plus around gym with session   Assistive device Rolling walker;Other (Comment)   left AFO   Gait Pattern Step-to pattern;Step-through pattern;Decreased step length - right;Decreased step length - left;Decreased stance time - left;Decreased hip/knee flexion - left;Decreased stride length;Decreased weight shift to left;Left flexed knee in stance;Antalgic;Trunk flexed;Narrow base of support    Ambulation Surface Level;Indoor    Stairs Yes    Stairs Assistance 4: Min guard;4: Min assist    Stairs Assistance Details (indicate cue type and reason) increased assist needed with 2cd rep with descending due to right knee buckling with pt assist to correct.    Stair Management Technique One rail Left;Step to pattern;Sideways    Number of Stairs 4   x2 reps   Height of Stairs 6               Balance Exercises - 01/27/21 1254      Balance Exercises: Standing   Standing Eyes Opened Foam/compliant surface;Other reps (comment);Limitations    Standing Eyes Opened Limitations on airex with UE support- alternating marching, then mini squats for 10 reps each. min guard assist for safety with cues on posture and weight shifting. min assist to step off airex for short rest  breaks due to pt reported fatigue.    Standing Eyes Closed Foam/compliant surface    Standing Eyes Closed Limitations on airex with no UE support: feet together for EC 30 sec's x3 reps, then with feet apart for EC head movements left<>right, up<>down for ~10 reps each. min guard to min assist. Pt very fatigued with stepping off airex with bil knees buckling, min/mod assist to get off airex with UE support on bars, chair brought up to pt in parallel bars. No issues with gait back to mat/wheelchair across room after a short seated rest break.               PT Short Term Goals - 01/20/21 1423      PT SHORT TERM GOAL #1   Title = LTGs             PT Long Term Goals - 01/20/21 1424      PT LONG TERM GOAL #1   Title  Patient will be independent with progressive final HEP with caregiver assistance, and report compliance with daily walking within the home with assistance (ALL LTGs Due: 02/17/21)    Baseline patient continues to benefit from progressive HEP    Time 4    Period Weeks    Status Revised    Target Date 02/17/21      PT LONG TERM GOAL #2   Title Patient will be able to ambulate >/= 200 ft on indoor level surfaces with supervision and LRAD to demonstrate improved household mobility    Baseline 01/18/21: 230' with RW and CGA    Time 4    Period Weeks    Status Revised      PT LONG TERM GOAL #3   Title Patient will completed 5x sit <> stand in </= 15 seconds to demonstrate reduced fall risk and improved balance    Baseline 01/18/21: 22.04 sec's with UE support.    Time 4    Period Weeks    Status Revised      PT LONG TERM GOAL #4   Title Patient will improve TUG to </= 30 seconds to demonstrate reduced fall risk and improved balance    Baseline 01/18/21: 44.68 sec's wth RW/brace on left LE    Time 4    Period Weeks    Status Revised      PT LONG TERM GOAL #5   Title Patient will improve gait speed to >/= 0.80 ft/sec to demonstrate improved tolerance for household mobility    Baseline 01/18/21: 45.81 seconds = .72 ft/sec with RW, improved from 0.58 ft/sec just not to goal level    Time 4    Status On-going      Additional Long Term Goals   Additional Long Term Goals Yes      PT LONG TERM GOAL #6   Title Patient will be able to ascend/descend x 12 stairs with B rails and CGA to demonstrate improved safety with household mobiltity    Baseline CGA to Min A with stair negotiation    Time 4    Period Weeks    Status New                 Plan - 01/27/21 1236    Clinical Impression Statement Today's skilled session continued to focus on gait/stairs and balance training on compliant surfaces. Pt fatiqued after second bout of standing on airex needing min to mod assist to safely step  off due to bil knees buckling. No  other issues noted or reported in session. The pt is progressing toward goals and should benefit from continued PT to progress toward unmet goals.    Personal Factors and Comorbidities Comorbidity 3+;Time since onset of injury/illness/exacerbation;Age    Comorbidities R Basal Ganglia CVA, Hyperlipidemia, Hypothyroidism, Asthma, Glaucoma, HTN    Examination-Activity Limitations Bed Mobility;Dressing;Locomotion Level;Reach Overhead;Stairs;Stand;Transfers    Examination-Participation Restrictions Psychiatric nurse Evolving/Moderate complexity    Rehab Potential Fair    PT Frequency 2x / week    PT Duration 4 weeks    PT Treatment/Interventions ADLs/Self Care Home Management;Cryotherapy;Electrical Stimulation;Moist Heat;DME Instruction;Gait training;Stair training;Functional mobility training;Therapeutic activities;Therapeutic exercise;Balance training;Neuromuscular re-education;Patient/family education;Orthotic Fit/Training;Manual techniques;Passive range of motion    PT Next Visit Plan continued to work on gait with RW, stairs and standing balance/strengthening.    PT Home Exercise Plan Access Code: [email or web address]    Consulted and Agree with Plan of Care Patient           Patient will benefit from skilled therapeutic intervention in order to improve the following deficits and impairments:  Abnormal gait,Decreased balance,Decreased mobility,Decreased endurance,Difficulty walking,Impaired tone,Impaired sensation,Pain,Decreased strength,Decreased safety awareness,Decreased knowledge of use of DME,Decreased coordination,Decreased activity tolerance,Decreased range of motion  Visit Diagnosis: Muscle weakness (generalized)  Unsteadiness on feet  Other abnormalities of gait and mobility     Problem List Patient Active Problem List   Diagnosis Date Noted  . Spasticity 11/30/2020  . Wheelchair dependence 10/07/2020  .  Left knee pain 07/26/2020  . Cerebrovascular accident (CVA) of right basal ganglia (HCC) 07/26/2020  . Left hemiparesis (HCC) 07/25/2020  . Hypokalemia 07/20/2020  . Right basal ganglia embolic stroke (HCC) 07/20/2020  . Essential hypertension 07/20/2020  . Hypothyroidism 07/20/2020    Sallyanne Kuster, PTA, [facility] [address], [address] [place], [place] [address] [phone] 01/27/21, 1:21 PM   Name: Nicole [name] MRN: [id number] Date of Birth: 1930/04/29

## 2021-01-27 NOTE — Therapy (Signed)
[place] [address] [place], [place], [address] Phone: [phone]   Fax:  [phone]  Occupational Therapy Treatment & Recertification Note  Patient Details  Name: Nicole [name] MRN: [id number] Date of Birth: 07-21-1930 Referring Provider (OT): Courtney Heys, MD   Encounter Date: 01/27/2021   OT End of Session - 01/27/21 1316    Visit Number 23    Number of Visits 31   +8 visits at renewal   Date for OT Re-Evaluation 02/24/21    Authorization Type BCBS Medicare    Authorization Time Period $40 Copay VL:MN No Auth required  10th visit PN     Authorization - Visit Number 23    Authorization - Number of Visits 33    Progress Note Due on Visit 65    OT Start Time 1315    OT Stop Time 1400    OT Time Calculation (min) 45 min    Activity Tolerance Patient tolerated treatment well    Behavior During Therapy [facility] for tasks assessed/performed           Past Medical History:  Diagnosis Date  . Hypertension   . Stroke [facility])    September 2021  . Thyroid disease     No past surgical history on file.  There were no vitals filed for this visit.      TREATMENT:  Table slides: x 10 forward reaching for warming up shoulder.  Medium Pegs with LUE with mod cues (tactile, visual and verbal) for reducing lateral flexion to right and shoulder hiking. Pt provided pillow behind back in wheelchair for assisting upright sitting position. Pt copied pattern with 1 error - able to identify and correct with min verbal cues.  ADLs: functional use of bimanual skills with opening and closing different sized containers  Resistance Clothespins: 1-8# with LUE with foam roll on right side for input to decrease lateral flexion. Pt still continues to demonstrate poor compensation habits with shoulder flexion.                     OT Short Term Goals - 01/27/21 1319      OT SHORT TERM GOAL #1   Title Pt will be  independent with HEP (12/07/20)    Time 4    Period Weeks    Status Achieved    Target Date 12/07/20      OT SHORT TERM GOAL #2   Title Pt will improve grip strength in LUE by 3 lbs or greater for increase in functional use of LUE and increase independence with clothing management.    Baseline RUE 42.1 LUE 26.8    Time 4    Period Weeks    Status Achieved   37.9 LUE     OT SHORT TERM GOAL #3   Title Pt will obtain item from mid level shelf with increase in shoulder flexion to 70 degrees with good positioning and reduction in shoulder hiking and compensatory movements.    Baseline LUE 66 degrees sh flexion    Time 4    Period Weeks    Status Achieved   90* on 12/23/2020     OT SHORT TERM GOAL #4   Title Pt will demonstrate improved box and blocks score to 35 or greater with LUE for increase in functional use of LUE.    Baseline 29 LUE    Time 4    Period Weeks    Status Achieved   01/25/21  35 blocks     OT SHORT TERM GOAL #5   Title Pt will verbalize understanding of sleep positions for LUE to decrease pain.    Baseline 6/10 LUE shoulder pain at night    Time 4    Period Weeks    Status Achieved      OT SHORT TERM GOAL #6   Title Pt will don bra with mod I    Baseline daughter currently assisting    Time 4    Period Weeks    Status Achieved   12/07/20 - pt reports donning bra all clothing     OT SHORT TERM GOAL #7   Title --    Time --    Period --    Status --             OT Long Term Goals - 01/27/21 1319      OT LONG TERM GOAL #1   Title Pt will be independent with updated HEP 01/04/2021    Time 8    Period Weeks    Status On-going   pt completing shoulder HEPs - ongoing to update PRN     OT LONG TERM GOAL #2   Title Pt will demonstrate increased grip strength in LUE to 33 lbs or greater for improved functional use. UPDATE: 37 lbs or greater    Baseline RUE 42.1 LUE 26.8    Time 8    Period Weeks    Status Achieved   42.9 lbs on 01/25/21     OT LONG TERM  GOAL #3   Title Pt will perform UB and LB dressing with mod I.    Time 8    Period Weeks    Status Achieved   pt and caregiver report pt is doing all dressing except shower days     OT LONG TERM GOAL #4   Title Pt will increase fine motor coordination by completing 9 hole peg test in 50 seconds or less with LUE. Upgraded 12/23/2020: 45seconds or less with LUE UPDATE: 38s or less with L elbow on table    Baseline RUE 27.47 LUE 61.87    Time 8    Period Weeks    Status On-going   01/25/21 40s with LUE     OT LONG TERM GOAL #5   Title Pt will improve shoulder flexion to at least 100 degrees in order to obtain object and increase functional reach with minimal shoulder hiking and/or lateral flexion    Baseline LUE 66 degrees sh flexion    Time 8    Period Weeks    Status Revised   110* with compensatory movements on 01/25/21     OT LONG TERM GOAL #6   Title Pt will report decrease in pain in LUE shoulder at night to less than or equal to 4/10 with improved sleep positioning and range of motion    Time 8    Period Weeks    Status Achieved      OT LONG TERM GOAL #7   Title Pt will report using LUE for functional activities at least 50% of the time for bimanual tasks    Time 4    Period Weeks    Status New    Target Date 02/24/21                 Plan - 01/27/21 1317    Clinical Impression Statement Pt has met 4/6 LTGs and all STGs. Renewal done today to  continue working and making progress towards goals.    OT Occupational Profile and History Problem Focused Assessment - Including review of records relating to presenting problem    Occupational performance deficits (Please refer to evaluation for details): ADL's;IADL's    Body Structure / Function / Physical Skills ADL;Balance;Coordination;IADL;Pain;GMC;Hearing;Strength;Tone;Sensation;Dexterity;Decreased knowledge of use of DME;FMC;UE functional use;ROM;Flexibility    Cognitive Skills Attention;Sequencing;Problem Solve    OT  Frequency 2x / week    OT Duration 4 weeks   renewal completed for 2x/week for 4 weeks   OT Treatment/Interventions Self-care/ADL training;Cryotherapy;Ultrasound;Moist Heat;Electrical Stimulation;Paraffin;Energy conservation;DME and/or AE instruction;Manual Therapy;Functional Mobility Training;Neuromuscular education;Therapeutic exercise;Visual/perceptual remediation/compensation;Patient/family education;Balance training;Therapeutic activities;Passive range of motion;Cognitive remediation/compensation    Plan renewal for 2x/week for 4 weeks    Consulted and Agree with Plan of Care Family member/caregiver;Patient    Family Member Consulted caregiever Pam           Patient will benefit from skilled therapeutic intervention in order to improve the following deficits and impairments:   Body Structure / Function / Physical Skills: ADL,Balance,Coordination,IADL,Pain,GMC,Hearing,Strength,Tone,Sensation,Dexterity,Decreased knowledge of use of DME,FMC,UE functional use,ROM,Flexibility Cognitive Skills: Attention,Sequencing,Problem Solve     Visit Diagnosis: Muscle weakness (generalized) - Plan: Ot plan of care cert/re-cert  Hemiplegia and hemiparesis following cerebral infarction affecting left non-dominant side ([place]) - Plan: Ot plan of care cert/re-cert  Stiffness of left shoulder, not elsewhere classified - Plan: Ot plan of care cert/re-cert  Other lack of coordination - Plan: Ot plan of care cert/re-cert  Acute pain of left shoulder - Plan: Ot plan of care cert/re-cert    Problem List Patient Active Problem List   Diagnosis Date Noted  . Spasticity 11/30/2020  . Wheelchair dependence 10/07/2020  . Left knee pain 07/26/2020  . Cerebrovascular accident (CVA) of right basal ganglia ([place]) 07/26/2020  . Left hemiparesis ([place]) 07/25/2020  . Hypokalemia 07/20/2020  . Right basal ganglia embolic stroke ([place]) 79/98/7215  . Essential hypertension 07/20/2020  . Hypothyroidism 07/20/2020     Zachery Conch MOT, OTR/L  01/27/2021, 1:59 PM  [place] [address] [place], [place], [address] Phone: [phone]   Fax:  [phone]  Name: Nicole [name] MRN: [id number] Date of Birth: 1930-05-17

## 2021-02-01 ENCOUNTER — Ambulatory Visit: Payer: Medicare Other | Admitting: Physical Therapy

## 2021-02-01 ENCOUNTER — Encounter: Payer: Self-pay | Admitting: Occupational Therapy

## 2021-02-01 ENCOUNTER — Ambulatory Visit: Payer: Medicare Other | Admitting: Occupational Therapy

## 2021-02-01 ENCOUNTER — Other Ambulatory Visit: Payer: Self-pay

## 2021-02-01 ENCOUNTER — Encounter: Payer: Self-pay | Admitting: Physical Therapy

## 2021-02-01 DIAGNOSIS — I69354 Hemiplegia and hemiparesis following cerebral infarction affecting left non-dominant side: Secondary | ICD-10-CM | POA: Diagnosis not present

## 2021-02-01 DIAGNOSIS — M6281 Muscle weakness (generalized): Secondary | ICD-10-CM

## 2021-02-01 DIAGNOSIS — M25512 Pain in left shoulder: Secondary | ICD-10-CM | POA: Diagnosis not present

## 2021-02-01 DIAGNOSIS — R2689 Other abnormalities of gait and mobility: Secondary | ICD-10-CM | POA: Diagnosis not present

## 2021-02-01 DIAGNOSIS — R278 Other lack of coordination: Secondary | ICD-10-CM | POA: Diagnosis not present

## 2021-02-01 DIAGNOSIS — R262 Difficulty in walking, not elsewhere classified: Secondary | ICD-10-CM | POA: Diagnosis not present

## 2021-02-01 DIAGNOSIS — R2681 Unsteadiness on feet: Secondary | ICD-10-CM

## 2021-02-01 DIAGNOSIS — M25612 Stiffness of left shoulder, not elsewhere classified: Secondary | ICD-10-CM | POA: Diagnosis not present

## 2021-02-01 NOTE — Therapy (Signed)
[facility] [address] [place], [place], [address] Phone: [phone]   Fax:  [phone]  Occupational Therapy Treatment  Patient Details  Name: Nicole [name] MRN: [id number] Date of Birth: [birth year] Referring Provider (OT): Genice Rouge, MD   Encounter Date: 02/01/2021   OT End of Session - 02/01/21 1336    Visit Number 24    Number of Visits 31   +8 visits at renewal   Date for OT Re-Evaluation 02/24/21    Authorization Type BCBS Medicare    Authorization Time Period $40 Copay VL:MN No Auth required  10th visit PN     Authorization - Visit Number 24    Authorization - Number of Visits 33    Progress Note Due on Visit 33    OT Start Time 1318    OT Stop Time 1358    OT Time Calculation (min) 40 min    Activity Tolerance Patient tolerated treatment well    Behavior During Therapy [facility] for tasks assessed/performed           Past Medical History:  Diagnosis Date  . Hypertension   . Stroke [facility])    September 2021  . Thyroid disease     History reviewed. No pertinent surgical history.  There were no vitals filed for this visit.   Subjective Assessment - 02/01/21 1320    Subjective  Pt denies any pain.    Pertinent History CVA R basal ganglia, HTN, HLD, hypothyroidism, asthma, glaucoma    Limitations fall risk. hard of hearing    Currently in Pain? No/denies                     Treatment: supine closed chain shoulder flexion with min facilitation at shoulder and scapula. Seated weightbearing through bilateral UE's , with scapular retraction protraction and A-P tilts. Standing at table to place and remove large to small pegs from pegboard with LUE, min guard for balance and min facilitation/ v.c Block under right foot for weight shift to left . Placing and removing graded clothespins from rack, with LUE, min facillitation/ v.c, seated and standing for functional reach and sustained pinch.   Stacking coins with left hand then placing in bank, min v.c for increased fine motor coordination.               OT Short Term Goals - 01/27/21 1319      OT SHORT TERM GOAL #1   Title Pt will be independent with HEP (12/07/20)    Time 4    Period Weeks    Status Achieved    Target Date 12/07/20      OT SHORT TERM GOAL #2   Title Pt will improve grip strength in LUE by 3 lbs or greater for increase in functional use of LUE and increase independence with clothing management.    Baseline RUE 42.1 LUE 26.8    Time 4    Period Weeks    Status Achieved   37.9 LUE     OT SHORT TERM GOAL #3   Title Pt will obtain item from mid level shelf with increase in shoulder flexion to 70 degrees with good positioning and reduction in shoulder hiking and compensatory movements.    Baseline LUE 66 degrees sh flexion    Time 4    Period Weeks    Status Achieved   90* on 12/23/2020     OT SHORT TERM GOAL #4   Title  Pt will demonstrate improved box and blocks score to 35 or greater with LUE for increase in functional use of LUE.    Baseline 29 LUE    Time 4    Period Weeks    Status Achieved   01/25/21 35 blocks     OT SHORT TERM GOAL #5   Title Pt will verbalize understanding of sleep positions for LUE to decrease pain.    Baseline 6/10 LUE shoulder pain at night    Time 4    Period Weeks    Status Achieved      OT SHORT TERM GOAL #6   Title Pt will don bra with mod I    Baseline daughter currently assisting    Time 4    Period Weeks    Status Achieved   12/07/20 - pt reports donning bra all clothing     OT SHORT TERM GOAL #7   Title --    Time --    Period --    Status --             OT Long Term Goals - 01/27/21 1319      OT LONG TERM GOAL #1   Title Pt will be independent with updated HEP 01/04/2021    Time 8    Period Weeks    Status On-going   pt completing shoulder HEPs - ongoing to update PRN     OT LONG TERM GOAL #2   Title Pt will demonstrate increased grip  strength in LUE to 33 lbs or greater for improved functional use. UPDATE: 37 lbs or greater    Baseline RUE 42.1 LUE 26.8    Time 8    Period Weeks    Status Achieved   42.9 lbs on 01/25/21     OT LONG TERM GOAL #3   Title Pt will perform UB and LB dressing with mod I.    Time 8    Period Weeks    Status Achieved   pt and caregiver report pt is doing all dressing except shower days     OT LONG TERM GOAL #4   Title Pt will increase fine motor coordination by completing 9 hole peg test in 50 seconds or less with LUE. Upgraded 12/23/2020: 45seconds or less with LUE UPDATE: 38s or less with L elbow on table    Baseline RUE 27.47 LUE 61.87    Time 8    Period Weeks    Status On-going   01/25/21 40s with LUE     OT LONG TERM GOAL #5   Title Pt will improve shoulder flexion to at least 100 degrees in order to obtain object and increase functional reach with minimal shoulder hiking and/or lateral flexion    Baseline LUE 66 degrees sh flexion    Time 8    Period Weeks    Status Revised   110* with compensatory movements on 01/25/21     OT LONG TERM GOAL #6   Title Pt will report decrease in pain in LUE shoulder at night to less than or equal to 4/10 with improved sleep positioning and range of motion    Time 8    Period Weeks    Status Achieved      OT LONG TERM GOAL #7   Title Pt will report using LUE for functional activities at least 50% of the time for bimanual tasks    Time 4    Period Weeks    Status  New    Target Date 02/24/21                 Plan - 02/01/21 1336    Clinical Impression Statement Pt is progressing towards goals with improving LUE functional use.    OT Occupational Profile and History Problem Focused Assessment - Including review of records relating to presenting problem    Occupational performance deficits (Please refer to evaluation for details): ADL's;IADL's    Body Structure / Function / Physical Skills  ADL;Balance;Coordination;IADL;Pain;GMC;Hearing;Strength;Tone;Sensation;Dexterity;Decreased knowledge of use of DME;FMC;UE functional use;ROM;Flexibility    Cognitive Skills Attention;Sequencing;Problem Solve    OT Frequency 2x / week    OT Duration 4 weeks   renewal completed for 2x/week for 4 weeks   OT Treatment/Interventions Self-care/ADL training;Cryotherapy;Ultrasound;Moist Heat;Electrical Stimulation;Paraffin;Energy conservation;DME and/or AE instruction;Manual Therapy;Functional Mobility Training;Neuromuscular education;Therapeutic exercise;Visual/perceptual remediation/compensation;Patient/family education;Balance training;Therapeutic activities;Passive range of motion;Cognitive remediation/compensation    Plan LUE NMR,    Consulted and Agree with Plan of Care Family member/caregiver;Patient    Family Member Consulted caregiever Pam           Patient will benefit from skilled therapeutic intervention in order to improve the following deficits and impairments:   Body Structure / Function / Physical Skills: ADL,Balance,Coordination,IADL,Pain,GMC,Hearing,Strength,Tone,Sensation,Dexterity,Decreased knowledge of use of DME,FMC,UE functional use,ROM,Flexibility Cognitive Skills: Attention,Sequencing,Problem Solve     Visit Diagnosis: Muscle weakness (generalized)  Hemiplegia and hemiparesis following cerebral infarction affecting left non-dominant side (HCC)  Stiffness of left shoulder, not elsewhere classified  Other lack of coordination    Problem List Patient Active Problem List   Diagnosis Date Noted  . Spasticity 11/30/2020  . Wheelchair dependence 10/07/2020  . Left knee pain 07/26/2020  . Cerebrovascular accident (CVA) of right basal ganglia (HCC) 07/26/2020  . Left hemiparesis (HCC) 07/25/2020  . Hypokalemia 07/20/2020  . Right basal ganglia embolic stroke (HCC) 07/20/2020  . Essential hypertension 07/20/2020  . Hypothyroidism 07/20/2020     Kaian Fahs 02/01/2021, 1:54 PM  [facility] [address] [place], [place], [address] Phone: [phone]   Fax:  [phone]  Name: SHANVI MOYD MRN: [id number] Date of Birth: [birth year]

## 2021-02-02 NOTE — Therapy (Signed)
[facility] [address] [name], [place], [address] Phone: [phone]   Fax:  [phone]  Physical Therapy Treatment  Patient Details  Name: Nicole [name] MRN: [id number] Date of Birth: [birth year] Referring Provider (PT): Genice Rouge, MD   Encounter Date: 02/01/2021   PT End of Session - 02/01/21 1236    Visit Number 28    Number of Visits 33    Date for PT Re-Evaluation 03/21/21   updated POC for 4 weeks, Cert for 60 days   Authorization Type BCBS Medicare    Progress Note Due on Visit 29   (Progress Note completed on 19th visit)   PT Start Time 1232    PT Stop Time 1315    PT Time Calculation (min) 43 min    Equipment Utilized During Treatment Gait belt    Activity Tolerance Patient tolerated treatment well    Behavior During Therapy WFL for tasks assessed/performed           Past Medical History:  Diagnosis Date  . Hypertension   . Stroke [facility])    September 2021  . Thyroid disease     History reviewed. No pertinent surgical history.  There were no vitals filed for this visit.   Subjective Assessment - 02/01/21 1235    Subjective No new complaints. Went down the last set of stairs this moring with Pam, right knee does still give toward the end. No falls or pain to report.    Patient is accompained by: Family member   caregiver Pam   Pertinent History R Basal Ganglia CVA, Hyperlipidemia, Hypothyroidism, Asthma, Glaucoma, HTN    Limitations Standing;Walking    Patient Stated Goals Be able to walk;    Currently in Pain? No/denies    Pain Score 0-No pain               [facility] Adult PT Treatment/Exercise - 02/01/21 1237      Transfers   Transfers Sit to Stand;Stand to Sit    Sit to Stand 4: Min guard;With upper extremity assist    Stand to Sit 4: Min guard;With upper extremity assist;Uncontrolled descent      Ambulation/Gait   Ambulation/Gait Yes    Ambulation/Gait Assistance 5:  Supervision;4: Min guard    Ambulation/Gait Assistance Details occasional reminder cues for upright posture and walker proximity with gait.    Ambulation Distance (Feet) 230 Feet   x1, plus around gym with session   Assistive device Rolling walker;Other (Comment)    Gait Pattern Step-to pattern;Step-through pattern;Decreased step length - right;Decreased step length - left;Decreased stance time - left;Decreased hip/knee flexion - left;Decreased stride length;Decreased weight shift to left;Left flexed knee in stance;Antalgic;Trunk flexed;Narrow base of support    Ambulation Surface Level;Indoor      High Level Balance   High Level Balance Activities Side stepping;Marching forwards;Backward walking    High Level Balance Comments on blue mat in parallel bars with light UE support fo 3 laps each way. cues on posure, weight shifting and ex technique. min guard to min assist for balance/safety.      Knee/Hip Exercises: Aerobic   Other Aerobic Scifit level 2.0 for 8 minutes with BLEs only for strengthening and activity tolerance, cues to go through full range of motion, needing intermittent assist to keep LLE in proper position and green band assist to keep left foot from sliding off pedal.                PT  Short Term Goals - 01/20/21 1423      PT SHORT TERM GOAL #1   Title = LTGs             PT Long Term Goals - 01/20/21 1424      PT LONG TERM GOAL #1   Title Patient will be independent with progressive final HEP with caregiver assistance, and report compliance with daily walking within the home with assistance (ALL LTGs Due: 02/17/21)    Baseline patient continues to benefit from progressive HEP    Time 4    Period Weeks    Status Revised    Target Date 02/17/21      PT LONG TERM GOAL #2   Title Patient will be able to ambulate >/= 200 ft on indoor level surfaces with supervision and LRAD to demonstrate improved household mobility    Baseline 01/18/21: 230' with RW and CGA     Time 4    Period Weeks    Status Revised      PT LONG TERM GOAL #3   Title Patient will completed 5x sit <> stand in </= 15 seconds to demonstrate reduced fall risk and improved balance    Baseline 01/18/21: 22.04 sec's with UE support.    Time 4    Period Weeks    Status Revised      PT LONG TERM GOAL #4   Title Patient will improve TUG to </= 30 seconds to demonstrate reduced fall risk and improved balance    Baseline 01/18/21: 44.68 sec's wth RW/brace on left LE    Time 4    Period Weeks    Status Revised      PT LONG TERM GOAL #5   Title Patient will improve gait speed to >/= 0.80 ft/sec to demonstrate improved tolerance for household mobility    Baseline 01/18/21: 45.81 seconds = .72 ft/sec with RW, improved from 0.58 ft/sec just not to goal level    Time 4    Status On-going      Additional Long Term Goals   Additional Long Term Goals Yes      PT LONG TERM GOAL #6   Title Patient will be able to ascend/descend x 12 stairs with B rails and CGA to demonstrate improved safety with household mobiltity    Baseline CGA to Min A with stair negotiation    Time 4    Period Weeks    Status New                 Plan - 02/01/21 1237    Clinical Impression Statement Today's skilled session continued to focus on strengthening, gait with RW and balance training. Rest breaks taken as needed due to fatigue, no other issues noted or reported by pt in session. The pt is progressing toward goals and should benefit from continued PT to progress toward unmet goals.    Personal Factors and Comorbidities Comorbidity 3+;Time since onset of injury/illness/exacerbation;Age    Comorbidities R Basal Ganglia CVA, Hyperlipidemia, Hypothyroidism, Asthma, Glaucoma, HTN    Examination-Activity Limitations Bed Mobility;Dressing;Locomotion Level;Reach Overhead;Stairs;Stand;Transfers    Examination-Participation Restrictions Psychiatric nurse  Evolving/Moderate complexity    Rehab Potential Fair    PT Frequency 2x / week    PT Duration 4 weeks    PT Treatment/Interventions ADLs/Self Care Home Management;Cryotherapy;Electrical Stimulation;Moist Heat;DME Instruction;Gait training;Stair training;Functional mobility training;Therapeutic activities;Therapeutic exercise;Balance training;Neuromuscular re-education;Patient/family education;Orthotic Fit/Training;Manual techniques;Passive range of motion    PT Next Visit  Plan continued to work on gait with RW, stairs and standing balance/strengthening.    PT Home Exercise Plan Access Code: [email or web address]    Consulted and Agree with Plan of Care Patient           Patient will benefit from skilled therapeutic intervention in order to improve the following deficits and impairments:  Abnormal gait,Decreased balance,Decreased mobility,Decreased endurance,Difficulty walking,Impaired tone,Impaired sensation,Pain,Decreased strength,Decreased safety awareness,Decreased knowledge of use of DME,Decreased coordination,Decreased activity tolerance,Decreased range of motion  Visit Diagnosis: Muscle weakness (generalized)  Unsteadiness on feet  Other abnormalities of gait and mobility     Problem List Patient Active Problem List   Diagnosis Date Noted  . Spasticity 11/30/2020  . Wheelchair dependence 10/07/2020  . Left knee pain 07/26/2020  . Cerebrovascular accident (CVA) of right basal ganglia (HCC) 07/26/2020  . Left hemiparesis (HCC) 07/25/2020  . Hypokalemia 07/20/2020  . Right basal ganglia embolic stroke (HCC) 07/20/2020  . Essential hypertension 07/20/2020  . Hypothyroidism 07/20/2020    Sallyanne Kuster, PTA, [facility] [address] [place], [place] [address] [phone] 02/02/21, 1:40 PM   Name: MARNAE MADANI MRN: [id number] Date of Birth: [birth year]

## 2021-02-03 ENCOUNTER — Ambulatory Visit: Payer: Medicare Other | Attending: Physical Medicine and Rehabilitation

## 2021-02-03 ENCOUNTER — Ambulatory Visit: Payer: Medicare Other | Admitting: Occupational Therapy

## 2021-02-03 ENCOUNTER — Encounter: Payer: Self-pay | Admitting: Occupational Therapy

## 2021-02-03 ENCOUNTER — Other Ambulatory Visit: Payer: Self-pay

## 2021-02-03 DIAGNOSIS — R262 Difficulty in walking, not elsewhere classified: Secondary | ICD-10-CM

## 2021-02-03 DIAGNOSIS — M25512 Pain in left shoulder: Secondary | ICD-10-CM | POA: Diagnosis not present

## 2021-02-03 DIAGNOSIS — M6281 Muscle weakness (generalized): Secondary | ICD-10-CM

## 2021-02-03 DIAGNOSIS — R2689 Other abnormalities of gait and mobility: Secondary | ICD-10-CM

## 2021-02-03 DIAGNOSIS — R278 Other lack of coordination: Secondary | ICD-10-CM | POA: Insufficient documentation

## 2021-02-03 DIAGNOSIS — I69354 Hemiplegia and hemiparesis following cerebral infarction affecting left non-dominant side: Secondary | ICD-10-CM | POA: Diagnosis not present

## 2021-02-03 DIAGNOSIS — M25612 Stiffness of left shoulder, not elsewhere classified: Secondary | ICD-10-CM | POA: Diagnosis not present

## 2021-02-03 DIAGNOSIS — R2681 Unsteadiness on feet: Secondary | ICD-10-CM | POA: Diagnosis not present

## 2021-02-03 NOTE — Therapy (Signed)
[facility] [address] [place], [place], [address] Phone: [phone]   Fax:  [phone]  Occupational Therapy Treatment  Patient Details  Name: Nicole [name] MRN: [id number] Date of Birth: 1930-09-04 Referring Provider (OT): Genice Rouge, MD   Encounter Date: 02/03/2021   OT End of Session - 02/03/21 1329    Visit Number 25    Number of Visits 31   +8 visits at renewal   Date for OT Re-Evaluation 02/24/21    Authorization Type BCBS Medicare    Authorization Time Period $40 Copay VL:MN No Auth required  10th visit PN     Authorization - Visit Number 25    Authorization - Number of Visits 33    Progress Note Due on Visit 33    OT Start Time 1230    OT Stop Time 1311    OT Time Calculation (min) 41 min    Activity Tolerance Patient tolerated treatment well    Behavior During Therapy [facility] for tasks assessed/performed           Past Medical History:  Diagnosis Date  . Hypertension   . Stroke [facility])    September 2021  . Thyroid disease     History reviewed. No pertinent surgical history.  There were no vitals filed for this visit.   Subjective Assessment - 02/03/21 1230    Subjective  Pt denies any pain.    Pertinent History CVA R basal ganglia, HTN, HLD, hypothyroidism, asthma, glaucoma    Limitations fall risk. hard of hearing    Currently in Pain? No/denies              TREATMENT:  Standing and LUE use with standing with rolling walker with SBA and completing Medium Peg board design with LUE and for coordination and reach with LUE with decrease in compensatory movements (shoulder hiking and lateral flexion). Pt with decrease compensatory movements while standing vs sitting.  Pt copied pattern with min v.c. Continued target areas with standing and placing resistance clothespins on low level with LUE.                     OT Short Term Goals - 01/27/21 1319      OT SHORT TERM GOAL  #1   Title Pt will be independent with HEP (12/07/20)    Time 4    Period Weeks    Status Achieved    Target Date 12/07/20      OT SHORT TERM GOAL #2   Title Pt will improve grip strength in LUE by 3 lbs or greater for increase in functional use of LUE and increase independence with clothing management.    Baseline RUE 42.1 LUE 26.8    Time 4    Period Weeks    Status Achieved   37.9 LUE     OT SHORT TERM GOAL #3   Title Pt will obtain item from mid level shelf with increase in shoulder flexion to 70 degrees with good positioning and reduction in shoulder hiking and compensatory movements.    Baseline LUE 66 degrees sh flexion    Time 4    Period Weeks    Status Achieved   90* on 12/23/2020     OT SHORT TERM GOAL #4   Title Pt will demonstrate improved box and blocks score to 35 or greater with LUE for increase in functional use of LUE.    Baseline 29 LUE  Time 4    Period Weeks    Status Achieved   01/25/21 35 blocks     OT SHORT TERM GOAL #5   Title Pt will verbalize understanding of sleep positions for LUE to decrease pain.    Baseline 6/10 LUE shoulder pain at night    Time 4    Period Weeks    Status Achieved      OT SHORT TERM GOAL #6   Title Pt will don bra with mod I    Baseline daughter currently assisting    Time 4    Period Weeks    Status Achieved   12/07/20 - pt reports donning bra all clothing     OT SHORT TERM GOAL #7   Title --    Time --    Period --    Status --             OT Long Term Goals - 01/27/21 1319      OT LONG TERM GOAL #1   Title Pt will be independent with updated HEP 01/04/2021    Time 8    Period Weeks    Status On-going   pt completing shoulder HEPs - ongoing to update PRN     OT LONG TERM GOAL #2   Title Pt will demonstrate increased grip strength in LUE to 33 lbs or greater for improved functional use. UPDATE: 37 lbs or greater    Baseline RUE 42.1 LUE 26.8    Time 8    Period Weeks    Status Achieved   42.9 lbs on  01/25/21     OT LONG TERM GOAL #3   Title Pt will perform UB and LB dressing with mod I.    Time 8    Period Weeks    Status Achieved   pt and caregiver report pt is doing all dressing except shower days     OT LONG TERM GOAL #4   Title Pt will increase fine motor coordination by completing 9 hole peg test in 50 seconds or less with LUE. Upgraded 12/23/2020: 45seconds or less with LUE UPDATE: 38s or less with L elbow on table    Baseline RUE 27.47 LUE 61.87    Time 8    Period Weeks    Status On-going   01/25/21 40s with LUE     OT LONG TERM GOAL #5   Title Pt will improve shoulder flexion to at least 100 degrees in order to obtain object and increase functional reach with minimal shoulder hiking and/or lateral flexion    Baseline LUE 66 degrees sh flexion    Time 8    Period Weeks    Status Revised   110* with compensatory movements on 01/25/21     OT LONG TERM GOAL #6   Title Pt will report decrease in pain in LUE shoulder at night to less than or equal to 4/10 with improved sleep positioning and range of motion    Time 8    Period Weeks    Status Achieved      OT LONG TERM GOAL #7   Title Pt will report using LUE for functional activities at least 50% of the time for bimanual tasks    Time 4    Period Weeks    Status New    Target Date 02/24/21                 Plan - 02/03/21 1328  Clinical Impression Statement Pt is progressing towards goals with improving LUE functional use.    OT Occupational Profile and History Problem Focused Assessment - Including review of records relating to presenting problem    Occupational performance deficits (Please refer to evaluation for details): ADL's;IADL's    Body Structure / Function / Physical Skills ADL;Balance;Coordination;IADL;Pain;GMC;Hearing;Strength;Tone;Sensation;Dexterity;Decreased knowledge of use of DME;FMC;UE functional use;ROM;Flexibility    Cognitive Skills Attention;Sequencing;Problem Solve    OT Frequency 2x /  week    OT Duration 4 weeks   renewal completed for 2x/week for 4 weeks   OT Treatment/Interventions Self-care/ADL training;Cryotherapy;Ultrasound;Moist Heat;Electrical Stimulation;Paraffin;Energy conservation;DME and/or AE instruction;Manual Therapy;Functional Mobility Training;Neuromuscular education;Therapeutic exercise;Visual/perceptual remediation/compensation;Patient/family education;Balance training;Therapeutic activities;Passive range of motion;Cognitive remediation/compensation    Plan LUE NMR,    Consulted and Agree with Plan of Care Family member/caregiver;Patient    Family Member Consulted caregiever Pam           Patient will benefit from skilled therapeutic intervention in order to improve the following deficits and impairments:   Body Structure / Function / Physical Skills: ADL,Balance,Coordination,IADL,Pain,GMC,Hearing,Strength,Tone,Sensation,Dexterity,Decreased knowledge of use of DME,FMC,UE functional use,ROM,Flexibility Cognitive Skills: Attention,Sequencing,Problem Solve     Visit Diagnosis: Muscle weakness (generalized)  Hemiplegia and hemiparesis following cerebral infarction affecting left non-dominant side (HCC)  Stiffness of left shoulder, not elsewhere classified  Other lack of coordination  Unsteadiness on feet    Problem List Patient Active Problem List   Diagnosis Date Noted  . Spasticity 11/30/2020  . Wheelchair dependence 10/07/2020  . Left knee pain 07/26/2020  . Cerebrovascular accident (CVA) of right basal ganglia (HCC) 07/26/2020  . Left hemiparesis (HCC) 07/25/2020  . Hypokalemia 07/20/2020  . Right basal ganglia embolic stroke (HCC) 07/20/2020  . Essential hypertension 07/20/2020  . Hypothyroidism 07/20/2020    Junious Dresser MOT, OTR/L  02/03/2021, 1:29 PM  [facility] [address] [place], [place], [address] Phone: [phone]   Fax:  [phone]  Name: Nicole [name] MRN: [id number] Date of Birth: 1930/05/11

## 2021-02-03 NOTE — Therapy (Signed)
[facility] [address] [place], [place], [address] Phone: [phone]   Fax:  [phone]  Physical Therapy Treatment/Progress Note  Patient Details  Name: Nicole [name] MRN: [id number] Date of Birth: [birth year] Referring Provider (PT): Genice Rouge, MD  Physical Therapy Progress Note   Dates of Reporting Period: 01/03/21 - 02/03/21  See Note below for Objective Data and Assessment of Progress/Goals.  Thank you for the referral of this patient. Adelfa Koh, PT, DPT   Encounter Date: 02/03/2021   PT End of Session - 02/03/21 1318    Visit Number 29    Number of Visits 33    Date for PT Re-Evaluation 03/21/21   updated POC for 4 weeks, Cert for 60 days   Authorization Type BCBS Medicare    Progress Note Due on Visit 39   (Progress Note completed on 19th visit)   PT Start Time 1315    PT Stop Time 1400    PT Time Calculation (min) 45 min    Equipment Utilized During Treatment Gait belt    Activity Tolerance Patient tolerated treatment well    Behavior During Therapy WFL for tasks assessed/performed           Past Medical History:  Diagnosis Date  . Hypertension   . Stroke [facility])    September 2021  . Thyroid disease     History reviewed. No pertinent surgical history.  There were no vitals filed for this visit.   Subjective Assessment - 02/03/21 1318    Subjective No new changes/complaints. No falls to report. No pain.    Patient is accompained by: Family member   caregiver Pam   Pertinent History R Basal Ganglia CVA, Hyperlipidemia, Hypothyroidism, Asthma, Glaucoma, HTN    Limitations Standing;Walking    Patient Stated Goals Be able to walk;    Currently in Pain? No/denies            [facility] Adult PT Treatment/Exercise - 02/03/21 0001      Transfers   Transfers Sit to Stand;Stand to Sit    Sit to Stand 4: Min guard;With upper extremity assist    Five time sit to stand comments  21.31 secs with UE  support from standard    Stand to Sit 4: Min guard;With upper extremity assist;Uncontrolled descent    Comments continue to utilize one hand on RW and one hand to push up from surface      Ambulation/Gait   Ambulation/Gait Yes    Ambulation/Gait Assistance 5: Supervision;4: Min guard    Ambulation/Gait Assistance Details intermittent supervision/CGA with ambulation. continue to provide verbal cues for improved step length on RLE.    Ambulation Distance (Feet) 230 Feet    Assistive device Rolling walker    Gait Pattern Step-to pattern;Step-through pattern;Decreased step length - right;Decreased step length - left;Decreased stance time - left;Decreased hip/knee flexion - left;Decreased stride length;Decreased weight shift to left;Left flexed knee in stance;Antalgic;Trunk flexed;Narrow base of support    Ambulation Surface Level;Indoor      Standardized Balance Assessment   Standardized Balance Assessment Timed Up and Go Test      Timed Up and Go Test   TUG Normal TUG    Normal TUG (seconds) 42.16   with RW     Exercises   Exercises Knee/Hip      Knee/Hip Exercises: Aerobic   Other Aerobic Scifit level 2.0 for 6 minutes with BLEs only for strengthening and activity tolerance. intermittent assist to keep LLE  in proper position during completion.             Balance Exercises - 02/03/21 0001      Balance Exercises: Standing   Standing Eyes Opened Wide (BOA);Foam/compliant surface;Limitations    Standing Eyes Opened Limitations standing on airex without UE support and wide BOS completed horizontal/vertical head turns x 10 reps each direction.    Rockerboard Anterior/posterior;Lateral;EO;Intermittent UE support;Limitations    Rockerboard Limitations performed both ways on rockerboard (A/P and lateral): completed standing without UE support working on [employer] with eyes open and maintaining board steady. patient often overcorrecting anterior sway to far posterior requiring Min  A from PT to maintin balance, completed 3 x 1 minute each with each position. increased challenge with A/P  noted today.    Other Standing Exercises Comments intermittent rest breaks required throughout activities             PT Education - 02/03/21 1356    Education Details progress toward TUG and 5x sit <> stand    Person(s) Educated Patient    Methods Explanation    Comprehension Verbalized understanding            PT Short Term Goals - 01/20/21 1423      PT SHORT TERM GOAL #1   Title = LTGs             PT Long Term Goals - 01/20/21 1424      PT LONG TERM GOAL #1   Title Patient will be independent with progressive final HEP with caregiver assistance, and report compliance with daily walking within the home with assistance (ALL LTGs Due: 02/17/21)    Baseline patient continues to benefit from progressive HEP    Time 4    Period Weeks    Status Revised    Target Date 02/17/21      PT LONG TERM GOAL #2   Title Patient will be able to ambulate >/= 200 ft on indoor level surfaces with supervision and LRAD to demonstrate improved household mobility    Baseline 01/18/21: 230' with RW and CGA    Time 4    Period Weeks    Status Revised      PT LONG TERM GOAL #3   Title Patient will completed 5x sit <> stand in </= 15 seconds to demonstrate reduced fall risk and improved balance    Baseline 01/18/21: 22.04 sec's with UE support.    Time 4    Period Weeks    Status Revised      PT LONG TERM GOAL #4   Title Patient will improve TUG to </= 30 seconds to demonstrate reduced fall risk and improved balance    Baseline 01/18/21: 44.68 sec's wth RW/brace on left LE    Time 4    Period Weeks    Status Revised      PT LONG TERM GOAL #5   Title Patient will improve gait speed to >/= 0.80 ft/sec to demonstrate improved tolerance for household mobility    Baseline 01/18/21: 45.81 seconds = .72 ft/sec with RW, improved from 0.58 ft/sec just not to goal level    Time 4    Status  On-going      Additional Long Term Goals   Additional Long Term Goals Yes      PT LONG TERM GOAL #6   Title Patient will be able to ascend/descend x 12 stairs with B rails and CGA to demonstrate improved safety with household mobiltity  Baseline CGA to Min A with stair negotiation    Time 4    Period Weeks    Status New                 Plan - 02/03/21 1402    Clinical Impression Statement Assesed TUG and 5x sit <> stand for progress note. Patient improved 5x sit <> stand to 21.31 secs, and TUG to 42.216 seconds demosntrating slow steady progress with PT services. Continued rest of session focused on continued gait and balance training, increased challenge noted on rockerboard today. Will continue to progress toward all LTGs.    Personal Factors and Comorbidities Comorbidity 3+;Time since onset of injury/illness/exacerbation;Age    Comorbidities R Basal Ganglia CVA, Hyperlipidemia, Hypothyroidism, Asthma, Glaucoma, HTN    Examination-Activity Limitations Bed Mobility;Dressing;Locomotion Level;Reach Overhead;Stairs;Stand;Transfers    Examination-Participation Restrictions Psychiatric nurse Evolving/Moderate complexity    Rehab Potential Fair    PT Frequency 2x / week    PT Duration 4 weeks    PT Treatment/Interventions ADLs/Self Care Home Management;Cryotherapy;Electrical Stimulation;Moist Heat;DME Instruction;Gait training;Stair training;Functional mobility training;Therapeutic activities;Therapeutic exercise;Balance training;Neuromuscular re-education;Patient/family education;Orthotic Fit/Training;Manual techniques;Passive range of motion    PT Next Visit Plan continued to work on gait with RW, stairs and standing balance/strengthening.    PT Home Exercise Plan Access Code: [email or web address]    Consulted and Agree with Plan of Care Patient           Patient will benefit from skilled therapeutic intervention in order to improve the  following deficits and impairments:  Abnormal gait,Decreased balance,Decreased mobility,Decreased endurance,Difficulty walking,Impaired tone,Impaired sensation,Pain,Decreased strength,Decreased safety awareness,Decreased knowledge of use of DME,Decreased coordination,Decreased activity tolerance,Decreased range of motion  Visit Diagnosis: Muscle weakness (generalized)  Difficulty in walking, not elsewhere classified  Unsteadiness on feet  Other abnormalities of gait and mobility     Problem List Patient Active Problem List   Diagnosis Date Noted  . Spasticity 11/30/2020  . Wheelchair dependence 10/07/2020  . Left knee pain 07/26/2020  . Cerebrovascular accident (CVA) of right basal ganglia (HCC) 07/26/2020  . Left hemiparesis (HCC) 07/25/2020  . Hypokalemia 07/20/2020  . Right basal ganglia embolic stroke (HCC) 07/20/2020  . Essential hypertension 07/20/2020  . Hypothyroidism 07/20/2020    Tempie Donning, PT, DPT 02/03/2021, 3:12 PM  [facility] [address] [place], [place], [address] Phone: [phone]   Fax:  [phone]  Name: Nicole [name] MRN: [id number] Date of Birth: October 02, 1930

## 2021-02-08 ENCOUNTER — Encounter: Payer: Self-pay | Admitting: Occupational Therapy

## 2021-02-08 ENCOUNTER — Ambulatory Visit: Payer: Medicare Other | Admitting: Physical Therapy

## 2021-02-08 ENCOUNTER — Ambulatory Visit: Payer: Medicare Other | Admitting: Occupational Therapy

## 2021-02-08 ENCOUNTER — Encounter: Payer: Self-pay | Admitting: Physical Therapy

## 2021-02-08 ENCOUNTER — Other Ambulatory Visit: Payer: Self-pay

## 2021-02-08 DIAGNOSIS — I69354 Hemiplegia and hemiparesis following cerebral infarction affecting left non-dominant side: Secondary | ICD-10-CM

## 2021-02-08 DIAGNOSIS — M6281 Muscle weakness (generalized): Secondary | ICD-10-CM

## 2021-02-08 DIAGNOSIS — M25612 Stiffness of left shoulder, not elsewhere classified: Secondary | ICD-10-CM

## 2021-02-08 DIAGNOSIS — R278 Other lack of coordination: Secondary | ICD-10-CM | POA: Diagnosis not present

## 2021-02-08 DIAGNOSIS — R2681 Unsteadiness on feet: Secondary | ICD-10-CM

## 2021-02-08 DIAGNOSIS — R2689 Other abnormalities of gait and mobility: Secondary | ICD-10-CM | POA: Diagnosis not present

## 2021-02-08 DIAGNOSIS — M25512 Pain in left shoulder: Secondary | ICD-10-CM | POA: Diagnosis not present

## 2021-02-08 DIAGNOSIS — R262 Difficulty in walking, not elsewhere classified: Secondary | ICD-10-CM | POA: Diagnosis not present

## 2021-02-08 NOTE — Therapy (Signed)
[facility] [address] [place], [place], [address] Phone: [phone]   Fax:  [phone]  Occupational Therapy Treatment  Patient Details  Name: Nicole [name] MRN: [id number] Date of Birth: [birth year] Referring Provider (OT): Genice Rouge, MD   Encounter Date: 02/08/2021   OT End of Session - 02/08/21 1234    Visit Number 26    Number of Visits 31   +8 visits at renewal   Date for OT Re-Evaluation 02/24/21    Authorization Type BCBS Medicare    Authorization Time Period $40 Copay VL:MN No Auth required  10th visit PN     Authorization - Visit Number 26    Authorization - Number of Visits 33    Progress Note Due on Visit 33    OT Start Time 1234    OT Stop Time 1315    OT Time Calculation (min) 41 min    Activity Tolerance Patient tolerated treatment well    Behavior During Therapy [facility] for tasks assessed/performed           Past Medical History:  Diagnosis Date  . Hypertension   . Stroke [facility])    September 2021  . Thyroid disease     History reviewed. No pertinent surgical history.  There were no vitals filed for this visit.   Subjective Assessment - 02/08/21 1234    Subjective  Pt denies any pain. Pt reports she is "folding clothes and picking up things"    Pertinent History CVA R basal ganglia, HTN, HLD, hypothyroidism, asthma, glaucoma    Limitations fall risk. hard of hearing    Currently in Pain? No/denies           TREATMENT:   A/AROM with LUE chest press and shoulder flexion x 12   Functional Reaching: with LUE at varyious heights and levels with obtaining Cones from OT and placing to left on mat with verbal and visual cues for abduction and rotation. Pt then picked up cones with min A. Reaching up with LUE to approximately eye level for placing medium pegs with LUE to vertical surface. Pt req'd mod/max assistance at elbow for stability and weakness.   Stringing Beads: for bimanual  coordination and working on increasing fine motor coordination with LUE. Verbal cues for incorporating LUE more with activity.  ADLs:b Tying Shoes with mod I. Pt unable to don AFO per report.           OT Short Term Goals - 01/27/21 1319      OT SHORT TERM GOAL #1   Title Pt will be independent with HEP (12/07/20)    Time 4    Period Weeks    Status Achieved    Target Date 12/07/20      OT SHORT TERM GOAL #2   Title Pt will improve grip strength in LUE by 3 lbs or greater for increase in functional use of LUE and increase independence with clothing management.    Baseline RUE 42.1 LUE 26.8    Time 4    Period Weeks    Status Achieved   37.9 LUE     OT SHORT TERM GOAL #3   Title Pt will obtain item from mid level shelf with increase in shoulder flexion to 70 degrees with good positioning and reduction in shoulder hiking and compensatory movements.    Baseline LUE 66 degrees sh flexion    Time 4    Period Weeks    Status Achieved  90* on 12/23/2020     OT SHORT TERM GOAL #4   Title Pt will demonstrate improved box and blocks score to 35 or greater with LUE for increase in functional use of LUE.    Baseline 29 LUE    Time 4    Period Weeks    Status Achieved   01/25/21 35 blocks     OT SHORT TERM GOAL #5   Title Pt will verbalize understanding of sleep positions for LUE to decrease pain.    Baseline 6/10 LUE shoulder pain at night    Time 4    Period Weeks    Status Achieved      OT SHORT TERM GOAL #6   Title Pt will don bra with mod I    Baseline daughter currently assisting    Time 4    Period Weeks    Status Achieved   12/07/20 - pt reports donning bra all clothing     OT SHORT TERM GOAL #7   Title --    Time --    Period --    Status --             OT Long Term Goals - 01/27/21 1319      OT LONG TERM GOAL #1   Title Pt will be independent with updated HEP 01/04/2021    Time 8    Period Weeks    Status On-going   pt completing shoulder HEPs - ongoing  to update PRN     OT LONG TERM GOAL #2   Title Pt will demonstrate increased grip strength in LUE to 33 lbs or greater for improved functional use. UPDATE: 37 lbs or greater    Baseline RUE 42.1 LUE 26.8    Time 8    Period Weeks    Status Achieved   42.9 lbs on 01/25/21     OT LONG TERM GOAL #3   Title Pt will perform UB and LB dressing with mod I.    Time 8    Period Weeks    Status Achieved   pt and caregiver report pt is doing all dressing except shower days     OT LONG TERM GOAL #4   Title Pt will increase fine motor coordination by completing 9 hole peg test in 50 seconds or less with LUE. Upgraded 12/23/2020: 45seconds or less with LUE UPDATE: 38s or less with L elbow on table    Baseline RUE 27.47 LUE 61.87    Time 8    Period Weeks    Status On-going   01/25/21 40s with LUE     OT LONG TERM GOAL #5   Title Pt will improve shoulder flexion to at least 100 degrees in order to obtain object and increase functional reach with minimal shoulder hiking and/or lateral flexion    Baseline LUE 66 degrees sh flexion    Time 8    Period Weeks    Status Revised   110* with compensatory movements on 01/25/21     OT LONG TERM GOAL #6   Title Pt will report decrease in pain in LUE shoulder at night to less than or equal to 4/10 with improved sleep positioning and range of motion    Time 8    Period Weeks    Status Achieved      OT LONG TERM GOAL #7   Title Pt will report using LUE for functional activities at least 50% of the time for bimanual  tasks    Time 4    Period Weeks    Status New    Target Date 02/24/21                 Plan - 02/08/21 1312    Clinical Impression Statement Pt is progressing towards goals with improving LUE functional use.    OT Occupational Profile and History Problem Focused Assessment - Including review of records relating to presenting problem    Occupational performance deficits (Please refer to evaluation for details): ADL's;IADL's    Body  Structure / Function / Physical Skills ADL;Balance;Coordination;IADL;Pain;GMC;Hearing;Strength;Tone;Sensation;Dexterity;Decreased knowledge of use of DME;FMC;UE functional use;ROM;Flexibility    Cognitive Skills Attention;Sequencing;Problem Solve    OT Frequency 2x / week    OT Duration 4 weeks   renewal completed for 2x/week for 4 weeks   OT Treatment/Interventions Self-care/ADL training;Cryotherapy;Ultrasound;Moist Heat;Electrical Stimulation;Paraffin;Energy conservation;DME and/or AE instruction;Manual Therapy;Functional Mobility Training;Neuromuscular education;Therapeutic exercise;Visual/perceptual remediation/compensation;Patient/family education;Balance training;Therapeutic activities;Passive range of motion;Cognitive remediation/compensation    Plan LUE NMR,    Consulted and Agree with Plan of Care Family member/caregiver;Patient    Family Member Consulted caregiever Pam           Patient will benefit from skilled therapeutic intervention in order to improve the following deficits and impairments:   Body Structure / Function / Physical Skills: ADL,Balance,Coordination,IADL,Pain,GMC,Hearing,Strength,Tone,Sensation,Dexterity,Decreased knowledge of use of DME,FMC,UE functional use,ROM,Flexibility Cognitive Skills: Attention,Sequencing,Problem Solve     Visit Diagnosis: Hemiplegia and hemiparesis following cerebral infarction affecting left non-dominant side (HCC)  Muscle weakness (generalized)  Stiffness of left shoulder, not elsewhere classified  Other lack of coordination  Unsteadiness on feet    Problem List Patient Active Problem List   Diagnosis Date Noted  . Spasticity 11/30/2020  . Wheelchair dependence 10/07/2020  . Left knee pain 07/26/2020  . Cerebrovascular accident (CVA) of right basal ganglia (HCC) 07/26/2020  . Left hemiparesis (HCC) 07/25/2020  . Hypokalemia 07/20/2020  . Right basal ganglia embolic stroke (HCC) 07/20/2020  . Essential hypertension  07/20/2020  . Hypothyroidism 07/20/2020    Junious Dresser MOT, OTR/L  02/08/2021, 1:13 PM  [facility] [address] [place], [place], [address] Phone: [phone]   Fax:  [phone]  Name: Nicole [name] MRN: [id number] Date of Birth: 13-May-1930

## 2021-02-08 NOTE — Therapy (Signed)
[facility] [address] [place], [place], [address] Phone: [phone]   Fax:  [phone]  Physical Therapy Treatment  Patient Details  Name: Nicole [name] MRN: [id number] Date of Birth: 1930-05-17 Referring Provider (PT): Genice Rouge, MD   Encounter Date: 02/08/2021   PT End of Session - 02/08/21 1320    Visit Number 30    Number of Visits 33    Date for PT Re-Evaluation 03/21/21   updated POC for 4 weeks, Cert for 60 days   Authorization Type BCBS Medicare    Progress Note Due on Visit 39   (Progress Note completed on 19th visit)   PT Start Time 1318    PT Stop Time 1400    PT Time Calculation (min) 42 min    Equipment Utilized During Treatment Gait belt    Activity Tolerance Patient tolerated treatment well    Behavior During Therapy WFL for tasks assessed/performed           Past Medical History:  Diagnosis Date  . Hypertension   . Stroke [facility])    September 2021  . Thyroid disease     History reviewed. No pertinent surgical history.  There were no vitals filed for this visit.   Subjective Assessment - 02/08/21 1319    Subjective No new changes/complaints. No falls to report. No pain. Has not done the stairs lately.    Patient is accompained by: Family member   caregiver Pam   Pertinent History R Basal Ganglia CVA, Hyperlipidemia, Hypothyroidism, Asthma, Glaucoma, HTN    Limitations Standing;Walking    Patient Stated Goals Be able to walk;    Currently in Pain? No/denies    Pain Score 0-No pain               [facility] Adult PT Treatment/Exercise - 02/08/21 1321      Transfers   Transfers Sit to Stand;Stand to Sit    Sit to Stand 4: Min guard;With upper extremity assist    Stand to Sit 4: Min guard;With upper extremity assist;Uncontrolled descent      Ambulation/Gait   Ambulation/Gait Yes    Ambulation/Gait Assistance 5: Supervision;4: Min guard    Ambulation Distance (Feet) 230 Feet   x1,  plus around gym with session   Assistive device Rolling walker;Other (Comment)   left AFO   Gait Pattern Step-to pattern;Step-through pattern;Decreased step length - right;Decreased step length - left;Decreased stance time - left;Decreased hip/knee flexion - left;Decreased stride length;Decreased weight shift to left;Left flexed knee in stance;Antalgic;Trunk flexed;Narrow base of support    Ambulation Surface Level;Indoor      High Level Balance   High Level Balance Activities Side stepping;Marching forwards;Backward walking    High Level Balance Comments on blue mat in parallel bars with light UE support fo 3 laps each way. cues on posure, weight shifting and ex technique. min guard to min assist for balance/safety.               Balance Exercises - 02/08/21 1342      Balance Exercises: Standing   Standing Eyes Closed Wide (BOA);Foam/compliant surface;Other reps (comment);30 secs;Limitations    Standing Eyes Closed Limitations on airex with no UE support: feet together for EC 30 sec's x3 reps, then after a short rest break, with feet apart for EC head movements left<>right, up<>down for ~10 reps each. min guard assist for balance with no UE support.  PT Short Term Goals - 01/20/21 1423      PT SHORT TERM GOAL #1   Title = LTGs             PT Long Term Goals - 01/20/21 1424      PT LONG TERM GOAL #1   Title Patient will be independent with progressive final HEP with caregiver assistance, and report compliance with daily walking within the home with assistance (ALL LTGs Due: 02/17/21)    Baseline patient continues to benefit from progressive HEP    Time 4    Period Weeks    Status Revised    Target Date 02/17/21      PT LONG TERM GOAL #2   Title Patient will be able to ambulate >/= 200 ft on indoor level surfaces with supervision and LRAD to demonstrate improved household mobility    Baseline 01/18/21: 230' with RW and CGA    Time 4    Period Weeks     Status Revised      PT LONG TERM GOAL #3   Title Patient will completed 5x sit <> stand in </= 15 seconds to demonstrate reduced fall risk and improved balance    Baseline 01/18/21: 22.04 sec's with UE support.    Time 4    Period Weeks    Status Revised      PT LONG TERM GOAL #4   Title Patient will improve TUG to </= 30 seconds to demonstrate reduced fall risk and improved balance    Baseline 01/18/21: 44.68 sec's wth RW/brace on left LE    Time 4    Period Weeks    Status Revised      PT LONG TERM GOAL #5   Title Patient will improve gait speed to >/= 0.80 ft/sec to demonstrate improved tolerance for household mobility    Baseline 01/18/21: 45.81 seconds = .72 ft/sec with RW, improved from 0.58 ft/sec just not to goal level    Time 4    Status On-going      Additional Long Term Goals   Additional Long Term Goals Yes      PT LONG TERM GOAL #6   Title Patient will be able to ascend/descend x 12 stairs with B rails and CGA to demonstrate improved safety with household mobiltity    Baseline CGA to Min A with stair negotiation    Time 4    Period Weeks    Status New                 Plan - 02/08/21 1320    Clinical Impression Statement Today's skilled session continued to focus on gait with RW/brace and balance training on compliant surfaces. No issues noted or reported in session today. The pt is progressing toward goals and should benefit from continued PT to progress toward unmet goals.    Personal Factors and Comorbidities Comorbidity 3+;Time since onset of injury/illness/exacerbation;Age    Comorbidities R Basal Ganglia CVA, Hyperlipidemia, Hypothyroidism, Asthma, Glaucoma, HTN    Examination-Activity Limitations Bed Mobility;Dressing;Locomotion Level;Reach Overhead;Stairs;Stand;Transfers    Examination-Participation Restrictions Psychiatric nurse Evolving/Moderate complexity    Rehab Potential Fair    PT Frequency 2x /  week    PT Duration 4 weeks    PT Treatment/Interventions ADLs/Self Care Home Management;Cryotherapy;Electrical Stimulation;Moist Heat;DME Instruction;Gait training;Stair training;Functional mobility training;Therapeutic activities;Therapeutic exercise;Balance training;Neuromuscular re-education;Patient/family education;Orthotic Fit/Training;Manual techniques;Passive range of motion    PT Next Visit Plan continued to work on gait with  RW, stairs and standing balance/strengthening.    PT Home Exercise Plan Access Code: [email or web address]    Consulted and Agree with Plan of Care Patient           Patient will benefit from skilled therapeutic intervention in order to improve the following deficits and impairments:  Abnormal gait,Decreased balance,Decreased mobility,Decreased endurance,Difficulty walking,Impaired tone,Impaired sensation,Pain,Decreased strength,Decreased safety awareness,Decreased knowledge of use of DME,Decreased coordination,Decreased activity tolerance,Decreased range of motion  Visit Diagnosis: Muscle weakness (generalized)  Unsteadiness on feet  Other abnormalities of gait and mobility     Problem List Patient Active Problem List   Diagnosis Date Noted  . Spasticity 11/30/2020  . Wheelchair dependence 10/07/2020  . Left knee pain 07/26/2020  . Cerebrovascular accident (CVA) of right basal ganglia (HCC) 07/26/2020  . Left hemiparesis (HCC) 07/25/2020  . Hypokalemia 07/20/2020  . Right basal ganglia embolic stroke (HCC) 07/20/2020  . Essential hypertension 07/20/2020  . Hypothyroidism 07/20/2020    Sallyanne Kuster, PTA, [facility] [address], [address] [place], [place] [address] [phone] 02/08/21, 10:36 PM   Name: Nicole [name] MRN: [id number] Date of Birth: 11-Aug-1930

## 2021-02-09 DIAGNOSIS — H1013 Acute atopic conjunctivitis, bilateral: Secondary | ICD-10-CM | POA: Diagnosis not present

## 2021-02-09 DIAGNOSIS — R519 Headache, unspecified: Secondary | ICD-10-CM | POA: Diagnosis not present

## 2021-02-09 DIAGNOSIS — I10 Essential (primary) hypertension: Secondary | ICD-10-CM | POA: Diagnosis not present

## 2021-02-09 DIAGNOSIS — J309 Allergic rhinitis, unspecified: Secondary | ICD-10-CM | POA: Diagnosis not present

## 2021-02-10 ENCOUNTER — Ambulatory Visit: Payer: Medicare Other | Attending: Internal Medicine

## 2021-02-10 ENCOUNTER — Encounter: Payer: Self-pay | Admitting: Physical Therapy

## 2021-02-10 ENCOUNTER — Encounter: Payer: Self-pay | Admitting: Occupational Therapy

## 2021-02-10 ENCOUNTER — Ambulatory Visit: Payer: Medicare Other | Admitting: Occupational Therapy

## 2021-02-10 ENCOUNTER — Other Ambulatory Visit: Payer: Self-pay

## 2021-02-10 ENCOUNTER — Ambulatory Visit: Payer: Medicare Other | Admitting: Physical Therapy

## 2021-02-10 DIAGNOSIS — M6281 Muscle weakness (generalized): Secondary | ICD-10-CM

## 2021-02-10 DIAGNOSIS — M25612 Stiffness of left shoulder, not elsewhere classified: Secondary | ICD-10-CM | POA: Diagnosis not present

## 2021-02-10 DIAGNOSIS — R2689 Other abnormalities of gait and mobility: Secondary | ICD-10-CM | POA: Diagnosis not present

## 2021-02-10 DIAGNOSIS — M25512 Pain in left shoulder: Secondary | ICD-10-CM | POA: Diagnosis not present

## 2021-02-10 DIAGNOSIS — R2681 Unsteadiness on feet: Secondary | ICD-10-CM

## 2021-02-10 DIAGNOSIS — Z23 Encounter for immunization: Secondary | ICD-10-CM

## 2021-02-10 DIAGNOSIS — I69354 Hemiplegia and hemiparesis following cerebral infarction affecting left non-dominant side: Secondary | ICD-10-CM | POA: Diagnosis not present

## 2021-02-10 DIAGNOSIS — R262 Difficulty in walking, not elsewhere classified: Secondary | ICD-10-CM | POA: Diagnosis not present

## 2021-02-10 DIAGNOSIS — R278 Other lack of coordination: Secondary | ICD-10-CM | POA: Diagnosis not present

## 2021-02-10 NOTE — Therapy (Signed)
[facility] [address] [place], [place], [address] Phone: [phone]   Fax:  [phone]  Occupational Therapy Treatment  Patient Details  Name: Nicole [name] MRN: [id number] Date of Birth: 24-Jun-1930 Referring Provider (OT): Genice Rouge, MD   Encounter Date: 02/10/2021   OT End of Session - 02/10/21 1230    Visit Number 27    Number of Visits 31   +8 visits at renewal   Date for OT Re-Evaluation 02/24/21    Authorization Type BCBS Medicare    Authorization Time Period $40 Copay VL:MN No Auth required  10th visit PN     Authorization - Visit Number 27    Authorization - Number of Visits 33    Progress Note Due on Visit 33    OT Start Time 1227    OT Stop Time 1315    OT Time Calculation (min) 48 min    Activity Tolerance Patient tolerated treatment well    Behavior During Therapy [facility] for tasks assessed/performed           Past Medical History:  Diagnosis Date  . Hypertension   . Stroke [facility])    September 2021  . Thyroid disease     History reviewed. No pertinent surgical history.  There were no vitals filed for this visit.   Subjective Assessment - 02/10/21 1230    Subjective  Pt reports no changes and denies pain.    Pertinent History CVA R basal ganglia, HTN, HLD, hypothyroidism, asthma, glaucoma    Limitations fall risk. hard of hearing    Currently in Pain? No/denies              TREATMENT:  Self Care: folding laundry with bimanual coordination and incorporating 2 hands for daily tasks. Pt required min verbal cues for bigger movements with LUE.   Functional Reaching with LUE and placing connect 4 chips into frame/board with cues to "think big" and move hand in arc motion to decrease shoulder hiking. Functional reaching on sliding board at incline at approx 60 degrees and sliding bean bags with LUE with mod facilitation for decreasing shoulder hiking and lateral flexion.                     OT Short Term Goals - 01/27/21 1319      OT SHORT TERM GOAL #1   Title Pt will be independent with HEP (12/07/20)    Time 4    Period Weeks    Status Achieved    Target Date 12/07/20      OT SHORT TERM GOAL #2   Title Pt will improve grip strength in LUE by 3 lbs or greater for increase in functional use of LUE and increase independence with clothing management.    Baseline RUE 42.1 LUE 26.8    Time 4    Period Weeks    Status Achieved   37.9 LUE     OT SHORT TERM GOAL #3   Title Pt will obtain item from mid level shelf with increase in shoulder flexion to 70 degrees with good positioning and reduction in shoulder hiking and compensatory movements.    Baseline LUE 66 degrees sh flexion    Time 4    Period Weeks    Status Achieved   90* on 12/23/2020     OT SHORT TERM GOAL #4   Title Pt will demonstrate improved box and blocks score to 35 or greater with LUE for  increase in functional use of LUE.    Baseline 29 LUE    Time 4    Period Weeks    Status Achieved   01/25/21 35 blocks     OT SHORT TERM GOAL #5   Title Pt will verbalize understanding of sleep positions for LUE to decrease pain.    Baseline 6/10 LUE shoulder pain at night    Time 4    Period Weeks    Status Achieved      OT SHORT TERM GOAL #6   Title Pt will don bra with mod I    Baseline daughter currently assisting    Time 4    Period Weeks    Status Achieved   12/07/20 - pt reports donning bra all clothing     OT SHORT TERM GOAL #7   Title --    Time --    Period --    Status --             OT Long Term Goals - 01/27/21 1319      OT LONG TERM GOAL #1   Title Pt will be independent with updated HEP 01/04/2021    Time 8    Period Weeks    Status On-going   pt completing shoulder HEPs - ongoing to update PRN     OT LONG TERM GOAL #2   Title Pt will demonstrate increased grip strength in LUE to 33 lbs or greater for improved functional use. UPDATE: 37 lbs or greater     Baseline RUE 42.1 LUE 26.8    Time 8    Period Weeks    Status Achieved   42.9 lbs on 01/25/21     OT LONG TERM GOAL #3   Title Pt will perform UB and LB dressing with mod I.    Time 8    Period Weeks    Status Achieved   pt and caregiver report pt is doing all dressing except shower days     OT LONG TERM GOAL #4   Title Pt will increase fine motor coordination by completing 9 hole peg test in 50 seconds or less with LUE. Upgraded 12/23/2020: 45seconds or less with LUE UPDATE: 38s or less with L elbow on table    Baseline RUE 27.47 LUE 61.87    Time 8    Period Weeks    Status On-going   01/25/21 40s with LUE     OT LONG TERM GOAL #5   Title Pt will improve shoulder flexion to at least 100 degrees in order to obtain object and increase functional reach with minimal shoulder hiking and/or lateral flexion    Baseline LUE 66 degrees sh flexion    Time 8    Period Weeks    Status Revised   110* with compensatory movements on 01/25/21     OT LONG TERM GOAL #6   Title Pt will report decrease in pain in LUE shoulder at night to less than or equal to 4/10 with improved sleep positioning and range of motion    Time 8    Period Weeks    Status Achieved      OT LONG TERM GOAL #7   Title Pt will report using LUE for functional activities at least 50% of the time for bimanual tasks    Time 4    Period Weeks    Status New    Target Date 02/24/21  Plan - 02/10/21 1239    Clinical Impression Statement Pt is progressing towards goals with improving LUE functional use.    OT Occupational Profile and History Problem Focused Assessment - Including review of records relating to presenting problem    Occupational performance deficits (Please refer to evaluation for details): ADL's;IADL's    Body Structure / Function / Physical Skills ADL;Balance;Coordination;IADL;Pain;GMC;Hearing;Strength;Tone;Sensation;Dexterity;Decreased knowledge of use of DME;FMC;UE functional  use;ROM;Flexibility    Cognitive Skills Attention;Sequencing;Problem Solve    OT Frequency 2x / week    OT Duration 4 weeks   renewal completed for 2x/week for 4 weeks   OT Treatment/Interventions Self-care/ADL training;Cryotherapy;Ultrasound;Moist Heat;Electrical Stimulation;Paraffin;Energy conservation;DME and/or AE instruction;Manual Therapy;Functional Mobility Training;Neuromuscular education;Therapeutic exercise;Visual/perceptual remediation/compensation;Patient/family education;Balance training;Therapeutic activities;Passive range of motion;Cognitive remediation/compensation    Plan LUE NMR,    Consulted and Agree with Plan of Care Family member/caregiver;Patient    Family Member Consulted caregiever Pam           Patient will benefit from skilled therapeutic intervention in order to improve the following deficits and impairments:   Body Structure / Function / Physical Skills: ADL,Balance,Coordination,IADL,Pain,GMC,Hearing,Strength,Tone,Sensation,Dexterity,Decreased knowledge of use of DME,FMC,UE functional use,ROM,Flexibility Cognitive Skills: Attention,Sequencing,Problem Solve     Visit Diagnosis: Hemiplegia and hemiparesis following cerebral infarction affecting left non-dominant side (HCC)  Muscle weakness (generalized)  Stiffness of left shoulder, not elsewhere classified  Other lack of coordination  Unsteadiness on feet    Problem List Patient Active Problem List   Diagnosis Date Noted  . Spasticity 11/30/2020  . Wheelchair dependence 10/07/2020  . Left knee pain 07/26/2020  . Cerebrovascular accident (CVA) of right basal ganglia (HCC) 07/26/2020  . Left hemiparesis (HCC) 07/25/2020  . Hypokalemia 07/20/2020  . Right basal ganglia embolic stroke (HCC) 07/20/2020  . Essential hypertension 07/20/2020  . Hypothyroidism 07/20/2020    Junious Dresser MOT, OTR/L  02/10/2021, 2:49 PM  [facility] [address] [place], [place], [address] Phone: [phone]   Fax:  [phone]  Name: Nicole [name] MRN: [id number] Date of Birth: 1930-06-06

## 2021-02-10 NOTE — Progress Notes (Signed)
   Covid-19 Vaccination Clinic  Name:  AHUVA POYNOR    MRN: [id number] DOB: Apr 04, 1930  02/10/2021  Ms. Jawad was observed post Covid-19 immunization for 15 minutes without incident. She was provided with Vaccine Information Sheet and instruction to access the V-Safe system.   Ms. Ticer was instructed to call 911 with any severe reactions post vaccine: Marland Kitchen Difficulty breathing  . Swelling of face and throat  . A fast heartbeat  . A bad rash all over body  . Dizziness and weakness   Immunizations Administered    Name Date Dose VIS Date Route   PFIZER Comrnaty(Gray TOP) Covid-19 Vaccine 02/10/2021  9:56 AM 0.3 mL 10/13/2020 Intramuscular   Manufacturer: [employer], [employer]   Lot: [id number]   NDC: [phone]

## 2021-02-13 ENCOUNTER — Encounter: Payer: Self-pay | Admitting: Physical Therapy

## 2021-02-13 ENCOUNTER — Ambulatory Visit: Payer: Medicare Other | Admitting: Physical Therapy

## 2021-02-13 ENCOUNTER — Ambulatory Visit: Payer: Medicare Other | Admitting: Occupational Therapy

## 2021-02-13 ENCOUNTER — Other Ambulatory Visit: Payer: Self-pay

## 2021-02-13 ENCOUNTER — Encounter: Payer: Self-pay | Admitting: Occupational Therapy

## 2021-02-13 DIAGNOSIS — I69354 Hemiplegia and hemiparesis following cerebral infarction affecting left non-dominant side: Secondary | ICD-10-CM

## 2021-02-13 DIAGNOSIS — R278 Other lack of coordination: Secondary | ICD-10-CM

## 2021-02-13 DIAGNOSIS — M25512 Pain in left shoulder: Secondary | ICD-10-CM | POA: Diagnosis not present

## 2021-02-13 DIAGNOSIS — R262 Difficulty in walking, not elsewhere classified: Secondary | ICD-10-CM | POA: Diagnosis not present

## 2021-02-13 DIAGNOSIS — R2689 Other abnormalities of gait and mobility: Secondary | ICD-10-CM

## 2021-02-13 DIAGNOSIS — M6281 Muscle weakness (generalized): Secondary | ICD-10-CM | POA: Diagnosis not present

## 2021-02-13 DIAGNOSIS — M25612 Stiffness of left shoulder, not elsewhere classified: Secondary | ICD-10-CM | POA: Diagnosis not present

## 2021-02-13 DIAGNOSIS — R2681 Unsteadiness on feet: Secondary | ICD-10-CM

## 2021-02-13 NOTE — Therapy (Signed)
[facility] [address] [place], [place], [address] Phone: [phone]   Fax:  [phone]  Physical Therapy Treatment  Patient Details  Name: Nicole [name] MRN: [id number] Date of Birth: [birth year] Referring Provider (PT): Courtney Heys, MD   Encounter Date: 02/13/2021   PT End of Session - 02/13/21 1942    Visit Number 32    Number of Visits 33    Date for PT Re-Evaluation 03/21/21   updated POC for 4 weeks, Cert for 60 days   Authorization Type BCBS Medicare    Progress Note Due on Visit 39   (Progress Note completed on 19th visit)   PT Start Time 1230    PT Stop Time 1313    PT Time Calculation (min) 43 min    Equipment Utilized During Treatment Gait belt    Activity Tolerance Patient tolerated treatment well    Behavior During Therapy WFL for tasks assessed/performed           Past Medical History:  Diagnosis Date  . Hypertension   . Stroke [facility])    September 2021  . Thyroid disease     History reviewed. No pertinent surgical history.  There were no vitals filed for this visit.   Subjective Assessment - 02/13/21 1233    Subjective No changes since she was last here. Hasn't been doing the stairs recently.    Patient is accompained by: Family member   daughter dropped her off   Pertinent History R Basal Ganglia CVA, Hyperlipidemia, Hypothyroidism, Asthma, Glaucoma, HTN    Limitations Standing;Walking    Patient Stated Goals Be able to walk;    Currently in Pain? No/denies                             [facility] Adult PT Treatment/Exercise - 02/13/21 1244      Transfers   Transfers Sit to Stand;Stand to Sit    Sit to Stand 5: Supervision;With upper extremity assist;From chair/3-in-1    Five time sit to stand comments  21.1 seconds with UE support from mat at standard chair height    Stand to Sit 5: Supervision;With upper extremity assist;To chair/3-in-1    Comments x5 reps sit <>  stands on blue air ex, with UE support and cues for slowed descent      Ambulation/Gait   Ambulation/Gait Yes    Ambulation/Gait Assistance 5: Supervision    Ambulation/Gait Assistance Details cues for posture and trying to keep RW moving throughout    Ambulation Distance (Feet) 230 Feet    Assistive device Rolling walker;Other (Comment)    Gait Pattern Step-to pattern;Step-through pattern;Decreased step length - right;Decreased step length - left;Decreased stance time - left;Decreased hip/knee flexion - left;Decreased stride length;Decreased weight shift to left;Left flexed knee in stance;Antalgic;Trunk flexed;Narrow base of support    Ambulation Surface Level;Indoor    Gait velocity 49.13 seconds = .66 ft/sec with RW    Stairs Yes    Stairs Assistance 4: Min guard;4: Min assist    Stairs Assistance Details (indicate cue type and reason) incr assist when needing to descend    Stair Management Technique Two rails;Step to pattern;Forwards    Number of Stairs 4   x3 (needing seated rest break after 8 stairs)   Height of Stairs 6      Knee/Hip Exercises: Aerobic   Other Aerobic Scifit level 2.5 for 5 minutes with BLEs only for strengthening and  activity tolerance. intermittent assist to keep LLE in proper position during completion.                  PT Education - 02/13/21 1307    Education Details discussed D/C at next session.    Person(s) Educated Patient   caregiver   Methods Explanation    Comprehension Verbalized understanding            PT Short Term Goals - 01/20/21 1423      PT SHORT TERM GOAL #1   Title = LTGs             PT Long Term Goals - 02/13/21 1244      PT LONG TERM GOAL #1   Title Patient will be independent with progressive final HEP with caregiver assistance, and report compliance with daily walking within the home with assistance (ALL LTGs Due: 02/17/21)    Baseline patient continues to benefit from progressive HEP    Time 4    Period Weeks     Status Revised      PT LONG TERM GOAL #2   Title Patient will be able to ambulate >/= 200 ft on indoor level surfaces with supervision and LRAD to demonstrate improved household mobility    Baseline 200' with RW and supervision on 02/13/21    Time 4    Period Weeks    Status Achieved      PT LONG TERM GOAL #3   Title Patient will completed 5x sit <> stand in </= 15 seconds to demonstrate reduced fall risk and improved balance    Baseline 01/18/21: 22.04 sec's with UE support; on 02/13/21; 21.1 seconds with UE support from mat at standard chair height    Time 4    Period Weeks    Status Not Met      PT LONG TERM GOAL #4   Title Patient will improve TUG to </= 30 seconds to demonstrate reduced fall risk and improved balance    Baseline 01/18/21: 44.68 sec's wth RW/brace on left LE    Time 4    Period Weeks    Status Revised      PT LONG TERM GOAL #5   Title Patient will improve gait speed to >/= 0.80 ft/sec to demonstrate improved tolerance for household mobility    Baseline 49.13 seconds = .66 ft/sec with RW (previously .72 ft/sec)    Time 4    Status Not Met      PT LONG TERM GOAL #6   Title Patient will be able to ascend/descend x 12 stairs with B rails and CGA to demonstrate improved safety with household mobiltity    Baseline CGA to Min A with stair negotiation on 02/13/21, needing rest break after performing 8 stairs    Time 4    Period Weeks    Status Not Met                 Plan - 02/13/21 1946    Clinical Impression Statement Began to check LTGs for anticipated D/C at next session. Pt did not meet 3 out of 6 LTGs. Pt's gait speed with RW today was .66 ft/sec (previously .72 ft/sec). Pt's 5x sit <> stand stayed approx the same at 21 seconds compared to when last assessed a month ago. Pt able to perform 12 steps (with rest break in between reps) still with CGA/min A (esp when descending). Pt met LTG #2 - able to ambulate 200' with supervision  using RW. Discussed due  to a plateau in progress will D/C at next session, pt and pt's caregiver in agreement.    Personal Factors and Comorbidities Comorbidity 3+;Time since onset of injury/illness/exacerbation;Age    Comorbidities R Basal Ganglia CVA, Hyperlipidemia, Hypothyroidism, Asthma, Glaucoma, HTN    Examination-Activity Limitations Bed Mobility;Dressing;Locomotion Level;Reach Overhead;Stairs;Stand;Transfers    Examination-Participation Restrictions Armed forces logistics/support/administrative officer Evolving/Moderate complexity    Rehab Potential Fair    PT Frequency 2x / week    PT Duration 4 weeks    PT Treatment/Interventions ADLs/Self Care Home Management;Cryotherapy;Electrical Stimulation;Moist Heat;DME Instruction;Gait training;Stair training;Functional mobility training;Therapeutic activities;Therapeutic exercise;Balance training;Neuromuscular re-education;Patient/family education;Orthotic Fit/Training;Manual techniques;Passive range of motion    PT Next Visit Plan begin to check LTGs for anticipated discharge    PT Home Exercise Plan Access Code: [email or web address]    Consulted and Agree with Plan of Care Patient           Patient will benefit from skilled therapeutic intervention in order to improve the following deficits and impairments:  Abnormal gait,Decreased balance,Decreased mobility,Decreased endurance,Difficulty walking,Impaired tone,Impaired sensation,Pain,Decreased strength,Decreased safety awareness,Decreased knowledge of use of DME,Decreased coordination,Decreased activity tolerance,Decreased range of motion  Visit Diagnosis: Hemiplegia and hemiparesis following cerebral infarction affecting left non-dominant side (HCC)  Muscle weakness (generalized)  Other lack of coordination  Unsteadiness on feet     Problem List Patient Active Problem List   Diagnosis Date Noted  . Spasticity 11/30/2020  . Wheelchair dependence 10/07/2020  . Left knee pain 07/26/2020  .  Cerebrovascular accident (CVA) of right basal ganglia ([place]) 07/26/2020  . Left hemiparesis ([place]) 07/25/2020  . Hypokalemia 07/20/2020  . Right basal ganglia embolic stroke ([place]) 22/12/6689  . Essential hypertension 07/20/2020  . Hypothyroidism 07/20/2020    Arliss Journey, PT, DPT  02/13/2021, 7:46 PM  [facility] [address] [place], [place], [address] Phone: [phone]   Fax:  [phone]  Name: Nicole [name] MRN: [id number] Date of Birth: 07-14-1930

## 2021-02-13 NOTE — Therapy (Signed)
[facility] [address] [place], [place], [address] Phone: [phone]   Fax:  [phone]  Physical Therapy Treatment  Patient Details  Name: Nicole [name] MRN: [id number] Date of Birth: [birth year] Referring Provider (PT): Genice Rouge, MD   Encounter Date: 02/10/2021     02/10/21 1148  PT Visits / Re-Eval  Visit Number 31  Number of Visits 33  Date for PT Re-Evaluation 03/21/21 (updated POC for 4 weeks, Cert for 60 days)  Authorization  Authorization Type BCBS Medicare  Progress Note Due on Visit 39 ((Progress Note completed on 19th visit))  PT Time Calculation  PT Start Time 1146  PT Stop Time 1230  PT Time Calculation (min) 44 min  PT - End of Session  Equipment Utilized During Treatment Gait belt  Activity Tolerance Patient tolerated treatment well  Behavior During Therapy WFL for tasks assessed/performed    Past Medical History:  Diagnosis Date  . Hypertension   . Stroke [facility])    September 2021  . Thyroid disease     History reviewed. No pertinent surgical history.  There were no vitals filed for this visit.      02/10/21 1148  Symptoms/Limitations  Subjective No new changes/complaints. No falls to report. No pain.  Patient is accompained by: Family member (daughter dropped her off)  Pertinent History R Basal Ganglia CVA, Hyperlipidemia, Hypothyroidism, Asthma, Glaucoma, HTN  Limitations Standing;Walking  Patient Stated Goals Be able to walk;  Pain Assessment  Currently in Pain? No/denies  Pain Score 0       02/10/21 1148  Transfers  Transfers Sit to Stand;Stand to Sit  Sit to Stand 5: Supervision;With upper extremity assist;From chair/3-in-1  Stand to Sit 5: Supervision;With upper extremity assist;To chair/3-in-1  Ambulation/Gait  Ambulation/Gait Yes  Ambulation/Gait Assistance 5: Supervision;4: Min guard  Ambulation/Gait Assistance Details occasional cues for posture and walker  position with gait.  Ambulation Distance (Feet) 350 Feet (x1, plus around gym with session)  Assistive device Rolling walker;Other (Comment) (left AFO)  Gait Pattern Step-to pattern;Step-through pattern;Decreased step length - right;Decreased step length - left;Decreased stance time - left;Decreased hip/knee flexion - left;Decreased stride length;Decreased weight shift to left;Left flexed knee in stance;Antalgic;Trunk flexed;Narrow base of support  Ambulation Surface Level;Indoor  Knee/Hip Exercises: Aerobic  Other Aerobic Scifit level 2.5 for 8 minutes with BLEs only for strengthening and activity tolerance. intermittent assist to keep LLE in proper position during completion.        02/10/21 1155  Balance Exercises: Standing  Balance Beam standing across blue foam beam- alternating forward stepping to floor/back onto beam, then alternating backward stepping to floor/back onto beam for ~10 reps each with light UE support on bars          PT Short Term Goals - 01/20/21 1423      PT SHORT TERM GOAL #1   Title = LTGs             PT Long Term Goals - 01/20/21 1424      PT LONG TERM GOAL #1   Title Patient will be independent with progressive final HEP with caregiver assistance, and report compliance with daily walking within the home with assistance (ALL LTGs Due: 02/17/21)    Baseline patient continues to benefit from progressive HEP    Time 4    Period Weeks    Status Revised    Target Date 02/17/21      PT LONG TERM GOAL #2   Title Patient  will be able to ambulate >/= 200 ft on indoor level surfaces with supervision and LRAD to demonstrate improved household mobility    Baseline 01/18/21: 230' with RW and CGA    Time 4    Period Weeks    Status Revised      PT LONG TERM GOAL #3   Title Patient will completed 5x sit <> stand in </= 15 seconds to demonstrate reduced fall risk and improved balance    Baseline 01/18/21: 22.04 sec's with UE support.    Time 4    Period  Weeks    Status Revised      PT LONG TERM GOAL #4   Title Patient will improve TUG to </= 30 seconds to demonstrate reduced fall risk and improved balance    Baseline 01/18/21: 44.68 sec's wth RW/brace on left LE    Time 4    Period Weeks    Status Revised      PT LONG TERM GOAL #5   Title Patient will improve gait speed to >/= 0.80 ft/sec to demonstrate improved tolerance for household mobility    Baseline 01/18/21: 45.81 seconds = .72 ft/sec with RW, improved from 0.58 ft/sec just not to goal level    Time 4    Status On-going      Additional Long Term Goals   Additional Long Term Goals Yes      PT LONG TERM GOAL #6   Title Patient will be able to ascend/descend x 12 stairs with B rails and CGA to demonstrate improved safety with household mobiltity    Baseline CGA to Min A with stair negotiation    Time 4    Period Weeks    Status New              02/10/21 1148  Plan  Clinical Impression Statement Today's skilled session continued to focus on gait, strengthening and balance training. Pt able to increase gait distance this session.  Personal Factors and Comorbidities Comorbidity 3+;Time since onset of injury/illness/exacerbation;Age  Comorbidities R Basal Ganglia CVA, Hyperlipidemia, Hypothyroidism, Asthma, Glaucoma, HTN  Examination-Activity Limitations Bed Mobility;Dressing;Locomotion Level;Reach Overhead;Stairs;Stand;Transfers  Examination-Participation Restrictions Community Activity;Cleaning  Pt will benefit from skilled therapeutic intervention in order to improve on the following deficits Abnormal gait;Decreased balance;Decreased mobility;Decreased endurance;Difficulty walking;Impaired tone;Impaired sensation;Pain;Decreased strength;Decreased safety awareness;Decreased knowledge of use of DME;Decreased coordination;Decreased activity tolerance;Decreased range of motion  Stability/Clinical Decision Making Evolving/Moderate complexity  Rehab Potential Fair  PT Frequency  2x / week  PT Duration 4 weeks  PT Treatment/Interventions ADLs/Self Care Home Management;Cryotherapy;Electrical Stimulation;Moist Heat;DME Instruction;Gait training;Stair training;Functional mobility training;Therapeutic activities;Therapeutic exercise;Balance training;Neuromuscular re-education;Patient/family education;Orthotic Fit/Training;Manual techniques;Passive range of motion  PT Next Visit Plan begin to check LTGs for anticipated discharge  PT Home Exercise Plan Access Code: [email or web address]  Consulted and Agree with Plan of Care Patient         Patient will benefit from skilled therapeutic intervention in order to improve the following deficits and impairments:  Abnormal gait,Decreased balance,Decreased mobility,Decreased endurance,Difficulty walking,Impaired tone,Impaired sensation,Pain,Decreased strength,Decreased safety awareness,Decreased knowledge of use of DME,Decreased coordination,Decreased activity tolerance,Decreased range of motion  Visit Diagnosis: Muscle weakness (generalized)  Other abnormalities of gait and mobility  Difficulty in walking, not elsewhere classified     Problem List Patient Active Problem List   Diagnosis Date Noted  . Spasticity 11/30/2020  . Wheelchair dependence 10/07/2020  . Left knee pain 07/26/2020  . Cerebrovascular accident (CVA) of right basal ganglia (HCC) 07/26/2020  . Left hemiparesis (HCC) 07/25/2020  .  Hypokalemia 07/20/2020  . Right basal ganglia embolic stroke (HCC) 07/20/2020  . Essential hypertension 07/20/2020  . Hypothyroidism 07/20/2020    Sallyanne Kuster, PTA, [facility] [address], [address] [place], [place] [address] [phone] 02/13/21, 8:23 AM   Name: Nicole [name] MRN: [id number] Date of Birth: 1930/04/09

## 2021-02-13 NOTE — Therapy (Signed)
[facility] [address] [place], [place], [address] Phone: [phone]   Fax:  [phone]  Occupational Therapy Treatment  Patient Details  Name: Nicole [name] MRN: [id number] Date of Birth: 1929-11-22 Referring Provider (OT): Genice Rouge, MD   Encounter Date: 02/13/2021   OT End of Session - 02/13/21 1317    Visit Number 28    Number of Visits 31   +8 visits at renewal   Date for OT Re-Evaluation 02/24/21    Authorization Type BCBS Medicare    Authorization Time Period $40 Copay VL:MN No Auth required  10th visit PN     Authorization - Visit Number 28    Authorization - Number of Visits 33    Progress Note Due on Visit 33    OT Start Time 1316    OT Stop Time 1355    OT Time Calculation (min) 39 min    Activity Tolerance Patient tolerated treatment well    Behavior During Therapy WFL for tasks assessed/performed           Past Medical History:  Diagnosis Date  . Hypertension   . Stroke [facility])    September 2021  . Thyroid disease     History reviewed. No pertinent surgical history.  There were no vitals filed for this visit.   Subjective Assessment - 02/13/21 1317    Subjective  Pt denies any pain and denies any changes    Pertinent History CVA R basal ganglia, HTN, HLD, hypothyroidism, asthma, glaucoma    Limitations fall risk. hard of hearing    Currently in Pain? No/denies             TREATMENT:  Functional Reaching with LUE. Standing and reaching up to eye level with cones to table top. Pt with decreased compensatory movements while standing with SBA with RW.  Functional Mobility walking to table with RW and min guard  Grooved Pegs with LUE with increased time and mod difficulty with rotating pegs in LUE and min drops.                      OT Short Term Goals - 01/27/21 1319      OT SHORT TERM GOAL #1   Title Pt will be independent with HEP (12/07/20)    Time 4     Period Weeks    Status Achieved    Target Date 12/07/20      OT SHORT TERM GOAL #2   Title Pt will improve grip strength in LUE by 3 lbs or greater for increase in functional use of LUE and increase independence with clothing management.    Baseline RUE 42.1 LUE 26.8    Time 4    Period Weeks    Status Achieved   37.9 LUE     OT SHORT TERM GOAL #3   Title Pt will obtain item from mid level shelf with increase in shoulder flexion to 70 degrees with good positioning and reduction in shoulder hiking and compensatory movements.    Baseline LUE 66 degrees sh flexion    Time 4    Period Weeks    Status Achieved   90* on 12/23/2020     OT SHORT TERM GOAL #4   Title Pt will demonstrate improved box and blocks score to 35 or greater with LUE for increase in functional use of LUE.    Baseline 29 LUE    Time 4  Period Weeks    Status Achieved   01/25/21 35 blocks     OT SHORT TERM GOAL #5   Title Pt will verbalize understanding of sleep positions for LUE to decrease pain.    Baseline 6/10 LUE shoulder pain at night    Time 4    Period Weeks    Status Achieved      OT SHORT TERM GOAL #6   Title Pt will don bra with mod I    Baseline daughter currently assisting    Time 4    Period Weeks    Status Achieved   12/07/20 - pt reports donning bra all clothing     OT SHORT TERM GOAL #7   Title --    Time --    Period --    Status --             OT Long Term Goals - 02/13/21 1323      OT LONG TERM GOAL #1   Title Pt will be independent with updated HEP 01/04/2021    Time 8    Period Weeks    Status On-going   pt completing shoulder HEPs - ongoing to update PRN     OT LONG TERM GOAL #2   Title Pt will demonstrate increased grip strength in LUE to 33 lbs or greater for improved functional use. UPDATE: 37 lbs or greater    Baseline RUE 42.1 LUE 26.8    Time 8    Period Weeks    Status Achieved   42.9 lbs on 01/25/21     OT LONG TERM GOAL #3   Title Pt will perform UB and LB  dressing with mod I.    Time 8    Period Weeks    Status Achieved   pt and caregiver report pt is doing all dressing except shower days     OT LONG TERM GOAL #4   Title Pt will increase fine motor coordination by completing 9 hole peg test in 50 seconds or less with LUE. Upgraded 12/23/2020: 45seconds or less with LUE UPDATE: 38s or less with L elbow on table    Baseline RUE 27.47 LUE 61.87    Time 8    Period Weeks    Status On-going   01/25/21 40s with LUE     OT LONG TERM GOAL #5   Title Pt will improve shoulder flexion to at least 100 degrees in order to obtain object and increase functional reach with minimal shoulder hiking and/or lateral flexion    Baseline LUE 66 degrees sh flexion    Time 8    Period Weeks    Status Achieved   100* with minimal shoulder hiking and lateral flexing 02/13/21     OT LONG TERM GOAL #6   Title Pt will report decrease in pain in LUE shoulder at night to less than or equal to 4/10 with improved sleep positioning and range of motion    Time 8    Period Weeks    Status Achieved      OT LONG TERM GOAL #7   Title Pt will report using LUE for functional activities at least 50% of the time for bimanual tasks    Time 4    Period Weeks    Status New                 Plan - 02/13/21 1319    Clinical Impression Statement Pt is set to discharge  next session. Pt has improved with increased functional use and movement of LUE with occupational therapy.    OT Occupational Profile and History Problem Focused Assessment - Including review of records relating to presenting problem    Occupational performance deficits (Please refer to evaluation for details): ADL's;IADL's    Body Structure / Function / Physical Skills ADL;Balance;Coordination;IADL;Pain;GMC;Hearing;Strength;Tone;Sensation;Dexterity;Decreased knowledge of use of DME;FMC;UE functional use;ROM;Flexibility    Cognitive Skills Attention;Sequencing;Problem Solve    OT Frequency 2x / week    OT  Duration 4 weeks   renewal completed for 2x/week for 4 weeks   OT Treatment/Interventions Self-care/ADL training;Cryotherapy;Ultrasound;Moist Heat;Electrical Stimulation;Paraffin;Energy conservation;DME and/or AE instruction;Manual Therapy;Functional Mobility Training;Neuromuscular education;Therapeutic exercise;Visual/perceptual remediation/compensation;Patient/family education;Balance training;Therapeutic activities;Passive range of motion;Cognitive remediation/compensation    Plan LUE NMR, discharge next session    Consulted and Agree with Plan of Care Family member/caregiver;Patient    Family Member Consulted caregiever Pam           Patient will benefit from skilled therapeutic intervention in order to improve the following deficits and impairments:   Body Structure / Function / Physical Skills: ADL,Balance,Coordination,IADL,Pain,GMC,Hearing,Strength,Tone,Sensation,Dexterity,Decreased knowledge of use of DME,FMC,UE functional use,ROM,Flexibility Cognitive Skills: Attention,Sequencing,Problem Solve     Visit Diagnosis: Hemiplegia and hemiparesis following cerebral infarction affecting left non-dominant side (HCC)  Muscle weakness (generalized)  Stiffness of left shoulder, not elsewhere classified  Other lack of coordination  Unsteadiness on feet  Other abnormalities of gait and mobility    Problem List Patient Active Problem List   Diagnosis Date Noted  . Spasticity 11/30/2020  . Wheelchair dependence 10/07/2020  . Left knee pain 07/26/2020  . Cerebrovascular accident (CVA) of right basal ganglia (HCC) 07/26/2020  . Left hemiparesis (HCC) 07/25/2020  . Hypokalemia 07/20/2020  . Right basal ganglia embolic stroke (HCC) 07/20/2020  . Essential hypertension 07/20/2020  . Hypothyroidism 07/20/2020    Junious Dresser MOT, OTR/L  02/13/2021, 1:54 PM  [facility] [address] [place], [place],  [address] Phone: [phone]   Fax:  [phone]  Name: Nicole [name] MRN: [id number] Date of Birth: 10/08/1930

## 2021-02-14 ENCOUNTER — Telehealth: Payer: Self-pay

## 2021-02-14 NOTE — Telephone Encounter (Signed)
Per Nicole [name] (Daughte) Nicole [name] Occupational and Physical  therapy will end on 02/15/2021. She would like to have it continued. She has been advised to communicate with the therapist. However she would like your influence on the matter. Can you have it extended? Please advise.

## 2021-02-15 ENCOUNTER — Encounter: Payer: Self-pay | Admitting: Occupational Therapy

## 2021-02-15 ENCOUNTER — Encounter: Payer: Self-pay | Admitting: Physical Therapy

## 2021-02-15 ENCOUNTER — Other Ambulatory Visit: Payer: Self-pay

## 2021-02-15 ENCOUNTER — Ambulatory Visit: Payer: Medicare Other | Admitting: Physical Therapy

## 2021-02-15 ENCOUNTER — Ambulatory Visit: Payer: Medicare Other | Admitting: Occupational Therapy

## 2021-02-15 DIAGNOSIS — R262 Difficulty in walking, not elsewhere classified: Secondary | ICD-10-CM | POA: Diagnosis not present

## 2021-02-15 DIAGNOSIS — M25612 Stiffness of left shoulder, not elsewhere classified: Secondary | ICD-10-CM | POA: Diagnosis not present

## 2021-02-15 DIAGNOSIS — M6281 Muscle weakness (generalized): Secondary | ICD-10-CM | POA: Diagnosis not present

## 2021-02-15 DIAGNOSIS — M25512 Pain in left shoulder: Secondary | ICD-10-CM

## 2021-02-15 DIAGNOSIS — I69354 Hemiplegia and hemiparesis following cerebral infarction affecting left non-dominant side: Secondary | ICD-10-CM

## 2021-02-15 DIAGNOSIS — R2689 Other abnormalities of gait and mobility: Secondary | ICD-10-CM

## 2021-02-15 DIAGNOSIS — R2681 Unsteadiness on feet: Secondary | ICD-10-CM

## 2021-02-15 DIAGNOSIS — R278 Other lack of coordination: Secondary | ICD-10-CM | POA: Diagnosis not present

## 2021-02-15 NOTE — Therapy (Signed)
[facility] [address] [place], [place], [address] Phone: [phone]   Fax:  [phone]  Physical Therapy Treatment/Discharge Summary  Patient Details  Name: Nicole [name] MRN: [id number] Date of Birth: 1930-05-05 Referring Provider (PT): Courtney Heys, MD   Encounter Date: 02/15/2021   PT End of Session - 02/15/21 1448    Visit Number 33    Number of Visits 33    Date for PT Re-Evaluation 03/21/21   updated POC for 4 weeks, Cert for 60 days   Authorization Type BCBS Medicare    Progress Note Due on Visit 39   (Progress Note completed on 19th visit)   PT Start Time 1400    PT Stop Time 1443    PT Time Calculation (min) 43 min    Equipment Utilized During Treatment Gait belt    Activity Tolerance Patient tolerated treatment well    Behavior During Therapy WFL for tasks assessed/performed           Past Medical History:  Diagnosis Date  . Hypertension   . Stroke [facility])    September 2021  . Thyroid disease     History reviewed. No pertinent surgical history.  There were no vitals filed for this visit.   Subjective Assessment - 02/15/21 1423    Subjective No changes since she was last here. Daughter present during session.    Patient is accompained by: Family member   pt's daughter   Pertinent History R Basal Ganglia CVA, Hyperlipidemia, Hypothyroidism, Asthma, Glaucoma, HTN    Limitations Standing;Walking    Patient Stated Goals Be able to walk;    Currently in Pain? No/denies                             [facility] Adult PT Treatment/Exercise - 02/15/21 1601      Ambulation/Gait   Ambulation/Gait Yes    Ambulation/Gait Assistance 5: Supervision    Ambulation/Gait Assistance Details small distances in session with use of RW    Assistive device Rolling walker;Other (Comment)   L AFO   Gait Pattern Step-to pattern;Step-through pattern;Decreased step length - right;Decreased step length -  left;Decreased stance time - left;Decreased hip/knee flexion - left;Decreased stride length;Decreased weight shift to left;Left flexed knee in stance;Antalgic;Trunk flexed;Narrow base of support    Ambulation Surface Level;Indoor      Therapeutic Activites    Therapeutic Activities Other Therapeutic Activities    Other Therapeutic Activities had discussion with pt's daughter about discharge at this time due to pt having a plateau in progress at this time and would need to show improvements in order to continue (daughter wanting pt to continue at this time), after discussion pt's daughter with understanding of D/C at this time. Explained that if pt has a decline in the future after working on exercises/gait at home, then that would be a reason to get a new referral to return to PT. [name] also asking about a seated stepper machine for home for pt to use, trialed 2 in session, however it was too heavy for pt to use with LLE.      Knee/Hip Exercises: Aerobic   Other Aerobic Scifit level 2.5 for 4 minutes with BLEs only for strengthening and activity tolerance. And to show pt's daughter a potential machine that pt can use at the [facility] (pt's daughter asking) - would need assistance getting on and for making sure LLE stays in proper position  Access Code: [email or web address] URL: [email or web address] Date: 02/15/2021 Prepared by: Janann August  Reviewed and finalized pt's HEP with pt's daughter present today for instruction. Also discussed having pt perform with caregiver at home and to keep walking at home with family/caregiver. See MedBridge for more details.   Exercises Seated Hip Abduction with Resistance - 1 x daily - 5 x weekly - 2 sets - 10 reps Seated March with Resistance - 1 x daily - 5 x weekly - 1 sets - 10 reps Sit to Stand with Armchair - 1 x daily - 5 x weekly - 2 sets - 5 reps Standing March with Counter Support - 1 x daily - 5 x weekly - 1 sets - 10  reps Standing Hip Abduction with Counter Support - 1 x daily - 5 x weekly - 1 sets - 3-5 reps Mini Squat with Counter Support - 1 x daily - 5 x weekly - 2 sets - 5 reps         PT Education - 02/15/21 1448    Education Details reviewed HEP, discussed discharge plan with daughter; see TA    Person(s) Educated Patient   pt's daughter   Methods Explanation;Demonstration;Handout    Comprehension Verbalized understanding;Returned demonstration            PT Short Term Goals - 01/20/21 1423      PT SHORT TERM GOAL #1   Title = LTGs             PT Long Term Goals - 02/15/21 1600      PT LONG TERM GOAL #1   Title Patient will be independent with progressive final HEP with caregiver assistance, and report compliance with daily walking within the home with assistance (ALL LTGs Due: 02/17/21)    Baseline reviewed HEP with pt's daughter today, pt not consistently performing at home.    Time 4    Period Weeks    Status Partially Met      PT LONG TERM GOAL #2   Title Patient will be able to ambulate >/= 200 ft on indoor level surfaces with supervision and LRAD to demonstrate improved household mobility    Baseline 200' with RW and supervision on 02/13/21    Time 4    Period Weeks    Status Achieved      PT LONG TERM GOAL #3   Title Patient will completed 5x sit <> stand in </= 15 seconds to demonstrate reduced fall risk and improved balance    Baseline 01/18/21: 22.04 sec's with UE support; on 02/13/21; 21.1 seconds with UE support from mat at standard chair height    Time 4    Period Weeks    Status Not Met      PT LONG TERM GOAL #4   Title Patient will improve TUG to </= 30 seconds to demonstrate reduced fall risk and improved balance    Baseline 01/18/21: 44.68 sec's wth RW/brace on left LE - did not have time to assess on 02/15/21    Time 4    Period Weeks    Status Revised      PT LONG TERM GOAL #5   Title Patient will improve gait speed to >/= 0.80 ft/sec to demonstrate  improved tolerance for household mobility    Baseline 49.13 seconds = .66 ft/sec with RW (previously .72 ft/sec)    Time 4    Status Not Met      PT LONG TERM GOAL #6   Title  Patient will be able to ascend/descend x 12 stairs with B rails and CGA to demonstrate improved safety with household mobiltity    Baseline CGA to Min A with stair negotiation on 02/13/21, needing rest break after performing 8 stairs    Time 4    Period Weeks    Status Not Met            PHYSICAL THERAPY DISCHARGE SUMMARY  Visits from Start of Care: 33  Current functional level related to goals / functional outcomes: See LTGS.   Remaining deficits: decr strength, gait abnormalities, impaired balance, postural abnormalities.   Education / Equipment: HEP    *pt with a plateau in progress. Plan: Patient agrees to discharge.  Patient goals were not met. Patient is being discharged due to lack of progress.  ?????           Plan - 02/15/21 1610    Clinical Impression Statement [name] present throughout session today. Discussed D/C plan at  this time due to a plateau in progress with PT, pt's daughter understanding and in agreement after discussion. Reviewed and updated pt's HEP as appropriate to continue to work on at home as well as to continue ambulating with RW. Did not have time to assess last TUG LTG due to time constraints. Will discharge at this time, all questions answered at end of session.    Personal Factors and Comorbidities Comorbidity 3+;Time since onset of injury/illness/exacerbation;Age    Comorbidities R Basal Ganglia CVA, Hyperlipidemia, Hypothyroidism, Asthma, Glaucoma, HTN    Examination-Activity Limitations Bed Mobility;Dressing;Locomotion Level;Reach Overhead;Stairs;Stand;Transfers    Examination-Participation Restrictions Armed forces logistics/support/administrative officer Evolving/Moderate complexity    Rehab Potential Fair    PT Frequency 2x / week    PT  Duration 4 weeks    PT Treatment/Interventions ADLs/Self Care Home Management;Cryotherapy;Electrical Stimulation;Moist Heat;DME Instruction;Gait training;Stair training;Functional mobility training;Therapeutic activities;Therapeutic exercise;Balance training;Neuromuscular re-education;Patient/family education;Orthotic Fit/Training;Manual techniques;Passive range of motion    PT Next Visit Plan D/C    PT Home Exercise Plan Access Code: [email or web address]    Consulted and Agree with Plan of Care Patient           Patient will benefit from skilled therapeutic intervention in order to improve the following deficits and impairments:  Abnormal gait,Decreased balance,Decreased mobility,Decreased endurance,Difficulty walking,Impaired tone,Impaired sensation,Pain,Decreased strength,Decreased safety awareness,Decreased knowledge of use of DME,Decreased coordination,Decreased activity tolerance,Decreased range of motion  Visit Diagnosis: Hemiplegia and hemiparesis following cerebral infarction affecting left non-dominant side (HCC)  Muscle weakness (generalized)  Other lack of coordination  Unsteadiness on feet     Problem List Patient Active Problem List   Diagnosis Date Noted  . Spasticity 11/30/2020  . Wheelchair dependence 10/07/2020  . Left knee pain 07/26/2020  . Cerebrovascular accident (CVA) of right basal ganglia ([place]) 07/26/2020  . Left hemiparesis ([place]) 07/25/2020  . Hypokalemia 07/20/2020  . Right basal ganglia embolic stroke ([place]) 30/03/1101  . Essential hypertension 07/20/2020  . Hypothyroidism 07/20/2020    Arliss Journey, PT, DPT  02/15/2021, 4:12 PM  [facility] [address] [place], [place], [address] Phone: [phone]   Fax:  [phone]  Name: Nicole [name] MRN: [id number] Date of Birth: 07-31-1930

## 2021-02-15 NOTE — Therapy (Signed)
[facility] [address] [place], [place], [address] Phone: [phone]   Fax:  [phone]  Occupational Therapy Treatment & Discharge  Patient Details  Name: Nicole [name] MRN: [id number] Date of Birth: [birth year] Referring Provider (OT): Courtney Heys, MD   Encounter Date: 02/15/2021   OT End of Session - 02/15/21 1319    Visit Number 29    Number of Visits 31   +8 visits at renewal   Date for OT Re-Evaluation 02/24/21    Authorization Type BCBS Medicare    Authorization Time Period $40 Copay VL:MN No Auth required  10th visit PN     Authorization - Visit Number 31    Authorization - Number of Visits 33    Progress Note Due on Visit 5    OT Start Time 1317    OT Stop Time 1400    OT Time Calculation (min) 43 min    Activity Tolerance Patient tolerated treatment well    Behavior During Therapy WFL for tasks assessed/performed           Past Medical History:  Diagnosis Date  . Hypertension   . Stroke [facility])    September 2021  . Thyroid disease     History reviewed. No pertinent surgical history.  There were no vitals filed for this visit.   Subjective Assessment - 02/15/21 1320    Subjective  Pt denies any pain.    Pertinent History CVA R basal ganglia, HTN, HLD, hypothyroidism, asthma, glaucoma    Limitations fall risk. hard of hearing    Currently in Pain? No/denies           OCCUPATIONAL THERAPY DISCHARGE SUMMARY  Visits from Start of Care: 29  Current functional level related to goals / functional outcomes: Pt has met all STGs and LTGs and is ready for discharge. Pt continues to guard LUE and have limited functional use but has made many gains with range of motion, strength and coordination with LUE.    Remaining deficits: Continues to guard and limit use of LUE. Limited carryover at home.   Education / Equipment: HEPs for ROM, coordination  Plan: Patient agrees to discharge.  Patient  goals were met. Patient is being discharged due to meeting the stated rehab goals.  ?????           Grooved Pegs with LUE with strategic moving left to right by rows for encouraging increased rotating of pegs in hand in LUE. Improved coordination and speed from previous session.  Pennies with LUE - picking up and placing in piggy bank, stacking and in hand manipulation in LUE with min difficulty.               OT Education - 02/15/21 1407    Education Details Education on functional tasks to do with LUE at home, education to daughter DP:[id number] plan    Person(s) Educated Patient;Child(ren)    Methods Explanation;Demonstration;Handout;Verbal cues    Comprehension Verbalized understanding;Returned demonstration;Verbal cues required            OT Short Term Goals - 01/27/21 1319      OT SHORT TERM GOAL #1   Title Pt will be independent with HEP (12/07/20)    Time 4    Period Weeks    Status Achieved    Target Date 12/07/20      OT SHORT TERM GOAL #2   Title Pt will improve grip strength in LUE by 3 lbs or greater  for increase in functional use of LUE and increase independence with clothing management.    Baseline RUE 42.1 LUE 26.8    Time 4    Period Weeks    Status Achieved   37.9 LUE     OT SHORT TERM GOAL #3   Title Pt will obtain item from mid level shelf with increase in shoulder flexion to 70 degrees with good positioning and reduction in shoulder hiking and compensatory movements.    Baseline LUE 66 degrees sh flexion    Time 4    Period Weeks    Status Achieved   90* on 12/23/2020     OT SHORT TERM GOAL #4   Title Pt will demonstrate improved box and blocks score to 35 or greater with LUE for increase in functional use of LUE.    Baseline 29 LUE    Time 4    Period Weeks    Status Achieved   01/25/21 35 blocks     OT SHORT TERM GOAL #5   Title Pt will verbalize understanding of sleep positions for LUE to decrease pain.    Baseline 6/10 LUE  shoulder pain at night    Time 4    Period Weeks    Status Achieved      OT SHORT TERM GOAL #6   Title Pt will don bra with mod I    Baseline daughter currently assisting    Time 4    Period Weeks    Status Achieved   12/07/20 - pt reports donning bra all clothing     OT SHORT TERM GOAL #7   Title --    Time --    Period --    Status --             OT Long Term Goals - 02/15/21 1321      OT LONG TERM GOAL #1   Title Pt will be independent with updated HEP 01/04/2021    Time 8    Period Weeks    Status Achieved   pt completing shoulder HEPs     OT LONG TERM GOAL #2   Title Pt will demonstrate increased grip strength in LUE to 33 lbs or greater for improved functional use. UPDATE: 37 lbs or greater    Baseline RUE 42.1 LUE 26.8    Time 8    Period Weeks    Status Achieved   42.9 lbs on 01/25/21     OT LONG TERM GOAL #3   Title Pt will perform UB and LB dressing with mod I.    Time 8    Period Weeks    Status Achieved   pt and caregiver report pt is doing all dressing except shower days     OT LONG TERM GOAL #4   Title Pt will increase fine motor coordination by completing 9 hole peg test in 50 seconds or less with LUE. Upgraded 12/23/2020: 45seconds or less with LUE UPDATE: 38s or less with L elbow on table    Baseline RUE 27.47 LUE 61.87    Time 8    Period Weeks    Status Not Met   02/15/21 41.28s with LUE - met original but did not meet revision/updated goal     OT LONG TERM GOAL #5   Title Pt will improve shoulder flexion to at least 100 degrees in order to obtain object and increase functional reach with minimal shoulder hiking and/or lateral flexion    Baseline  LUE 66 degrees sh flexion    Time 8    Period Weeks    Status Achieved   100* with minimal shoulder hiking and lateral flexing 02/13/21     OT LONG TERM GOAL #6   Title Pt will report decrease in pain in LUE shoulder at night to less than or equal to 4/10 with improved sleep positioning and range of  motion    Time 8    Period Weeks    Status Achieved      OT LONG TERM GOAL #7   Title Pt will report using LUE for functional activities at least 50% of the time for bimanual tasks    Time 4    Period Weeks    Status Achieved                 Plan - 02/15/21 1326    Clinical Impression Statement Pt has met all goals and is ready for discharge. Pt has improved with overall range of motion, strength and coodintaion with LUE and increased functional use.    OT Occupational Profile and History Problem Focused Assessment - Including review of records relating to presenting problem    Occupational performance deficits (Please refer to evaluation for details): ADL's;IADL's    Body Structure / Function / Physical Skills ADL;Balance;Coordination;IADL;Pain;GMC;Hearing;Strength;Tone;Sensation;Dexterity;Decreased knowledge of use of DME;FMC;UE functional use;ROM;Flexibility    Cognitive Skills Attention;Sequencing;Problem Solve    OT Frequency 2x / week    OT Duration 4 weeks   renewal completed for 2x/week for 4 weeks   OT Treatment/Interventions Self-care/ADL training;Cryotherapy;Ultrasound;Moist Heat;Electrical Stimulation;Paraffin;Energy conservation;DME and/or AE instruction;Manual Therapy;Functional Mobility Training;Neuromuscular education;Therapeutic exercise;Visual/perceptual remediation/compensation;Patient/family education;Balance training;Therapeutic activities;Passive range of motion;Cognitive remediation/compensation    Plan OT discharge    Consulted and Agree with Plan of Care Family member/caregiver;Patient    Family Member Consulted caregiever Pam           Patient will benefit from skilled therapeutic intervention in order to improve the following deficits and impairments:   Body Structure / Function / Physical Skills: ADL,Balance,Coordination,IADL,Pain,GMC,Hearing,Strength,Tone,Sensation,Dexterity,Decreased knowledge of use of DME,FMC,UE functional  use,ROM,Flexibility Cognitive Skills: Attention,Sequencing,Problem Solve     Visit Diagnosis: Hemiplegia and hemiparesis following cerebral infarction affecting left non-dominant side (HCC)  Muscle weakness (generalized)  Stiffness of left shoulder, not elsewhere classified  Other lack of coordination  Unsteadiness on feet  Other abnormalities of gait and mobility  Acute pain of left shoulder    Problem List Patient Active Problem List   Diagnosis Date Noted  . Spasticity 11/30/2020  . Wheelchair dependence 10/07/2020  . Left knee pain 07/26/2020  . Cerebrovascular accident (CVA) of right basal ganglia ([place]) 07/26/2020  . Left hemiparesis ([place]) 07/25/2020  . Hypokalemia 07/20/2020  . Right basal ganglia embolic stroke ([place]) 34/35/6861  . Essential hypertension 07/20/2020  . Hypothyroidism 07/20/2020    Zachery Conch MOT, OTR/L  02/15/2021, 2:08 PM  [facility] [address] [place], [place], [address] Phone: [phone]   Fax:  [phone]  Name: PALLAS WAHLERT MRN: [id number] Date of Birth: 1930-05-31

## 2021-02-15 NOTE — Patient Instructions (Signed)
[place]  Picking up bottle caps  Picking up pennies, beans, marbles, etc and placing into container (tupperware container with slit cut in it or piggy bank)  Stacking pennies and picking up stack of 5 and moving 1 at a time to fingertips to place into container  Stacking plastic cups  Force use of L hand with anything that is not breakable (glass), hot or liquid. Try feeding self finger foods, drinking water from left hand, brushing teeth, etc.   Reaching to top of head to assist with hair  Washing left side of body with Left hand

## 2021-02-15 NOTE — Patient Instructions (Signed)
Access Code: [email or web address] URL: [email or web address] Date: 02/15/2021 Prepared by: Sherlie Ban  Exercises Seated Hip Abduction with Resistance - 1 x daily - 5 x weekly - 2 sets - 10 reps Seated March with Resistance - 1 x daily - 5 x weekly - 1 sets - 10 reps Sit to Stand with Armchair - 1 x daily - 5 x weekly - 2 sets - 5 reps Standing March with Counter Support - 1 x daily - 5 x weekly - 1 sets - 10 reps Standing Hip Abduction with Counter Support - 1 x daily - 5 x weekly - 1 sets - 3-5 reps Mini Squat with Counter Support - 1 x daily - 5 x weekly - 2 sets - 5 reps

## 2021-02-15 NOTE — Telephone Encounter (Signed)
I don't know why they are stopping it- if therapy feels she has plateau'd then I have no say in this, if insurance has refused ot pay any longer, I cannot appeal this, unfortunately., but if my prescription has run out, then I'm happy to renew the Rx for PT and OT- pt's daughter will need to let us know, so I can do so.   Thank you, ML

## 2021-02-16 ENCOUNTER — Other Ambulatory Visit (HOSPITAL_BASED_OUTPATIENT_CLINIC_OR_DEPARTMENT_OTHER): Payer: Self-pay

## 2021-02-16 ENCOUNTER — Telehealth: Payer: Self-pay

## 2021-02-16 NOTE — Telephone Encounter (Incomplete Revision)
Message left for call.   I finally spoke with her Daughter. She understood Dr. Lovorn's reply.   

## 2021-02-16 NOTE — Telephone Encounter (Addendum)
Message left for call.   I finally spoke with her Daughter. She understood Dr. Dahlia Client reply.

## 2021-02-17 ENCOUNTER — Other Ambulatory Visit (HOSPITAL_BASED_OUTPATIENT_CLINIC_OR_DEPARTMENT_OTHER): Payer: Self-pay

## 2021-02-17 DIAGNOSIS — I6381 Other cerebral infarction due to occlusion or stenosis of small artery: Secondary | ICD-10-CM | POA: Diagnosis not present

## 2021-02-17 DIAGNOSIS — M25562 Pain in left knee: Secondary | ICD-10-CM | POA: Diagnosis not present

## 2021-02-17 DIAGNOSIS — G8194 Hemiplegia, unspecified affecting left nondominant side: Secondary | ICD-10-CM | POA: Diagnosis not present

## 2021-02-17 DIAGNOSIS — I6350 Cerebral infarction due to unspecified occlusion or stenosis of unspecified cerebral artery: Secondary | ICD-10-CM | POA: Diagnosis not present

## 2021-02-17 MED ORDER — COVID-19 MRNA VACCINE (PFIZER) 30 MCG/0.3ML IM SUSP
INTRAMUSCULAR | 0 refills | Status: DC
  Filled 2021-02-17: qty 0.3, 1d supply, fill #0

## 2021-02-21 NOTE — Telephone Encounter (Signed)
Task completed

## 2021-03-10 ENCOUNTER — Encounter: Payer: Medicare Other | Attending: Registered Nurse | Admitting: Physical Medicine and Rehabilitation

## 2021-03-10 DIAGNOSIS — R252 Cramp and spasm: Secondary | ICD-10-CM | POA: Insufficient documentation

## 2021-03-10 DIAGNOSIS — I639 Cerebral infarction, unspecified: Secondary | ICD-10-CM | POA: Insufficient documentation

## 2021-03-10 DIAGNOSIS — G8194 Hemiplegia, unspecified affecting left nondominant side: Secondary | ICD-10-CM | POA: Insufficient documentation

## 2021-03-10 NOTE — Progress Notes (Deleted)
   Subjective:    Patient ID: Nicole [name], female    DOB: January 13, 1930, 87 y.o.   MRN: [id number]  HPI  Pain Inventory Average Pain 6 Pain Right Now 0 My pain is dull  LOCATION OF PAIN  hip  BOWEL Number of stools per week: 7 Oral laxative use No  Type of laxative na Enema or suppository use No  History of colostomy No  Incontinent No   BLADDER Normal In and out cath, frequency na Able to self cath na Bladder incontinence No  Frequent urination No  Leakage with coughing No  Difficulty starting stream No  Incomplete bladder emptying No    Mobility walk with assistance use a walker how many minutes can you walk? 20 ability to climb steps?  yes do you drive?  yes use a wheelchair transfers alone  Function retired I need assistance with the following:  meal prep and shopping  Neuro/Psych No problems in this area  Prior Studies Any changes since last visit?  no  Physicians involved in your care Any changes since last visit?  no   Family History  Problem Relation Age of Onset  . Diabetes Mother   . Stroke Mother   . Diabetes Other   . Hypertension Other    Social History   Socioeconomic History  . Marital status: Single    Spouse name: Not on file  . Number of children: Not on file  . Years of education: Not on file  . Highest education level: Not on file  Occupational History  . Not on file  Tobacco Use  . Smoking status: Never Smoker  . Smokeless tobacco: Never Used  Vaping Use  . Vaping Use: Never used  Substance and Sexual Activity  . Alcohol use: No    Alcohol/week: 0.0 standard drinks  . Drug use: No  . Sexual activity: Not on file  Other Topics Concern  . Not on file  Social History Narrative   Lives with daughter   Right handed   Drinks 1-2 cups caffeine daily   Social Determinants of Health   Financial Resource Strain: Not on file  Food Insecurity: Not on file  Transportation Needs: Not on file  Physical Activity: Not on  file  Stress: Not on file  Social Connections: Not on file   No past surgical history on file. Past Medical History:  Diagnosis Date  . Hypertension   . Stroke [facility])    September 2021  . Thyroid disease    There were no vitals taken for this visit.  Opioid Risk Score:   Fall Risk Score:  `1  Depression screen PHQ 2/9  Depression screen PHQ 2/9 08/26/2020  Decreased Interest 1  Down, Depressed, Hopeless 1  PHQ - 2 Score 2  Altered sleeping 1  Tired, decreased energy 1  Change in appetite 3  Feeling bad or failure about yourself  3  Trouble concentrating 0  Moving slowly or fidgety/restless 0  Suicidal thoughts 1  PHQ-9 Score 11    Review of Systems  Musculoskeletal:       Hip pain  All other systems reviewed and are negative.      Objective:   Physical Exam        Assessment & Plan:

## 2021-03-19 DIAGNOSIS — I6381 Other cerebral infarction due to occlusion or stenosis of small artery: Secondary | ICD-10-CM | POA: Diagnosis not present

## 2021-03-19 DIAGNOSIS — G8194 Hemiplegia, unspecified affecting left nondominant side: Secondary | ICD-10-CM | POA: Diagnosis not present

## 2021-03-19 DIAGNOSIS — I6350 Cerebral infarction due to unspecified occlusion or stenosis of unspecified cerebral artery: Secondary | ICD-10-CM | POA: Diagnosis not present

## 2021-03-19 DIAGNOSIS — M25562 Pain in left knee: Secondary | ICD-10-CM | POA: Diagnosis not present

## 2021-03-20 ENCOUNTER — Emergency Department (HOSPITAL_COMMUNITY): Payer: Medicare Other

## 2021-03-20 ENCOUNTER — Inpatient Hospital Stay (HOSPITAL_COMMUNITY)
Admission: EM | Admit: 2021-03-20 | Discharge: 2021-03-29 | DRG: 558 | Disposition: A | Discharge status: Skilled Nursing Facility | Payer: Medicare Other | Attending: Family Medicine | Admitting: Family Medicine

## 2021-03-20 ENCOUNTER — Other Ambulatory Visit: Payer: Self-pay

## 2021-03-20 ENCOUNTER — Encounter (HOSPITAL_COMMUNITY): Payer: Self-pay

## 2021-03-20 ENCOUNTER — Telehealth: Payer: Self-pay

## 2021-03-20 DIAGNOSIS — Z7902 Long term (current) use of antithrombotics/antiplatelets: Secondary | ICD-10-CM

## 2021-03-20 DIAGNOSIS — M170 Bilateral primary osteoarthritis of knee: Secondary | ICD-10-CM | POA: Diagnosis not present

## 2021-03-20 DIAGNOSIS — R269 Unspecified abnormalities of gait and mobility: Secondary | ICD-10-CM

## 2021-03-20 DIAGNOSIS — S73192A Other sprain of left hip, initial encounter: Secondary | ICD-10-CM | POA: Diagnosis not present

## 2021-03-20 DIAGNOSIS — I10 Essential (primary) hypertension: Secondary | ICD-10-CM | POA: Diagnosis present

## 2021-03-20 DIAGNOSIS — R609 Edema, unspecified: Secondary | ICD-10-CM | POA: Diagnosis not present

## 2021-03-20 DIAGNOSIS — I745 Embolism and thrombosis of iliac artery: Secondary | ICD-10-CM | POA: Diagnosis not present

## 2021-03-20 DIAGNOSIS — M160 Bilateral primary osteoarthritis of hip: Secondary | ICD-10-CM | POA: Diagnosis present

## 2021-03-20 DIAGNOSIS — R1032 Left lower quadrant pain: Secondary | ICD-10-CM

## 2021-03-20 DIAGNOSIS — R2689 Other abnormalities of gait and mobility: Secondary | ICD-10-CM | POA: Diagnosis not present

## 2021-03-20 DIAGNOSIS — Z8673 Personal history of transient ischemic attack (TIA), and cerebral infarction without residual deficits: Secondary | ICD-10-CM

## 2021-03-20 DIAGNOSIS — M79605 Pain in left leg: Secondary | ICD-10-CM | POA: Diagnosis not present

## 2021-03-20 DIAGNOSIS — Z8249 Family history of ischemic heart disease and other diseases of the circulatory system: Secondary | ICD-10-CM

## 2021-03-20 DIAGNOSIS — J45909 Unspecified asthma, uncomplicated: Secondary | ICD-10-CM | POA: Diagnosis present

## 2021-03-20 DIAGNOSIS — S76012A Strain of muscle, fascia and tendon of left hip, initial encounter: Secondary | ICD-10-CM | POA: Diagnosis not present

## 2021-03-20 DIAGNOSIS — M6638 Spontaneous rupture of flexor tendons, other site: Principal | ICD-10-CM | POA: Diagnosis present

## 2021-03-20 DIAGNOSIS — Z79890 Hormone replacement therapy (postmenopausal): Secondary | ICD-10-CM

## 2021-03-20 DIAGNOSIS — Z79899 Other long term (current) drug therapy: Secondary | ICD-10-CM

## 2021-03-20 DIAGNOSIS — M25562 Pain in left knee: Secondary | ICD-10-CM | POA: Diagnosis not present

## 2021-03-20 DIAGNOSIS — H9190 Unspecified hearing loss, unspecified ear: Secondary | ICD-10-CM | POA: Diagnosis present

## 2021-03-20 DIAGNOSIS — M1612 Unilateral primary osteoarthritis, left hip: Secondary | ICD-10-CM | POA: Diagnosis not present

## 2021-03-20 DIAGNOSIS — K5730 Diverticulosis of large intestine without perforation or abscess without bleeding: Secondary | ICD-10-CM | POA: Diagnosis not present

## 2021-03-20 DIAGNOSIS — E039 Hypothyroidism, unspecified: Secondary | ICD-10-CM | POA: Diagnosis present

## 2021-03-20 DIAGNOSIS — K3189 Other diseases of stomach and duodenum: Secondary | ICD-10-CM | POA: Diagnosis not present

## 2021-03-20 DIAGNOSIS — Z823 Family history of stroke: Secondary | ICD-10-CM

## 2021-03-20 DIAGNOSIS — Z20822 Contact with and (suspected) exposure to covid-19: Secondary | ICD-10-CM | POA: Diagnosis present

## 2021-03-20 DIAGNOSIS — R29810 Facial weakness: Secondary | ICD-10-CM | POA: Diagnosis not present

## 2021-03-20 DIAGNOSIS — M25559 Pain in unspecified hip: Secondary | ICD-10-CM | POA: Diagnosis present

## 2021-03-20 DIAGNOSIS — M533 Sacrococcygeal disorders, not elsewhere classified: Secondary | ICD-10-CM | POA: Diagnosis not present

## 2021-03-20 DIAGNOSIS — I69354 Hemiplegia and hemiparesis following cerebral infarction affecting left non-dominant side: Secondary | ICD-10-CM

## 2021-03-20 DIAGNOSIS — R531 Weakness: Secondary | ICD-10-CM | POA: Diagnosis not present

## 2021-03-20 LAB — BASIC METABOLIC PANEL
Anion gap: 7 (ref 5–15)
BUN: 14 mg/dL (ref 8–23)
CO2: 27 mmol/L (ref 22–32)
Calcium: 9.3 mg/dL (ref 8.9–10.3)
Chloride: 105 mmol/L (ref 98–111)
Creatinine, Ser: 0.79 mg/dL (ref 0.44–1.00)
GFR, Estimated: >60 mL/min (ref >60)
Glucose, Bld: 97 mg/dL (ref 70–99)
Potassium: 4.2 mmol/L (ref 3.5–5.1)
Sodium: 139 mmol/L (ref 135–145)

## 2021-03-20 LAB — CBC WITH DIFFERENTIAL/PLATELET
Abs Immature Granulocytes: 0.02 10*3/uL (ref 0.00–0.07)
Basophils Absolute: 0.1 10*3/uL (ref 0.0–0.1)
Basophils Relative: 1 %
Eosinophils Absolute: 0.1 10*3/uL (ref 0.0–0.5)
Eosinophils Relative: 1 %
HCT: 40.8 % (ref 36.0–46.0)
Hemoglobin: 12.9 g/dL (ref 12.0–15.0)
Immature Granulocytes: 0 %
Lymphocytes Relative: 30 %
Lymphs Abs: 2.2 10*3/uL (ref 0.7–4.0)
MCH: 31 pg (ref 26.0–34.0)
MCHC: 31.6 g/dL (ref 30.0–36.0)
MCV: 98.1 fL (ref 80.0–100.0)
Monocytes Absolute: 0.5 10*3/uL (ref 0.1–1.0)
Monocytes Relative: 6 %
Neutro Abs: 4.5 10*3/uL (ref 1.7–7.7)
Neutrophils Relative %: 62 %
Platelets: 220 10*3/uL (ref 150–400)
RBC: 4.16 MIL/uL (ref 3.87–5.11)
RDW: 12.6 % (ref 11.5–15.5)
WBC: 7.3 10*3/uL (ref 4.0–10.5)
nRBC: 0 % (ref 0.0–0.2)

## 2021-03-20 IMAGING — MR MR HIP*L* W/O CM
4 of 6 series · 18 of 40 positions shown · non-contrast
Comparison: Radiograph [DATE]

CLINICAL DATA: Hip pain, stress fracture suspected, neg xray

EXAM:
MR OF THE LEFT HIP WITHOUT CONTRAST
TECHNIQUE: Multiplanar, multisequence MR imaging was performed. No intravenous
contrast was administered.

[Series 3: T2 fat-sat · coronal · 4.0mm · 0.70mm/px · 3 of 30 slices shown]
[im 5/30]
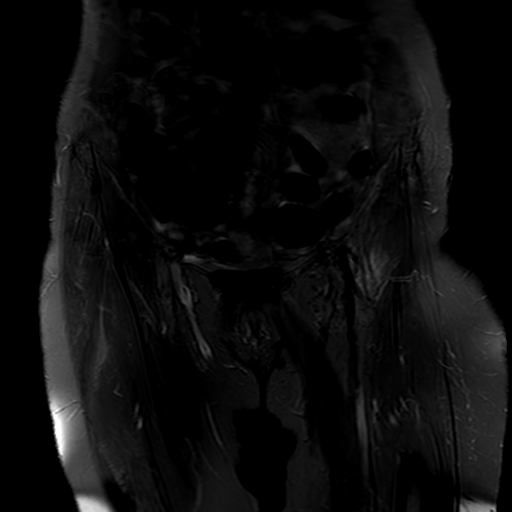
[im 15/30]
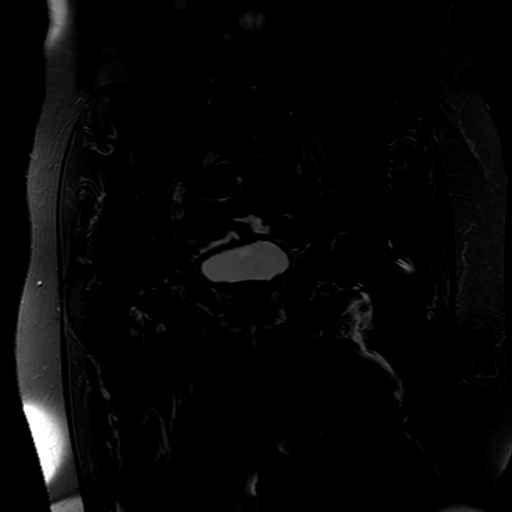
[im 25/30]
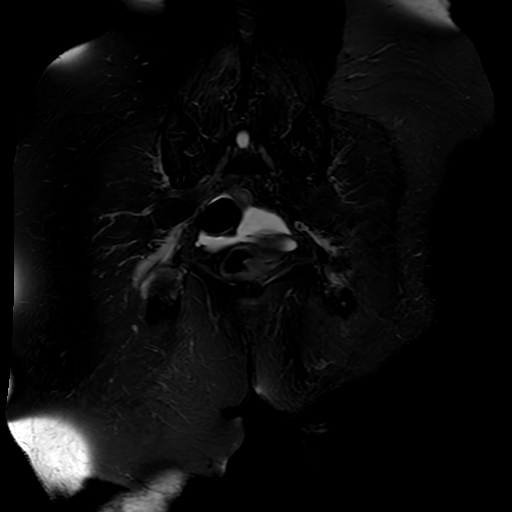

[Series 6: PD fat-sat · sagittal · 4.0mm · 0.35mm/px · 8 of 33 slices shown (1 of 3)]
[im 1/33]
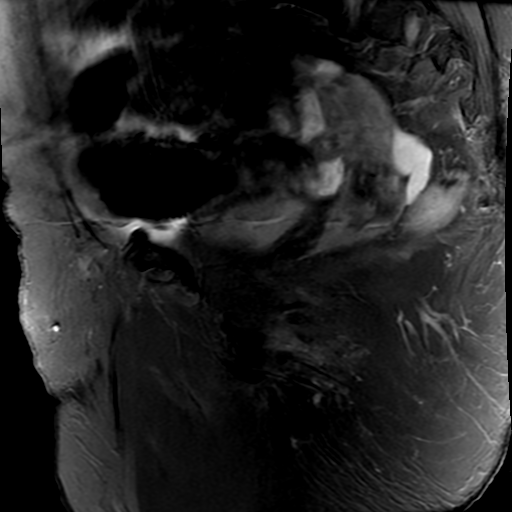
[im 5/33]
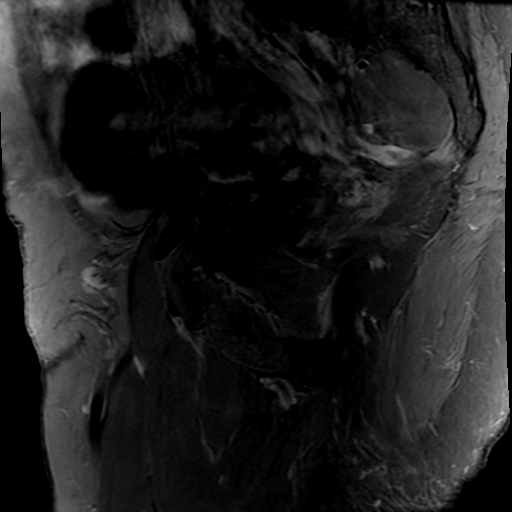
[im 10/33]
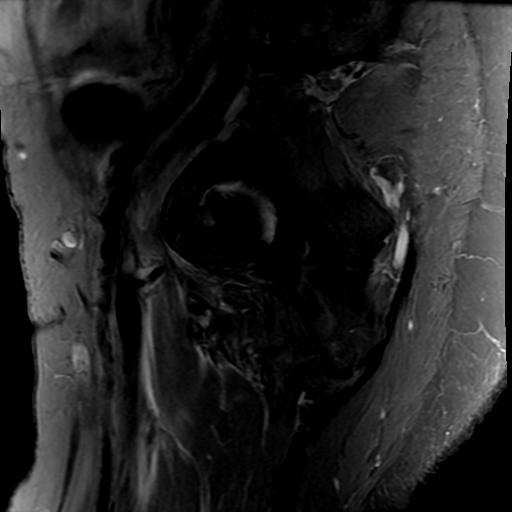
[im 14/33]
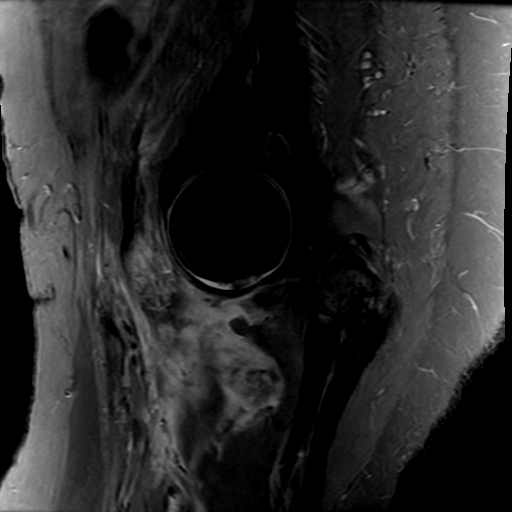
[im 19/33]
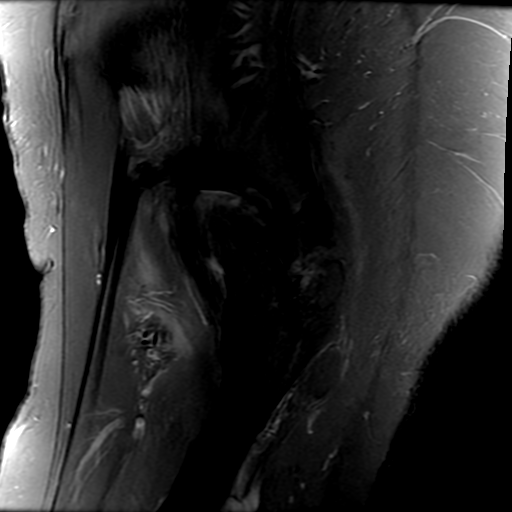
[im 23/33]
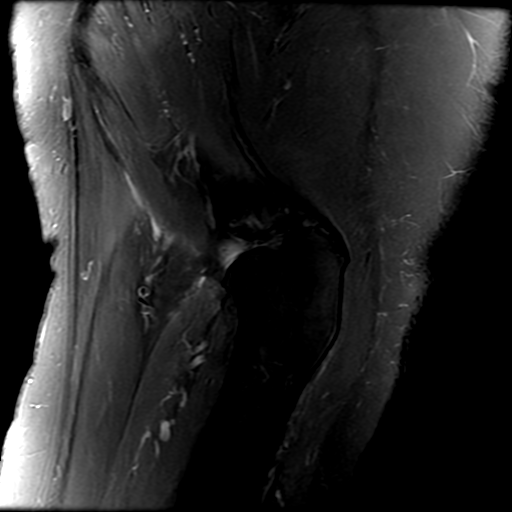
[im 28/33]
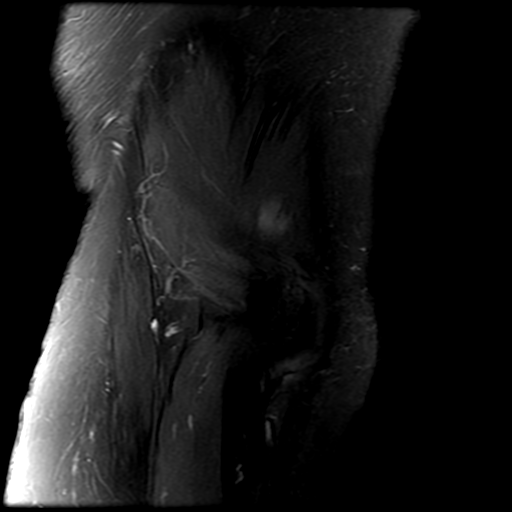
[im 33/33]
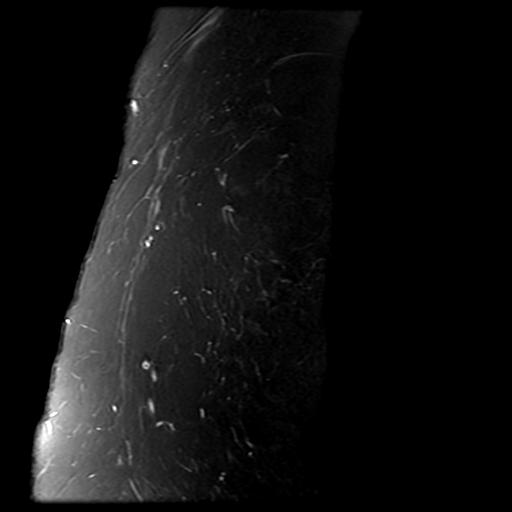

[Series 7: PD fat-sat · coronal · 4.0mm · 0.35mm/px · 4 of 33 slices shown (2 of 3)]
[im 1/33]
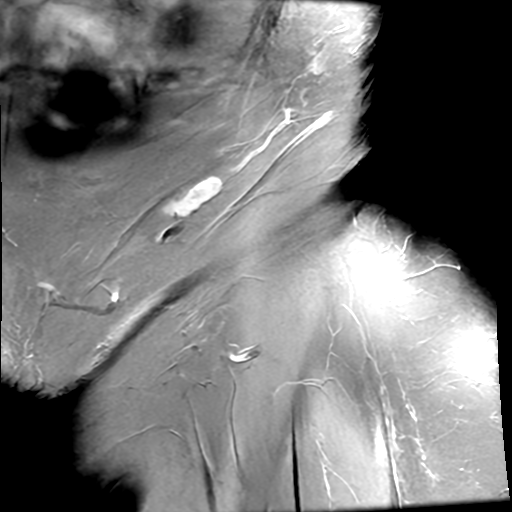
[im 5/33]
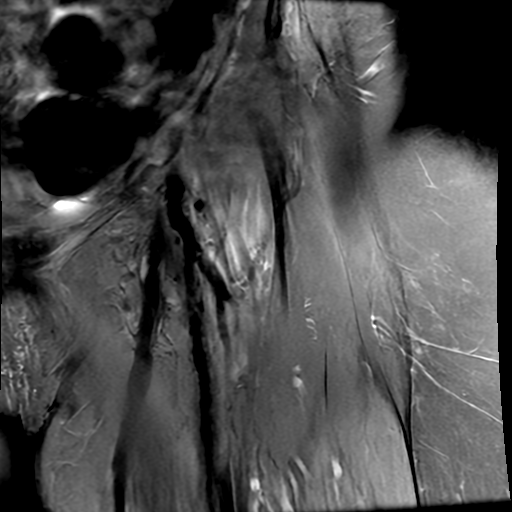
[im 19/33]
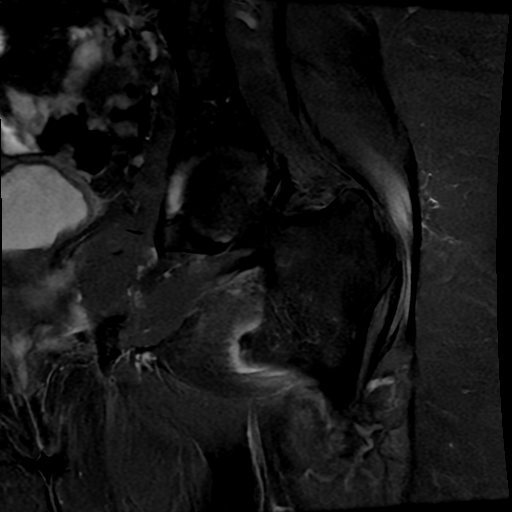
[im 28/33]
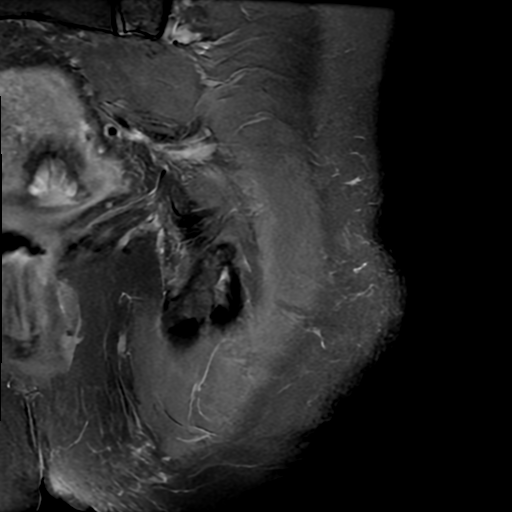

[Series 8: PD fat-sat · axial · 4.0mm · 0.35mm/px · z∈[+34,+88]mm · 3 of 14 slices shown (3 of 3)]
[im 1/14]
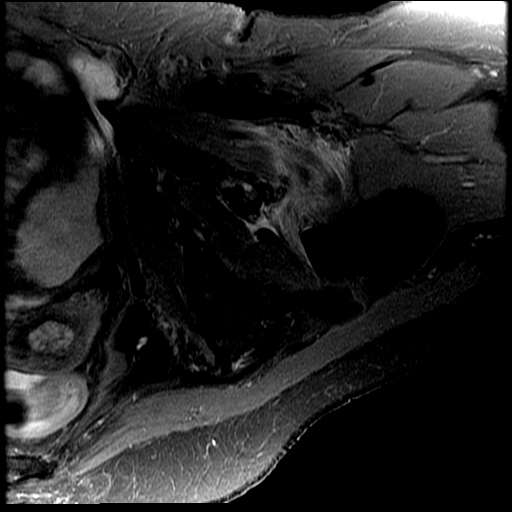
[im 7/14]
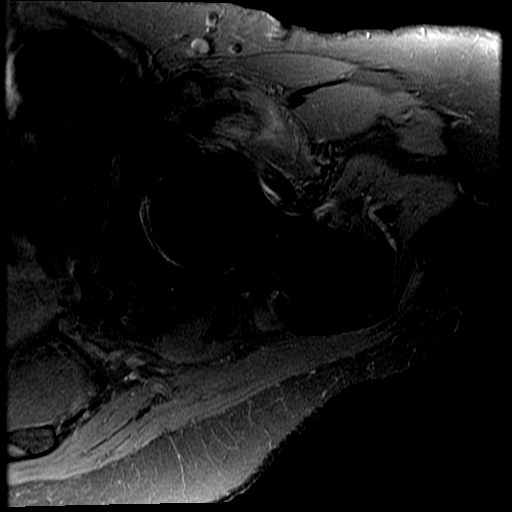
[im 14/14]
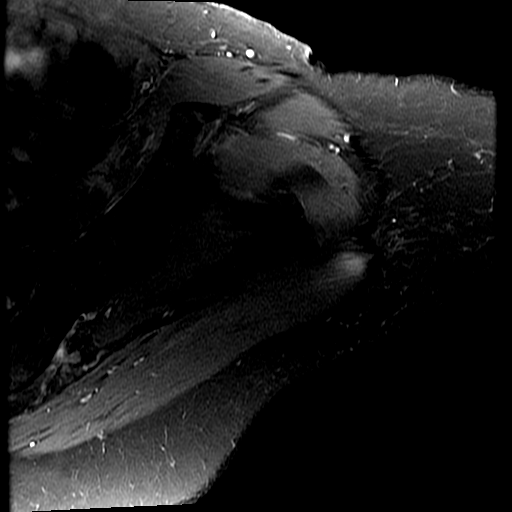

[18 of 40 positions shown; findings below may reference images not displayed]

FINDINGS: Bones: There is mild disruption of the lesser trochanter cortex from
tendon avulsion. There is no displaced fracture. There is no focal
bone lesion. There is subchondral bony edema in the left inferior SI
joint, this is on both sides of the joint in the sacrum and iliac
bone and likely degenerative. Lower lumbar spine degenerative
change.

Articular cartilage and labrum

Articular cartilage:  Mild to moderate chondrosis.

Labrum: Degenerative superior labral tearing.

Joint or bursal effusion

Joint effusion: Small hip effusion.

Bursae: There is fluid within the iliopsoas bursa, reactive from
tendon tear. No evidence of trochanteric bursitis.

Muscles and tendons

Muscles and tendons: There is avulsion of the iliopsoas tendon from
its insertion on the lesser trochanter, with differentially
retracted fibers, overall retraction measuring approximately 5.5 cm.
There is intramuscular edema in the left gluteal muscles, likely
reactive. The distal gluteal tendons appear intact. Degenerative
signal at the proximal hamstrings tendons likely normal for age.
Reactive muscle edema in the adductors of the left hip.

Other findings

Miscellaneous: Trace free fluid in the pelvis. Otherwise, the
visualized structures are unremarkable.
IMPRESSION: Acute full-thickness tear/avulsion of the distal iliopsoas tendon
from its insertion on the lesser trochanter of the left femur. Mild
disruption of the lesser trochanter bony cortex, with preservation
of the majority of bony architecture. No evidence of underlying bony
lesion in the left femur. Reactive joint effusion, iliopsoas
bursitis, edema in the adductors, and edema in the gluteal
musculature.

Mild to moderate left hip osteoarthritis with degenerative superior
labral tearing.

Lower lumbar spine and left sacroiliac joint degenerative change.

## 2021-03-20 IMAGING — DX DG HIP (WITH OR WITHOUT PELVIS) 2-3V*L*
3 series · 3 of 3 positions shown · non-contrast
Comparison: None.

CLINICAL DATA: Acute left hip and leg pain, no reported injury.

EXAM:
DG HIP (WITH OR WITHOUT PELVIS) 2-3V LEFT

[pelvis ap]
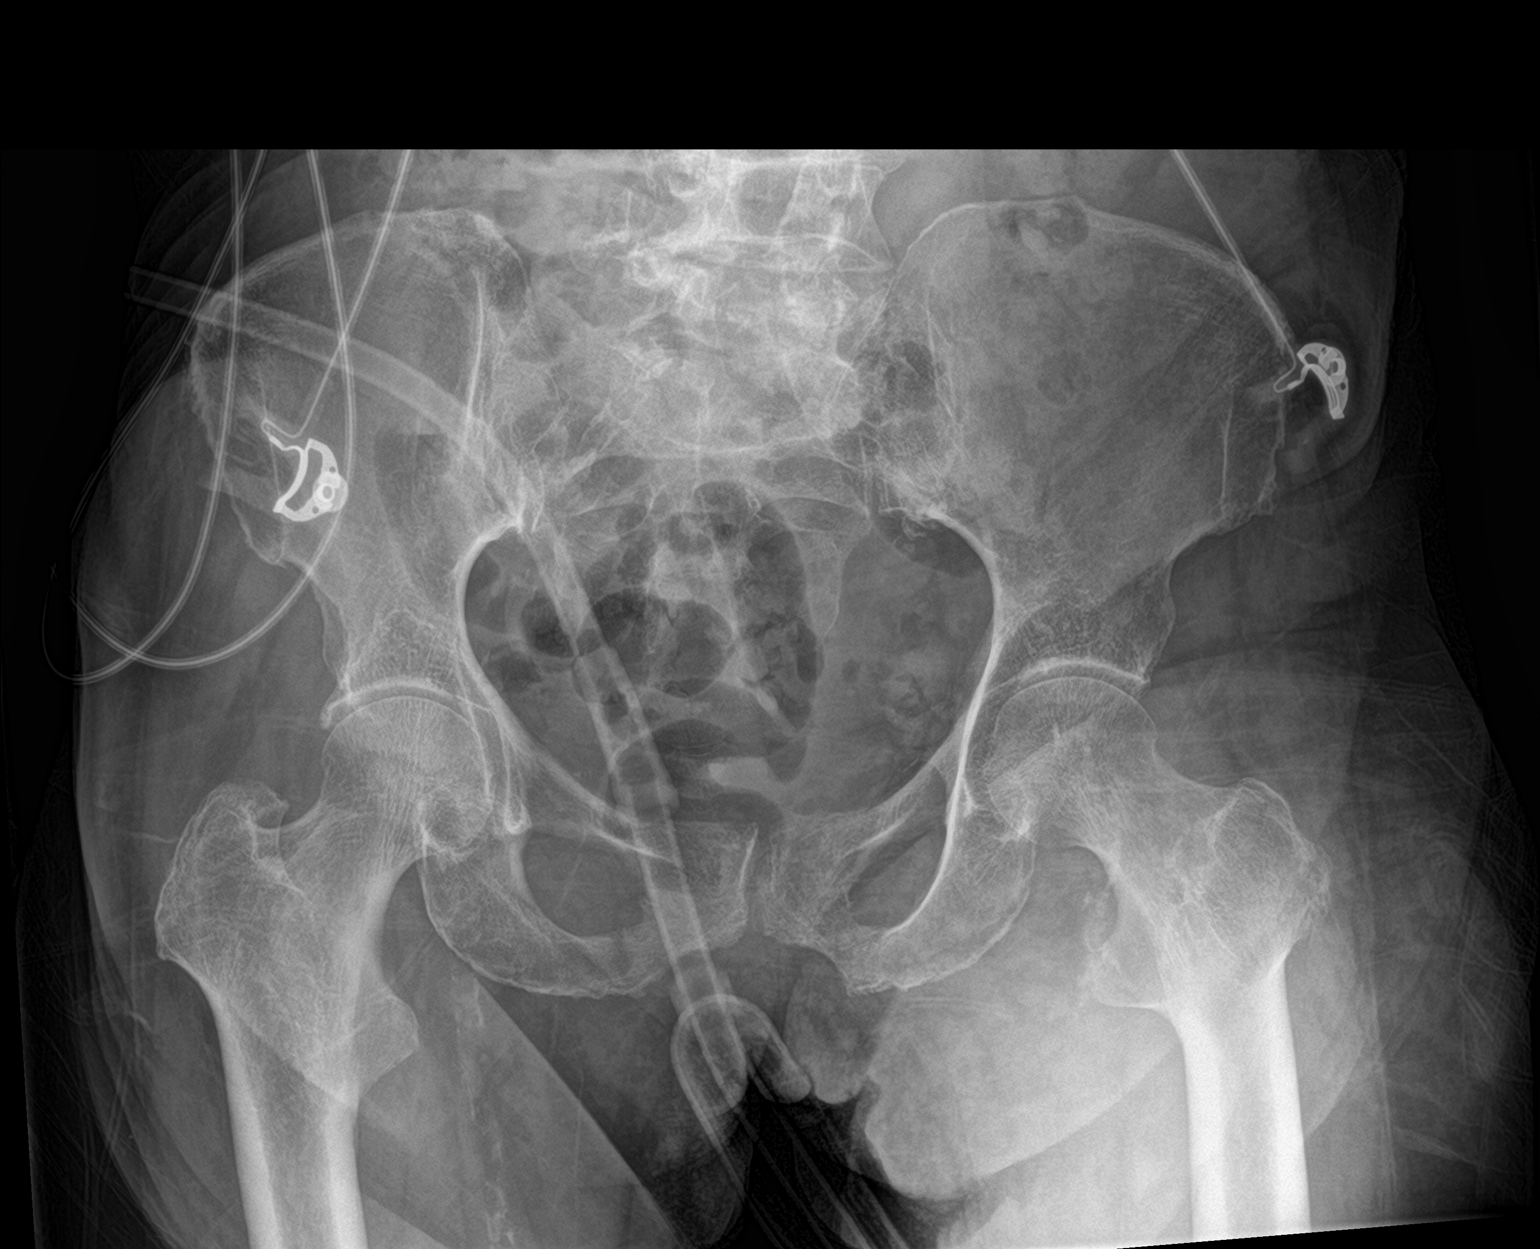

[hip ap]
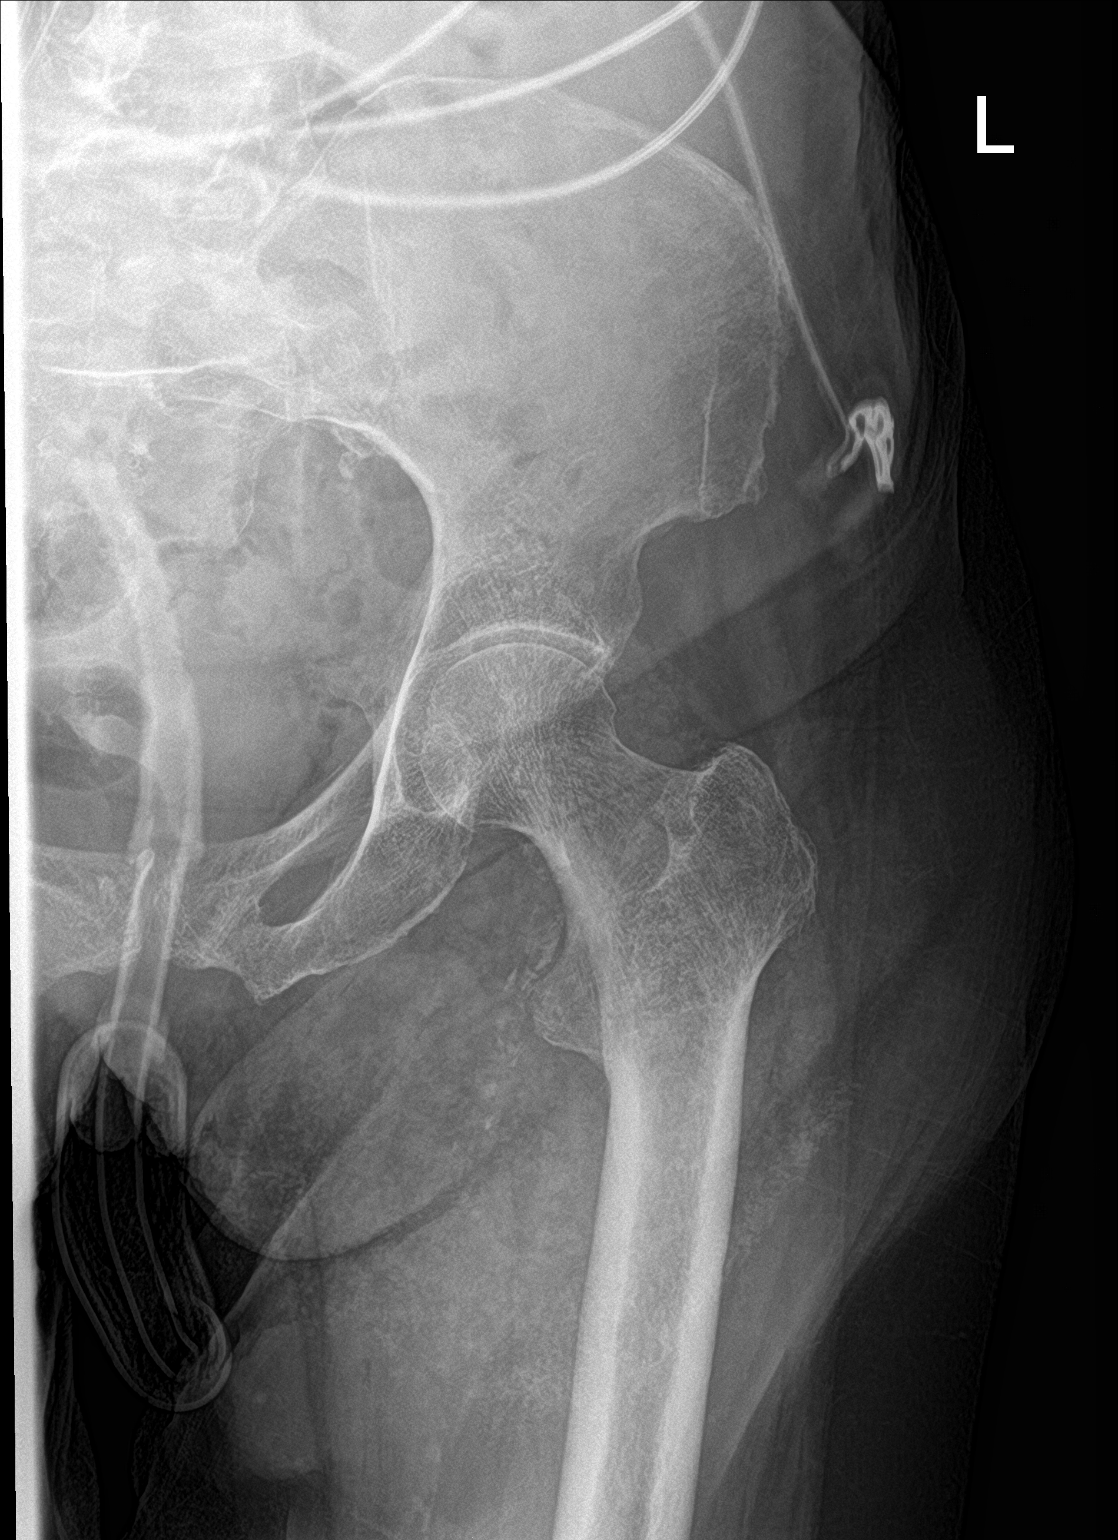

[hip lat]
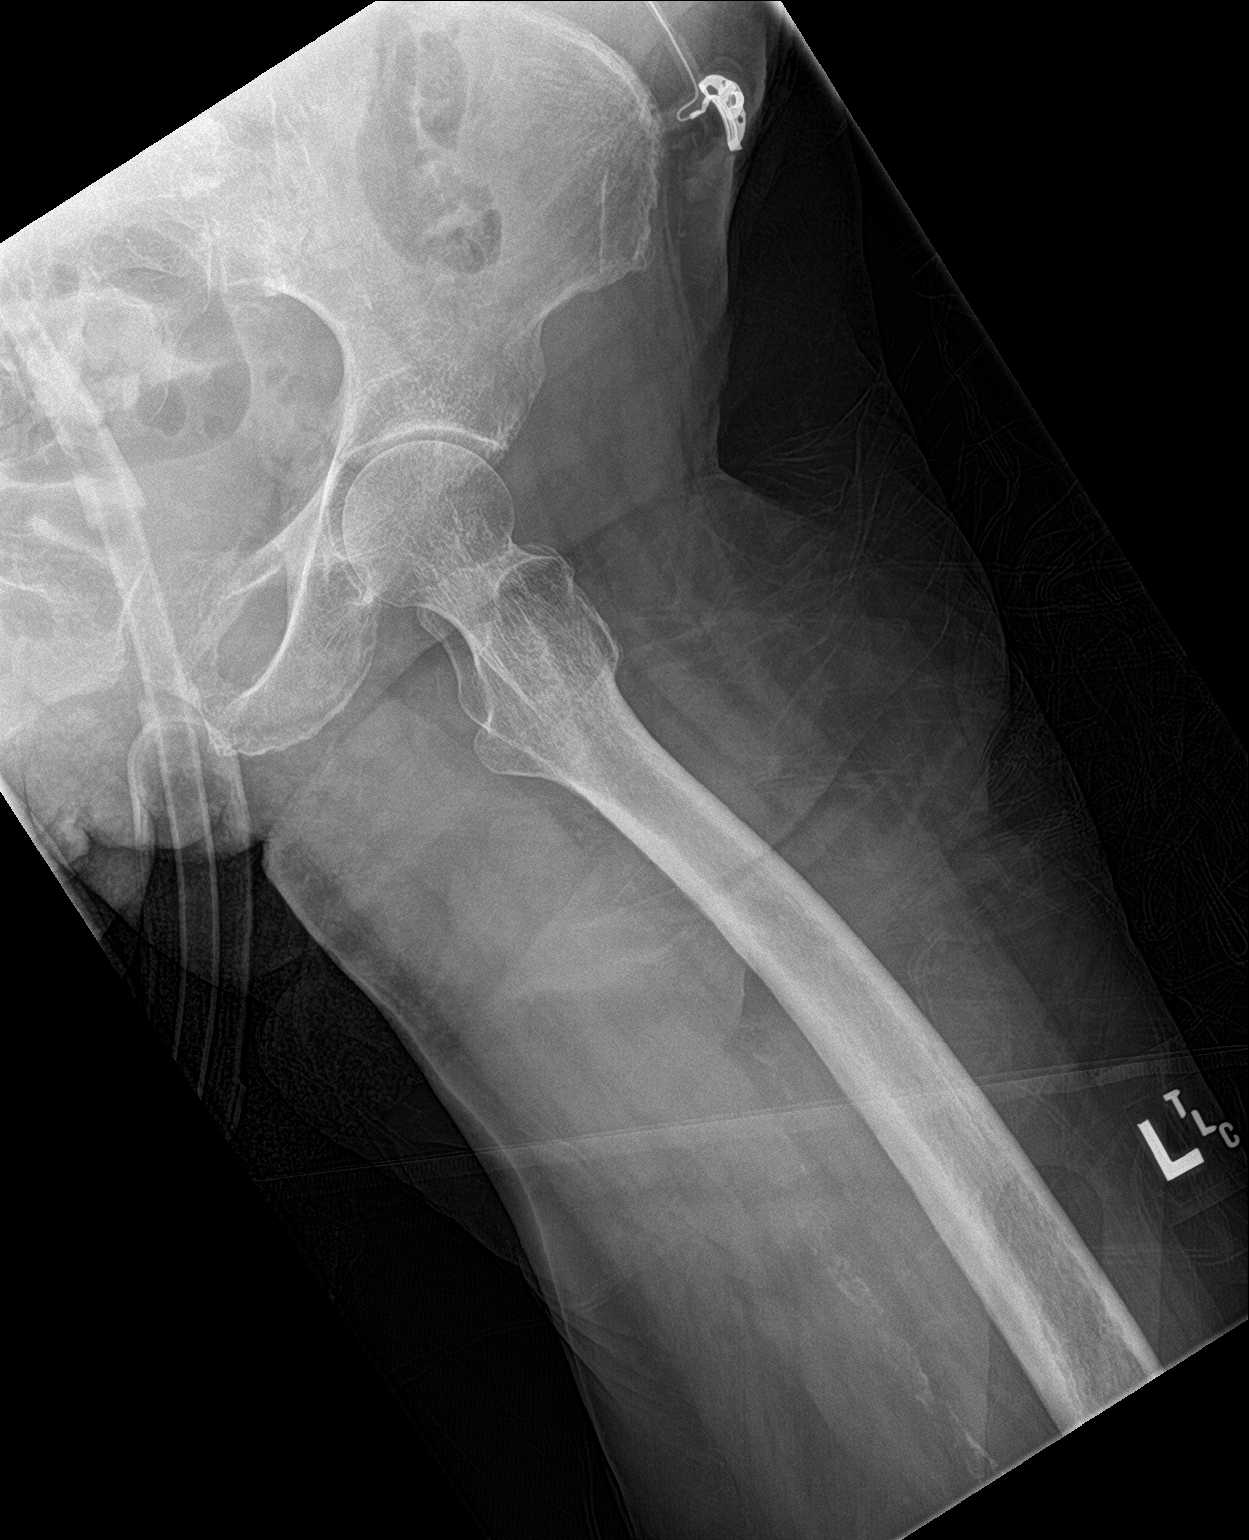

[3 of 3 positions shown; findings below may reference images not displayed]

FINDINGS: Hip joint space is relatively well maintained bilaterally. Mild
subchondral sclerosis along the acetabular roofs bilaterally. No
acute osseous abnormality. Degenerative changes in the spine.
IMPRESSION: 1. No acute findings.
2. Mild bilateral hip osteoarthritis.

## 2021-03-20 IMAGING — DX DG KNEE COMPLETE 4+V*L*
4 series · 4 of 4 positions shown · non-contrast
Comparison: None.

CLINICAL DATA: Left knee pain

EXAM:
LEFT KNEE - COMPLETE 4+ VIEW

[knee ap]
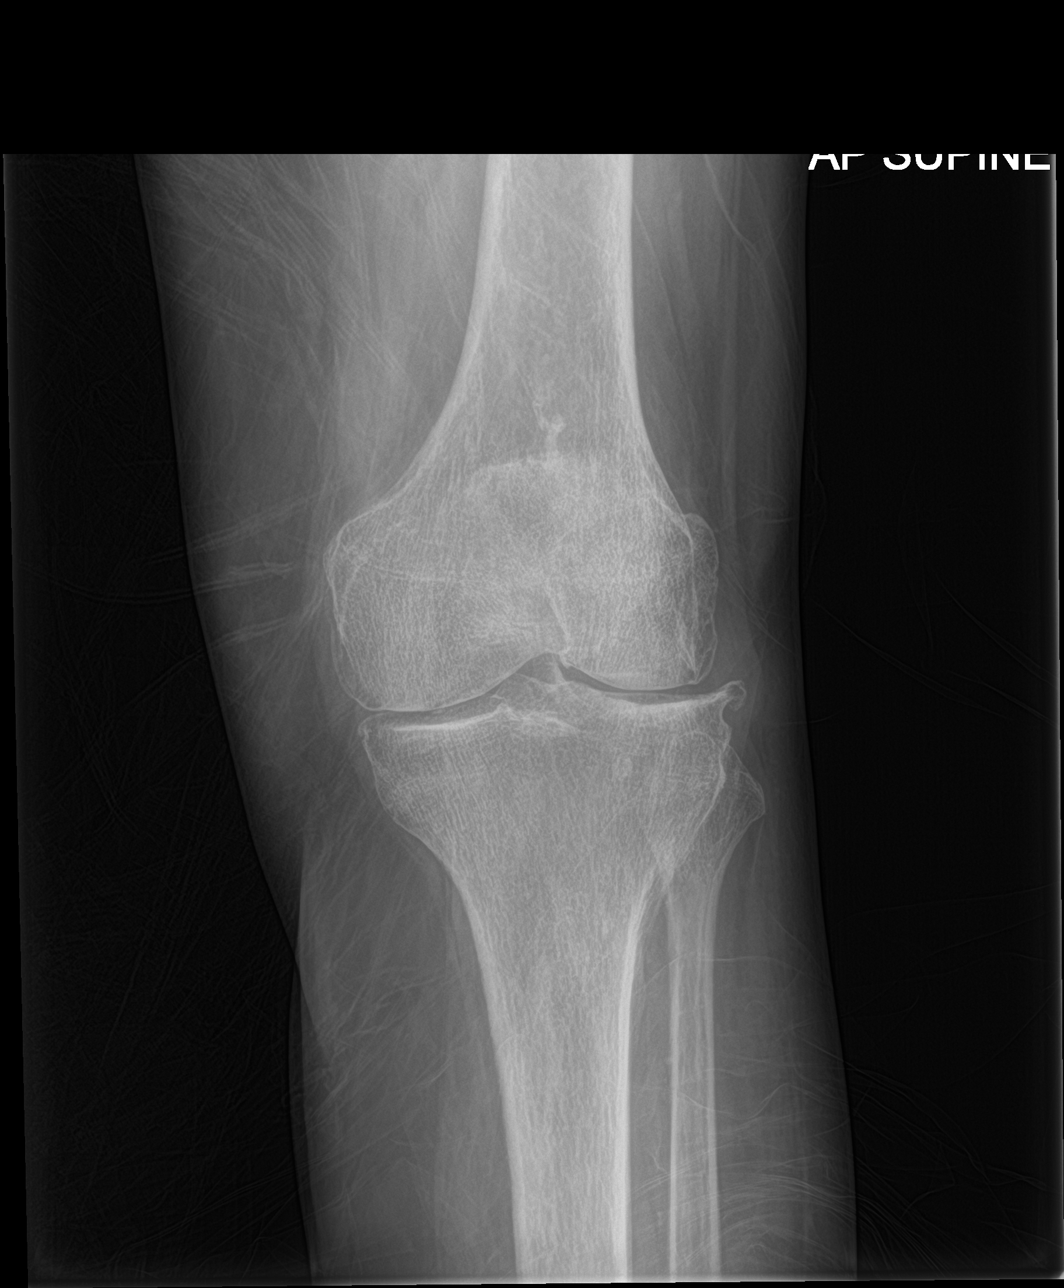

[knee lat]
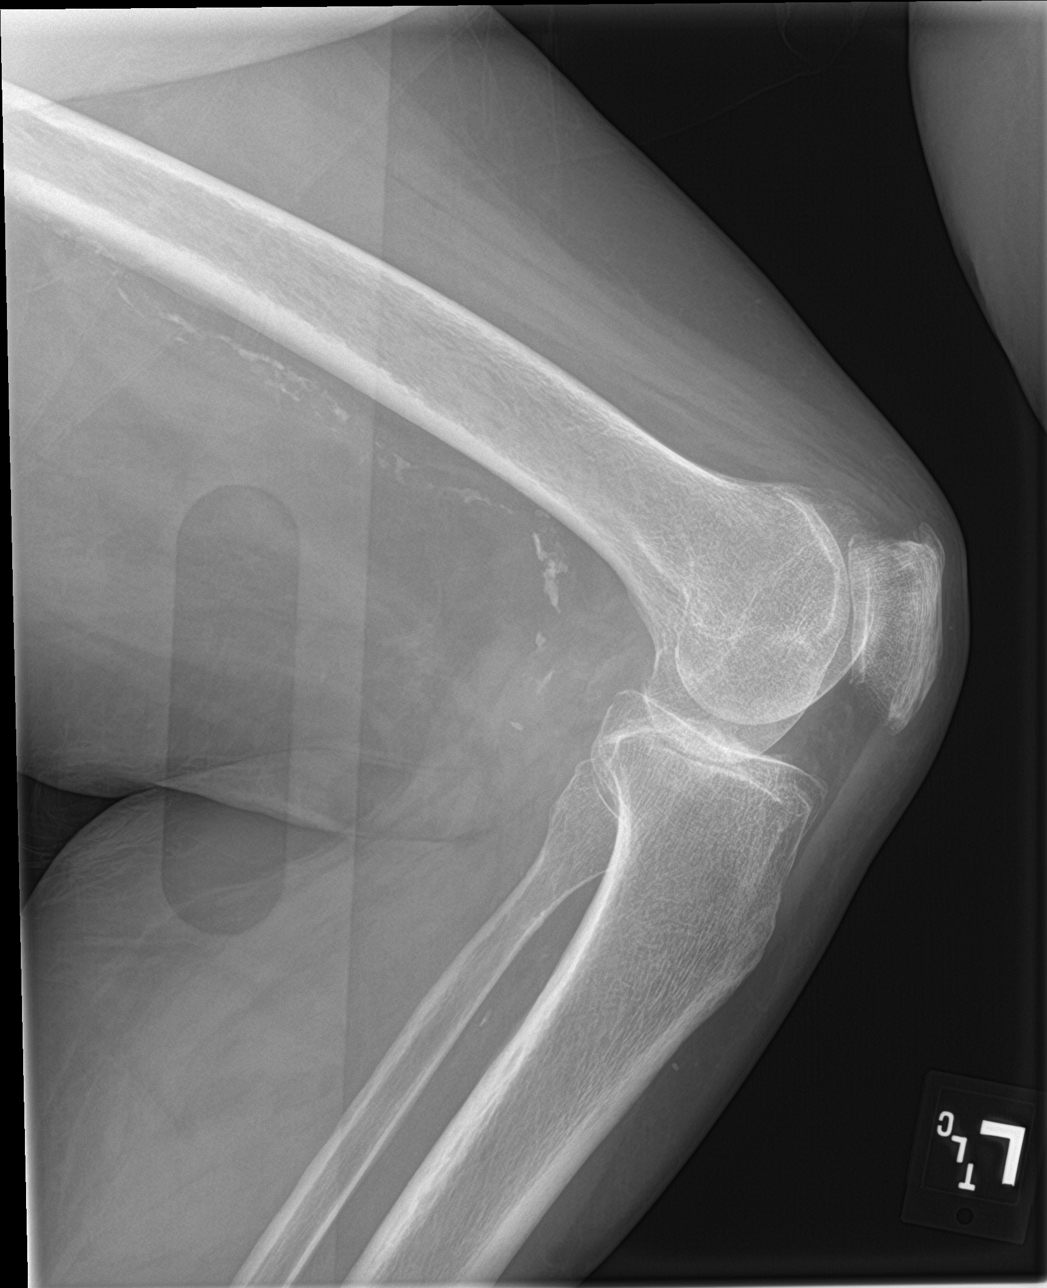

[knee obl (1 of 2)]
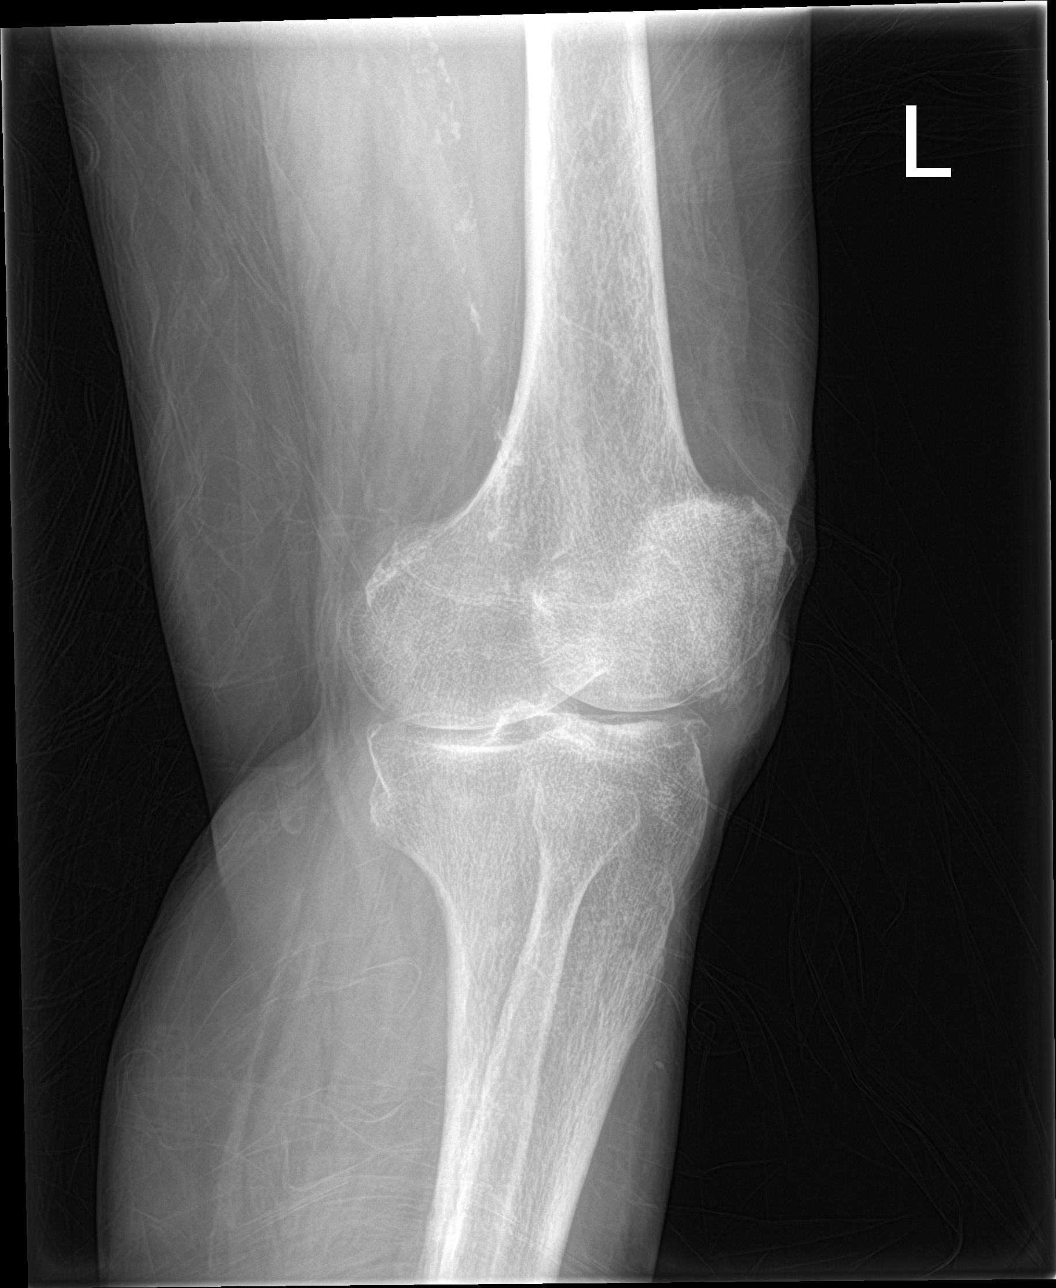

[knee obl (2 of 2)]
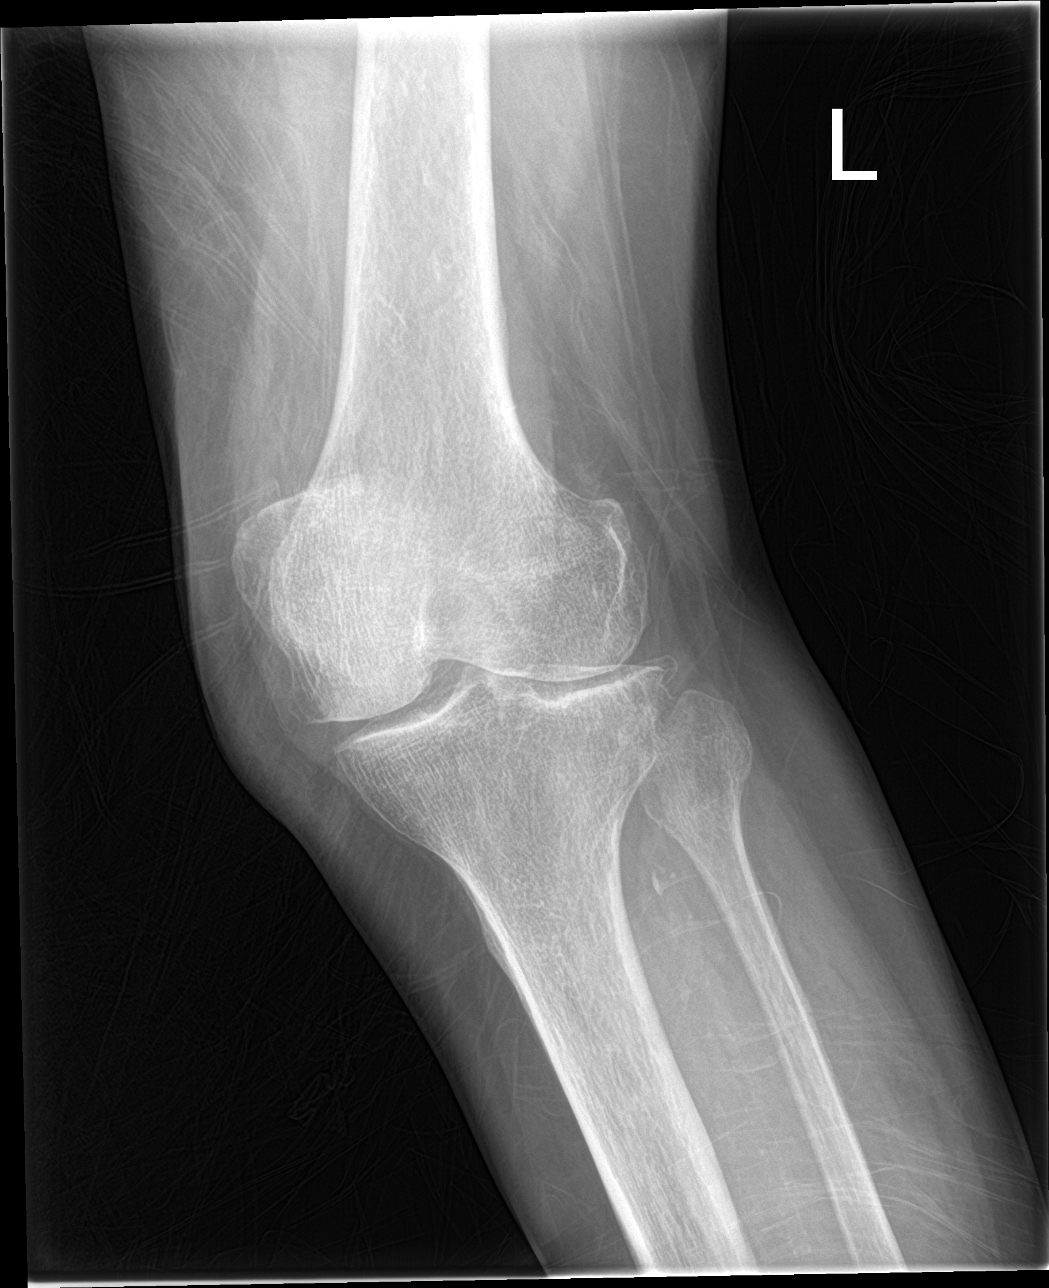

[4 of 4 positions shown; findings below may reference images not displayed]

FINDINGS: No evidence of fracture, dislocation, or joint effusion. Moderate
tricompartmental osteoarthritis. Prominent vascular calcifications.
IMPRESSION: 1. No acute osseous abnormality, left knee.
2. Moderate tricompartmental osteoarthritis.

## 2021-03-20 IMAGING — CT CT ANGIO AOBIFEM WO/W CM
1 of 7 series · 3 of 16 positions shown, 4 images · IV contrast (APPLIED)
Comparison: None.

CLINICAL DATA: Leg pain, arterial embolism

EXAM:
CT ANGIOGRAPHY OF ABDOMINAL AORTA WITH ILIOFEMORAL RUNOFF
TECHNIQUE: Multidetector CT imaging of the abdomen, pelvis and lower
extremities was performed using the standard protocol during bolus
administration of intravenous contrast. Multiplanar CT image
reconstructions and MIPs were obtained to evaluate the vascular
anatomy.
CONTRAST:  100mL OMNIPAQUE IOHEXOL 350 MG/ML SOLN

[Series 5: arterial · axial · arterial · 0.91mm/px · z∈[+330,+1002]mm · 3 of 672 slices shown, 4 images]
[im 168/672  soft-tissue]
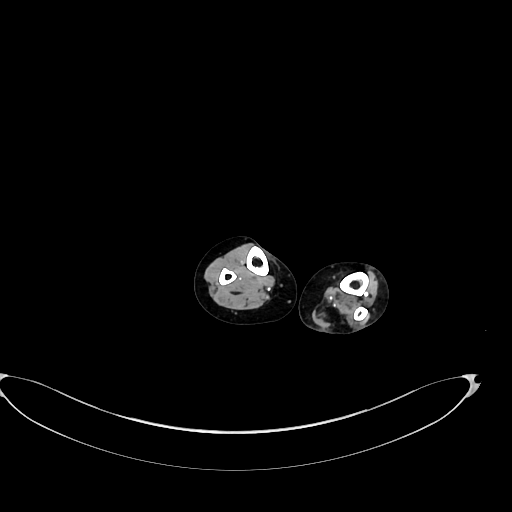
[im 168/672  bone]
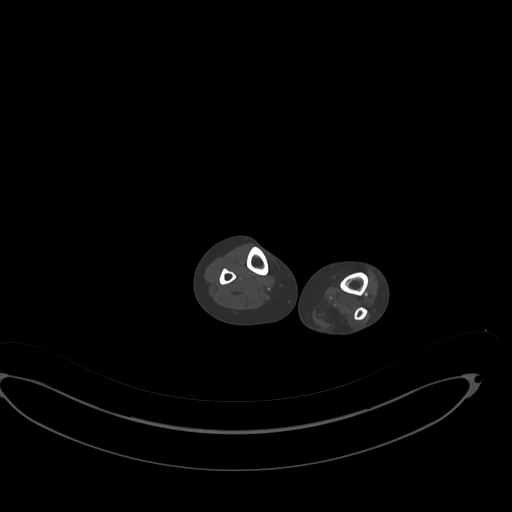
[im 336/672  soft-tissue]
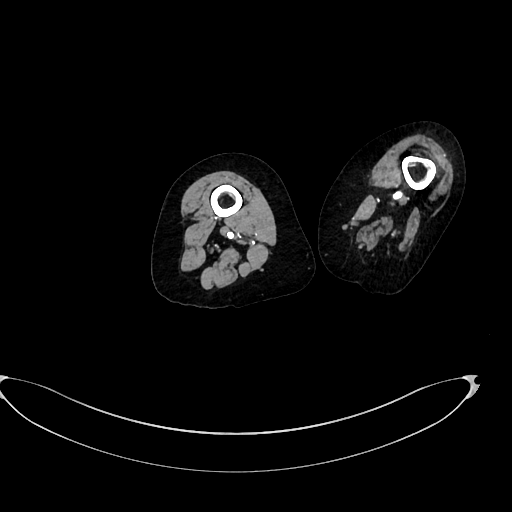
[im 504/672  soft-tissue]
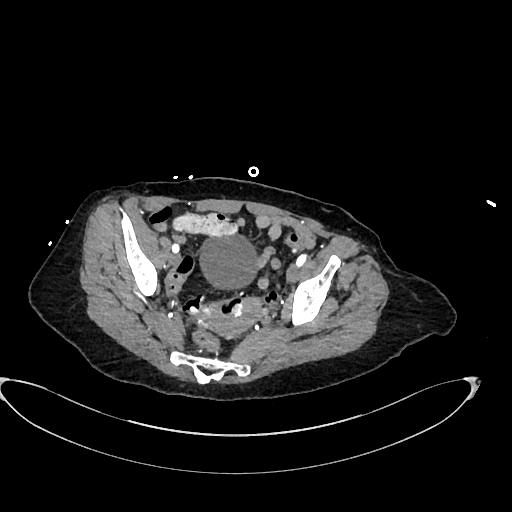

[3 of 16 positions shown; findings below may reference images not displayed]

FINDINGS: VASCULAR

Aorta: Moderate calcified atheromatous plaque. No aneurysm,
dissection, or stenosis.

Celiac: Partially calcified ostial plaque resulting in short segment
mild stenosis over length of less than 1 cm, patent distally with
unremarkable distal branching.

SMA: Calcified ostial plaque resulting in short-segment ostial
stenosis less than 1 cm of at least mild severity, widely patent
distally with classic distal branch anatomy.

Renals: Single left, with partially calcified ostial plaque and
short-segment stenosis of at least moderate severity, patent
distally. Single right, with calcified ostial plaque with only mild
short-segment stenosis, patent distally.

IMA: Patent without evidence of aneurysm, dissection, vasculitis or
significant stenosis.

RIGHT Lower Extremity

Inflow: Common iliac scattered calcified atheromatous plaque

Internal iliac atheromatous, patent

External iliac minimally atheromatous, patent

Outflow: Common femoral mildly atheromatous, patent

Deep femoral branches patent.

SFA scattered calcified plaque with tandem areas of mild stenosis.

Popliteal calcified plaque proximally with short-segment mild
stenosis, patent distally across the knee

Runoff: There is poor contrast opacification of vessels below the
distal calf but which may reflect early scan timing relative to the
arterial contrast bolus.

Anterior tibial occludes distal calf level.

Posterior tibial seen to above the ankle.

Peroneal is diminutive, seen to just above the ankle.

LEFT Lower Extremity

Inflow: Common iliac scattered calcified plaque without high-grade
stenosis, aneurysm or dissection.

Internal iliac occlusion just beyond its origin.

External iliac scattered plaque, no stenosis.

Outflow: Common femoral mildly atheromatous, patent.

Deep femoral branches patent.

SFA scattered calcified plaque with mild stenosis distally

Popliteal scattered calcified plaque with tandem mild stenoses above
the knee, patent distally

Runoff: Anterior and posterior tibial contiguous runoff across the
ankle.

Peroneal artery is diminutive, seen to the distal calf.

Veins: No obvious venous abnormality within the limitations of this
arterial phase study.

Review of the MIP images confirms the above findings.

NON-VASCULAR

Lower chest: Trace bilateral pleural fluid.

Hepatobiliary: No focal liver abnormality is seen. No gallstones,
gallbladder wall thickening, or biliary dilatation.

Pancreas: Unremarkable. No pancreatic ductal dilatation or
surrounding inflammatory changes.

Spleen: Normal in size without focal abnormality.

Adrenals/Urinary Tract: Adrenal glands are unremarkable. Kidneys are
normal, without renal calculi, focal lesion, or hydronephrosis.
Bladder is unremarkable.

Stomach/Bowel: Stomach decompressed. Small bowel nondilated,
unremarkable. Appendix not discretely identified. Mild proximal
colonic distension, decompressed distally with scattered sigmoid
diverticula; no significant adjacent inflammatory/edematous change.

Lymphatic: No abdominal or pelvic adenopathy.

Reproductive: Uterus and bilateral adnexa are unremarkable.

Other: No ascites.  No free air.

Musculoskeletal: Spondylitic changes in the lower lumbar spine.
Multilevel bilateral knee DJD involving primarily lateral
compartments. No fracture or worrisome bone lesion.
IMPRESSION: 1. Aortic atherosclerosis ([Q2]-170.0) with mild visceral and renal
ostial stenoses.
2. Scattered bilateral lower extremity arterial inflow and outflow
disease with no high-grade stenosis, embolus, or occlusion.
3. Limited assessment of distal right lower extremity tibial runoff
due to scan timing. Cannot confidently exclude distal tibial
occlusive or embolic disease.
4. Two-vessel left lower extremity tibial runoff.
5. Lower lumbar spondylitic change.
6. Sigmoid diverticulosis.

## 2021-03-20 MED ORDER — MORPHINE SULFATE (PF) 4 MG/ML IV SOLN
4.0000 mg | Freq: Once | INTRAVENOUS | Status: AC
  Administered 2021-03-20: 4 mg via INTRAVENOUS
  Filled 2021-03-20: qty 1

## 2021-03-20 MED ORDER — HYDROMORPHONE HCL 1 MG/ML IJ SOLN
0.5000 mg | Freq: Once | INTRAMUSCULAR | Status: AC
  Administered 2021-03-20: 0.5 mg via INTRAVENOUS
  Filled 2021-03-20: qty 1

## 2021-03-20 MED ORDER — IOHEXOL 350 MG/ML SOLN
100.0000 mL | Freq: Once | INTRAVENOUS | Status: AC | PRN
  Administered 2021-03-20: 100 mL via INTRAVENOUS

## 2021-03-20 NOTE — ED Triage Notes (Signed)
Pt BIB [facility] from home c/o left leg pain that started last night. Pt does have edema in lower leg, but is unable to straighten the leg. Pt does have a hx of stroke from 2021. Pt has a 20g in LAC. Pt received 100 mcg of fentanyl with EMS.

## 2021-03-20 NOTE — ED Notes (Signed)
Pt family member asked to speak with a MRI supervisor. Charge made aware and called MRI. MRI says they are sending someone for her. Family member made aware as well.

## 2021-03-20 NOTE — ED Notes (Signed)
Pt family member asking multiple times when pt would be going to MRI.   Called MRI to see when pt would be going. MRI states that pt should be probably be next. MD made aware.

## 2021-03-20 NOTE — ED Provider Notes (Signed)
[facility] EMERGENCY DEPARTMENT Provider Note   CSN: [id number] Arrival date & time: 03/20/21  1107     History Chief Complaint  Patient presents with  . Leg Pain    Nicole [name] is a 87 y.o. female.  HPI 87 year old female presents with severe left leg pain.  Started last night around 8 PM.  No trauma associated with it.  Pain is mostly in her groin/hip.  She was unable to walk down the stairs.  Normally she can walk with a walker and at the end of the day she is using a wheelchair.  Took some Tylenol and baclofen with no relief.  EMS gave fentanyl which did not seem to help much.  No fevers or other acute illness.  This is the side that her stroke affected. Daughter feels like her leg might be swollen. No back pain.   Past Medical History:  Diagnosis Date  . Hypertension   . Stroke [facility])    September 2021  . Thyroid disease     Patient Active Problem List   Diagnosis Date Noted  . Spasticity 11/30/2020  . Wheelchair dependence 10/07/2020  . Left knee pain 07/26/2020  . Cerebrovascular accident (CVA) of right basal ganglia (HCC) 07/26/2020  . Left hemiparesis (HCC) 07/25/2020  . Hypokalemia 07/20/2020  . Right basal ganglia embolic stroke (HCC) 07/20/2020  . Essential hypertension 07/20/2020  . Hypothyroidism 07/20/2020    History reviewed. No pertinent surgical history.   OB History   No obstetric history on file.     Family History  Problem Relation Age of Onset  . Diabetes Mother   . Stroke Mother   . Diabetes Other   . Hypertension Other     Social History   Tobacco Use  . Smoking status: Never Smoker  . Smokeless tobacco: Never Used  Vaping Use  . Vaping Use: Never used  Substance Use Topics  . Alcohol use: No    Alcohol/week: 0.0 standard drinks  . Drug use: No    Home Medications Prior to Admission medications   Medication Sig Start Date End Date Taking? Authorizing Provider  acetaminophen (TYLENOL) 325 MG tablet Take  2 tablets (650 mg total) by mouth every 8 (eight) hours. 08/16/20   Angiulli, Mcarthur Rossetti, PA-C  albuterol (VENTOLIN HFA) 108 (90 Base) MCG/ACT inhaler INHALE 1 PUFF INTO THE LUNGS EVERY FOUR HOURS AS NEEDED FOR WHEEZING OR SHORTNESS OF BREATH. 08/16/20 08/16/21  Angiulli, Mcarthur Rossetti, PA-C  atorvastatin (LIPITOR) 80 MG tablet TAKE 1 TABLET (80 MG TOTAL) BY MOUTH DAILY. 08/16/20 08/16/21  Angiulli, Mcarthur Rossetti, PA-C  baclofen (LIORESAL) 10 MG tablet Take 1 tablet (10 mg total) by mouth 4 (four) times daily. Take 1 pill in AM and 1 in afternoon and 1.5 to 2 pills at night- for spasticity 01/13/21   Lovorn, Aundra Millet, MD  cetirizine (ZYRTEC) 10 MG tablet Take 1 tablet (10 mg total) by mouth daily as needed for allergies or rhinitis. 07/26/20   Roberto Scales D, MD  Cholecalciferol (VITAMIN D-3 PO) Take 1 capsule by mouth daily.    [provider]  clopidogrel (PLAVIX) 75 MG tablet Take 1 tablet (75 mg total) by mouth daily. 10/18/20   Ihor Austin, NP  COVID-19 mRNA vaccine, Pfizer, 30 MCG/0.3ML injection Inject into the muscle. 02/10/21   Judyann Munson, MD  Cyanocobalamin (B-12) 1000 MCG TABS TAKE 1 TABLET BY MOUTH DAILY. 08/16/20 08/16/21  Angiulli, Mcarthur Rossetti, PA-C  diclofenac Sodium (VOLTAREN) 1 % GEL APPLY  2 G TOPICALLY FOUR TIMES DAILY AS NEEDED TO AFFECTED AREAS- FOR PAIN. 08/16/20 08/16/21  Angiulli, Mcarthur Rossetti, PA-C  levothyroxine (SYNTHROID) 50 MCG tablet Take 1 tablet (50 mcg total) by mouth daily before breakfast. 08/16/20   Angiulli, Mcarthur Rossetti, PA-C  lisinopril (ZESTRIL) 20 MG tablet TAKE 1 TABLET (20 MG TOTAL) BY MOUTH DAILY. 08/16/20 08/16/21  Angiulli, Mcarthur Rossetti, PA-C  polyethylene glycol (MIRALAX / GLYCOLAX) 17 g packet Take 17 g by mouth daily as needed. 07/26/20   Laverna Peace, MD  senna-docusate (SENOKOT-S) 8.6-50 MG tablet Take 2 tablets by mouth 2 (two) times daily. 07/26/20   Laverna Peace, MD  vitamin B-12 (CYANOCOBALAMIN) 250 MCG tablet Take 1 tablet (250 mcg total) by mouth daily.  08/16/20   Angiulli, Mcarthur Rossetti, PA-C  Vitamin D3 (VITAMIN D) 25 MCG tablet TAKE 1 TABLET (1,000 UNITS TOTAL) BY MOUTH DAILY. 08/16/20 08/16/21  Angiulli, Mcarthur Rossetti, PA-C    Allergies    Patient has no known allergies.  Review of Systems   Review of Systems  Constitutional: Negative for fever.  Cardiovascular: Positive for leg swelling.  Musculoskeletal: Positive for arthralgias.  Neurological: Positive for weakness (chronic, left sided). Negative for numbness.  All other systems reviewed and are negative.   Physical Exam Updated Vital Signs BP (!) 167/80   Pulse 66   Temp 98 F (36.7 C) (Oral)   Resp 10   Ht 5\' 4"  (1.626 m)   Wt 61.2 kg   SpO2 91%   BMI 23.17 kg/m   Physical Exam Vitals and nursing note reviewed.  Constitutional:      Appearance: She is well-developed. She is not diaphoretic.  HENT:     Head: Normocephalic and atraumatic.     Right Ear: External ear normal.     Left Ear: External ear normal.     Nose: Nose normal.  Eyes:     General:        Right eye: No discharge.        Left eye: No discharge.  Cardiovascular:     Rate and Rhythm: Normal rate and regular rhythm.     Pulses:          Dorsalis pedis pulses are detected w/ Doppler on the right side and detected w/ Doppler on the left side.       Posterior tibial pulses are detected w/ Doppler on the left side.  Pulmonary:     Effort: Pulmonary effort is normal.     Breath sounds: Normal breath sounds.  Abdominal:     Palpations: Abdomen is soft.     Tenderness: There is no abdominal tenderness.  Musculoskeletal:     Left hip: Tenderness present. No deformity. Decreased range of motion.     Left upper leg: No swelling, edema or tenderness.     Left knee: No deformity. Decreased range of motion. Tenderness present.     Left lower leg: No swelling, deformity or tenderness.     Comments: Difficult to examine left leg due to pain. She holds her knee/hip in flexion. Is painful to move either. No  obvious joint effusion. No increased warmth  Skin:    General: Skin is warm and dry.  Neurological:     Mental Status: She is alert.  Psychiatric:        Mood and Affect: Mood is not anxious.     ED Results / Procedures / Treatments   Labs (all labs ordered are listed, but only abnormal  results are displayed) Labs Reviewed  BASIC METABOLIC PANEL  CBC WITH DIFFERENTIAL/PLATELET    EKG None  Radiology DG Knee Complete 4 Views Left  Result Date: 03/20/2021 CLINICAL DATA:  Left knee pain EXAM: LEFT KNEE - COMPLETE 4+ VIEW COMPARISON:  None. FINDINGS: No evidence of fracture, dislocation, or joint effusion. Moderate tricompartmental osteoarthritis. Prominent vascular calcifications. IMPRESSION: 1. No acute osseous abnormality, left knee. 2. Moderate tricompartmental osteoarthritis. Electronically Signed   By: Duanne Guess D.O.   On: 03/20/2021 13:51   DG Hip Unilat W or Wo Pelvis 2-3 Views Left  Result Date: 03/20/2021 CLINICAL DATA:  Acute left hip and leg pain, no reported injury. EXAM: DG HIP (WITH OR WITHOUT PELVIS) 2-3V LEFT COMPARISON:  None. FINDINGS: Hip joint space is relatively well maintained bilaterally. Mild subchondral sclerosis along the acetabular roofs bilaterally. No acute osseous abnormality. Degenerative changes in the spine. IMPRESSION: 1. No acute findings. 2. Mild bilateral hip osteoarthritis. Electronically Signed   By: Leanna Battles M.D.   On: 03/20/2021 13:48    Procedures Procedures   Medications Ordered in ED Medications  morphine 4 MG/ML injection 4 mg (4 mg Intravenous Given 03/20/21 1301)  HYDROmorphone (DILAUDID) injection 0.5 mg (0.5 mg Intravenous Given 03/20/21 1415)  iohexol (OMNIPAQUE) 350 MG/ML injection 100 mL (100 mLs Intravenous Contrast Given 03/20/21 1508)    ED Course  I have reviewed the triage vital signs and the nursing notes.  Pertinent labs & imaging results that were available during my care of the patient were reviewed by me  and considered in my medical decision making (see chart for details).    MDM Rules/Calculators/A&P                          Patient presents with severe leg pain and is difficult to move her extremity.  No falls or trauma.  X-rays do not show any obvious fracture.  No obvious joint effusions or cellulitis.  However given this intractable pain, I decided to get a CTA to help rule out acute arterial occlusion or partial occlusion that could be contributing.  We will also help with deep space infection or injury. Care to Dr. Wilkie Aye. Final Clinical Impression(s) / ED Diagnoses Final diagnoses:  None    Rx / DC Orders ED Discharge Orders    None       Pricilla Loveless, MD 03/20/21 1528

## 2021-03-20 NOTE — Telephone Encounter (Signed)
[name] (daughter) called: Nicole [name] has been having left hip pain down to her left foot, since last yesterday. Pain is so bad she will not move in bed. The areas are even painful to the touch. Left foot was swollen last night. But looks better today.  She tried heat, ice and taken her medications as prescribed. Nothing helps.   Per Dr. Berline Chough patient will need to go an Urgent Care or ER for further evaluation.  [name] has been advised and understood. - Call back ph [phone].

## 2021-03-20 NOTE — ED Notes (Signed)
RN called MRI regarding wait time. RN was told that there was only 1 MRI tech and another one would be coming in at 2100 and hopefully pt will be sent for then. Family member and pt made aware.

## 2021-03-20 NOTE — ED Notes (Signed)
Pt transported to MRI at this time 

## 2021-03-20 NOTE — ED Provider Notes (Addendum)
Patient signed out to me by previous provider.  Please refer to their note for full HPI.  Briefly this is an 87 year old female who presented with sudden atraumatic left hip/groin pain.  Patient typically uses a walker, does have stairs in the home.  Since the pain onset she has been nonambulatory.  Only mild relief with IV opiates.  X-rays did not show any acute finding.  Pending CTA to rule out vascular pathology.  Patient and daughter continues to deny any back pain, she does not have any focal lumbar pain on evaluation.  No other systemic symptoms. Physical Exam  BP 136/67 (BP Location: Left Arm)   Pulse 75   Temp 98 F (36.7 C) (Oral)   Resp 15   Ht 5\' 4"  (1.626 m)   Wt 61.2 kg   SpO2 95%   BMI 23.17 kg/m   Physical Exam Vitals and nursing note reviewed.  Constitutional:      Appearance: Normal appearance.  HENT:     Head: Normocephalic.     Mouth/Throat:     Mouth: Mucous membranes are moist.  Cardiovascular:     Rate and Rhythm: Normal rate.  Pulmonary:     Effort: Pulmonary effort is normal. No respiratory distress.  Abdominal:     Palpations: Abdomen is soft.     Tenderness: There is no abdominal tenderness.  Musculoskeletal:     Comments: Left hip and groin is very tender to palpation and any movement, the leg appears vascularly intact, she has chronic contractures from previous stroke.  Skin:    General: Skin is warm.  Neurological:     Mental Status: She is alert and oriented to person, place, and time. Mental status is at baseline.  Psychiatric:        Mood and Affect: Mood normal.     ED Course/Procedures     Procedures  MDM  CT study shows no acute vascular finding that could explain her symptoms.  On reevaluation patient continues to have severe pain.  Of note she is hard of hearing and poor historian based off of that.  Patient is now nonambulatory secondary to the pain, unsafe to discharge home.  Plan will be for MRI of the hip.  If this finds no acute  abnormalities patient will most likely require PT evaluation and transition of care consultation.  Daughter at bedside understands.       , DO 03/20/21 2307    2308, DO 03/20/21 2338

## 2021-03-21 DIAGNOSIS — R2689 Other abnormalities of gait and mobility: Secondary | ICD-10-CM | POA: Diagnosis not present

## 2021-03-21 DIAGNOSIS — Z8249 Family history of ischemic heart disease and other diseases of the circulatory system: Secondary | ICD-10-CM | POA: Diagnosis not present

## 2021-03-21 DIAGNOSIS — R404 Transient alteration of awareness: Secondary | ICD-10-CM | POA: Diagnosis not present

## 2021-03-21 DIAGNOSIS — H9190 Unspecified hearing loss, unspecified ear: Secondary | ICD-10-CM | POA: Diagnosis present

## 2021-03-21 DIAGNOSIS — R269 Unspecified abnormalities of gait and mobility: Secondary | ICD-10-CM | POA: Diagnosis present

## 2021-03-21 DIAGNOSIS — Z8673 Personal history of transient ischemic attack (TIA), and cerebral infarction without residual deficits: Secondary | ICD-10-CM | POA: Diagnosis not present

## 2021-03-21 DIAGNOSIS — Z79899 Other long term (current) drug therapy: Secondary | ICD-10-CM | POA: Diagnosis not present

## 2021-03-21 DIAGNOSIS — Z79890 Hormone replacement therapy (postmenopausal): Secondary | ICD-10-CM | POA: Diagnosis not present

## 2021-03-21 DIAGNOSIS — S76012A Strain of muscle, fascia and tendon of left hip, initial encounter: Secondary | ICD-10-CM | POA: Diagnosis not present

## 2021-03-21 DIAGNOSIS — M160 Bilateral primary osteoarthritis of hip: Secondary | ICD-10-CM | POA: Diagnosis present

## 2021-03-21 DIAGNOSIS — G8194 Hemiplegia, unspecified affecting left nondominant side: Secondary | ICD-10-CM | POA: Diagnosis not present

## 2021-03-21 DIAGNOSIS — R41841 Cognitive communication deficit: Secondary | ICD-10-CM | POA: Diagnosis not present

## 2021-03-21 DIAGNOSIS — Z823 Family history of stroke: Secondary | ICD-10-CM | POA: Diagnosis not present

## 2021-03-21 DIAGNOSIS — M25559 Pain in unspecified hip: Secondary | ICD-10-CM | POA: Diagnosis not present

## 2021-03-21 DIAGNOSIS — E039 Hypothyroidism, unspecified: Secondary | ICD-10-CM

## 2021-03-21 DIAGNOSIS — I10 Essential (primary) hypertension: Secondary | ICD-10-CM

## 2021-03-21 DIAGNOSIS — M6281 Muscle weakness (generalized): Secondary | ICD-10-CM | POA: Diagnosis not present

## 2021-03-21 DIAGNOSIS — M6638 Spontaneous rupture of flexor tendons, other site: Secondary | ICD-10-CM | POA: Diagnosis present

## 2021-03-21 DIAGNOSIS — S76012D Strain of muscle, fascia and tendon of left hip, subsequent encounter: Secondary | ICD-10-CM | POA: Diagnosis not present

## 2021-03-21 DIAGNOSIS — R2681 Unsteadiness on feet: Secondary | ICD-10-CM | POA: Diagnosis not present

## 2021-03-21 DIAGNOSIS — R1312 Dysphagia, oropharyngeal phase: Secondary | ICD-10-CM | POA: Diagnosis not present

## 2021-03-21 DIAGNOSIS — Z7902 Long term (current) use of antithrombotics/antiplatelets: Secondary | ICD-10-CM | POA: Diagnosis not present

## 2021-03-21 DIAGNOSIS — Z20822 Contact with and (suspected) exposure to covid-19: Secondary | ICD-10-CM | POA: Diagnosis present

## 2021-03-21 DIAGNOSIS — J45909 Unspecified asthma, uncomplicated: Secondary | ICD-10-CM | POA: Diagnosis present

## 2021-03-21 DIAGNOSIS — I69354 Hemiplegia and hemiparesis following cerebral infarction affecting left non-dominant side: Secondary | ICD-10-CM | POA: Diagnosis not present

## 2021-03-21 DIAGNOSIS — R262 Difficulty in walking, not elsewhere classified: Secondary | ICD-10-CM | POA: Diagnosis not present

## 2021-03-21 DIAGNOSIS — M25552 Pain in left hip: Secondary | ICD-10-CM | POA: Diagnosis not present

## 2021-03-21 LAB — RESP PANEL BY RT-PCR (FLU A&B, COVID) ARPGX2
Influenza A by PCR: NEGATIVE
Influenza B by PCR: NEGATIVE
SARS Coronavirus 2 by RT PCR: NEGATIVE

## 2021-03-21 MED ORDER — LORATADINE 10 MG PO TABS
10.0000 mg | ORAL_TABLET | Freq: Every day | ORAL | Status: DC
  Administered 2021-03-21 – 2021-03-29 (×9): 10 mg via ORAL
  Filled 2021-03-21 (×9): qty 1

## 2021-03-21 MED ORDER — HYDROMORPHONE HCL 1 MG/ML IJ SOLN
0.5000 mg | Freq: Once | INTRAMUSCULAR | Status: AC
  Administered 2021-03-21: 0.5 mg via INTRAVENOUS
  Filled 2021-03-21: qty 1

## 2021-03-21 MED ORDER — HYDROCHLOROTHIAZIDE 25 MG PO TABS
25.0000 mg | ORAL_TABLET | Freq: Every day | ORAL | Status: DC
  Administered 2021-03-22 – 2021-03-29 (×8): 25 mg via ORAL
  Filled 2021-03-21 (×9): qty 1

## 2021-03-21 MED ORDER — LISINOPRIL 20 MG PO TABS
20.0000 mg | ORAL_TABLET | Freq: Every day | ORAL | Status: DC
  Administered 2021-03-21 – 2021-03-29 (×9): 20 mg via ORAL
  Filled 2021-03-21 (×9): qty 1

## 2021-03-21 MED ORDER — ONDANSETRON HCL 4 MG/2ML IJ SOLN: 4.0000 mg | Freq: Four times a day (QID) | INTRAMUSCULAR | Status: DC | PRN

## 2021-03-21 MED ORDER — BACLOFEN 10 MG PO TABS
10.0000 mg | ORAL_TABLET | Freq: Three times a day (TID) | ORAL | Status: DC | PRN
  Administered 2021-03-21 – 2021-03-24 (×3): 10 mg via ORAL
  Filled 2021-03-21 (×4): qty 1

## 2021-03-21 MED ORDER — OXYBUTYNIN CHLORIDE ER 5 MG PO TB24
5.0000 mg | ORAL_TABLET | Freq: Every day | ORAL | Status: DC
  Administered 2021-03-21 – 2021-03-29 (×9): 5 mg via ORAL
  Filled 2021-03-21 (×11): qty 1

## 2021-03-21 MED ORDER — HYDROMORPHONE HCL 1 MG/ML IJ SOLN
0.5000 mg | INTRAMUSCULAR | Status: DC | PRN
  Administered 2021-03-21: 0.5 mg via INTRAVENOUS
  Filled 2021-03-21: qty 0.5

## 2021-03-21 MED ORDER — ATORVASTATIN CALCIUM 80 MG PO TABS
80.0000 mg | ORAL_TABLET | Freq: Every day | ORAL | Status: DC
  Administered 2021-03-21 – 2021-03-29 (×9): 80 mg via ORAL
  Filled 2021-03-21 (×9): qty 1

## 2021-03-21 MED ORDER — ACETAMINOPHEN 500 MG PO TABS
1000.0000 mg | ORAL_TABLET | Freq: Three times a day (TID) | ORAL | Status: DC
  Administered 2021-03-21 – 2021-03-29 (×25): 1000 mg via ORAL
  Filled 2021-03-21 (×25): qty 2

## 2021-03-21 MED ORDER — OXYCODONE HCL 5 MG PO TABS
5.0000 mg | ORAL_TABLET | Freq: Four times a day (QID) | ORAL | Status: DC | PRN
  Administered 2021-03-21 – 2021-03-25 (×5): 5 mg via ORAL
  Filled 2021-03-21 (×5): qty 1

## 2021-03-21 MED ORDER — IRBESARTAN 150 MG PO TABS: 150.0000 mg | ORAL_TABLET | Freq: Every day | ORAL | Status: DC

## 2021-03-21 MED ORDER — SENNOSIDES-DOCUSATE SODIUM 8.6-50 MG PO TABS: 2.0000 | ORAL_TABLET | Freq: Every evening | ORAL | Status: DC | PRN

## 2021-03-21 MED ORDER — ALBUTEROL SULFATE HFA 108 (90 BASE) MCG/ACT IN AERS: 1.0000 | INHALATION_SPRAY | RESPIRATORY_TRACT | Status: DC | PRN

## 2021-03-21 MED ORDER — ENOXAPARIN SODIUM 40 MG/0.4ML IJ SOSY
40.0000 mg | PREFILLED_SYRINGE | INTRAMUSCULAR | Status: DC
  Administered 2021-03-21 – 2021-03-29 (×9): 40 mg via SUBCUTANEOUS
  Filled 2021-03-21 (×9): qty 0.4

## 2021-03-21 MED ORDER — AMLODIPINE BESYLATE 10 MG PO TABS
10.0000 mg | ORAL_TABLET | Freq: Every day | ORAL | Status: DC
  Administered 2021-03-21 – 2021-03-29 (×9): 10 mg via ORAL
  Filled 2021-03-21 (×9): qty 1

## 2021-03-21 MED ORDER — LEVOTHYROXINE SODIUM 50 MCG PO TABS
50.0000 ug | ORAL_TABLET | Freq: Every day | ORAL | Status: DC
  Administered 2021-03-21 – 2021-03-29 (×9): 50 ug via ORAL
  Filled 2021-03-21 (×5): qty 1
  Filled 2021-03-21: qty 2
  Filled 2021-03-21 (×3): qty 1

## 2021-03-21 MED ORDER — POLYETHYLENE GLYCOL 3350 17 G PO PACK
17.0000 g | PACK | Freq: Every day | ORAL | Status: DC
  Administered 2021-03-21 – 2021-03-28 (×8): 17 g via ORAL
  Filled 2021-03-21 (×9): qty 1

## 2021-03-21 MED ORDER — PANTOPRAZOLE SODIUM 40 MG PO TBEC
40.0000 mg | DELAYED_RELEASE_TABLET | Freq: Every day | ORAL | Status: DC
  Administered 2021-03-21 – 2021-03-29 (×9): 40 mg via ORAL
  Filled 2021-03-21 (×9): qty 1

## 2021-03-21 MED ORDER — CLOPIDOGREL BISULFATE 75 MG PO TABS
75.0000 mg | ORAL_TABLET | Freq: Every day | ORAL | Status: DC
  Administered 2021-03-21 – 2021-03-29 (×9): 75 mg via ORAL
  Filled 2021-03-21 (×9): qty 1

## 2021-03-21 NOTE — ED Notes (Signed)
Attempted to call report. RN states that they are capped for the night. Report can be given on day shift. ED Charge made aware. Will make next shift aware.

## 2021-03-21 NOTE — H&P (Signed)
History and Physical    Nicole [name]:[id number] DOB: Oct 13, 1930 DOA: 03/20/2021  PCP: Geoffry Paradise, MD Patient coming from: Home  Chief Complaint: Left hip and leg pain  HPI: Nicole [name] is a 87 y.o. female with medical history significant of hypertension, history of CVA with residual left hemiparesis, hypothyroidism presented to the ED with complaints of atraumatic left hip and leg pain.  In the ED, blood pressure elevated on arrival but remainder of vital signs stable.  Labs showing WBC 7.3, hemoglobin 12.9, platelet count 220K.  Sodium 139, potassium 4.2, chloride 105, bicarb 27, BUN 14, creatinine 0.7, glucose 97.  Screening COVID and influenza PCR pending.  X-ray of hip/pelvis showing mild bilateral hip osteoarthritis; no acute findings.  X-ray of left knee showing moderate tricompartmental osteoarthritis; no acute findings.  CTA showing scattered bilateral lower extremity arterial inflow and outflow disease with no high-grade stenosis, embolus, or occlusion. MRI of left hip (per preliminary report under PACS) showing avulsion of the left iliopsoas tendon with retraction of the tendon by 5.8 cm.  No fracture or dislocation.  Small left hip effusion.  Mild left hip degenerative arthritis. Patient was given Dilaudid and morphine.  Patient is hard of hearing.  History provided mostly by her daughter at bedside who states for the past 24 hours patient has been complaining of severe pain in her left hip and leg.  No recent falls or injuries reported.  Daughter states patient had a stroke last year in September which left her with left-sided weakness.  Since after the stroke she is using a walker to ambulate and goes to physical therapy.  Patient lives with her daughter but prefers her room to be upstairs.  Daughter states patient has a lot of difficulty when coming down the steps each time.  She is not able to walk down the steps.  Instead she sits down and then slides her buttock down each  step.  She thinks this may have caused her hip problem.  She is fully vaccinated against COVID.  She has chronic cough due to asthma.  No fevers, shortness of breath, chest pain, nausea, vomiting, abdominal pain, or diarrhea.  No other complaints.  Review of Systems:  All systems reviewed and apart from history of presenting illness, are negative.  Past Medical History:  Diagnosis Date  . Hypertension   . Stroke [facility])    September 2021  . Thyroid disease     History reviewed. No pertinent surgical history.   reports that she has never smoked. She has never used smokeless tobacco. She reports that she does not drink alcohol and does not use drugs.  No Known Allergies  Family History  Problem Relation Age of Onset  . Diabetes Mother   . Stroke Mother   . Diabetes Other   . Hypertension Other     Prior to Admission medications   Medication Sig Start Date End Date Taking? Authorizing Provider  acetaminophen (TYLENOL) 325 MG tablet Take 2 tablets (650 mg total) by mouth every 8 (eight) hours. 08/16/20   Angiulli, Mcarthur Rossetti, PA-C  albuterol (VENTOLIN HFA) 108 (90 Base) MCG/ACT inhaler INHALE 1 PUFF INTO THE LUNGS EVERY FOUR HOURS AS NEEDED FOR WHEEZING OR SHORTNESS OF BREATH. 08/16/20 08/16/21  Angiulli, Mcarthur Rossetti, PA-C  atorvastatin (LIPITOR) 80 MG tablet TAKE 1 TABLET (80 MG TOTAL) BY MOUTH DAILY. 08/16/20 08/16/21  Angiulli, Mcarthur Rossetti, PA-C  baclofen (LIORESAL) 10 MG tablet Take 1 tablet (10 mg total) by mouth 4 (  four) times daily. Take 1 pill in AM and 1 in afternoon and 1.5 to 2 pills at night- for spasticity 01/13/21   Lovorn, Aundra Millet, MD  cetirizine (ZYRTEC) 10 MG tablet Take 1 tablet (10 mg total) by mouth daily as needed for allergies or rhinitis. 07/26/20   Roberto Scales D, MD  Cholecalciferol (VITAMIN D-3 PO) Take 1 capsule by mouth daily.    [provider]  clopidogrel (PLAVIX) 75 MG tablet Take 1 tablet (75 mg total) by mouth daily. 10/18/20   Ihor Austin, NP   COVID-19 mRNA vaccine, Pfizer, 30 MCG/0.3ML injection Inject into the muscle. 02/10/21   Judyann Munson, MD  Cyanocobalamin (B-12) 1000 MCG TABS TAKE 1 TABLET BY MOUTH DAILY. 08/16/20 08/16/21  Angiulli, Mcarthur Rossetti, PA-C  diclofenac Sodium (VOLTAREN) 1 % GEL APPLY 2 G TOPICALLY FOUR TIMES DAILY AS NEEDED TO AFFECTED AREAS- FOR PAIN. 08/16/20 08/16/21  Angiulli, Mcarthur Rossetti, PA-C  levothyroxine (SYNTHROID) 50 MCG tablet Take 1 tablet (50 mcg total) by mouth daily before breakfast. 08/16/20   Angiulli, Mcarthur Rossetti, PA-C  lisinopril (ZESTRIL) 20 MG tablet TAKE 1 TABLET (20 MG TOTAL) BY MOUTH DAILY. 08/16/20 08/16/21  Angiulli, Mcarthur Rossetti, PA-C  polyethylene glycol (MIRALAX / GLYCOLAX) 17 g packet Take 17 g by mouth daily as needed. 07/26/20   Laverna Peace, MD  senna-docusate (SENOKOT-S) 8.6-50 MG tablet Take 2 tablets by mouth 2 (two) times daily. 07/26/20   Laverna Peace, MD  vitamin B-12 (CYANOCOBALAMIN) 250 MCG tablet Take 1 tablet (250 mcg total) by mouth daily. 08/16/20   Angiulli, Mcarthur Rossetti, PA-C  Vitamin D3 (VITAMIN D) 25 MCG tablet TAKE 1 TABLET (1,000 UNITS TOTAL) BY MOUTH DAILY. 08/16/20 08/16/21  Charlton Amor, PA-C    Physical Exam: Vitals:   03/20/21 1615 03/20/21 1630 03/20/21 2145 03/21/21 0056  BP: (!) 199/95 (!) 173/83 136/67 (!) 164/89  Pulse: 78 79 75 81  Resp: 13 12 15 16   Temp:    98.1 F (36.7 C)  TempSrc:    Oral  SpO2: 94% 91% 95% 94%  Weight:      Height:        Physical Exam Constitutional:      General: She is not in acute distress. HENT:     Head: Normocephalic and atraumatic.  Eyes:     Extraocular Movements: Extraocular movements intact.     Conjunctiva/sclera: Conjunctivae normal.  Cardiovascular:     Rate and Rhythm: Normal rate and regular rhythm.     Pulses: Normal pulses.  Pulmonary:     Effort: Pulmonary effort is normal. No respiratory distress.     Breath sounds: Normal breath sounds. No wheezing or rales.  Abdominal:     General: Bowel  sounds are normal. There is no distension.     Palpations: Abdomen is soft.     Tenderness: There is no abdominal tenderness.  Musculoskeletal:     Cervical back: Normal range of motion and neck supple.     Right lower leg: No edema.     Left lower leg: No edema.  Skin:    General: Skin is warm and dry.  Neurological:     General: No focal deficit present.     Mental Status: She is alert and oriented to person, place, and time.     Labs on Admission: I have personally reviewed following labs and imaging studies  CBC: Recent Labs  Lab 03/20/21 1219  WBC 7.3  NEUTROABS 4.5  HGB 12.9  HCT 40.8  MCV 98.1  PLT 220   Basic Metabolic Panel: Recent Labs  Lab 03/20/21 1219  NA 139  K 4.2  CL 105  CO2 27  GLUCOSE 97  BUN 14  CREATININE 0.79  CALCIUM 9.3   GFR: Estimated Creatinine Clearance: 42 mL/min (by C-G formula based on SCr of 0.79 mg/dL). Liver Function Tests: No results for input(s): AST, ALT, ALKPHOS, BILITOT, PROT, ALBUMIN in the last 168 hours. No results for input(s): LIPASE, AMYLASE in the last 168 hours. No results for input(s): AMMONIA in the last 168 hours. Coagulation Profile: No results for input(s): INR, PROTIME in the last 168 hours. Cardiac Enzymes: No results for input(s): CKTOTAL, CKMB, CKMBINDEX, TROPONINI in the last 168 hours. BNP (last 3 results) No results for input(s): PROBNP in the last 8760 hours. HbA1C: No results for input(s): HGBA1C in the last 72 hours. CBG: No results for input(s): GLUCAP in the last 168 hours. Lipid Profile: No results for input(s): CHOL, HDL, LDLCALC, TRIG, CHOLHDL, LDLDIRECT in the last 72 hours. Thyroid Function Tests: No results for input(s): TSH, T4TOTAL, FREET4, T3FREE, THYROIDAB in the last 72 hours. Anemia Panel: No results for input(s): VITAMINB12, FOLATE, FERRITIN, TIBC, IRON, RETICCTPCT in the last 72 hours. Urine analysis:    Component Value Date/Time   COLORURINE STRAW (A) 07/21/2020 0228    APPEARANCEUR CLEAR 07/21/2020 0228   LABSPEC 1.012 07/21/2020 0228   PHURINE 8.0 07/21/2020 0228   GLUCOSEU NEGATIVE 07/21/2020 0228   HGBUR NEGATIVE 07/21/2020 0228   BILIRUBINUR NEGATIVE 07/21/2020 0228   KETONESUR NEGATIVE 07/21/2020 0228   PROTEINUR NEGATIVE 07/21/2020 0228   NITRITE NEGATIVE 07/21/2020 0228   LEUKOCYTESUR NEGATIVE 07/21/2020 0228    Radiological Exams on Admission: CT ANGIO AO+BIFEM W & OR WO CONTRAST  Result Date: 03/20/2021 CLINICAL DATA:  Leg pain, arterial embolism EXAM: CT ANGIOGRAPHY OF ABDOMINAL AORTA WITH ILIOFEMORAL RUNOFF TECHNIQUE: Multidetector CT imaging of the abdomen, pelvis and lower extremities was performed using the standard protocol during bolus administration of intravenous contrast. Multiplanar CT image reconstructions and MIPs were obtained to evaluate the vascular anatomy. CONTRAST:  100mL OMNIPAQUE IOHEXOL 350 MG/ML SOLN COMPARISON:  None. FINDINGS: VASCULAR Aorta: Moderate calcified atheromatous plaque. No aneurysm, dissection, or stenosis. Celiac: Partially calcified ostial plaque resulting in short segment mild stenosis over length of less than 1 cm, patent distally with unremarkable distal branching. SMA: Calcified ostial plaque resulting in short-segment ostial stenosis less than 1 cm of at least mild severity, widely patent distally with classic distal branch anatomy. Renals: Single left, with partially calcified ostial plaque and short-segment stenosis of at least moderate severity, patent distally. Single right, with calcified ostial plaque with only mild short-segment stenosis, patent distally. IMA: Patent without evidence of aneurysm, dissection, vasculitis or significant stenosis. RIGHT Lower Extremity Inflow: Common iliac scattered calcified atheromatous plaque Internal iliac atheromatous, patent External iliac minimally atheromatous, patent Outflow: Common femoral mildly atheromatous, patent Deep femoral branches patent. SFA scattered  calcified plaque with tandem areas of mild stenosis. Popliteal calcified plaque proximally with short-segment mild stenosis, patent distally across the knee Runoff: There is poor contrast opacification of vessels below the distal calf but which may reflect early scan timing relative to the arterial contrast bolus. Anterior tibial occludes distal calf level. Posterior tibial seen to above the ankle. Peroneal is diminutive, seen to just above the ankle. LEFT Lower Extremity Inflow: Common iliac scattered calcified plaque without high-grade stenosis, aneurysm or dissection. Internal iliac occlusion just beyond its origin. External iliac  scattered plaque, no stenosis. Outflow: Common femoral mildly atheromatous, patent. Deep femoral branches patent. SFA scattered calcified plaque with mild stenosis distally Popliteal scattered calcified plaque with tandem mild stenoses above the knee, patent distally Runoff: Anterior and posterior tibial contiguous runoff across the ankle. Peroneal artery is diminutive, seen to the distal calf. Veins: No obvious venous abnormality within the limitations of this arterial phase study. Review of the MIP images confirms the above findings. NON-VASCULAR Lower chest: Trace bilateral pleural fluid. Hepatobiliary: No focal liver abnormality is seen. No gallstones, gallbladder wall thickening, or biliary dilatation. Pancreas: Unremarkable. No pancreatic ductal dilatation or surrounding inflammatory changes. Spleen: Normal in size without focal abnormality. Adrenals/Urinary Tract: Adrenal glands are unremarkable. Kidneys are normal, without renal calculi, focal lesion, or hydronephrosis. Bladder is unremarkable. Stomach/Bowel: Stomach decompressed. Small bowel nondilated, unremarkable. Appendix not discretely identified. Mild proximal colonic distension, decompressed distally with scattered sigmoid diverticula; no significant adjacent inflammatory/edematous change. Lymphatic: No abdominal or  pelvic adenopathy. Reproductive: Uterus and bilateral adnexa are unremarkable. Other: No ascites.  No free air. Musculoskeletal: Spondylitic changes in the lower lumbar spine. Multilevel bilateral knee DJD involving primarily lateral compartments. No fracture or worrisome bone lesion. IMPRESSION: 1. Aortic atherosclerosis (ICD10-170.0) with mild visceral and renal ostial stenoses. 2. Scattered bilateral lower extremity arterial inflow and outflow disease with no high-grade stenosis, embolus, or occlusion. 3. Limited assessment of distal right lower extremity tibial runoff due to scan timing. Cannot confidently exclude distal tibial occlusive or embolic disease. 4. Two-vessel left lower extremity tibial runoff. 5. Lower lumbar spondylitic change. 6. Sigmoid diverticulosis. Electronically Signed   By: Corlis Leak M.D.   On: 03/20/2021 15:48   DG Knee Complete 4 Views Left  Result Date: 03/20/2021 CLINICAL DATA:  Left knee pain EXAM: LEFT KNEE - COMPLETE 4+ VIEW COMPARISON:  None. FINDINGS: No evidence of fracture, dislocation, or joint effusion. Moderate tricompartmental osteoarthritis. Prominent vascular calcifications. IMPRESSION: 1. No acute osseous abnormality, left knee. 2. Moderate tricompartmental osteoarthritis. Electronically Signed   By: Duanne Guess D.O.   On: 03/20/2021 13:51   DG Hip Unilat W or Wo Pelvis 2-3 Views Left  Result Date: 03/20/2021 CLINICAL DATA:  Acute left hip and leg pain, no reported injury. EXAM: DG HIP (WITH OR WITHOUT PELVIS) 2-3V LEFT COMPARISON:  None. FINDINGS: Hip joint space is relatively well maintained bilaterally. Mild subchondral sclerosis along the acetabular roofs bilaterally. No acute osseous abnormality. Degenerative changes in the spine. IMPRESSION: 1. No acute findings. 2. Mild bilateral hip osteoarthritis. Electronically Signed   By: Leanna Battles M.D.   On: 03/20/2021 13:48    Assessment/Plan Principal Problem:   Hip pain Active Problems:   HTN  (hypertension)   Hypothyroidism   History of CVA (cerebrovascular accident)   Acute left hip and leg pain  X-ray of hip/pelvis showing mild bilateral hip osteoarthritis; no acute findings.  X-ray of left knee showing moderate tricompartmental osteoarthritis; no acute findings.  CTA showing scattered bilateral lower extremity arterial inflow and outflow disease with no high-grade stenosis, embolus, or occlusion. MRI of left hip (per preliminary report under PACS) showing avulsion of the left iliopsoas tendon with retraction of the tendon by 5.8 cm.  No fracture or dislocation.  Small left hip effusion.  Mild left hip degenerative arthritis. -Pain management, PT/OT.  Consult orthopedics in the morning.  Hypertension Blood pressure was elevated on arrival but now improved after pain control. -Continue home amlodipine, HCTZ, lisinopril  History of CVA -Hold Plavix until patient is seen by  orthopedics.  Continue statin.  Hypothyroidism -Continue Synthroid  DVT prophylaxis: SCDs at this time Code Status: Full code-discussed with the patient and her daughter. Family Communication: Daughter updated. Disposition Plan: Status is: Observation  The patient remains OBS appropriate and will d/c before 2 midnights.  Dispo: The patient is from: Home              Anticipated d/c is to: Home              Patient currently is not medically stable to d/c.   Difficult to place patient No  Level of care: Level of care: Med-Surg   The medical decision making on this patient was of high complexity and the patient is at high risk for clinical deterioration, therefore this is a level 3 visit.  John Giovanni MD [facility]  If 7PM-7AM, please contact night-coverage [email or web address]  03/21/2021, 2:24 AM

## 2021-03-21 NOTE — Progress Notes (Signed)
PROGRESS NOTE        PATIENT DETAILS Name: Nicole [name] Age: 87 y.o. Sex: female Date of Birth: [birth year] Admit Date: 03/20/2021 Admitting Physician John Giovanni, MD IRC:VELFYBO, Gerlene Burdock, MD  Brief Narrative: Patient is a 87 y.o. female history of CVA with residual left-sided weakness, HTN, hypothyroidism-who presented with severe left hip pain-further evaluation left iliopsoas tear/avulsion.  Significant events: 5/16>> admit for evaluation of left hip pain-found to have left iliopsoas tear.  No fractures.   Significant studies: 5/16 >>CT angio aorta/bifemoral: No high-grade stenosis/embolus/occlusion, sigmoid diverticulosis. 5/16>> x-ray left hip: No acute findings. 5/16>> x-ray left knee: No acute abnormalities. 5/16>> MRI left hip: Acute full-thickness tear of distal iliopsoas tendon.  No evidence of fracture  Antimicrobial therapy: None  Microbiology data: 5/17>> influenza/COVID PCR: Negative  Procedures : None  Consults: Orthopedics  DVT Prophylaxis : enoxaparin (LOVENOX) injection 40 mg Start: 03/21/21 1145 SCDs Start: 03/21/21 0218   Subjective: Continues to have severe left hip pain with minimal movement.   Assessment/Plan: Acute full-thickness tear of the distal left iliopsoas tendon: Evaluated by orthopedics-recommendations are for nonoperative management, WBAT, limit hip flexion to 90 degrees.  Continues to have severe pain-with even minimal movement while lying in bed-requiring numerous doses of IV Dilaudid overnight.  Await PT OT eval-start scheduled Tylenol-attempt to transition to oral narcotics-May require short-term SNF rehab.  HTN: BP stable-continue amlodipine/HCTZ and lisinopril.  Hypothyroidism: Continue Synthroid  History of CVA with mild left-sided weakness: Continue Plavix/statin.  Diet: Diet Order            Diet Heart Room service appropriate? Yes; Fluid consistency: Thin  Diet effective now                   Code Status: Full code   Family Communication: Daughter at bedside   Disposition Plan: Status is: Observation  The patient will require care spanning > 2 midnights and should be moved to inpatient because: Inpatient level of care appropriate due to severity of illness  Dispo: The patient is from: Home              Anticipated d/c is to: SNF              Patient currently is not medically stable to d/c.   Difficult to place patient No    Barriers to Discharge: Severe left hip pain-requiring IV narcotics-see above.  Antimicrobial agents: Anti-infectives (From admission, onward)   None       Time spent: 35 minutes-Greater than 50% of this time was spent in counseling, explanation of diagnosis, planning of further management, and coordination of care.  MEDICATIONS: Scheduled Meds: . amLODipine  10 mg Oral Daily  . atorvastatin  80 mg Oral Daily  . enoxaparin (LOVENOX) injection  40 mg Subcutaneous Q24H  . hydrochlorothiazide  25 mg Oral Daily  . levothyroxine  50 mcg Oral Q0600  . lisinopril  20 mg Oral Daily  . loratadine  10 mg Oral Daily  . oxybutynin  5 mg Oral Daily   Continuous Infusions: PRN Meds:.albuterol, baclofen, HYDROmorphone (DILAUDID) injection   PHYSICAL EXAM: Vital signs: Vitals:   03/21/21 0056 03/21/21 0520 03/21/21 0742 03/21/21 0756  BP: (!) 164/89 (!) 161/73  (!) 142/69  Pulse: 81 70  72  Resp: 16 16  16   Temp: 98.1 F (36.7 C)  (!) 97.4  F (36.3 C) (!) 97.5 F (36.4 C)  TempSrc: Oral  Oral Oral  SpO2: 94% 95%  98%  Weight:      Height:       Filed Weights   03/20/21 1117  Weight: 61.2 kg   Body mass index is 23.17 kg/m.   Gen Exam:Alert awake-not in any distress HEENT:atraumatic, normocephalic Chest: B/L clear to auscultation anteriorly CVS:S1S2 regular Abdomen:soft non tender, non distended Extremities:no edema Neurology: Left sided weakness-mild/chronic-exam limited by pain Skin: no rash  I have personally  reviewed following labs and imaging studies  LABORATORY DATA: CBC: Recent Labs  Lab 03/20/21 1219  WBC 7.3  NEUTROABS 4.5  HGB 12.9  HCT 40.8  MCV 98.1  PLT 220    Basic Metabolic Panel: Recent Labs  Lab 03/20/21 1219  NA 139  K 4.2  CL 105  CO2 27  GLUCOSE 97  BUN 14  CREATININE 0.79  CALCIUM 9.3    GFR: Estimated Creatinine Clearance: 42 mL/min (by C-G formula based on SCr of 0.79 mg/dL).  Liver Function Tests: No results for input(s): AST, ALT, ALKPHOS, BILITOT, PROT, ALBUMIN in the last 168 hours. No results for input(s): LIPASE, AMYLASE in the last 168 hours. No results for input(s): AMMONIA in the last 168 hours.  Coagulation Profile: No results for input(s): INR, PROTIME in the last 168 hours.  Cardiac Enzymes: No results for input(s): CKTOTAL, CKMB, CKMBINDEX, TROPONINI in the last 168 hours.  BNP (last 3 results) No results for input(s): PROBNP in the last 8760 hours.  Lipid Profile: No results for input(s): CHOL, HDL, LDLCALC, TRIG, CHOLHDL, LDLDIRECT in the last 72 hours.  Thyroid Function Tests: No results for input(s): TSH, T4TOTAL, FREET4, T3FREE, THYROIDAB in the last 72 hours.  Anemia Panel: No results for input(s): VITAMINB12, FOLATE, FERRITIN, TIBC, IRON, RETICCTPCT in the last 72 hours.  Urine analysis:    Component Value Date/Time   COLORURINE STRAW (A) 07/21/2020 0228   APPEARANCEUR CLEAR 07/21/2020 0228   LABSPEC 1.012 07/21/2020 0228   PHURINE 8.0 07/21/2020 0228   GLUCOSEU NEGATIVE 07/21/2020 0228   HGBUR NEGATIVE 07/21/2020 0228   BILIRUBINUR NEGATIVE 07/21/2020 0228   KETONESUR NEGATIVE 07/21/2020 0228   PROTEINUR NEGATIVE 07/21/2020 0228   NITRITE NEGATIVE 07/21/2020 0228   LEUKOCYTESUR NEGATIVE 07/21/2020 0228    Sepsis Labs: Lactic Acid, Venous No results found for: LATICACIDVEN  MICROBIOLOGY: Recent Results (from the past 240 hour(s))  Resp Panel by RT-PCR (Flu A&B, Covid) Nasopharyngeal Swab     Status: None    Collection Time: 03/21/21 12:57 AM   Specimen: Nasopharyngeal Swab; Nasopharyngeal(NP) swabs in vial transport medium  Result Value Ref Range Status   SARS Coronavirus 2 by RT PCR NEGATIVE NEGATIVE Final    Comment: (NOTE) SARS-CoV-2 target nucleic acids are NOT DETECTED.  The SARS-CoV-2 RNA is generally detectable in upper respiratory specimens during the acute phase of infection. The lowest concentration of SARS-CoV-2 viral copies this assay can detect is 138 copies/mL. A negative result does not preclude SARS-Cov-2 infection and should not be used as the sole basis for treatment or other patient management decisions. A negative result may occur with  improper specimen collection/handling, submission of specimen other than nasopharyngeal swab, presence of viral mutation(s) within the areas targeted by this assay, and inadequate number of viral copies(<138 copies/mL). A negative result must be combined with clinical observations, patient history, and epidemiological information. The expected result is Negative.  Fact Sheet for Patients:  BloggerCourse.com  Fact Sheet  for Healthcare Providers:  SeriousBroker.ithttps://www.fda.gov/media/152162/download  This test is no t yet approved or cleared by the [place] FDA and  has been authorized for detection and/or diagnosis of SARS-CoV-2 by FDA under an Emergency Use Authorization (EUA). This EUA will remain  in effect (meaning this test can be used) for the duration of the COVID-19 declaration under Section 564(b)(1) of the Act, 21 U.S.C.section 360bbb-3(b)(1), unless the authorization is terminated  or revoked sooner.       Influenza A by PCR NEGATIVE NEGATIVE Final   Influenza B by PCR NEGATIVE NEGATIVE Final    Comment: (NOTE) The Xpert Xpress SARS-CoV-2/FLU/RSV plus assay is intended as an aid in the diagnosis of influenza from Nasopharyngeal swab specimens and should not be used as a sole basis for treatment.  Nasal washings and aspirates are unacceptable for Xpert Xpress SARS-CoV-2/FLU/RSV testing.  Fact Sheet for Patients: BloggerCourse.comhttps://www.fda.gov/media/152166/download  Fact Sheet for Healthcare Providers: SeriousBroker.ithttps://www.fda.gov/media/152162/download  This test is not yet approved or cleared by the [place] FDA and has been authorized for detection and/or diagnosis of SARS-CoV-2 by FDA under an Emergency Use Authorization (EUA). This EUA will remain in effect (meaning this test can be used) for the duration of the COVID-19 declaration under Section 564(b)(1) of the Act, 21 U.S.C. section 360bbb-3(b)(1), unless the authorization is terminated or revoked.  Performed at [facility], [address] [place], [place] [address]     RADIOLOGY STUDIES/RESULTS: CT ANGIO AO+BIFEM W & OR WO CONTRAST  Result Date: 03/20/2021 CLINICAL DATA:  Leg pain, arterial embolism EXAM: CT ANGIOGRAPHY OF ABDOMINAL AORTA WITH ILIOFEMORAL RUNOFF TECHNIQUE: Multidetector CT imaging of the abdomen, pelvis and lower extremities was performed using the standard protocol during bolus administration of intravenous contrast. Multiplanar CT image reconstructions and MIPs were obtained to evaluate the vascular anatomy. CONTRAST:  100mL OMNIPAQUE IOHEXOL 350 MG/ML SOLN COMPARISON:  None. FINDINGS: VASCULAR Aorta: Moderate calcified atheromatous plaque. No aneurysm, dissection, or stenosis. Celiac: Partially calcified ostial plaque resulting in short segment mild stenosis over length of less than 1 cm, patent distally with unremarkable distal branching. SMA: Calcified ostial plaque resulting in short-segment ostial stenosis less than 1 cm of at least mild severity, widely patent distally with classic distal branch anatomy. Renals: Single left, with partially calcified ostial plaque and short-segment stenosis of at least moderate severity, patent distally. Single right, with calcified ostial plaque with only mild short-segment  stenosis, patent distally. IMA: Patent without evidence of aneurysm, dissection, vasculitis or significant stenosis. RIGHT Lower Extremity Inflow: Common iliac scattered calcified atheromatous plaque Internal iliac atheromatous, patent External iliac minimally atheromatous, patent Outflow: Common femoral mildly atheromatous, patent Deep femoral branches patent. SFA scattered calcified plaque with tandem areas of mild stenosis. Popliteal calcified plaque proximally with short-segment mild stenosis, patent distally across the knee Runoff: There is poor contrast opacification of vessels below the distal calf but which may reflect early scan timing relative to the arterial contrast bolus. Anterior tibial occludes distal calf level. Posterior tibial seen to above the ankle. Peroneal is diminutive, seen to just above the ankle. LEFT Lower Extremity Inflow: Common iliac scattered calcified plaque without high-grade stenosis, aneurysm or dissection. Internal iliac occlusion just beyond its origin. External iliac scattered plaque, no stenosis. Outflow: Common femoral mildly atheromatous, patent. Deep femoral branches patent. SFA scattered calcified plaque with mild stenosis distally Popliteal scattered calcified plaque with tandem mild stenoses above the knee, patent distally Runoff: Anterior and posterior tibial contiguous runoff across the ankle. Peroneal artery is diminutive,  seen to the distal calf. Veins: No obvious venous abnormality within the limitations of this arterial phase study. Review of the MIP images confirms the above findings. NON-VASCULAR Lower chest: Trace bilateral pleural fluid. Hepatobiliary: No focal liver abnormality is seen. No gallstones, gallbladder wall thickening, or biliary dilatation. Pancreas: Unremarkable. No pancreatic ductal dilatation or surrounding inflammatory changes. Spleen: Normal in size without focal abnormality. Adrenals/Urinary Tract: Adrenal glands are unremarkable. Kidneys are  normal, without renal calculi, focal lesion, or hydronephrosis. Bladder is unremarkable. Stomach/Bowel: Stomach decompressed. Small bowel nondilated, unremarkable. Appendix not discretely identified. Mild proximal colonic distension, decompressed distally with scattered sigmoid diverticula; no significant adjacent inflammatory/edematous change. Lymphatic: No abdominal or pelvic adenopathy. Reproductive: Uterus and bilateral adnexa are unremarkable. Other: No ascites.  No free air. Musculoskeletal: Spondylitic changes in the lower lumbar spine. Multilevel bilateral knee DJD involving primarily lateral compartments. No fracture or worrisome bone lesion. IMPRESSION: 1. Aortic atherosclerosis (ICD10-170.0) with mild visceral and renal ostial stenoses. 2. Scattered bilateral lower extremity arterial inflow and outflow disease with no high-grade stenosis, embolus, or occlusion. 3. Limited assessment of distal right lower extremity tibial runoff due to scan timing. Cannot confidently exclude distal tibial occlusive or embolic disease. 4. Two-vessel left lower extremity tibial runoff. 5. Lower lumbar spondylitic change. 6. Sigmoid diverticulosis. Electronically Signed   By: Corlis Leak M.D.   On: 03/20/2021 15:48   MR HIP LEFT WO CONTRAST  Result Date: 03/21/2021 CLINICAL DATA:  Hip pain, stress fracture suspected, neg xray EXAM: MR OF THE LEFT HIP WITHOUT CONTRAST TECHNIQUE: Multiplanar, multisequence MR imaging was performed. No intravenous contrast was administered. COMPARISON:  Radiograph 03/20/2021 FINDINGS: Bones: There is mild disruption of the lesser trochanter cortex from tendon avulsion. There is no displaced fracture. There is no focal bone lesion. There is subchondral bony edema in the left inferior SI joint, this is on both sides of the joint in the sacrum and iliac bone and likely degenerative. Lower lumbar spine degenerative change. Articular cartilage and labrum Articular cartilage:  Mild to moderate  chondrosis. Labrum: Degenerative superior labral tearing. Joint or bursal effusion Joint effusion: Small hip effusion. Bursae: There is fluid within the iliopsoas bursa, reactive from tendon tear. No evidence of trochanteric bursitis. Muscles and tendons Muscles and tendons: There is avulsion of the iliopsoas tendon from its insertion on the lesser trochanter, with differentially retracted fibers, overall retraction measuring approximately 5.5 cm. There is intramuscular edema in the left gluteal muscles, likely reactive. The distal gluteal tendons appear intact. Degenerative signal at the proximal hamstrings tendons likely normal for age. Reactive muscle edema in the adductors of the left hip. Other findings Miscellaneous: Trace free fluid in the pelvis. Otherwise, the visualized structures are unremarkable. IMPRESSION: Acute full-thickness tear/avulsion of the distal iliopsoas tendon from its insertion on the lesser trochanter of the left femur. Mild disruption of the lesser trochanter bony cortex, with preservation of the majority of bony architecture. No evidence of underlying bony lesion in the left femur. Reactive joint effusion, iliopsoas bursitis, edema in the adductors, and edema in the gluteal musculature. Mild to moderate left hip osteoarthritis with degenerative superior labral tearing. Lower lumbar spine and left sacroiliac joint degenerative change. Electronically Signed   By: Caprice Renshaw   On: 03/21/2021 08:15   DG Knee Complete 4 Views Left  Result Date: 03/20/2021 CLINICAL DATA:  Left knee pain EXAM: LEFT KNEE - COMPLETE 4+ VIEW COMPARISON:  None. FINDINGS: No evidence of fracture, dislocation, or joint effusion. Moderate tricompartmental osteoarthritis. Prominent vascular  calcifications. IMPRESSION: 1. No acute osseous abnormality, left knee. 2. Moderate tricompartmental osteoarthritis. Electronically Signed   By: Duanne Guess D.O.   On: 03/20/2021 13:51   DG Hip Unilat W or Wo Pelvis 2-3  Views Left  Result Date: 03/20/2021 CLINICAL DATA:  Acute left hip and leg pain, no reported injury. EXAM: DG HIP (WITH OR WITHOUT PELVIS) 2-3V LEFT COMPARISON:  None. FINDINGS: Hip joint space is relatively well maintained bilaterally. Mild subchondral sclerosis along the acetabular roofs bilaterally. No acute osseous abnormality. Degenerative changes in the spine. IMPRESSION: 1. No acute findings. 2. Mild bilateral hip osteoarthritis. Electronically Signed   By: Leanna Battles M.D.   On: 03/20/2021 13:48     LOS: 0 days   Jeoffrey Massed, MD  [facility]    To contact the attending provider between 7A-7P or the covering provider during after hours 7P-7A, please log into the web site [email or web address] and access using universal [place] password for that web site. If you do not have the password, please call the hospital operator.  03/21/2021, 10:49 AM

## 2021-03-21 NOTE — ED Notes (Signed)
Placed Breakfast order 

## 2021-03-21 NOTE — Evaluation (Signed)
Physical Therapy Evaluation Patient Details Name: Nicole [name] MRN: [id number] DOB: 1930-11-02 Today's Date: 03/21/2021   History of Present Illness  Patient is a 87 y.o. female-who presented with severe left hip pain-further evaluation left iliopsoas tear/avulsion. She lives upstairs and gets down the stairs by sitting down and bumping down on her own. She denies any antecedent event. History of CVA with residual left-sided weakness, HTN, hypothyroidism  Clinical Impression  Pt admitted secondary to problem above with deficits below. Pt very limited secondary to pain this session. Required mod A to sit at EOB. Pt with increased pain and unable to tolerate further mobility. Educated about hip precautions that ortho doc requested to maintain. Feel pt would benefit from SNF level therapies at d/c given current mobility limitations. Will continue to follow acutely.     Follow Up Recommendations SNF;Supervision for mobility/OOB    Equipment Recommendations  None recommended by PT    Recommendations for Other Services       Precautions / Restrictions Precautions Precautions: Fall;Other (comment) Precaution Comments: No L hip flexion past 90 degrees Restrictions Weight Bearing Restrictions: Yes LLE Weight Bearing: Weight bearing as tolerated Other Position/Activity Restrictions: No L hip flexion past 90 degrees      Mobility  Bed Mobility Overal bed mobility: Needs Assistance Bed Mobility: Rolling;Sidelying to Sit;Sit to Sidelying Rolling: Mod assist Sidelying to sit: Mod assist     Sit to sidelying: Mod assist General bed mobility comments: Required assist for rolling, LE and trunk assist to come to sitting. Cues to maintain precautions. Upon sitting, pt with increased pain and unable to tolerate further mobility.    Transfers                    Ambulation/Gait                Stairs            Wheelchair Mobility    Modified Rankin (Stroke Patients  Only)       Balance Overall balance assessment: Needs assistance Sitting-balance support: Bilateral upper extremity supported;Feet supported Sitting balance-Leahy Scale: Poor Sitting balance - Comments: Reliant on BUE support                                     Pertinent Vitals/Pain Pain Assessment: 0-10 Pain Score: 7  Pain Location: L hip Pain Descriptors / Indicators: Aching;Grimacing Pain Intervention(s): Limited activity within patient's tolerance;Monitored during session;Repositioned    Home Living Family/patient expects to be discharged to:: Private residence Living Arrangements: Children Available Help at Discharge: Family;Available PRN/intermittently Type of Home: House Home Access: Level entry     Home Layout: Two level Home Equipment: Bedside commode;Wheelchair - [employer] - 2 wheels Additional Comments: Has an aide 2-3X a week for a couple of hours.    Prior Function Level of Independence: Needs assistance   Gait / Transfers Assistance Needed: Uses WC mostly, but does ambulate some with RW.  ADL's / Homemaking Assistance Needed: Aide assists with bathing and dressing.        Hand Dominance        Extremity/Trunk Assessment   Upper Extremity Assessment Upper Extremity Assessment: Defer to OT evaluation    Lower Extremity Assessment Lower Extremity Assessment: LLE deficits/detail LLE Deficits / Details: limited ROM secondary to hip pain.    Cervical / Trunk Assessment Cervical / Trunk Assessment: Normal  Communication   Communication:  HOH  Cognition Arousal/Alertness: Awake/alert Behavior During Therapy: WFL for tasks assessed/performed Overall Cognitive Status: No family/caregiver present to determine baseline cognitive functioning                                        General Comments      Exercises     Assessment/Plan    PT Assessment Patient needs continued PT services  PT Problem List Decreased  strength;Decreased range of motion;Decreased activity tolerance;Decreased balance;Decreased mobility;Decreased knowledge of use of DME;Decreased knowledge of precautions;Pain       PT Treatment Interventions DME instruction;Gait training;Functional mobility training;Therapeutic exercise;Therapeutic activities;Balance training;Patient/family education    PT Goals (Current goals can be found in the Care Plan section)  Acute Rehab PT Goals Patient Stated Goal: to decrease pain PT Goal Formulation: With patient Time For Goal Achievement: 04/04/21 Potential to Achieve Goals: Good    Frequency Min 3X/week   Barriers to discharge        Co-evaluation               AM-PAC PT "6 Clicks" Mobility  Outcome Measure Help needed turning from your back to your side while in a flat bed without using bedrails?: A Lot Help needed moving from lying on your back to sitting on the side of a flat bed without using bedrails?: A Lot Help needed moving to and from a bed to a chair (including a wheelchair)?: A Lot Help needed standing up from a chair using your arms (e.g., wheelchair or bedside chair)?: A Lot Help needed to walk in hospital room?: Total Help needed climbing 3-5 steps with a railing? : Total 6 Click Score: 10    End of Session   Activity Tolerance: Patient limited by pain Patient left: in bed;with call bell/phone within reach;with bed alarm set Nurse Communication: Mobility status PT Visit Diagnosis: Unsteadiness on feet (R26.81);Muscle weakness (generalized) (M62.81);Difficulty in walking, not elsewhere classified (R26.2);Pain Pain - Right/Left: Left Pain - part of body: Hip    Time: 8527-7824 PT Time Calculation (min) (ACUTE ONLY): 15 min   Charges:   PT Evaluation $PT Eval Moderate Complexity: 1 Mod          Farley Ly, PT, DPT  Acute Rehabilitation Services  Pager: [phone] Office: [phone]   Lehman Prom 03/21/2021, 3:49 PM

## 2021-03-21 NOTE — Progress Notes (Signed)
Occupational Therapy Evaluation Patient Details Name: Nicole [name] MRN: [id number] DOB: 1929/12/13 Today's Date: 03/21/2021    History of Present Illness Patient is a 87 y.o. female-who presented with severe left hip pain-further evaluation left iliopsoas tear/avulsion. Nonoperative management, WBAT, limit hip flexion to 90 degrees. She lives upstairs and gets down the stairs by sitting down and bumping down on her own. She denies any antecedent event. History of CVA with residual left-sided weakness, HTN, hypothyroidism   Clinical Impression   Nicole [name] was evaluated s/p the above L hip pain, she is pleasant and willing to participate in therapy. Pt reports having an aide who comes about 3 times a week to assist with ADLs, she also reports that she drives and does her own grocery shopping since the recent CVA. No family present to confirm. Pt lives with her daughter who works during the day, pt's bedroom is upstairs and she had help getting up the stairs and would bump down on her bottom to descend the stairs. Pt was light mod A for bed mobility this session with 8/10 pain with change in position. Deferred OOB transfer until pain is better managed. Nicole [name] has residual L sided weakness from recent CVA, grossly 4/5 strength and shoulder ROM is limited. Pt would benefit from continued OT services acutely to progress safety and function in ADLs and mobility/ Recommend d/c to SNF to continue to progress function through rehab.     Follow Up Recommendations  SNF;Supervision/Assistance - 24 hour    Equipment Recommendations  Tub/shower bench       Precautions / Restrictions Precautions Precautions: Fall Precaution Comments: WBAT, limit hip flexion to 90 degrees. Restrictions Weight Bearing Restrictions: Yes LLE Weight Bearing: Weight bearing as tolerated Other Position/Activity Restrictions: LLE WBAT      Mobility Bed Mobility Overal bed mobility: Needs Assistance Bed Mobility: Rolling;Sidelying to  Sit;Sit to Supine Rolling: Min assist Sidelying to sit: Mod assist   Sit to supine: Mod assist Sit to sidelying: Mod assist General bed mobility comments: pt with incrased pain for all bed mobility    Transfers Overall transfer level: Needs assistance               General transfer comment: deferred this session due to pain    Balance Overall balance assessment: Needs assistance Sitting-balance support: No upper extremity supported;Feet supported Sitting balance-Leahy Scale: Fair Sitting balance - Comments: Reliant on BUE support           ADL either performed or assessed with clinical judgement   ADL Overall ADL's : Needs assistance/impaired Eating/Feeding: Set up;Sitting   Grooming: Set up;Sitting   Upper Body Bathing: Set up;Sitting   Lower Body Bathing: Maximal assistance;Bed level   Upper Body Dressing : Set up;Sitting   Lower Body Dressing: Maximal assistance;Bed level   Toilet Transfer: Maximal assistance;+2 for physical assistance;BSC   Toileting- Clothing Manipulation and Hygiene: Maximal assistance;Bed level       Functional mobility during ADLs: Maximal assistance;+2 for physical assistance General ADL Comments: pt able to complete ADLs while sitting EOB, unable to complete OOB functional activity this session due to pain     Vision Baseline Vision/History: Wears glasses Wears Glasses: Reading only        Pertinent Vitals/Pain Pain Assessment: Faces Pain Score: 8  Pain Location: L hip Pain Descriptors / Indicators: Aching;Grimacing Pain Intervention(s): Limited activity within patient's tolerance;Monitored during session;Premedicated before session     Hand Dominance Right   Extremity/Trunk Assessment Upper Extremity Assessment Upper Extremity  Assessment: Generalized weakness;LUE deficits/detail LUE Deficits / Details: Residual hemiparesis in LUE from past CAV, AROM of hadn, wrist and elbow are WFL, L shoulder is limited to 90*  flexion; grossly 4/5 LUE Sensation: WNL LUE Coordination: decreased fine motor   Lower Extremity Assessment Lower Extremity Assessment: Defer to PT evaluation LLE Deficits / Details: limited ROM secondary to hip pain.   Cervical / Trunk Assessment Cervical / Trunk Assessment: Kyphotic   Communication Communication Communication: HOH   Cognition Arousal/Alertness: Awake/alert Behavior During Therapy: WFL for tasks assessed/performed Overall Cognitive Status: No family/caregiver present to determine baseline cognitive functioning           General Comments  pt with no apparent skin integrity issues noted, reported 8/10 pain wtih movement            Home Living Family/patient expects to be discharged to:: Private residence Living Arrangements: Children Available Help at Discharge: Family;Available PRN/intermittently Type of Home: House Home Access: Level entry     Home Layout: Two level Alternate Level Stairs-Number of Steps: flight Alternate Level Stairs-Rails: Left Bathroom Shower/Tub: Chief Strategy Officer: Standard     Home Equipment: Bedside commode;Wheelchair - [employer] - 2 wheels   Additional Comments: Has an aide 2-3X a week for a couple of hours.      Prior Functioning/Environment Level of Independence: Needs assistance  Gait / Transfers Assistance Needed: Uses WC mostly, but does ambulate some with RW. ADL's / Homemaking Assistance Needed: Aide assists with bathing and dressing.   Comments: Pt reports that she was bathing at the sink indep, her aide was assisting with full showers in the tub/shower unit, she reports that she has been driving and going to the grocery store since the CVA        OT Problem List: Decreased strength;Decreased range of motion;Decreased activity tolerance;Impaired balance (sitting and/or standing);Decreased safety awareness;Decreased knowledge of use of DME or AE;Decreased knowledge of precautions;Pain       OT Treatment/Interventions: Self-care/ADL training;Therapeutic exercise;Balance training;Patient/family education;Therapeutic activities    OT Goals(Current goals can be found in the care plan section) Acute Rehab OT Goals Patient Stated Goal: to walk without pain OT Goal Formulation: With patient Time For Goal Achievement: 04/04/21 Potential to Achieve Goals: Fair ADL Goals Pt Will Perform Lower Body Bathing: with set-up;sit to/from stand Pt Will Perform Lower Body Dressing: with min guard assist;sit to/from stand Pt Will Transfer to Toilet: with min guard assist;ambulating Additional ADL Goal #1: Pt will recall at least 3 fall prevention strategies to ensure safe transition into the next venue  OT Frequency: Min 2X/week    AM-PAC OT "6 Clicks" Daily Activity     Outcome Measure Help from another person eating meals?: A Little Help from another person taking care of personal grooming?: A Little Help from another person toileting, which includes using toliet, bedpan, or urinal?: A Lot Help from another person bathing (including washing, rinsing, drying)?: A Lot Help from another person to put on and taking off regular upper body clothing?: A Little Help from another person to put on and taking off regular lower body clothing?: A Lot 6 Click Score: 15   End of Session Equipment Utilized During Treatment: Gait belt Nurse Communication: Mobility status  Activity Tolerance: Patient limited by pain Patient left: in bed;with bed alarm set;with call bell/phone within reach  OT Visit Diagnosis: Unsteadiness on feet (R26.81);Muscle weakness (generalized) (M62.81);Pain Pain - Right/Left: Left Pain - part of body: Hip  Time: 1516-1530 OT Time Calculation (min): 14 min Charges:  OT General Charges $OT Visit: 1 Visit OT Evaluation $OT Eval Moderate Complexity: 1 Mod   Nicole [name] 03/21/2021, 3:55 PM

## 2021-03-21 NOTE — Consult Note (Signed)
Reason for Consult:Iliopsoas tear Referring Physician: Windell NorfolkS Ghimire Time called: [phone] Time at bedside: 0911   William HamburgerMary J [name] is an 87 y.o. female.  HPI: Nicole DandyMary began to have left hip and knee pain on Sunday night. She denies any antecedent event. She was brought to the ED where workup eventually revealed a left iliopsoas complete tear at its insertion on the lesser troch and orthopedic surgery was consulted. She lives at home with family. She is s/p CVA which has left her with a left hemiparesis. She lives upstairs and gets down the stairs by sitting down and bumping down on her own.  Past Medical History:  Diagnosis Date  . Hypertension   . Stroke [facility](HCC)    September 2021  . Thyroid disease     History reviewed. No pertinent surgical history.  Family History  Problem Relation Age of Onset  . Diabetes Mother   . Stroke Mother   . Diabetes Other   . Hypertension Other     Social History:  reports that she has never smoked. She has never used smokeless tobacco. She reports that she does not drink alcohol and does not use drugs.  Allergies: No Known Allergies  Medications: I have reviewed the patient's current medications.  Results for orders placed or performed during the hospital encounter of 03/20/21 (from the past 48 hour(s))  Basic metabolic panel     Status: None   Collection Time: 03/20/21 12:19 PM  Result Value Ref Range   Sodium 139 135 - 145 mmol/L   Potassium 4.2 3.5 - 5.1 mmol/L    Comment: SLIGHT HEMOLYSIS   Chloride 105 98 - 111 mmol/L   CO2 27 22 - 32 mmol/L   Glucose, Bld 97 70 - 99 mg/dL    Comment: Glucose reference range applies only to samples taken after fasting for at least 8 hours.   BUN 14 8 - 23 mg/dL   Creatinine, Ser 1.190.79 0.44 - 1.00 mg/dL   Calcium 9.3 8.9 - 14.710.3 mg/dL   GFR, Estimated >82>60 >95>60 mL/min    Comment: (NOTE) Calculated using the CKD-EPI Creatinine Equation (2021)    Anion gap 7 5 - 15    Comment: Performed at [facility],  [address] [place], [place] [address]  CBC with Differential     Status: None   Collection Time: 03/20/21 12:19 PM  Result Value Ref Range   WBC 7.3 4.0 - 10.5 K/uL   RBC 4.16 3.87 - 5.11 MIL/uL   Hemoglobin 12.9 12.0 - 15.0 g/dL   HCT 86.540.8 78.436.0 - 69.646.0 %   MCV 98.1 80.0 - 100.0 fL   MCH 31.0 26.0 - 34.0 pg   MCHC 31.6 30.0 - 36.0 g/dL   RDW 29.512.6 28.411.5 - 13.215.5 %   Platelets 220 150 - 400 K/uL   nRBC 0.0 0.0 - 0.2 %   Neutrophils Relative % 62 %   Neutro Abs 4.5 1.7 - 7.7 K/uL   Lymphocytes Relative 30 %   Lymphs Abs 2.2 0.7 - 4.0 K/uL   Monocytes Relative 6 %   Monocytes Absolute 0.5 0.1 - 1.0 K/uL   Eosinophils Relative 1 %   Eosinophils Absolute 0.1 0.0 - 0.5 K/uL   Basophils Relative 1 %   Basophils Absolute 0.1 0.0 - 0.1 K/uL   Immature Granulocytes 0 %   Abs Immature Granulocytes 0.02 0.00 - 0.07 K/uL    Comment: Performed at [facility], [address] [place],  [place] [address]  Resp Panel by RT-PCR (Flu A&B, Covid) Nasopharyngeal Swab     Status: None   Collection Time: 03/21/21 12:57 AM   Specimen: Nasopharyngeal Swab; Nasopharyngeal(NP) swabs in vial transport medium  Result Value Ref Range   SARS Coronavirus 2 by RT PCR NEGATIVE NEGATIVE    Comment: (NOTE) SARS-CoV-2 target nucleic acids are NOT DETECTED.  The SARS-CoV-2 RNA is generally detectable in upper respiratory specimens during the acute phase of infection. The lowest concentration of SARS-CoV-2 viral copies this assay can detect is 138 copies/mL. A negative result does not preclude SARS-Cov-2 infection and should not be used as the sole basis for treatment or other patient management decisions. A negative result may occur with  improper specimen collection/handling, submission of specimen other than nasopharyngeal swab, presence of viral mutation(s) within the areas targeted by this assay, and inadequate number of viral copies(<138 copies/mL). A negative result must be combined with clinical  observations, patient history, and epidemiological information. The expected result is Negative.  Fact Sheet for Patients:  BloggerCourse.com  Fact Sheet for Healthcare Providers:  SeriousBroker.it  This test is no t yet approved or cleared by the [place] FDA and  has been authorized for detection and/or diagnosis of SARS-CoV-2 by FDA under an Emergency Use Authorization (EUA). This EUA will remain  in effect (meaning this test can be used) for the duration of the COVID-19 declaration under Section 564(b)(1) of the Act, 21 U.S.C.section 360bbb-3(b)(1), unless the authorization is terminated  or revoked sooner.       Influenza A by PCR NEGATIVE NEGATIVE   Influenza B by PCR NEGATIVE NEGATIVE    Comment: (NOTE) The Xpert Xpress SARS-CoV-2/FLU/RSV plus assay is intended as an aid in the diagnosis of influenza from Nasopharyngeal swab specimens and should not be used as a sole basis for treatment. Nasal washings and aspirates are unacceptable for Xpert Xpress SARS-CoV-2/FLU/RSV testing.  Fact Sheet for Patients: BloggerCourse.com  Fact Sheet for Healthcare Providers: SeriousBroker.it  This test is not yet approved or cleared by the [place] FDA and has been authorized for detection and/or diagnosis of SARS-CoV-2 by FDA under an Emergency Use Authorization (EUA). This EUA will remain in effect (meaning this test can be used) for the duration of the COVID-19 declaration under Section 564(b)(1) of the Act, 21 U.S.C. section 360bbb-3(b)(1), unless the authorization is terminated or revoked.  Performed at [facility], [address] [place], [place] [address]     CT ANGIO AO+BIFEM W & OR WO CONTRAST  Result Date: 03/20/2021 CLINICAL DATA:  Leg pain, arterial embolism EXAM: CT ANGIOGRAPHY OF ABDOMINAL AORTA WITH ILIOFEMORAL RUNOFF TECHNIQUE: Multidetector CT  imaging of the abdomen, pelvis and lower extremities was performed using the standard protocol during bolus administration of intravenous contrast. Multiplanar CT image reconstructions and MIPs were obtained to evaluate the vascular anatomy. CONTRAST:  OMNIPAQUE IOHEXOL 350 MG/ML SOLN COMPARISON:  None. FINDINGS: VASCULAR Aorta: Moderate calcified atheromatous plaque. No aneurysm, dissection, or stenosis. Celiac: Partially calcified ostial plaque resulting in short segment mild stenosis over length of less than 1 cm, patent distally with unremarkable distal branching. SMA: Calcified ostial plaque resulting in short-segment ostial stenosis less than 1 cm of at least mild severity, widely patent distally with classic distal branch anatomy. Renals: Single left, with partially calcified ostial plaque and short-segment stenosis of at least moderate severity, patent distally. Single right, with calcified ostial plaque with only mild short-segment stenosis, patent distally. IMA: Patent without evidence of  aneurysm, dissection, vasculitis or significant stenosis. RIGHT Lower Extremity Inflow: Common iliac scattered calcified atheromatous plaque Internal iliac atheromatous, patent External iliac minimally atheromatous, patent Outflow: Common femoral mildly atheromatous, patent Deep femoral branches patent. SFA scattered calcified plaque with tandem areas of mild stenosis. Popliteal calcified plaque proximally with short-segment mild stenosis, patent distally across the knee Runoff: There is poor contrast opacification of vessels below the distal calf but which may reflect early scan timing relative to the arterial contrast bolus. Anterior tibial occludes distal calf level. Posterior tibial seen to above the ankle. Peroneal is diminutive, seen to just above the ankle. LEFT Lower Extremity Inflow: Common iliac scattered calcified plaque without high-grade stenosis, aneurysm or dissection. Internal iliac occlusion just  beyond its origin. External iliac scattered plaque, no stenosis. Outflow: Common femoral mildly atheromatous, patent. Deep femoral branches patent. SFA scattered calcified plaque with mild stenosis distally Popliteal scattered calcified plaque with tandem mild stenoses above the knee, patent distally Runoff: Anterior and posterior tibial contiguous runoff across the ankle. Peroneal artery is diminutive, seen to the distal calf. Veins: No obvious venous abnormality within the limitations of this arterial phase study. Review of the MIP images confirms the above findings. NON-VASCULAR Lower chest: Trace bilateral pleural fluid. Hepatobiliary: No focal liver abnormality is seen. No gallstones, gallbladder wall thickening, or biliary dilatation. Pancreas: Unremarkable. No pancreatic ductal dilatation or surrounding inflammatory changes. Spleen: Normal in size without focal abnormality. Adrenals/Urinary Tract: Adrenal glands are unremarkable. Kidneys are normal, without renal calculi, focal lesion, or hydronephrosis. Bladder is unremarkable. Stomach/Bowel: Stomach decompressed. Small bowel nondilated, unremarkable. Appendix not discretely identified. Mild proximal colonic distension, decompressed distally with scattered sigmoid diverticula; no significant adjacent inflammatory/edematous change. Lymphatic: No abdominal or pelvic adenopathy. Reproductive: Uterus and bilateral adnexa are unremarkable. Other: No ascites.  No free air. Musculoskeletal: Spondylitic changes in the lower lumbar spine. Multilevel bilateral knee DJD involving primarily lateral compartments. No fracture or worrisome bone lesion. IMPRESSION: 1. Aortic atherosclerosis (ICD10-170.0) with mild visceral and renal ostial stenoses. 2. Scattered bilateral lower extremity arterial inflow and outflow disease with no high-grade stenosis, embolus, or occlusion. 3. Limited assessment of distal right lower extremity tibial runoff due to scan timing. Cannot  confidently exclude distal tibial occlusive or embolic disease. 4. Two-vessel left lower extremity tibial runoff. 5. Lower lumbar spondylitic change. 6. Sigmoid diverticulosis. Electronically Signed   By: Corlis Leak M.D.   On: 03/20/2021 15:48   MR HIP LEFT WO CONTRAST  Result Date: 03/21/2021 CLINICAL DATA:  Hip pain, stress fracture suspected, neg xray EXAM: MR OF THE LEFT HIP WITHOUT CONTRAST TECHNIQUE: Multiplanar, multisequence MR imaging was performed. No intravenous contrast was administered. COMPARISON:  Radiograph 03/20/2021 FINDINGS: Bones: There is mild disruption of the lesser trochanter cortex from tendon avulsion. There is no displaced fracture. There is no focal bone lesion. There is subchondral bony edema in the left inferior SI joint, this is on both sides of the joint in the sacrum and iliac bone and likely degenerative. Lower lumbar spine degenerative change. Articular cartilage and labrum Articular cartilage:  Mild to moderate chondrosis. Labrum: Degenerative superior labral tearing. Joint or bursal effusion Joint effusion: Small hip effusion. Bursae: There is fluid within the iliopsoas bursa, reactive from tendon tear. No evidence of trochanteric bursitis. Muscles and tendons Muscles and tendons: There is avulsion of the iliopsoas tendon from its insertion on the lesser trochanter, with differentially retracted fibers, overall retraction measuring approximately 5.5 cm. There is intramuscular edema in the left gluteal muscles, likely reactive.  The distal gluteal tendons appear intact. Degenerative signal at the proximal hamstrings tendons likely normal for age. Reactive muscle edema in the adductors of the left hip. Other findings Miscellaneous: Trace free fluid in the pelvis. Otherwise, the visualized structures are unremarkable. IMPRESSION: Acute full-thickness tear/avulsion of the distal iliopsoas tendon from its insertion on the lesser trochanter of the left femur. Mild disruption of the  lesser trochanter bony cortex, with preservation of the majority of bony architecture. No evidence of underlying bony lesion in the left femur. Reactive joint effusion, iliopsoas bursitis, edema in the adductors, and edema in the gluteal musculature. Mild to moderate left hip osteoarthritis with degenerative superior labral tearing. Lower lumbar spine and left sacroiliac joint degenerative change. Electronically Signed   By: Caprice Renshaw   On: 03/21/2021 08:15   DG Knee Complete 4 Views Left  Result Date: 03/20/2021 CLINICAL DATA:  Left knee pain EXAM: LEFT KNEE - COMPLETE 4+ VIEW COMPARISON:  None. FINDINGS: No evidence of fracture, dislocation, or joint effusion. Moderate tricompartmental osteoarthritis. Prominent vascular calcifications. IMPRESSION: 1. No acute osseous abnormality, left knee. 2. Moderate tricompartmental osteoarthritis. Electronically Signed   By: Duanne Guess D.O.   On: 03/20/2021 13:51   DG Hip Unilat W or Wo Pelvis 2-3 Views Left  Result Date: 03/20/2021 CLINICAL DATA:  Acute left hip and leg pain, no reported injury. EXAM: DG HIP (WITH OR WITHOUT PELVIS) 2-3V LEFT COMPARISON:  None. FINDINGS: Hip joint space is relatively well maintained bilaterally. Mild subchondral sclerosis along the acetabular roofs bilaterally. No acute osseous abnormality. Degenerative changes in the spine. IMPRESSION: 1. No acute findings. 2. Mild bilateral hip osteoarthritis. Electronically Signed   By: Leanna Battles M.D.   On: 03/20/2021 13:48    Review of Systems  HENT: Negative for ear discharge, ear pain, hearing loss and tinnitus.   Eyes: Negative for photophobia and pain.  Respiratory: Negative for cough and shortness of breath.   Cardiovascular: Negative for chest pain.  Gastrointestinal: Negative for abdominal pain, nausea and vomiting.  Genitourinary: Negative for dysuria, flank pain, frequency and urgency.  Musculoskeletal: Positive for arthralgias (Left hip/knee). Negative for back  pain, myalgias and neck pain.  Neurological: Negative for dizziness and headaches.  Hematological: Does not bruise/bleed easily.  Psychiatric/Behavioral: The patient is not nervous/anxious.    Blood pressure (!) 142/69, pulse 72, temperature (!) 97.5 F (36.4 C), temperature source Oral, resp. rate 16, height 5\' 4"  (1.626 m), weight 61.2 kg, SpO2 98 %. Physical Exam Constitutional:      General: She is not in acute distress.    Appearance: She is well-developed. She is not diaphoretic.  HENT:     Head: Normocephalic and atraumatic.  Eyes:     General: No scleral icterus.       Right eye: No discharge.        Left eye: No discharge.     Conjunctiva/sclera: Conjunctivae normal.  Cardiovascular:     Rate and Rhythm: Normal rate and regular rhythm.  Pulmonary:     Effort: Pulmonary effort is normal. No respiratory distress.  Musculoskeletal:     Cervical back: Normal range of motion.     Comments: LLE No traumatic wounds, ecchymosis, or rash  Mild TTP hip/knee  No knee or ankle effusion  Knee stable to varus/ valgus and anterior/posterior stress  Sens DPN, SPN, TN intact  Motor EHL, ext, flex, evers 5/5  DP 1+, PT 1+, No significant edema  Skin:    General: Skin is warm  and dry.  Neurological:     Mental Status: She is alert.  Psychiatric:        Behavior: Behavior normal.     Assessment/Plan: Left iliopsoas tear -- Will manage non-operatively with WBAT and limit hip flexion to 90 degrees. PT/OT, f/u with Dr. Aundria Rud in 3 weeks. Multiple medical problems including hypertension, history of CVA with residual left hemiparesis, and hypothyroidism -- per primary service    Freeman Caldron, PA-C Orthopedic Surgery [phone] 03/21/2021, 9:31 AM

## 2021-03-21 NOTE — ED Provider Notes (Signed)
MRI shows avulsion of the left iliopsoas tendon with retraction of the tendon by 5.8 cm.  No fracture or dislocation.  Small left hip effusion.  Mild left hip degenerative arthritis.   No fever.  Pain remains intractable.  Patient unable to stand or bear weight which is a significant change from her baseline.  Does have some transient improvement with IV narcotics  Given MRI results, will plan for admission to hospitalist service for pain control and possible orthopedic consult, PT and OT.  Discussed with Dr. Loney Loh.   Glynn Octave, MD 03/21/21 [phone]

## 2021-03-22 DIAGNOSIS — E039 Hypothyroidism, unspecified: Secondary | ICD-10-CM | POA: Diagnosis not present

## 2021-03-22 DIAGNOSIS — I10 Essential (primary) hypertension: Secondary | ICD-10-CM | POA: Diagnosis not present

## 2021-03-22 DIAGNOSIS — Z8673 Personal history of transient ischemic attack (TIA), and cerebral infarction without residual deficits: Secondary | ICD-10-CM | POA: Diagnosis not present

## 2021-03-22 DIAGNOSIS — M25559 Pain in unspecified hip: Secondary | ICD-10-CM | POA: Diagnosis not present

## 2021-03-22 MED ORDER — SENNOSIDES-DOCUSATE SODIUM 8.6-50 MG PO TABS
2.0000 | ORAL_TABLET | Freq: Every day | ORAL | Status: DC
  Administered 2021-03-22 – 2021-03-26 (×3): 2 via ORAL
  Filled 2021-03-22 (×6): qty 2

## 2021-03-22 NOTE — [place] FL2 (Signed)
[place] MEDICAID FL2 LEVEL OF CARE SCREENING TOOL     IDENTIFICATION  Patient Name: Nicole [name] Birthdate: 06-06-1930 Sex: female Admission Date (Current Location): 03/20/2021  [facility] and [place] Number:  Producer, television/film/video and Address:         Provider Number: [id number]  Attending Physician Name and Address:  Maretta Bees, MD  Relative Name and Phone Number:       Current Level of Care: Hospital Recommended Level of Care: Skilled Nursing Facility Prior Approval Number:    Date Approved/Denied:   PASRR Number: [id number]  Discharge Plan: SNF    Current Diagnoses: Patient Active Problem List   Diagnosis Date Noted  . Hip pain 03/21/2021  . History of CVA (cerebrovascular accident) 03/21/2021  . Spasticity 11/30/2020  . Wheelchair dependence 10/07/2020  . Left knee pain 07/26/2020  . Cerebrovascular accident (CVA) of right basal ganglia (HCC) 07/26/2020  . Left hemiparesis (HCC) 07/25/2020  . Hypokalemia 07/20/2020  . Right basal ganglia embolic stroke (HCC) 07/20/2020  . HTN (hypertension) 07/20/2020  . Hypothyroidism 07/20/2020    Orientation RESPIRATION BLADDER Height & Weight     Self,Time,Situation,Place  Normal External catheter Weight: 61.2 kg Height:  5\' 4"  (162.6 cm)  BEHAVIORAL SYMPTOMS/MOOD NEUROLOGICAL BOWEL NUTRITION STATUS      Continent Diet (refer to d/c summary)  AMBULATORY STATUS COMMUNICATION OF NEEDS Skin   Extensive Assist Verbally Normal                       Personal Care Assistance Level of Assistance  Bathing,Feeding,Dressing Bathing Assistance: Maximum assistance Feeding assistance: Limited assistance Dressing Assistance: Maximum assistance     Functional Limitations Info  Sight,Hearing,Speech Sight Info: Adequate Hearing Info: Adequate Speech Info: Adequate    SPECIAL CARE FACTORS FREQUENCY  PT (By licensed PT),OT (By licensed OT)     PT Frequency: 5x/week, evaluate and treat OT Frequency: 5x/week,  evaluate and treat            Contractures Contractures Info: Not present    Additional Factors Info  Code Status,Allergies Code Status Info: full code Allergies Info: No Known Allergies           Current Medications (03/22/2021):  This is the current hospital active medication list Current Facility-Administered Medications  Medication Dose Route Frequency Provider Last Rate Last Admin  . acetaminophen (TYLENOL) tablet 1,000 mg  1,000 mg Oral Q8H Ghimire, 03/24/2021, MD   1,000 mg at 03/22/21 1234  . albuterol (VENTOLIN HFA) 108 (90 Base) MCG/ACT inhaler 1 puff  1 puff Inhalation Q4H PRN 03/24/21, MD      . amLODipine (NORVASC) tablet 10 mg  10 mg Oral Daily John Giovanni, MD   10 mg at 03/22/21 0932  . atorvastatin (LIPITOR) tablet 80 mg  80 mg Oral Daily 03/24/21, MD   80 mg at 03/22/21 0932  . baclofen (LIORESAL) tablet 10 mg  10 mg Oral TID PRN 03/24/21, MD   10 mg at 03/21/21 1816  . clopidogrel (PLAVIX) tablet 75 mg  75 mg Oral Daily 03/23/21, MD   75 mg at 03/22/21 0932  . enoxaparin (LOVENOX) injection 40 mg  40 mg Subcutaneous Q24H 03/24/21, MD   40 mg at 03/22/21 1234  . hydrochlorothiazide (HYDRODIURIL) tablet 25 mg  25 mg Oral Daily 03/24/21, MD   25 mg at 03/22/21 0932  . HYDROmorphone (DILAUDID) injection 0.5 mg  0.5 mg Intravenous Q4H  PRN John Giovanni, MD   0.5 mg at 03/21/21 0933  . levothyroxine (SYNTHROID) tablet 50 mcg  50 mcg Oral Q0600 John Giovanni, MD   50 mcg at 03/22/21 0526  . lisinopril (ZESTRIL) tablet 20 mg  20 mg Oral Daily John Giovanni, MD   20 mg at 03/22/21 0932  . loratadine (CLARITIN) tablet 10 mg  10 mg Oral Daily John Giovanni, MD   10 mg at 03/22/21 0932  . ondansetron (ZOFRAN) injection 4 mg  4 mg Intravenous Q6H PRN Maretta Bees, MD      . oxybutynin (DITROPAN-XL) 24 hr tablet 5 mg  5 mg Oral Daily John Giovanni, MD   5 mg at 03/22/21 0932  .  oxyCODONE (Oxy IR/ROXICODONE) immediate release tablet 5 mg  5 mg Oral Q6H PRN Maretta Bees, MD   5 mg at 03/22/21 0932  . pantoprazole (PROTONIX) EC tablet 40 mg  40 mg Oral Daily Maretta Bees, MD   40 mg at 03/22/21 0932  . polyethylene glycol (MIRALAX / GLYCOLAX) packet 17 g  17 g Oral Daily Maretta Bees, MD   17 g at 03/22/21 0935  . senna-docusate (Senokot-S) tablet 2 tablet  2 tablet Oral QHS Ghimire, Werner Lean, MD         Discharge Medications: Please see discharge summary for a list of discharge medications.  Relevant Imaging Results:  Relevant Lab Results:   Additional Information SS# [id number]  Epifanio Lesches, RN

## 2021-03-22 NOTE — Progress Notes (Signed)
Inpatient Rehab Admissions Coordinator:   Consult received.  At this time pt does not have the medical necessity to support a CIR admission, nor would we be able to get authorization with SNF recommendations from therapy, which are appropriate.  Will sign off at this time.   Estill Dooms, PT, DPT Admissions Coordinator [phone] 03/22/21  4:49 PM

## 2021-03-22 NOTE — Progress Notes (Signed)
PROGRESS NOTE        PATIENT DETAILS Name: Nicole [name] Age: 87 y.o. Sex: female Date of Birth: 07/23/1930 Admit Date: 03/20/2021 Admitting Physician John GiovanniVasundhra Rathore, MD ACZ:[id number]:Aronson, Gerlene Burdockichard, MD  Brief Narrative: Patient is a 87 y.o. female history of CVA with residual left-sided weakness, HTN, hypothyroidism-who presented with severe left hip pain-further evaluation left iliopsoas tear/avulsion.  Significant events: 5/16>> admit for evaluation of left hip pain-found to have left iliopsoas tear.  No fractures.  Significant studies: 5/16 >>CT angio aorta/bifemoral: No high-grade stenosis/embolus/occlusion, sigmoid diverticulosis. 5/16>> x-ray left hip: No acute findings. 5/16>> x-ray left knee: No acute abnormalities. 5/16>> MRI left hip: Acute full-thickness tear of distal iliopsoas tendon.  No evidence of fracture  Antimicrobial therapy: None  Microbiology data: 5/17>> influenza/COVID PCR: Negative  Procedures : None  Consults: Orthopedics  DVT Prophylaxis : enoxaparin (LOVENOX) injection 40 mg Start: 03/21/21 1200 SCDs Start: 03/21/21 0218   Subjective: Continues to have significant pain with movement.  However less IV narcotic requirement overnight.  Assessment/Plan: Acute full-thickness tear of the distal left iliopsoas tendon: Evaluated by orthopedics-recommendations are for nonoperative management, WBAT, limit hip flexion to 90 degrees.  Attempt to titrate off IV narcotics-NCF pain is controlled with oral narcotics and scheduled Tylenol.  Social work/case management following for potential SNF rehab.  At family's request-have consulted CIR.  HTN: BP stable-continue amlodipine/HCTZ and lisinopril.  Hypothyroidism: Continue Synthroid  History of CVA with mild left-sided weakness: Continue Plavix/statin.  Diet: Diet Order            Diet Heart Room service appropriate? Yes; Fluid consistency: Thin  Diet effective now                   Code Status: Full code   Family Communication: Daughter at bedside   Disposition Plan: Status is: Inpatient.  The patient will require care spanning > 2 midnights and should be moved to inpatient because: Inpatient level of care appropriate due to severity of illness  Dispo: The patient is from: Home              Anticipated d/c is to: SNF              Patient currently is not medically stable to d/c.   Difficult to place patient No    Barriers to Discharge: Severe left hip pain-requiring IV narcotics-see above.  Antimicrobial agents: Anti-infectives (From admission, onward)   None       Time spent: 25 minutes-Greater than 50% of this time was spent in counseling, explanation of diagnosis, planning of further management, and coordination of care.  MEDICATIONS: Scheduled Meds: . acetaminophen  1,000 mg Oral Q8H  . amLODipine  10 mg Oral Daily  . atorvastatin  80 mg Oral Daily  . clopidogrel  75 mg Oral Daily  . enoxaparin (LOVENOX) injection  40 mg Subcutaneous Q24H  . hydrochlorothiazide  25 mg Oral Daily  . levothyroxine  50 mcg Oral Q0600  . lisinopril  20 mg Oral Daily  . loratadine  10 mg Oral Daily  . oxybutynin  5 mg Oral Daily  . pantoprazole  40 mg Oral Daily  . polyethylene glycol  17 g Oral Daily  . senna-docusate  2 tablet Oral QHS   Continuous Infusions: PRN Meds:.albuterol, baclofen, HYDROmorphone (DILAUDID) injection, ondansetron (ZOFRAN) IV, oxyCODONE   PHYSICAL EXAM:  Vital signs: Vitals:   03/21/21 2012 03/22/21 0502 03/22/21 0700 03/22/21 1148  BP: (!) 119/50 140/78 (!) 138/52 (!) 142/62  Pulse: 73 72 65 76  Resp: 17 17 17 16   Temp: 98.4 F (36.9 C) 98.1 F (36.7 C) 97.7 F (36.5 C) 98.9 F (37.2 C)  TempSrc: Oral Oral Oral Oral  SpO2: 99% 96% 96% 93%  Weight:      Height:       Filed Weights   03/20/21 1117  Weight: 61.2 kg   Body mass index is 23.17 kg/m.   Gen Exam:Alert awake-not in any  distress HEENT:atraumatic, normocephalic Chest: B/L clear to auscultation anteriorly CVS:S1S2 regular Abdomen:soft non tender, non distended Extremities:no edema Neurology: Non focal Skin: no rash  I have personally reviewed following labs and imaging studies  LABORATORY DATA: CBC: Recent Labs  Lab 03/20/21 1219  WBC 7.3  NEUTROABS 4.5  HGB 12.9  HCT 40.8  MCV 98.1  PLT 220    Basic Metabolic Panel: Recent Labs  Lab 03/20/21 1219  NA 139  K 4.2  CL 105  CO2 27  GLUCOSE 97  BUN 14  CREATININE 0.79  CALCIUM 9.3    GFR: Estimated Creatinine Clearance: 42 mL/min (by C-G formula based on SCr of 0.79 mg/dL).  Liver Function Tests: No results for input(s): AST, ALT, ALKPHOS, BILITOT, PROT, ALBUMIN in the last 168 hours. No results for input(s): LIPASE, AMYLASE in the last 168 hours. No results for input(s): AMMONIA in the last 168 hours.  Coagulation Profile: No results for input(s): INR, PROTIME in the last 168 hours.  Cardiac Enzymes: No results for input(s): CKTOTAL, CKMB, CKMBINDEX, TROPONINI in the last 168 hours.  BNP (last 3 results) No results for input(s): PROBNP in the last 8760 hours.  Lipid Profile: No results for input(s): CHOL, HDL, LDLCALC, TRIG, CHOLHDL, LDLDIRECT in the last 72 hours.  Thyroid Function Tests: No results for input(s): TSH, T4TOTAL, FREET4, T3FREE, THYROIDAB in the last 72 hours.  Anemia Panel: No results for input(s): VITAMINB12, FOLATE, FERRITIN, TIBC, IRON, RETICCTPCT in the last 72 hours.  Urine analysis:    Component Value Date/Time   COLORURINE STRAW (A) 07/21/2020 0228   APPEARANCEUR CLEAR 07/21/2020 0228   LABSPEC 1.012 07/21/2020 0228   PHURINE 8.0 07/21/2020 0228   GLUCOSEU NEGATIVE 07/21/2020 0228   HGBUR NEGATIVE 07/21/2020 0228   BILIRUBINUR NEGATIVE 07/21/2020 0228   KETONESUR NEGATIVE 07/21/2020 0228   PROTEINUR NEGATIVE 07/21/2020 0228   NITRITE NEGATIVE 07/21/2020 0228   LEUKOCYTESUR NEGATIVE  07/21/2020 0228    Sepsis Labs: Lactic Acid, Venous No results found for: LATICACIDVEN  MICROBIOLOGY: Recent Results (from the past 240 hour(s))  Resp Panel by RT-PCR (Flu A&B, Covid) Nasopharyngeal Swab     Status: None   Collection Time: 03/21/21 12:57 AM   Specimen: Nasopharyngeal Swab; Nasopharyngeal(NP) swabs in vial transport medium  Result Value Ref Range Status   SARS Coronavirus 2 by RT PCR NEGATIVE NEGATIVE Final    Comment: (NOTE) SARS-CoV-2 target nucleic acids are NOT DETECTED.  The SARS-CoV-2 RNA is generally detectable in upper respiratory specimens during the acute phase of infection. The lowest concentration of SARS-CoV-2 viral copies this assay can detect is 138 copies/mL. A negative result does not preclude SARS-Cov-2 infection and should not be used as the sole basis for treatment or other patient management decisions. A negative result may occur with  improper specimen collection/handling, submission of specimen other than nasopharyngeal swab, presence of viral mutation(s) within the areas  targeted by this assay, and inadequate number of viral copies(<138 copies/mL). A negative result must be combined with clinical observations, patient history, and epidemiological information. The expected result is Negative.  Fact Sheet for Patients:  BloggerCourse.com  Fact Sheet for Healthcare Providers:  SeriousBroker.it  This test is no t yet approved or cleared by the [place] FDA and  has been authorized for detection and/or diagnosis of SARS-CoV-2 by FDA under an Emergency Use Authorization (EUA). This EUA will remain  in effect (meaning this test can be used) for the duration of the COVID-19 declaration under Section 564(b)(1) of the Act, 21 U.S.C.section 360bbb-3(b)(1), unless the authorization is terminated  or revoked sooner.       Influenza A by PCR NEGATIVE NEGATIVE Final   Influenza B by PCR  NEGATIVE NEGATIVE Final    Comment: (NOTE) The Xpert Xpress SARS-CoV-2/FLU/RSV plus assay is intended as an aid in the diagnosis of influenza from Nasopharyngeal swab specimens and should not be used as a sole basis for treatment. Nasal washings and aspirates are unacceptable for Xpert Xpress SARS-CoV-2/FLU/RSV testing.  Fact Sheet for Patients: BloggerCourse.com  Fact Sheet for Healthcare Providers: SeriousBroker.it  This test is not yet approved or cleared by the [place] FDA and has been authorized for detection and/or diagnosis of SARS-CoV-2 by FDA under an Emergency Use Authorization (EUA). This EUA will remain in effect (meaning this test can be used) for the duration of the COVID-19 declaration under Section 564(b)(1) of the Act, 21 U.S.C. section 360bbb-3(b)(1), unless the authorization is terminated or revoked.  Performed at [facility], [address] [place], [place] [address]     RADIOLOGY STUDIES/RESULTS: CT ANGIO AO+BIFEM W & OR WO CONTRAST  Result Date: 03/20/2021 CLINICAL DATA:  Leg pain, arterial embolism EXAM: CT ANGIOGRAPHY OF ABDOMINAL AORTA WITH ILIOFEMORAL RUNOFF TECHNIQUE: Multidetector CT imaging of the abdomen, pelvis and lower extremities was performed using the standard protocol during bolus administration of intravenous contrast. Multiplanar CT image reconstructions and MIPs were obtained to evaluate the vascular anatomy. CONTRAST:  OMNIPAQUE IOHEXOL 350 MG/ML SOLN COMPARISON:  None. FINDINGS: VASCULAR Aorta: Moderate calcified atheromatous plaque. No aneurysm, dissection, or stenosis. Celiac: Partially calcified ostial plaque resulting in short segment mild stenosis over length of less than 1 cm, patent distally with unremarkable distal branching. SMA: Calcified ostial plaque resulting in short-segment ostial stenosis less than 1 cm of at least mild severity, widely patent distally with  classic distal branch anatomy. Renals: Single left, with partially calcified ostial plaque and short-segment stenosis of at least moderate severity, patent distally. Single right, with calcified ostial plaque with only mild short-segment stenosis, patent distally. IMA: Patent without evidence of aneurysm, dissection, vasculitis or significant stenosis. RIGHT Lower Extremity Inflow: Common iliac scattered calcified atheromatous plaque Internal iliac atheromatous, patent External iliac minimally atheromatous, patent Outflow: Common femoral mildly atheromatous, patent Deep femoral branches patent. SFA scattered calcified plaque with tandem areas of mild stenosis. Popliteal calcified plaque proximally with short-segment mild stenosis, patent distally across the knee Runoff: There is poor contrast opacification of vessels below the distal calf but which may reflect early scan timing relative to the arterial contrast bolus. Anterior tibial occludes distal calf level. Posterior tibial seen to above the ankle. Peroneal is diminutive, seen to just above the ankle. LEFT Lower Extremity Inflow: Common iliac scattered calcified plaque without high-grade stenosis, aneurysm or dissection. Internal iliac occlusion just beyond its origin. External iliac scattered plaque, no stenosis. Outflow: Common femoral mildly atheromatous,  patent. Deep femoral branches patent. SFA scattered calcified plaque with mild stenosis distally Popliteal scattered calcified plaque with tandem mild stenoses above the knee, patent distally Runoff: Anterior and posterior tibial contiguous runoff across the ankle. Peroneal artery is diminutive, seen to the distal calf. Veins: No obvious venous abnormality within the limitations of this arterial phase study. Review of the MIP images confirms the above findings. NON-VASCULAR Lower chest: Trace bilateral pleural fluid. Hepatobiliary: No focal liver abnormality is seen. No gallstones, gallbladder wall  thickening, or biliary dilatation. Pancreas: Unremarkable. No pancreatic ductal dilatation or surrounding inflammatory changes. Spleen: Normal in size without focal abnormality. Adrenals/Urinary Tract: Adrenal glands are unremarkable. Kidneys are normal, without renal calculi, focal lesion, or hydronephrosis. Bladder is unremarkable. Stomach/Bowel: Stomach decompressed. Small bowel nondilated, unremarkable. Appendix not discretely identified. Mild proximal colonic distension, decompressed distally with scattered sigmoid diverticula; no significant adjacent inflammatory/edematous change. Lymphatic: No abdominal or pelvic adenopathy. Reproductive: Uterus and bilateral adnexa are unremarkable. Other: No ascites.  No free air. Musculoskeletal: Spondylitic changes in the lower lumbar spine. Multilevel bilateral knee DJD involving primarily lateral compartments. No fracture or worrisome bone lesion. IMPRESSION: 1. Aortic atherosclerosis (ICD10-170.0) with mild visceral and renal ostial stenoses. 2. Scattered bilateral lower extremity arterial inflow and outflow disease with no high-grade stenosis, embolus, or occlusion. 3. Limited assessment of distal right lower extremity tibial runoff due to scan timing. Cannot confidently exclude distal tibial occlusive or embolic disease. 4. Two-vessel left lower extremity tibial runoff. 5. Lower lumbar spondylitic change. 6. Sigmoid diverticulosis. Electronically Signed   By: Corlis Leak M.D.   On: 03/20/2021 15:48   MR HIP LEFT WO CONTRAST  Result Date: 03/21/2021 CLINICAL DATA:  Hip pain, stress fracture suspected, neg xray EXAM: MR OF THE LEFT HIP WITHOUT CONTRAST TECHNIQUE: Multiplanar, multisequence MR imaging was performed. No intravenous contrast was administered. COMPARISON:  Radiograph 03/20/2021 FINDINGS: Bones: There is mild disruption of the lesser trochanter cortex from tendon avulsion. There is no displaced fracture. There is no focal bone lesion. There is  subchondral bony edema in the left inferior SI joint, this is on both sides of the joint in the sacrum and iliac bone and likely degenerative. Lower lumbar spine degenerative change. Articular cartilage and labrum Articular cartilage:  Mild to moderate chondrosis. Labrum: Degenerative superior labral tearing. Joint or bursal effusion Joint effusion: Small hip effusion. Bursae: There is fluid within the iliopsoas bursa, reactive from tendon tear. No evidence of trochanteric bursitis. Muscles and tendons Muscles and tendons: There is avulsion of the iliopsoas tendon from its insertion on the lesser trochanter, with differentially retracted fibers, overall retraction measuring approximately 5.5 cm. There is intramuscular edema in the left gluteal muscles, likely reactive. The distal gluteal tendons appear intact. Degenerative signal at the proximal hamstrings tendons likely normal for age. Reactive muscle edema in the adductors of the left hip. Other findings Miscellaneous: Trace free fluid in the pelvis. Otherwise, the visualized structures are unremarkable. IMPRESSION: Acute full-thickness tear/avulsion of the distal iliopsoas tendon from its insertion on the lesser trochanter of the left femur. Mild disruption of the lesser trochanter bony cortex, with preservation of the majority of bony architecture. No evidence of underlying bony lesion in the left femur. Reactive joint effusion, iliopsoas bursitis, edema in the adductors, and edema in the gluteal musculature. Mild to moderate left hip osteoarthritis with degenerative superior labral tearing. Lower lumbar spine and left sacroiliac joint degenerative change. Electronically Signed   By: Caprice Renshaw   On: 03/21/2021 08:15  LOS: 1 day   Jeoffrey Massed, MD  [facility]    To contact the attending provider between 7A-7P or the covering provider during after hours 7P-7A, please log into the web site [email or web address] and access using universal [facility] password for that web site. If you do not have the password, please call the hospital operator.  03/22/2021, 2:06 PM

## 2021-03-22 NOTE — TOC Initial Note (Addendum)
Transition of Care [facility]) - Initial/Assessment Note    Patient Details  Name: Nicole [name] MRN: [id number] Date of Birth: 05/10/1930  Transition of [facility]) CM/SW Contact:    Nicole Lesches, RN Phone Number: 03/22/2021, 6:55 AM  Clinical Narrative:                 NCM spoke with pt /daughter by phone late evening yesterday regarding discharge planning. Pt resides with daughter Nicole [name]  ( primary caretaker). Nicole [name] works full-time , 10 hr shift M-T. States can work from home. NCM shared PT/OT evaluation/recommendations: SNF;Supervision/Assistance - 24 hour. Nicole [name] stated her preference would be CIR and not SNF placement for mom. States mom has had past experience with CIR and would like for her to transition there if possible. NCM shared request with MD.  Pt with DME @ home: rolling walker, hospital bed and W/C.  Pt without problems affording RX meds.   PCP: Nicole [name]  Nicole [name] (Daughter)     [phone]       [facility] team following and will assist with TOC needs.  03/19/2021 @ 1515 Pt/daughter are now in agreement for SNF placement after speaking with MD today. Feels 3 hrs of intense therapy /day might be too much if transitioned CIR. Preference for SNF placement , Nicole [name]. NCM faxed out snf referral ... awaiting bed acceptances. NCM discussed insurance authorization process. Daughter  expressed being hopeful for rehab and for mom to feel better soon. No further questions reported at this time. NCM to continue to follow and assist with discharge planning needs.    Expected Discharge Plan: Skilled Nursing Facility (vs CIR vs Home with home health services) Barriers to Discharge: Continued Medical Work up   Patient Goals and CMS Choice Patient states their goals for this hospitalization and ongoing recovery are:: to get better and go home      Expected Discharge Plan and Services Expected Discharge Plan: Skilled Nursing Facility (vs CIR vs Home with home  health services)   Discharge Planning Services: CM Consult   Living arrangements for the past 2 months: Single Family Home                                      Prior Living Arrangements/Services Living arrangements for the past 2 months: Single Family Home Lives with:: Adult Children Patient language and need for interpreter reviewed:: Yes Do you feel safe going back to the place where you live?: Yes      Need for Family Participation in Patient Care: Yes (Comment) Care giver support system in place?: Yes (comment)   Criminal Activity/Legal Involvement Pertinent to Current Situation/Hospitalization: No - Comment as needed  Activities of Daily Living Home Assistive Devices/Equipment: Dan Humphreys (specify type) ADL Screening (condition at time of admission) Patient's cognitive ability adequate to safely complete daily activities?: Yes Is the patient deaf or have difficulty hearing?: Yes Does the patient have difficulty seeing, even when wearing glasses/contacts?: Yes Does the patient have difficulty concentrating, remembering, or making decisions?: No Patient able to express need for assistance with ADLs?: Yes Does the patient have difficulty dressing or bathing?: Yes Independently performs ADLs?: No Does the patient have difficulty walking or climbing stairs?: Yes Weakness of Legs: Left Weakness of Arms/Hands: Left  Permission Sought/Granted   Permission granted to share information with : Yes, Verbal Permission Granted  Emotional Assessment Appearance:: Appears stated age Attitude/Demeanor/Rapport: Engaged Affect (typically observed): Accepting Orientation: : Oriented to Self,Oriented to Place,Oriented to  Time,Oriented to Situation Alcohol / Substance Use: Not Applicable Psych Involvement: No (comment)  Admission diagnosis:  Gait abnormality [R26.9] Hip pain [M25.559] Left inguinal pain [R10.32] Patient Active Problem List   Diagnosis Date Noted   . Hip pain 03/21/2021  . History of CVA (cerebrovascular accident) 03/21/2021  . Spasticity 11/30/2020  . Wheelchair dependence 10/07/2020  . Left knee pain 07/26/2020  . Cerebrovascular accident (CVA) of right basal ganglia (HCC) 07/26/2020  . Left hemiparesis (HCC) 07/25/2020  . Hypokalemia 07/20/2020  . Right basal ganglia embolic stroke (HCC) 07/20/2020  . HTN (hypertension) 07/20/2020  . Hypothyroidism 07/20/2020   PCP:  Nicole Paradise, MD Pharmacy:   [facility] [phone] Ginette Otto, [place] - [address] AT [facility] [address] [facility] [address] [place] [address] Phone: [phone] Fax: [phone]  [facility] [address] [place] [address] Phone: [phone] Fax: [phone]     Social Determinants of Health (SDOH) Interventions    Readmission Risk Interventions No flowsheet data found.

## 2021-03-22 NOTE — Progress Notes (Signed)
Bedside shift report completed. Received patient awake,alert/orientedx4 and able to verbalize needs. NAD noted; respirations easy on room air. Movement/sensation noted to all extremities.SCDs on to bilateral lower extremities. Whiteboard updated. All safety measures in place and personal belongings within reach.

## 2021-03-22 NOTE — Plan of Care (Signed)

## 2021-03-23 DIAGNOSIS — M25559 Pain in unspecified hip: Secondary | ICD-10-CM | POA: Diagnosis not present

## 2021-03-23 MED ORDER — BISACODYL 10 MG RE SUPP
10.0000 mg | Freq: Once | RECTAL | Status: DC
  Filled 2021-03-23: qty 1

## 2021-03-23 NOTE — Progress Notes (Addendum)
Occupational Therapy Treatment Patient Details Name: Nicole [name] MRN: [id number] DOB: 08-17-1930 Today's Date: 03/23/2021    History of present illness Patient is a 87 y.o. female-who presented with severe left hip pain-further evaluation left iliopsoas tear/avulsion. Nonoperative management, WBAT, limit hip flexion to 90 degrees. She lives upstairs and gets down the stairs by sitting down and bumping down on her own. She denies any antecedent event. History of CVA with residual left-sided weakness, HTN, hypothyroidism   OT comments  Rynlee is progressing well towards her goals, she is eager to participate with therapy. Fina had improved bed mobility today with min guard from supine to sitting. Alexyia was min A +1 to transfer from sitting>stand. She fatigues quickly and progressed to max+2 for stand-pivot with rw from bed>chair. Deleah complete 3 trials of sit<>stand tranfers with rw, and stood about 30 seconds each time. She stated that she wears a L knee brace at baseline, however it was not in the room at the time of this session. Serene continues to benefit from acute OT to progress function in ADLs and mobility. D.c plan remains appropriate.     Follow Up Recommendations  SNF;Supervision/Assistance - 24 hour    Equipment Recommendations  Tub/shower bench       Precautions / Restrictions Precautions Precautions: Fall Precaution Comments: WBAT, limit hip flexion to 90 degrees. Required Braces or Orthoses: Other Brace (pt reports she wears a L knee brace at baseline, not in room at this time) Restrictions Weight Bearing Restrictions: No LLE Weight Bearing: Weight bearing as tolerated       Mobility Bed Mobility Overal bed mobility: Needs Assistance Bed Mobility: Rolling;Sidelying to Sit;Sit to Supine Rolling: Min guard Sidelying to sit: Min guard   Sit to supine: Mod assist   General bed mobility comments: pt with improved bed mobility this session, mod A for BLE management back into  bed    Transfers Overall transfer level: Needs assistance Equipment used: Rolling walker (2 wheeled) Transfers: Sit to/from [employer] Sit to Stand: Min assist;+2 physical assistance Stand pivot transfers: Max assist;+2 physical assistance       General transfer comment: Pt starts as light in guard and quickly fatigues and requires max A for standing balance, LLE fatigues quickly    Balance Overall balance assessment: Needs assistance Sitting-balance support: No upper extremity supported;Feet supported Sitting balance-Leahy Scale: Fair     Standing balance support: Bilateral upper extremity supported Standing balance-Leahy Scale: Poor           ADL either performed or assessed with clinical judgement   ADL Overall ADL's : Needs assistance/impaired           Lower Body Dressing Details (indicate cue type and reason): total A for donning bilat socks to maintain L hip precaution Toilet Transfer: Maximal assistance;+2 for physical assistance;BSC           Functional mobility during ADLs: Maximal assistance;+2 for physical assistance;Rolling walker General ADL Comments: Pt required total A for donning bilat socks               Cognition Arousal/Alertness: Awake/alert Behavior During Therapy: WFL for tasks assessed/performed Overall Cognitive Status: No family/caregiver present to determine baseline cognitive functioning             General Comments: Pt very pleasant for therapy, will try anything asked of her              General Comments no apparent skin integrity issues    Pertinent  Vitals/ Pain       Pain Assessment: Faces Faces Pain Scale: Hurts a little bit Pain Location: L hip Pain Descriptors / Indicators: Aching;Grimacing Pain Intervention(s): Limited activity within patient's tolerance;Monitored during session         Frequency  Min 2X/week        Progress Toward Goals  OT Goals(current goals can now be found  in the care plan section)  Progress towards OT goals: Progressing toward goals  Acute Rehab OT Goals Patient Stated Goal: to get OT Goal Formulation: With patient Time For Goal Achievement: 04/04/21 Potential to Achieve Goals: Fair ADL Goals Pt Will Perform Lower Body Bathing: with set-up;sit to/from stand Pt Will Perform Lower Body Dressing: with min guard assist;sit to/from stand Pt Will Transfer to Toilet: with min guard assist;ambulating Additional ADL Goal #1: Pt will recall at least 3 fall prevention strategies to ensure safe transition into the next venue  Plan Discharge plan remains appropriate       AM-PAC OT "6 Clicks" Daily Activity     Outcome Measure   Help from another person eating meals?: A Little Help from another person taking care of personal grooming?: A Little Help from another person toileting, which includes using toliet, bedpan, or urinal?: A Lot Help from another person bathing (including washing, rinsing, drying)?: A Lot Help from another person to put on and taking off regular upper body clothing?: A Little Help from another person to put on and taking off regular lower body clothing?: A Lot 6 Click Score: 15    End of Session Equipment Utilized During Treatment: Gait belt;Rolling walker  OT Visit Diagnosis: Unsteadiness on feet (R26.81);Muscle weakness (generalized) (M62.81);Pain Pain - Right/Left: Left Pain - part of body: Hip   Activity Tolerance Patient tolerated treatment well   Patient Left in chair;with call bell/phone within reach;Other (comment) (PT in room)   Nurse Communication Mobility status        Time: 9518-8416 OT Time Calculation (min): 22 min  Charges: OT Treatments $Self Care/Home Management : 8-22 mins    Jossalyn Forgione A Leann Mayweather 03/23/2021, 5:10 PM

## 2021-03-23 NOTE — Progress Notes (Signed)
PROGRESS NOTE        PATIENT DETAILS Name: Nicole [name] Age: 87 y.o. Sex: female Date of Birth: 1930-09-12 Admit Date: 03/20/2021 Admitting Physician John Giovanni, MD [id number], Gerlene Burdock, MD  Brief Narrative: Patient is a 87 y.o. female history of CVA with residual left-sided weakness, HTN, hypothyroidism-who presented with severe left hip pain-further evaluation left iliopsoas tear/avulsion.  Significant events: 5/16>> admit for evaluation of left hip pain-found to have left iliopsoas tear.  No fractures.  Significant studies: 5/16 >>CT angio aorta/bifemoral: No high-grade stenosis/embolus/occlusion, sigmoid diverticulosis. 5/16>> x-ray left hip: No acute findings. 5/16>> x-ray left knee: No acute abnormalities. 5/16>> MRI left hip: Acute full-thickness tear of distal iliopsoas tendon.  No evidence of fracture  Antimicrobial therapy: None  Microbiology data: 5/17>> influenza/COVID PCR: Negative  Procedures : None  Consults: Orthopedics  DVT Prophylaxis : enoxaparin (LOVENOX) injection 40 mg Start: 03/21/21 1200 SCDs Start: 03/21/21 6503   Subjective: Lying comfortably in bed-pain in the left hip seems to be a bit better.  Assessment/Plan: Acute full-thickness tear of the distal left iliopsoas tendon: Evaluated by orthopedics-recommendations are for nonoperative management, WBAT, limit hip flexion to 90 degrees.  Attempt to titrate off IV narcotics-NCF pain is controlled with oral narcotics and scheduled Tylenol.  Social work/case management following for potential SNF rehab.    HTN: BP stable-continue amlodipine/HCTZ and lisinopril.  Hypothyroidism: Continue Synthroid  History of CVA with mild left-sided weakness: Continue Plavix/statin.  Diet: Diet Order            Diet Heart Room service appropriate? Yes; Fluid consistency: Thin  Diet effective now                  Code Status: Full code   Family  Communication: Daughter -[name] over the phone on 5/19   Disposition Plan: Status is: Inpatient.  The patient will require care spanning > 2 midnights and should be moved to inpatient because: Inpatient level of care appropriate due to severity of illness  Dispo: The patient is from: Home              Anticipated d/c is to: SNF              Patient currently is  medically stable to d/c.   Difficult to place patient No    Barriers to Discharge: awaiting SNF  Antimicrobial agents: Anti-infectives (From admission, onward)   None       Time spent: 25 minutes-Greater than 50% of this time was spent in counseling, explanation of diagnosis, planning of further management, and coordination of care.  MEDICATIONS: Scheduled Meds: . acetaminophen  1,000 mg Oral Q8H  . amLODipine  10 mg Oral Daily  . atorvastatin  80 mg Oral Daily  . bisacodyl  10 mg Rectal Once  . clopidogrel  75 mg Oral Daily  . enoxaparin (LOVENOX) injection  40 mg Subcutaneous Q24H  . hydrochlorothiazide  25 mg Oral Daily  . levothyroxine  50 mcg Oral Q0600  . lisinopril  20 mg Oral Daily  . loratadine  10 mg Oral Daily  . oxybutynin  5 mg Oral Daily  . pantoprazole  40 mg Oral Daily  . polyethylene glycol  17 g Oral Daily  . senna-docusate  2 tablet Oral QHS   Continuous Infusions: PRN Meds:.albuterol, baclofen, HYDROmorphone (DILAUDID) injection, ondansetron (ZOFRAN) IV, oxyCODONE  PHYSICAL EXAM: Vital signs: Vitals:   03/22/21 1148 03/22/21 1941 03/23/21 0531 03/23/21 0948  BP: (!) 142/62 (!) 117/58 (!) 177/73 (!) 136/54  Pulse: 76 74 73 75  Resp: 16 16 16 17   Temp: 98.9 F (37.2 C) 98.1 F (36.7 C) 97.7 F (36.5 C) 98.6 F (37 C)  TempSrc: Oral Oral Oral Oral  SpO2: 93% 95% 99% 96%  Weight:      Height:       Filed Weights   03/20/21 1117  Weight: 61.2 kg   Body mass index is 23.17 kg/m.   Gen Exam:Alert awake-not in any distress HEENT:atraumatic,  normocephalic Chest: B/L clear to auscultation anteriorly CVS:S1S2 regular Abdomen:soft non tender, non distended Extremities:no edema Neurology: Non focal Skin: no rash  I have personally reviewed following labs and imaging studies  LABORATORY DATA: CBC: Recent Labs  Lab 03/20/21 1219  WBC 7.3  NEUTROABS 4.5  HGB 12.9  HCT 40.8  MCV 98.1  PLT 220    Basic Metabolic Panel: Recent Labs  Lab 03/20/21 1219  NA 139  K 4.2  CL 105  CO2 27  GLUCOSE 97  BUN 14  CREATININE 0.79  CALCIUM 9.3    GFR: Estimated Creatinine Clearance: 42 mL/min (by C-G formula based on SCr of 0.79 mg/dL).  Liver Function Tests: No results for input(s): AST, ALT, ALKPHOS, BILITOT, PROT, ALBUMIN in the last 168 hours. No results for input(s): LIPASE, AMYLASE in the last 168 hours. No results for input(s): AMMONIA in the last 168 hours.  Coagulation Profile: No results for input(s): INR, PROTIME in the last 168 hours.  Cardiac Enzymes: No results for input(s): CKTOTAL, CKMB, CKMBINDEX, TROPONINI in the last 168 hours.  BNP (last 3 results) No results for input(s): PROBNP in the last 8760 hours.  Lipid Profile: No results for input(s): CHOL, HDL, LDLCALC, TRIG, CHOLHDL, LDLDIRECT in the last 72 hours.  Thyroid Function Tests: No results for input(s): TSH, T4TOTAL, FREET4, T3FREE, THYROIDAB in the last 72 hours.  Anemia Panel: No results for input(s): VITAMINB12, FOLATE, FERRITIN, TIBC, IRON, RETICCTPCT in the last 72 hours.  Urine analysis:    Component Value Date/Time   COLORURINE STRAW (A) 07/21/2020 0228   APPEARANCEUR CLEAR 07/21/2020 0228   LABSPEC 1.012 07/21/2020 0228   PHURINE 8.0 07/21/2020 0228   GLUCOSEU NEGATIVE 07/21/2020 0228   HGBUR NEGATIVE 07/21/2020 0228   BILIRUBINUR NEGATIVE 07/21/2020 0228   KETONESUR NEGATIVE 07/21/2020 0228   PROTEINUR NEGATIVE 07/21/2020 0228   NITRITE NEGATIVE 07/21/2020 0228   LEUKOCYTESUR NEGATIVE 07/21/2020 0228    Sepsis  Labs: Lactic Acid, Venous No results found for: LATICACIDVEN  MICROBIOLOGY: Recent Results (from the past 240 hour(s))  Resp Panel by RT-PCR (Flu A&B, Covid) Nasopharyngeal Swab     Status: None   Collection Time: 03/21/21 12:57 AM   Specimen: Nasopharyngeal Swab; Nasopharyngeal(NP) swabs in vial transport medium  Result Value Ref Range Status   SARS Coronavirus 2 by RT PCR NEGATIVE NEGATIVE Final    Comment: (NOTE) SARS-CoV-2 target nucleic acids are NOT DETECTED.  The SARS-CoV-2 RNA is generally detectable in upper respiratory specimens during the acute phase of infection. The lowest concentration of SARS-CoV-2 viral copies this assay can detect is 138 copies/mL. A negative result does not preclude SARS-Cov-2 infection and should not be used as the sole basis for treatment or other patient management decisions. A negative result may occur with  improper specimen collection/handling, submission of specimen other than nasopharyngeal swab, presence of viral mutation(s)  within the areas targeted by this assay, and inadequate number of viral copies(<138 copies/mL). A negative result must be combined with clinical observations, patient history, and epidemiological information. The expected result is Negative.  Fact Sheet for Patients:  BloggerCourse.com  Fact Sheet for Healthcare Providers:  SeriousBroker.it  This test is no t yet approved or cleared by the [place] FDA and  has been authorized for detection and/or diagnosis of SARS-CoV-2 by FDA under an Emergency Use Authorization (EUA). This EUA will remain  in effect (meaning this test can be used) for the duration of the COVID-19 declaration under Section 564(b)(1) of the Act, 21 U.S.C.section 360bbb-3(b)(1), unless the authorization is terminated  or revoked sooner.       Influenza A by PCR NEGATIVE NEGATIVE Final   Influenza B by PCR NEGATIVE NEGATIVE Final     Comment: (NOTE) The Xpert Xpress SARS-CoV-2/FLU/RSV plus assay is intended as an aid in the diagnosis of influenza from Nasopharyngeal swab specimens and should not be used as a sole basis for treatment. Nasal washings and aspirates are unacceptable for Xpert Xpress SARS-CoV-2/FLU/RSV testing.  Fact Sheet for Patients: BloggerCourse.com  Fact Sheet for Healthcare Providers: SeriousBroker.it  This test is not yet approved or cleared by the [place] FDA and has been authorized for detection and/or diagnosis of SARS-CoV-2 by FDA under an Emergency Use Authorization (EUA). This EUA will remain in effect (meaning this test can be used) for the duration of the COVID-19 declaration under Section 564(b)(1) of the Act, 21 U.S.C. section 360bbb-3(b)(1), unless the authorization is terminated or revoked.  Performed at [facility], [address], [place], [place] [address]     RADIOLOGY STUDIES/RESULTS: No results found.   LOS: 2 days   Jeoffrey Massed, MD  [facility]    To contact the attending provider between 7A-7P or the covering provider during after hours 7P-7A, please log into the web site [email or web address] and access using universal [place] password for that web site. If you do not have the password, please call the hospital operator.  03/23/2021, 12:22 PM

## 2021-03-23 NOTE — Progress Notes (Signed)
Physical Therapy Treatment Patient Details Name: Nicole [name] MRN: [id number] DOB: 01/06/1930 Today's Date: 03/23/2021    History of Present Illness Patient is a 87 y.o. female-who presented on 03/20/21 with severe left hip pain-further evaluation left iliopsoas tear/avulsion. Nonoperative management, WBAT, limit hip flexion to 90 degrees. She lives upstairs and gets down the stairs by sitting down and bumping down on her own. She denies any antecedent event. History of CVA with residual left-sided weakness, HTN, hypothyroidism    PT Comments    Pt making good progress today. She was able to take a few steps but fatigued easily.   Had assist of 2 for safety with ambulation.  She is very motivated and tolerating therapy well.  Continue plan of care.    Follow Up Recommendations  SNF;Supervision for mobility/OOB     Equipment Recommendations  None recommended by PT    Recommendations for Other Services       Precautions / Restrictions Precautions Precautions: Fall Precaution Comments: WBAT, limit hip flexion to 90 degrees. Required Braces or Orthoses: Other Brace (pt reports she wears a L knee brace at baseline, not in room at this time) Restrictions Weight Bearing Restrictions: No LLE Weight Bearing: Weight bearing as tolerated    Mobility  Bed Mobility Overal bed mobility: Needs Assistance Bed Mobility: Supine to Sit Rolling: Min guard Sidelying to sit: Min guard Supine to sit: Min assist Sit to supine: Mod assist   General bed mobility comments: Min A for L LE to sit, cues to scoot to edge    Transfers Overall transfer level: Needs assistance Equipment used: Rolling walker (2 wheeled) Transfers: Sit to/from Stand Sit to Stand: +2 safety/equipment;Min assist Stand pivot transfers: Max assist;+2 physical assistance       General transfer comment: Cues for hand placement and min A to rise  Ambulation/Gait Ambulation/Gait assistance: Min assist;Mod assist;+2  safety/equipment Gait Distance (Feet): 3 Feet Assistive device: Rolling walker (2 wheeled) Gait Pattern/deviations: Step-to pattern;Decreased weight shift to left;Decreased stance time - left;Decreased stride length Gait velocity: decreased   General Gait Details: Pt initially min A for a couple steps to chair but quickly fatigued with L knee buckling requiring mod A during L stance; cues for RW use   Stairs             Wheelchair Mobility    Modified Rankin (Stroke Patients Only)       Balance Overall balance assessment: Needs assistance Sitting-balance support: No upper extremity supported;Feet supported Sitting balance-Leahy Scale: Good     Standing balance support: Bilateral upper extremity supported Standing balance-Leahy Scale: Poor Standing balance comment: requiring RW                            Cognition Arousal/Alertness: Awake/alert Behavior During Therapy: WFL for tasks assessed/performed Overall Cognitive Status: No family/caregiver present to determine baseline cognitive functioning                                 General Comments: Pt very pleasant for therapy, follows all commands; motivated.  Possibly some mild confusion (differing reports on PLOF and laughs at times with questions).  Noted at eval report that pt has limited ambulation and aide that assist.  Today, pt reports that she went to the gym and walked laps but also reports she scoots up on her buttock for stairs.  Exercises General Exercises - Lower Extremity Ankle Circles/Pumps: AROM;Both;10 reps;Seated Quad Sets: AROM;Both;10 reps;Supine Gluteal Sets: AROM;Both;10 reps;Supine Long Arc Quad: AROM;Right;AAROM;Left;Seated (10x2) Hip ABduction/ADduction: AROM;Right;AAROM;Left;10 reps;Supine Hip Flexion/Marching: AROM;Right;10 reps;Seated    General Comments General comments (skin integrity, edema, etc.): VSS      Pertinent Vitals/Pain Pain Assessment:  Faces Faces Pain Scale: Hurts a little bit Pain Location: L hip Pain Descriptors / Indicators: Aching;Grimacing Pain Intervention(s): Limited activity within patient's tolerance;Monitored during session    Home Living                      Prior Function            PT Goals (current goals can now be found in the care plan section) Acute Rehab PT Goals Patient Stated Goal: return to walking PT Goal Formulation: With patient Time For Goal Achievement: 04/04/21 Potential to Achieve Goals: Good Progress towards PT goals: Progressing toward goals    Frequency    Min 3X/week      PT Plan Current plan remains appropriate    Co-evaluation              AM-PAC PT "6 Clicks" Mobility   Outcome Measure  Help needed turning from your back to your side while in a flat bed without using bedrails?: A Little Help needed moving from lying on your back to sitting on the side of a flat bed without using bedrails?: A Little Help needed moving to and from a bed to a chair (including a wheelchair)?: A Lot Help needed standing up from a chair using your arms (e.g., wheelchair or bedside chair)?: A Little Help needed to walk in hospital room?: A Lot Help needed climbing 3-5 steps with a railing? : Total 6 Click Score: 14    End of Session Equipment Utilized During Treatment: Gait belt Activity Tolerance: Patient tolerated treatment well Patient left: in chair;with call bell/phone within reach;with chair alarm set Nurse Communication: Mobility status (updated white board) PT Visit Diagnosis: Unsteadiness on feet (R26.81);Muscle weakness (generalized) (M62.81);Difficulty in walking, not elsewhere classified (R26.2);Pain Pain - Right/Left: Left Pain - part of body: Hip     Time: 1650-1715 PT Time Calculation (min) (ACUTE ONLY): 25 min  Charges:  $Gait Training: 8-22 mins $Therapeutic Exercise: 8-22 mins                     Anise Salvo, PT Acute Rehab Services Pager  [phone] [facility] [phone]     Rayetta Humphrey 03/23/2021, 6:05 PM

## 2021-03-24 DIAGNOSIS — M25559 Pain in unspecified hip: Secondary | ICD-10-CM | POA: Diagnosis not present

## 2021-03-24 NOTE — Social Work (Signed)
Provided current SNF offers to pt's dtr [name] and she is considering offer from [facility] but would like a decision from [place] before confirming. Whitney at [facility] aware of referral and is awaiting input from their DON. Nav[employer]/BCBS auth submitted and pending. Will provide updates as available.   Dellie Burns, MSW, LCSW [phone] (coverage)

## 2021-03-24 NOTE — Progress Notes (Signed)
Pt was seen for transfer to bed with RW and mod assist to control balance.  Her use of LLE is hindered only mildly from hip pain, but is more so concerning due to limitations for the injury.   Have received permission from MD for pt to have active and passive ex with limit of 90 deg flexion on L hip.  Follow otherwise for goals of acute PT.  03/24/21 1200  PT Visit Information  Last PT Received On 03/24/21  Assistance Needed +2  History of Present Illness 87 y.o. female-who presented on 03/20/21 with severe left hip pain-further evaluation left iliopsoas tear/avulsion. Nonoperative management, WBAT, limit hip flexion to 90 degrees. She lives upstairs and gets down the stairs by sitting down and bumping down on her own. She denies any antecedent event. History of CVA with residual left-sided weakness, HTN, hypothyroidism  Subjective Data  Subjective hip is 7/10 after walk  Patient Stated Goal get back to bed  Precautions  Precautions Fall  Precaution Comments WBAT, limit hip flexion to 90 degrees.  Required Braces or Orthoses Other Brace (L AFO not at hosp)  Restrictions  Weight Bearing Restrictions No  LLE Weight Bearing WBAT  Other Position/Activity Restrictions active and passive L hip exercise permitted with no flex past 90 on hip  Pain Assessment  Pain Assessment 0-10  Pain Score 7  Pain Location L hip  Pain Descriptors / Indicators Grimacing;Guarding  Pain Intervention(s) Limited activity within patient's tolerance;Monitored during session;Premedicated before session;Repositioned;Patient requesting pain meds-RN notified  Cognition  Arousal/Alertness Awake/alert  Behavior During Therapy WFL for tasks assessed/performed  Overall Cognitive Status No family/caregiver present to determine baseline cognitive functioning  General Comments has memory issues re preceding event to L hip injury  Bed Mobility  Overal bed mobility Needs Assistance  Bed Mobility Sit to Supine  Sit to supine Mod  assist  General bed mobility comments mod assist to support legs onto bed  Transfers  Overall transfer level Needs assistance  Transfers Sit to/from Stand  Sit to Stand Mod assist  Stand pivot transfers Mod assist  General transfer comment mod assist but had been up in chair  Ambulation/Gait  Ambulation/Gait assistance Min assist;Mod assist  Gait Distance (Feet) 4 Feet  Assistive device Rolling walker (2 wheeled)  Gait Pattern/deviations Step-to pattern;Decreased weight shift to left;Decreased stance time - left;Decreased stride length  General Gait Details min assist to support gait and pt is helping to move walker but completion is mod assist to finish shifting walker  Gait velocity decreased  Gait velocity interpretation <1.8 ft/sec, indicate of risk for recurrent falls  Balance  Overall balance assessment Needs assistance  Sitting-balance support Feet supported  Sitting balance-Leahy Scale Good  Standing balance-Leahy Scale Poor  Standing balance comment UE support on RW  General Comments  General comments (skin integrity, edema, etc.) pt was up in chair and fatigued, assisted to bed with mod assist and support for LLE weakness.  Exercises  Exercises General Lower Extremity  General Exercises - Lower Extremity  Ankle Circles/Pumps AAROM;5 reps  Long Arc Quad AROM;10 reps  Hip ABduction/ADduction AAROM;10 reps  Heel Slides AROM;10 reps  PT - End of Session  Equipment Utilized During Treatment Gait belt  Activity Tolerance Patient tolerated treatment well  Patient left in bed;with call bell/phone within reach;with bed alarm set  Nurse Communication Mobility status   PT - Assessment/Plan  PT Plan Current plan remains appropriate  PT Visit Diagnosis Unsteadiness on feet (R26.81);Muscle weakness (generalized) (M62.81);Difficulty in  walking, not elsewhere classified (R26.2);Pain  Pain - Right/Left Left  Pain - part of body Hip  PT Frequency (ACUTE ONLY) Min 3X/week  Follow Up  Recommendations SNF  PT equipment None recommended by PT  AM-PAC PT "6 Clicks" Mobility Outcome Measure (Version 2)  Help needed turning from your back to your side while in a flat bed without using bedrails? 3  Help needed moving from lying on your back to sitting on the side of a flat bed without using bedrails? 3  Help needed moving to and from a bed to a chair (including a wheelchair)? 3  Help needed standing up from a chair using your arms (e.g., wheelchair or bedside chair)? 2  Help needed to walk in hospital room? 2  Help needed climbing 3-5 steps with a railing?  1  6 Click Score 14  Consider Recommendation of Discharge To: CIR/SNF/LTACH  PT Goal Progression  Progress towards PT goals Progressing toward goals  PT Time Calculation  PT Start Time (ACUTE ONLY) 1143  PT Stop Time (ACUTE ONLY) 1211  PT Time Calculation (min) (ACUTE ONLY) 28 min  PT General Charges  $$ ACUTE PT VISIT 1 Visit  PT Treatments  $Gait Training 8-22 mins  $Therapeutic Exercise 8-22 mins    Samul Dada, PT MS Acute Rehab Dept. Number: [facility] [phone] and [facility] [phone]

## 2021-03-24 NOTE — Social Work (Signed)
Call received from Navi/BCBS pre-service coordinator who reports pt will be approved for SNF once facility chosen. Navi ref # [id number]. Following NCM updated.   Dellie Burns, MSW, LCSW [phone] (coverage)

## 2021-03-24 NOTE — Plan of Care (Signed)

## 2021-03-24 NOTE — Progress Notes (Signed)
PROGRESS NOTE        PATIENT DETAILS Name: Nicole [name] Age: 87 y.o. Sex: female Date of Birth: [birth year] Admit Date: 03/20/2021 Admitting Physician John Giovanni, MD [id number], Gerlene Burdock, MD  Brief Narrative: Patient is a 87 y.o. female history of CVA with residual left-sided weakness, HTN, hypothyroidism-who presented with severe left hip pain-further evaluation left iliopsoas tear/avulsion.  Significant events: 5/16>> admit for evaluation of left hip pain-found to have left iliopsoas tear.  No fractures.  Significant studies: 5/16 >>CT angio aorta/bifemoral: No high-grade stenosis/embolus/occlusion, sigmoid diverticulosis. 5/16>> x-ray left hip: No acute findings. 5/16>> x-ray left knee: No acute abnormalities. 5/16>> MRI left hip: Acute full-thickness tear of distal iliopsoas tendon.  No evidence of fracture  Antimicrobial therapy: None  Microbiology data: 5/17>> influenza/COVID PCR: Negative  Procedures : None  Consults: Orthopedics  DVT Prophylaxis : enoxaparin (LOVENOX) injection 40 mg Start: 03/21/21 1200 SCDs Start: 03/21/21 0218   Subjective: Lying comfortably in bed-had a BM yesterday.  Pain seems to be controlled on scheduled Tylenol.  Assessment/Plan: Acute full-thickness tear of the distal left iliopsoas tendon: Evaluated by orthopedics-recommendations are for nonoperative management, WBAT, limit hip flexion to 90 degrees.  Pain control much better-no longer requiring narcotics-continue scheduled Tylenol for a few more days.  Social work following for potential discharge to SNF over the weekend.    HTN: BP stable-continue amlodipine/HCTZ and lisinopril.  Hypothyroidism: Continue Synthroid  History of CVA with mild left-sided weakness: Continue Plavix/statin.  Diet: Diet Order            Diet Heart Room service appropriate? Yes; Fluid consistency: Thin  Diet effective now                  Code Status: Full code    Family Communication: Daughter -[name]-[phone] over the phone on 5/19   Disposition Plan: Status is: Inpatient.  The patient will require care spanning > 2 midnights and should be moved to inpatient because: Inpatient level of care appropriate due to severity of illness  Dispo: The patient is from: Home              Anticipated d/c is to: SNF              Patient currently is  medically stable to d/c.   Difficult to place patient No    Barriers to Discharge: awaiting SNF  Antimicrobial agents: Anti-infectives (From admission, onward)   None       Time spent: 15 minutes-Greater than 50% of this time was spent in counseling, explanation of diagnosis, planning of further management, and coordination of care.  MEDICATIONS: Scheduled Meds: . acetaminophen  1,000 mg Oral Q8H  . amLODipine  10 mg Oral Daily  . atorvastatin  80 mg Oral Daily  . bisacodyl  10 mg Rectal Once  . clopidogrel  75 mg Oral Daily  . enoxaparin (LOVENOX) injection  40 mg Subcutaneous Q24H  . hydrochlorothiazide  25 mg Oral Daily  . levothyroxine  50 mcg Oral Q0600  . lisinopril  20 mg Oral Daily  . loratadine  10 mg Oral Daily  . oxybutynin  5 mg Oral Daily  . pantoprazole  40 mg Oral Daily  . polyethylene glycol  17 g Oral Daily  . senna-docusate  2 tablet Oral QHS   Continuous Infusions: PRN Meds:.albuterol, baclofen, HYDROmorphone (DILAUDID) injection, ondansetron (  ZOFRAN) IV, oxyCODONE   PHYSICAL EXAM: Vital signs: Vitals:   03/23/21 1940 03/24/21 0521 03/24/21 0741 03/24/21 0749  BP: (!) 111/56 114/68  (!) 147/60  Pulse: 76 75  73  Resp: 14 16 18 17   Temp: 98.4 F (36.9 C) 97.6 F (36.4 C)  97.8 F (36.6 C)  TempSrc: Oral Oral  Oral  SpO2: 97% 95%  95%  Weight:      Height:       Filed Weights   03/20/21 1117  Weight: 61.2 kg   Body mass index is 23.17 kg/m.   Gen Exam:Alert awake-not in any distress HEENT:atraumatic, normocephalic Chest: B/L clear to  auscultation anteriorly CVS:S1S2 regular Abdomen:soft non tender, non distended Extremities:no edema Neurology: Non focal Skin: no rash  I have personally reviewed following labs and imaging studies  LABORATORY DATA: CBC: Recent Labs  Lab 03/20/21 1219  WBC 7.3  NEUTROABS 4.5  HGB 12.9  HCT 40.8  MCV 98.1  PLT 220    Basic Metabolic Panel: Recent Labs  Lab 03/20/21 1219  NA 139  K 4.2  CL 105  CO2 27  GLUCOSE 97  BUN 14  CREATININE 0.79  CALCIUM 9.3    GFR: Estimated Creatinine Clearance: 42 mL/min (by C-G formula based on SCr of 0.79 mg/dL).  Liver Function Tests: No results for input(s): AST, ALT, ALKPHOS, BILITOT, PROT, ALBUMIN in the last 168 hours. No results for input(s): LIPASE, AMYLASE in the last 168 hours. No results for input(s): AMMONIA in the last 168 hours.  Coagulation Profile: No results for input(s): INR, PROTIME in the last 168 hours.  Cardiac Enzymes: No results for input(s): CKTOTAL, CKMB, CKMBINDEX, TROPONINI in the last 168 hours.  BNP (last 3 results) No results for input(s): PROBNP in the last 8760 hours.  Lipid Profile: No results for input(s): CHOL, HDL, LDLCALC, TRIG, CHOLHDL, LDLDIRECT in the last 72 hours.  Thyroid Function Tests: No results for input(s): TSH, T4TOTAL, FREET4, T3FREE, THYROIDAB in the last 72 hours.  Anemia Panel: No results for input(s): VITAMINB12, FOLATE, FERRITIN, TIBC, IRON, RETICCTPCT in the last 72 hours.  Urine analysis:    Component Value Date/Time   COLORURINE STRAW (A) 07/21/2020 0228   APPEARANCEUR CLEAR 07/21/2020 0228   LABSPEC 1.012 07/21/2020 0228   PHURINE 8.0 07/21/2020 0228   GLUCOSEU NEGATIVE 07/21/2020 0228   HGBUR NEGATIVE 07/21/2020 0228   BILIRUBINUR NEGATIVE 07/21/2020 0228   KETONESUR NEGATIVE 07/21/2020 0228   PROTEINUR NEGATIVE 07/21/2020 0228   NITRITE NEGATIVE 07/21/2020 0228   LEUKOCYTESUR NEGATIVE 07/21/2020 0228    Sepsis Labs: Lactic Acid, Venous No results  found for: LATICACIDVEN  MICROBIOLOGY: Recent Results (from the past 240 hour(s))  Resp Panel by RT-PCR (Flu A&B, Covid) Nasopharyngeal Swab     Status: None   Collection Time: 03/21/21 12:57 AM   Specimen: Nasopharyngeal Swab; Nasopharyngeal(NP) swabs in vial transport medium  Result Value Ref Range Status   SARS Coronavirus 2 by RT PCR NEGATIVE NEGATIVE Final    Comment: (NOTE) SARS-CoV-2 target nucleic acids are NOT DETECTED.  The SARS-CoV-2 RNA is generally detectable in upper respiratory specimens during the acute phase of infection. The lowest concentration of SARS-CoV-2 viral copies this assay can detect is 138 copies/mL. A negative result does not preclude SARS-Cov-2 infection and should not be used as the sole basis for treatment or other patient management decisions. A negative result may occur with  improper specimen collection/handling, submission of specimen other than nasopharyngeal swab, presence of viral mutation(s)  within the areas targeted by this assay, and inadequate number of viral copies(<138 copies/mL). A negative result must be combined with clinical observations, patient history, and epidemiological information. The expected result is Negative.  Fact Sheet for Patients:  BloggerCourse.com  Fact Sheet for Healthcare Providers:  SeriousBroker.it  This test is no t yet approved or cleared by the [place] FDA and  has been authorized for detection and/or diagnosis of SARS-CoV-2 by FDA under an Emergency Use Authorization (EUA). This EUA will remain  in effect (meaning this test can be used) for the duration of the COVID-19 declaration under Section 564(b)(1) of the Act, 21 U.S.C.section 360bbb-3(b)(1), unless the authorization is terminated  or revoked sooner.       Influenza A by PCR NEGATIVE NEGATIVE Final   Influenza B by PCR NEGATIVE NEGATIVE Final    Comment: (NOTE) The Xpert Xpress  SARS-CoV-2/FLU/RSV plus assay is intended as an aid in the diagnosis of influenza from Nasopharyngeal swab specimens and should not be used as a sole basis for treatment. Nasal washings and aspirates are unacceptable for Xpert Xpress SARS-CoV-2/FLU/RSV testing.  Fact Sheet for Patients: BloggerCourse.com  Fact Sheet for Healthcare Providers: SeriousBroker.it  This test is not yet approved or cleared by the [place] FDA and has been authorized for detection and/or diagnosis of SARS-CoV-2 by FDA under an Emergency Use Authorization (EUA). This EUA will remain in effect (meaning this test can be used) for the duration of the COVID-19 declaration under Section 564(b)(1) of the Act, 21 U.S.C. section 360bbb-3(b)(1), unless the authorization is terminated or revoked.  Performed at [facility], [address] [place], [place] [address]     RADIOLOGY STUDIES/RESULTS: No results found.   LOS: 3 days   Jeoffrey Massed, MD  [facility]    To contact the attending provider between 7A-7P or the covering provider during after hours 7P-7A, please log into the web site [email or web address] and access using universal [place] password for that web site. If you do not have the password, please call the hospital operator.  03/24/2021, 12:11 PM

## 2021-03-24 NOTE — TOC Progression Note (Addendum)
Transition of Care [facility]) - Progression Note    Patient Details  Name: Nicole [name] MRN: [id number] Date of Birth: Mar 05, 1930  Transition of Care [facility]) CM/SW Contact  Epifanio Lesches, RN Phone Number: 03/24/2021, 11:34 AM  Clinical Narrative:    [facility] reviewing for acceptances. Insurance authorization pending... TOC team will continue to monitor and assist with needs....  03/24/2021 @1154  NCM received call from . [facility] stated they are unable to accept pt.  [phone] NCM received call from [facility] admission liaison/Ellen ( [phone]). [phone] stated they can accept pt ,however,bed will not be available until Tuesday. NCM stated pt medically ready for d/c can't wait until Tuesday to d/c. Wednesday stated she is going to call sister facility [facility] and see if they can accept pt now and pt transfer to [facility] on Tuesday.TuesdayMarland KitchenMarland KitchenMarland Kitchen to f/u with NCM.  Expected Discharge Plan: Skilled Nursing Facility Barriers to Discharge: Insurance Authorization,No SNF bed  Expected Discharge Plan and Services Expected Discharge Plan: Skilled Nursing Facility   Discharge Planning Services: CM Consult   Living arrangements for the past 2 months: Single Family Home         Social Determinants of Health (SDOH) Interventions    Readmission Risk Interventions No flowsheet data found.

## 2021-03-24 NOTE — Progress Notes (Signed)
Bedside shift report completed. Received patient awake,alert/orientedx4 and able to verbalize needs. NAD noted; respirations easy on room air. Movement/sensation noted to all extremities.SCDs on to bilateral lower extremities. Whiteboard updated. All safety measures in place and personal belongings within reach.  

## 2021-03-25 DIAGNOSIS — M25559 Pain in unspecified hip: Secondary | ICD-10-CM | POA: Diagnosis not present

## 2021-03-25 MED ORDER — OXYCODONE HCL 5 MG PO TABS: 5.0000 mg | ORAL_TABLET | Freq: Four times a day (QID) | ORAL | 0 refills | Status: AC | PRN

## 2021-03-25 NOTE — Plan of Care (Signed)
  Problem: Education: Goal: Knowledge of General Education information will improve Description: Including pain rating scale, medication(s)/side effects and non-pharmacologic comfort measures Outcome: Progressing   Problem: Health Behavior/Discharge Planning: Goal: Ability to manage health-related needs will improve Outcome: Progressing   Problem: Clinical Measurements: Goal: Ability to maintain clinical measurements within normal limits will improve Outcome: Progressing Goal: Will remain free from infection Outcome: Progressing Goal: Diagnostic test results will improve Outcome: Progressing Goal: Respiratory complications will improve Outcome: Progressing Goal: Cardiovascular complication will be avoided Outcome: Progressing   Problem: Nutrition: Goal: Adequate nutrition will be maintained Outcome: Progressing   Problem: Coping: Goal: Level of anxiety will decrease Outcome: Progressing   Problem: Elimination: Goal: Will not experience complications related to bowel motility Outcome: Progressing   

## 2021-03-25 NOTE — Progress Notes (Signed)
PROGRESS NOTE    Nicole [name]  XJD:[id number] DOB: 06-01-1930 DOA: 03/20/2021 PCP: Geoffry Paradise, MD    Brief Narrative:  87 year old female with history of stroke and right-sided hemiparesis, hypertension hypothyroidism presented to the emergency room with severe left hip pain without trauma, subsequently found to have left iliopsoas avulsion incomplete tear at the insertion to lesser trochanter.  Was painful and could not bear weight. Seen by orthopedics and recommended conservative management, now working with therapies.  Waiting to go to a skilled nursing rehab.   Assessment & Plan:   Principal Problem:   Hip pain Active Problems:   HTN (hypertension)   Hypothyroidism   History of CVA (cerebrovascular accident)  Left-sided hip pain secondary to rupture of iliopsoas tendon at insertion into left lesser trochanter: Evaluated by orthopedics.  Weightbearing as tolerated, no more than 90 degree flexion at left hip.  Will need ongoing PT OT.  Transfer to skilled nursing facility when bed available. Adequate pain medications to improve mobility, intermittently using low-dose oxycodone. Outpatient follow-up with orthopedics.  Essential hypertension: Blood pressure stable.  Continue current dose of amlodipine/hydrochlorothiazide and lisinopril.  Hypothyroidism: Continue Synthroid that she is taking.  History of CVA with left hemiparesis: On Plavix and statin.  No new neurological deficit.   DVT prophylaxis: enoxaparin (LOVENOX) injection 40 mg Start: 03/21/21 1200 SCDs Start: 03/21/21 0218   Code Status: Full code Family Communication: Patient's daughter on the phone Disposition Plan: Status is: Inpatient  Remains inpatient appropriate because:Unsafe d/c plan   Dispo: The patient is from: Home              Anticipated d/c is to: SNF              Patient currently is medically stable to d/c.   Difficult to place patient No         Consultants:    Orthopedics  Procedures:   None  Antimicrobials:   None   Subjective: Patient seen and examined.  No overnight events.  She tells me she has some left anterior hip pain when she tries to move.  It hurts even when she coughs.  She is not confident on weightbearing and walking.  No family at the bedside.  I called and discussed with patient's daughter.  Objective: Vitals:   03/24/21 2019 03/25/21 0431 03/25/21 0721 03/25/21 1248  BP: (!) 127/51 132/62 (!) 129/59 (!) 103/54  Pulse: 69 84 85 66  Resp: 16 17 17 16   Temp: 97.9 F (36.6 C) 97.9 F (36.6 C) 97.8 F (36.6 C) 97.7 F (36.5 C)  TempSrc: Oral Oral Oral Oral  SpO2: 96% 100% 96% 94%  Weight:      Height:        Intake/Output Summary (Last 24 hours) at 03/25/2021 1405 Last data filed at 03/25/2021 1004 Gross per 24 hour  Intake --  Output 351 ml  Net -351 ml   Filed Weights   03/20/21 1117  Weight: 61.2 kg    Examination:  General exam: Appears calm and comfortable  Frail and debilitated.  Not in any distress. Respiratory system: Clear to auscultation. Respiratory effort normal. Cardiovascular system: S1 & S2 heard, RRR.  Gastrointestinal system: Soft nontender.  Bowel sounds present. Central nervous system: Alert and oriented. No focal neurological deficits. Extremities: Symmetric 5 x 5 power.   Data Reviewed: I have personally reviewed following labs and imaging studies  CBC: Recent Labs  Lab 03/20/21 1219  WBC 7.3  NEUTROABS 4.5  HGB 12.9  HCT 40.8  MCV 98.1  PLT 220   Basic Metabolic Panel: Recent Labs  Lab 03/20/21 1219  NA 139  K 4.2  CL 105  CO2 27  GLUCOSE 97  BUN 14  CREATININE 0.79  CALCIUM 9.3   GFR: Estimated Creatinine Clearance: 42 mL/min (by C-G formula based on SCr of 0.79 mg/dL). Liver Function Tests: No results for input(s): AST, ALT, ALKPHOS, BILITOT, PROT, ALBUMIN in the last 168 hours. No results for input(s): LIPASE, AMYLASE in the last 168 hours. No  results for input(s): AMMONIA in the last 168 hours. Coagulation Profile: No results for input(s): INR, PROTIME in the last 168 hours. Cardiac Enzymes: No results for input(s): CKTOTAL, CKMB, CKMBINDEX, TROPONINI in the last 168 hours. BNP (last 3 results) No results for input(s): PROBNP in the last 8760 hours. HbA1C: No results for input(s): HGBA1C in the last 72 hours. CBG: No results for input(s): GLUCAP in the last 168 hours. Lipid Profile: No results for input(s): CHOL, HDL, LDLCALC, TRIG, CHOLHDL, LDLDIRECT in the last 72 hours. Thyroid Function Tests: No results for input(s): TSH, T4TOTAL, FREET4, T3FREE, THYROIDAB in the last 72 hours. Anemia Panel: No results for input(s): VITAMINB12, FOLATE, FERRITIN, TIBC, IRON, RETICCTPCT in the last 72 hours. Sepsis Labs: No results for input(s): PROCALCITON, LATICACIDVEN in the last 168 hours.  Recent Results (from the past 240 hour(s))  Resp Panel by RT-PCR (Flu A&B, Covid) Nasopharyngeal Swab     Status: None   Collection Time: 03/21/21 12:57 AM   Specimen: Nasopharyngeal Swab; Nasopharyngeal(NP) swabs in vial transport medium  Result Value Ref Range Status   SARS Coronavirus 2 by RT PCR NEGATIVE NEGATIVE Final    Comment: (NOTE) SARS-CoV-2 target nucleic acids are NOT DETECTED.  The SARS-CoV-2 RNA is generally detectable in upper respiratory specimens during the acute phase of infection. The lowest concentration of SARS-CoV-2 viral copies this assay can detect is 138 copies/mL. A negative result does not preclude SARS-Cov-2 infection and should not be used as the sole basis for treatment or other patient management decisions. A negative result may occur with  improper specimen collection/handling, submission of specimen other than nasopharyngeal swab, presence of viral mutation(s) within the areas targeted by this assay, and inadequate number of viral copies(<138 copies/mL). A negative result must be combined with clinical  observations, patient history, and epidemiological information. The expected result is Negative.  Fact Sheet for Patients:  BloggerCourse.com  Fact Sheet for Healthcare Providers:  SeriousBroker.it  This test is no t yet approved or cleared by the [place] FDA and  has been authorized for detection and/or diagnosis of SARS-CoV-2 by FDA under an Emergency Use Authorization (EUA). This EUA will remain  in effect (meaning this test can be used) for the duration of the COVID-19 declaration under Section 564(b)(1) of the Act, 21 U.S.C.section 360bbb-3(b)(1), unless the authorization is terminated  or revoked sooner.       Influenza A by PCR NEGATIVE NEGATIVE Final   Influenza B by PCR NEGATIVE NEGATIVE Final    Comment: (NOTE) The Xpert Xpress SARS-CoV-2/FLU/RSV plus assay is intended as an aid in the diagnosis of influenza from Nasopharyngeal swab specimens and should not be used as a sole basis for treatment. Nasal washings and aspirates are unacceptable for Xpert Xpress SARS-CoV-2/FLU/RSV testing.  Fact Sheet for Patients: BloggerCourse.com  Fact Sheet for Healthcare Providers: SeriousBroker.it  This test is not yet approved or cleared by the [place] and has been authorized for  detection and/or diagnosis of SARS-CoV-2 by FDA under an Emergency Use Authorization (EUA). This EUA will remain in effect (meaning this test can be used) for the duration of the COVID-19 declaration under Section 564(b)(1) of the Act, 21 U.S.C. section 360bbb-3(b)(1), unless the authorization is terminated or revoked.  Performed at [facility], [address], [place], [place] [address]          Radiology Studies: No results found.      Scheduled Meds: . acetaminophen  1,000 mg Oral Q8H  . amLODipine  10 mg Oral Daily  . atorvastatin  80 mg Oral Daily  . bisacodyl   10 mg Rectal Once  . clopidogrel  75 mg Oral Daily  . enoxaparin (LOVENOX) injection  40 mg Subcutaneous Q24H  . hydrochlorothiazide  25 mg Oral Daily  . levothyroxine  50 mcg Oral Q0600  . lisinopril  20 mg Oral Daily  . loratadine  10 mg Oral Daily  . oxybutynin  5 mg Oral Daily  . pantoprazole  40 mg Oral Daily  . polyethylene glycol  17 g Oral Daily  . senna-docusate  2 tablet Oral QHS   Continuous Infusions:   LOS: 4 days    Time spent: 32 minutes    Dorcas Carrow, MD [facility] Pager [phone]

## 2021-03-26 DIAGNOSIS — M25559 Pain in unspecified hip: Secondary | ICD-10-CM | POA: Diagnosis not present

## 2021-03-26 NOTE — Progress Notes (Signed)
PROGRESS NOTE    Nicole [name]  DEY:[id number] DOB: Apr 02, 1930 DOA: 03/20/2021 PCP: Geoffry Paradise, MD    Brief Narrative:  87 year old female with history of stroke and right-sided hemiparesis, hypertension hypothyroidism presented to the emergency room with severe left hip pain without trauma, subsequently found to have left iliopsoas avulsion incomplete tear at the insertion to lesser trochanter.  Was painful and could not bear weight. Seen by orthopedics and recommended conservative management, now working with therapies.  Waiting to go to a skilled nursing rehab.   Assessment & Plan:   Principal Problem:   Hip pain Active Problems:   HTN (hypertension)   Hypothyroidism   History of CVA (cerebrovascular accident)  Left-sided hip pain secondary to rupture of iliopsoas tendon at insertion into left lesser trochanter: Evaluated by orthopedics.  Weightbearing as tolerated, no more than 90 degree flexion at left hip.  Will need ongoing PT OT.  Transfer to skilled nursing facility when bed available. Adequate pain medications to improve mobility, intermittently using low-dose oxycodone. Outpatient follow-up with orthopedics.  Essential hypertension: Blood pressure stable.  Continue current dose of amlodipine/hydrochlorothiazide and lisinopril.  Hypothyroidism: Continue Synthroid that she is taking.  History of CVA with left hemiparesis: On Plavix and statin.  No new neurological deficit.   DVT prophylaxis: enoxaparin (LOVENOX) injection 40 mg Start: 03/21/21 1200 SCDs Start: 03/21/21 3149   Code Status: Full code Family Communication: Patient's daughter on the phone 5/21. Disposition Plan: Status is: Inpatient  Remains inpatient appropriate because:Unsafe d/c plan   Dispo: The patient is from: Home              Anticipated d/c is to: SNF              Patient currently is medically stable to d/c.   Difficult to place patient No         Consultants:    Orthopedics  Procedures:   None  Antimicrobials:   None   Subjective: Patient seen and examined.  No overnight events.  Hurts to move with left hip.  Objective: Vitals:   03/25/21 0721 03/25/21 1248 03/25/21 1952 03/26/21 0322  BP: (!) 129/59 (!) 103/54 (!) 130/55 (!) 190/62  Pulse: 85 66 70 75  Resp: 17 16 16 15   Temp: 97.8 F (36.6 C) 97.7 F (36.5 C) 97.8 F (36.6 C) 98 F (36.7 C)  TempSrc: Oral Oral Oral Oral  SpO2: 96% 94% 93% 95%  Weight:      Height:        Intake/Output Summary (Last 24 hours) at 03/26/2021 1334 Last data filed at 03/26/2021 0800 Gross per 24 hour  Intake 360 ml  Output --  Net 360 ml   Filed Weights   03/20/21 1117  Weight: 61.2 kg    Examination:  General exam: Appears calm and comfortable  Frail and debilitated.  Not in any distress. Respiratory system: Clear to auscultation. Respiratory effort normal. Cardiovascular system: S1 & S2 heard, RRR.  Gastrointestinal system: Soft nontender.  Bowel sounds present. Central nervous system: Alert and oriented. No focal neurological deficits. Extremities: Symmetric 5 x 5 power. Patient has tenderness along the anterolateral aspect of the left hip on mobility.   Data Reviewed: I have personally reviewed following labs and imaging studies  CBC: Recent Labs  Lab 03/20/21 1219  WBC 7.3  NEUTROABS 4.5  HGB 12.9  HCT 40.8  MCV 98.1  PLT 220   Basic Metabolic Panel: Recent Labs  Lab 03/20/21 1219  NA  139  K 4.2  CL 105  CO2 27  GLUCOSE 97  BUN 14  CREATININE 0.79  CALCIUM 9.3   GFR: Estimated Creatinine Clearance: 42 mL/min (by C-G formula based on SCr of 0.79 mg/dL). Liver Function Tests: No results for input(s): AST, ALT, ALKPHOS, BILITOT, PROT, ALBUMIN in the last 168 hours. No results for input(s): LIPASE, AMYLASE in the last 168 hours. No results for input(s): AMMONIA in the last 168 hours. Coagulation Profile: No results for input(s): INR, PROTIME in the last  168 hours. Cardiac Enzymes: No results for input(s): CKTOTAL, CKMB, CKMBINDEX, TROPONINI in the last 168 hours. BNP (last 3 results) No results for input(s): PROBNP in the last 8760 hours. HbA1C: No results for input(s): HGBA1C in the last 72 hours. CBG: No results for input(s): GLUCAP in the last 168 hours. Lipid Profile: No results for input(s): CHOL, HDL, LDLCALC, TRIG, CHOLHDL, LDLDIRECT in the last 72 hours. Thyroid Function Tests: No results for input(s): TSH, T4TOTAL, FREET4, T3FREE, THYROIDAB in the last 72 hours. Anemia Panel: No results for input(s): VITAMINB12, FOLATE, FERRITIN, TIBC, IRON, RETICCTPCT in the last 72 hours. Sepsis Labs: No results for input(s): PROCALCITON, LATICACIDVEN in the last 168 hours.  Recent Results (from the past 240 hour(s))  Resp Panel by RT-PCR (Flu A&B, Covid) Nasopharyngeal Swab     Status: None   Collection Time: 03/21/21 12:57 AM   Specimen: Nasopharyngeal Swab; Nasopharyngeal(NP) swabs in vial transport medium  Result Value Ref Range Status   SARS Coronavirus 2 by RT PCR NEGATIVE NEGATIVE Final    Comment: (NOTE) SARS-CoV-2 target nucleic acids are NOT DETECTED.  The SARS-CoV-2 RNA is generally detectable in upper respiratory specimens during the acute phase of infection. The lowest concentration of SARS-CoV-2 viral copies this assay can detect is 138 copies/mL. A negative result does not preclude SARS-Cov-2 infection and should not be used as the sole basis for treatment or other patient management decisions. A negative result may occur with  improper specimen collection/handling, submission of specimen other than nasopharyngeal swab, presence of viral mutation(s) within the areas targeted by this assay, and inadequate number of viral copies(<138 copies/mL). A negative result must be combined with clinical observations, patient history, and epidemiological information. The expected result is Negative.  Fact Sheet for Patients:   BloggerCourse.com  Fact Sheet for Healthcare Providers:  SeriousBroker.it  This test is no t yet approved or cleared by the [place] FDA and  has been authorized for detection and/or diagnosis of SARS-CoV-2 by FDA under an Emergency Use Authorization (EUA). This EUA will remain  in effect (meaning this test can be used) for the duration of the COVID-19 declaration under Section 564(b)(1) of the Act, 21 U.S.C.section 360bbb-3(b)(1), unless the authorization is terminated  or revoked sooner.       Influenza A by PCR NEGATIVE NEGATIVE Final   Influenza B by PCR NEGATIVE NEGATIVE Final    Comment: (NOTE) The Xpert Xpress SARS-CoV-2/FLU/RSV plus assay is intended as an aid in the diagnosis of influenza from Nasopharyngeal swab specimens and should not be used as a sole basis for treatment. Nasal washings and aspirates are unacceptable for Xpert Xpress SARS-CoV-2/FLU/RSV testing.  Fact Sheet for Patients: BloggerCourse.com  Fact Sheet for Healthcare Providers: SeriousBroker.it  This test is not yet approved or cleared by the [place] FDA and has been authorized for detection and/or diagnosis of SARS-CoV-2 by FDA under an Emergency Use Authorization (EUA). This EUA will remain in effect (meaning this test can be  used) for the duration of the COVID-19 declaration under Section 564(b)(1) of the Act, 21 U.S.C. section 360bbb-3(b)(1), unless the authorization is terminated or revoked.  Performed at [facility], [address], [place], [place] [address]          Radiology Studies: No results found.      Scheduled Meds: . acetaminophen  1,000 mg Oral Q8H  . amLODipine  10 mg Oral Daily  . atorvastatin  80 mg Oral Daily  . bisacodyl  10 mg Rectal Once  . clopidogrel  75 mg Oral Daily  . enoxaparin (LOVENOX) injection  40 mg Subcutaneous Q24H  .  hydrochlorothiazide  25 mg Oral Daily  . levothyroxine  50 mcg Oral Q0600  . lisinopril  20 mg Oral Daily  . loratadine  10 mg Oral Daily  . oxybutynin  5 mg Oral Daily  . pantoprazole  40 mg Oral Daily  . polyethylene glycol  17 g Oral Daily  . senna-docusate  2 tablet Oral QHS   Continuous Infusions:   LOS: 5 days    Time spent: 32 minutes    Dorcas Carrow, MD [facility] Pager [phone]

## 2021-03-27 DIAGNOSIS — M25559 Pain in unspecified hip: Secondary | ICD-10-CM | POA: Diagnosis not present

## 2021-03-27 NOTE — Plan of Care (Signed)
  Problem: Education: Goal: Knowledge of General Education information will improve Description Including pain rating scale, medication(s)/side effects and non-pharmacologic comfort measures Outcome: Progressing   Problem: Health Behavior/Discharge Planning: Goal: Ability to manage health-related needs will improve Outcome: Progressing   

## 2021-03-27 NOTE — Progress Notes (Signed)
PROGRESS NOTE    Nicole [name]  PVX:[id number] DOB: August 08, 1930 DOA: 03/20/2021 PCP: Geoffry Paradise, MD    Brief Narrative:  87 year old female with history of stroke and right-sided hemiparesis, hypertension hypothyroidism presented to the emergency room with severe left hip pain without trauma, subsequently found to have left iliopsoas avulsion incomplete tear at the insertion to lesser trochanter.  Was painful and could not bear weight. Seen by orthopedics and recommended conservative management, now working with therapies.  Waiting to go to a skilled nursing rehab.   Assessment & Plan:   Principal Problem:   Hip pain Active Problems:   HTN (hypertension)   Hypothyroidism   History of CVA (cerebrovascular accident)  Left-sided hip pain secondary to rupture of iliopsoas tendon at insertion into left lesser trochanter: Evaluated by orthopedics.  Weightbearing as tolerated, no more than 90 degree flexion at left hip.  Will need ongoing PT OT.  Transfer to skilled nursing facility when bed available. Adequate pain medications to improve mobility, intermittently using low-dose oxycodone. Outpatient follow-up with orthopedics.  Essential hypertension: Blood pressure stable.  Continue current dose of amlodipine/hydrochlorothiazide and lisinopril.  Hypothyroidism: Continue Synthroid that she is taking.  History of CVA with left hemiparesis: On Plavix and statin.  No new neurological deficit.   DVT prophylaxis: enoxaparin (LOVENOX) injection 40 mg Start: 03/21/21 1200 SCDs Start: 03/21/21 4827   Code Status: Full code Family Communication: Patient's daughter on the phone 5/21. Disposition Plan: Status is: Inpatient  Remains inpatient appropriate because:Unsafe d/c plan   Dispo: The patient is from: Home              Anticipated d/c is to: SNF              Patient currently is medically stable to d/c.   Difficult to place patient No         Consultants:    Orthopedics  Procedures:   None  Antimicrobials:   None   Subjective: No new events.  Hurts to move left hip.  Objective: Vitals:   03/26/21 1430 03/26/21 2002 03/27/21 0523 03/27/21 0732  BP: (!) 160/60 135/62 (!) 141/86 131/60  Pulse: 74 74 74 67  Resp: 18 16 18 16   Temp: 98.8 F (37.1 C) 98.1 F (36.7 C) 98.1 F (36.7 C) (!) 97.4 F (36.3 C)  TempSrc: Oral Oral Oral Oral  SpO2: 96% 95% 95% 98%  Weight:      Height:        Intake/Output Summary (Last 24 hours) at 03/27/2021 1232 Last data filed at 03/27/2021 1052 Gross per 24 hour  Intake 980 ml  Output 2 ml  Net 978 ml   Filed Weights   03/20/21 1117  Weight: 61.2 kg    Examination:  General exam: Appears calm and comfortable  Frail and debilitated.  Not in any distress. Respiratory system: Clear to auscultation. Respiratory effort normal. Cardiovascular system: S1 & S2 heard, RRR.  Gastrointestinal system: Soft nontender.  Bowel sounds present. Central nervous system: Alert and oriented. No focal neurological deficits. Extremities: Symmetric 5 x 5 power. Patient has tenderness along the anterolateral aspect of the left hip on mobility.   Data Reviewed: I have personally reviewed following labs and imaging studies  CBC: No results for input(s): WBC, NEUTROABS, HGB, HCT, MCV, PLT in the last 168 hours. Basic Metabolic Panel: No results for input(s): NA, K, CL, CO2, GLUCOSE, BUN, CREATININE, CALCIUM, MG, PHOS in the last 168 hours. GFR: Estimated Creatinine Clearance: 42 mL/min (  by C-G formula based on SCr of 0.79 mg/dL). Liver Function Tests: No results for input(s): AST, ALT, ALKPHOS, BILITOT, PROT, ALBUMIN in the last 168 hours. No results for input(s): LIPASE, AMYLASE in the last 168 hours. No results for input(s): AMMONIA in the last 168 hours. Coagulation Profile: No results for input(s): INR, PROTIME in the last 168 hours. Cardiac Enzymes: No results for input(s): CKTOTAL, CKMB,  CKMBINDEX, TROPONINI in the last 168 hours. BNP (last 3 results) No results for input(s): PROBNP in the last 8760 hours. HbA1C: No results for input(s): HGBA1C in the last 72 hours. CBG: No results for input(s): GLUCAP in the last 168 hours. Lipid Profile: No results for input(s): CHOL, HDL, LDLCALC, TRIG, CHOLHDL, LDLDIRECT in the last 72 hours. Thyroid Function Tests: No results for input(s): TSH, T4TOTAL, FREET4, T3FREE, THYROIDAB in the last 72 hours. Anemia Panel: No results for input(s): VITAMINB12, FOLATE, FERRITIN, TIBC, IRON, RETICCTPCT in the last 72 hours. Sepsis Labs: No results for input(s): PROCALCITON, LATICACIDVEN in the last 168 hours.  Recent Results (from the past 240 hour(s))  Resp Panel by RT-PCR (Flu A&B, Covid) Nasopharyngeal Swab     Status: None   Collection Time: 03/21/21 12:57 AM   Specimen: Nasopharyngeal Swab; Nasopharyngeal(NP) swabs in vial transport medium  Result Value Ref Range Status   SARS Coronavirus 2 by RT PCR NEGATIVE NEGATIVE Final    Comment: (NOTE) SARS-CoV-2 target nucleic acids are NOT DETECTED.  The SARS-CoV-2 RNA is generally detectable in upper respiratory specimens during the acute phase of infection. The lowest concentration of SARS-CoV-2 viral copies this assay can detect is 138 copies/mL. A negative result does not preclude SARS-Cov-2 infection and should not be used as the sole basis for treatment or other patient management decisions. A negative result may occur with  improper specimen collection/handling, submission of specimen other than nasopharyngeal swab, presence of viral mutation(s) within the areas targeted by this assay, and inadequate number of viral copies(<138 copies/mL). A negative result must be combined with clinical observations, patient history, and epidemiological information. The expected result is Negative.  Fact Sheet for Patients:  BloggerCourse.com  Fact Sheet for Healthcare  Providers:  SeriousBroker.it  This test is no t yet approved or cleared by the [place] FDA and  has been authorized for detection and/or diagnosis of SARS-CoV-2 by FDA under an Emergency Use Authorization (EUA). This EUA will remain  in effect (meaning this test can be used) for the duration of the COVID-19 declaration under Section 564(b)(1) of the Act, 21 U.S.C.section 360bbb-3(b)(1), unless the authorization is terminated  or revoked sooner.       Influenza A by PCR NEGATIVE NEGATIVE Final   Influenza B by PCR NEGATIVE NEGATIVE Final    Comment: (NOTE) The Xpert Xpress SARS-CoV-2/FLU/RSV plus assay is intended as an aid in the diagnosis of influenza from Nasopharyngeal swab specimens and should not be used as a sole basis for treatment. Nasal washings and aspirates are unacceptable for Xpert Xpress SARS-CoV-2/FLU/RSV testing.  Fact Sheet for Patients: BloggerCourse.com  Fact Sheet for Healthcare Providers: SeriousBroker.it  This test is not yet approved or cleared by the [place] FDA and has been authorized for detection and/or diagnosis of SARS-CoV-2 by FDA under an Emergency Use Authorization (EUA). This EUA will remain in effect (meaning this test can be used) for the duration of the COVID-19 declaration under Section 564(b)(1) of the Act, 21 U.S.C. section 360bbb-3(b)(1), unless the authorization is terminated or revoked.  Performed at [facility], [address] [place], [place] [address]          Radiology Studies: No results found.      Scheduled Meds: . acetaminophen  1,000 mg Oral Q8H  . amLODipine  10 mg Oral Daily  . atorvastatin  80 mg Oral Daily  . bisacodyl  10 mg Rectal Once  . clopidogrel  75 mg Oral Daily  . enoxaparin (LOVENOX) injection  40 mg Subcutaneous Q24H  . hydrochlorothiazide  25 mg Oral Daily  . levothyroxine  50 mcg Oral Q0600  .  lisinopril  20 mg Oral Daily  . loratadine  10 mg Oral Daily  . oxybutynin  5 mg Oral Daily  . pantoprazole  40 mg Oral Daily  . polyethylene glycol  17 g Oral Daily  . senna-docusate  2 tablet Oral QHS   Continuous Infusions:   LOS: 6 days    Time spent: 32 minutes    Dorcas Carrow, MD [facility] Pager [phone]

## 2021-03-27 NOTE — TOC Progression Note (Addendum)
Transition of Care [facility]) - Progression Note    Patient Details  Name: Nicole [name] MRN: [id number] Date of Birth: 09/17/1930  Transition of [facility] CM/SW Contact  Epifanio Lesches, RN Phone Number: 03/27/2021, 2:45 PM  Clinical Narrative:    NCM f/u with [facility] admission director in regard to bed offer. Per voice message left over the weekend, admission liaison stated bed will be available today.  NCM made Nav[employer]/ [employer] aware pt will d/c to [facility]. Insurance authorization pending...awaiting determination.  TOC team will continue to monitor and assist with needs....   Expected Discharge Plan: Skilled Nursing Facility Barriers to Discharge: Insurance Authorization  Expected Discharge Plan and Services Expected Discharge Plan: Skilled Nursing Facility   Discharge Planning Services: CM Consult   Living arrangements for the past 2 months: Single Family Home                                       Social Determinants of Health (SDOH) Interventions    Readmission Risk Interventions No flowsheet data found.

## 2021-03-27 NOTE — Care Management Important Message (Signed)
Important Message  Patient Details  Name: Nicole [name] MRN: [id number] Date of Birth: May 23, 1930   Medicare Important Message Given:  Yes     [name] [name] 03/27/2021, 2:24 PM

## 2021-03-27 NOTE — Progress Notes (Signed)
Physical Therapy Treatment Patient Details Name: Nicole [name] MRN: [id number] DOB: 05/02/1930 Today's Date: 03/27/2021    History of Present Illness 87 y.o. female-who presented on 03/20/21 with severe left hip pain-further evaluation left iliopsoas tear/avulsion. Nonoperative management, WBAT, limit hip flexion to 90 degrees. She lives upstairs and gets down the stairs by sitting down and bumping down on her own. She denies any antecedent event. History of CVA with residual left-sided weakness, HTN, hypothyroidism    PT Comments    Pt was seen for mobility on RW with issues of balance to manage her standing control with walker and careful hand placement.  Her follow through on standing and stepping is good, but is weak and requires help to stand on her L side for safety on walker.  Follow up with her to increase lle strength as tolerated and to avoid overstressing the joint.   Follow Up Recommendations  SNF     Equipment Recommendations  None recommended by PT    Recommendations for Other Services       Precautions / Restrictions Precautions Precautions: Fall Precaution Comments: WBAT, limit hip flexion to 90 degrees. Required Braces or Orthoses: Other Brace Other Brace: AFO Restrictions Weight Bearing Restrictions: No    Mobility  Bed Mobility Overal bed mobility: Needs Assistance Bed Mobility: Supine to Sit;Sit to Supine Rolling: Min guard   Supine to sit: Min assist Sit to supine: Mod assist   General bed mobility comments: mod to lifft legs onto bed    Transfers Overall transfer level: Needs assistance Equipment used: Rolling walker (2 wheeled) Transfers: Sit to/from Stand Sit to Stand: Mod assist         General transfer comment: mod to support balance which tends toward falling backward  Ambulation/Gait Ambulation/Gait assistance: Min assist;Mod assist Gait Distance (Feet): 16 Feet Assistive device: Rolling walker (2 wheeled) Gait Pattern/deviations:  Step-to pattern;Decreased weight shift to left;Decreased stance time - left;Decreased stride length Gait velocity: decreased Gait velocity interpretation: <1.31 ft/sec, indicative of household ambulator General Gait Details: min to step and mod to avoid losing balance backward   Optometrist    Modified Rankin (Stroke Patients Only)       Balance Overall balance assessment: Needs assistance Sitting-balance support: Feet supported Sitting balance-Leahy Scale: Good   Postural control: Posterior lean Standing balance support: Bilateral upper extremity supported;During functional activity Standing balance-Leahy Scale: Poor                              Cognition Arousal/Alertness: Awake/alert Behavior During Therapy: WFL for tasks assessed/performed Overall Cognitive Status: No family/caregiver present to determine baseline cognitive functioning                                        Exercises General Exercises - Lower Extremity Ankle Circles/Pumps: AAROM;5 reps Quad Sets: AROM;10 reps Gluteal Sets: AROM;10 reps Heel Slides: AAROM;10 reps Hip ABduction/ADduction: AAROM;10 reps Straight Leg Raises: AAROM;10 reps Hip Flexion/Marching: AAROM;10 reps    General Comments General comments (skin integrity, edema, etc.): pt is assisted to stand and step, instructed her to maneuver walker and stay close to bed as she sitting wihtout warning      Pertinent Vitals/Pain Pain Assessment: 0-10 Pain Score: 6  Pain Location: L hip Pain Descriptors /  Indicators: Guarding Pain Intervention(s): Limited activity within patient's tolerance;Premedicated before session;Repositioned    Home Living                      Prior Function            PT Goals (current goals can now be found in the care plan section) Acute Rehab PT Goals Patient Stated Goal: get back on her feet Progress towards PT goals: Progressing toward  goals    Frequency    Min 3X/week      PT Plan Current plan remains appropriate    Co-evaluation              AM-PAC PT "6 Clicks" Mobility   Outcome Measure  Help needed turning from your back to your side while in a flat bed without using bedrails?: A Little Help needed moving from lying on your back to sitting on the side of a flat bed without using bedrails?: A Little Help needed moving to and from a bed to a chair (including a wheelchair)?: A Little Help needed standing up from a chair using your arms (e.g., wheelchair or bedside chair)?: A Lot Help needed to walk in hospital room?: A Lot Help needed climbing 3-5 steps with a railing? : A Lot 6 Click Score: 15    End of Session   Activity Tolerance: Patient limited by fatigue;Patient limited by pain;Other (comment);Treatment limited secondary to medical complications (Comment) (ROM limited to 90 deg on L hip) Patient left: in bed;with call bell/phone within reach;with bed alarm set Nurse Communication: Mobility status PT Visit Diagnosis: Unsteadiness on feet (R26.81);Muscle weakness (generalized) (M62.81);Difficulty in walking, not elsewhere classified (R26.2);Pain Pain - Right/Left: Left Pain - part of body: Hip     Time: 5374-8270 PT Time Calculation (min) (ACUTE ONLY): 23 min  Charges:  $Gait Training: 8-22 mins $Therapeutic Exercise: 8-22 mins                 Ivar Drape 03/27/2021, 8:07 PM  Samul Dada, PT MS Acute Rehab Dept. Number: [facility] [phone] and [facility] [phone]

## 2021-03-28 DIAGNOSIS — M25559 Pain in unspecified hip: Secondary | ICD-10-CM | POA: Diagnosis not present

## 2021-03-28 NOTE — Discharge Summary (Signed)
Physician Discharge Summary  Nicole [name]:[id number] DOB: 05/28/1930 DOA: 03/20/2021  PCP: Geoffry Paradise, MD  Admit date: 03/20/2021 Discharge date: 03/28/2021  Admitted From: Home Disposition: Skilled nursing facility  Recommendations for Outpatient Follow-up:  1. Follow up with PCP in 1-2 weeks 2. Orthopedics will schedule follow-up  Home Health: Not applicable Equipment/Devices: Not applicable  Discharge Condition: Stable CODE STATUS: Full code Diet recommendation: Low-salt diet  Discharge summary: 87 year old female with history of stroke and left- sided hemiparesis, hypertension, hypothyroidism presented to the emergency room with severe left hip pain without trauma, subsequently found to have left iliopsoas avulsion and complete tear at the insertion to lesser trochanter.  Was painful and could not bear weight. Seen by orthopedics and recommended conservative management, now working with therapies.  Waiting to go to a skilled nursing rehab.   Assessment & plan of care:   # Left-sided hip pain secondary to rupture of iliopsoas tendon at insertion into left lesser trochanter: Evaluated by orthopedics.  Weightbearing as tolerated, no more than 90 degree flexion at left hip.  Will need ongoing PT OT.  Transfer to skilled nursing facility when bed available. Adequate pain medications to improve mobility, intermittently using low-dose oxycodone along with laxatives to avoid constipation. Outpatient follow-up with orthopedics.  # Essential hypertension: Blood pressure stable.  Continue current dose of amlodipine/hydrochlorothiazide and lisinopril.  # Hypothyroidism: Continue Synthroid that she is taking.  # History of CVA with left hemiparesis: On Plavix and statin.  No new neurological deficit.  Patient is medically stable to transfer to skilled level of care whenever bed is available.    Discharge Diagnoses:  Principal Problem:   Hip pain Active Problems:   HTN  (hypertension)   Hypothyroidism   History of CVA (cerebrovascular accident)    Discharge Instructions  Discharge Instructions    Diet - low sodium heart healthy   Complete by: As directed    Increase activity slowly   Complete by: As directed      Allergies as of 03/28/2021   No Known Allergies     Medication List    STOP taking these medications   olmesartan 20 MG tablet Commonly known as: BENICAR   Pfizer-BioNTech COVID-19 Vacc 30 MCG/0.3ML injection Generic drug: COVID-19 mRNA vaccine (Pfizer)     TAKE these medications   acetaminophen 325 MG tablet Commonly known as: TYLENOL Take 2 tablets (650 mg total) by mouth every 8 (eight) hours. What changed:   when to take this  reasons to take this   albuterol 108 (90 Base) MCG/ACT inhaler Commonly known as: VENTOLIN HFA INHALE 1 PUFF INTO THE LUNGS EVERY FOUR HOURS AS NEEDED FOR WHEEZING OR SHORTNESS OF BREATH. What changed:   how much to take  how to take this  when to take this  reasons to take this   amLODipine 10 MG tablet Commonly known as: NORVASC Take 10 mg by mouth daily.   atorvastatin 80 MG tablet Commonly known as: LIPITOR TAKE 1 TABLET (80 MG TOTAL) BY MOUTH DAILY. What changed: how much to take   baclofen 10 MG tablet Commonly known as: LIORESAL Take 1 tablet (10 mg total) by mouth 4 (four) times daily. Take 1 pill in AM and 1 in afternoon and 1.5 to 2 pills at night- for spasticity What changed:   how much to take  when to take this  additional instructions   Calcium + D3 600-800 MG-UNIT Tabs Take 1 tablet by mouth 2 (two) times daily.  cetirizine 10 MG tablet Commonly known as: ZYRTEC Take 1 tablet (10 mg total) by mouth daily as needed for allergies or rhinitis. What changed: when to take this   clopidogrel 75 MG tablet Commonly known as: PLAVIX Take 1 tablet (75 mg total) by mouth daily.   diclofenac Sodium 1 % Gel Commonly known as: VOLTAREN APPLY 2 G TOPICALLY FOUR  TIMES DAILY AS NEEDED TO AFFECTED AREAS- FOR PAIN. What changed:   how much to take  how to take this  when to take this  reasons to take this   hydrochlorothiazide 25 MG tablet Commonly known as: HYDRODIURIL Take 25 mg by mouth daily.   levothyroxine 50 MCG tablet Commonly known as: SYNTHROID Take 1 tablet (50 mcg total) by mouth daily before breakfast.   lisinopril 20 MG tablet Commonly known as: ZESTRIL TAKE 1 TABLET (20 MG TOTAL) BY MOUTH DAILY. What changed: how much to take   oxybutynin 5 MG 24 hr tablet Commonly known as: DITROPAN-XL Take 5 mg by mouth daily.   oxyCODONE 5 MG immediate release tablet Commonly known as: Oxy IR/ROXICODONE Take 1 tablet (5 mg total) by mouth every 6 (six) hours as needed for up to 5 days for moderate pain.   pantoprazole 40 MG tablet Commonly known as: PROTONIX Take 40 mg by mouth 2 (two) times daily as needed.   polyethylene glycol 17 g packet Commonly known as: MIRALAX / GLYCOLAX Take 17 g by mouth daily as needed. What changed: reasons to take this   vitamin B-12 250 MCG tablet Commonly known as: CYANOCOBALAMIN Take 1 tablet (250 mcg total) by mouth daily. What changed: Another medication with the same name was removed. Continue taking this medication, and follow the directions you see here.   Vitamin D3 25 MCG tablet Commonly known as: Vitamin D TAKE 1 TABLET (1,000 UNITS TOTAL) BY MOUTH DAILY.       Contact information for follow-up providers    Yolonda Kida, MD. Schedule an appointment as soon as possible for a visit in 3 week(s).   Specialty: Orthopedic Surgery Contact information: [address] [place] [address] [phone]            Contact information for after-discharge care    Destination    [facility] .   Service: Skilled Nursing Contact information: [address] [place] [address] [phone]                 No  Known Allergies  Consultations:  Orthopedics   Procedures/Studies: CT ANGIO AO+BIFEM W & OR WO CONTRAST  Result Date: 03/20/2021 CLINICAL DATA:  Leg pain, arterial embolism EXAM: CT ANGIOGRAPHY OF ABDOMINAL AORTA WITH ILIOFEMORAL RUNOFF TECHNIQUE: Multidetector CT imaging of the abdomen, pelvis and lower extremities was performed using the standard protocol during bolus administration of intravenous contrast. Multiplanar CT image reconstructions and MIPs were obtained to evaluate the vascular anatomy. CONTRAST:  OMNIPAQUE IOHEXOL 350 MG/ML SOLN COMPARISON:  None. FINDINGS: VASCULAR Aorta: Moderate calcified atheromatous plaque. No aneurysm, dissection, or stenosis. Celiac: Partially calcified ostial plaque resulting in short segment mild stenosis over length of less than 1 cm, patent distally with unremarkable distal branching. SMA: Calcified ostial plaque resulting in short-segment ostial stenosis less than 1 cm of at least mild severity, widely patent distally with classic distal branch anatomy. Renals: Single left, with partially calcified ostial plaque and short-segment stenosis of at least moderate severity, patent distally. Single right, with calcified ostial  plaque with only mild short-segment stenosis, patent distally. IMA: Patent without evidence of aneurysm, dissection, vasculitis or significant stenosis. RIGHT Lower Extremity Inflow: Common iliac scattered calcified atheromatous plaque Internal iliac atheromatous, patent External iliac minimally atheromatous, patent Outflow: Common femoral mildly atheromatous, patent Deep femoral branches patent. SFA scattered calcified plaque with tandem areas of mild stenosis. Popliteal calcified plaque proximally with short-segment mild stenosis, patent distally across the knee Runoff: There is poor contrast opacification of vessels below the distal calf but which may reflect early scan timing relative to the arterial contrast bolus. Anterior tibial  occludes distal calf level. Posterior tibial seen to above the ankle. Peroneal is diminutive, seen to just above the ankle. LEFT Lower Extremity Inflow: Common iliac scattered calcified plaque without high-grade stenosis, aneurysm or dissection. Internal iliac occlusion just beyond its origin. External iliac scattered plaque, no stenosis. Outflow: Common femoral mildly atheromatous, patent. Deep femoral branches patent. SFA scattered calcified plaque with mild stenosis distally Popliteal scattered calcified plaque with tandem mild stenoses above the knee, patent distally Runoff: Anterior and posterior tibial contiguous runoff across the ankle. Peroneal artery is diminutive, seen to the distal calf. Veins: No obvious venous abnormality within the limitations of this arterial phase study. Review of the MIP images confirms the above findings. NON-VASCULAR Lower chest: Trace bilateral pleural fluid. Hepatobiliary: No focal liver abnormality is seen. No gallstones, gallbladder wall thickening, or biliary dilatation. Pancreas: Unremarkable. No pancreatic ductal dilatation or surrounding inflammatory changes. Spleen: Normal in size without focal abnormality. Adrenals/Urinary Tract: Adrenal glands are unremarkable. Kidneys are normal, without renal calculi, focal lesion, or hydronephrosis. Bladder is unremarkable. Stomach/Bowel: Stomach decompressed. Small bowel nondilated, unremarkable. Appendix not discretely identified. Mild proximal colonic distension, decompressed distally with scattered sigmoid diverticula; no significant adjacent inflammatory/edematous change. Lymphatic: No abdominal or pelvic adenopathy. Reproductive: Uterus and bilateral adnexa are unremarkable. Other: No ascites.  No free air. Musculoskeletal: Spondylitic changes in the lower lumbar spine. Multilevel bilateral knee DJD involving primarily lateral compartments. No fracture or worrisome bone lesion. IMPRESSION: 1. Aortic atherosclerosis  (ICD10-170.0) with mild visceral and renal ostial stenoses. 2. Scattered bilateral lower extremity arterial inflow and outflow disease with no high-grade stenosis, embolus, or occlusion. 3. Limited assessment of distal right lower extremity tibial runoff due to scan timing. Cannot confidently exclude distal tibial occlusive or embolic disease. 4. Two-vessel left lower extremity tibial runoff. 5. Lower lumbar spondylitic change. 6. Sigmoid diverticulosis. Electronically Signed   By: Corlis Leak M.D.   On: 03/20/2021 15:48   MR HIP LEFT WO CONTRAST  Result Date: 03/21/2021 CLINICAL DATA:  Hip pain, stress fracture suspected, neg xray EXAM: MR OF THE LEFT HIP WITHOUT CONTRAST TECHNIQUE: Multiplanar, multisequence MR imaging was performed. No intravenous contrast was administered. COMPARISON:  Radiograph 03/20/2021 FINDINGS: Bones: There is mild disruption of the lesser trochanter cortex from tendon avulsion. There is no displaced fracture. There is no focal bone lesion. There is subchondral bony edema in the left inferior SI joint, this is on both sides of the joint in the sacrum and iliac bone and likely degenerative. Lower lumbar spine degenerative change. Articular cartilage and labrum Articular cartilage:  Mild to moderate chondrosis. Labrum: Degenerative superior labral tearing. Joint or bursal effusion Joint effusion: Small hip effusion. Bursae: There is fluid within the iliopsoas bursa, reactive from tendon tear. No evidence of trochanteric bursitis. Muscles and tendons Muscles and tendons: There is avulsion of the iliopsoas tendon from its insertion on the lesser trochanter, with differentially retracted fibers, overall retraction measuring approximately  5.5 cm. There is intramuscular edema in the left gluteal muscles, likely reactive. The distal gluteal tendons appear intact. Degenerative signal at the proximal hamstrings tendons likely normal for age. Reactive muscle edema in the adductors of the left hip.  Other findings Miscellaneous: Trace free fluid in the pelvis. Otherwise, the visualized structures are unremarkable. IMPRESSION: Acute full-thickness tear/avulsion of the distal iliopsoas tendon from its insertion on the lesser trochanter of the left femur. Mild disruption of the lesser trochanter bony cortex, with preservation of the majority of bony architecture. No evidence of underlying bony lesion in the left femur. Reactive joint effusion, iliopsoas bursitis, edema in the adductors, and edema in the gluteal musculature. Mild to moderate left hip osteoarthritis with degenerative superior labral tearing. Lower lumbar spine and left sacroiliac joint degenerative change. Electronically Signed   By: Caprice RenshawJacob  Kahn   On: 03/21/2021 08:15   DG Knee Complete 4 Views Left  Result Date: 03/20/2021 CLINICAL DATA:  Left knee pain EXAM: LEFT KNEE - COMPLETE 4+ VIEW COMPARISON:  None. FINDINGS: No evidence of fracture, dislocation, or joint effusion. Moderate tricompartmental osteoarthritis. Prominent vascular calcifications. IMPRESSION: 1. No acute osseous abnormality, left knee. 2. Moderate tricompartmental osteoarthritis. Electronically Signed   By: Duanne GuessNicholas  Plundo D.O.   On: 03/20/2021 13:51   DG Hip Unilat W or Wo Pelvis 2-3 Views Left  Result Date: 03/20/2021 CLINICAL DATA:  Acute left hip and leg pain, no reported injury. EXAM: DG HIP (WITH OR WITHOUT PELVIS) 2-3V LEFT COMPARISON:  None. FINDINGS: Hip joint space is relatively well maintained bilaterally. Mild subchondral sclerosis along the acetabular roofs bilaterally. No acute osseous abnormality. Degenerative changes in the spine. IMPRESSION: 1. No acute findings. 2. Mild bilateral hip osteoarthritis. Electronically Signed   By: Leanna BattlesMelinda  Blietz M.D.   On: 03/20/2021 13:48    (Echo, Carotid, EGD, Colonoscopy, ERCP)    Subjective: Patient seen and examined.  No overnight events.  It hurts on the left front hip on movement.  She was able to walk little  better yesterday as per the patient.   Discharge Exam: Vitals:   03/27/21 1534 03/28/21 0508  BP: (!) 118/49 (!) 156/67  Pulse: 72 72  Resp: 17 16  Temp: 97.6 F (36.4 C) 98.7 F (37.1 C)  SpO2: 95% 97%   Vitals:   03/27/21 0523 03/27/21 0732 03/27/21 1534 03/28/21 0508  BP: (!) 141/86 131/60 (!) 118/49 (!) 156/67  Pulse: 74 67 72 72  Resp: 18 16 17 16   Temp: 98.1 F (36.7 C) (!) 97.4 F (36.3 C) 97.6 F (36.4 C) 98.7 F (37.1 C)  TempSrc: Oral Oral Oral Oral  SpO2: 95% 98% 95% 97%  Weight:      Height:        General: Pt is alert, awake, not in acute distress Cardiovascular: RRR, S1/S2 +, no rubs, no gallops Respiratory: CTA bilaterally, no wheezing, no rhonchi Abdominal: Soft, NT, ND, bowel sounds + Extremities: no edema, no cyanosis Patient does have some tenderness on mobility of the left hip.  No obvious swelling or deformity.    The results of significant diagnostics from this hospitalization (including imaging, microbiology, ancillary and laboratory) are listed below for reference.     Microbiology: Recent Results (from the past 240 hour(s))  Resp Panel by RT-PCR (Flu A&B, Covid) Nasopharyngeal Swab     Status: None   Collection Time: 03/21/21 12:57 AM   Specimen: Nasopharyngeal Swab; Nasopharyngeal(NP) swabs in vial transport medium  Result Value Ref Range Status  SARS Coronavirus 2 by RT PCR NEGATIVE NEGATIVE Final    Comment: (NOTE) SARS-CoV-2 target nucleic acids are NOT DETECTED.  The SARS-CoV-2 RNA is generally detectable in upper respiratory specimens during the acute phase of infection. The lowest concentration of SARS-CoV-2 viral copies this assay can detect is 138 copies/mL. A negative result does not preclude SARS-Cov-2 infection and should not be used as the sole basis for treatment or other patient management decisions. A negative result may occur with  improper specimen collection/handling, submission of specimen other than  nasopharyngeal swab, presence of viral mutation(s) within the areas targeted by this assay, and inadequate number of viral copies(<138 copies/mL). A negative result must be combined with clinical observations, patient history, and epidemiological information. The expected result is Negative.  Fact Sheet for Patients:  BloggerCourse.com  Fact Sheet for Healthcare Providers:  SeriousBroker.it  This test is no t yet approved or cleared by the [place] FDA and  has been authorized for detection and/or diagnosis of SARS-CoV-2 by FDA under an Emergency Use Authorization (EUA). This EUA will remain  in effect (meaning this test can be used) for the duration of the COVID-19 declaration under Section 564(b)(1) of the Act, 21 U.S.C.section 360bbb-3(b)(1), unless the authorization is terminated  or revoked sooner.       Influenza A by PCR NEGATIVE NEGATIVE Final   Influenza B by PCR NEGATIVE NEGATIVE Final    Comment: (NOTE) The Xpert Xpress SARS-CoV-2/FLU/RSV plus assay is intended as an aid in the diagnosis of influenza from Nasopharyngeal swab specimens and should not be used as a sole basis for treatment. Nasal washings and aspirates are unacceptable for Xpert Xpress SARS-CoV-2/FLU/RSV testing.  Fact Sheet for Patients: BloggerCourse.com  Fact Sheet for Healthcare Providers: SeriousBroker.it  This test is not yet approved or cleared by the [place] FDA and has been authorized for detection and/or diagnosis of SARS-CoV-2 by FDA under an Emergency Use Authorization (EUA). This EUA will remain in effect (meaning this test can be used) for the duration of the COVID-19 declaration under Section 564(b)(1) of the Act, 21 U.S.C. section 360bbb-3(b)(1), unless the authorization is terminated or revoked.  Performed at [facility], [address], [place], [place] [address]       Labs: BNP (last 3 results) No results for input(s): BNP in the last 8760 hours. Basic Metabolic Panel: No results for input(s): NA, K, CL, CO2, GLUCOSE, BUN, CREATININE, CALCIUM, MG, PHOS in the last 168 hours. Liver Function Tests: No results for input(s): AST, ALT, ALKPHOS, BILITOT, PROT, ALBUMIN in the last 168 hours. No results for input(s): LIPASE, AMYLASE in the last 168 hours. No results for input(s): AMMONIA in the last 168 hours. CBC: No results for input(s): WBC, NEUTROABS, HGB, HCT, MCV, PLT in the last 168 hours. Cardiac Enzymes: No results for input(s): CKTOTAL, CKMB, CKMBINDEX, TROPONINI in the last 168 hours. BNP: Invalid input(s): POCBNP CBG: No results for input(s): GLUCAP in the last 168 hours. D-Dimer No results for input(s): DDIMER in the last 72 hours. Hgb A1c No results for input(s): HGBA1C in the last 72 hours. Lipid Profile No results for input(s): CHOL, HDL, LDLCALC, TRIG, CHOLHDL, LDLDIRECT in the last 72 hours. Thyroid function studies No results for input(s): TSH, T4TOTAL, T3FREE, THYROIDAB in the last 72 hours.  Invalid input(s): FREET3 Anemia work up No results for input(s): VITAMINB12, FOLATE, FERRITIN, TIBC, IRON, RETICCTPCT in the last 72 hours. Urinalysis    Component Value Date/Time   COLORURINE STRAW (A)  07/21/2020 0228   APPEARANCEUR CLEAR 07/21/2020 0228   LABSPEC 1.012 07/21/2020 0228   PHURINE 8.0 07/21/2020 0228   GLUCOSEU NEGATIVE 07/21/2020 0228   HGBUR NEGATIVE 07/21/2020 0228   BILIRUBINUR NEGATIVE 07/21/2020 0228   KETONESUR NEGATIVE 07/21/2020 0228   PROTEINUR NEGATIVE 07/21/2020 0228   NITRITE NEGATIVE 07/21/2020 0228   LEUKOCYTESUR NEGATIVE 07/21/2020 0228   Sepsis Labs Invalid input(s): PROCALCITONIN,  WBC,  LACTICIDVEN Microbiology Recent Results (from the past 240 hour(s))  Resp Panel by RT-PCR (Flu A&B, Covid) Nasopharyngeal Swab     Status: None   Collection Time: 03/21/21 12:57 AM   Specimen:  Nasopharyngeal Swab; Nasopharyngeal(NP) swabs in vial transport medium  Result Value Ref Range Status   SARS Coronavirus 2 by RT PCR NEGATIVE NEGATIVE Final    Comment: (NOTE) SARS-CoV-2 target nucleic acids are NOT DETECTED.  The SARS-CoV-2 RNA is generally detectable in upper respiratory specimens during the acute phase of infection. The lowest concentration of SARS-CoV-2 viral copies this assay can detect is 138 copies/mL. A negative result does not preclude SARS-Cov-2 infection and should not be used as the sole basis for treatment or other patient management decisions. A negative result may occur with  improper specimen collection/handling, submission of specimen other than nasopharyngeal swab, presence of viral mutation(s) within the areas targeted by this assay, and inadequate number of viral copies(<138 copies/mL). A negative result must be combined with clinical observations, patient history, and epidemiological information. The expected result is Negative.  Fact Sheet for Patients:  BloggerCourse.com  Fact Sheet for Healthcare Providers:  SeriousBroker.it  This test is no t yet approved or cleared by the [place] FDA and  has been authorized for detection and/or diagnosis of SARS-CoV-2 by FDA under an Emergency Use Authorization (EUA). This EUA will remain  in effect (meaning this test can be used) for the duration of the COVID-19 declaration under Section 564(b)(1) of the Act, 21 U.S.C.section 360bbb-3(b)(1), unless the authorization is terminated  or revoked sooner.       Influenza A by PCR NEGATIVE NEGATIVE Final   Influenza B by PCR NEGATIVE NEGATIVE Final    Comment: (NOTE) The Xpert Xpress SARS-CoV-2/FLU/RSV plus assay is intended as an aid in the diagnosis of influenza from Nasopharyngeal swab specimens and should not be used as a sole basis for treatment. Nasal washings and aspirates are unacceptable for  Xpert Xpress SARS-CoV-2/FLU/RSV testing.  Fact Sheet for Patients: BloggerCourse.com  Fact Sheet for Healthcare Providers: SeriousBroker.it  This test is not yet approved or cleared by the [place] FDA and has been authorized for detection and/or diagnosis of SARS-CoV-2 by FDA under an Emergency Use Authorization (EUA). This EUA will remain in effect (meaning this test can be used) for the duration of the COVID-19 declaration under Section 564(b)(1) of the Act, 21 U.S.C. section 360bbb-3(b)(1), unless the authorization is terminated or revoked.  Performed at [facility], [address], [place], [place] [address]      Time coordinating discharge: 28 minutes  SIGNED:   Dorcas Carrow, MD  [facility] 03/28/2021, 7:45 AM

## 2021-03-28 NOTE — Progress Notes (Signed)
Physical Therapy Treatment Patient Details Name: Nicole [name] MRN: [id number] DOB: 06/05/1930 Today's Date: 03/28/2021    History of Present Illness 87 y.o. female-who presented on 03/20/21 with severe left hip pain-further evaluation left iliopsoas tear/avulsion. Nonoperative management, WBAT, limit hip flexion to 90 degrees. She lives upstairs and gets down the stairs by sitting down and bumping down on her own. She denies any antecedent event. History of CVA with residual left-sided weakness, HTN, hypothyroidism    PT Comments    Pt with significant improvement today with ambulation tolerance and transfer ability. Pt reports her daughter works during the day, pt is not safe to be home alone and would need to be safe mod I level of function, however if 24/7 assist can be arranged pt would be safe to d/c home. Pt also with difficulty adhering to L hip <90 deg flexion. Acute PT to cont to follow.    Follow Up Recommendations  SNF     Equipment Recommendations  None recommended by PT    Recommendations for Other Services       Precautions / Restrictions Precautions Precautions: Fall Precaution Comments: WBAT, limit hip flexion to 90 degrees. Required Braces or Orthoses: Other Brace Other Brace: AFO Restrictions Weight Bearing Restrictions: No LLE Weight Bearing: Weight bearing as tolerated Other Position/Activity Restrictions: active and passive L hip exercise permitted with no flex past 90 on hip    Mobility  Bed Mobility               General bed mobility comments: pt up in chair upon PT arrival    Transfers Overall transfer level: Needs assistance Equipment used: Rolling walker (2 wheeled) Transfers: Sit to/from Stand Sit to Stand: Min guard         General transfer comment: min guard for safety, no physical assist needed, increased time, verbal cues for hand placement  Ambulation/Gait Ambulation/Gait assistance: Min guard Gait Distance (Feet): 60  Feet Assistive device: Rolling walker (2 wheeled) Gait Pattern/deviations: Step-to pattern;Antalgic;Decreased stance time - left Gait velocity: dec Gait velocity interpretation: <1.31 ft/sec, indicative of household ambulator General Gait Details: pt with good walker management, increased time but able to tolerate about 50% WBing on L LE, no physical assist needed   Stairs             Wheelchair Mobility    Modified Rankin (Stroke Patients Only)       Balance Overall balance assessment: Needs assistance Sitting-balance support: Feet supported Sitting balance-Leahy Scale: Good     Standing balance support: Bilateral upper extremity supported;During functional activity Standing balance-Leahy Scale: Poor Standing balance comment: UE support on RW                            Cognition Arousal/Alertness: Awake/alert Behavior During Therapy: WFL for tasks assessed/performed Overall Cognitive Status: Within Functional Limits for tasks assessed                                 General Comments: per chart pt with memory issues however pt very appropriate, able to follow commands, pt does have a hard time not bending at hip past 90 deg, however I think its more of a comprehension deficit vs memory      Exercises General Exercises - Lower Extremity Ankle Circles/Pumps: AROM;Both;10 reps Gluteal Sets: AROM;10 reps Long Arc Quad: AAROM;5 reps;Seated (AA to reach end range)  General Comments General comments (skin integrity, edema, etc.): VSS      Pertinent Vitals/Pain Pain Assessment: Faces Faces Pain Scale: Hurts little more Pain Location: L hip with exercises, no pain at rest Pain Descriptors / Indicators: Grimacing Pain Intervention(s): Monitored during session    Home Living                      Prior Function            PT Goals (current goals can now be found in the care plan section) Progress towards PT goals: Progressing  toward goals    Frequency    Min 3X/week      PT Plan Current plan remains appropriate    Co-evaluation              AM-PAC PT "6 Clicks" Mobility   Outcome Measure  Help needed turning from your back to your side while in a flat bed without using bedrails?: A Little Help needed moving from lying on your back to sitting on the side of a flat bed without using bedrails?: A Little Help needed moving to and from a bed to a chair (including a wheelchair)?: A Little Help needed standing up from a chair using your arms (e.g., wheelchair or bedside chair)?: A Little Help needed to walk in hospital room?: A Little Help needed climbing 3-5 steps with a railing? : A Lot 6 Click Score: 17    End of Session Equipment Utilized During Treatment: Gait belt Activity Tolerance: Patient limited by fatigue;Patient limited by pain;Other (comment);Treatment limited secondary to medical complications (Comment) Patient left: in chair;with call bell/phone within reach;with chair alarm set Nurse Communication: Mobility status PT Visit Diagnosis: Unsteadiness on feet (R26.81);Muscle weakness (generalized) (M62.81);Difficulty in walking, not elsewhere classified (R26.2);Pain Pain - Right/Left: Left Pain - part of body: Hip     Time: 6314-9702 PT Time Calculation (min) (ACUTE ONLY): 19 min  Charges:  $Gait Training: 8-22 mins                     Lewis Shock, PT, DPT Acute Rehabilitation Services Pager #: [phone] Office #: [phone]    Iona Hansen 03/28/2021, 12:30 PM

## 2021-03-28 NOTE — Plan of Care (Signed)

## 2021-03-28 NOTE — TOC Progression Note (Addendum)
Transition of Care [facility]) - Progression Note    Patient Details  Name: [name] MRN: [id number] Date of Birth: 1930/02/04  Transition of Care [facility]) CM/SW Contact  Epifanio Lesches, RN Phone Number: 03/28/2021, 9:06 AM  Clinical Narrative:    Insurance auth. still pending for SNF placement/ [facility].  TOC will continue to monitor and follow....  03/28/2021 @ 11:48 am NCM received call from [facility]. Irving Burton informed NCM pt's insurance not contracted with [facility]. NCM asked if contracted with [facility] pt's 2nd  choice. Irving Burton replied yes.  NCM called [facility] admissions/ Star and asked if they could extend bed offer. Star stated she will review and f/u with NCM.  5/24 12:30 pm [facility] extended SNF bed. Bed will be available on tomorrow.  03/28/2021 12:44 pm NCM received SNF authorization, [employer] ID # [id number], APPROVED X 3 DAYS 5/25-5/27, Evalyn Casco CM.  Expected Discharge Plan: Skilled Nursing Facility [facility]) Barriers to Discharge: Insurance Authorization  Expected Discharge Plan and Services Expected Discharge Plan: Skilled Nursing Facility [facility])   Discharge Planning Services: CM Consult   Living arrangements for the past 2 months: Single Family Home Expected Discharge Date: 03/28/21                                     Social Determinants of Health (SDOH) Interventions    Readmission Risk Interventions No flowsheet data found.

## 2021-03-29 DIAGNOSIS — I69354 Hemiplegia and hemiparesis following cerebral infarction affecting left non-dominant side: Secondary | ICD-10-CM | POA: Diagnosis not present

## 2021-03-29 DIAGNOSIS — M6281 Muscle weakness (generalized): Secondary | ICD-10-CM | POA: Diagnosis not present

## 2021-03-29 DIAGNOSIS — R404 Transient alteration of awareness: Secondary | ICD-10-CM | POA: Diagnosis not present

## 2021-03-29 DIAGNOSIS — Z8673 Personal history of transient ischemic attack (TIA), and cerebral infarction without residual deficits: Secondary | ICD-10-CM | POA: Diagnosis not present

## 2021-03-29 DIAGNOSIS — R41841 Cognitive communication deficit: Secondary | ICD-10-CM | POA: Diagnosis not present

## 2021-03-29 DIAGNOSIS — R262 Difficulty in walking, not elsewhere classified: Secondary | ICD-10-CM | POA: Diagnosis not present

## 2021-03-29 DIAGNOSIS — R1312 Dysphagia, oropharyngeal phase: Secondary | ICD-10-CM | POA: Diagnosis not present

## 2021-03-29 DIAGNOSIS — R2689 Other abnormalities of gait and mobility: Secondary | ICD-10-CM | POA: Diagnosis not present

## 2021-03-29 DIAGNOSIS — M669 Spontaneous rupture of unspecified tendon: Secondary | ICD-10-CM | POA: Diagnosis not present

## 2021-03-29 DIAGNOSIS — M25552 Pain in left hip: Secondary | ICD-10-CM | POA: Diagnosis not present

## 2021-03-29 DIAGNOSIS — S76012A Strain of muscle, fascia and tendon of left hip, initial encounter: Secondary | ICD-10-CM | POA: Diagnosis not present

## 2021-03-29 DIAGNOSIS — G8194 Hemiplegia, unspecified affecting left nondominant side: Secondary | ICD-10-CM | POA: Diagnosis not present

## 2021-03-29 DIAGNOSIS — R2681 Unsteadiness on feet: Secondary | ICD-10-CM | POA: Diagnosis not present

## 2021-03-29 DIAGNOSIS — I10 Essential (primary) hypertension: Secondary | ICD-10-CM | POA: Diagnosis not present

## 2021-03-29 DIAGNOSIS — E039 Hypothyroidism, unspecified: Secondary | ICD-10-CM | POA: Diagnosis not present

## 2021-03-29 DIAGNOSIS — S76012D Strain of muscle, fascia and tendon of left hip, subsequent encounter: Secondary | ICD-10-CM | POA: Diagnosis not present

## 2021-03-29 LAB — RESP PANEL BY RT-PCR (FLU A&B, COVID) ARPGX2
Influenza A by PCR: NEGATIVE
Influenza B by PCR: NEGATIVE
SARS Coronavirus 2 by RT PCR: NEGATIVE

## 2021-03-29 NOTE — Progress Notes (Signed)
Occupational Therapy Treatment Patient Details Name: Nicole [name] MRN: [id number] DOB: 1930/08/10 Today's Date: 03/29/2021    History of present illness 87 y.o. female-who presented on 03/20/21 with severe left hip pain-further evaluation left iliopsoas tear/avulsion. Nonoperative management, WBAT, limit hip flexion to 90 degrees. She lives upstairs and gets down the stairs by sitting down and bumping down on her own. She denies any antecedent event. History of CVA with residual left-sided weakness, HTN, hypothyroidism   OT comments  Nicole [name] is progressing well with plans to dc to SNF today. She completed bathing and dressing tasks from chair with mod A for lower body dressing, set up for upper body tasks and min guard for all sit<>stand transfers with rw. She continues to demonstrate good safety awareness. Pt would benefit from continues acute OT services to maximize indep should her length of stay continue. D/c plan remains appropriate.    Follow Up Recommendations  SNF;Supervision/Assistance - 24 hour    Equipment Recommendations  Tub/shower bench    Recommendations for Other Services      Precautions / Restrictions Precautions Precautions: Fall Precaution Comments: WBAT, limit hip flexion to 90 degrees. Required Braces or Orthoses: Other Brace Other Brace: AFO Restrictions Weight Bearing Restrictions: No LLE Weight Bearing: Weight bearing as tolerated Other Position/Activity Restrictions: active and passive L hip exercise permitted with no flex past 90 on hip       Mobility Bed Mobility Overal bed mobility: Needs Assistance             General bed mobility comments: pt in chair upon arrival    Transfers Overall transfer level: Needs assistance Equipment used: Rolling walker (2 wheeled) Transfers: Sit to/from Stand Sit to Stand: Min guard         General transfer comment: min guard for safety, no physical assist needed, increased time    Balance Overall balance  assessment: Needs assistance Sitting-balance support: Feet supported Sitting balance-Leahy Scale: Good     Standing balance support: Bilateral upper extremity supported;During functional activity Standing balance-Leahy Scale: Poor Standing balance comment: UE support on RW                           ADL either performed or assessed with clinical judgement   ADL Overall ADL's : Needs assistance/impaired Eating/Feeding: Set up;Sitting       Upper Body Bathing: Set up;Sitting   Lower Body Bathing: Minimal assistance;Sit to/from stand   Upper Body Dressing : Set up;Sitting   Lower Body Dressing: Moderate assistance;Sit to/from stand               Functional mobility during ADLs: Min guard;Rolling walker General ADL Comments: Pt completed bathing and dressing to prepare for discharge, completed several sit<>stands with min guard, balanced in standing with no UE external support for functional activity     Cognition Arousal/Alertness: Awake/alert Behavior During Therapy: WFL for tasks assessed/performed Overall Cognitive Status: Within Functional Limits for tasks assessed                       Pertinent Vitals/ Pain       Pain Assessment: Faces Faces Pain Scale: Hurts a little bit Pain Location: L hip with weightbearing Pain Descriptors / Indicators: Grimacing Pain Intervention(s): Limited activity within patient's tolerance;Monitored during session         Frequency  Min 2X/week        Progress Toward Goals  OT Goals(current goals  can now be found in the care plan section)  Progress towards OT goals: Progressing toward goals  Acute Rehab OT Goals Patient Stated Goal: get back on her feet OT Goal Formulation: With patient Time For Goal Achievement: 04/04/21 Potential to Achieve Goals: Fair ADL Goals Pt Will Perform Lower Body Bathing: with set-up;sit to/from stand Pt Will Perform Lower Body Dressing: with min guard assist;sit to/from  stand Pt Will Transfer to Toilet: with min guard assist;ambulating Additional ADL Goal #1: Pt will recall at least 3 fall prevention strategies to ensure safe transition into the next venue  Plan Discharge plan remains appropriate       AM-PAC OT "6 Clicks" Daily Activity     Outcome Measure   Help from another person eating meals?: A Little Help from another person taking care of personal grooming?: A Little Help from another person toileting, which includes using toliet, bedpan, or urinal?: A Little Help from another person bathing (including washing, rinsing, drying)?: A Lot Help from another person to put on and taking off regular upper body clothing?: A Little Help from another person to put on and taking off regular lower body clothing?: A Lot 6 Click Score: 16    End of Session Equipment Utilized During Treatment: Rolling walker  OT Visit Diagnosis: Unsteadiness on feet (R26.81);Muscle weakness (generalized) (M62.81);Pain Pain - Right/Left: Left Pain - part of body: Hip   Activity Tolerance Patient tolerated treatment well   Patient Left in chair;with call bell/phone within reach;Other (comment)   Nurse Communication Mobility status        Time: 1445 (245)-1510 OT Time Calculation (min): 25 min  Charges: OT General Charges $OT Visit: 1 Visit OT Treatments $Self Care/Home Management : 23-37 mins    Amayah Staheli A Krista Godsil 03/29/2021, 3:21 PM

## 2021-03-29 NOTE — Progress Notes (Signed)
Patient seen and examined at bedside. Discharge medications reviewed with no changes. Patient stable for discharge 03/29/2021.  Jacquelin Hawking, MD [facility] 03/29/2021, 12:08 PM

## 2021-03-29 NOTE — TOC Transition Note (Addendum)
Transition of Care [facility]) - CM/SW Discharge Note   Patient Details  Name: Nicole [name] MRN: [id number] Date of Birth: Feb 16, 1930  Transition of Care [facility]) CM/SW Contact:  Epifanio Lesches, RN Phone Number: 03/29/2021, 3:06 PM   Clinical Narrative:    Patient will DC to: [facility] Anticipated DC date: 03/29/2021 Family notified: yes, [name] Transport by: Sharin Mons   Per MD patient ready for DC today. RN, patient, patient's family, and facility notified of DC. Discharge Summary and FL2 sent to facility. RN to call report prior to discharge [phone]).  Rm# [id number] p.DC packet on chart. Ambulance transport requested for patient.  [name] (Daughter)     [phone]       RNCM will sign off for now as intervention is no longer needed. Please consult [place] again if new needs arise.     Barriers to Discharge: No Barriers Identified   Patient Goals and CMS Choice Patient states their goals for this hospitalization and ongoing recovery are:: to get better and go home      Discharge Placement                       Discharge Plan and Services   Discharge Planning Services: CM Consult                                 Social Determinants of Health (SDOH) Interventions     Readmission Risk Interventions No flowsheet data found.

## 2021-03-30 DIAGNOSIS — M6281 Muscle weakness (generalized): Secondary | ICD-10-CM | POA: Diagnosis not present

## 2021-03-30 DIAGNOSIS — E039 Hypothyroidism, unspecified: Secondary | ICD-10-CM | POA: Diagnosis not present

## 2021-03-30 DIAGNOSIS — I10 Essential (primary) hypertension: Secondary | ICD-10-CM | POA: Diagnosis not present

## 2021-03-30 DIAGNOSIS — R2681 Unsteadiness on feet: Secondary | ICD-10-CM | POA: Diagnosis not present

## 2021-03-30 DIAGNOSIS — Z8673 Personal history of transient ischemic attack (TIA), and cerebral infarction without residual deficits: Secondary | ICD-10-CM | POA: Diagnosis not present

## 2021-03-30 DIAGNOSIS — I69354 Hemiplegia and hemiparesis following cerebral infarction affecting left non-dominant side: Secondary | ICD-10-CM | POA: Diagnosis not present

## 2021-03-30 DIAGNOSIS — M669 Spontaneous rupture of unspecified tendon: Secondary | ICD-10-CM | POA: Diagnosis not present

## 2021-03-30 DIAGNOSIS — S76012D Strain of muscle, fascia and tendon of left hip, subsequent encounter: Secondary | ICD-10-CM | POA: Diagnosis not present

## 2021-03-30 NOTE — Progress Notes (Signed)
[facility] came to transport patient to SNF - [facility]. Patient is alert and oriented x 4.  She refused any pain medication at this time.

## 2021-04-06 DIAGNOSIS — S76012D Strain of muscle, fascia and tendon of left hip, subsequent encounter: Secondary | ICD-10-CM | POA: Diagnosis not present

## 2021-04-06 DIAGNOSIS — Z8673 Personal history of transient ischemic attack (TIA), and cerebral infarction without residual deficits: Secondary | ICD-10-CM | POA: Diagnosis not present

## 2021-04-06 DIAGNOSIS — R2681 Unsteadiness on feet: Secondary | ICD-10-CM | POA: Diagnosis not present

## 2021-04-06 DIAGNOSIS — M6281 Muscle weakness (generalized): Secondary | ICD-10-CM | POA: Diagnosis not present

## 2021-04-10 DIAGNOSIS — R2681 Unsteadiness on feet: Secondary | ICD-10-CM | POA: Diagnosis not present

## 2021-04-10 DIAGNOSIS — Z8673 Personal history of transient ischemic attack (TIA), and cerebral infarction without residual deficits: Secondary | ICD-10-CM | POA: Diagnosis not present

## 2021-04-10 DIAGNOSIS — S76012D Strain of muscle, fascia and tendon of left hip, subsequent encounter: Secondary | ICD-10-CM | POA: Diagnosis not present

## 2021-04-10 DIAGNOSIS — M6281 Muscle weakness (generalized): Secondary | ICD-10-CM | POA: Diagnosis not present

## 2021-04-11 DIAGNOSIS — E039 Hypothyroidism, unspecified: Secondary | ICD-10-CM | POA: Diagnosis not present

## 2021-04-11 DIAGNOSIS — M669 Spontaneous rupture of unspecified tendon: Secondary | ICD-10-CM | POA: Diagnosis not present

## 2021-04-11 DIAGNOSIS — I10 Essential (primary) hypertension: Secondary | ICD-10-CM | POA: Diagnosis not present

## 2021-04-11 DIAGNOSIS — I69354 Hemiplegia and hemiparesis following cerebral infarction affecting left non-dominant side: Secondary | ICD-10-CM | POA: Diagnosis not present

## 2021-04-13 DIAGNOSIS — I10 Essential (primary) hypertension: Secondary | ICD-10-CM | POA: Diagnosis not present

## 2021-04-13 DIAGNOSIS — I69354 Hemiplegia and hemiparesis following cerebral infarction affecting left non-dominant side: Secondary | ICD-10-CM | POA: Diagnosis not present

## 2021-04-13 DIAGNOSIS — E039 Hypothyroidism, unspecified: Secondary | ICD-10-CM | POA: Diagnosis not present

## 2021-04-13 DIAGNOSIS — M669 Spontaneous rupture of unspecified tendon: Secondary | ICD-10-CM | POA: Diagnosis not present

## 2021-04-16 DIAGNOSIS — M160 Bilateral primary osteoarthritis of hip: Secondary | ICD-10-CM | POA: Diagnosis not present

## 2021-04-16 DIAGNOSIS — I739 Peripheral vascular disease, unspecified: Secondary | ICD-10-CM | POA: Diagnosis not present

## 2021-04-16 DIAGNOSIS — Z7951 Long term (current) use of inhaled steroids: Secondary | ICD-10-CM | POA: Diagnosis not present

## 2021-04-16 DIAGNOSIS — Z9181 History of falling: Secondary | ICD-10-CM | POA: Diagnosis not present

## 2021-04-16 DIAGNOSIS — E785 Hyperlipidemia, unspecified: Secondary | ICD-10-CM | POA: Diagnosis not present

## 2021-04-16 DIAGNOSIS — J45909 Unspecified asthma, uncomplicated: Secondary | ICD-10-CM | POA: Diagnosis not present

## 2021-04-16 DIAGNOSIS — R1312 Dysphagia, oropharyngeal phase: Secondary | ICD-10-CM | POA: Diagnosis not present

## 2021-04-16 DIAGNOSIS — R351 Nocturia: Secondary | ICD-10-CM | POA: Diagnosis not present

## 2021-04-16 DIAGNOSIS — Z7902 Long term (current) use of antithrombotics/antiplatelets: Secondary | ICD-10-CM | POA: Diagnosis not present

## 2021-04-16 DIAGNOSIS — H409 Unspecified glaucoma: Secondary | ICD-10-CM | POA: Diagnosis not present

## 2021-04-16 DIAGNOSIS — E039 Hypothyroidism, unspecified: Secondary | ICD-10-CM | POA: Diagnosis not present

## 2021-04-16 DIAGNOSIS — I69354 Hemiplegia and hemiparesis following cerebral infarction affecting left non-dominant side: Secondary | ICD-10-CM | POA: Diagnosis not present

## 2021-04-16 DIAGNOSIS — S76012D Strain of muscle, fascia and tendon of left hip, subsequent encounter: Secondary | ICD-10-CM | POA: Diagnosis not present

## 2021-04-16 DIAGNOSIS — I10 Essential (primary) hypertension: Secondary | ICD-10-CM | POA: Diagnosis not present

## 2021-04-16 DIAGNOSIS — H9190 Unspecified hearing loss, unspecified ear: Secondary | ICD-10-CM | POA: Diagnosis not present

## 2021-04-19 DIAGNOSIS — G8194 Hemiplegia, unspecified affecting left nondominant side: Secondary | ICD-10-CM | POA: Diagnosis not present

## 2021-04-19 DIAGNOSIS — I6350 Cerebral infarction due to unspecified occlusion or stenosis of unspecified cerebral artery: Secondary | ICD-10-CM | POA: Diagnosis not present

## 2021-04-19 DIAGNOSIS — I6381 Other cerebral infarction due to occlusion or stenosis of small artery: Secondary | ICD-10-CM | POA: Diagnosis not present

## 2021-04-19 DIAGNOSIS — M25562 Pain in left knee: Secondary | ICD-10-CM | POA: Diagnosis not present

## 2021-04-25 DIAGNOSIS — S76012A Strain of muscle, fascia and tendon of left hip, initial encounter: Secondary | ICD-10-CM | POA: Diagnosis not present

## 2021-04-25 DIAGNOSIS — M25552 Pain in left hip: Secondary | ICD-10-CM | POA: Diagnosis not present

## 2021-05-03 ENCOUNTER — Encounter: Payer: Self-pay | Admitting: Adult Health

## 2021-05-03 ENCOUNTER — Ambulatory Visit: Payer: Medicare Other | Admitting: Adult Health

## 2021-05-03 VITALS — BP 149/76 | HR 84 | Ht 63.0 in

## 2021-05-03 DIAGNOSIS — E785 Hyperlipidemia, unspecified: Secondary | ICD-10-CM

## 2021-05-03 DIAGNOSIS — G8114 Spastic hemiplegia affecting left nondominant side: Secondary | ICD-10-CM | POA: Diagnosis not present

## 2021-05-03 DIAGNOSIS — I10 Essential (primary) hypertension: Secondary | ICD-10-CM | POA: Diagnosis not present

## 2021-05-03 DIAGNOSIS — I639 Cerebral infarction, unspecified: Secondary | ICD-10-CM

## 2021-05-03 NOTE — Progress Notes (Signed)
[facility] [address] [place]. [place] [address] [phone]       OFFICE FOLLOW-UP NOTE  Nicole [name] Date of Birth:  1930-10-02 Medical Record Number:  [id number]    Primary neurologist: Dr. Pearlean Brownie Primary care provider: Geoffry Paradise, MD    Reason for visit: Stroke follow-up   Chief Complaint  Patient presents with   Follow-up    RM 14 with personal CNA Pam Pt is well, L sided weakness/immobility      HPI:   Today, 05/03/2021, Nicole [name] returns for 90-month stroke follow-up accompanied by her personal CNA Pam.  Stable from stroke standpoint without new stroke/TIA symptoms.  She reports residual left-sided weakness with ongoing improvement since prior visit.  She has been working with [facility] PT/OT.  Currently ambulating short distance with rolling walker and denies any recent falls.  Reports compliance on Plavix and atorvastatin without associated side effects.  Blood pressure today 149/76 -does not routinely monitor at home.  No concerns at this time.   History provided for reference purposes only Initial visit 11/01/2020 JM: Nicole [name] is a pleasant 87 year old African-American lady seen today for initial office follow-up visit following hospital consultation for stroke in September 2021.  History is obtained from the patient and daughter as well as review of electronic medical records and I personally reviewed pertinent imaging films in PACS.  She has past medical history of hyperlipidemia, hypertension, hypothyroidism who presented on 07/20/2020 to [facility] with sudden onset of left-sided weakness but presented outside the window for TPA.  NIH stroke scale was 8 on admission.  Stat CT scan of the head revealed no acute changes and CT angiogram of the head and neck showed only minor atherosclerotic changes but no large vessel stenosis or occlusion.  MRI scan of the brain shows a large 2 cm diffusion positive lesion in the right posterior corona  radiata and right lentiform nucleus.  LDL cholesterol was 125 mg percent hemoglobin A1c was 5.4.  2D echo showed normal ejection fraction without cardiac source of embolism.  Cardiac telemetry monitoring did not show any atrial arrhythmias.  Qualified for an participate in the BMS stroke prevention trial and completed trial participation on 10/18/2020.  She was placed on Plavix alone as she had been on aspirin prior to admission.  Patient is currently living at home with her daughter.  She is getting outpatient physical occupational therapy.  She states her left upper extremity strength has improved but she is still has lack of confidence in significant leg weakness and is not able to walk even with the help of the therapist.  She is however making slow progress.  Her speech has improved.  She is tolerating Plavix well with only minor bruising.  Blood pressures well controlled and today it is 113/62.  She is tolerating Lipitor well without muscle aches and pains.  She has no new complaints.  She had follow-up MRI scan of the brain done as per [facility] stroke study protocol and 10/19/2020 which showed expected evolutionary changes in the recent large right corona radiata infarct without any unexpected or new findings.  ROS:   14 system review of systems is positive for those listed in HPI all other systems negative  PMH:  Past Medical History:  Diagnosis Date   Hypertension    Stroke [facility])    September 2021   Thyroid disease     Social History:  Social History   Socioeconomic History   Marital status: Single  Spouse name: Not on file   Number of children: Not on file   Years of education: Not on file   Highest education level: Not on file  Occupational History   Not on file  Tobacco Use   Smoking status: Never   Smokeless tobacco: Never  Vaping Use   Vaping Use: Never used  Substance and Sexual Activity   Alcohol use: No    Alcohol/week: 0.0 standard drinks   Drug use: No   Sexual  activity: Not on file  Other Topics Concern   Not on file  Social History Narrative   Lives with daughter   Right handed   Drinks 1-2 cups caffeine daily   Social Determinants of Health   Financial Resource Strain: Not on file  Food Insecurity: Not on file  Transportation Needs: Not on file  Physical Activity: Not on file  Stress: Not on file  Social Connections: Not on file  Intimate Partner Violence: Not on file    Medications:   Current Outpatient Medications on File Prior to Visit  Medication Sig Dispense Refill   acetaminophen (TYLENOL) 325 MG tablet Take 2 tablets (650 mg total) by mouth every 8 (eight) hours. (Patient taking differently: Take 650 mg by mouth every 6 (six) hours as needed for mild pain or headache.)     albuterol (VENTOLIN HFA) 108 (90 Base) MCG/ACT inhaler INHALE 1 PUFF INTO THE LUNGS EVERY FOUR HOURS AS NEEDED FOR WHEEZING OR SHORTNESS OF BREATH. (Patient taking differently: Inhale 1 puff into the lungs every 4 (four) hours as needed for wheezing or shortness of breath.) 8.5 g 0   amLODipine (NORVASC) 10 MG tablet Take 10 mg by mouth daily.     atorvastatin (LIPITOR) 80 MG tablet TAKE 1 TABLET (80 MG TOTAL) BY MOUTH DAILY. (Patient taking differently: Take 80 mg by mouth daily.) 45 tablet 1   baclofen (LIORESAL) 10 MG tablet Take 1 tablet (10 mg total) by mouth 4 (four) times daily. Take 1 pill in AM and 1 in afternoon and 1.5 to 2 pills at night- for spasticity (Patient taking differently: Take 10-20 mg by mouth See admin instructions. 10 mg in the morning and afternoon. 15-20 mg at bedtime.) 120 each 5   Calcium Carb-Cholecalciferol (CALCIUM + D3) 600-800 MG-UNIT TABS Take 1 tablet by mouth 2 (two) times daily.     cetirizine (ZYRTEC) 10 MG tablet Take 1 tablet (10 mg total) by mouth daily as needed for allergies or rhinitis. (Patient taking differently: Take 10 mg by mouth daily.)     clopidogrel (PLAVIX) 75 MG tablet Take 1 tablet (75 mg total) by mouth  daily. 90 tablet 3   diclofenac Sodium (VOLTAREN) 1 % GEL APPLY 2 G TOPICALLY FOUR TIMES DAILY AS NEEDED TO AFFECTED AREAS- FOR PAIN. (Patient taking differently: Apply 2-4 g topically 4 (four) times daily as needed (pain).) 200 g 0   hydrochlorothiazide (HYDRODIURIL) 25 MG tablet Take 25 mg by mouth daily.     levothyroxine (SYNTHROID) 50 MCG tablet Take 1 tablet (50 mcg total) by mouth daily before breakfast. 30 tablet 0   lisinopril (ZESTRIL) 20 MG tablet TAKE 1 TABLET (20 MG TOTAL) BY MOUTH DAILY. (Patient taking differently: Take 20 mg by mouth daily.) 30 tablet 0   oxybutynin (DITROPAN-XL) 5 MG 24 hr tablet Take 5 mg by mouth daily.     pantoprazole (PROTONIX) 40 MG tablet Take 40 mg by mouth 2 (two) times daily as needed.     polyethylene  glycol (MIRALAX / GLYCOLAX) 17 g packet Take 17 g by mouth daily as needed. (Patient taking differently: Take 17 g by mouth daily as needed for mild constipation.) 14 each 0   vitamin B-12 (CYANOCOBALAMIN) 250 MCG tablet Take 1 tablet (250 mcg total) by mouth daily. 30 tablet 0   Vitamin D3 (VITAMIN D) 25 MCG tablet TAKE 1 TABLET (1,000 UNITS TOTAL) BY MOUTH DAILY. 30 tablet 0   No current facility-administered medications on file prior to visit.    Allergies:  No Known Allergies  Physical Exam Today's Vitals   05/03/21 1045  BP: (!) 149/76  Pulse: 84  Height: 5\' 3"  (1.6 m)   Body mass index is 23.91 kg/m.   General: well developed, well nourished very pleasant elderly African-American lady, seated, in no evident distress Head: head normocephalic and atraumatic.  Neck: supple with no carotid or supraclavicular bruits Cardiovascular: regular rate and rhythm, no murmurs Musculoskeletal: no deformity Skin:  no rash/petichiae Vascular:  Normal pulses all extremities  Neurologic Exam Mental Status: Awake and fully alert.  Fluent speech.  Oriented to place and time. Recent and remote memory intact. Attention span, concentration and fund of  knowledge appropriate. Mood and affect appropriate.  Cranial Nerves:  Pupils equal, briskly reactive to light. Extraocular movements full without nystagmus. Visual fields full to confrontation. Hearing intact. Facial sensation intact.  Mild left lower facial weakness., tongue, palate moves normally and symmetrically.  Motor: Left hemiparesis with subtle left upper extremity drift with mild weakness of left grip and intrinsic hand muscles and orbits right over left upper extremity.  Left lower extremity strength is 4/5 proximal and 3/5 ADF with mild swelling.  Tone is increased slightly on the left compared to the right.  Right-sided strength is normal Sensory.: intact to pinprick .position and vibratory sensation although slight decrease sensation with light touch on left side Coordination: Rapid alternating movements normal in all extremities except slightly decreased left hand. Finger-to-nose and heel-to-shin performed accurately bilaterally. Gait and Station: Deferred as RW not present Reflexes: 1+ and asymmetric and brisker on the left. Toes downgoing.       ASSESSMENT: 87 year old African-American lady with right brain large subcortical infarct of cryptogenic etiology in September 2021 with vascular risk factors of hypertension hyperlipidemia and age.  She has completed participation in the BMS stroke prevention trial     PLAN:  1.  Right subcortical stroke, cryptogenic -Residual left spastic hemiparesis with improvement compared to prior visit -encourage continued participation with [facility] PT/OT.  Use of rolling walker at all times unless otherwise instructed -Continue Plavix and atorvastatin 80 mg daily for secondary stroke prevention -Discussed secondary stroke prevention measures and importance of aggressive stroke risk factor management monitored and managed by PCP  2.  HTN -BP goal<130/90. Stable on amlodipine, hydrochlorothiazide and lisinopril per PCP  3.  HLD -LDL goal<70.  On  atorvastatin 80 mg daily -reports routine lab work by PCP which has been satisfactory (unable to personally view via epic)   Follow-up in 6 months or call earlier if needed    CC:  [facility] provider: Dr. [facility], Rocky Link, MD   I spent 26 minutes of face-to-face and non-face-to-face time with patient and CNA.  This included previsit chart review, lab review, study review, electronic health record documentation, and patient and CNA education and discussion regarding prior stroke including secondary stroke prevention measures and aggressive stroke risk factor management, residual deficits and further recovery and answered all other questions to patient and CNA's  satisfaction  Frann Rider, AGNP-BC  [facility] [address] [place], [place] [address]  Phone [phone] Fax [phone] Note: This document was prepared with digital dictation and possible smart phrase technology. Any transcriptional errors that result from this process are unintentional.

## 2021-05-03 NOTE — Patient Instructions (Signed)
Continue working with home health therapies - you have made progress since prior visit - keep up the good work!!  Continue clopidogrel 75 mg daily  and atorvastatin 80 mg daily for secondary stroke prevention  Continue to follow up with PCP regarding cholesterol and blood pressure management  Maintain strict control of hypertension with blood pressure goal below 130/90 and cholesterol with LDL cholesterol (bad cholesterol) goal below 70 mg/dL.       Followup in the future with me in 6 months or call earlier if needed       Thank you for coming to see [place] at [facility]. I hope we have been able to provide you high quality care today.  You may receive a patient satisfaction survey over the next few weeks. We would appreciate your feedback and comments so that we may continue to improve ourselves and the health of our patients.

## 2021-05-05 NOTE — Progress Notes (Signed)
I agree with the above plan 

## 2021-05-19 DIAGNOSIS — I6350 Cerebral infarction due to unspecified occlusion or stenosis of unspecified cerebral artery: Secondary | ICD-10-CM | POA: Diagnosis not present

## 2021-05-19 DIAGNOSIS — M25562 Pain in left knee: Secondary | ICD-10-CM | POA: Diagnosis not present

## 2021-05-19 DIAGNOSIS — G8194 Hemiplegia, unspecified affecting left nondominant side: Secondary | ICD-10-CM | POA: Diagnosis not present

## 2021-05-19 DIAGNOSIS — I6381 Other cerebral infarction due to occlusion or stenosis of small artery: Secondary | ICD-10-CM | POA: Diagnosis not present

## 2021-05-29 DIAGNOSIS — S76012A Strain of muscle, fascia and tendon of left hip, initial encounter: Secondary | ICD-10-CM | POA: Diagnosis not present

## 2021-06-06 ENCOUNTER — Ambulatory Visit: Payer: Medicare Other | Attending: Physician Assistant

## 2021-06-06 ENCOUNTER — Other Ambulatory Visit: Payer: Self-pay

## 2021-06-06 DIAGNOSIS — R2689 Other abnormalities of gait and mobility: Secondary | ICD-10-CM

## 2021-06-06 DIAGNOSIS — R2681 Unsteadiness on feet: Secondary | ICD-10-CM | POA: Diagnosis not present

## 2021-06-06 DIAGNOSIS — M6281 Muscle weakness (generalized): Secondary | ICD-10-CM | POA: Diagnosis not present

## 2021-06-06 DIAGNOSIS — I69354 Hemiplegia and hemiparesis following cerebral infarction affecting left non-dominant side: Secondary | ICD-10-CM

## 2021-06-06 DIAGNOSIS — S76012A Strain of muscle, fascia and tendon of left hip, initial encounter: Secondary | ICD-10-CM | POA: Diagnosis not present

## 2021-06-06 DIAGNOSIS — M21372 Foot drop, left foot: Secondary | ICD-10-CM | POA: Diagnosis not present

## 2021-06-06 DIAGNOSIS — R262 Difficulty in walking, not elsewhere classified: Secondary | ICD-10-CM | POA: Insufficient documentation

## 2021-06-06 DIAGNOSIS — R278 Other lack of coordination: Secondary | ICD-10-CM | POA: Insufficient documentation

## 2021-06-07 NOTE — Therapy (Signed)
[facility] [address] [place], [place], [address] Phone: [phone]   Fax:  [phone]  Physical Therapy Evaluation  Patient Details  Name: Nicole [name] MRN: [id number] Date of Birth: September 10, 1930 Referring Provider (PT): Dion Saucier D, [place]   Encounter Date: 06/06/2021   PT End of Session - 06/07/21 0951     Visit Number 1    Number of Visits 13    Date for PT Re-Evaluation 07/29/21    Authorization Type BCBS MEDICARE    PT Start Time 1100    PT Stop Time 1153    PT Time Calculation (min) 53 min    Equipment Utilized During Treatment Other (comment)   RW   Activity Tolerance Patient limited by fatigue    Behavior During Therapy [facility] for tasks assessed/performed             Past Medical History:  Diagnosis Date   Hypertension    Stroke [facility])    September 2021   Thyroid disease     History reviewed. No pertinent surgical history.  There were no vitals filed for this visit.    Subjective Assessment - 06/07/21 0916     Subjective Pt reports injuring her L hip in May, but currently she is not experincing pain with it. Pt notes it is weak and feels numb.    Pertinent History R CVA Sept 2021    Limitations Standing;Walking;House hold activities    Diagnostic tests 03/21/21- CT L hip  IMPRESSION:  Acute full-thickness tear/avulsion of the distal iliopsoas tendon  from its insertion on the lesser trochanter of the left femur. Mild  disruption of the lesser trochanter bony cortex, with preservation  of the majority of bony architecture. No evidence of underlying bony  lesion in the left femur. Reactive joint effusion, iliopsoas  bursitis, edema in the adductors, and edema in the gluteal  musculature.     Mild to moderate left hip osteoarthritis with degenerative superior  labral tearing.     Lower lumbar spine and left sacroiliac joint degenerative change. 03/20/21- Xray L knee IMPRESSION:  1. No acute osseous abnormality,  left knee.  2. Moderate tricompartmental osteoarthritis.    Patient Stated Goals To be able to walk better    Currently in Pain? No/denies                [facility] PT Assessment - 06/07/21 0001       Assessment   Medical Diagnosis Strain of muscle, fascia and tendon of left hip, initial encounter    Referring Provider (PT) Dion Saucier D, PA    Onset Date/Surgical Date 03/20/21    Next MD Visit 11/16/21 [facility]    Prior Therapy yes      Precautions   Precautions None      Restrictions   Weight Bearing Restrictions No      Balance Screen   Has the patient fallen in the past 6 months No      Home Environment   Living Environment Private residence    Living Arrangements Children    Type of Home House    Home Access Stairs to enter    Entrance Stairs-Number of Steps 1    Entrance Stairs-Rails None    Home Layout Two level    Alternate Level Stairs-Number of Steps 13   chair lift for stairs   Home Equipment Walker - 2 wheels      Prior Function   Level of Independence  Needs assistance with ADLs;Needs assistance with homemaking;Needs assistance with gait;Needs assistance with transfers    Vocation Retired      [employer]   Overall Cognitive Status Within Functional Limits for tasks assessed      Observation/Other Assessments   Observations HOH, arthritic changes of the R knee with valgus deformity    Focus on Therapeutic Outcomes (FOTO)  TBA      Observation/Other Assessments-Edema    Edema --   L lower leg     Sensation   Light Touch Not tested      Coordination   Gross Motor Movements are Fluid and Coordinated No   decreased quality of movement c L UE and LE   Fine Motor Movements are Fluid and Coordinated No   Decreased dexterity of the L hand     Posture/Postural Control   Posture/Postural Control Postural limitations    Postural Limitations Rounded Shoulders;Forward head;Decreased lumbar lordosis;Increased thoracic kyphosis      ROM / Strength    AROM / PROM / Strength AROM;Strength      AROM   Overall AROM Comments L elbow: min decreased exteension. L shoulder mod decreased in flexion. L ankle: mod decrease in DF. decrease in trunk/hip extension. R extremitites are grossly WFLs.      Strength   Overall Strength Comments Gross Strength: grip: L 3, R 4. Elbow: L 4+, R 5. Shoulder: 3-, R 4. Hip: L 3-, R 4+. Knee: L 4, R 5. ankle: L 3-, R 4+.      Palpation   Palpation comment not TTP to the L flank or lateral hip.      Transfers   Transfers Sit to Stand;Stand to Sit    Sit to Stand 5: Supervision    Five time sit to stand comments  37.4 sec c use of hands      Ambulation/Gait   Ambulation/Gait Yes    Ambulation/Gait Assistance 5: Supervision    Ambulation Distance (Feet) 70 Feet    Assistive device Rolling walker    Gait Pattern Step-to pattern;Decreased step length - right;Decreased step length - left;Left circumduction;Shuffle;Trunk flexed;Poor foot clearance - left;Poor foot clearance - right    Ambulation Surface Level    Gait velocity Decreased pace    Gait Comments Use chair lift for steps at home      Balance   Balance Assessed Yes      Standardized Balance Assessment   Standardized Balance Assessment Timed Up and Go Test      Timed Up and Go Test   Normal TUG (seconds) 56.8   c RW                       Objective measurements completed on examination: See above findings.                 PT Short Term Goals - 06/07/21 0942       PT SHORT TERM GOAL #1   Title Pt/CG will be Ind in an initial HEP    Status New    Target Date 06/28/21               PT Long Term Goals - 06/07/21 0945       PT LONG TERM GOAL #1   Title Pt will complete mat table t/f chair transfer with RW Indly for safe mobility at home    Status New    Target Date 07/29/21      PT  LONG TERM GOAL #2   Title Pt will be able to walk Indly c RW 3750ft for mobility within home    Status New    Target Date  07/29/21      PT LONG TERM GOAL #3   Title Pt will be able to amb 14550ft c RW and SBA for community mobility    Status New    Target Date 07/29/21      PT LONG TERM GOAL #4   Title Patient will improve TUG to </= 40 seconds to demonstrate reduced fall risk and improved balance    Status New    Target Date 07/29/21      PT LONG TERM GOAL #5   Title Patient will improve 5xSTS to </= 25 seconds to demonstrate reduced fall risk and improved balance    Status New    Target Date 07/29/21      Additional Long Term Goals   Additional Long Term Goals Yes      PT LONG TERM GOAL #6   Title Pt/CG will be Ind in a final HEP to maintain achieved LOF    Status New    Target Date 07/29/21                    Plan - 06/07/21 [id number]     Clinical Impression Statement Pt presents to PT with a decrease in her functional ability following a full-thickness tear/avulsion of the distal iliopsoas tendon from its insertion on the lesser trochanter of the left femur. In Sept of 2021, pt had a R CVA affecting her L side. Pt received OP neurological rehab at [facility] from Dec 2021 to April of 2022. Currently, pt denies L hip pain. Deficits include decresaed quality of gait c RW c a forward flexed trunk, valgus of the R knee, decreased step length bilat, and cicumduction of the L LE due to foot drop. Pt has a L AFO which she does not wear. Additionally, pt has weakness of her L extremities. 5xSTS and TUG tests reflect decreased quality of mobility. Pt will benefit from PT 2w6 to address deficits to optimize her functional mobility and safety. Pt was attended by her CNA, Rinaldo Cloudamela, who assists her with care 2 days a week.    Personal Factors and Comorbidities Past/Current Experience;Time since onset of injury/illness/exacerbation;Comorbidity 1;Age;Fitness    Comorbidities R CVA 2021    Examination-Activity Limitations Transfers;Locomotion Level;Bathing;Bed Mobility;Reach Overhead;Stairs;Stand     Examination-Participation Restrictions Other   ADLs   Stability/Clinical Decision Making Evolving/Moderate complexity    Clinical Decision Making Moderate    Rehab Potential Fair    PT Frequency 2x / week    PT Duration 6 weeks    PT Treatment/Interventions ADLs/Self Care Home Management;Neuromuscular re-education;Balance training;Therapeutic exercise;Therapeutic activities;Functional mobility training;Stair training;Gait training;Patient/family education;Passive range of motion;Taping    PT Next Visit Plan Complete FOTO. Initiate ther ex and HEP. Assess and address L hip pain as indicated.    Consulted and Agree with Plan of Care Patient             Patient will benefit from skilled therapeutic intervention in order to improve the following deficits and impairments:  Abnormal gait, Decreased coordination, Decreased range of motion, Difficulty walking, Impaired UE functional use, Decreased activity tolerance, Pain, Decreased strength, Postural dysfunction  Visit Diagnosis: Strain of muscle, fascia and tendon of left hip, initial encounter  Muscle weakness (generalized)  Unsteadiness on feet  Other abnormalities of gait and mobility  Difficulty  in walking, not elsewhere classified  Hemiplegia and hemiparesis following cerebral infarction affecting left non-dominant side (HCC)  Left foot drop  Other lack of coordination     Problem List Patient Active Problem List   Diagnosis Date Noted   Hip pain 03/21/2021   History of CVA (cerebrovascular accident) 03/21/2021   Spasticity 11/30/2020   Wheelchair dependence 10/07/2020   Left knee pain 07/26/2020   Cerebrovascular accident (CVA) of right basal ganglia (HCC) 07/26/2020   Left hemiparesis (HCC) 07/25/2020   Hypokalemia 07/20/2020   Right basal ganglia embolic stroke (HCC) 07/20/2020   HTN (hypertension) 07/20/2020   Hypothyroidism 07/20/2020    Joellyn Rued MS, PT 06/07/21 10:00 AM   [facility] [address] [place], [place], [address] Phone: [phone]   Fax:  [phone]  Name: Nicole [name] MRN: [id number] Date of Birth: 1930-03-07

## 2021-06-13 ENCOUNTER — Other Ambulatory Visit: Payer: Self-pay

## 2021-06-13 ENCOUNTER — Ambulatory Visit: Payer: Medicare Other

## 2021-06-13 DIAGNOSIS — R2681 Unsteadiness on feet: Secondary | ICD-10-CM

## 2021-06-13 DIAGNOSIS — R2689 Other abnormalities of gait and mobility: Secondary | ICD-10-CM

## 2021-06-13 DIAGNOSIS — R278 Other lack of coordination: Secondary | ICD-10-CM | POA: Diagnosis not present

## 2021-06-13 DIAGNOSIS — M6281 Muscle weakness (generalized): Secondary | ICD-10-CM | POA: Diagnosis not present

## 2021-06-13 DIAGNOSIS — R262 Difficulty in walking, not elsewhere classified: Secondary | ICD-10-CM

## 2021-06-13 DIAGNOSIS — S76012A Strain of muscle, fascia and tendon of left hip, initial encounter: Secondary | ICD-10-CM | POA: Diagnosis not present

## 2021-06-13 DIAGNOSIS — I69354 Hemiplegia and hemiparesis following cerebral infarction affecting left non-dominant side: Secondary | ICD-10-CM

## 2021-06-13 DIAGNOSIS — M21372 Foot drop, left foot: Secondary | ICD-10-CM | POA: Diagnosis not present

## 2021-06-13 NOTE — Therapy (Signed)
[facility] [address] [place], [place], [address] Phone: [phone]   Fax:  [phone]  Physical Therapy Treatment  Patient Details  Name: Nicole [name] MRN: [id number] Date of Birth: 01-19-1930 Referring Provider (PT): Dion Saucier D, [place]   Encounter Date: 06/13/2021   PT End of Session - 06/13/21 0956     Visit Number 2    Number of Visits 13    Date for PT Re-Evaluation 07/29/21    Authorization Type BCBS MEDICARE    PT Start Time 1000    PT Stop Time 1044    PT Time Calculation (min) 44 min    Equipment Utilized During Treatment Other (comment)   RW   Activity Tolerance Patient limited by fatigue    Behavior During Therapy [facility] for tasks assessed/performed             Past Medical History:  Diagnosis Date   Hypertension    Stroke [facility])    September 2021   Thyroid disease     No past surgical history on file.  There were no vitals filed for this visit.   Subjective Assessment - 06/13/21 0956     Subjective Pt presents to PT with Nicole [name] (CNA) for assistance with no current reports of L hip pain, but does note L knee pain after ambulation. She notes that most of her pain happens at night when her L LE is outstretched. Pt is ready to begin PT treatment at htis time.    Currently in Pain? Yes    Pain Score 7     Pain Location Knee    Pain Orientation Left                [facility] PT Assessment - 06/13/21 0001       Observation/Other Assessments   Focus on Therapeutic Outcomes (FOTO)  50% function; 61% predicted                           [facility] Adult PT Treatment/Exercise - 06/13/21 0001       Exercises   Exercises Knee/Hip      Knee/Hip Exercises: Stretches   Other Knee/Hip Stretches lower trunk rotations 2x10      Knee/Hip Exercises: Seated   Marching 2 sets;15 reps      Knee/Hip Exercises: Supine   Quad Sets Limitations 2x5 - 3 sec hold    Heel Slides 2 sets;10 reps;Left     Bridges 2 sets;10 reps                    PT Education - 06/13/21 1240     Education Details HEP    Person(s) Educated Patient;Caregiver(s)    Methods Explanation;Demonstration;Handout    Comprehension Verbalized understanding;Returned demonstration              PT Short Term Goals - 06/07/21 0942       PT SHORT TERM GOAL #1   Title Pt/CG will be Ind in an initial HEP    Status New    Target Date 06/28/21               PT Long Term Goals - 06/07/21 0945       PT LONG TERM GOAL #1   Title Pt will complete mat table t/f chair transfer with RW Indly for safe mobility at home    Status New    Target Date 07/29/21  PT LONG TERM GOAL #2   Title Pt will be able to walk Indly c RW 33ft for mobility within home    Status New    Target Date 07/29/21      PT LONG TERM GOAL #3   Title Pt will be able to amb 187ft c RW and SBA for community mobility    Status New    Target Date 07/29/21      PT LONG TERM GOAL #4   Title Patient will improve TUG to </= 40 seconds to demonstrate reduced fall risk and improved balance    Status New    Target Date 07/29/21      PT LONG TERM GOAL #5   Title Patient will improve 5xSTS to </= 25 seconds to demonstrate reduced fall risk and improved balance    Status New    Target Date 07/29/21      Additional Long Term Goals   Additional Long Term Goals Yes      PT LONG TERM GOAL #6   Title Pt/CG will be Ind in a final HEP to maintain achieved LOF    Status New    Target Date 07/29/21                   Plan - 06/13/21 1241     Clinical Impression Statement Pt was able to complete prescribed exercises with no adverse effect. She does have some L anterior hip discomfort when lying supine, but otherwise does not have too much reproducable pain. She ambulates more safely today with brace donned on distal L LE. Pt's FOTO score caputured and HEP created today. She continues to benefit from PT for LE  strengthening and reduction of pain to L LE and L proximal anterior hip.    PT Treatment/Interventions ADLs/Self Care Home Management;Neuromuscular re-education;Balance training;Therapeutic exercise;Therapeutic activities;Functional mobility training;Stair training;Gait training;Patient/family education;Passive range of motion;Taping    PT Next Visit Plan assess response to HEP; progress as able    PT Home Exercise Plan Access Code: [email or web address]             Patient will benefit from skilled therapeutic intervention in order to improve the following deficits and impairments:  Abnormal gait, Decreased coordination, Decreased range of motion, Difficulty walking, Impaired UE functional use, Decreased activity tolerance, Pain, Decreased strength, Postural dysfunction  Visit Diagnosis: Strain of muscle, fascia and tendon of left hip, initial encounter  Muscle weakness (generalized)  Unsteadiness on feet  Other abnormalities of gait and mobility  Difficulty in walking, not elsewhere classified  Hemiplegia and hemiparesis following cerebral infarction affecting left non-dominant side [facility])     Problem List Patient Active Problem List   Diagnosis Date Noted   Hip pain 03/21/2021   History of CVA (cerebrovascular accident) 03/21/2021   Spasticity 11/30/2020   Wheelchair dependence 10/07/2020   Left knee pain 07/26/2020   Cerebrovascular accident (CVA) of right basal ganglia (HCC) 07/26/2020   Left hemiparesis (HCC) 07/25/2020   Hypokalemia 07/20/2020   Right basal ganglia embolic stroke (HCC) 07/20/2020   HTN (hypertension) 07/20/2020   Hypothyroidism 07/20/2020    Eloy End, PT, DPT 06/13/21 12:45 PM  [facility] Outpatient Rehabilitation [facility] [address] [place], [place], [address] Phone: [phone]   Fax:  [phone]  Name: Nicole [name] MRN: [id number] Date of Birth: 1929-12-29

## 2021-06-15 ENCOUNTER — Other Ambulatory Visit: Payer: Self-pay

## 2021-06-15 ENCOUNTER — Ambulatory Visit: Payer: Medicare Other | Admitting: Physical Therapy

## 2021-06-15 ENCOUNTER — Encounter: Payer: Self-pay | Admitting: Physical Therapy

## 2021-06-15 DIAGNOSIS — R278 Other lack of coordination: Secondary | ICD-10-CM | POA: Diagnosis not present

## 2021-06-15 DIAGNOSIS — S76012A Strain of muscle, fascia and tendon of left hip, initial encounter: Secondary | ICD-10-CM | POA: Diagnosis not present

## 2021-06-15 DIAGNOSIS — R262 Difficulty in walking, not elsewhere classified: Secondary | ICD-10-CM | POA: Diagnosis not present

## 2021-06-15 DIAGNOSIS — M6281 Muscle weakness (generalized): Secondary | ICD-10-CM | POA: Diagnosis not present

## 2021-06-15 DIAGNOSIS — R2689 Other abnormalities of gait and mobility: Secondary | ICD-10-CM | POA: Diagnosis not present

## 2021-06-15 DIAGNOSIS — R2681 Unsteadiness on feet: Secondary | ICD-10-CM

## 2021-06-15 DIAGNOSIS — I69354 Hemiplegia and hemiparesis following cerebral infarction affecting left non-dominant side: Secondary | ICD-10-CM | POA: Diagnosis not present

## 2021-06-15 DIAGNOSIS — M21372 Foot drop, left foot: Secondary | ICD-10-CM | POA: Diagnosis not present

## 2021-06-15 NOTE — Therapy (Signed)
[facility] Outpatient Rehabilitation [facility] [address] [place], [place], [address] Phone: [phone]   Fax:  [phone]  Physical Therapy Treatment  Patient Details  Name: Nicole [name] MRN: [id number] Date of Birth: 03-26-1930 Referring Provider (PT): Dion Saucier D, [place]   Encounter Date: 06/15/2021   PT End of Session - 06/15/21 1131     Visit Number 3    Number of Visits 13    Date for PT Re-Evaluation 07/29/21    Authorization Type BCBS MEDICARE    PT Start Time 1130    PT Stop Time 1213    PT Time Calculation (min) 43 min    Equipment Utilized During Treatment Other (comment)   RW   Activity Tolerance Patient limited by fatigue    Behavior During Therapy [facility] for tasks assessed/performed             Past Medical History:  Diagnosis Date   Hypertension    Stroke [facility])    September 2021   Thyroid disease     History reviewed. No pertinent surgical history.  There were no vitals filed for this visit.   Subjective Assessment - 06/15/21 1144     Subjective Pt reports that she feels she is getting stronger and feels more confident in walking.  She reports 0/10 pain in her hip and knee today.                [facility] PT Assessment - 06/15/21 0001       AROM   Overall AROM Comments L knee ext lacking 20 degrees               [facility] Adult PT Treatment/Exercise - 06/15/21 0001       High [name] Balance   High [name] Balance Comments feet together on foam - 30'' x4      Exercises   Exercises Knee/Hip      Knee/Hip Exercises: Standing   Forward Step Up Limitations tap on 5'' step in // - 3x10 ea    Gait Training working on increasing hip flexion as compensation to clear foot during swing, using RW, 200'    Other Standing Knee Exercises hip abd in // 2x10 ea      Knee/Hip Exercises: Seated   Long Arc Quad Limitations 3x10 ea, 4# R knee      Knee/Hip Exercises: Supine   Quad Sets Limitations foot elevated - 20x                 PT Short Term Goals - 06/07/21 0942       PT SHORT TERM GOAL #1   Title Pt/CG will be Ind in an initial HEP    Status New    Target Date 06/28/21               PT Long Term Goals - 06/07/21 0945       PT LONG TERM GOAL #1   Title Pt will complete mat table t/f chair transfer with RW Indly for safe mobility at home    Status New    Target Date 07/29/21      PT LONG TERM GOAL #2   Title Pt will be able to walk Indly c RW 23ft for mobility within home    Status New    Target Date 07/29/21      PT LONG TERM GOAL #3   Title Pt will be able to amb 160ft c RW and SBA for community mobility  Status New    Target Date 07/29/21      PT LONG TERM GOAL #4   Title Patient will improve TUG to </= 40 seconds to demonstrate reduced fall risk and improved balance    Status New    Target Date 07/29/21      PT LONG TERM GOAL #5   Title Patient will improve 5xSTS to </= 25 seconds to demonstrate reduced fall risk and improved balance    Status New    Target Date 07/29/21      Additional Long Term Goals   Additional Long Term Goals Yes      PT LONG TERM GOAL #6   Title Pt/CG will be Ind in a final HEP to maintain achieved LOF    Status New    Target Date 07/29/21             Plan - 06/15/21 1318     Clinical Impression Statement Pt reports no increase in baseline pain following therapy  HEP was reviewed, but left unchanged    Overall, Nicole [name] is progressing well with therapy.  Today we concentrated on hip strengthening, normalizing gait, and balance/proprioception.  Pt tolerates session well with fatigue, but no increase in pain.  She does well with cuing for increased knee flexion to compensate for lack of clearance during swing during gait training.  Pt will continue to benefit from skilled physical therapy to address remaining deficits and achieve listed goals.  Continue per POC.    PT Treatment/Interventions ADLs/Self Care Home Management;Neuromuscular  re-education;Balance training;Therapeutic exercise;Therapeutic activities;Functional mobility training;Stair training;Gait training;Patient/family education;Passive range of motion;Taping    PT Next Visit Plan assess response to HEP; progress as able    PT Home Exercise Plan Access Code: [email or web address]              Patient will benefit from skilled therapeutic intervention in order to improve the following deficits and impairments:  Abnormal gait, Decreased coordination, Decreased range of motion, Difficulty walking, Impaired UE functional use, Decreased activity tolerance, Pain, Decreased strength, Postural dysfunction  Visit Diagnosis: Strain of muscle, fascia and tendon of left hip, initial encounter  Muscle weakness (generalized)  Unsteadiness on feet  Other abnormalities of gait and mobility  Difficulty in walking, not elsewhere classified  Hemiplegia and hemiparesis following cerebral infarction affecting left non-dominant side [facility])     Problem List Patient Active Problem List   Diagnosis Date Noted   Hip pain 03/21/2021   History of CVA (cerebrovascular accident) 03/21/2021   Spasticity 11/30/2020   Wheelchair dependence 10/07/2020   Left knee pain 07/26/2020   Cerebrovascular accident (CVA) of right basal ganglia (HCC) 07/26/2020   Left hemiparesis (HCC) 07/25/2020   Hypokalemia 07/20/2020   Right basal ganglia embolic stroke (HCC) 07/20/2020   HTN (hypertension) 07/20/2020   Hypothyroidism 07/20/2020    Alphonzo Severance PT, DPT 06/15/21 1:31 PM  [facility] Outpatient [facility] [address] [place], [place], [address] Phone: [phone]   Fax:  [phone]  Name: Nicole [name] MRN: [id number] Date of Birth: [birth year]

## 2021-06-19 DIAGNOSIS — I6350 Cerebral infarction due to unspecified occlusion or stenosis of unspecified cerebral artery: Secondary | ICD-10-CM | POA: Diagnosis not present

## 2021-06-20 ENCOUNTER — Other Ambulatory Visit: Payer: Self-pay

## 2021-06-20 ENCOUNTER — Ambulatory Visit: Payer: Medicare Other

## 2021-06-20 DIAGNOSIS — S76012A Strain of muscle, fascia and tendon of left hip, initial encounter: Secondary | ICD-10-CM | POA: Diagnosis not present

## 2021-06-20 DIAGNOSIS — M6281 Muscle weakness (generalized): Secondary | ICD-10-CM

## 2021-06-20 DIAGNOSIS — R278 Other lack of coordination: Secondary | ICD-10-CM

## 2021-06-20 DIAGNOSIS — I69354 Hemiplegia and hemiparesis following cerebral infarction affecting left non-dominant side: Secondary | ICD-10-CM

## 2021-06-20 DIAGNOSIS — R2681 Unsteadiness on feet: Secondary | ICD-10-CM | POA: Diagnosis not present

## 2021-06-20 DIAGNOSIS — M21372 Foot drop, left foot: Secondary | ICD-10-CM | POA: Diagnosis not present

## 2021-06-20 DIAGNOSIS — R2689 Other abnormalities of gait and mobility: Secondary | ICD-10-CM

## 2021-06-20 DIAGNOSIS — R262 Difficulty in walking, not elsewhere classified: Secondary | ICD-10-CM

## 2021-06-20 NOTE — Therapy (Signed)
[facility] Outpatient Rehabilitation [facility] [address] [place], [place], [address] Phone: [phone]   Fax:  [phone]  Physical Therapy Treatment  Patient Details  Name: Nicole [name] MRN: [id number] Date of Birth: 12-07-1929 Referring Provider (PT): Dion Saucier D, [place]   Encounter Date: 06/20/2021   PT End of Session - 06/20/21 1308     Visit Number 4    Number of Visits 13    Date for PT Re-Evaluation 07/29/21    Authorization Type BCBS MEDICARE    PT Start Time 1020    PT Stop Time 1104    PT Time Calculation (min) 44 min    Equipment Utilized During Treatment Other (comment)   RW   Behavior During Therapy [facility] for tasks assessed/performed             Past Medical History:  Diagnosis Date   Hypertension    Stroke [facility])    September 2021   Thyroid disease     History reviewed. No pertinent surgical history.  There were no vitals filed for this visit.   Subjective Assessment - 06/20/21 1030     Subjective Pt reports she twisted her L knee on Sunday and it was sore. Today she is having only L knee stiffness.    Patient Stated Goals To be able to walk better    Pain Score 0-No pain    Pain Location Knee    Pain Orientation Right                               [facility] Adult PT Treatment/Exercise - 06/20/21 0001       Ambulation/Gait   Gait Comments Advanced L LE without complete clearanceof l foot c AFO; walks c RW      Exercises   Exercises Knee/Hip      Knee/Hip Exercises: Standing   Hip Flexion Left;10 reps      Knee/Hip Exercises: Seated   Long Arc Quad Right;Left;2 sets;10 reps    Long Arc Quad Weight 3 lbs.   R, L-0lbs     Knee/Hip Exercises: Supine   Heel Slides 2 sets;10 reps;Left    Heel Slides Limitations slide under L foot    Bridges 2 sets;10 reps    Other Supine Knee/Hip Exercises Hook lying single leg clams, 10x2, red Tband                    PT Education - 06/20/21 1308      Education Details Updated HEP    Person(s) Educated Patient;Caregiver(s)    Methods Explanation;Demonstration;Tactile cues;Verbal cues;Handout    Comprehension Verbalized understanding;Returned demonstration;Verbal cues required;Tactile cues required              PT Short Term Goals - 06/07/21 0942       PT SHORT TERM GOAL #1   Title Pt/CG will be Ind in an initial HEP    Status New    Target Date 06/28/21               PT Long Term Goals - 06/07/21 0945       PT LONG TERM GOAL #1   Title Pt will complete mat table t/f chair transfer with RW Indly for safe mobility at home    Status New    Target Date 07/29/21      PT LONG TERM GOAL #2   Title Pt will be able to  walk Indly c RW 20ft for mobility within home    Status New    Target Date 07/29/21      PT LONG TERM GOAL #3   Title Pt will be able to amb 151ft c RW and SBA for community mobility    Status New    Target Date 07/29/21      PT LONG TERM GOAL #4   Title Patient will improve TUG to </= 40 seconds to demonstrate reduced fall risk and improved balance    Status New    Target Date 07/29/21      PT LONG TERM GOAL #5   Title Patient will improve 5xSTS to </= 25 seconds to demonstrate reduced fall risk and improved balance    Status New    Target Date 07/29/21      Additional Long Term Goals   Additional Long Term Goals Yes      PT LONG TERM GOAL #6   Title Pt/CG will be Ind in a final HEP to maintain achieved LOF    Status New    Target Date 07/29/21                   Plan - 06/20/21 1309     Clinical Impression Statement Pt reports no issue with L hip pain. Pt is wearing L AFO for ambulation outside of home. PT was completed for LE strengthening and to update her HEP. Pt/CG, [name], voiced understanding of the exs and pt completed properly. Pt's L hip AROM for flexion is limited, impacting her ability to adavance her L LE without complete clearance of the L foot. Pt will continue to  benefit from PT to address L LE/trunk strength to optimize her functional mobility    Personal Factors and Comorbidities Past/Current Experience;Time since onset of injury/illness/exacerbation;Comorbidity 1;Age;Fitness    Comorbidities R CVA 2021    Examination-Activity Limitations Transfers;Locomotion Level;Bathing;Bed Mobility;Reach Overhead;Stairs;Stand    Examination-Participation Restrictions Other   ADLs   Stability/Clinical Decision Making Evolving/Moderate complexity    Clinical Decision Making Moderate    Rehab Potential Fair    PT Frequency 2x / week    PT Duration 6 weeks    PT Treatment/Interventions ADLs/Self Care Home Management;Neuromuscular re-education;Balance training;Therapeutic exercise;Therapeutic activities;Functional mobility training;Stair training;Gait training;Patient/family education;Passive range of motion;Taping    PT Next Visit Plan assess response to HEP; progress as able    PT Home Exercise Plan Access Code: [email or web address]    Consulted and Agree with Plan of Care Patient             Patient will benefit from skilled therapeutic intervention in order to improve the following deficits and impairments:  Abnormal gait, Decreased coordination, Decreased range of motion, Difficulty walking, Impaired UE functional use, Decreased activity tolerance, Pain, Decreased strength, Postural dysfunction  Visit Diagnosis: Strain of muscle, fascia and tendon of left hip, initial encounter  Muscle weakness (generalized)  Unsteadiness on feet  Other abnormalities of gait and mobility  Difficulty in walking, not elsewhere classified  Hemiplegia and hemiparesis following cerebral infarction affecting left non-dominant side (HCC)  Left foot drop  Other lack of coordination     Problem List Patient Active Problem List   Diagnosis Date Noted   Hip pain 03/21/2021   History of CVA (cerebrovascular accident) 03/21/2021   Spasticity 11/30/2020   Wheelchair dependence  10/07/2020   Left knee pain 07/26/2020   Cerebrovascular accident (CVA) of right basal ganglia (HCC) 07/26/2020   Left hemiparesis (HCC) 07/25/2020  Hypokalemia 07/20/2020   Right basal ganglia embolic stroke (HCC) 07/20/2020   HTN (hypertension) 07/20/2020   Hypothyroidism 07/20/2020   Joellyn Rued MS, PT 06/20/21 1:24 PM   [facility] Outpatient [facility] [address] [place], [place], [address] Phone: [phone]   Fax:  [phone]  Name: Nicole [name] MRN: [id number] Date of Birth: 1930/06/23

## 2021-06-23 ENCOUNTER — Other Ambulatory Visit: Payer: Self-pay

## 2021-06-23 ENCOUNTER — Ambulatory Visit: Payer: Medicare Other

## 2021-06-23 DIAGNOSIS — M21372 Foot drop, left foot: Secondary | ICD-10-CM | POA: Diagnosis not present

## 2021-06-23 DIAGNOSIS — M6281 Muscle weakness (generalized): Secondary | ICD-10-CM

## 2021-06-23 DIAGNOSIS — R262 Difficulty in walking, not elsewhere classified: Secondary | ICD-10-CM

## 2021-06-23 DIAGNOSIS — R278 Other lack of coordination: Secondary | ICD-10-CM

## 2021-06-23 DIAGNOSIS — I69354 Hemiplegia and hemiparesis following cerebral infarction affecting left non-dominant side: Secondary | ICD-10-CM

## 2021-06-23 DIAGNOSIS — R2681 Unsteadiness on feet: Secondary | ICD-10-CM | POA: Diagnosis not present

## 2021-06-23 DIAGNOSIS — R2689 Other abnormalities of gait and mobility: Secondary | ICD-10-CM

## 2021-06-23 DIAGNOSIS — S76012A Strain of muscle, fascia and tendon of left hip, initial encounter: Secondary | ICD-10-CM | POA: Diagnosis not present

## 2021-06-23 NOTE — Therapy (Signed)
[facility] [address] [place], [place], [address] Phone: [phone]   Fax:  [phone]  Physical Therapy Treatment  Patient Details  Name: Nicole [name] MRN: [id number] Date of Birth: 06-Aug-1930 Referring Provider (PT): Dion Saucier D, [place]   Encounter Date: 06/23/2021   PT End of Session - 06/23/21 1050     Visit Number 5    Number of Visits 13    Date for PT Re-Evaluation 07/29/21    Authorization Type BCBS MEDICARE    PT Start Time 1020    PT Stop Time 1100    PT Time Calculation (min) 40 min    Equipment Utilized During Treatment Other (comment)   RW   Activity Tolerance Patient limited by fatigue    Behavior During Therapy [facility] for tasks assessed/performed             Past Medical History:  Diagnosis Date   Hypertension    Stroke [facility])    September 2021   Thyroid disease     No past surgical history on file.  There were no vitals filed for this visit.   Subjective Assessment - 06/23/21 1022     Subjective Pt reports she is doing OK today. She notes she is doing some exs everyday. Pt requested to attend PT 1x per week since she is doing her exs at home.    Pertinent History R CVA Sept 2021    Diagnostic tests 03/21/21- CT L hip  IMPRESSION:  Acute full-thickness tear/avulsion of the distal iliopsoas tendon  from its insertion on the lesser trochanter of the left femur. Mild  disruption of the lesser trochanter bony cortex, with preservation  of the majority of bony architecture. No evidence of underlying bony  lesion in the left femur. Reactive joint effusion, iliopsoas  bursitis, edema in the adductors, and edema in the gluteal  musculature.     Mild to moderate left hip osteoarthritis with degenerative superior  labral tearing.     Lower lumbar spine and left sacroiliac joint degenerative change. 03/20/21- Xray L knee IMPRESSION:  1. No acute osseous abnormality, left knee.  2. Moderate tricompartmental  osteoarthritis.    Patient Stated Goals To be able to walk better    Currently in Pain? Yes    Pain Score 0-No pain    Pain Location Knee    Pain Orientation Right                [facility] PT Assessment - 06/23/21 0001       Timed Up and Go Test   Normal TUG (seconds) 42   40, 44                          [facility] Adult PT Treatment/Exercise - 06/23/21 0001       Exercises   Exercises Knee/Hip      Knee/Hip Exercises: Aerobic   Nustep , L5, UEs/LEs      Knee/Hip Exercises: Seated   Long Arc Quad Right;Left;15 reps    Long Arc Quad Weight 3 lbs.   R, L-0lbs   Hamstring Curl Right;Left;2 sets;10 reps    Hamstring Limitations manual resistance    Sit to Sand 2 sets;5 reps      Knee/Hip Exercises: Supine   Heel Slides 2 sets;10 reps;Left                    PT Education - 06/23/21 1334  Education Details Updated HEP    Person(s) Educated Patient    Methods Explanation;Demonstration;Tactile cues;Verbal cues    Comprehension Verbalized understanding;Returned demonstration;Verbal cues required;Tactile cues required              PT Short Term Goals - 06/07/21 0942       PT SHORT TERM GOAL #1   Title Pt/CG will be Ind in an initial HEP    Status New    Target Date 06/28/21               PT Long Term Goals - 06/07/21 0945       PT LONG TERM GOAL #1   Title Pt will complete mat table t/f chair transfer with RW Indly for safe mobility at home    Status New    Target Date 07/29/21      PT LONG TERM GOAL #2   Title Pt will be able to walk Indly c RW 32ft for mobility within home    Status New    Target Date 07/29/21      PT LONG TERM GOAL #3   Title Pt will be able to amb 138ft c RW and SBA for community mobility    Status New    Target Date 07/29/21      PT LONG TERM GOAL #4   Title Patient will improve TUG to </= 40 seconds to demonstrate reduced fall risk and improved balance    Status New    Target Date 07/29/21       PT LONG TERM GOAL #5   Title Patient will improve 5xSTS to </= 25 seconds to demonstrate reduced fall risk and improved balance    Status New    Target Date 07/29/21      Additional Long Term Goals   Additional Long Term Goals Yes      PT LONG TERM GOAL #6   Title Pt/CG will be Ind in a final HEP to maintain achieved LOF    Status New    Target Date 07/29/21                   Plan - 06/23/21 1051     Clinical Impression Statement PT was continued for LE strengthening and activity tolerance. The NuStep was started today. Reassessment found the pt's TUG time was improved by 12 sec which is consistent with this PTs observation of the pt's walking pace being improved. PT will be decreased to 1x per week per pt's request since she is completing her exs at home. Pt will continue to benefit from PT to improve pt's strength, balance, and activity tolerance to optimize her functional mobility.    Personal Factors and Comorbidities Past/Current Experience;Time since onset of injury/illness/exacerbation;Comorbidity 1;Age;Fitness    Comorbidities R CVA 2021    Examination-Activity Limitations Transfers;Locomotion Level;Bathing;Bed Mobility;Reach Overhead;Stairs;Stand    Examination-Participation Restrictions Other    Stability/Clinical Decision Making Evolving/Moderate complexity    Clinical Decision Making Moderate    Rehab Potential Fair    PT Frequency 2x / week    PT Duration 6 weeks    PT Treatment/Interventions ADLs/Self Care Home Management;Neuromuscular re-education;Balance training;Therapeutic exercise;Therapeutic activities;Functional mobility training;Stair training;Gait training;Patient/family education;Passive range of motion;Taping    PT Next Visit Plan assess response to HEP; progress as able    PT Home Exercise Plan Access Code: [email or web address]    Consulted and Agree with Plan of Care Patient             Patient  will benefit from skilled therapeutic intervention in  order to improve the following deficits and impairments:  Abnormal gait, Decreased coordination, Decreased range of motion, Difficulty walking, Impaired UE functional use, Decreased activity tolerance, Pain, Decreased strength, Postural dysfunction  Visit Diagnosis: Muscle weakness (generalized)  Unsteadiness on feet  Other abnormalities of gait and mobility  Difficulty in walking, not elsewhere classified  Hemiplegia and hemiparesis following cerebral infarction affecting left non-dominant side (HCC)  Left foot drop  Other lack of coordination     Problem List Patient Active Problem List   Diagnosis Date Noted   Hip pain 03/21/2021   History of CVA (cerebrovascular accident) 03/21/2021   Spasticity 11/30/2020   Wheelchair dependence 10/07/2020   Left knee pain 07/26/2020   Cerebrovascular accident (CVA) of right basal ganglia (HCC) 07/26/2020   Left hemiparesis (HCC) 07/25/2020   Hypokalemia 07/20/2020   Right basal ganglia embolic stroke (HCC) 07/20/2020   HTN (hypertension) 07/20/2020   Hypothyroidism 07/20/2020    Joellyn Rued MS, PT 06/23/21 1:44 PM   [facility] [address] [place], [place], [address] Phone: [phone]   Fax:  [phone]  Name: SARAH-JANE NAZARIO MRN: [id number] Date of Birth: 1930-05-07

## 2021-06-27 ENCOUNTER — Ambulatory Visit: Payer: Medicare Other

## 2021-06-27 ENCOUNTER — Other Ambulatory Visit: Payer: Self-pay

## 2021-06-27 DIAGNOSIS — M21372 Foot drop, left foot: Secondary | ICD-10-CM

## 2021-06-27 DIAGNOSIS — S76012A Strain of muscle, fascia and tendon of left hip, initial encounter: Secondary | ICD-10-CM | POA: Diagnosis not present

## 2021-06-27 DIAGNOSIS — R2689 Other abnormalities of gait and mobility: Secondary | ICD-10-CM

## 2021-06-27 DIAGNOSIS — M6281 Muscle weakness (generalized): Secondary | ICD-10-CM

## 2021-06-27 DIAGNOSIS — R262 Difficulty in walking, not elsewhere classified: Secondary | ICD-10-CM | POA: Diagnosis not present

## 2021-06-27 DIAGNOSIS — R2681 Unsteadiness on feet: Secondary | ICD-10-CM | POA: Diagnosis not present

## 2021-06-27 DIAGNOSIS — I69354 Hemiplegia and hemiparesis following cerebral infarction affecting left non-dominant side: Secondary | ICD-10-CM | POA: Diagnosis not present

## 2021-06-27 DIAGNOSIS — R278 Other lack of coordination: Secondary | ICD-10-CM

## 2021-06-27 NOTE — Therapy (Signed)
[facility] [address] [place], [place], [address] Phone: [phone]   Fax:  [phone]  Physical Therapy Treatment  Patient Details  Name: Nicole [name] MRN: [id number] Date of Birth: 1930/01/04 Referring Provider (PT): Dion Saucier D, [place]   Encounter Date: 06/27/2021   PT End of Session - 06/27/21 1001     Visit Number 6    Number of Visits 13    Date for PT Re-Evaluation 07/29/21    Authorization Type BCBS MEDICARE    PT Start Time 0935    PT Stop Time 1015    PT Time Calculation (min) 40 min    Equipment Utilized During Treatment Other (comment)    Activity Tolerance Patient limited by fatigue    Behavior During Therapy [facility] for tasks assessed/performed             Past Medical History:  Diagnosis Date   Hypertension    Stroke [facility])    September 2021   Thyroid disease     History reviewed. No pertinent surgical history.  There were no vitals filed for this visit.   Subjective Assessment - 06/27/21 0955     Subjective Pt reports doing well without any concerns.    Patient Stated Goals To be able to walk better    Currently in Pain? No/denies    Pain Score 0-No pain    Pain Location Knee    Pain Orientation Right                               [facility] Adult PT Treatment/Exercise - 06/27/21 0001       Exercises   Exercises Knee/Hip      Knee/Hip Exercises: Aerobic   Nustep , L5, LEs      Knee/Hip Exercises: Seated   Long Arc Quad Right;Left;15 reps    Long Arc Quad Weight 3 lbs.   R, L-0lbs   Sit to [place] 5 reps      Knee/Hip Exercises: Supine   Heel Slides 2 sets;10 reps;Left    Heel Slides Limitations slide under L foot    Bridges 2 sets;10 reps    Other Supine Knee/Hip Exercises Hook lying single leg clams, 10x2, red Tband                      PT Short Term Goals - 06/07/21 0942       PT SHORT TERM GOAL #1   Title Pt/CG will be Ind in an initial  HEP    Status New    Target Date 06/28/21               PT Long Term Goals - 06/07/21 0945       PT LONG TERM GOAL #1   Title Pt will complete mat table t/f chair transfer with RW Indly for safe mobility at home    Status New    Target Date 07/29/21      PT LONG TERM GOAL #2   Title Pt will be able to walk Indly c RW 37ft for mobility within home    Status New    Target Date 07/29/21      PT LONG TERM GOAL #3   Title Pt will be able to amb 180ft c RW and SBA for community mobility    Status New    Target Date 07/29/21      PT  LONG TERM GOAL #4   Title Patient will improve TUG to </= 40 seconds to demonstrate reduced fall risk and improved balance    Status New    Target Date 07/29/21      PT LONG TERM GOAL #5   Title Patient will improve 5xSTS to </= 25 seconds to demonstrate reduced fall risk and improved balance    Status New    Target Date 07/29/21      Additional Long Term Goals   Additional Long Term Goals Yes      PT LONG TERM GOAL #6   Title Pt/CG will be Ind in a final HEP to maintain achieved LOF    Status New    Target Date 07/29/21                   Plan - 06/27/21 1005     Clinical Impression Statement Pt participated in PT to address LE strength and functional mobility. Pt completed the session with good effort and tolerated without adverse effects. Pt is to continue with PT 1x/week per pt's request.    Personal Factors and Comorbidities Past/Current Experience;Time since onset of injury/illness/exacerbation;Comorbidity 1;Age;Fitness    Comorbidities R CVA 2021    Examination-Activity Limitations Transfers;Locomotion Level;Bathing;Bed Mobility;Reach Overhead;Stairs;Stand    Examination-Participation Restrictions Other    Stability/Clinical Decision Making Evolving/Moderate complexity    Clinical Decision Making Moderate    Rehab Potential Fair    PT Frequency 2x / week    PT Duration 6 weeks    PT Treatment/Interventions ADLs/Self  Care Home Management;Neuromuscular re-education;Balance training;Therapeutic exercise;Therapeutic activities;Functional mobility training;Stair training;Gait training;Patient/family education;Passive range of motion;Taping    PT Next Visit Plan assess response to HEP; progress as able    PT Home Exercise Plan Access Code: [email or web address]    Consulted and Agree with Plan of Care Patient             Patient will benefit from skilled therapeutic intervention in order to improve the following deficits and impairments:  Abnormal gait, Decreased coordination, Decreased range of motion, Difficulty walking, Impaired UE functional use, Decreased activity tolerance, Pain, Decreased strength, Postural dysfunction  Visit Diagnosis: Muscle weakness (generalized)  Unsteadiness on feet  Other abnormalities of gait and mobility  Difficulty in walking, not elsewhere classified  Left foot drop  Other lack of coordination     Problem List Patient Active Problem List   Diagnosis Date Noted   Hip pain 03/21/2021   History of CVA (cerebrovascular accident) 03/21/2021   Spasticity 11/30/2020   Wheelchair dependence 10/07/2020   Left knee pain 07/26/2020   Cerebrovascular accident (CVA) of right basal ganglia (HCC) 07/26/2020   Left hemiparesis (HCC) 07/25/2020   Hypokalemia 07/20/2020   Right basal ganglia embolic stroke (HCC) 07/20/2020   HTN (hypertension) 07/20/2020   Hypothyroidism 07/20/2020    Joellyn Rued MS, PT 06/27/21 5:36 PM   [facility] [address] [place], [place], [address] Phone: [phone]   Fax:  [phone]  Name: Nicole [name] MRN: [id number] Date of Birth: [birth year]

## 2021-06-29 ENCOUNTER — Ambulatory Visit: Payer: Medicare Other

## 2021-07-04 ENCOUNTER — Ambulatory Visit: Payer: Medicare Other

## 2021-07-06 ENCOUNTER — Ambulatory Visit: Payer: Medicare Other

## 2021-07-11 ENCOUNTER — Ambulatory Visit: Payer: Medicare Other | Attending: Internal Medicine

## 2021-07-11 ENCOUNTER — Other Ambulatory Visit: Payer: Self-pay

## 2021-07-11 DIAGNOSIS — R262 Difficulty in walking, not elsewhere classified: Secondary | ICD-10-CM

## 2021-07-11 DIAGNOSIS — R2689 Other abnormalities of gait and mobility: Secondary | ICD-10-CM | POA: Diagnosis not present

## 2021-07-11 DIAGNOSIS — M21372 Foot drop, left foot: Secondary | ICD-10-CM | POA: Diagnosis not present

## 2021-07-11 DIAGNOSIS — M6281 Muscle weakness (generalized): Secondary | ICD-10-CM

## 2021-07-11 DIAGNOSIS — R2681 Unsteadiness on feet: Secondary | ICD-10-CM | POA: Diagnosis not present

## 2021-07-11 DIAGNOSIS — S76012A Strain of muscle, fascia and tendon of left hip, initial encounter: Secondary | ICD-10-CM | POA: Insufficient documentation

## 2021-07-11 DIAGNOSIS — I69354 Hemiplegia and hemiparesis following cerebral infarction affecting left non-dominant side: Secondary | ICD-10-CM

## 2021-07-11 DIAGNOSIS — R278 Other lack of coordination: Secondary | ICD-10-CM

## 2021-07-11 NOTE — Therapy (Signed)
[facility] [address] [place], [place], [address] Phone: [phone]   Fax:  [phone]  Physical Therapy Evaluation  Patient Details  Name: Nicole [name] MRN: [id number] Date of Birth: Nov 13, 1929 Referring Provider (PT): Dion Saucier D, [place]   Encounter Date: 07/11/2021   PT End of Session - 07/11/21 1357     Visit Number 7    Number of Visits 13    Date for PT Re-Evaluation 07/29/21    Authorization Type BCBS MEDICARE    PT Start Time 1019    PT Stop Time 1102    PT Time Calculation (min) 43 min    Equipment Utilized During Treatment Other (comment)   RW, L AFO   Activity Tolerance Patient limited by fatigue    Behavior During Therapy [facility] for tasks assessed/performed             Past Medical History:  Diagnosis Date   Hypertension    Stroke [facility])    September 2021   Thyroid disease     History reviewed. No pertinent surgical history.  There were no vitals filed for this visit.    Subjective Assessment - 07/11/21 1355     Subjective Pt reports she is doing well today.    Pertinent History R CVA Sept 2021    Diagnostic tests 03/21/21- CT L hip  IMPRESSION:  Acute full-thickness tear/avulsion of the distal iliopsoas tendon  from its insertion on the lesser trochanter of the left femur. Mild  disruption of the lesser trochanter bony cortex, with preservation  of the majority of bony architecture. No evidence of underlying bony  lesion in the left femur. Reactive joint effusion, iliopsoas  bursitis, edema in the adductors, and edema in the gluteal  musculature.     Mild to moderate left hip osteoarthritis with degenerative superior  labral tearing.     Lower lumbar spine and left sacroiliac joint degenerative change. 03/20/21- Xray L knee IMPRESSION:  1. No acute osseous abnormality, left knee.  2. Moderate tricompartmental osteoarthritis.    Patient Stated Goals To be able to walk better    Currently in Pain?  No/denies    Pain Score 0-No pain    Pain Location Knee    Pain Orientation Right                            Objective measurements completed on examination: See above findings.       [facility] [place] [facility] Adult PT Treatment/Exercise - 07/11/21 0001       Exercises   Exercises Knee/Hip      Knee/Hip Exercises: Seated   Long Arc Quad Right;Left;15 reps    Long Arc Quad Weight 3 lbs.   R, L-0lbs   Marching Right;Left;10 reps    Sit to [employer] 2 sets;5 reps      Knee/Hip Exercises: Supine   Heel Slides 2 sets;10 reps;Left    Heel Slides Limitations slide under L foot    Bridges 2 sets;10 reps    Other Supine Knee/Hip Exercises Hook lying clams, 10x2, red Tband                    PT Education - 07/11/21 1356     Education Details Reviewed HEP with daughter    Person(s) Educated Patient;Child(ren)    Methods Explanation;Demonstration;Tactile cues    Comprehension Verbalized understanding;Returned demonstration;Verbal cues required;Tactile cues required  PT Short Term Goals - 07/11/21 1400       PT SHORT TERM GOAL #1   Title Pt/CG will be Ind in an initial HEP with assist from daughter    Status Achieved    Target Date 07/11/21               PT Long Term Goals - 06/07/21 0945       PT LONG TERM GOAL #1   Title Pt will complete mat table t/f chair transfer with RW Indly for safe mobility at home    Status New    Target Date 07/29/21      PT LONG TERM GOAL #2   Title Pt will be able to walk Indly c RW 79ft for mobility within home    Status New    Target Date 07/29/21      PT LONG TERM GOAL #3   Title Pt will be able to amb 15ft c RW and SBA for community mobility    Status New    Target Date 07/29/21      PT LONG TERM GOAL #4   Title Patient will improve TUG to </= 40 seconds to demonstrate reduced fall risk and improved balance    Status New    Target Date 07/29/21      PT LONG TERM GOAL #5   Title Patient will  improve 5xSTS to </= 25 seconds to demonstrate reduced fall risk and improved balance    Status New    Target Date 07/29/21      Additional Long Term Goals   Additional Long Term Goals Yes      PT LONG TERM GOAL #6   Title Pt/CG will be Ind in a final HEP to maintain achieved LOF    Status New    Target Date 07/29/21                    Plan - 07/11/21 1358     Clinical Impression Statement Pt came to PT with her daughter today, The HEP was reviewed with daughter as the pt completed the exs. A written program was provided to the daughter. Recommended the pt completing her HEP 3-4x per week. Pt tolerated the strengthening exs without adverse effects. Pt will continue to benefit from skilled PT increase strength to otpimize balance and function.    Personal Factors and Comorbidities Past/Current Experience;Time since onset of injury/illness/exacerbation;Comorbidity 1;Age;Fitness    Comorbidities R CVA 2021    Examination-Activity Limitations Transfers;Locomotion Level;Bathing;Bed Mobility;Reach Overhead;Stairs;Stand    Examination-Participation Restrictions Other    Stability/Clinical Decision Making Evolving/Moderate complexity    Clinical Decision Making Moderate    Rehab Potential Fair    PT Frequency 2x / week    PT Duration 6 weeks    PT Treatment/Interventions ADLs/Self Care Home Management;Neuromuscular re-education;Balance training;Therapeutic exercise;Therapeutic activities;Functional mobility training;Stair training;Gait training;Patient/family education;Passive range of motion;Taping    PT Next Visit Plan assess response to HEP; progress as able    PT Home Exercise Plan Access Code: [email or web address]    Consulted and Agree with Plan of Care Patient;Family member/caregiver    Family Member Consulted Daughter             Patient will benefit from skilled therapeutic intervention in order to improve the following deficits and impairments:  Abnormal gait, Decreased  coordination, Decreased range of motion, Difficulty walking, Impaired UE functional use, Decreased activity tolerance, Pain, Decreased strength, Postural dysfunction  Visit Diagnosis: Muscle weakness (generalized)  Unsteadiness on feet  Other abnormalities of gait and mobility  Difficulty in walking, not elsewhere classified  Left foot drop  Other lack of coordination  Hemiplegia and hemiparesis following cerebral infarction affecting left non-dominant side [facility])     Problem List Patient Active Problem List   Diagnosis Date Noted   Hip pain 03/21/2021   History of CVA (cerebrovascular accident) 03/21/2021   Spasticity 11/30/2020   Wheelchair dependence 10/07/2020   Left knee pain 07/26/2020   Cerebrovascular accident (CVA) of right basal ganglia (HCC) 07/26/2020   Left hemiparesis (HCC) 07/25/2020   Hypokalemia 07/20/2020   Right basal ganglia embolic stroke (HCC) 07/20/2020   HTN (hypertension) 07/20/2020   Hypothyroidism 07/20/2020   Joellyn Rued MS, PT 07/11/21 2:24 PM   [facility] [place] [facility] [address] [place], [place], [address] Phone: [phone]   Fax:  [phone]  Name: DEANDRIA KLUTE MRN: [id number] Date of Birth: [birth year]

## 2021-07-13 ENCOUNTER — Ambulatory Visit: Payer: Medicare Other

## 2021-07-20 ENCOUNTER — Ambulatory Visit: Payer: Medicare Other

## 2021-07-20 ENCOUNTER — Other Ambulatory Visit: Payer: Self-pay

## 2021-07-20 DIAGNOSIS — I6350 Cerebral infarction due to unspecified occlusion or stenosis of unspecified cerebral artery: Secondary | ICD-10-CM | POA: Diagnosis not present

## 2021-07-20 DIAGNOSIS — R2681 Unsteadiness on feet: Secondary | ICD-10-CM

## 2021-07-20 DIAGNOSIS — I69354 Hemiplegia and hemiparesis following cerebral infarction affecting left non-dominant side: Secondary | ICD-10-CM

## 2021-07-20 DIAGNOSIS — R278 Other lack of coordination: Secondary | ICD-10-CM

## 2021-07-20 DIAGNOSIS — S76012A Strain of muscle, fascia and tendon of left hip, initial encounter: Secondary | ICD-10-CM | POA: Diagnosis not present

## 2021-07-20 DIAGNOSIS — R262 Difficulty in walking, not elsewhere classified: Secondary | ICD-10-CM | POA: Diagnosis not present

## 2021-07-20 DIAGNOSIS — M21372 Foot drop, left foot: Secondary | ICD-10-CM

## 2021-07-20 DIAGNOSIS — R2689 Other abnormalities of gait and mobility: Secondary | ICD-10-CM

## 2021-07-20 DIAGNOSIS — M6281 Muscle weakness (generalized): Secondary | ICD-10-CM | POA: Diagnosis not present

## 2021-07-20 NOTE — Therapy (Signed)
[facility] [address] [place], [place], [address] Phone: [phone]   Fax:  [phone]  Physical Therapy Treatment  Patient Details  Name: Nicole [name] MRN: [id number] Date of Birth: May 07, 1930 Referring Provider (PT): Dion Saucier D, [place]   Encounter Date: 07/20/2021   PT End of Session - 07/20/21 1335     Visit Number 8    Number of Visits 13    Date for PT Re-Evaluation 07/29/21    Authorization Type BCBS MEDICARE    PT Start Time 1334    PT Stop Time 1415    PT Time Calculation (min) 41 min    Equipment Utilized During Treatment Other (comment)   RW   Activity Tolerance Patient limited by fatigue    Behavior During Therapy [facility] for tasks assessed/performed             Past Medical History:  Diagnosis Date   Hypertension    Stroke [facility])    September 2021   Thyroid disease     History reviewed. No pertinent surgical history.  There were no vitals filed for this visit.   Subjective Assessment - 07/20/21 1358     Subjective Pt offered no concerns.    Patient Stated Goals To be able to walk better    Currently in Pain? No/denies                [facility] PT Assessment - 07/20/21 0001       Transfers   Five time sit to stand comments  18.8      Timed Up and Go Test   Normal TUG (seconds) 36.3   c RW                          [facility] Adult PT Treatment/Exercise - 07/20/21 0001       Knee/Hip Exercises: Standing   Knee Flexion Right;Left;10 reps    Hip Abduction Right;Left;10 reps    Abduction Limitations counter support      Knee/Hip Exercises: Seated   Long Arc Quad Right;Left;2 sets;10 reps    Long Arc Quad Weight 3 lbs.   R, L-0lbs   Sit to Bier 2 sets;5 reps      Knee/Hip Exercises: Supine   Bridges 2 sets;10 reps    Other Supine Knee/Hip Exercises Hook lying clams, 10x2, red Tband    Other Supine Knee/Hip Exercises Marching, 20x2                        PT Short Term Goals - 07/11/21 1400       PT SHORT TERM GOAL #1   Title Pt/CG will be Ind in an initial HEP with assist from daughter    Status Achieved    Target Date 07/11/21               PT Long Term Goals - 07/20/21 1342       PT LONG TERM GOAL #4   Title Patient will improve TUG to </= 40 seconds to demonstrate reduced fall risk and improved balance.07/20/21:36.3 c RW    Baseline 56.8 sce c RW    Status New    Target Date 07/20/21      PT LONG TERM GOAL #5   Title Patient will improve 5xSTS to </= 25 seconds to demonstrate reduced fall risk and improved balance. 07/20/21: 18.8 sec    Baseline 37.4 sec  Status Achieved    Target Date 07/20/21                   Plan - 07/20/21 1359     Clinical Impression Statement PT today reassessed pt's 5xSTS and TUG walking times. Both were found to be much improved over the initial eval values. PT continued to address LE strength and balance. With ambulation with the RW, the pt's gait pattern is improved with better L foot clearance when advancing it.    Personal Factors and Comorbidities Past/Current Experience;Time since onset of injury/illness/exacerbation;Comorbidity 1;Age;Fitness    Comorbidities R CVA 2021    Examination-Activity Limitations Transfers;Locomotion Level;Bathing;Bed Mobility;Reach Overhead;Stairs;Stand    Examination-Participation Restrictions Other    Stability/Clinical Decision Making Evolving/Moderate complexity    Clinical Decision Making Moderate    Rehab Potential Fair    PT Frequency 2x / week    PT Duration 6 weeks    PT Treatment/Interventions ADLs/Self Care Home Management;Neuromuscular re-education;Balance training;Therapeutic exercise;Therapeutic activities;Functional mobility training;Stair training;Gait training;Patient/family education;Passive range of motion;Taping    PT Next Visit Plan assess response to HEP; progress as able. Assess FOTO and other functional measures and review HEP.  Possible DC with next PT appt.    PT Home Exercise Plan Access Code: [email or web address]    Consulted and Agree with Plan of Care Patient;Family member/caregiver    Family Member Consulted CG-Pam             Patient will benefit from skilled therapeutic intervention in order to improve the following deficits and impairments:  Abnormal gait, Decreased coordination, Decreased range of motion, Difficulty walking, Impaired UE functional use, Decreased activity tolerance, Pain, Decreased strength, Postural dysfunction  Visit Diagnosis: Muscle weakness (generalized)  Unsteadiness on feet  Other abnormalities of gait and mobility  Difficulty in walking, not elsewhere classified  Left foot drop  Other lack of coordination  Hemiplegia and hemiparesis following cerebral infarction affecting left non-dominant side (HCC)  Strain of muscle, fascia and tendon of left hip, initial encounter     Problem List Patient Active Problem List   Diagnosis Date Noted   Hip pain 03/21/2021   History of CVA (cerebrovascular accident) 03/21/2021   Spasticity 11/30/2020   Wheelchair dependence 10/07/2020   Left knee pain 07/26/2020   Cerebrovascular accident (CVA) of right basal ganglia (HCC) 07/26/2020   Left hemiparesis (HCC) 07/25/2020   Hypokalemia 07/20/2020   Right basal ganglia embolic stroke (HCC) 07/20/2020   HTN (hypertension) 07/20/2020   Hypothyroidism 07/20/2020    Joellyn Rued, PT 07/20/2021, 3:24 PM  [facility] [address] [place], [place], [address] Phone: [phone]   Fax:  [phone]  Name: Nicole [name] MRN: [id number] Date of Birth: 29-Jul-1930

## 2021-07-26 ENCOUNTER — Other Ambulatory Visit: Payer: Self-pay

## 2021-07-26 ENCOUNTER — Ambulatory Visit: Payer: Medicare Other

## 2021-07-26 DIAGNOSIS — M6281 Muscle weakness (generalized): Secondary | ICD-10-CM | POA: Diagnosis not present

## 2021-07-26 DIAGNOSIS — R278 Other lack of coordination: Secondary | ICD-10-CM

## 2021-07-26 DIAGNOSIS — M21372 Foot drop, left foot: Secondary | ICD-10-CM

## 2021-07-26 DIAGNOSIS — R2681 Unsteadiness on feet: Secondary | ICD-10-CM | POA: Diagnosis not present

## 2021-07-26 DIAGNOSIS — I69354 Hemiplegia and hemiparesis following cerebral infarction affecting left non-dominant side: Secondary | ICD-10-CM

## 2021-07-26 DIAGNOSIS — R2689 Other abnormalities of gait and mobility: Secondary | ICD-10-CM

## 2021-07-26 DIAGNOSIS — R262 Difficulty in walking, not elsewhere classified: Secondary | ICD-10-CM

## 2021-07-26 DIAGNOSIS — S76012A Strain of muscle, fascia and tendon of left hip, initial encounter: Secondary | ICD-10-CM

## 2021-07-26 NOTE — Therapy (Signed)
[place], [place], [address] Phone: [phone]   Fax:  [phone]  Physical Therapy Treatment/Discharge  Patient Details  Name: Nicole [name] MRN: [id number] Date of Birth: Jan 09, 1930 Referring Provider (PT): Jonelle Sidle D, [place]   Encounter Date: 07/26/2021   PT End of Session - 07/26/21 1141     Visit Number 9    Number of Visits 13    Date for PT Re-Evaluation 07/29/21    Authorization Type BCBS MEDICARE    PT Start Time 1108    PT Stop Time 1150    PT Time Calculation (min) 42 min    Equipment Utilized During Treatment Other (comment)   RW   Activity Tolerance Patient tolerated treatment well    Behavior During Therapy [facility] for tasks assessed/performed             Past Medical History:  Diagnosis Date   Hypertension    Stroke [facility])    September 2021   Thyroid disease     History reviewed. No pertinent surgical history.  There were no vitals filed for this visit.   Subjective Assessment - 07/26/21 1139     Subjective Pt reports she is doing well.    Pertinent History R CVA Sept 2021    Limitations Standing;Walking;House hold activities    Diagnostic tests 03/21/21- CT L hip  IMPRESSION:  Acute full-thickness tear/avulsion of the distal iliopsoas tendon  from its insertion on the lesser trochanter of the left femur. Mild  disruption of the lesser trochanter bony cortex, with preservation  of the majority of bony architecture. No evidence of underlying bony  lesion in the left femur. Reactive joint effusion, iliopsoas  bursitis, edema in the adductors, and edema in the gluteal  musculature.     Mild to moderate left hip osteoarthritis with degenerative superior  labral tearing.     Lower lumbar spine and left sacroiliac joint degenerative change. 03/20/21- Xray L knee IMPRESSION:  1. No acute osseous abnormality, left knee.  2. Moderate tricompartmental osteoarthritis.    Patient Stated Goals  To be able to walk better    Currently in Pain? No/denies    Pain Score 0-No pain    Pain Location Knee    Pain Orientation Right                               [facility] Adult PT Treatment/Exercise - 07/26/21 0001       Knee/Hip Exercises: Seated   Long Arc Quad Right;Left;15 reps    Long Arc Quad Weight 3 lbs.    Ball Squeeze 15x    Clamshell with TheraBand Red   15x   Marching Right;Left;15 reps    Marching Limitations 3 lbs    Sit to [employer] 10 reps;with UE support                     PT Education - 07/26/21 2219     Education Details Reviewed final witten HEP.    Person(s) Educated Patient    Methods Explanation    Comprehension Verbalized understanding;Returned demonstration              PT Short Term Goals - 07/11/21 1400       PT SHORT TERM GOAL #1   Title Pt/CG will be Ind in an initial HEP with assist from daughter    Status Achieved  Target Date 07/11/21               PT Long Term Goals - 07/26/21 2220       PT LONG TERM GOAL #1   Title Pt will complete mat table t/f chair transfer with RW Indly for safe mobility at home. 07/26/21: Pt is Ind c chair to/from mat table transfer c RW.    Status Achieved    Target Date 07/26/21      PT LONG TERM GOAL #2   Title Pt will be able to walk Indly c RW 61f for mobility within home. 07/26/21: Pt walked 285 ft c RW c a RW and appropriate balance.    Status Achieved    Target Date 07/26/21      PT LONG TERM GOAL #3   Title Pt will be able to amb 1530fc RW and SBA for community mobility.  07/26/21: Pt walked 285 ft c RW c a RW and appropriate balance.    Status New    Target Date 07/26/21      PT LONG TERM GOAL #4   Title Patient will improve TUG to </= 40 seconds to demonstrate reduced fall risk and improved balance.07/20/21:36.3 c RW    Baseline 56.8 sce c RW    Status Achieved    Target Date 07/20/21      PT LONG TERM GOAL #5   Title Patient will improve 5xSTS to </= 25  seconds to demonstrate reduced fall risk and improved balance. 07/20/21: 18.8 sec    Baseline 37.4 sec    Status Achieved    Target Date 07/20/21      PT LONG TERM GOAL #6   Title Pt/CG will be Ind in a final HEP to maintain achieved LOF    Status Achieved    Target Date 07/26/21                   Plan - 07/26/21 1144     Clinical Impression Statement PT was completed today for LE strengthening and for walking tolerance. Pt demonstrated a walking tolerance appropriate for community ambulation. Over the course of PT, pt has met all goals for strength and functional tasks. Pt is ind c a HEP which she reports completing every other day.  Pt is DC from PT services with pt in agreement.    Personal Factors and Comorbidities Past/Current Experience;Time since onset of injury/illness/exacerbation;Comorbidity 1;Age;Fitness    Comorbidities R CVA 2021    Examination-Activity Limitations Transfers;Locomotion Level;Bathing;Bed Mobility;Reach Overhead;Stairs;Stand    Examination-Participation Restrictions Other    Stability/Clinical Decision Making Evolving/Moderate complexity    Clinical Decision Making Moderate    Rehab Potential Fair    PT Frequency 2x / week    PT Duration 6 weeks    PT Treatment/Interventions ADLs/Self Care Home Management;Neuromuscular re-education;Balance training;Therapeutic exercise;Therapeutic activities;Functional mobility training;Stair training;Gait training;Patient/family education;Passive range of motion;Taping    PT Home Exercise Plan Access Code: [email or web address]    Consulted and Agree with Plan of Care Patient;Family member/caregiver    Family Member Consulted CG-Pam             Patient will benefit from skilled therapeutic intervention in order to improve the following deficits and impairments:  Abnormal gait, Decreased coordination, Decreased range of motion, Difficulty walking, Impaired UE functional use, Decreased activity tolerance, Pain, Decreased  strength, Postural dysfunction  Visit Diagnosis: Muscle weakness (generalized)  Unsteadiness on feet  Other abnormalities of gait and mobility  Difficulty in walking, not  elsewhere classified  Left foot drop  Other lack of coordination  Hemiplegia and hemiparesis following cerebral infarction affecting left non-dominant side (HCC)  Strain of muscle, fascia and tendon of left hip, initial encounter     Problem List Patient Active Problem List   Diagnosis Date Noted   Hip pain 03/21/2021   History of CVA (cerebrovascular accident) 03/21/2021   Spasticity 11/30/2020   Wheelchair dependence 10/07/2020   Left knee pain 07/26/2020   Cerebrovascular accident (CVA) of right basal ganglia ([place]) 07/26/2020   Left hemiparesis ([place]) 07/25/2020   Hypokalemia 07/20/2020   Right basal ganglia embolic stroke ([place]) 01/74/9449   HTN (hypertension) 07/20/2020   Hypothyroidism 07/20/2020    PHYSICAL THERAPY DISCHARGE SUMMARY  Visits from Start of Care: 9  Current functional level related to goals / functional outcomes: See above   Remaining deficits: See above   Education / Equipment: HEP  Patient agrees to discharge. Patient goals were met. Patient is being discharged due to meeting the stated rehab goals. Gar Ponto MS, PT 07/26/21 10:43 PM   [facility] [address] [place], [place], [address] Phone: [phone]   Fax:  [phone]  Name: Nicole [name] MRN: [id number] Date of Birth: 1929-11-11

## 2021-08-02 ENCOUNTER — Other Ambulatory Visit: Payer: Self-pay | Admitting: Internal Medicine

## 2021-08-02 DIAGNOSIS — Z1231 Encounter for screening mammogram for malignant neoplasm of breast: Secondary | ICD-10-CM

## 2021-08-24 DIAGNOSIS — Z23 Encounter for immunization: Secondary | ICD-10-CM | POA: Diagnosis not present

## 2021-09-15 ENCOUNTER — Ambulatory Visit: Payer: Medicare Other

## 2021-09-16 ENCOUNTER — Other Ambulatory Visit: Payer: Self-pay

## 2021-09-16 ENCOUNTER — Ambulatory Visit
Admission: RE | Admit: 2021-09-16 | Discharge: 2021-09-16 | Disposition: A | Discharge status: Home / Self Care | Payer: Medicare Other | Source: Ambulatory Visit | Attending: Internal Medicine | Admitting: Internal Medicine

## 2021-09-16 DIAGNOSIS — Z1231 Encounter for screening mammogram for malignant neoplasm of breast: Secondary | ICD-10-CM

## 2021-09-16 IMAGING — MG MM DIGITAL SCREENING BILAT W/ TOMO AND CAD
8 series · 9 of 24 positions shown · non-contrast
Comparison: Previous exam(s).

CLINICAL DATA: Screening.

EXAM:
DIGITAL SCREENING BILATERAL MAMMOGRAM WITH TOMOSYNTHESIS AND CAD
TECHNIQUE: Bilateral screening digital craniocaudal and mediolateral oblique
mammograms were obtained. Bilateral screening digital breast
tomosynthesis was performed. The images were evaluated with
computer-aided detection.

[L CC synth-2D]
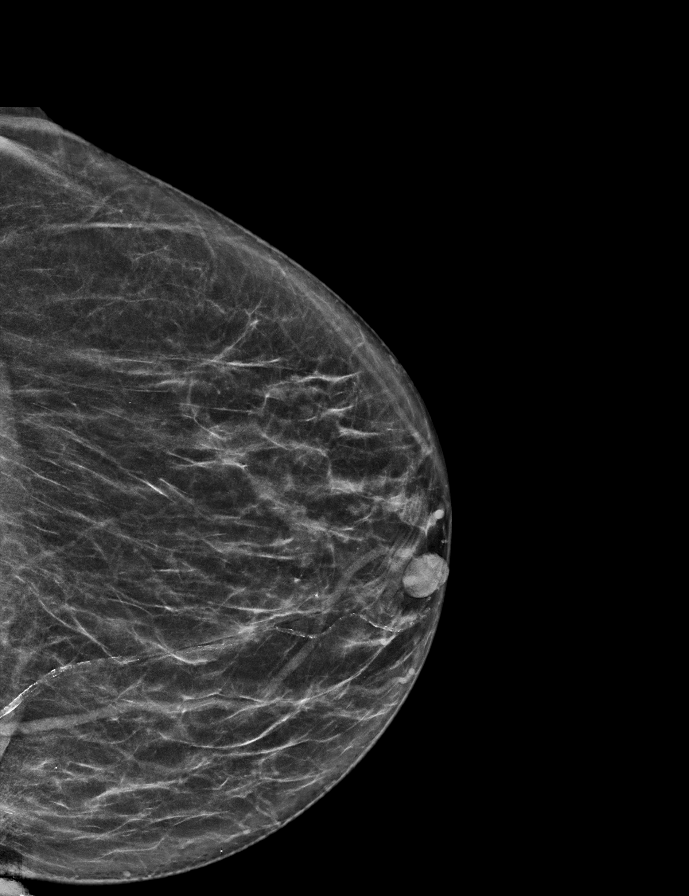

[R CC synth-2D]
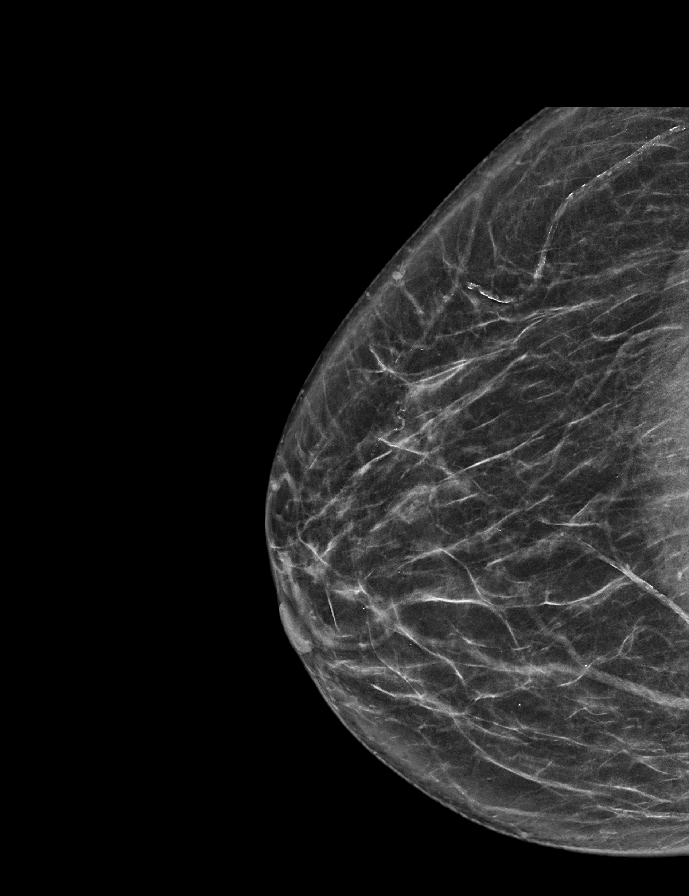

[R MLO synth-2D]
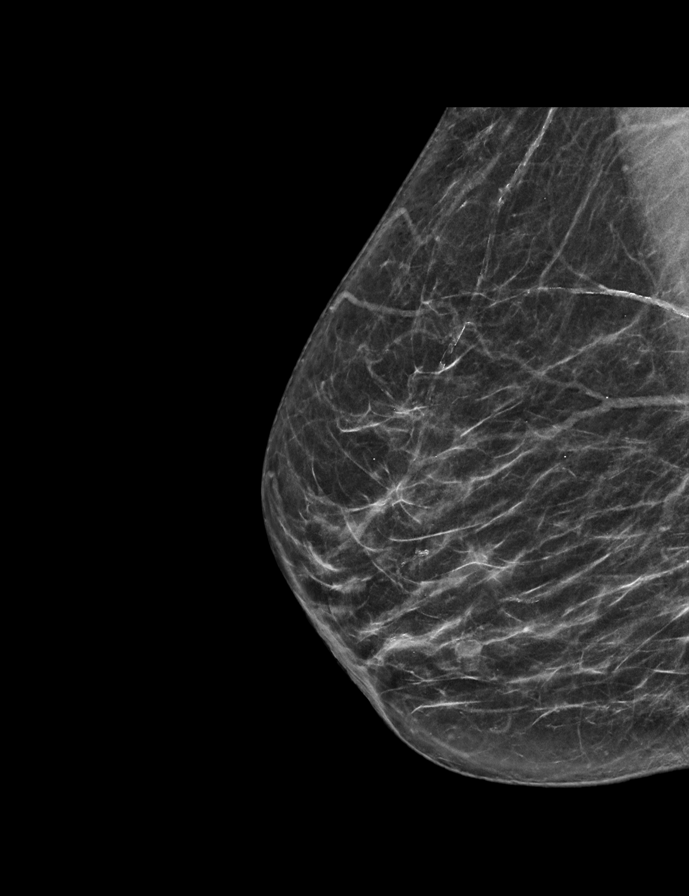

[L MLO synth-2D]
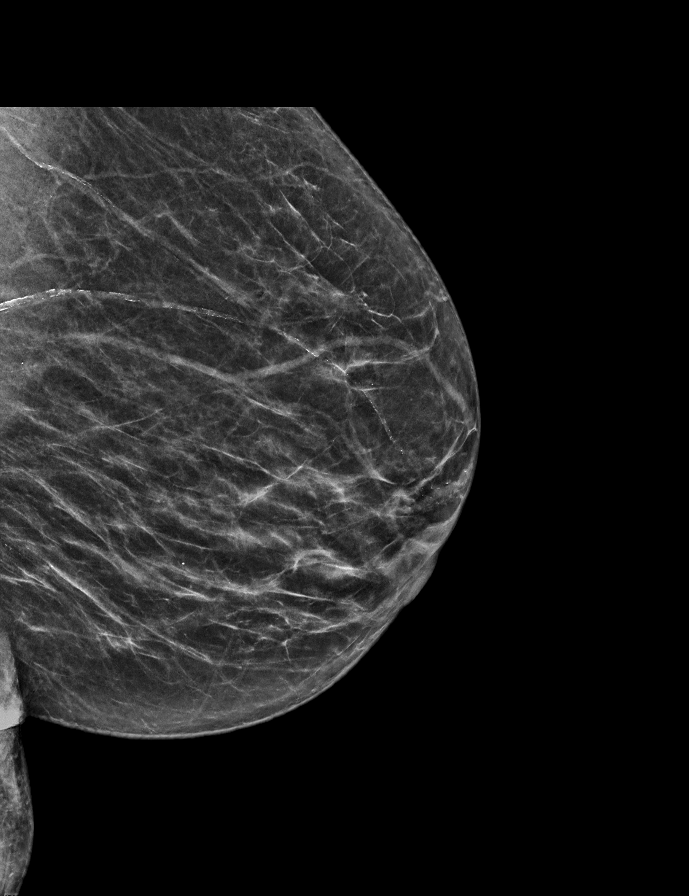

[R CC tomo · 2 of 59 frames shown]
[frame 20/59]
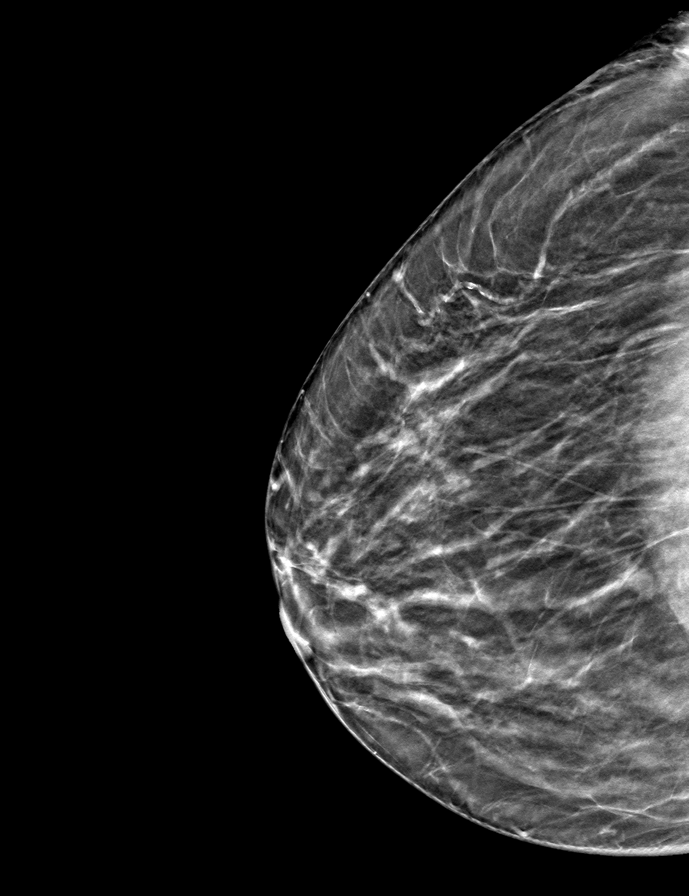
[frame 30/59]
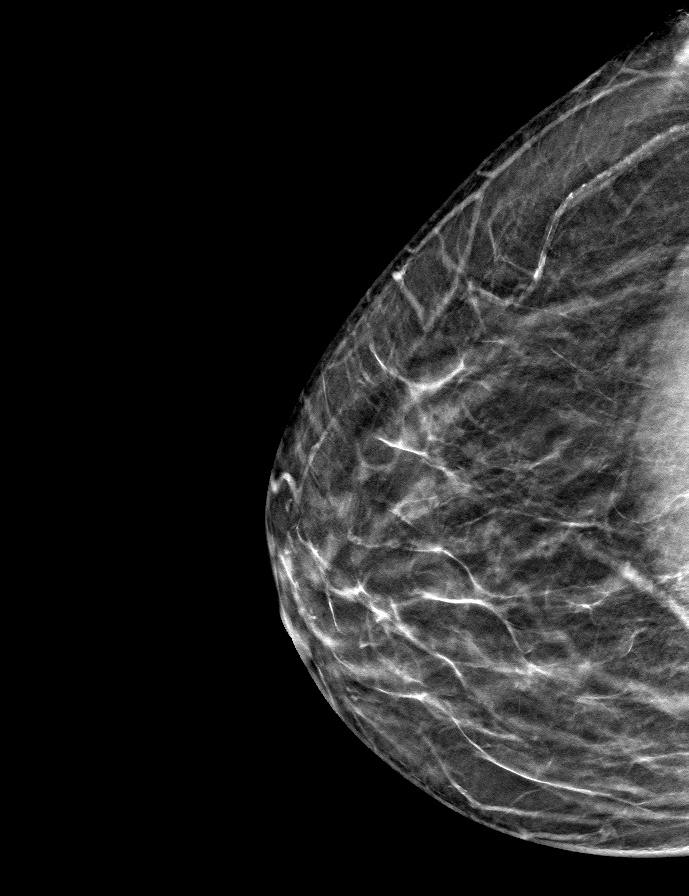

[L CC tomo · tomo slice 29/58.0]
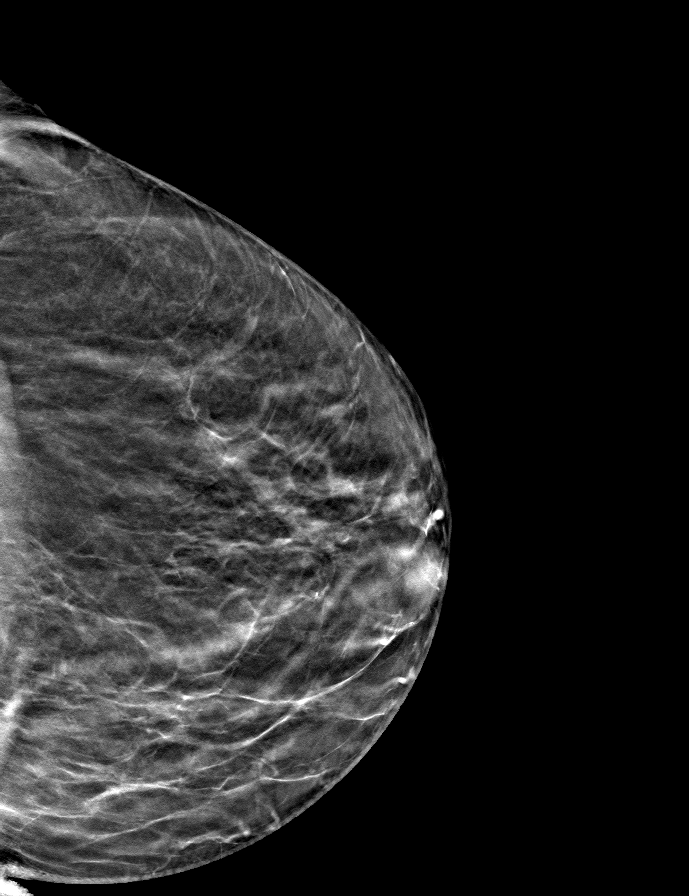

[L MLO tomo · tomo slice 30/59.0]
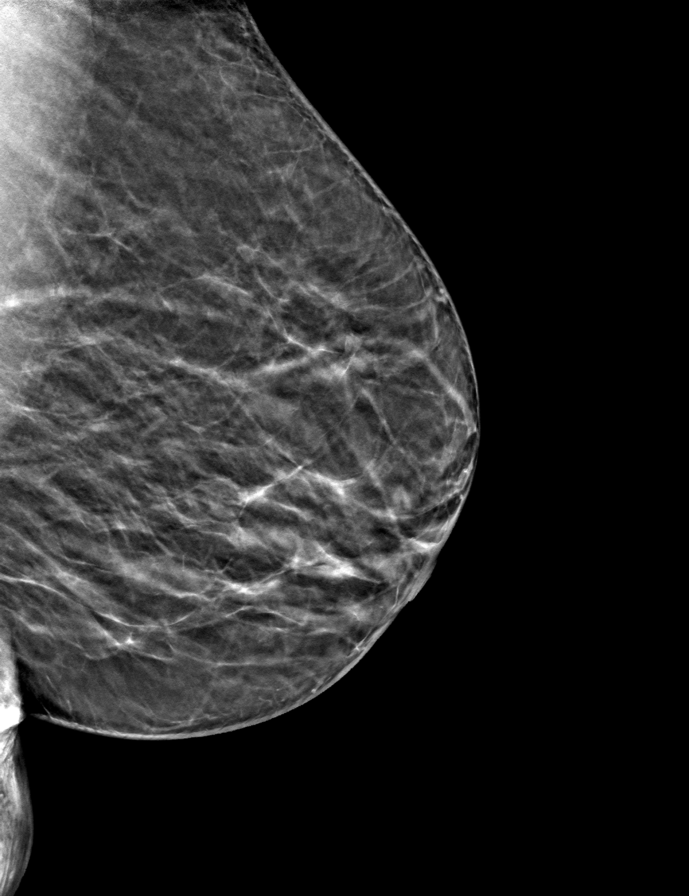

[R MLO tomo · tomo slice 30/59.0]
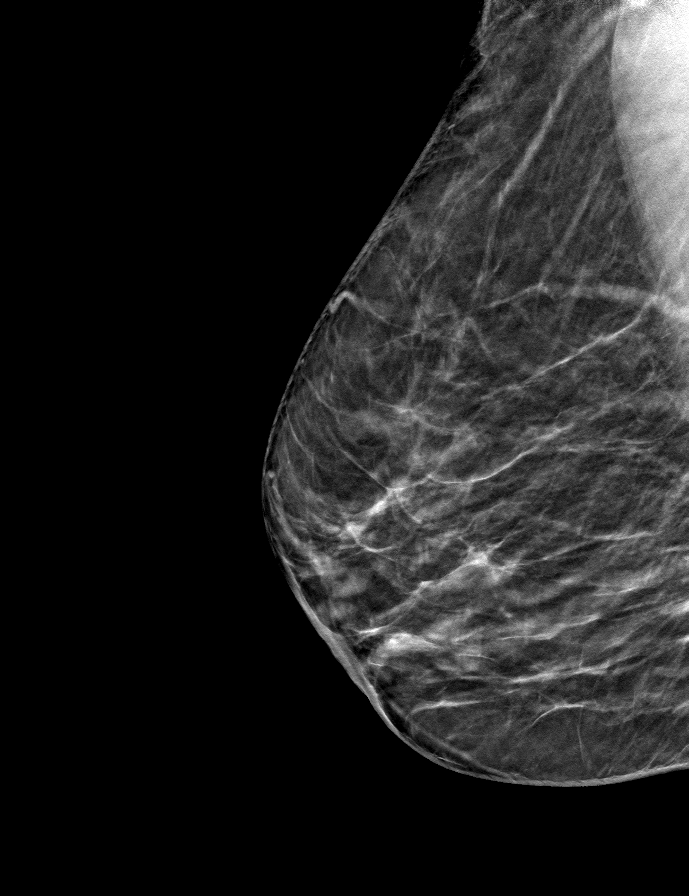

[9 of 24 positions shown; findings below may reference images not displayed]

ACR Breast Density Category b: There are scattered areas of
fibroglandular density.
FINDINGS: There are no findings suspicious for malignancy.
IMPRESSION: No mammographic evidence of malignancy. A result letter of this
screening mammogram will be mailed directly to the patient.

RECOMMENDATION:
Screening mammogram in one year. (Code:[BY])

BI-RADS CATEGORY  1: Negative.

## 2021-09-27 DIAGNOSIS — M170 Bilateral primary osteoarthritis of knee: Secondary | ICD-10-CM | POA: Diagnosis not present

## 2021-10-03 ENCOUNTER — Ambulatory Visit: Payer: Medicare Other

## 2021-10-11 DIAGNOSIS — M170 Bilateral primary osteoarthritis of knee: Secondary | ICD-10-CM | POA: Diagnosis not present

## 2021-10-16 ENCOUNTER — Other Ambulatory Visit: Payer: Self-pay

## 2021-10-16 MED ORDER — CLOPIDOGREL BISULFATE 75 MG PO TABS: 75.0000 mg | ORAL_TABLET | Freq: Every day | ORAL | 3 refills | Status: AC

## 2021-10-18 DIAGNOSIS — M170 Bilateral primary osteoarthritis of knee: Secondary | ICD-10-CM | POA: Diagnosis not present

## 2021-10-25 DIAGNOSIS — M170 Bilateral primary osteoarthritis of knee: Secondary | ICD-10-CM | POA: Diagnosis not present

## 2021-11-16 ENCOUNTER — Ambulatory Visit: Payer: Medicare Other | Admitting: Adult Health

## 2021-11-16 NOTE — Progress Notes (Unsigned)
[facility] [address] [place]. [place] [address] [phone]       OFFICE FOLLOW-UP NOTE  Nicole. Nicole [name] Date of Birth:  01/28/1930 Medical Record Number:  [id number]    Primary neurologist: Dr. Leonie Man Primary care provider: Burnard Bunting, MD    Reason for visit: Stroke follow-up   No chief complaint on file.    HPI:   Update, 11/15/2021, JM: Nicole [name] is here today for a stroke follow-up accompanied by ***. Reports [stable/improving] ***, denies new stroke/TIA symptoms. HH PT/OT ***. Remains on Plavix and Atorvastatin without side effects. BP today ***. Labs A1c 5.4, LDL 128 (07/21/2020).   Update, 05/03/2021, Nicole. Esselman returns for 86-month stroke follow-up accompanied by her personal CNA Pam.  Stable from stroke standpoint without new stroke/TIA symptoms.  She reports residual left-sided weakness with ongoing improvement since prior visit.  She has been working with [facility] PT/OT.  Currently ambulating short distance with rolling walker and denies any recent falls.  Reports compliance on Plavix and atorvastatin without associated side effects.  Blood pressure today 149/76 -does not routinely monitor at home.  No concerns at this time.   History provided for reference purposes only Initial visit 11/01/2020 JM: Nicole. [name] is a pleasant 88 year old African-American lady seen today for initial office follow-up visit following hospital consultation for stroke in September 2021.  History is obtained from the patient and daughter as well as review of electronic medical records and I personally reviewed pertinent imaging films in PACS.  She has past medical history of hyperlipidemia, hypertension, hypothyroidism who presented on 07/20/2020 to [facility] with sudden onset of left-sided weakness but presented outside the window for TPA.  NIH stroke scale was 8 on admission.  Stat CT scan of the head revealed no acute changes and CT angiogram of the head and neck  showed only minor atherosclerotic changes but no large vessel stenosis or occlusion.  MRI scan of the brain shows a large 2 cm diffusion positive lesion in the right posterior corona radiata and right lentiform nucleus.  LDL cholesterol was 125 mg percent hemoglobin A1c was 5.4.  2D echo showed normal ejection fraction without cardiac source of embolism.  Cardiac telemetry monitoring did not show any atrial arrhythmias.  Qualified for an participate in the BMS stroke prevention trial and completed trial participation on 10/18/2020.  She was placed on Plavix alone as she had been on aspirin prior to admission.  Patient is currently living at home with her daughter.  She is getting outpatient physical occupational therapy.  She states her left upper extremity strength has improved but she is still has lack of confidence in significant leg weakness and is not able to walk even with the help of the therapist.  She is however making slow progress.  Her speech has improved.  She is tolerating Plavix well with only minor bruising.  Blood pressures well controlled and today it is 113/62.  She is tolerating Lipitor well without muscle aches and pains.  She has no new complaints.  She had follow-up MRI scan of the brain done as per [facility] stroke study protocol and 10/19/2020 which showed expected evolutionary changes in the recent large right corona radiata infarct without any unexpected or new findings.  ROS:   14 system review of systems is positive for those listed in HPI all other systems negative  PMH:  Past Medical History:  Diagnosis Date   Hypertension    Stroke [facility])    September  2021   Thyroid disease     Social History:  Social History   Socioeconomic History   Marital status: Single    Spouse name: Not on file   Number of children: Not on file   Years of education: Not on file   Highest education level: Not on file  Occupational History   Not on file  Tobacco Use   Smoking status: Never    Smokeless tobacco: Never  Vaping Use   Vaping Use: Never used  Substance and Sexual Activity   Alcohol use: No    Alcohol/week: 0.0 standard drinks   Drug use: No   Sexual activity: Not on file  Other Topics Concern   Not on file  Social History Narrative   Lives with daughter   Right handed   Drinks 1-2 cups caffeine daily   Social Determinants of Health   Financial Resource Strain: Not on file  Food Insecurity: Not on file  Transportation Needs: Not on file  Physical Activity: Not on file  Stress: Not on file  Social Connections: Not on file  Intimate Partner Violence: Not on file    Medications:   Current Outpatient Medications on File Prior to Visit  Medication Sig Dispense Refill   acetaminophen (TYLENOL) 325 MG tablet Take 2 tablets (650 mg total) by mouth every 8 (eight) hours. (Patient taking differently: Take 650 mg by mouth every 6 (six) hours as needed for mild pain or headache.)     albuterol (VENTOLIN HFA) 108 (90 Base) MCG/ACT inhaler INHALE 1 PUFF INTO THE LUNGS EVERY FOUR HOURS AS NEEDED FOR WHEEZING OR SHORTNESS OF BREATH. (Patient taking differently: Inhale 1 puff into the lungs every 4 (four) hours as needed for wheezing or shortness of breath.) 8.5 g 0   amLODipine (NORVASC) 10 MG tablet Take 10 mg by mouth daily.     atorvastatin (LIPITOR) 80 MG tablet TAKE 1 TABLET (80 MG TOTAL) BY MOUTH DAILY. (Patient taking differently: Take 80 mg by mouth daily.) 45 tablet 1   baclofen (LIORESAL) 10 MG tablet Take 1 tablet (10 mg total) by mouth 4 (four) times daily. Take 1 pill in AM and 1 in afternoon and 1.5 to 2 pills at night- for spasticity (Patient taking differently: Take 10-20 mg by mouth See admin instructions. 10 mg in the morning and afternoon. 15-20 mg at bedtime.) 120 each 5   Calcium Carb-Cholecalciferol (CALCIUM + D3) 600-800 MG-UNIT TABS Take 1 tablet by mouth 2 (two) times daily.     cetirizine (ZYRTEC) 10 MG tablet Take 1 tablet (10 mg total) by mouth  daily as needed for allergies or rhinitis. (Patient taking differently: Take 10 mg by mouth daily.)     clopidogrel (PLAVIX) 75 MG tablet Take 1 tablet (75 mg total) by mouth daily. 90 tablet 3   hydrochlorothiazide (HYDRODIURIL) 25 MG tablet Take 25 mg by mouth daily.     levothyroxine (SYNTHROID) 50 MCG tablet Take 1 tablet (50 mcg total) by mouth daily before breakfast. 30 tablet 0   lisinopril (ZESTRIL) 20 MG tablet TAKE 1 TABLET (20 MG TOTAL) BY MOUTH DAILY. (Patient taking differently: Take 20 mg by mouth daily.) 30 tablet 0   oxybutynin (DITROPAN-XL) 5 MG 24 hr tablet Take 5 mg by mouth daily.     pantoprazole (PROTONIX) 40 MG tablet Take 40 mg by mouth 2 (two) times daily as needed.     polyethylene glycol (MIRALAX / GLYCOLAX) 17 g packet Take 17 g by mouth daily  as needed. (Patient taking differently: Take 17 g by mouth daily as needed for mild constipation.) 14 each 0   vitamin B-12 (CYANOCOBALAMIN) 250 MCG tablet Take 1 tablet (250 mcg total) by mouth daily. 30 tablet 0   No current facility-administered medications on file prior to visit.    Allergies:  No Known Allergies  Physical Exam There were no vitals filed for this visit.  There is no height or weight on file to calculate BMI.   General: well developed, well nourished very pleasant elderly African-American lady, seated, in no evident distress Head: head normocephalic and atraumatic.  Neck: supple with no carotid or supraclavicular bruits Cardiovascular: regular rate and rhythm, no murmurs Musculoskeletal: no deformity Skin:  no rash/petichiae Vascular:  Normal pulses all extremities  Neurologic Exam Mental Status: Awake and fully alert.  Fluent speech.  Oriented to place and time. Recent and remote memory intact. Attention span, concentration and fund of knowledge appropriate. Mood and affect appropriate.  Cranial Nerves:  Pupils equal, briskly reactive to light. Extraocular movements full without nystagmus. Visual  fields full to confrontation. Hearing intact. Facial sensation intact.  Mild left lower facial weakness., tongue, palate moves normally and symmetrically.  Motor: Left hemiparesis with subtle left upper extremity drift with mild weakness of left grip and intrinsic hand muscles and orbits right over left upper extremity.  Left lower extremity strength is 4/5 proximal and 3/5 ADF with mild swelling.  Tone is increased slightly on the left compared to the right.  Right-sided strength is normal Sensory.: intact to pinprick .position and vibratory sensation although slight decrease sensation with light touch on left side Coordination: Rapid alternating movements normal in all extremities except slightly decreased left hand. Finger-to-nose and heel-to-shin performed accurately bilaterally. Gait and Station: Deferred as RW not present Reflexes: 1+ and asymmetric and brisker on the left. Toes downgoing.       ASSESSMENT: 88 year old African-American lady with right brain large subcortical infarct of cryptogenic etiology in September 2021 with vascular risk factors of hypertension hyperlipidemia and age.  She has completed participation in the [facility] stroke prevention trial     PLAN:  1.  Right subcortical stroke, cryptogenic -Residual left spastic hemiparesis with improvement compared to prior visit -encourage continued participation with [facility] PT/OT.  Use of rolling walker at all times unless otherwise instructed -Continue Plavix and atorvastatin 80 mg daily for secondary stroke prevention -Discussed secondary stroke prevention measures and importance of aggressive stroke risk factor management monitored and managed by PCP  2.  HTN -BP goal<130/90. Stable on amlodipine, hydrochlorothiazide and lisinopril per PCP  3.  HLD -LDL goal<70.  On atorvastatin 80 mg daily -reports routine lab work by PCP which has been satisfactory (unable to personally view via epic)   Follow-up in 6 months or call earlier  if needed    CC:  [place] provider: Dr. Lorrine Kin, Delfino Lovett, MD   I spent 26 minutes of face-to-face and non-face-to-face time with patient and CNA.  This included previsit chart review, lab review, study review, electronic health record documentation, and patient and CNA education and discussion regarding prior stroke including secondary stroke prevention measures and aggressive stroke risk factor management, residual deficits and further recovery and answered all other questions to patient and CNA's satisfaction  Frann Rider, AGNP-BC  [facility] [address] [place], [place] [address]  Phone [phone] Fax [phone] Note: This document was prepared with digital dictation and possible smart phrase technology. Any transcriptional errors that result from  this process are unintentional.

## 2021-12-20 ENCOUNTER — Ambulatory Visit: Payer: Medicare Other | Admitting: Adult Health

## 2021-12-20 ENCOUNTER — Telehealth: Payer: Self-pay | Admitting: Adult Health

## 2021-12-20 ENCOUNTER — Encounter: Payer: Self-pay | Admitting: Adult Health

## 2021-12-20 VITALS — BP 148/78 | HR 82 | Ht 64.0 in | Wt 135.0 lb

## 2021-12-20 DIAGNOSIS — G8114 Spastic hemiplegia affecting left nondominant side: Secondary | ICD-10-CM | POA: Diagnosis not present

## 2021-12-20 DIAGNOSIS — J310 Chronic rhinitis: Secondary | ICD-10-CM | POA: Diagnosis not present

## 2021-12-20 DIAGNOSIS — I639 Cerebral infarction, unspecified: Secondary | ICD-10-CM

## 2021-12-20 DIAGNOSIS — M7502 Adhesive capsulitis of left shoulder: Secondary | ICD-10-CM

## 2021-12-20 DIAGNOSIS — R0982 Postnasal drip: Secondary | ICD-10-CM | POA: Diagnosis not present

## 2021-12-20 DIAGNOSIS — E785 Hyperlipidemia, unspecified: Secondary | ICD-10-CM | POA: Diagnosis not present

## 2021-12-20 DIAGNOSIS — H903 Sensorineural hearing loss, bilateral: Secondary | ICD-10-CM | POA: Diagnosis not present

## 2021-12-20 NOTE — Telephone Encounter (Signed)
Referral sent to [facility] [phone]. °

## 2021-12-20 NOTE — Patient Instructions (Addendum)
Continue clopidogrel 75 mg daily  and atorvastatin for secondary stroke prevention  We will check cholesterol levels today and make any adjustments if needed   Referral will be placed to orthopedics for evaluation of shoulder pain likely from your stroke  Continue to follow up with PCP regarding blood pressure and cholesterol management  Maintain strict control of hypertension with blood pressure goal below 130/90 and cholesterol with LDL cholesterol (bad cholesterol) goal below 70 mg/dL.   Signs of a Stroke? Follow the BEFAST method:  Balance Watch for a sudden loss of balance, trouble with coordination or vertigo Eyes Is there a sudden loss of vision in one or both eyes? Or double vision?  Face: Ask the person to smile. Does one side of the face droop or is it numb?  Arms: Ask the person to raise both arms. Does one arm drift downward? Is there weakness or numbness of a leg? Speech: Ask the person to repeat a simple phrase. Does the speech sound slurred/strange? Is the person confused ? Time: If you observe any of these signs, call 911.       Thank you for coming to see [place] at [facility]. I hope we have been able to provide you high quality care today.  You may receive a patient satisfaction survey over the next few weeks. We would appreciate your feedback and comments so that we may continue to improve ourselves and the health of our patients.

## 2021-12-20 NOTE — Progress Notes (Signed)
[facility] [address] [place]. [place] [address] [phone]       OFFICE FOLLOW-UP NOTE  Nicole [name] Date of Birth:  03-19-1930 Medical Record Number:  [id number]    Primary neurologist: Dr. Leonie Man Primary care provider: Burnard Bunting, MD    Reason for visit: Stroke follow-up   Chief Complaint  Patient presents with   Follow-up    RM 3 with daughter Nicole [name]  Pt is well and stable, no new stroke concerns      HPI:   Update 12/20/2021 JM: Returns for stroke follow-up after prior visit 8 months ago.  Accompanied by daughter, Nicole [name].  Overall stable without new stroke/TIA symptoms.  Reports residual left-sided weakness which has been overall stable since prior visit. Does have left shoulder pain with limited ROM since stroke. Is routinely followed by [facility] for knee injections.  Ambulates with rolling walker and wheelchair for short distance.  No recent falls.  Remains on Plavix and Atorvastatin without side effects. BP today initially elevated but on recheck 148/78.  Does not routinely monitor at home.  Denies recent cholesterol levels check.  No further concerns at this time.    History provided for reference purposes only Update 05/03/2021 JM: Nicole [name] returns for 41-month stroke follow-up accompanied by her personal CNA Nicole [name].  Stable from stroke standpoint without new stroke/TIA symptoms.  She reports residual left-sided weakness with ongoing improvement since prior visit.  She has been working with [facility] PT/OT.  Currently ambulating short distance with rolling walker and denies any recent falls.  Reports compliance on Plavix and atorvastatin without associated side effects.  Blood pressure today 149/76 -does not routinely monitor at home.  No concerns at this time.  Initial visit 11/01/2020 Dr. Leonie Man: Nicole [name] is a pleasant 88 year old African-American lady seen today for initial office follow-up visit following hospital consultation  for stroke in September 2021.  History is obtained from the patient and daughter as well as review of electronic medical records and I personally reviewed pertinent imaging films in PACS.  She has past medical history of hyperlipidemia, hypertension, hypothyroidism who presented on 07/20/2020 to [facility] with sudden onset of left-sided weakness but presented outside the window for TPA.  NIH stroke scale was 8 on admission.  Stat CT scan of the head revealed no acute changes and CT angiogram of the head and neck showed only minor atherosclerotic changes but no large vessel stenosis or occlusion.  MRI scan of the brain shows a large 2 cm diffusion positive lesion in the right posterior corona radiata and right lentiform nucleus.  LDL cholesterol was 125 mg percent hemoglobin A1c was 5.4.  2D echo showed normal ejection fraction without cardiac source of embolism.  Cardiac telemetry monitoring did not show any atrial arrhythmias.  Qualified for an participate in the BMS stroke prevention trial and completed trial participation on 10/18/2020.  She was placed on Plavix alone as she had been on aspirin prior to admission.  Patient is currently living at home with her daughter.  She is getting outpatient physical occupational therapy.  She states her left upper extremity strength has improved but she is still has lack of confidence in significant leg weakness and is not able to walk even with the help of the therapist.  She is however making slow progress.  Her speech has improved.  She is tolerating Plavix well with only minor bruising.  Blood pressures well controlled and today it is 113/62.  She  is tolerating Lipitor well without muscle aches and pains.  She has no new complaints.  She had follow-up MRI scan of the brain done as per [facility] stroke study protocol and 10/19/2020 which showed expected evolutionary changes in the recent large right corona radiata infarct without any unexpected or new  findings.    ROS:   14 system review of systems is positive for those listed in HPI all other systems negative  PMH:  Past Medical History:  Diagnosis Date   Hypertension    Stroke [facility])    September 2021   Thyroid disease     Social History:  Social History   Socioeconomic History   Marital status: Single    Spouse name: Not on file   Number of children: Not on file   Years of education: Not on file   Highest education level: Not on file  Occupational History   Not on file  Tobacco Use   Smoking status: Never   Smokeless tobacco: Never  Vaping Use   Vaping Use: Never used  Substance and Sexual Activity   Alcohol use: No    Alcohol/week: 0.0 standard drinks   Drug use: No   Sexual activity: Not on file  Other Topics Concern   Not on file  Social History Narrative   Lives with daughter   Right handed   Drinks 1-2 cups caffeine daily   Social Determinants of Health   Financial Resource Strain: Not on file  Food Insecurity: Not on file  Transportation Needs: Not on file  Physical Activity: Not on file  Stress: Not on file  Social Connections: Not on file  Intimate Partner Violence: Not on file    Medications:   Current Outpatient Medications on File Prior to Visit  Medication Sig Dispense Refill   acetaminophen (TYLENOL) 325 MG tablet Take 2 tablets (650 mg total) by mouth every 8 (eight) hours. (Patient taking differently: Take 650 mg by mouth every 6 (six) hours as needed for mild pain or headache.)     albuterol (VENTOLIN HFA) 108 (90 Base) MCG/ACT inhaler INHALE 1 PUFF INTO THE LUNGS EVERY FOUR HOURS AS NEEDED FOR WHEEZING OR SHORTNESS OF BREATH. (Patient taking differently: Inhale 1 puff into the lungs every 4 (four) hours as needed for wheezing or shortness of breath.) 8.5 g 0   amLODipine (NORVASC) 10 MG tablet Take 10 mg by mouth daily.     atorvastatin (LIPITOR) 80 MG tablet TAKE 1 TABLET (80 MG TOTAL) BY MOUTH DAILY. (Patient taking differently:  Take 80 mg by mouth daily.) 45 tablet 1   baclofen (LIORESAL) 10 MG tablet Take 1 tablet (10 mg total) by mouth 4 (four) times daily. Take 1 pill in AM and 1 in afternoon and 1.5 to 2 pills at night- for spasticity (Patient taking differently: Take 10-20 mg by mouth See admin instructions. 10 mg in the morning and afternoon. 15-20 mg at bedtime.) 120 each 5   Calcium Carb-Cholecalciferol (CALCIUM + D3) 600-800 MG-UNIT TABS Take 1 tablet by mouth 2 (two) times daily.     cetirizine (ZYRTEC) 10 MG tablet Take 1 tablet (10 mg total) by mouth daily as needed for allergies or rhinitis. (Patient taking differently: Take 10 mg by mouth daily.)     clopidogrel (PLAVIX) 75 MG tablet Take 1 tablet (75 mg total) by mouth daily. 90 tablet 3   hydrochlorothiazide (HYDRODIURIL) 25 MG tablet Take 25 mg by mouth daily.     levothyroxine (SYNTHROID) 50 MCG tablet  Take 1 tablet (50 mcg total) by mouth daily before breakfast. 30 tablet 0   lisinopril (ZESTRIL) 20 MG tablet TAKE 1 TABLET (20 MG TOTAL) BY MOUTH DAILY. (Patient taking differently: Take 20 mg by mouth daily.) 30 tablet 0   oxybutynin (DITROPAN-XL) 5 MG 24 hr tablet Take 5 mg by mouth daily.     pantoprazole (PROTONIX) 40 MG tablet Take 40 mg by mouth 2 (two) times daily as needed.     polyethylene glycol (MIRALAX / GLYCOLAX) 17 g packet Take 17 g by mouth daily as needed. (Patient taking differently: Take 17 g by mouth daily as needed for mild constipation.) 14 each 0   vitamin B-12 (CYANOCOBALAMIN) 250 MCG tablet Take 1 tablet (250 mcg total) by mouth daily. 30 tablet 0   No current facility-administered medications on file prior to visit.    Allergies:  No Known Allergies  Physical Exam Today's Vitals   12/20/21 1301  BP: (!) 148/78  Pulse: 82  Weight: 135 lb (61.2 kg)  Height: 5\' 4"  (1.626 m)    Body mass index is 23.17 kg/m.   General: well developed, well nourished very pleasant elderly African-American lady, seated, in no evident  distress Head: head normocephalic and atraumatic.  Neck: supple with no carotid or supraclavicular bruits Cardiovascular: regular rate and rhythm, no murmurs Musculoskeletal: no deformity Skin:  no rash/petichiae Vascular:  Normal pulses all extremities  Neurologic Exam Mental Status: Awake and fully alert.  Fluent speech.  Oriented to place and time. Recent and remote memory intact. Attention span, concentration and fund of knowledge appropriate. Mood and affect appropriate.  Cranial Nerves:  Pupils equal, briskly reactive to light. Extraocular movements full without nystagmus. Visual fields full to confrontation. Hearing intact. Facial sensation intact.  Mild left lower facial weakness., tongue, palate moves normally and symmetrically.  Motor: Left hemiparesis with subtle left upper extremity drift with mild weakness of left grip and intrinsic hand muscles and limited left shoulder ROM and orbits right over left upper extremity and LLE 4+/5.  Tone is increased slightly on the left compared to the right.  Right-sided strength is normal Sensory.: intact to pinprick .position and vibratory sensation although slight decrease sensation with light touch on left side Coordination: Rapid alternating movements normal in all extremities except slightly decreased left hand. Finger-to-nose performed accurately RUE and heel-to-shin performed accurately bilaterally. Gait and Station: Deferred as RW not present Reflexes: 1+ and asymmetric and brisker on the left. Toes downgoing.       ASSESSMENT: 88 year old African-American lady with right brain large subcortical infarct of cryptogenic etiology in September 2021 with vascular risk factors of hypertension hyperlipidemia and age.  She has completed participation in the BMS stroke prevention trial     PLAN:  1.  Right subcortical stroke, cryptogenic -Residual left spastic hemiparesis stable. 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  Follow-up on an  as-needed basis   CC:  Burnard Bunting, MD   I spent 26 minutes of face-to-face and non-face-to-face time with patient and daughter.  This included previsit chart review, lab review, study review, order entry, electronic health record documentation, and patient and daughter education and discussion regarding prior stroke with residual deficits, secondary stroke prevention measures and aggressive stroke risk factor management,and answered all other questions to patient and  daughters satisfaction  Frann Rider, AGNP-BC  [facility] [address] [place], [place] [address]  Phone [phone] Fax [phone] Note: This document was prepared with digital dictation and possible smart phrase technology. Any transcriptional errors that result from this process are unintentional.

## 2021-12-21 ENCOUNTER — Telehealth: Payer: Self-pay

## 2021-12-21 ENCOUNTER — Ambulatory Visit: Payer: Medicare Other | Admitting: Adult Health

## 2021-12-21 DIAGNOSIS — E785 Hyperlipidemia, unspecified: Secondary | ICD-10-CM

## 2021-12-21 LAB — LIPID PANEL
Chol/HDL Ratio: 3.5 ratio (ref 0.0–4.4)
Cholesterol, Total: 247 mg/dL — ABNORMAL HIGH (ref 100–199)
HDL: 70 mg/dL (ref >39)
LDL Chol Calc (NIH): 152 mg/dL — ABNORMAL HIGH (ref 0–99)
Triglycerides: 141 mg/dL (ref 0–149)
VLDL Cholesterol Cal: 25 mg/dL (ref 5–40)

## 2021-12-21 NOTE — Addendum Note (Signed)
Addended by: Ihor Austin L on: 12/21/2021 09:27 AM   Modules accepted: Orders

## 2021-12-21 NOTE — Telephone Encounter (Signed)
-----   Message from Frann Rider, NP sent at 12/21/2021  9:14 AM EST ----- Please advise patient and daughter that recent lab work showed elevated cholesterol levels - this could have been due to nonfasting lab work (which was advised during visit yesterday). It will be recommend to have this repeated prior to making any adjustments. This can either be done in office while fasting next week or at PCP office. If she would like to have done here, please let me know and I will place order. Thank you.

## 2021-12-21 NOTE — Telephone Encounter (Signed)
Contacted pt, spoke to daughte rper DPR. Informed her that recent lab work showed elevated cholesterol levels - this could have been due to nonfasting lab work (which was advised during visit yesterday). It will be recommend to have this repeated prior to making any adjustments. This can either be done in office while fasting next week or at PCP office. Daughter requested it to be done here, she is off Monday and will bring pt between 8-5.

## 2021-12-25 ENCOUNTER — Other Ambulatory Visit (INDEPENDENT_AMBULATORY_CARE_PROVIDER_SITE_OTHER): Payer: Self-pay

## 2021-12-25 DIAGNOSIS — E785 Hyperlipidemia, unspecified: Secondary | ICD-10-CM | POA: Diagnosis not present

## 2021-12-25 DIAGNOSIS — M25512 Pain in left shoulder: Secondary | ICD-10-CM | POA: Diagnosis not present

## 2021-12-25 DIAGNOSIS — Z0289 Encounter for other administrative examinations: Secondary | ICD-10-CM

## 2021-12-26 ENCOUNTER — Other Ambulatory Visit: Payer: Self-pay | Admitting: Adult Health

## 2021-12-26 LAB — LIPID PANEL
Chol/HDL Ratio: 3.7 ratio (ref 0.0–4.4)
Cholesterol, Total: 254 mg/dL — ABNORMAL HIGH (ref 100–199)
HDL: 68 mg/dL (ref >39)
LDL Chol Calc (NIH): 170 mg/dL — ABNORMAL HIGH (ref 0–99)
Triglycerides: 95 mg/dL (ref 0–149)
VLDL Cholesterol Cal: 16 mg/dL (ref 5–40)

## 2021-12-26 MED ORDER — ROSUVASTATIN CALCIUM 40 MG PO TABS: 40.0000 mg | ORAL_TABLET | Freq: Every day | ORAL | 5 refills | Status: DC

## 2021-12-26 NOTE — Telephone Encounter (Signed)
Contacted daughter per DPR, Lvm rq call back

## 2021-12-26 NOTE — Telephone Encounter (Signed)
[name] called back and we discussed results of Lipid Panel. She verbalized understanding and agreement to med change. Will f/u with PCP in 3-4 months on recheck of labs.

## 2022-01-03 ENCOUNTER — Ambulatory Visit: Payer: Medicare Other | Admitting: Adult Health

## 2022-01-31 ENCOUNTER — Ambulatory Visit: Payer: Medicare Other | Admitting: Adult Health

## 2022-01-31 ENCOUNTER — Telehealth: Payer: Self-pay | Admitting: Adult Health

## 2022-01-31 DIAGNOSIS — I10 Essential (primary) hypertension: Secondary | ICD-10-CM | POA: Diagnosis not present

## 2022-01-31 DIAGNOSIS — I639 Cerebral infarction, unspecified: Secondary | ICD-10-CM | POA: Diagnosis not present

## 2022-01-31 DIAGNOSIS — I69354 Hemiplegia and hemiparesis following cerebral infarction affecting left non-dominant side: Secondary | ICD-10-CM | POA: Diagnosis not present

## 2022-01-31 DIAGNOSIS — M1711 Unilateral primary osteoarthritis, right knee: Secondary | ICD-10-CM | POA: Diagnosis not present

## 2022-01-31 NOTE — Telephone Encounter (Signed)
[name] was called, she will either take pt to ER or PCP, she is aware the appointment would be cancelled for this afternoon. ?

## 2022-01-31 NOTE — Telephone Encounter (Signed)
If this is an acute issue, should be going to ER for emergent evaluation. If slowly progressive, should be seen by PCP first to rule out other causes or possibly orthopedic issue contributing to symptoms. ?

## 2022-01-31 NOTE — Telephone Encounter (Signed)
[name] has called to report that it has become more difficult for pt to transfer from wheelchair to couch on her own due to leg weakness.  [name] accepted the open slot with Shanda Bumps, NP this afternoon with a check in of 12:45 this is FYI ?

## 2022-02-04 DIAGNOSIS — M1711 Unilateral primary osteoarthritis, right knee: Secondary | ICD-10-CM | POA: Diagnosis not present

## 2022-02-04 DIAGNOSIS — Z7901 Long term (current) use of anticoagulants: Secondary | ICD-10-CM | POA: Diagnosis not present

## 2022-02-04 DIAGNOSIS — I639 Cerebral infarction, unspecified: Secondary | ICD-10-CM | POA: Diagnosis not present

## 2022-02-04 DIAGNOSIS — Z9181 History of falling: Secondary | ICD-10-CM | POA: Diagnosis not present

## 2022-02-04 DIAGNOSIS — E785 Hyperlipidemia, unspecified: Secondary | ICD-10-CM | POA: Diagnosis not present

## 2022-02-04 DIAGNOSIS — I69354 Hemiplegia and hemiparesis following cerebral infarction affecting left non-dominant side: Secondary | ICD-10-CM | POA: Diagnosis not present

## 2022-02-04 DIAGNOSIS — I10 Essential (primary) hypertension: Secondary | ICD-10-CM | POA: Diagnosis not present

## 2022-02-04 DIAGNOSIS — E039 Hypothyroidism, unspecified: Secondary | ICD-10-CM | POA: Diagnosis not present

## 2022-03-06 DIAGNOSIS — I69354 Hemiplegia and hemiparesis following cerebral infarction affecting left non-dominant side: Secondary | ICD-10-CM | POA: Diagnosis not present

## 2022-03-06 DIAGNOSIS — M1711 Unilateral primary osteoarthritis, right knee: Secondary | ICD-10-CM | POA: Diagnosis not present

## 2022-03-06 DIAGNOSIS — E785 Hyperlipidemia, unspecified: Secondary | ICD-10-CM | POA: Diagnosis not present

## 2022-03-06 DIAGNOSIS — Z9181 History of falling: Secondary | ICD-10-CM | POA: Diagnosis not present

## 2022-03-06 DIAGNOSIS — E039 Hypothyroidism, unspecified: Secondary | ICD-10-CM | POA: Diagnosis not present

## 2022-03-06 DIAGNOSIS — Z7901 Long term (current) use of anticoagulants: Secondary | ICD-10-CM | POA: Diagnosis not present

## 2022-03-06 DIAGNOSIS — I10 Essential (primary) hypertension: Secondary | ICD-10-CM | POA: Diagnosis not present

## 2022-04-23 DIAGNOSIS — M170 Bilateral primary osteoarthritis of knee: Secondary | ICD-10-CM | POA: Diagnosis not present

## 2022-05-03 ENCOUNTER — Other Ambulatory Visit: Payer: Self-pay | Admitting: Physical Medicine and Rehabilitation

## 2022-05-07 DIAGNOSIS — R7301 Impaired fasting glucose: Secondary | ICD-10-CM | POA: Diagnosis not present

## 2022-05-07 DIAGNOSIS — E785 Hyperlipidemia, unspecified: Secondary | ICD-10-CM | POA: Diagnosis not present

## 2022-05-07 DIAGNOSIS — R32 Unspecified urinary incontinence: Secondary | ICD-10-CM | POA: Diagnosis not present

## 2022-05-07 DIAGNOSIS — I10 Essential (primary) hypertension: Secondary | ICD-10-CM | POA: Diagnosis not present

## 2022-05-07 DIAGNOSIS — R82998 Other abnormal findings in urine: Secondary | ICD-10-CM | POA: Diagnosis not present

## 2022-07-16 DIAGNOSIS — M170 Bilateral primary osteoarthritis of knee: Secondary | ICD-10-CM | POA: Diagnosis not present

## 2022-07-23 DIAGNOSIS — M170 Bilateral primary osteoarthritis of knee: Secondary | ICD-10-CM | POA: Diagnosis not present

## 2022-07-30 ENCOUNTER — Other Ambulatory Visit: Payer: Self-pay

## 2022-07-30 DIAGNOSIS — M170 Bilateral primary osteoarthritis of knee: Secondary | ICD-10-CM | POA: Diagnosis not present

## 2022-07-30 MED ORDER — ROSUVASTATIN CALCIUM 40 MG PO TABS: 40.0000 mg | ORAL_TABLET | Freq: Every day | ORAL | 5 refills | Status: DC

## 2022-07-30 NOTE — Progress Notes (Signed)
[facility] faxed over refill request for Rosuvastatin. Rx sent to pharmacy. 07/30/22

## 2022-08-07 ENCOUNTER — Telehealth: Payer: Self-pay | Admitting: Adult Health

## 2022-08-07 NOTE — Telephone Encounter (Signed)
Called daughter and informed her to call PCP who manages her BP medications.. She verbalized understanding, appreciation.

## 2022-08-07 NOTE — Telephone Encounter (Signed)
Pt's is needing her olmesartan (BENICAR) 20 MG tablet filled at the [facility] on [address] and [address]  Daughter states that for some reason it was put to discontinue and that it was incorrect and that the pt is needing it refilled. Please call daughter once it has been called in.

## 2022-09-19 DIAGNOSIS — I700 Atherosclerosis of aorta: Secondary | ICD-10-CM | POA: Diagnosis not present

## 2022-09-19 DIAGNOSIS — R82998 Other abnormal findings in urine: Secondary | ICD-10-CM | POA: Diagnosis not present

## 2022-09-19 DIAGNOSIS — E785 Hyperlipidemia, unspecified: Secondary | ICD-10-CM | POA: Diagnosis not present

## 2022-09-19 DIAGNOSIS — I69354 Hemiplegia and hemiparesis following cerebral infarction affecting left non-dominant side: Secondary | ICD-10-CM | POA: Diagnosis not present

## 2022-09-19 DIAGNOSIS — E039 Hypothyroidism, unspecified: Secondary | ICD-10-CM | POA: Diagnosis not present

## 2022-09-19 DIAGNOSIS — Z0000 Encounter for general adult medical examination without abnormal findings: Secondary | ICD-10-CM | POA: Diagnosis not present

## 2022-09-19 DIAGNOSIS — I10 Essential (primary) hypertension: Secondary | ICD-10-CM | POA: Diagnosis not present

## 2022-09-19 DIAGNOSIS — R5383 Other fatigue: Secondary | ICD-10-CM | POA: Diagnosis not present

## 2023-01-04 DIAGNOSIS — M170 Bilateral primary osteoarthritis of knee: Secondary | ICD-10-CM | POA: Diagnosis not present

## 2023-01-16 DIAGNOSIS — R7301 Impaired fasting glucose: Secondary | ICD-10-CM | POA: Diagnosis not present

## 2023-01-16 DIAGNOSIS — N3944 Nocturnal enuresis: Secondary | ICD-10-CM | POA: Diagnosis not present

## 2023-01-16 DIAGNOSIS — I10 Essential (primary) hypertension: Secondary | ICD-10-CM | POA: Diagnosis not present

## 2023-01-16 DIAGNOSIS — R2681 Unsteadiness on feet: Secondary | ICD-10-CM | POA: Diagnosis not present

## 2023-01-21 ENCOUNTER — Other Ambulatory Visit: Payer: Self-pay

## 2023-01-21 MED ORDER — ROSUVASTATIN CALCIUM 40 MG PO TABS: 40.0000 mg | ORAL_TABLET | Freq: Every day | ORAL | 0 refills | Status: AC

## 2023-01-25 DIAGNOSIS — R2681 Unsteadiness on feet: Secondary | ICD-10-CM | POA: Diagnosis not present

## 2023-01-25 DIAGNOSIS — M1990 Unspecified osteoarthritis, unspecified site: Secondary | ICD-10-CM | POA: Diagnosis not present

## 2023-01-25 DIAGNOSIS — I69354 Hemiplegia and hemiparesis following cerebral infarction affecting left non-dominant side: Secondary | ICD-10-CM | POA: Diagnosis not present

## 2023-01-25 DIAGNOSIS — I639 Cerebral infarction, unspecified: Secondary | ICD-10-CM | POA: Diagnosis not present

## 2023-02-08 DIAGNOSIS — M170 Bilateral primary osteoarthritis of knee: Secondary | ICD-10-CM | POA: Diagnosis not present

## 2023-02-15 ENCOUNTER — Telehealth: Payer: Self-pay | Admitting: Adult Health

## 2023-02-15 NOTE — Telephone Encounter (Signed)
clininical Pharmacist Dannielle Karvonen D @ [facility] is asking for a call to discuss Pt's Anti Platelet therapy

## 2023-02-18 NOTE — Telephone Encounter (Signed)
Called [facility] back. There was no answer. LVM asking for her to call back.   **if she calls back we have not seen the patient since Feb 2023 but as of that last visit she was on clopidogrel 75 mg daily which we had stated to continue. At that visit she was advised to continue following up with PCP. Please ask what question they have if she calls back.

## 2023-04-02 DIAGNOSIS — R296 Repeated falls: Secondary | ICD-10-CM | POA: Diagnosis not present

## 2023-04-02 DIAGNOSIS — M1711 Unilateral primary osteoarthritis, right knee: Secondary | ICD-10-CM | POA: Diagnosis not present

## 2023-05-06 DIAGNOSIS — R32 Unspecified urinary incontinence: Secondary | ICD-10-CM | POA: Diagnosis not present

## 2023-05-06 DIAGNOSIS — I69354 Hemiplegia and hemiparesis following cerebral infarction affecting left non-dominant side: Secondary | ICD-10-CM | POA: Diagnosis not present

## 2023-05-20 DIAGNOSIS — H9201 Otalgia, right ear: Secondary | ICD-10-CM | POA: Diagnosis not present

## 2023-05-20 DIAGNOSIS — H903 Sensorineural hearing loss, bilateral: Secondary | ICD-10-CM | POA: Diagnosis not present

## 2023-05-27 DIAGNOSIS — I700 Atherosclerosis of aorta: Secondary | ICD-10-CM | POA: Diagnosis not present

## 2023-05-31 DIAGNOSIS — M25562 Pain in left knee: Secondary | ICD-10-CM | POA: Diagnosis not present

## 2023-05-31 DIAGNOSIS — M25561 Pain in right knee: Secondary | ICD-10-CM | POA: Diagnosis not present

## 2023-06-03 DIAGNOSIS — M170 Bilateral primary osteoarthritis of knee: Secondary | ICD-10-CM | POA: Diagnosis not present

## 2023-07-05 DIAGNOSIS — M1711 Unilateral primary osteoarthritis, right knee: Secondary | ICD-10-CM | POA: Diagnosis not present

## 2023-07-05 DIAGNOSIS — R296 Repeated falls: Secondary | ICD-10-CM | POA: Diagnosis not present

## 2023-07-05 DIAGNOSIS — I679 Cerebrovascular disease, unspecified: Secondary | ICD-10-CM | POA: Diagnosis not present

## 2023-07-05 DIAGNOSIS — I69359 Hemiplegia and hemiparesis following cerebral infarction affecting unspecified side: Secondary | ICD-10-CM | POA: Diagnosis not present

## 2023-07-11 DIAGNOSIS — H903 Sensorineural hearing loss, bilateral: Secondary | ICD-10-CM | POA: Diagnosis not present

## 2023-07-11 DIAGNOSIS — H9201 Otalgia, right ear: Secondary | ICD-10-CM | POA: Diagnosis not present

## 2023-07-19 DIAGNOSIS — N3281 Overactive bladder: Secondary | ICD-10-CM | POA: Diagnosis not present

## 2023-07-19 DIAGNOSIS — R531 Weakness: Secondary | ICD-10-CM | POA: Diagnosis not present

## 2023-07-19 DIAGNOSIS — R051 Acute cough: Secondary | ICD-10-CM | POA: Diagnosis not present

## 2023-08-05 DIAGNOSIS — R296 Repeated falls: Secondary | ICD-10-CM | POA: Diagnosis not present

## 2023-08-05 DIAGNOSIS — I679 Cerebrovascular disease, unspecified: Secondary | ICD-10-CM | POA: Diagnosis not present

## 2023-08-05 DIAGNOSIS — I69359 Hemiplegia and hemiparesis following cerebral infarction affecting unspecified side: Secondary | ICD-10-CM | POA: Diagnosis not present

## 2023-08-05 DIAGNOSIS — M1711 Unilateral primary osteoarthritis, right knee: Secondary | ICD-10-CM | POA: Diagnosis not present

## 2023-09-04 DIAGNOSIS — I679 Cerebrovascular disease, unspecified: Secondary | ICD-10-CM | POA: Diagnosis not present

## 2023-09-04 DIAGNOSIS — R296 Repeated falls: Secondary | ICD-10-CM | POA: Diagnosis not present

## 2023-09-04 DIAGNOSIS — I69359 Hemiplegia and hemiparesis following cerebral infarction affecting unspecified side: Secondary | ICD-10-CM | POA: Diagnosis not present

## 2023-09-04 DIAGNOSIS — M1711 Unilateral primary osteoarthritis, right knee: Secondary | ICD-10-CM | POA: Diagnosis not present

## 2023-09-16 DIAGNOSIS — M170 Bilateral primary osteoarthritis of knee: Secondary | ICD-10-CM | POA: Diagnosis not present

## 2023-09-25 DIAGNOSIS — Z23 Encounter for immunization: Secondary | ICD-10-CM | POA: Diagnosis not present

## 2023-09-25 DIAGNOSIS — E785 Hyperlipidemia, unspecified: Secondary | ICD-10-CM | POA: Diagnosis not present

## 2023-09-25 DIAGNOSIS — I69354 Hemiplegia and hemiparesis following cerebral infarction affecting left non-dominant side: Secondary | ICD-10-CM | POA: Diagnosis not present

## 2023-09-25 DIAGNOSIS — Z0000 Encounter for general adult medical examination without abnormal findings: Secondary | ICD-10-CM | POA: Diagnosis not present

## 2023-09-25 DIAGNOSIS — Z1331 Encounter for screening for depression: Secondary | ICD-10-CM | POA: Diagnosis not present

## 2023-09-25 DIAGNOSIS — R7301 Impaired fasting glucose: Secondary | ICD-10-CM | POA: Diagnosis not present

## 2023-09-25 DIAGNOSIS — Z1339 Encounter for screening examination for other mental health and behavioral disorders: Secondary | ICD-10-CM | POA: Diagnosis not present

## 2023-09-25 DIAGNOSIS — E039 Hypothyroidism, unspecified: Secondary | ICD-10-CM | POA: Diagnosis not present

## 2023-09-25 DIAGNOSIS — D509 Iron deficiency anemia, unspecified: Secondary | ICD-10-CM | POA: Diagnosis not present

## 2023-09-25 DIAGNOSIS — I10 Essential (primary) hypertension: Secondary | ICD-10-CM | POA: Diagnosis not present

## 2023-10-05 DIAGNOSIS — I69359 Hemiplegia and hemiparesis following cerebral infarction affecting unspecified side: Secondary | ICD-10-CM | POA: Diagnosis not present

## 2023-10-05 DIAGNOSIS — R296 Repeated falls: Secondary | ICD-10-CM | POA: Diagnosis not present

## 2023-10-05 DIAGNOSIS — I679 Cerebrovascular disease, unspecified: Secondary | ICD-10-CM | POA: Diagnosis not present

## 2023-10-05 DIAGNOSIS — M1711 Unilateral primary osteoarthritis, right knee: Secondary | ICD-10-CM | POA: Diagnosis not present

## 2023-11-01 DIAGNOSIS — R058 Other specified cough: Secondary | ICD-10-CM | POA: Diagnosis not present

## 2023-11-01 DIAGNOSIS — J0100 Acute maxillary sinusitis, unspecified: Secondary | ICD-10-CM | POA: Diagnosis not present

## 2023-11-04 DIAGNOSIS — M1711 Unilateral primary osteoarthritis, right knee: Secondary | ICD-10-CM | POA: Diagnosis not present

## 2023-11-04 DIAGNOSIS — R296 Repeated falls: Secondary | ICD-10-CM | POA: Diagnosis not present

## 2023-11-04 DIAGNOSIS — I69359 Hemiplegia and hemiparesis following cerebral infarction affecting unspecified side: Secondary | ICD-10-CM | POA: Diagnosis not present

## 2023-11-04 DIAGNOSIS — I679 Cerebrovascular disease, unspecified: Secondary | ICD-10-CM | POA: Diagnosis not present

## 2023-11-12 DIAGNOSIS — K080 Exfoliation of teeth due to systemic causes: Secondary | ICD-10-CM | POA: Diagnosis not present

## 2023-12-05 DIAGNOSIS — I69359 Hemiplegia and hemiparesis following cerebral infarction affecting unspecified side: Secondary | ICD-10-CM | POA: Diagnosis not present

## 2023-12-05 DIAGNOSIS — I679 Cerebrovascular disease, unspecified: Secondary | ICD-10-CM | POA: Diagnosis not present

## 2023-12-05 DIAGNOSIS — R296 Repeated falls: Secondary | ICD-10-CM | POA: Diagnosis not present

## 2023-12-05 DIAGNOSIS — M1711 Unilateral primary osteoarthritis, right knee: Secondary | ICD-10-CM | POA: Diagnosis not present

## 2023-12-20 DIAGNOSIS — M25561 Pain in right knee: Secondary | ICD-10-CM | POA: Diagnosis not present

## 2023-12-20 DIAGNOSIS — M25562 Pain in left knee: Secondary | ICD-10-CM | POA: Diagnosis not present

## 2024-01-03 DIAGNOSIS — I69359 Hemiplegia and hemiparesis following cerebral infarction affecting unspecified side: Secondary | ICD-10-CM | POA: Diagnosis not present

## 2024-01-03 DIAGNOSIS — R296 Repeated falls: Secondary | ICD-10-CM | POA: Diagnosis not present

## 2024-01-03 DIAGNOSIS — M1711 Unilateral primary osteoarthritis, right knee: Secondary | ICD-10-CM | POA: Diagnosis not present

## 2024-01-03 DIAGNOSIS — I679 Cerebrovascular disease, unspecified: Secondary | ICD-10-CM | POA: Diagnosis not present

## 2024-01-22 DIAGNOSIS — R7301 Impaired fasting glucose: Secondary | ICD-10-CM | POA: Diagnosis not present

## 2024-01-22 DIAGNOSIS — R058 Other specified cough: Secondary | ICD-10-CM | POA: Diagnosis not present

## 2024-01-22 DIAGNOSIS — I69354 Hemiplegia and hemiparesis following cerebral infarction affecting left non-dominant side: Secondary | ICD-10-CM | POA: Diagnosis not present

## 2024-02-02 DIAGNOSIS — I679 Cerebrovascular disease, unspecified: Secondary | ICD-10-CM | POA: Diagnosis not present

## 2024-02-02 DIAGNOSIS — M1711 Unilateral primary osteoarthritis, right knee: Secondary | ICD-10-CM | POA: Diagnosis not present

## 2024-02-02 DIAGNOSIS — I69359 Hemiplegia and hemiparesis following cerebral infarction affecting unspecified side: Secondary | ICD-10-CM | POA: Diagnosis not present

## 2024-02-02 DIAGNOSIS — R296 Repeated falls: Secondary | ICD-10-CM | POA: Diagnosis not present

## 2024-03-04 DIAGNOSIS — R296 Repeated falls: Secondary | ICD-10-CM | POA: Diagnosis not present

## 2024-03-04 DIAGNOSIS — M1711 Unilateral primary osteoarthritis, right knee: Secondary | ICD-10-CM | POA: Diagnosis not present

## 2024-03-04 DIAGNOSIS — I69359 Hemiplegia and hemiparesis following cerebral infarction affecting unspecified side: Secondary | ICD-10-CM | POA: Diagnosis not present

## 2024-03-04 DIAGNOSIS — I679 Cerebrovascular disease, unspecified: Secondary | ICD-10-CM | POA: Diagnosis not present

## 2024-03-20 DIAGNOSIS — M170 Bilateral primary osteoarthritis of knee: Secondary | ICD-10-CM | POA: Diagnosis not present

## 2024-04-03 DIAGNOSIS — R296 Repeated falls: Secondary | ICD-10-CM | POA: Diagnosis not present

## 2024-04-03 DIAGNOSIS — I679 Cerebrovascular disease, unspecified: Secondary | ICD-10-CM | POA: Diagnosis not present

## 2024-04-03 DIAGNOSIS — M1711 Unilateral primary osteoarthritis, right knee: Secondary | ICD-10-CM | POA: Diagnosis not present

## 2024-04-03 DIAGNOSIS — I69359 Hemiplegia and hemiparesis following cerebral infarction affecting unspecified side: Secondary | ICD-10-CM | POA: Diagnosis not present

## 2024-05-20 DIAGNOSIS — I69354 Hemiplegia and hemiparesis following cerebral infarction affecting left non-dominant side: Secondary | ICD-10-CM | POA: Diagnosis not present

## 2024-05-21 DIAGNOSIS — N390 Urinary tract infection, site not specified: Secondary | ICD-10-CM | POA: Diagnosis not present

## 2024-05-21 DIAGNOSIS — R350 Frequency of micturition: Secondary | ICD-10-CM | POA: Diagnosis not present

## 2024-06-01 DIAGNOSIS — R350 Frequency of micturition: Secondary | ICD-10-CM | POA: Diagnosis not present

## 2024-06-15 DIAGNOSIS — J069 Acute upper respiratory infection, unspecified: Secondary | ICD-10-CM | POA: Diagnosis not present

## 2024-06-15 DIAGNOSIS — R058 Other specified cough: Secondary | ICD-10-CM | POA: Diagnosis not present

## 2024-07-20 DIAGNOSIS — M25562 Pain in left knee: Secondary | ICD-10-CM | POA: Diagnosis not present

## 2024-07-20 DIAGNOSIS — M25561 Pain in right knee: Secondary | ICD-10-CM | POA: Diagnosis not present

## 2024-08-08 DIAGNOSIS — Z23 Encounter for immunization: Secondary | ICD-10-CM | POA: Diagnosis not present

## 2024-09-11 ENCOUNTER — Ambulatory Visit (INDEPENDENT_AMBULATORY_CARE_PROVIDER_SITE_OTHER): Admitting: Otolaryngology

## 2024-09-14 ENCOUNTER — Ambulatory Visit (INDEPENDENT_AMBULATORY_CARE_PROVIDER_SITE_OTHER): Admitting: Otolaryngology

## 2024-09-14 ENCOUNTER — Ambulatory Visit (INDEPENDENT_AMBULATORY_CARE_PROVIDER_SITE_OTHER): Payer: Self-pay | Admitting: Audiology

## 2024-09-14 VITALS — BP 179/76 | HR 84 | Temp 98.0°F | Ht 64.0 in | Wt 140.0 lb

## 2024-09-14 DIAGNOSIS — R600 Localized edema: Secondary | ICD-10-CM | POA: Diagnosis not present

## 2024-09-14 DIAGNOSIS — I10 Essential (primary) hypertension: Secondary | ICD-10-CM | POA: Diagnosis not present

## 2024-09-14 DIAGNOSIS — H903 Sensorineural hearing loss, bilateral: Secondary | ICD-10-CM | POA: Insufficient documentation

## 2024-09-14 DIAGNOSIS — H6121 Impacted cerumen, right ear: Secondary | ICD-10-CM | POA: Diagnosis not present

## 2024-09-14 DIAGNOSIS — H9201 Otalgia, right ear: Secondary | ICD-10-CM | POA: Insufficient documentation

## 2024-09-14 NOTE — Progress Notes (Signed)
 Patient ID: Nicole [name], female   DOB: 1929-11-13, 89 y.o.   MRN: [id number]  Follow up: Progressive hearing loss  Discussed the use of AI scribe software for clinical note transcription with the patient, who gave verbal consent to proceed.  History of Present Illness Nicole [name] is a 89 year old female with bilateral high frequency hearing loss who presents with worsening hearing in the right ear. She is accompanied by her daughter.  She has experienced a worsening of her hearing, particularly in the right ear, over the past few months. Her history includes bilateral high frequency hearing loss, last evaluated in September of the previous year.  She currently wears bilateral hearing aids.  She experiences discomfort when using her hearing aid, describing it as causing a dull sensation and occasional headaches. The hearing aid feels uncomfortable, especially when there is wax buildup in the ear. Her daughter assists in communicating these issues during the visit.  Exam: General: Communicates with difficulty, well nourished, no acute distress. Head: Normocephalic, no evidence injury, no tenderness, facial buttresses intact without stepoff. Face/sinus: No tenderness to palpation and percussion. Facial movement is normal and symmetric. Eyes: PERRL, EOMI. No scleral icterus, conjunctivae clear. Neuro: CN II exam reveals vision grossly intact.  No nystagmus at any point of gaze. Ears: Auricles well formed without lesions.  Right ear cerumen impaction.  The left ear canal and tympanic membrane are normal.  Nose: External evaluation reveals normal support and skin without lesions.  Dorsum is intact.  Anterior rhinoscopy reveals congested mucosa over anterior aspect of inferior turbinates and intact septum.  No purulence noted. Oral:  Oral cavity and oropharynx are intact, symmetric, without erythema or edema.  Mucosa is moist without lesions. Neck: Full range of motion without pain.  There is no significant  lymphadenopathy.  No masses palpable.  Thyroid  bed within normal limits to palpation.  Parotid glands and submandibular glands equal bilaterally without mass.  Trachea is midline. Neuro:  CN 2-12 grossly intact.    Procedure: Right ear cerumen disimpaction Anesthesia: None Description: Under the operating microscope, the cerumen is carefully removed with a combination of cerumen currette, alligator forceps, and suction catheters.  After the cerumen is removed, the TMs are noted to be normal.  No mass, erythema, or lesions. The patient tolerated the procedure well.    Assessment and Plan Assessment & Plan Bilateral sensorineural hearing loss with right-sided worsening Bilateral high-frequency sensorineural hearing loss with worsening in the right ear. No physical abnormalities in the ear canals, ear drums, or middle ear space. Hearing aid discomfort possibly due to improper fit or cerumen impaction. No infections or redness. - Scheduled hearing test with audiologist - Will evaluate hearing aid fit during hearing test - Advised to remove hearing aid if discomfort occurs and allow it to rest before reinsertion  Right ear cerumen impaction -Otomicroscopy with right ear cerumen disimpaction.

## 2024-09-15 ENCOUNTER — Ambulatory Visit (INDEPENDENT_AMBULATORY_CARE_PROVIDER_SITE_OTHER): Admitting: Pharmacist

## 2024-09-15 ENCOUNTER — Ambulatory Visit (HOSPITAL_COMMUNITY)
Admission: RE | Admit: 2024-09-15 | Discharge: 2024-09-15 | Disposition: A | Discharge status: Home / Self Care | Source: Ambulatory Visit | Attending: Surgery | Admitting: Surgery

## 2024-09-15 ENCOUNTER — Other Ambulatory Visit (HOSPITAL_COMMUNITY): Payer: Self-pay

## 2024-09-15 ENCOUNTER — Other Ambulatory Visit (HOSPITAL_COMMUNITY): Payer: Self-pay | Admitting: Adult Health

## 2024-09-15 VITALS — BP 168/80 | HR 77

## 2024-09-15 DIAGNOSIS — R600 Localized edema: Secondary | ICD-10-CM | POA: Insufficient documentation

## 2024-09-15 DIAGNOSIS — I82442 Acute embolism and thrombosis of left tibial vein: Secondary | ICD-10-CM | POA: Diagnosis not present

## 2024-09-15 MED ORDER — APIXABAN 5 MG PO TABS: 5.0000 mg | ORAL_TABLET | Freq: Two times a day (BID) | ORAL | 1 refills | Status: AC

## 2024-09-15 MED ORDER — ELIQUIS DVT/PE STARTER PACK 5 MG PO TBPK
ORAL_TABLET | ORAL | 0 refills | Status: AC
  Filled 2024-09-15: qty 74, 30d supply, fill #0

## 2024-09-15 NOTE — Patient Instructions (Signed)
-  Start apixaban  (Eliquis ) 10 mg twice daily for 7 days followed by 5 mg twice daily. -Your refills have been sent to [facility]. You may need to call the pharmacy to ask them to fill this when you start to run low on your current supply.  - Call Dr. Shepard to determine if you should continue taking Plavix  while on Eliquis  or hold while you are on Eliquis .  -It is important to take your medication around the same time every day.  -Avoid NSAIDs like ibuprofen (Advil, Motrin) and naproxen (Aleve) as well as aspirin  doses over 100 mg daily. -Tylenol  (acetaminophen ) is the preferred over the counter pain medication to lower the risk of bleeding. -Be sure to alert all of your health care providers that you are taking an anticoagulant prior to starting a new medication or having a procedure. -Monitor for signs and symptoms of bleeding (abnormal bruising, prolonged bleeding, nose bleeds, bleeding from gums, discolored urine, black tarry stools). If you have fallen and hit your head OR if your bleeding is severe or not stopping, seek emergency care.  -Go to the emergency room if emergent signs and symptoms of new clot occur (new or worse swelling and pain in an arm or leg, shortness of breath, chest pain, fast or irregular heartbeats, lightheadedness, dizziness, fainting, coughing up blood) or if you experience a significant color change (pale or blue) in the extremity that has the DVT.  -We recommend you wear compression stockings (20-30 mmHg) as long as you are having swelling or pain. Be sure to purchase the correct size and take them off at night.   If you have any questions or need to reschedule an appointment, please call [phone]. If you are having an emergency, call 911 or present to the nearest emergency room.   What is a DVT?  -Deep vein thrombosis (DVT) is a condition in which a blood clot forms in a vein of the deep venous system which can occur in the lower leg, thigh, pelvis, arm, or neck.  This condition is serious and can be life-threatening if the clot travels to the arteries of the lungs and causing a blockage (pulmonary embolism, PE). A DVT can also damage veins in the leg, which can lead to long-term venous disease, leg pain, swelling, discoloration, and ulcers or sores (post-thrombotic syndrome).  -Treatment may include taking an anticoagulant medication to prevent more clots from forming and the current clot from growing, wearing compression stockings, and/or surgical procedures to remove or dissolve the clot.

## 2024-09-15 NOTE — Progress Notes (Signed)
 DVT Clinic Note  Name: Nicole [name]     MRN: [id number]     DOB: Mar 12, 1930     Sex: female  PCP: Shepard Ade, MD  Today's Visit: Visit Information: Initial Visit  Referred to DVT Clinic by: Primary Care - Corean Bohr, NP  Referred to CPP by: Dr. Gretta Reason for referral:  Chief Complaint  Patient presents with   DVT   HISTORY OF PRESENT ILLNESS: Nicole [name] is a 89 y.o. female who presents after diagnosis of DVT for medication management. Patient has PMH of HTN, HLD, hypothyroidism, and left-sided hemiparesis post cerebrovascular accident who started experiencing swelling and pain in left lower extremity on 09/13/24. Was seen at her PCP's office who referred her for an ultrasound to rule out DVT. Patient was found to have acute DVT in left posterior tibial veins 09/15/24.   Today, patient reports swelling in left leg. Patient and daughter report the patient has had swelling in her left leg that waxes and wanes since having a stroke in 2021 but starting on 09/13/24 the swelling became persistent. Denies wearing compression stockings or elevating her leg at home. Denies pain in left leg. Patient and daughter report the patient has a very sedentary lifestyle and is wheelchair bound. They report the patient had a fall out of her wheelchair a couple weeks ago which left a bruise on her back. She cannot recall if she injured her left leg during the fall. Denies prior history of DVT.  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, sedentary lifestyle) Bleeding Risk Factors: Age >65 years, Prior stroke, Antiplatelet therapy  Negative Thrombotic Risk Factors: Previous VTE, Recent surgery (within 3 months), Recent trauma (within 3 months), Recent admission to hospital with acute illness (within 3 months), Paralysis, paresis, or recent plaster cast immobilization of lower extremity, Central venous catheterization, Sedentary journey lasting >8 hours within  4 weeks, Bed rest >72 hours within 3 months, Within 6 weeks postpartum, Recent cesarean section (within 3 months), Estrogen therapy, Pregnancy, Recent COVID diagnosis (within 3 months), Erythropoiesis-stimulating agent, Testosterone therapy, Active cancer, Non-malignant, chronic inflammatory condition, Known thrombophilic condition, Smoking, Obesity  Rx Insurance Coverage: Medicare Rx Affordability: Eliquis  is $4 for 30 day supply  Rx Assistance Provided: None needed Preferred Pharmacy: Initial starter pack filled at [facility] during her appointment. Refills sent to [facility] per patient preference.  Past Medical History:  Diagnosis Date   Hypertension    Stroke [facility])    September 2021   Thyroid  disease     No past surgical history on file.  Social History   Socioeconomic History   Marital status: Single    Spouse name: Not on file   Number of children: Not on file   Years of education: Not on file   Highest education level: Not on file  Occupational History   Not on file  Tobacco Use   Smoking status: Never   Smokeless tobacco: Never  Vaping Use   Vaping status: Never Used  Substance and Sexual Activity   Alcohol  use: No    Alcohol /week: 0.0 standard drinks of alcohol    Drug use: No   Sexual activity: Not on file  Other Topics Concern   Not on file  Social History Narrative   Lives with daughter   Right handed   Drinks 1-2 cups caffeine daily   Social Drivers of Corporate Investment Banker Strain: Not on file  Food Insecurity: Not on file  Transportation Needs:  Not on file  Physical Activity: Not on file  Stress: Not on file  Social Connections: Not on file  Intimate Partner Violence: Not on file    Family History  Problem Relation Age of Onset   Diabetes Mother    Stroke Mother    Diabetes Other    Hypertension Other    Breast cancer Neg Hx     Allergies as of 09/15/2024   (No Known Allergies)    Current Outpatient Medications on File Prior  to Visit  Medication Sig Dispense Refill   acetaminophen  (TYLENOL ) 325 MG tablet Take 2 tablets (650 mg total) by mouth every 8 (eight) hours.     albuterol  (VENTOLIN  HFA) 108 (90 Base) MCG/ACT inhaler INHALE 1 PUFF INTO THE LUNGS EVERY FOUR HOURS AS NEEDED FOR WHEEZING OR SHORTNESS OF BREATH. 8.5 g 0   baclofen  (LIORESAL ) 10 MG tablet Take 1 tablet (10 mg total) by mouth 4 (four) times daily. Take 1 pill in AM and 1 in afternoon and 1.5 to 2 pills at night- for spasticity 120 each 5   Calcium  Carb-Cholecalciferol  (CALCIUM  + D3) 600-800 MG-UNIT TABS Take 1 tablet by mouth 2 (two) times daily.     cetirizine  (ZYRTEC ) 10 MG tablet Take 1 tablet (10 mg total) by mouth daily as needed for allergies or rhinitis.     clopidogrel  (PLAVIX ) 75 MG tablet Take 1 tablet (75 mg total) by mouth daily. 90 tablet 3   hydrochlorothiazide  (HYDRODIURIL ) 25 MG tablet Take 25 mg by mouth daily.     levothyroxine  (SYNTHROID ) 50 MCG tablet Take 1 tablet (50 mcg total) by mouth daily before breakfast. 30 tablet 0   lisinopril  (ZESTRIL ) 20 MG tablet TAKE 1 TABLET (20 MG TOTAL) BY MOUTH DAILY. 30 tablet 0   oxybutynin  (DITROPAN -XL) 5 MG 24 hr tablet Take 5 mg by mouth daily.     pantoprazole  (PROTONIX ) 40 MG tablet Take 40 mg by mouth 2 (two) times daily as needed.     polyethylene glycol (MIRALAX  / GLYCOLAX ) 17 g packet Take 17 g by mouth daily as needed. 14 each 0   rosuvastatin  (CRESTOR ) 40 MG tablet Take 1 tablet (40 mg total) by mouth daily. 30 tablet 0   vitamin B-12 (CYANOCOBALAMIN ) 250 MCG tablet Take 1 tablet (250 mcg total) by mouth daily. 30 tablet 0   amLODipine  (NORVASC ) 10 MG tablet Take 10 mg by mouth daily. (Patient not taking: Reported on 09/15/2024)     No current facility-administered medications on file prior to visit.   REVIEW OF SYSTEMS:  Review of Systems  Respiratory:  Negative for shortness of breath.   Cardiovascular:  Positive for leg swelling. Negative for chest pain.  All other systems  reviewed and are negative.  PHYSICAL EXAMINATION:  Vitals:   09/15/24 1314  BP: (!) 168/80  Pulse: 77  SpO2: 97%    There is no height or weight on file to calculate BMI.  Physical Exam Vitals reviewed.  Pulmonary:     Effort: Pulmonary effort is normal.  Musculoskeletal:     Left lower leg: Edema present.  Neurological:     Mental Status: She is alert.  Psychiatric:        Mood and Affect: Mood normal.    Villalta Score for Post-Thrombotic Syndrome: Pain: Absent Cramps: Absent Heaviness: Absent Paresthesia: Absent Pruritus: Absent Pretibial Edema: Mild Skin Induration: Absent Hyperpigmentation: Absent Redness: Absent Venous Ectasia: Absent Pain on calf compression: Absent Villalta Preliminary Score: 1 Is venous ulcer present?:  No If venous ulcer is present and score is <15, then 15 points total are assigned: Absent Villalta Total Score: 1  LABS:  CBC     Component Value Date/Time   WBC 7.3 03/20/2021 1219   RBC 4.16 03/20/2021 1219   HGB 12.9 03/20/2021 1219   HCT 40.8 03/20/2021 1219   PLT 220 03/20/2021 1219   MCV 98.1 03/20/2021 1219   MCH 31.0 03/20/2021 1219   MCHC 31.6 03/20/2021 1219   RDW 12.6 03/20/2021 1219   LYMPHSABS 2.2 03/20/2021 1219   MONOABS 0.5 03/20/2021 1219   EOSABS 0.1 03/20/2021 1219   BASOSABS 0.1 03/20/2021 1219    Hepatic Function      Component Value Date/Time   PROT 8.5 (H) 07/29/2020 1251   ALBUMIN 3.9 07/29/2020 1251   AST 30 07/29/2020 1251   ALT 27 07/29/2020 1251   ALKPHOS 61 07/29/2020 1251   BILITOT 1.2 07/29/2020 1251    Renal Function   Lab Results  Component Value Date   CREATININE 0.79 03/20/2021   CREATININE 0.90 08/15/2020   CREATININE 0.98 08/11/2020    CrCl cannot be calculated (Patient's most recent lab result is older than the maximum 21 days allowed.).   VVS Vascular Lab Studies:  09/15/24 VAS US  LOWER EXTREMITY  VENOUS +---------+---------------+---------+-----------+----------+--------------+   LEFT    CompressibilityPhasicitySpontaneityPropertiesThrombus  Aging  +---------+---------------+---------+-----------+----------+--------------+   CFV     Full           Yes      Yes                                   +---------+---------------+---------+-----------+----------+--------------+   SFJ     Full                                                          +---------+---------------+---------+-----------+----------+--------------+   FV Prox  Full                                                          +---------+---------------+---------+-----------+----------+--------------+   FV Mid   Full           Yes      Yes                                   +---------+---------------+---------+-----------+----------+--------------+   FV DistalFull                                                          +---------+---------------+---------+-----------+----------+--------------+   PFV     Full                                                          +---------+---------------+---------+-----------+----------+--------------+  POP     Full           Yes      Yes                                   +---------+---------------+---------+-----------+----------+--------------+   PTV     None                    No                   Acute            +---------+---------------+---------+-----------+----------+--------------+   PERO    Full                    Yes                                   +---------+---------------+---------+-----------+----------+--------------+  Summary:  RIGHT:  - No evidence of common femoral vein obstruction.    LEFT:  - Findings consistent with acute deep vein thrombosis involving the left  posterior tibial veins.  - No cystic structure found in the popliteal fossa.   ASSESSMENT: Location of DVT:  Left distal vein Cause of DVT: provoked by a persistent risk factor  Patient with no history of DVT found to have an acute DVT in left posterior tibial veins. Patient and daughter endorse sedentary lifestyle with recent fall. Unable to recall if she injured her left leg during the fall. Patient has swelling in LLE that is not painful on exam. Will start patient on Eliquis  VTE starter pack dosing with plan to treat for at least 3 months. No dose adjustment needed for VTE indication. Will have the patient follow-up with PCP for risk-benefit discussion surrounding treatment past 3 months given immobility is a persistent risk factor. She is also higher bleeding risk due to age, antiplatelet therapy, and history of stroke. She is currently taking Plavix  at home. Counseled to contact PCP to determine if she should continue the Plavix  while on Eliquis . No recent labs available so will obtain updated CMET and CBC today. Patient and daughter counseled on compression stocking use and elevation. All patient and daughters questions answered. No barriers to adherence identified today.   PLAN: -Start apixaban  (Eliquis ) 10 mg twice daily for 7 days followed by 5 mg twice daily. -Expected duration of therapy: at least 3 months. Therapy started on 09/15/24. -Patient educated on purpose, proper use and potential adverse effects of apixaban  (Eliquis ). -Discussed importance of taking medication around the same time every day. -Advised patient of medications to avoid (NSAIDs, aspirin  doses >100 mg daily). -Educated that Tylenol  (acetaminophen ) is the preferred analgesic to lower the risk of bleeding. -Advised patient to alert all providers of anticoagulation therapy prior to starting a new medication or having a procedure. -Emphasized importance of monitoring for signs and symptoms of bleeding (abnormal bruising, prolonged bleeding, nose bleeds, bleeding from gums, discolored urine, black tarry stools). -Educated patient  to present to the ED if emergent signs and symptoms of new thrombosis occur. -Measured patient for compression stockings. -Counseled patient to wear compression stockings daily, removing at night.  Follow up: with PCP for further management   Duwaine Dry Student Pharmacist  09/15/2024,2:39 PM  Izetta Henry, PharmD [facility] [phone]

## 2024-09-15 NOTE — Progress Notes (Unsigned)
  [address], [address] [place], [place] [address] [phone]  Hearing Aid Check     Nicole [name] comes for a scheduled appointment for a hearing aid check.    Right Left  Hearing aid manufacturer    Hearing aid style    Hearing aid battery    Receiver    Dome/ custom earpiece    Retention wire    Warranty expiration date    Loss and Damage    Additional accessories Expiration date    Initial fitting date    Device was fit at:    Hearing aid services Bundled until:  Bundled until:     Chief complaint: Patient reports ***.  Actions taken: Inspection of the device and listening check showed that ***.  Services fee: $*** was paid at checkout.  Patient was oriented about ***.  Recommend: Return for a hearing aid check ***. Return for a hearing evaluation and to see an ENT, if concerns with hearing changes arise. ***  Adelina Collard MARIE LEROUX-MARTINEZ, AUD

## 2024-09-16 ENCOUNTER — Ambulatory Visit: Payer: Self-pay | Admitting: Pharmacist

## 2024-09-16 LAB — CBC
Hematocrit: 41.6 % (ref 34.0–46.6)
Hemoglobin: 12.9 g/dL (ref 11.1–15.9)
MCH: 31 pg (ref 26.6–33.0)
MCHC: 31 g/dL — ABNORMAL LOW (ref 31.5–35.7)
MCV: 100 fL — ABNORMAL HIGH (ref 79–97)
Platelets: 143 x10E3/uL — ABNORMAL LOW (ref 150–450)
RBC: 4.16 x10E6/uL (ref 3.77–5.28)
RDW: 11.5 % — ABNORMAL LOW (ref 11.7–15.4)
WBC: 10.6 x10E3/uL (ref 3.4–10.8)

## 2024-09-16 LAB — COMPREHENSIVE METABOLIC PANEL WITH GFR
ALT: 16 IU/L (ref 0–32)
AST: 26 IU/L (ref 0–40)
Albumin: 4.5 g/dL (ref 3.6–4.6)
Alkaline Phosphatase: 61 IU/L (ref 48–129)
BUN/Creatinine Ratio: 18 (ref 12–28)
BUN: 16 mg/dL (ref 10–36)
Bilirubin Total: 0.8 mg/dL (ref 0.0–1.2)
CO2: 20 mmol/L (ref 20–29)
Calcium: 9.9 mg/dL (ref 8.7–10.3)
Chloride: 101 mmol/L (ref 96–106)
Creatinine, Ser: 0.88 mg/dL (ref 0.57–1.00)
Globulin, Total: 3.2 g/dL (ref 1.5–4.5)
Glucose: 90 mg/dL (ref 70–99)
Potassium: 4.1 mmol/L (ref 3.5–5.2)
Sodium: 139 mmol/L (ref 134–144)
Total Protein: 7.7 g/dL (ref 6.0–8.5)
eGFR: 62 mL/min/1.73 (ref >59)

## 2024-10-07 DIAGNOSIS — R82998 Other abnormal findings in urine: Secondary | ICD-10-CM | POA: Diagnosis not present

## 2024-10-07 DIAGNOSIS — D509 Iron deficiency anemia, unspecified: Secondary | ICD-10-CM | POA: Diagnosis not present

## 2024-10-16 ENCOUNTER — Other Ambulatory Visit (INDEPENDENT_AMBULATORY_CARE_PROVIDER_SITE_OTHER): Payer: Self-pay | Admitting: Otolaryngology

## 2024-10-16 DIAGNOSIS — H903 Sensorineural hearing loss, bilateral: Secondary | ICD-10-CM

## 2024-10-23 ENCOUNTER — Ambulatory Visit (INDEPENDENT_AMBULATORY_CARE_PROVIDER_SITE_OTHER): Admitting: Audiology

## 2024-10-23 ENCOUNTER — Ambulatory Visit (INDEPENDENT_AMBULATORY_CARE_PROVIDER_SITE_OTHER): Payer: Self-pay | Admitting: Audiology

## 2024-10-23 DIAGNOSIS — H903 Sensorineural hearing loss, bilateral: Secondary | ICD-10-CM | POA: Diagnosis not present

## 2024-10-23 NOTE — Progress Notes (Signed)
" °  [address] [place], [place] [address] [phone]  Hearing Aid Check     MARIKAY ROADS comes for a scheduled appointment for a hearing aid check.    Accompanied ab:[id number]   Right Left  Hearing aid manufacturer Resound [place] 5 mini SN: [id number] Clancy Griffith 5 mini SN: [id number]  Hearing aid style Receiver in the canal Receiver in the canal  Hearing aid battery Rechargeable Rechargeable  Receiver    Dome/ custom earpiece Small closed  Small closed   Retention wire yes No ( does not fit properly)  Warranty expiration date 02/06/2025 02/06/2025  Loss and Damage To be determined To be determined  Additional accessories Expiration date    Initial fitting date 01-19-2022 01-19-2022  Device was fit at: [facility]       Actions taken: Inspection of the device and listening check showed that both are working. Cleaned both aids.  Speech Mapping completed at soft, average, loud and MPO. Results within NAL-NL2 target. Today a rentention wire was added to the right side.  Patient was asked to push the hearing aids further into her ears because the aids were not inserted correctly and sticking out when she first came into the clinic.  Services fee: $0 was paid at checkout.  Recommend: Return for a hearing evaluation and to see an ENT, if concerns with hearing changes arise.  Cletus Mehlhoff MARIE LEROUX-MARTINEZ, AUD "

## 2024-10-23 NOTE — Progress Notes (Signed)
" °  [address] [place], [place] [address] [phone]  Audiological Evaluation    Name: Nicole [name]     DOB:   [birth year]      MRN:   [id number]                                                                                     Service Date: 10/23/2024     Accompanied by: friend   Patient comes today after Dr. Karis, ENT sent a referral for a hearing evaluation due to concerns with hearing loss.  Follow-up to determine hearing status.  Patient received hearing aids in the past [facility]   Symptoms Yes Details  Hearing loss  [x]  Patient had a previous hearing aid on 05/20/2023 and would like to verify if hearing has changed.    Tinnitus  []    Ear pain/ infections/pressure  []    Balance problems  []    Noise exposure history  []    Previous ear surgeries  []    Family history of hearing loss  []    Amplification  []    Other  []      Otoscopy: Right ear: Clear external ear canal and notable landmarks visualized on the tympanic membrane. Left ear:  Clear external ear canal and notable landmarks visualized on the tympanic membrane.  Tympanometry: Right ear: Type A - Normal external ear canal volume with normal middle ear pressure and normal tympanic membrane compliance. Findings are consistent with normal middle ear function. Left ear: Type A - Normal external ear canal volume with normal middle ear pressure and normal tympanic membrane compliance. Findings are consistent with normal middle ear function.  Hearing Evaluation The hearing test results were completed under headphones and results are deemed to be of good reliability. Test technique:  conventional    Pure tone Audiometry: Both ears:  Mild sloping to moderately-severe sensorineural hearing loss, 125 Hz - 8000 Hz  Speech Audiometry: Right ear- Speech Reception Threshold (SRT) was obtained at 50 dBHL. Left ear-Speech Reception Threshold (SRT) was obtained at 45 dBHL.   Word Recognition Score Tested  using NU-6 (recorded) Right ear: 20% was obtained at a presentation level of 90 dBHL with contralateral masking which is deemed as  poor. Left ear: 52% was obtained at a presentation level of 90 dBHL with contralateral masking which is deemed as  poor.   Impression: There is not a significant difference in pure-tone thresholds between ears. There is a significant difference in word recognition scores , worse of the right ear.  Both word recognition scores are very poor but the right ear is worse.   Recommendations: Follow up with ENT as needed. Return for a hearing evaluation if concerns with hearing changes arise or per MD recommendation.   [name], AUD  "

## 2025-03-19 ENCOUNTER — Ambulatory Visit (INDEPENDENT_AMBULATORY_CARE_PROVIDER_SITE_OTHER): Admitting: Otolaryngology
